# Patient Record
Sex: Male | Born: 1960 | Race: Black or African American | Hispanic: No | Marital: Single | Smoking: Never smoker
Health system: Southern US, Community
[De-identification: ages and names within clinical notes are randomized; demographics above are authoritative.]

## PROBLEM LIST (undated history)

## (undated) ENCOUNTER — Emergency Department (HOSPITAL_COMMUNITY): Admission: EM | Payer: Self-pay

## (undated) DIAGNOSIS — E1169 Type 2 diabetes mellitus with other specified complication: Secondary | ICD-10-CM

## (undated) DIAGNOSIS — E669 Obesity, unspecified: Secondary | ICD-10-CM

## (undated) DIAGNOSIS — R601 Generalized edema: Secondary | ICD-10-CM

## (undated) DIAGNOSIS — E11621 Type 2 diabetes mellitus with foot ulcer: Secondary | ICD-10-CM

## (undated) DIAGNOSIS — L0390 Cellulitis, unspecified: Secondary | ICD-10-CM

## (undated) DIAGNOSIS — F1010 Alcohol abuse, uncomplicated: Secondary | ICD-10-CM

## (undated) DIAGNOSIS — L089 Local infection of the skin and subcutaneous tissue, unspecified: Secondary | ICD-10-CM

## (undated) DIAGNOSIS — I10 Essential (primary) hypertension: Secondary | ICD-10-CM

---

## 1999-05-07 ENCOUNTER — Emergency Department (HOSPITAL_COMMUNITY): Admission: EM | Admit: 1999-05-07 | Discharge: 1999-05-08 | Payer: Self-pay | Admitting: Emergency Medicine

## 1999-05-08 ENCOUNTER — Emergency Department (HOSPITAL_COMMUNITY): Admission: EM | Admit: 1999-05-08 | Discharge: 1999-05-08 | Payer: Self-pay | Admitting: Emergency Medicine

## 1999-07-21 ENCOUNTER — Emergency Department (HOSPITAL_COMMUNITY): Admission: EM | Admit: 1999-07-21 | Discharge: 1999-07-21 | Payer: Self-pay | Admitting: Emergency Medicine

## 1999-07-22 ENCOUNTER — Emergency Department (HOSPITAL_COMMUNITY): Admission: EM | Admit: 1999-07-22 | Discharge: 1999-07-22 | Payer: Self-pay | Admitting: Emergency Medicine

## 1999-07-26 ENCOUNTER — Emergency Department (HOSPITAL_COMMUNITY): Admission: EM | Admit: 1999-07-26 | Discharge: 1999-07-26 | Payer: Self-pay | Admitting: Emergency Medicine

## 1999-07-28 ENCOUNTER — Emergency Department (HOSPITAL_COMMUNITY): Admission: EM | Admit: 1999-07-28 | Discharge: 1999-07-29 | Payer: Self-pay | Admitting: Emergency Medicine

## 1999-07-29 ENCOUNTER — Encounter: Payer: Self-pay | Admitting: Emergency Medicine

## 2000-11-19 ENCOUNTER — Emergency Department (HOSPITAL_COMMUNITY): Admission: EM | Admit: 2000-11-19 | Discharge: 2000-11-19 | Payer: Self-pay

## 2000-11-22 ENCOUNTER — Emergency Department (HOSPITAL_COMMUNITY): Admission: EM | Admit: 2000-11-22 | Discharge: 2000-11-22 | Payer: Self-pay | Admitting: Emergency Medicine

## 2000-12-18 ENCOUNTER — Emergency Department (HOSPITAL_COMMUNITY): Admission: EM | Admit: 2000-12-18 | Discharge: 2000-12-18 | Payer: Self-pay | Admitting: Emergency Medicine

## 2001-03-19 ENCOUNTER — Emergency Department (HOSPITAL_COMMUNITY): Admission: EM | Admit: 2001-03-19 | Discharge: 2001-03-19 | Payer: Self-pay | Admitting: Emergency Medicine

## 2001-03-20 ENCOUNTER — Encounter: Payer: Self-pay | Admitting: Internal Medicine

## 2001-03-20 ENCOUNTER — Emergency Department (HOSPITAL_COMMUNITY): Admission: EM | Admit: 2001-03-20 | Discharge: 2001-03-20 | Payer: Self-pay | Admitting: Internal Medicine

## 2001-04-18 ENCOUNTER — Emergency Department (HOSPITAL_COMMUNITY): Admission: EM | Admit: 2001-04-18 | Discharge: 2001-04-18 | Payer: Self-pay | Admitting: Emergency Medicine

## 2001-04-20 ENCOUNTER — Emergency Department (HOSPITAL_COMMUNITY): Admission: EM | Admit: 2001-04-20 | Discharge: 2001-04-20 | Payer: Self-pay | Admitting: Emergency Medicine

## 2005-12-20 ENCOUNTER — Emergency Department (HOSPITAL_COMMUNITY): Admission: EM | Admit: 2005-12-20 | Discharge: 2005-12-20 | Payer: Self-pay | Admitting: Emergency Medicine

## 2005-12-27 ENCOUNTER — Emergency Department (HOSPITAL_COMMUNITY): Admission: EM | Admit: 2005-12-27 | Discharge: 2005-12-27 | Payer: Self-pay | Admitting: *Deleted

## 2010-06-15 ENCOUNTER — Emergency Department (HOSPITAL_COMMUNITY): Admission: EM | Admit: 2010-06-15 | Discharge: 2010-06-15 | Payer: Self-pay | Admitting: Emergency Medicine

## 2010-06-15 IMAGING — CR DG FINGER THUMB 2+V*R*
3 series · 3 of 3 positions shown · non-contrast
Comparison: None

CLINICAL DATA: Swelling right thumb

RIGHT THUMB 2+V

[x finger pa right]
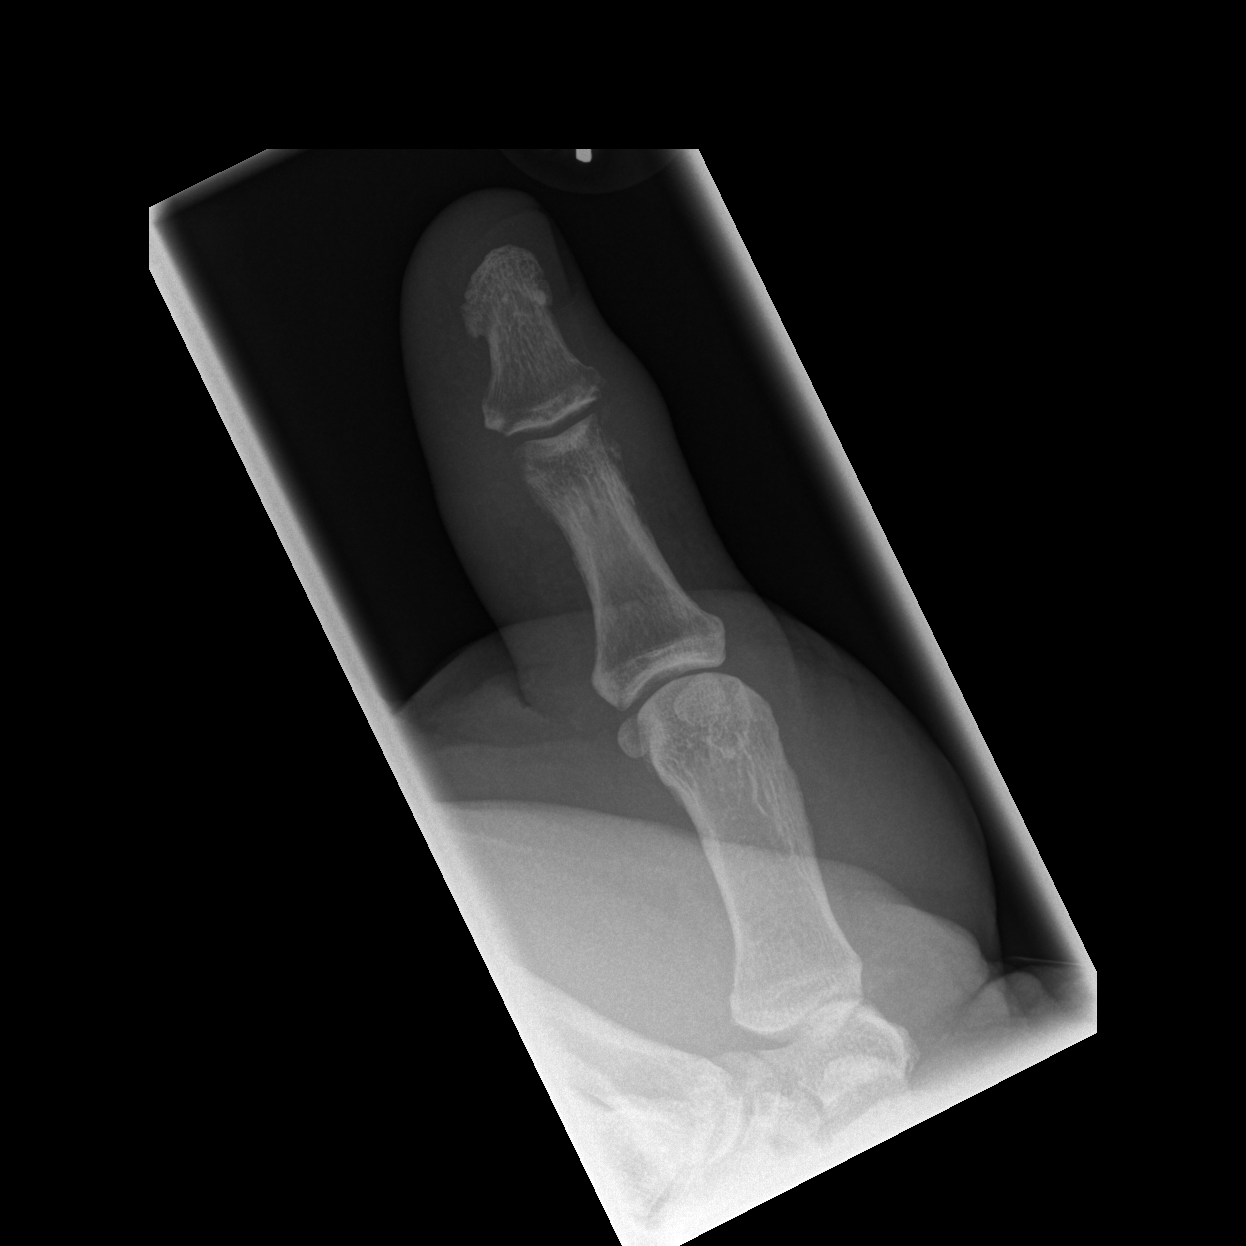

[x finger obl. right]
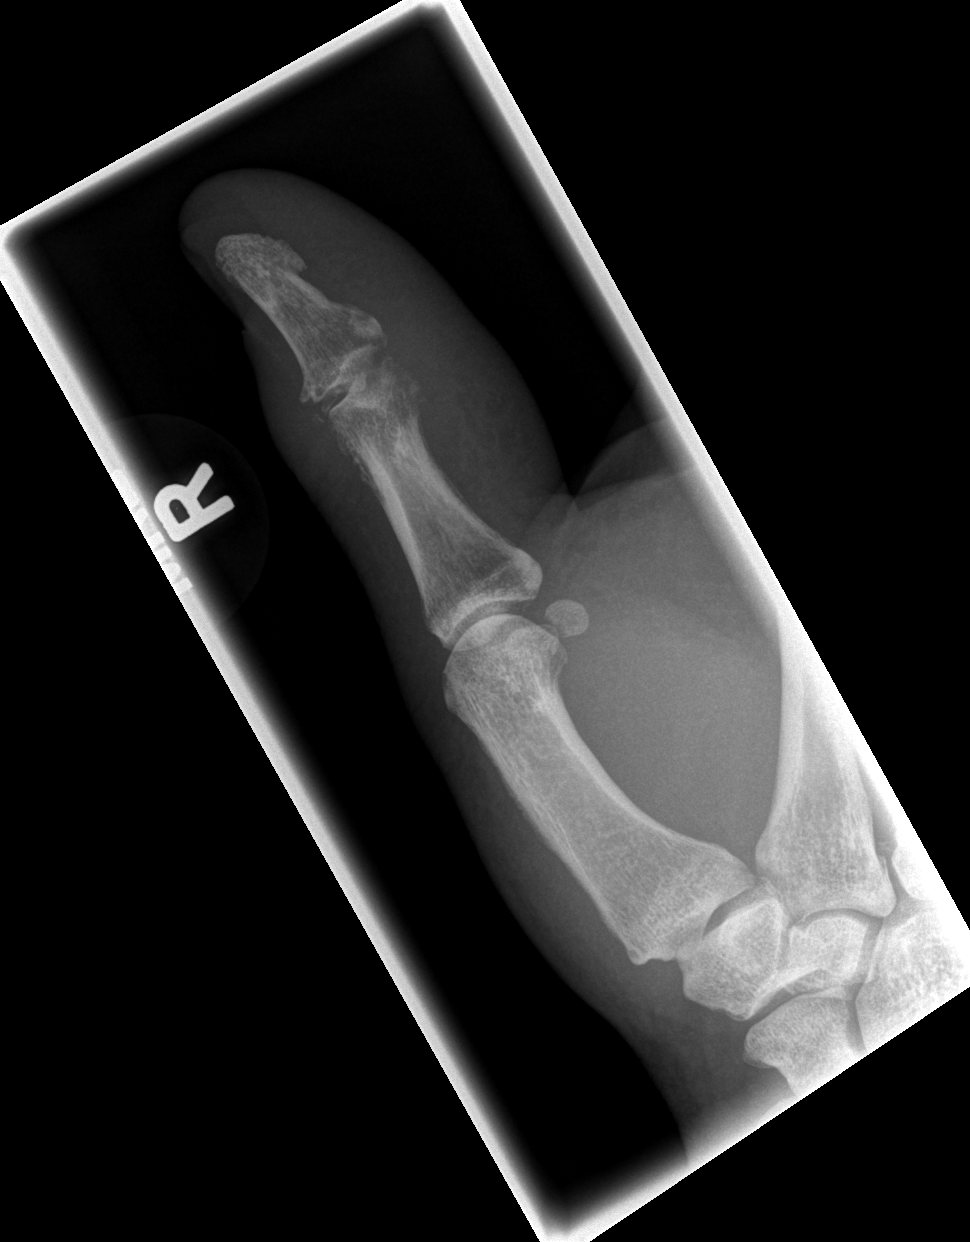

[x finger lateral right]
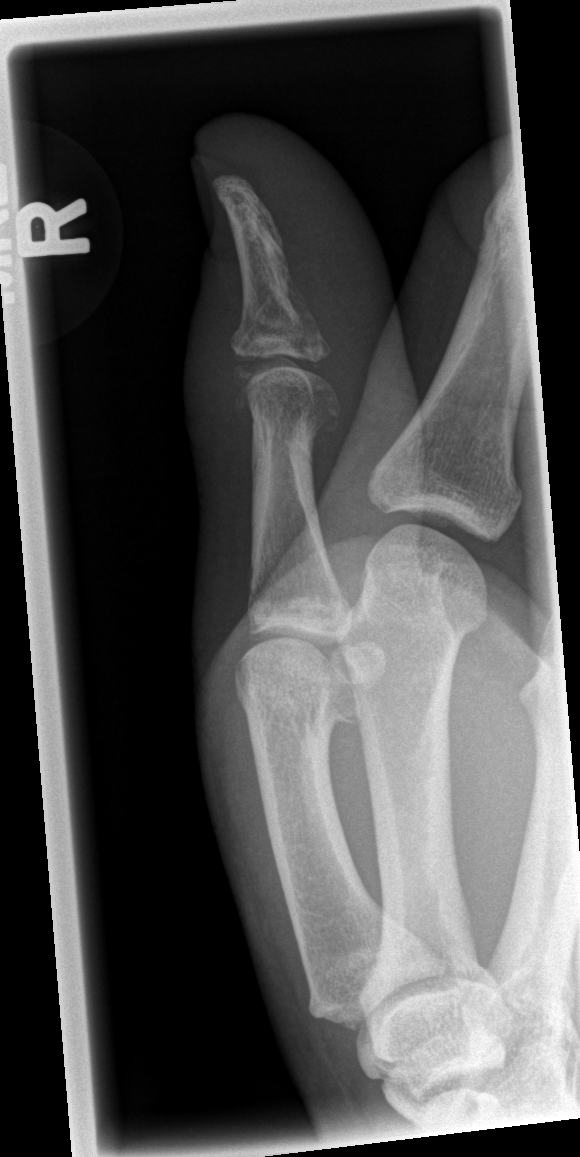

[3 of 3 positions shown; findings below may reference images not displayed]

FINDINGS: Joint spaces preserved.
Small ossicles versus loose bodies at IP joint right thumb.
No acute fracture or dislocation.
On the oblique view, there is questionable loss of the cortical
margin at the ulnar aspect at the head of the proximal phalanx:
osteomyelitis / bone destruction not excluded.
Soft tissue swelling right thumb, greatest at IP joint.
IMPRESSION: Soft tissue swelling and degenerative changes IP joint right thumb.
Cannot exclude osteomyelitis at head of proximal phalanx right
thumb; consider follow-up MR imaging.

## 2011-03-30 ENCOUNTER — Emergency Department (HOSPITAL_COMMUNITY)
Admission: EM | Admit: 2011-03-30 | Discharge: 2011-03-30 | Discharge status: Against Medical Advice | Payer: Self-pay | Attending: Emergency Medicine | Admitting: Emergency Medicine

## 2011-03-31 ENCOUNTER — Emergency Department (HOSPITAL_COMMUNITY): Payer: Self-pay

## 2011-03-31 ENCOUNTER — Emergency Department (HOSPITAL_COMMUNITY)
Admission: EM | Admit: 2011-03-31 | Discharge: 2011-03-31 | Disposition: A | Discharge status: Home / Self Care | Payer: Self-pay | Attending: Emergency Medicine | Admitting: Emergency Medicine

## 2011-03-31 DIAGNOSIS — M7020 Olecranon bursitis, unspecified elbow: Secondary | ICD-10-CM | POA: Insufficient documentation

## 2011-03-31 DIAGNOSIS — R609 Edema, unspecified: Secondary | ICD-10-CM | POA: Insufficient documentation

## 2011-03-31 IMAGING — CR DG ELBOW COMPLETE 3+V*R*
4 series · 4 of 4 positions shown · non-contrast
Comparison: None.

CLINICAL DATA: Severe elbow pain and swelling for 5 days.  No acute
injury.

RIGHT ELBOW - COMPLETE 3+ VIEW

[x elbow joint ap right]
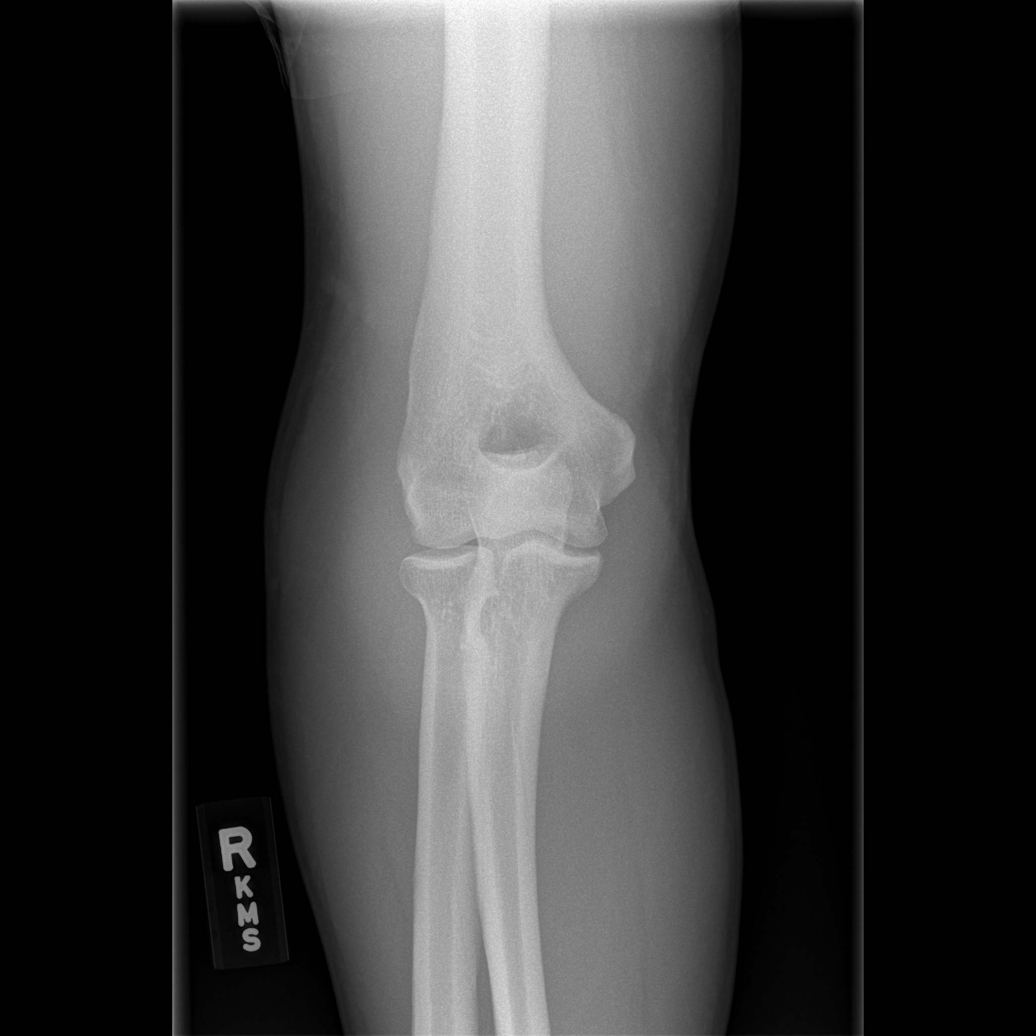

[x elbow joint obl. right]
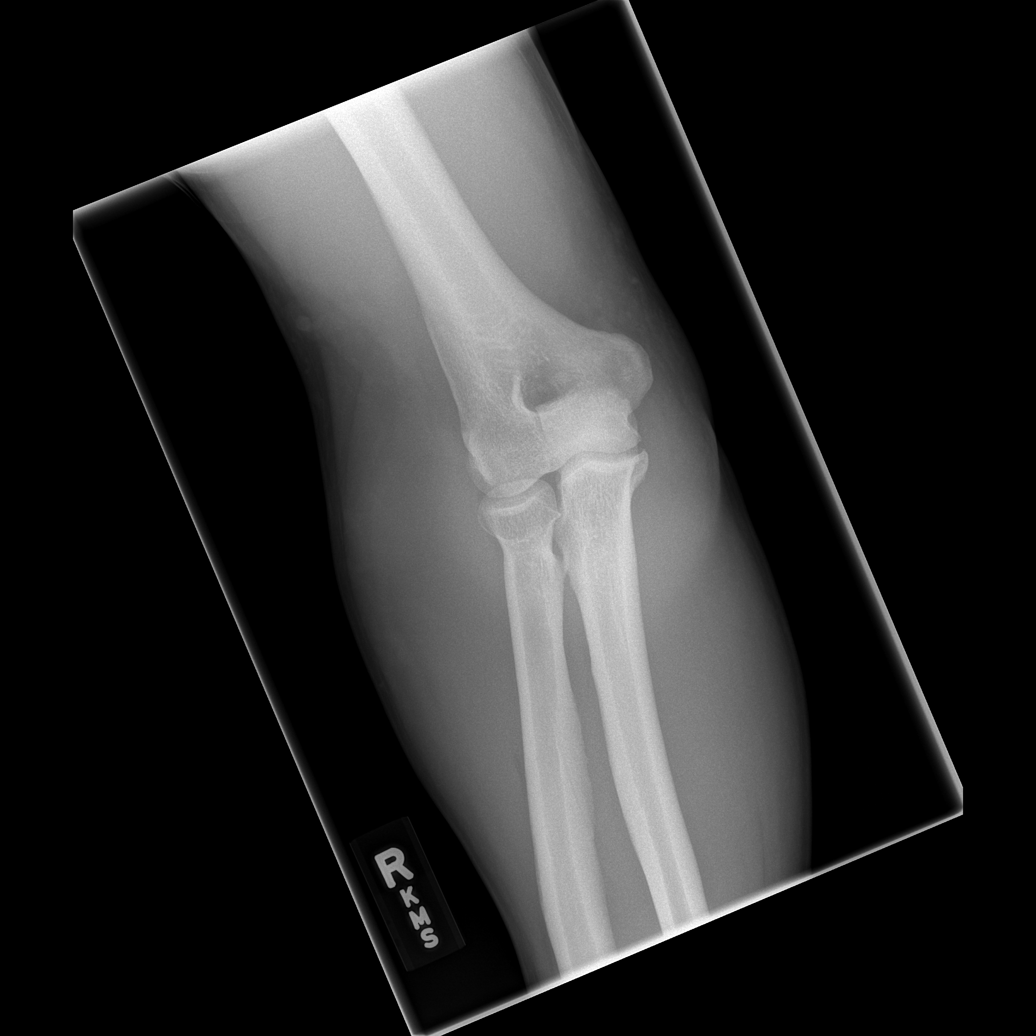

[x elbow joint lat right (1 of 2)]
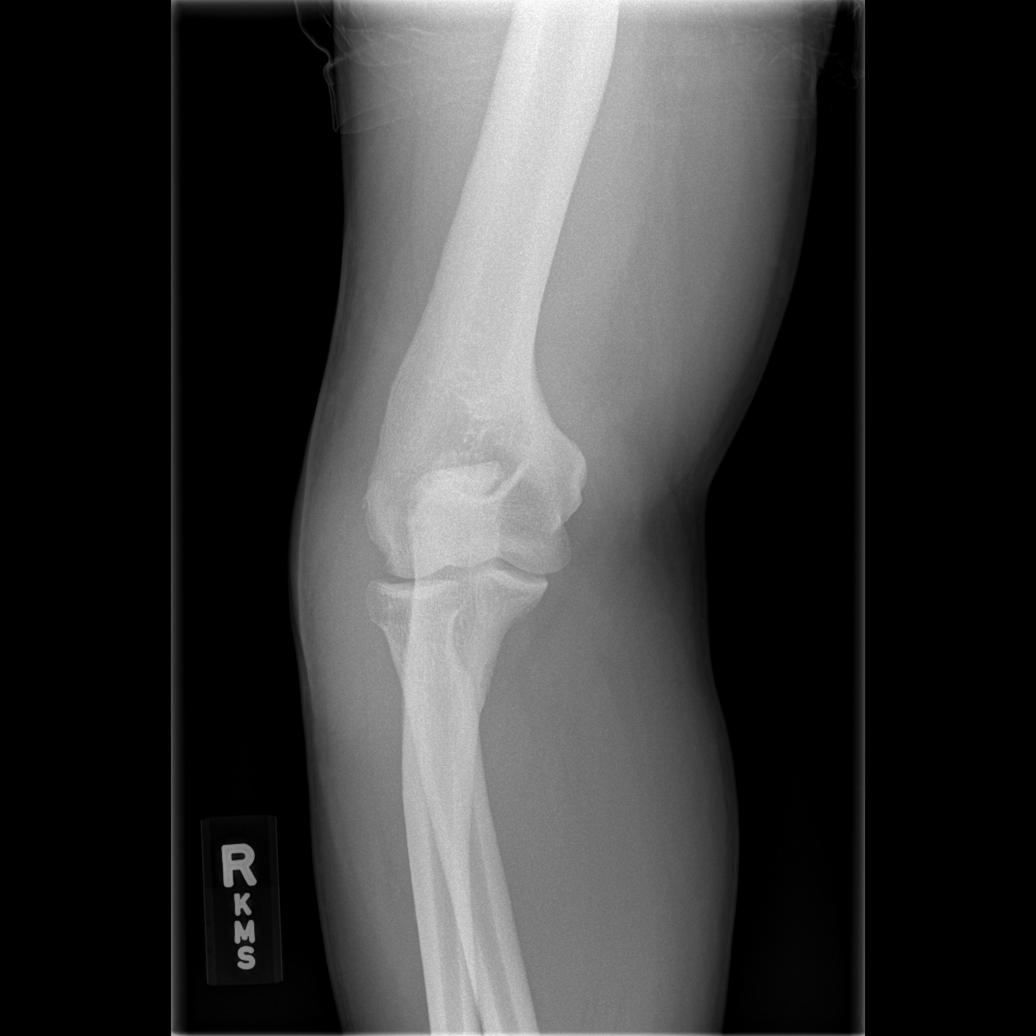

[x elbow joint lat right (2 of 2)]
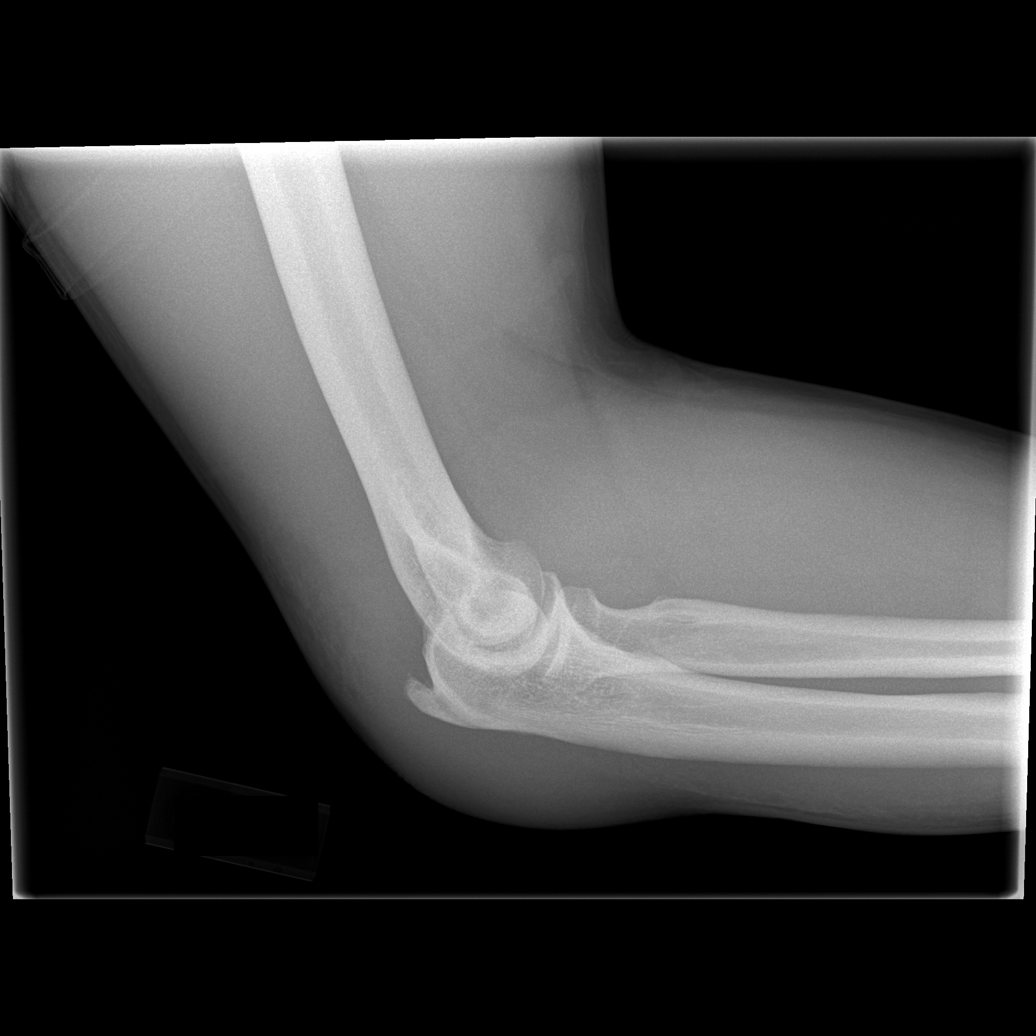

[4 of 4 positions shown; findings below may reference images not displayed]

FINDINGS: There is marked soft tissue swelling dorsally over the
olecranon process.  There is prominent spurring at the triceps
insertion on the olecranon.  No acute fracture, dislocation or
significant elbow joint effusion is identified.  There is mild
spurring medially.
IMPRESSION: 1.  Marked soft tissue swelling over the olecranon process
suspicious for bursitis, possibly related to gout.  Correlate
clinically.
2.  No evidence of acute fracture, dislocation or elbow joint
effusion.

## 2012-05-19 ENCOUNTER — Encounter (HOSPITAL_COMMUNITY): Payer: Self-pay | Admitting: *Deleted

## 2012-05-19 ENCOUNTER — Emergency Department (HOSPITAL_COMMUNITY)
Admission: EM | Admit: 2012-05-19 | Discharge: 2012-05-19 | Disposition: A | Discharge status: Home / Self Care | Payer: Self-pay | Attending: Emergency Medicine | Admitting: Emergency Medicine

## 2012-05-19 DIAGNOSIS — M7989 Other specified soft tissue disorders: Secondary | ICD-10-CM | POA: Insufficient documentation

## 2012-05-19 NOTE — ED Notes (Signed)
Pt c/o bilateral feet being swollen x 1 wk.  Denies shortness of breath; c/o pain all the time in feet.  States previous history of same in elbow a year ago

## 2012-05-19 NOTE — ED Notes (Signed)
Attempted to call patient from waiting room to room in ER, but patient does not answer.

## 2012-05-19 NOTE — ED Notes (Signed)
Patient called x2 from lobby without response. Will call again

## 2013-02-07 ENCOUNTER — Encounter (HOSPITAL_COMMUNITY): Payer: Self-pay | Admitting: Emergency Medicine

## 2013-02-07 ENCOUNTER — Emergency Department (HOSPITAL_COMMUNITY)
Admission: EM | Admit: 2013-02-07 | Discharge: 2013-02-07 | Disposition: A | Discharge status: Home / Self Care | Payer: No Typology Code available for payment source | Attending: Emergency Medicine | Admitting: Emergency Medicine

## 2013-02-07 ENCOUNTER — Emergency Department (HOSPITAL_COMMUNITY): Payer: No Typology Code available for payment source

## 2013-02-07 DIAGNOSIS — Y92410 Unspecified street and highway as the place of occurrence of the external cause: Secondary | ICD-10-CM | POA: Insufficient documentation

## 2013-02-07 DIAGNOSIS — S99919A Unspecified injury of unspecified ankle, initial encounter: Secondary | ICD-10-CM | POA: Insufficient documentation

## 2013-02-07 DIAGNOSIS — S8990XA Unspecified injury of unspecified lower leg, initial encounter: Secondary | ICD-10-CM | POA: Insufficient documentation

## 2013-02-07 DIAGNOSIS — Y9389 Activity, other specified: Secondary | ICD-10-CM | POA: Insufficient documentation

## 2013-02-07 DIAGNOSIS — T148XXA Other injury of unspecified body region, initial encounter: Secondary | ICD-10-CM

## 2013-02-07 DIAGNOSIS — IMO0002 Reserved for concepts with insufficient information to code with codable children: Secondary | ICD-10-CM | POA: Insufficient documentation
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
## 2013-02-07 MED ORDER — METHOCARBAMOL 500 MG PO TABS: 500.0000 mg | ORAL_TABLET | Freq: Two times a day (BID) | ORAL | Status: DC

## 2013-02-07 MED ORDER — IBUPROFEN 600 MG PO TABS: 600.0000 mg | ORAL_TABLET | Freq: Four times a day (QID) | ORAL | Status: DC | PRN

## 2013-02-07 NOTE — ED Notes (Signed)
Pt discharged to home with familly. NAD.
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
## 2014-01-25 ENCOUNTER — Emergency Department (HOSPITAL_COMMUNITY)
Admission: EM | Admit: 2014-01-25 | Discharge: 2014-01-25 | Disposition: A | Discharge status: Home / Self Care | Payer: Self-pay | Attending: Emergency Medicine | Admitting: Emergency Medicine

## 2014-01-25 ENCOUNTER — Encounter (HOSPITAL_COMMUNITY): Payer: Self-pay | Admitting: Emergency Medicine

## 2014-01-25 DIAGNOSIS — K0889 Other specified disorders of teeth and supporting structures: Secondary | ICD-10-CM

## 2014-01-25 DIAGNOSIS — K029 Dental caries, unspecified: Secondary | ICD-10-CM | POA: Insufficient documentation

## 2014-01-25 DIAGNOSIS — R220 Localized swelling, mass and lump, head: Secondary | ICD-10-CM | POA: Insufficient documentation

## 2014-01-25 DIAGNOSIS — K044 Acute apical periodontitis of pulpal origin: Secondary | ICD-10-CM | POA: Insufficient documentation

## 2014-01-25 DIAGNOSIS — R221 Localized swelling, mass and lump, neck: Secondary | ICD-10-CM

## 2014-01-25 DIAGNOSIS — K047 Periapical abscess without sinus: Secondary | ICD-10-CM

## 2014-01-25 MED ORDER — IBUPROFEN 400 MG PO TABS
800.0000 mg | ORAL_TABLET | Freq: Once | ORAL | Status: AC
  Administered 2014-01-25: 800 mg via ORAL
  Filled 2014-01-25: qty 2

## 2014-01-25 MED ORDER — BENZOCAINE 10 % MT GEL: 1.0000 "application " | OROMUCOSAL | Status: DC | PRN

## 2014-01-25 MED ORDER — LIDOCAINE VISCOUS 2 % MT SOLN: 15.0000 mL | Freq: Four times a day (QID) | OROMUCOSAL | Status: DC | PRN

## 2014-01-25 MED ORDER — LIDOCAINE VISCOUS 2 % MT SOLN
15.0000 mL | Freq: Once | OROMUCOSAL | Status: AC
  Administered 2014-01-25: 15 mL via OROMUCOSAL
  Filled 2014-01-25: qty 15

## 2014-01-25 MED ORDER — PENICILLIN V POTASSIUM 500 MG PO TABS: 500.0000 mg | ORAL_TABLET | Freq: Four times a day (QID) | ORAL | Status: DC

## 2014-01-25 MED ORDER — IBUPROFEN 800 MG PO TABS: 800.0000 mg | ORAL_TABLET | Freq: Three times a day (TID) | ORAL | Status: DC

## 2014-01-25 NOTE — ED Notes (Signed)
1 month tooth pain; swelling x 2 days.

## 2014-01-25 NOTE — ED Notes (Signed)
Dental pain on lower right side for about a month. Approximately 2 days ago patient noticed swelling had started in lower right jaw. Currently right side of face noticeably swollen compared to left.

## 2014-01-25 NOTE — ED Provider Notes (Signed)
CSN: [id number]     Arrival date & time 01/25/14  1923 History   First MD Initiated Contact with Patient 01/25/14 2033     Chief Complaint  Patient presents with  . Dental Pain     (Consider location/radiation/quality/duration/timing/severity/associated sxs/prior Treatment) HPI Comments: Patient is a 53 yo M presenting to the ED for one month of right lower dental pain that he describes as moderate sharp throbbing in nature without radiation. Patient states he has had an increase in his pain with some mild associated right lower dental swelling without drainage. Patient states he went to the dentist office today but was told he could not be seen until next week at his appointment. Patient has not taken any medications for his dental pain. Denies any fevers, chills, nausea, vomiting, difficulty swallowing, SOB, oropharyngeal swelling, purulent drainage from tooth.    History reviewed. No pertinent past medical history. History reviewed. No pertinent past surgical history. History reviewed. No pertinent family history. History  Substance Use Topics  . Smoking status: Never Smoker   . Smokeless tobacco: Never Used  . Alcohol Use: 1.2 oz/week    2 Cans of beer per week     Comment: Occasional    Review of Systems  Constitutional: Negative for fever and chills.  HENT: Positive for dental problem and facial swelling (right lower jaw). Negative for drooling and trouble swallowing.   Respiratory: Negative for shortness of breath.   All other systems reviewed and are negative.      Allergies  Review of patient's allergies indicates no known allergies.  Home Medications   Current Outpatient Rx  Name  Route  Sig  Dispense  Refill  . benzocaine (ORAJEL) 10 % mucosal gel   Mouth/Throat   Use as directed 1 application in the mouth or throat as needed for mouth pain.   5.3 g   0   . ibuprofen (ADVIL,MOTRIN) 800 MG tablet   Oral   Take 1 tablet (800 mg total) by mouth 3 (three)  times daily.   21 tablet   0   . lidocaine (XYLOCAINE) 2 % solution   Mouth/Throat   Use as directed 15 mLs in the mouth or throat every 6 (six) hours as needed for mouth pain.   100 mL   0   . penicillin v potassium (VEETID) 500 MG tablet   Oral   Take 1 tablet (500 mg total) by mouth 4 (four) times daily.   40 tablet   0    BP 152/97  Pulse 94  Temp(Src) 98.8 F (37.1 C) (Oral)  Resp 20  Ht 5\' 11"  (1.803 m)  Wt 245 lb 6 oz (111.301 kg)  BMI 34.24 kg/m2  SpO2 96% Physical Exam  Nursing note and vitals reviewed. Constitutional: He is oriented to person, place, and time. He appears well-developed and well-nourished. No distress.  HENT:  Head: Normocephalic and atraumatic.  Right Ear: External ear normal.  Left Ear: External ear normal.  Nose: Nose normal.  Mouth/Throat: Uvula is midline, oropharynx is clear and moist and mucous membranes are normal. No oral lesions. No trismus in the jaw. Abnormal dentition. Dental caries present. No uvula swelling. No oropharyngeal exudate.  Eyes: Conjunctivae are normal.  Neck: Normal range of motion. Neck supple.  Cardiovascular: Normal rate, regular rhythm and normal heart sounds.   Pulmonary/Chest: Effort normal and breath sounds normal. No stridor. No respiratory distress.  Musculoskeletal: Normal range of motion.  Lymphadenopathy:    He has no  cervical adenopathy.  Neurological: He is alert and oriented to person, place, and time.  Skin: Skin is warm and dry. He is not diaphoretic.  Psychiatric: He has a normal mood and affect.    ED Course  Procedures (including critical care time) Medications  lidocaine (XYLOCAINE) 2 % viscous mouth solution 15 mL (15 mLs Mouth/Throat Given 01/25/14 2101)  ibuprofen (ADVIL,MOTRIN) tablet 800 mg (800 mg Oral Given 01/25/14 2100)    Labs Review Labs Reviewed - No data to display Imaging Review No results found.   EKG Interpretation None      MDM   Final diagnoses:  Pain, dental   Dental infection   Filed Vitals:   01/25/14 2107  BP: 152/97  Pulse: 94  Temp:   Resp: 20    Afebrile, NAD, non-toxic appearing, AAOx4.   Patient with toothache.  No gross abscess.  Exam unconcerning for Ludwig's angina. Patient likely with small periapical abscess, no indication for draining here. Has f/u next week. Will treat with penicillin and pain medicine.  Urged patient to follow-up with dentist. Also discussed Patient is stable at time of discharge.      Jeannetta Ellis, PA-C 01/25/14 2201

## 2014-01-25 NOTE — ED Notes (Signed)
PT ambulated with baseline gait; VSS; A&Ox3; no signs of distress; respirations even and unlabored; skin warm and dry; no questions upon discharge.  

## 2014-01-25 NOTE — Discharge Instructions (Signed)
Please call one of the dentists on the list below. Please take your antibiotic until completion. Please take all the medications as prescribed. Please read all discharge instructions and return precautions.    Abscessed Tooth An abscessed tooth is an infection around your tooth. It may be caused by holes or damage to the tooth (cavity) or a dental disease. An abscessed tooth causes mild to very bad pain in and around the tooth. See your dentist right away if you have tooth or gum pain. HOME CARE  Take your medicine as told. Finish it even if you start to feel better.  Do not drive after taking pain medicine.  Rinse your mouth (gargle) often with salt water ( teaspoon salt in 8 ounces of warm water).  Do not apply heat to the outside of your face. GET HELP RIGHT AWAY IF:   You have a temperature by mouth above 102 F (38.9 C), not controlled by medicine.  You have chills and a very bad headache.  You have problems breathing or swallowing.  Your mouth will not open.  You develop puffiness (swelling) on the neck or around the eye.  Your pain is not helped by medicine.  Your pain is getting worse instead of better. MAKE SURE YOU:   Understand these instructions.  Will watch your condition.  Will get help right away if you are not doing well or get worse. Document Released: 04/15/2008 Document Revised: 01/20/2012 Document Reviewed: 02/05/2011 [facility] Patient Information 2014 [employer] [place]  RESOURCE GUIDE  If you do not have a primary care doctor to follow up with regarding today's visit, please call the [facility] at [phone] to make an appointment. Hours of operation are 10am - 7pm, Monday through Friday, and they have a sliding scale fee.    Dental Assistance Please contact the on-call dentist listed on your discharge papers WITHIN 48 HOURS.  If the on-call dentist agrees that your condition is emergent, there is a CHANCE (not a guarantee) that  you MAY receive a savings on your visit at this point. If you wait more than 48 hours, it will not be considered an emergency.   Drs. Moreen Fowleravid and Janna Civils:  [address], [place], [place], [address], [phone]  Short-notice availability  Tooth evaluation: $100  Emergency Treatment: $200 (including exam, xrays, tooth extraction, and post-op visit)  Patients with Medicaid: [facility] [phone], [phone] [address], [phone]  If unable to pay, or uninsured, contact [facility] [phone] in [place], [phone] in [facility]) to become qualified for the adult dental clinic  Other Low-Cost Community Dental Services: - [facility]: [address], [place], [place], [address], [phone], [phone], 2nd and 4th Thursday of the month at 6:30am.  10 clients each day by appointment, can sometimes see walk-in patients if someone does not show for an appointment. [facility]:  [address], [place], [place], [address], [phone] [facility]:  [address], [place], [place], [address], [phone] [facility]:  [phone] [facility]:  [phone] [facility]:  [phone]

## 2014-01-26 ENCOUNTER — Emergency Department (HOSPITAL_COMMUNITY): Payer: Self-pay

## 2014-01-26 ENCOUNTER — Inpatient Hospital Stay (HOSPITAL_COMMUNITY)
Admission: EM | Admit: 2014-01-26 | Discharge: 2014-01-31 | DRG: 603 | Disposition: A | Discharge status: Home / Self Care | Payer: Self-pay | Attending: Internal Medicine | Admitting: Internal Medicine

## 2014-01-26 ENCOUNTER — Encounter (HOSPITAL_COMMUNITY): Payer: Self-pay | Admitting: Emergency Medicine

## 2014-01-26 DIAGNOSIS — IMO0001 Reserved for inherently not codable concepts without codable children: Secondary | ICD-10-CM | POA: Diagnosis present

## 2014-01-26 DIAGNOSIS — E876 Hypokalemia: Secondary | ICD-10-CM | POA: Diagnosis present

## 2014-01-26 DIAGNOSIS — D72829 Elevated white blood cell count, unspecified: Secondary | ICD-10-CM

## 2014-01-26 DIAGNOSIS — L0291 Cutaneous abscess, unspecified: Secondary | ICD-10-CM | POA: Diagnosis present

## 2014-01-26 DIAGNOSIS — IMO0002 Reserved for concepts with insufficient information to code with codable children: Secondary | ICD-10-CM | POA: Diagnosis present

## 2014-01-26 DIAGNOSIS — R739 Hyperglycemia, unspecified: Secondary | ICD-10-CM

## 2014-01-26 DIAGNOSIS — K044 Acute apical periodontitis of pulpal origin: Secondary | ICD-10-CM | POA: Diagnosis present

## 2014-01-26 DIAGNOSIS — E1165 Type 2 diabetes mellitus with hyperglycemia: Secondary | ICD-10-CM | POA: Diagnosis present

## 2014-01-26 DIAGNOSIS — L0390 Cellulitis, unspecified: Secondary | ICD-10-CM

## 2014-01-26 DIAGNOSIS — L03211 Cellulitis of face: Secondary | ICD-10-CM

## 2014-01-26 DIAGNOSIS — M264 Malocclusion, unspecified: Secondary | ICD-10-CM | POA: Diagnosis present

## 2014-01-26 DIAGNOSIS — L0201 Cutaneous abscess of face: Principal | ICD-10-CM | POA: Diagnosis present

## 2014-01-26 DIAGNOSIS — K047 Periapical abscess without sinus: Secondary | ICD-10-CM

## 2014-01-26 DIAGNOSIS — K036 Deposits [accretions] on teeth: Secondary | ICD-10-CM | POA: Diagnosis present

## 2014-01-26 DIAGNOSIS — K029 Dental caries, unspecified: Secondary | ICD-10-CM | POA: Diagnosis present

## 2014-01-26 DIAGNOSIS — E119 Type 2 diabetes mellitus without complications: Secondary | ICD-10-CM | POA: Diagnosis present

## 2014-01-26 DIAGNOSIS — K083 Retained dental root: Secondary | ICD-10-CM | POA: Diagnosis present

## 2014-01-26 DIAGNOSIS — K045 Chronic apical periodontitis: Secondary | ICD-10-CM | POA: Diagnosis present

## 2014-01-26 DIAGNOSIS — D696 Thrombocytopenia, unspecified: Secondary | ICD-10-CM | POA: Diagnosis present

## 2014-01-26 DIAGNOSIS — N179 Acute kidney failure, unspecified: Secondary | ICD-10-CM | POA: Diagnosis present

## 2014-01-26 HISTORY — DX: Periapical abscess without sinus: K04.7

## 2014-01-26 HISTORY — DX: Cellulitis of face: L03.211

## 2014-01-26 HISTORY — DX: Dental caries, unspecified: K02.9

## 2014-01-26 HISTORY — DX: Cutaneous abscess, unspecified: L02.91

## 2014-01-26 LAB — CBC WITH DIFFERENTIAL/PLATELET
Basophils Absolute: 0 10*3/uL (ref 0.0–0.1)
Basophils Relative: 0 % (ref 0–1)
EOS ABS: 0 10*3/uL (ref 0.0–0.7)
Eosinophils Relative: 0 % (ref 0–5)
HEMATOCRIT: 44.2 % (ref 39.0–52.0)
HEMOGLOBIN: 15.8 g/dL (ref 13.0–17.0)
LYMPHS ABS: 0.7 10*3/uL (ref 0.7–4.0)
Lymphocytes Relative: 5 % — ABNORMAL LOW (ref 12–46)
MCH: 34 pg (ref 26.0–34.0)
MCHC: 35.7 g/dL (ref 30.0–36.0)
MCV: 95.1 fL (ref 78.0–100.0)
MONO ABS: 1.3 10*3/uL — AB (ref 0.1–1.0)
MONOS PCT: 10 % (ref 3–12)
NEUTROS PCT: 85 % — AB (ref 43–77)
Neutro Abs: 11.4 10*3/uL — ABNORMAL HIGH (ref 1.7–7.7)
Platelets: 165 10*3/uL (ref 150–400)
RBC: 4.65 MIL/uL (ref 4.22–5.81)
RDW: 12.6 % (ref 11.5–15.5)
WBC: 13.5 10*3/uL — ABNORMAL HIGH (ref 4.0–10.5)

## 2014-01-26 LAB — I-STAT CHEM 8, ED
BUN: 11 mg/dL (ref 6–23)
CHLORIDE: 98 meq/L (ref 96–112)
CREATININE: 1.3 mg/dL (ref 0.50–1.35)
Calcium, Ion: 1.19 mmol/L (ref 1.12–1.23)
GLUCOSE: 241 mg/dL — AB (ref 70–99)
HCT: 49 % (ref 39.0–52.0)
HEMOGLOBIN: 16.7 g/dL (ref 13.0–17.0)
POTASSIUM: 4 meq/L (ref 3.7–5.3)
SODIUM: 137 meq/L (ref 137–147)
TCO2: 26 mmol/L (ref 0–100)

## 2014-01-26 IMAGING — CT CT NECK W/ CM
3 of 4 series · 16 of 33 positions shown, 19 images · IV contrast (CONTRAST)
Comparison: None.

CLINICAL DATA: Dental pain of 1 month duration. Right-sided facial
swelling. Fever.

EXAM:
CT NECK WITH CONTRAST
TECHNIQUE: Multidetector CT imaging of the neck was performed using the
standard protocol following the bolus administration of intravenous
contrast.
CONTRAST:  100mL OMNIPAQUE IOHEXOL 300 MG/ML  SOLN

[Series 2: soft tissue · axial · 0.51mm/px · z∈[-43,+153]mm · 8 of 126 slices shown, 10 images]
[im 14/126  soft-tissue]
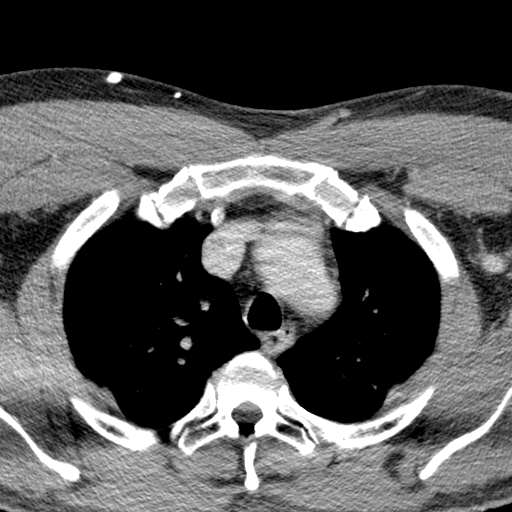
[im 14/126  bone]
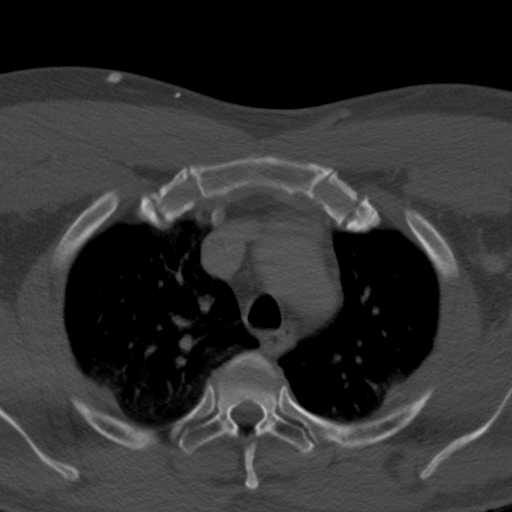
[im 28/126  bone]
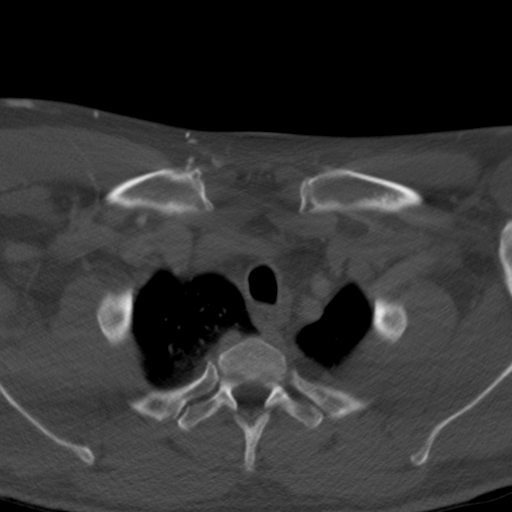
[im 42/126  bone]
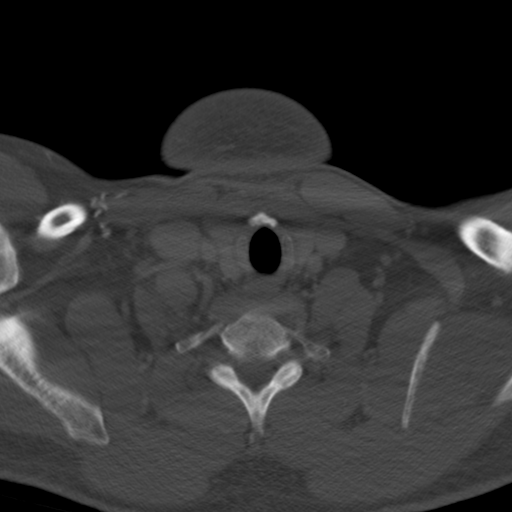
[im 56/126  bone]
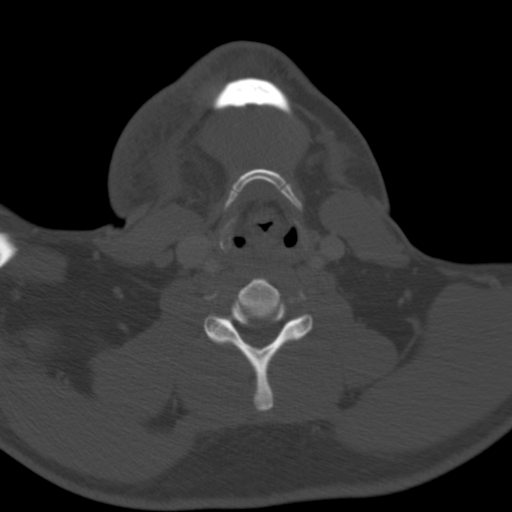
[im 70/126  soft-tissue]
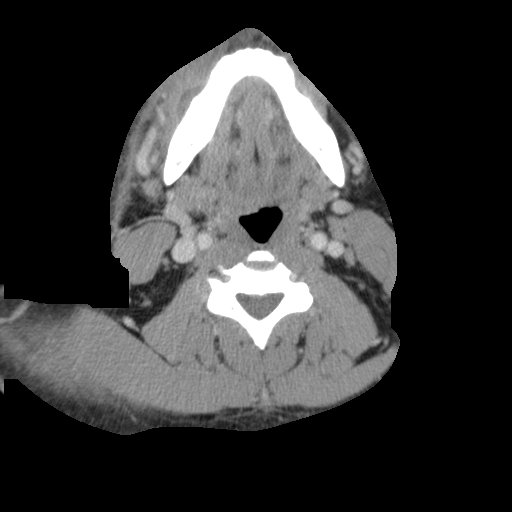
[im 70/126  bone]
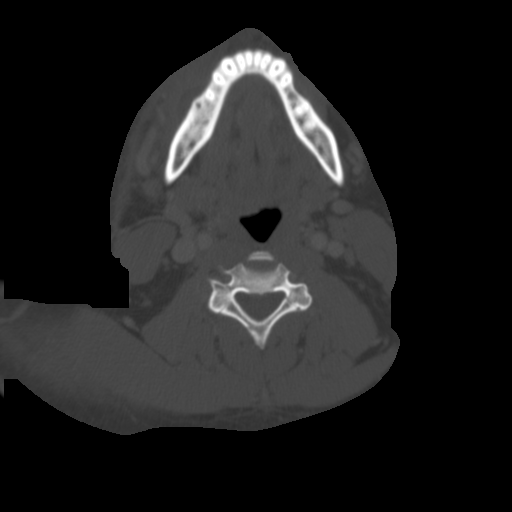
[im 84/126  bone]
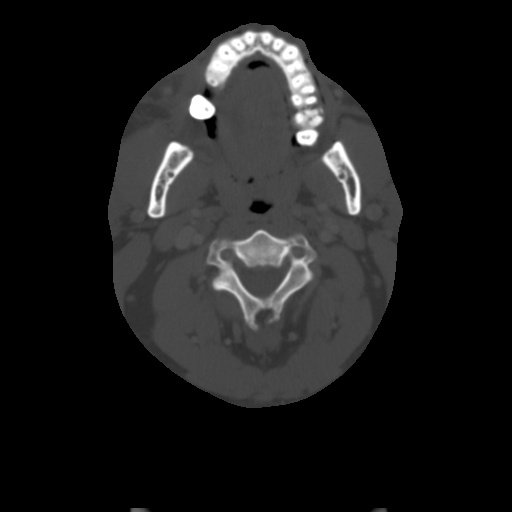
[im 98/126  bone]
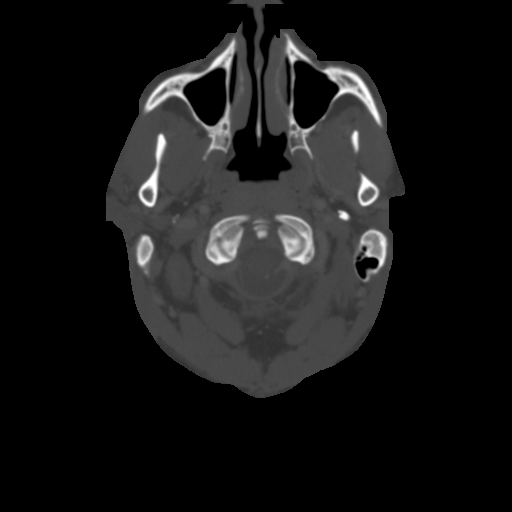
[im 112/126  bone]
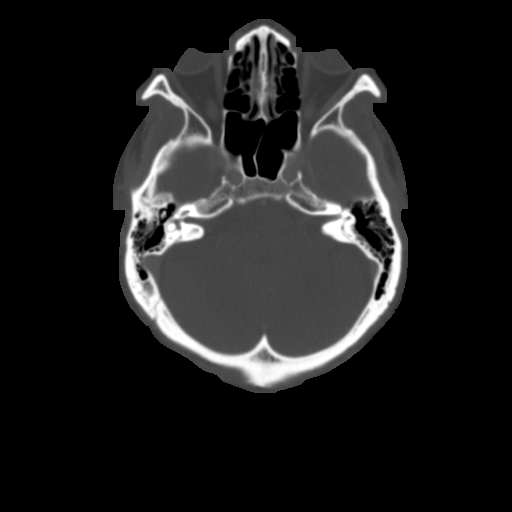

[sagital · sagittal · 0.51mm/px · 5 of 93 slices shown, 6 images]
[im 31/93  bone]
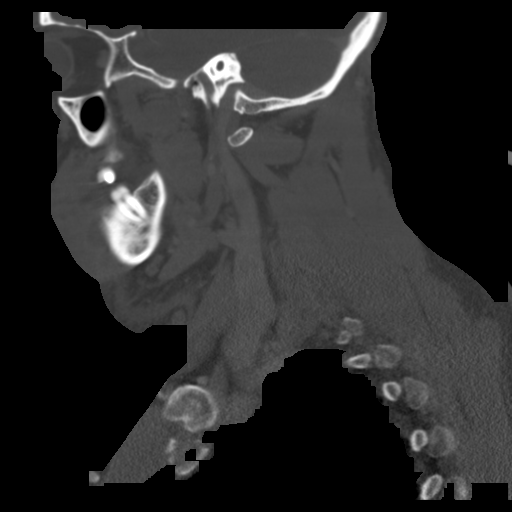
[im 39/93  bone]
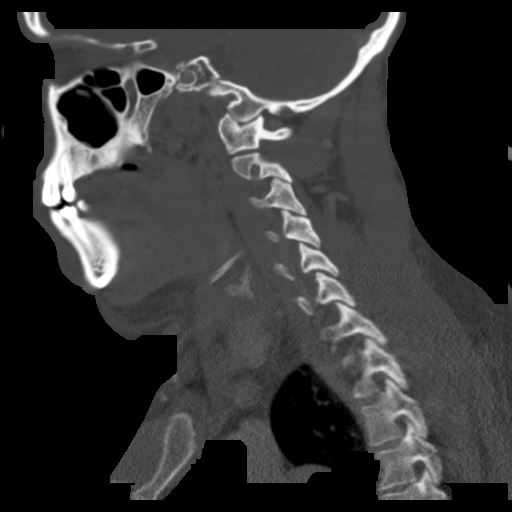
[im 47/93  soft-tissue]
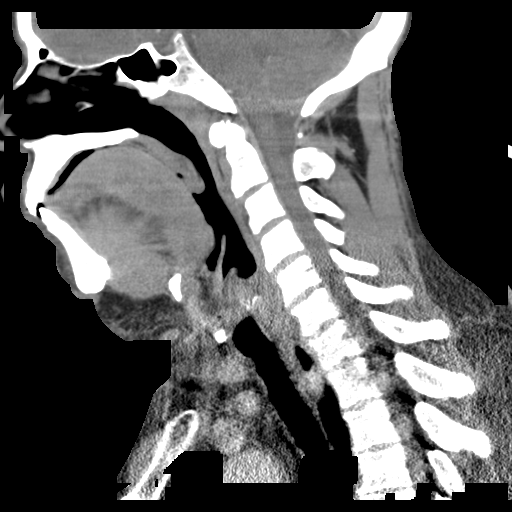
[im 47/93  bone]
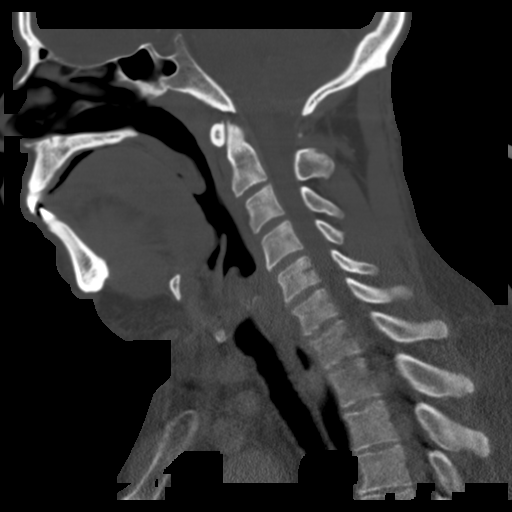
[im 54/93  bone]
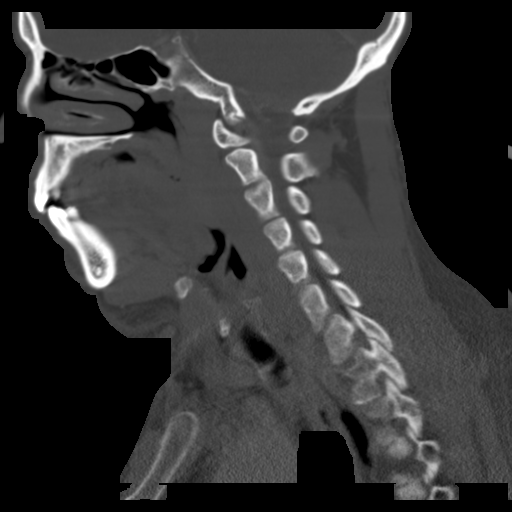
[im 62/93  bone]
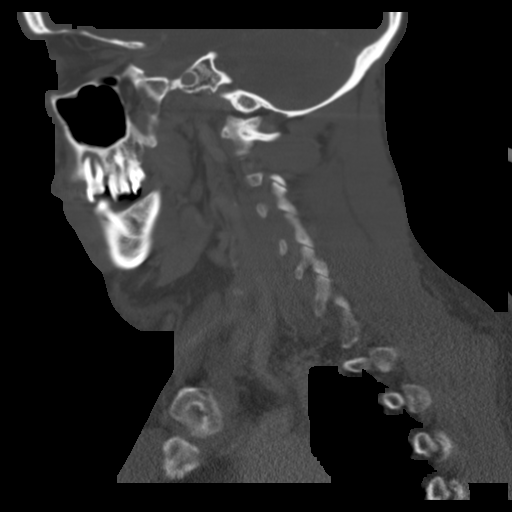

[coronal · coronal · 0.51mm/px · 3 of 128 slices shown]
[im 26/128  bone]
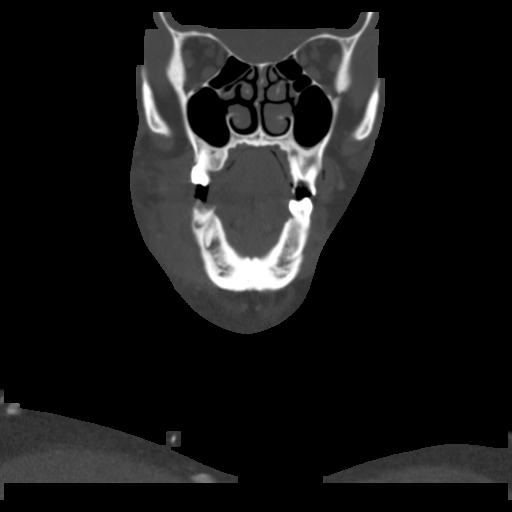
[im 51/128  bone]
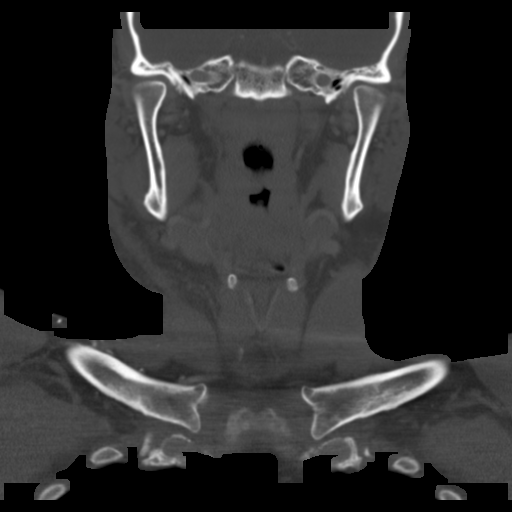
[im 77/128  bone]
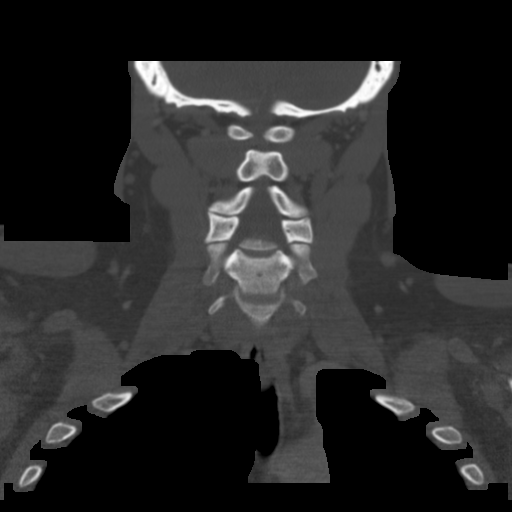

[16 of 33 positions shown; findings below may reference images not displayed]

FINDINGS: Lung apices are clear. No superior mediastinal pathology. Visualized
intracranial contents are unremarkable.

Both parotid glands are normal. Both submandibular glands are
normal. The thyroid gland is normal.

There is soft tissue swelling of the right side of the lower face
and the right side of the neck. There is nonspecific stranding in
the subcutaneous fat consistent with cellulitis. There is a
crescentic low density collection adjacent to the body of the
mandible on the right (outer side) measuring 23 x 8 mm consistent
with an adjacent abscess. There is low density adjacent to the roots
of teeth numbers 20 and 19 consistent with root abscesses. There is
erosion of the lateral margin of the mandible responsible for the
creation of the adjacent abscess. Elsewhere, multiple caries are
evident with evidence of periodontal disease. There are reactive
lymph nodes in the right side of the neck but no side operative
nodes. No evidence of deep space extension.
IMPRESSION: Extensive dental and periodontal disease. Root abcesses of the right
mandibular dentition, teeth numbers 19 and 20. Erosion of the
lateral margin of the mandible with an adjacent abscess measuring 23
x 8 mm. Nonspecific cellulitis of the right side of the face and
neck. No deep space extension.

## 2014-01-26 MED ORDER — IOHEXOL 300 MG/ML  SOLN
100.0000 mL | Freq: Once | INTRAMUSCULAR | Status: AC | PRN
  Administered 2014-01-26: 100 mL via INTRAVENOUS

## 2014-01-26 MED ORDER — IBUPROFEN 800 MG PO TABS
800.0000 mg | ORAL_TABLET | Freq: Three times a day (TID) | ORAL | Status: DC
  Administered 2014-01-26 – 2014-01-28 (×5): 800 mg via ORAL
  Filled 2014-01-26 (×9): qty 1

## 2014-01-26 MED ORDER — HEPARIN SODIUM (PORCINE) 5000 UNIT/ML IJ SOLN
5000.0000 [IU] | Freq: Three times a day (TID) | INTRAMUSCULAR | Status: DC
  Administered 2014-01-26 – 2014-01-30 (×12): 5000 [IU] via SUBCUTANEOUS
  Filled 2014-01-26 (×19): qty 1

## 2014-01-26 MED ORDER — IOHEXOL 350 MG/ML SOLN: 100.0000 mL | Freq: Once | INTRAVENOUS | Status: AC | PRN

## 2014-01-26 MED ORDER — ACETAMINOPHEN 325 MG PO TABS
650.0000 mg | ORAL_TABLET | Freq: Once | ORAL | Status: AC
  Administered 2014-01-26: 650 mg via ORAL
  Filled 2014-01-26: qty 2

## 2014-01-26 MED ORDER — ACETAMINOPHEN 650 MG RE SUPP: 650.0000 mg | Freq: Four times a day (QID) | RECTAL | Status: DC | PRN

## 2014-01-26 MED ORDER — CLINDAMYCIN PHOSPHATE 900 MG/50ML IV SOLN
900.0000 mg | Freq: Once | INTRAVENOUS | Status: AC
  Administered 2014-01-26: 900 mg via INTRAVENOUS
  Filled 2014-01-26: qty 50

## 2014-01-26 MED ORDER — ONDANSETRON HCL 4 MG PO TABS: 4.0000 mg | ORAL_TABLET | Freq: Four times a day (QID) | ORAL | Status: DC | PRN

## 2014-01-26 MED ORDER — OXYCODONE HCL 5 MG PO TABS
5.0000 mg | ORAL_TABLET | ORAL | Status: DC | PRN
  Administered 2014-01-27 – 2014-01-29 (×5): 5 mg via ORAL
  Filled 2014-01-26 (×5): qty 1

## 2014-01-26 MED ORDER — SODIUM CHLORIDE 0.9 % IV SOLN
Freq: Once | INTRAVENOUS | Status: AC
  Administered 2014-01-26: 14:00:00 via INTRAVENOUS

## 2014-01-26 MED ORDER — LORAZEPAM 2 MG/ML IJ SOLN
1.0000 mg | Freq: Once | INTRAMUSCULAR | Status: AC
  Administered 2014-01-26: 1 mg via INTRAVENOUS
  Filled 2014-01-26: qty 1

## 2014-01-26 MED ORDER — SODIUM CHLORIDE 0.9 % IV SOLN
3.0000 g | Freq: Three times a day (TID) | INTRAVENOUS | Status: DC
  Administered 2014-01-26 – 2014-01-30 (×12): 3 g via INTRAVENOUS
  Filled 2014-01-26 (×15): qty 3

## 2014-01-26 MED ORDER — MORPHINE SULFATE 2 MG/ML IJ SOLN
2.0000 mg | INTRAMUSCULAR | Status: DC | PRN
  Administered 2014-01-26 – 2014-01-27 (×3): 2 mg via INTRAVENOUS
  Filled 2014-01-26 (×4): qty 1

## 2014-01-26 MED ORDER — INFLUENZA VAC SPLIT QUAD 0.5 ML IM SUSP
0.5000 mL | INTRAMUSCULAR | Status: AC
  Administered 2014-01-27: 0.5 mL via INTRAMUSCULAR
  Filled 2014-01-26: qty 0.5

## 2014-01-26 MED ORDER — BENZOCAINE 10 % MT GEL
1.0000 "application " | Freq: Four times a day (QID) | OROMUCOSAL | Status: DC | PRN
  Filled 2014-01-26: qty 9.4

## 2014-01-26 MED ORDER — ACETAMINOPHEN 325 MG PO TABS
650.0000 mg | ORAL_TABLET | Freq: Four times a day (QID) | ORAL | Status: DC | PRN
  Administered 2014-01-27: 650 mg via ORAL
  Filled 2014-01-26: qty 2

## 2014-01-26 MED ORDER — ONDANSETRON HCL 4 MG/2ML IJ SOLN: 4.0000 mg | Freq: Four times a day (QID) | INTRAMUSCULAR | Status: DC | PRN

## 2014-01-26 NOTE — ED Notes (Signed)
Attempted to call report

## 2014-01-26 NOTE — ED Notes (Signed)
CT called to inquire about scan.  

## 2014-01-26 NOTE — Progress Notes (Signed)
Unit CM UR Completed by [facility] ED CM  W. Roger Fasnacht RN  

## 2014-01-26 NOTE — Progress Notes (Signed)
ANTIBIOTIC CONSULT NOTE - INITIAL  Pharmacy Consult for Unasyn Indication: dental/facial abscess  No Known Allergies  Patient Measurements: Height: 5\' 11"  (180.3 cm) Weight: 240 lb (108.863 kg) IBW/kg (Calculated) : 75.3  Vital Signs: Temp: 100.1 F (37.8 C) (03/18 1835) Temp src: Oral (03/18 1720) BP: 150/94 mmHg (03/18 1835) Pulse Rate: 100 (03/18 1835)  Labs:  Recent Labs  01/26/14 1343 01/26/14 1351  WBC 13.5*  --   HGB 15.8 16.7  PLT 165  --   CREATININE  --  1.30   Estimated Creatinine Clearance: 84.3 ml/min (by C-G formula based on Cr of 1.3). No results found for this basename: VANCOTROUGH, VANCOPEAK, VANCORANDOM, GENTTROUGH, GENTPEAK, GENTRANDOM, TOBRATROUGH, TOBRAPEAK, TOBRARND, AMIKACINPEAK, AMIKACINTROU, AMIKACIN,  in the last 72 hours   Microbiology: No results found for this or any previous visit (from the past 720 hour(s)).  Medical History: History reviewed. No pertinent past medical history.  Assessment: 53 yo M presented to ED 3/17 with facial/dental swelling and pain starting ~1 week prior, then discharged with prescription for dental wash and PO antibiotics.  He was unable to get RX filled and returns tonight febrile with Tmax in ER of 101.3.  CT has revealed abscess.  Patient has been ordered clindamycin, and pharmacy consulted to dose Unasyn.  Initial labs reveal WBC 13.5 and SCr 1.3 with estimated CrCl ~84.    Goal of Therapy:  Resolution of infection  Plan:  - start Unasyn IV 3gm q8h - monitor kidney function, WBC, temperature curve, any cultures, and clinical progression  Harrold DonathNathan E. Achilles Dunkope, PharmD Clinical Pharmacist - Resident Pager: [phone] Pharmacy: [phone] 01/26/2014 7:31 PM

## 2014-01-26 NOTE — ED Notes (Signed)
Laid patient back in chair per CT request.   Patient states that he should be able to do the CT now.    Patient having less trouble with laying back and swallowing.

## 2014-01-26 NOTE — ED Provider Notes (Signed)
  Physical Exam  BP 135/82  Pulse 93  Temp(Src) 101.3 F (38.5 C) (Oral)  Resp 22  Ht 5\' 11"  (1.803 m)  Wt 240 lb (108.863 kg)  BMI 33.49 kg/m2  SpO2 100%  Physical Exam  Consistent with previous.  ED Course  Procedures  MDM  Received from Elpidio AnisShari Upstill, PA-C. Awaiting CT results, will follow.  Right sided facial swelling, extending into neck. No difficulty breathing. VS stable. Febrile. Internal medicine Dr. Arthor CaptainElmahi will admit for IV antibiotics. First does of Clindamycin received here in ER.  Consulted Dr. Chales Salmonwsley, Oral surgery and he agrees to consult for admission.        Irish EldersKelly Abubakar Crispo, NP 01/26/14 1807

## 2014-01-26 NOTE — Progress Notes (Signed)
[facility] History and Physical  Jesse [name] WUJ:[id number] DOB: 08/11/1961 DOA: 01/26/2014  Referring physician: Irish EldersKelly Walker NP PCP: No PCP Per Patient   Chief Complaint: Facial swelling  HPI: Jesse PlaneSolomon D Shoultz is a 53 y.o. male with no significant past medical history came to the hospital because of facial swelling. Patient symptoms started about a week ago with right-sided facial swelling, associated with pain subjective fever. Patient came in to the emergency department 2 days ago discharged on antibiotics and dental wash. Came in back today febrile, with leukocytosis. Initial evaluation in the emergency department with CT scan showed facial swelling associated with a dental abscess.   Review of Systems:  Constitutional: complains fevers and sweats Eyes: negative for irritation, redness and visual disturbance Ears, nose, mouth, throat, and face: negative for earaches, epistaxis, nasal congestion and sore throat Respiratory: negative for cough, dyspnea on exertion, sputum and wheezing Cardiovascular: negative for chest pain, dyspnea, lower extremity edema, orthopnea, palpitations and syncope Gastrointestinal: negative for abdominal pain, constipation, diarrhea, melena, nausea and vomiting Genitourinary:negative for dysuria, frequency and hematuria Hematologic/lymphatic: negative for bleeding, easy bruising and lymphadenopathy Musculoskeletal:negative for arthralgias, muscle weakness and stiff joints Neurological: negative for coordination problems, gait problems, headaches and weakness Endocrine: negative for diabetic symptoms including polydipsia, polyuria and weight loss Allergic/Immunologic: negative for anaphylaxis, hay fever and urticaria  History reviewed. No pertinent past medical history. History reviewed. No pertinent past surgical history. Social History:   reports that he has never smoked. He has never used smokeless tobacco. He reports that he drinks about 1.2  ounces of alcohol per week. He reports that he does not use illicit drugs.  No Known Allergies  No family history on file.   Prior to Admission medications   Medication Sig Start Date End Date Taking? Authorizing Provider  benzocaine (ORAJEL) 10 % mucosal gel Use as directed 1 application in the mouth or throat as needed for mouth pain. 01/25/14   Jennifer L Piepenbrink, PA-C  ibuprofen (ADVIL,MOTRIN) 800 MG tablet Take 1 tablet (800 mg total) by mouth 3 (three) times daily. 01/25/14   Jennifer L Piepenbrink, PA-C  lidocaine (XYLOCAINE) 2 % solution Use as directed 15 mLs in the mouth or throat every 6 (six) hours as needed for mouth pain. 01/25/14   Jennifer L Piepenbrink, PA-C  penicillin v potassium (VEETID) 500 MG tablet Take 1 tablet (500 mg total) by mouth 4 (four) times daily. 01/25/14   Lise AuerJennifer L Piepenbrink, PA-C   Physical Exam: Filed Vitals:   01/26/14 1720  BP: 137/81  Pulse: 109  Temp: 100.8 F (38.2 C)  Resp: 18   Constitutional: He is oriented to person, place, and time. He appears well-developed and well-nourished. He is cooperative.  Head: Normocephalic and atraumatic. Right-sided facial swelling. Nose: Nose normal.  Mouth/Throat: Uvula is midline, oropharynx is clear and moist and mucous membranes are normal.  Eyes: Conjunctivae and EOM are normal. Pupils are equal, round, and reactive to light.  Neck: Trachea normal and normal range of motion. Neck supple.  Cardiovascular: Normal rate, regular rhythm, S1 normal, S2 normal, normal heart sounds and intact distal pulses.   Pulmonary/Chest: Effort normal and breath sounds normal.  Abdominal: Soft. Bowel sounds are normal. There is no hepatosplenomegaly. There is no tenderness.  Musculoskeletal: Normal range of motion.  Neurological: He is alert and oriented to person, place, and time. He has normal strength. No cranial nerve deficit or sensory deficit. He displays a negative Romberg sign.  Skin: Skin is warm,  dry and  intact.  Psychiatric: He has a normal mood and affect. His speech is normal and behavior is normal.   Labs on Admission:  Basic Metabolic Panel:  Recent Labs Lab 01/26/14 1351  NA 137  K 4.0  CL 98  GLUCOSE 241*  BUN 11  CREATININE 1.30   Liver Function Tests: No results found for this basename: AST, ALT, ALKPHOS, BILITOT, PROT, ALBUMIN,  in the last 168 hours No results found for this basename: LIPASE, AMYLASE,  in the last 168 hours No results found for this basename: AMMONIA,  in the last 168 hours CBC:  Recent Labs Lab 01/26/14 1343 01/26/14 1351  WBC 13.5*  --   NEUTROABS 11.4*  --   HGB 15.8 16.7  HCT 44.2 49.0  MCV 95.1  --   PLT 165  --    Cardiac Enzymes: No results found for this basename: CKTOTAL, CKMB, CKMBINDEX, TROPONINI,  in the last 168 hours  BNP (last 3 results) No results found for this basename: PROBNP,  in the last 8760 hours CBG: No results found for this basename: GLUCAP,  in the last 168 hours  Radiological Exams on Admission: Ct Soft Tissue Neck W Contrast  01/26/2014   CLINICAL DATA:  Dental pain of 1 month duration. Right-sided facial swelling. Fever.  EXAM: CT NECK WITH CONTRAST  TECHNIQUE: Multidetector CT imaging of the neck was performed using the standard protocol following the bolus administration of intravenous contrast.  CONTRAST:  OMNIPAQUE IOHEXOL 300 MG/ML  SOLN  COMPARISON:  None.  FINDINGS: Lung apices are clear. No superior mediastinal pathology. Visualized intracranial contents are unremarkable.  Both parotid glands are normal. Both submandibular glands are normal. The thyroid gland is normal.  There is soft tissue swelling of the right side of the lower face and the right side of the neck. There is nonspecific stranding in the subcutaneous fat consistent with cellulitis. There is a crescentic low density collection adjacent to the body of the mandible on the right (outer side) measuring 23 x 8 mm consistent with an adjacent  abscess. There is low density adjacent to the roots of teeth numbers 20 and 19 consistent with root abscesses. There is erosion of the lateral margin of the mandible responsible for the creation of the adjacent abscess. Elsewhere, multiple caries are evident with evidence of periodontal disease. There are reactive lymph nodes in the right side of the neck but no side operative nodes. No evidence of deep space extension.  IMPRESSION: Extensive dental and periodontal disease. Root abcesses of the right mandibular dentition, teeth numbers 19 and 20. Erosion of the lateral margin of the mandible with an adjacent abscess measuring 23 x 8 mm. Nonspecific cellulitis of the right side of the face and neck. No deep space extension.   Electronically Signed   By: Paulina Fusi M.D.   On: 01/26/2014 17:08    EKG: Independently reviewed.   Assessment/Plan Principal Problem:   Facial cellulitis Active Problems:   Dental abscess   Infected dental carries    Facial cellulitis -This associated with right-sided dental abscess, patient received dose of clindamycin in the emergency department. -Patient started on Unasyn. -Although there is swelling but there is no respiratory compromise patient was able to swallow without problems. -I will defer doing steroids for the swelling.  Dental abscess/infected dental caries -Discussed with the ED nurse practitioner, consult a dentist on call/maxillofacial surgery. -Patient will be on Unasyn.  Code Status: Full code Family Communication: Plan  discussed with patient Disposition Plan: Med surge  Time spent: 60 minutes  [facility] Pager [phone]

## 2014-01-26 NOTE — ED Provider Notes (Signed)
Medical screening examination/treatment/procedure(s) were performed by non-physician practitioner and as supervising physician I was immediately available for consultation/collaboration.   EKG Interpretation None       Aqil Goetting, MD 01/26/14 0219 

## 2014-01-26 NOTE — ED Notes (Signed)
Pyxis out of unasyn, pharmacy called to send.

## 2014-01-26 NOTE — ED Provider Notes (Signed)
CSN: [id number]     Arrival date & time 01/26/14  1133 History  This chart was scribed for non-physician practitioner Elpidio Anis PA-C working with Leonette Most B. Bernette Mayers, MD by Danella Maiers, ED Scribe. This patient was seen in room TR06C/TR06C and the patient's care was started at 1:05 PM.    Chief Complaint  Patient presents with  . Facial Swelling   The history is provided by the patient. No language interpreter was used.   HPI Comments: Jesse [name] is a 53 y.o. male who presents to the Emergency Department complaining of dental pain onset one month ago with right-sided facial swelling and abscess for the past week. He reports a little trouble swallowing but states he is able to eat and drink. He reports subjective fever at home. Pt was seen last night for the same. He states he was unable to get his prescription filled last night because the pharmacy was closed. He denies drainage into the mouth.    History reviewed. No pertinent past medical history. History reviewed. No pertinent past surgical history. No family history on file. History  Substance Use Topics  . Smoking status: Never Smoker   . Smokeless tobacco: Never Used  . Alcohol Use: 1.2 oz/week    2 Cans of beer per week     Comment: Occasional    Review of Systems  HENT: Positive for dental problem and facial swelling.   All other systems reviewed and are negative.      Allergies  Review of patient's allergies indicates no known allergies.  Home Medications   Current Outpatient Rx  Name  Route  Sig  Dispense  Refill  . benzocaine (ORAJEL) 10 % mucosal gel   Mouth/Throat   Use as directed 1 application in the mouth or throat as needed for mouth pain.   5.3 g   0   . ibuprofen (ADVIL,MOTRIN) 800 MG tablet   Oral   Take 1 tablet (800 mg total) by mouth 3 (three) times daily.   21 tablet   0   . lidocaine (XYLOCAINE) 2 % solution   Mouth/Throat   Use as directed 15 mLs in the mouth or throat every 6  (six) hours as needed for mouth pain.   100 mL   0   . penicillin v potassium (VEETID) 500 MG tablet   Oral   Take 1 tablet (500 mg total) by mouth 4 (four) times daily.   40 tablet   0    BP 186/90  Pulse 94  Temp(Src) 99.1 F (37.3 C) (Oral)  Resp 22  Ht 5\' 11"  (1.803 m)  Wt 240 lb (108.863 kg)  BMI 33.49 kg/m2  SpO2 96% Physical Exam  Nursing note and vitals reviewed. Constitutional: He is oriented to person, place, and time. He appears well-developed and well-nourished. No distress.  HENT:  Head: Normocephalic and atraumatic.  Number 30 marked decay deep pocket no active drainage. Marked right facial swelling extending into the submental and anterior neck areas.   Eyes: EOM are normal.  Neck: Neck supple. No tracheal deviation present.  Cardiovascular: Normal rate.   Pulmonary/Chest: Effort normal. No respiratory distress.  Musculoskeletal: Normal range of motion.  Neurological: He is alert and oriented to person, place, and time.  Skin: Skin is warm and dry.  Psychiatric: He has a normal mood and affect. His behavior is normal.    ED Course  Procedures (including critical care time) Medications  LORazepam (ATIVAN) injection 1 mg (not administered)  0.9 %  sodium chloride infusion ( Intravenous New Bag/Given 01/26/14 1350)  clindamycin (CLEOCIN) IVPB 900 mg (900 mg Intravenous New Bag/Given 01/26/14 1350)    DIAGNOSTIC STUDIES: Oxygen Saturation is 96% on RA, normal by my interpretation.    COORDINATION OF CARE: 1:26 PM- Discussed treatment plan with pt. Pt agrees to plan.    Labs Review Labs Reviewed  CBC WITH DIFFERENTIAL - Abnormal; Notable for the following:    WBC 13.5 (*)    Neutrophils Relative % 85 (*)    Neutro Abs 11.4 (*)    Lymphocytes Relative 5 (*)    Monocytes Absolute 1.3 (*)    All other components within normal limits  I-STAT CHEM 8, ED - Abnormal; Notable for the following:    Glucose, Bld 241 (*)    All other components within  normal limits   Imaging Review No results found.   EKG Interpretation None      MDM   Final diagnoses:  None    1. Dental abscess  Patient care transferred to Irish EldersKelly Walker, PA-C with CT pending to evaluate for abscess to neck and/or oropharyngeal area.   I personally performed the services described in this documentation, which was scribed in my presence. The recorded information has been reviewed and is accurate.     Arnoldo HookerShari A Marckus Hanover, PA-C 02/13/14 [phone]

## 2014-01-26 NOTE — ED Notes (Signed)
Flow called to inquire about pt's bed request.  

## 2014-01-26 NOTE — ED Notes (Signed)
Pt seen last night for same c/o R facial swelling tooth pain.  Pt states he was unable to get Rx filled last night because pharmacy closed.  Pt has not gotten Rx filled today.

## 2014-01-26 NOTE — ED Notes (Signed)
Admitting MD at bedside.

## 2014-01-26 NOTE — ED Notes (Signed)
Attempted to give report 

## 2014-01-27 DIAGNOSIS — E119 Type 2 diabetes mellitus without complications: Secondary | ICD-10-CM | POA: Diagnosis present

## 2014-01-27 DIAGNOSIS — IMO0002 Reserved for concepts with insufficient information to code with codable children: Secondary | ICD-10-CM | POA: Diagnosis present

## 2014-01-27 DIAGNOSIS — N179 Acute kidney failure, unspecified: Secondary | ICD-10-CM

## 2014-01-27 DIAGNOSIS — D72829 Elevated white blood cell count, unspecified: Secondary | ICD-10-CM | POA: Insufficient documentation

## 2014-01-27 DIAGNOSIS — E1165 Type 2 diabetes mellitus with hyperglycemia: Secondary | ICD-10-CM | POA: Diagnosis present

## 2014-01-27 DIAGNOSIS — R7309 Other abnormal glucose: Secondary | ICD-10-CM

## 2014-01-27 HISTORY — DX: Acute kidney failure, unspecified: N17.9

## 2014-01-27 HISTORY — DX: Elevated white blood cell count, unspecified: D72.829

## 2014-01-27 LAB — CBC
HCT: 40 % (ref 39.0–52.0)
Hemoglobin: 14.4 g/dL (ref 13.0–17.0)
MCH: 34.2 pg — ABNORMAL HIGH (ref 26.0–34.0)
MCHC: 36 g/dL (ref 30.0–36.0)
MCV: 95 fL (ref 78.0–100.0)
PLATELETS: 145 10*3/uL — AB (ref 150–400)
RBC: 4.21 MIL/uL — ABNORMAL LOW (ref 4.22–5.81)
RDW: 12.7 % (ref 11.5–15.5)
WBC: 9 10*3/uL (ref 4.0–10.5)

## 2014-01-27 LAB — HEMOGLOBIN A1C
Hgb A1c MFr Bld: 10.4 % — ABNORMAL HIGH (ref <5.7)
Mean Plasma Glucose: 252 mg/dL — ABNORMAL HIGH (ref <117)

## 2014-01-27 LAB — BASIC METABOLIC PANEL
BUN: 13 mg/dL (ref 6–23)
CALCIUM: 8.9 mg/dL (ref 8.4–10.5)
CHLORIDE: 98 meq/L (ref 96–112)
CO2: 26 mEq/L (ref 19–32)
CREATININE: 1.45 mg/dL — AB (ref 0.50–1.35)
GFR calc Af Amer: 63 mL/min — ABNORMAL LOW (ref >90)
GFR calc non Af Amer: 54 mL/min — ABNORMAL LOW (ref >90)
GLUCOSE: 206 mg/dL — AB (ref 70–99)
Potassium: 3.5 mEq/L — ABNORMAL LOW (ref 3.7–5.3)
Sodium: 137 mEq/L (ref 137–147)

## 2014-01-27 LAB — GLUCOSE, CAPILLARY
GLUCOSE-CAPILLARY: 218 mg/dL — AB (ref 70–99)
GLUCOSE-CAPILLARY: 157 mg/dL — AB (ref 70–99)
GLUCOSE-CAPILLARY: 164 mg/dL — AB (ref 70–99)
Glucose-Capillary: 208 mg/dL — ABNORMAL HIGH (ref 70–99)
Glucose-Capillary: 128 mg/dL — ABNORMAL HIGH (ref 70–99)

## 2014-01-27 MED ORDER — SODIUM CHLORIDE 0.9 % IV SOLN
INTRAVENOUS | Status: DC
  Administered 2014-01-27 – 2014-01-28 (×2): via INTRAVENOUS
  Administered 2014-01-29: 20 mL/h via INTRAVENOUS

## 2014-01-27 MED ORDER — POTASSIUM CHLORIDE 20 MEQ/15ML (10%) PO LIQD
40.0000 meq | Freq: Once | ORAL | Status: AC
  Administered 2014-01-27: 40 meq via ORAL
  Filled 2014-01-27: qty 30

## 2014-01-27 MED ORDER — INSULIN ASPART 100 UNIT/ML ~~LOC~~ SOLN: 0.0000 [IU] | Freq: Every day | SUBCUTANEOUS | Status: DC

## 2014-01-27 MED ORDER — INSULIN ASPART 100 UNIT/ML ~~LOC~~ SOLN
0.0000 [IU] | Freq: Three times a day (TID) | SUBCUTANEOUS | Status: DC
Start: 2014-01-27 — End: 2014-01-29
  Administered 2014-01-27: 5 [IU] via SUBCUTANEOUS
  Administered 2014-01-27 – 2014-01-29 (×5): 3 [IU] via SUBCUTANEOUS

## 2014-01-27 NOTE — ED Provider Notes (Signed)
Medical screening examination/treatment/procedure(s) were conducted as a shared visit with non-physician practitioner(s) or resident  and myself.  I personally evaluated the patient during the encounter and agree with the findings and plan unless otherwise indicated.    I have personally reviewed any xrays and/ or EKG's with the provider and I agree with interpretation.   Worsening dental abscess and facial swelling. Right lower facial swelling, poor dentition, fluctuance right lower lateral gingiva, mild trismus.  Neck supple.  No significant submandibular swelling, mmm.  No distress.  Pt admitted with oral surgery consults, IV abx.  Dental abscess, Facial swelling  Enid SkeensJoshua M Rahil Passey, MD 01/27/14 [phone]

## 2014-01-27 NOTE — Progress Notes (Signed)
PROGRESS NOTE  SALMAAN PATCHIN [name]:[id number] DOB: 21-Aug-1961 DOA: 01/26/2014 PCP: No PCP Per Patient  Assessment/Plan: Facial cellulitis  -This associated with right-sided dental abscess, patient received dose of clindamycin in the emergency department.  - Unasyn.   Dental abscess/infected dental caries  -consult a dentist on call/maxillofacial surgery.  -Unasyn.  Hypokalemia -replete  AKI -?baseline -IVF-recheck in AM  Hyperglycemia -check HgbA1c -SSI   Code Status: full Family Communication: patient Disposition Plan: home 2-3 days   Consultants:  Dental (Dr. Chales Salmon)  Procedures:     HPI/Subjective: Patient says he is about the same No CP, no SOB Appetite good   Objective: Filed Vitals:   01/27/14 0427  BP: 116/77  Pulse: 89  Temp: 98.5 F (36.9 C)  Resp: 17    Intake/Output Summary (Last 24 hours) at 01/27/14 0829 Last data filed at 01/27/14 0425  Gross per 24 hour  Intake   1460 ml  Output      2 ml  Net   1458 ml   Filed Weights   01/26/14 1143  Weight: 108.863 kg (240 lb)    Exam:  Constitutional: He is oriented to person, place, and time. He appears well-developed and well-nourished. He is cooperative.  Cardiovascular: Normal rate, regular rhythm, S1 normal, S2 normal, normal heart sounds and intact distal pulses.  Pulmonary/Chest: Effort normal and breath sounds normal.  Abdominal: Soft. Bowel sounds are normal. There is no hepatosplenomegaly. There is no tenderness.  Face: right facial swelling, no airway involvement   Data Reviewed: Basic Metabolic Panel:  Recent Labs Lab 01/26/14 1351 01/27/14 0530  NA 137 137  K 4.0 3.5*  CL 98 98  CO2  --  26  GLUCOSE 241* 206*  BUN 11 13  CREATININE 1.30 1.45*  CALCIUM  --  8.9   Liver Function Tests: No results found for this basename: AST, ALT, ALKPHOS, BILITOT, PROT, ALBUMIN,  in the last 168 hours No results found for this basename: LIPASE, AMYLASE,  in the last 168  hours No results found for this basename: AMMONIA,  in the last 168 hours CBC:  Recent Labs Lab 01/26/14 1343 01/26/14 1351 01/27/14 0530  WBC 13.5*  --  9.0  NEUTROABS 11.4*  --   --   HGB 15.8 16.7 14.4  HCT 44.2 49.0 40.0  MCV 95.1  --  95.0  PLT 165  --  145*   Cardiac Enzymes: No results found for this basename: CKTOTAL, CKMB, CKMBINDEX, TROPONINI,  in the last 168 hours BNP (last 3 results) No results found for this basename: PROBNP,  in the last 8760 hours CBG: No results found for this basename: GLUCAP,  in the last 168 hours  No results found for this or any previous visit (from the past 240 hour(s)).   Studies: Ct Soft Tissue Neck W Contrast  01/26/2014   CLINICAL DATA:  Dental pain of 1 month duration. Right-sided facial swelling. Fever.  EXAM: CT NECK WITH CONTRAST  TECHNIQUE: Multidetector CT imaging of the neck was performed using the standard protocol following the bolus administration of intravenous contrast.  CONTRAST:  OMNIPAQUE IOHEXOL 300 MG/ML  SOLN  COMPARISON:  None.  FINDINGS: Lung apices are clear. No superior mediastinal pathology. Visualized intracranial contents are unremarkable.  Both parotid glands are normal. Both submandibular glands are normal. The thyroid gland is normal.  There is soft tissue swelling of the right side of the lower face and the right side of the neck. There is nonspecific  stranding in the subcutaneous fat consistent with cellulitis. There is a crescentic low density collection adjacent to the body of the mandible on the right (outer side) measuring 23 x 8 mm consistent with an adjacent abscess. There is low density adjacent to the roots of teeth numbers 20 and 19 consistent with root abscesses. There is erosion of the lateral margin of the mandible responsible for the creation of the adjacent abscess. Elsewhere, multiple caries are evident with evidence of periodontal disease. There are reactive lymph nodes in the right side of the  neck but no side operative nodes. No evidence of deep space extension.  IMPRESSION: Extensive dental and periodontal disease. Root abcesses of the right mandibular dentition, teeth numbers 19 and 20. Erosion of the lateral margin of the mandible with an adjacent abscess measuring 23 x 8 mm. Nonspecific cellulitis of the right side of the face and neck. No deep space extension.   Electronically Signed   By: Paulina FusiMark  Shogry M.D.   On: 01/26/2014 17:08    Scheduled Meds: . ampicillin-sulbactam (UNASYN) IV  3 g Intravenous Q8H  . heparin  5,000 Units Subcutaneous 3 times per day  . ibuprofen  800 mg Oral TID  . influenza vac split quadrivalent PF  0.5 mL Intramuscular Tomorrow-1000  . potassium chloride  40 mEq Oral Once   Continuous Infusions: . sodium chloride     Antibiotics Given (last 72 hours)   Date/Time Action Medication Dose Rate   01/26/14 2036 Given   Ampicillin-Sulbactam (UNASYN) 3 g in sodium chloride 0.9 % 100 mL IVPB 3 g 100 mL/hr   01/27/14 0425 Given   Ampicillin-Sulbactam (UNASYN) 3 g in sodium chloride 0.9 % 100 mL IVPB 3 g 100 mL/hr      Principal Problem:   Facial cellulitis Active Problems:   Dental abscess   Infected dental carries   Cellulitis and abscess    Time spent: 35 min    Ranon Coven  [facility] Pager [phone]. If 7PM-7AM, please contact night-coverage at [email or web address], password [email or web address] 01/27/2014, 8:29 AM  LOS: 1 day

## 2014-01-28 DIAGNOSIS — E876 Hypokalemia: Secondary | ICD-10-CM | POA: Diagnosis present

## 2014-01-28 DIAGNOSIS — E1165 Type 2 diabetes mellitus with hyperglycemia: Secondary | ICD-10-CM

## 2014-01-28 DIAGNOSIS — IMO0001 Reserved for inherently not codable concepts without codable children: Secondary | ICD-10-CM

## 2014-01-28 HISTORY — DX: Hypokalemia: E87.6

## 2014-01-28 LAB — GLUCOSE, CAPILLARY
GLUCOSE-CAPILLARY: 130 mg/dL — AB (ref 70–99)
Glucose-Capillary: 163 mg/dL — ABNORMAL HIGH (ref 70–99)
Glucose-Capillary: 187 mg/dL — ABNORMAL HIGH (ref 70–99)
Glucose-Capillary: 94 mg/dL (ref 70–99)

## 2014-01-28 LAB — BASIC METABOLIC PANEL
BUN: 10 mg/dL (ref 6–23)
CHLORIDE: 101 meq/L (ref 96–112)
CO2: 23 mEq/L (ref 19–32)
Calcium: 9 mg/dL (ref 8.4–10.5)
Creatinine, Ser: 1.15 mg/dL (ref 0.50–1.35)
GFR calc Af Amer: 83 mL/min — ABNORMAL LOW (ref >90)
GFR, EST NON AFRICAN AMERICAN: 72 mL/min — AB (ref >90)
GLUCOSE: 165 mg/dL — AB (ref 70–99)
Potassium: 3.5 mEq/L — ABNORMAL LOW (ref 3.7–5.3)
SODIUM: 139 meq/L (ref 137–147)

## 2014-01-28 LAB — CBC
HCT: 39.8 % (ref 39.0–52.0)
HEMOGLOBIN: 13.9 g/dL (ref 13.0–17.0)
MCH: 33.2 pg (ref 26.0–34.0)
MCHC: 34.9 g/dL (ref 30.0–36.0)
MCV: 95 fL (ref 78.0–100.0)
Platelets: 148 10*3/uL — ABNORMAL LOW (ref 150–400)
RBC: 4.19 MIL/uL — AB (ref 4.22–5.81)
RDW: 12.7 % (ref 11.5–15.5)
WBC: 8.3 10*3/uL (ref 4.0–10.5)

## 2014-01-28 MED ORDER — GLIPIZIDE 5 MG PO TABS
5.0000 mg | ORAL_TABLET | Freq: Every day | ORAL | Status: DC
  Administered 2014-01-28 – 2014-01-31 (×4): 5 mg via ORAL
  Filled 2014-01-28 (×6): qty 1

## 2014-01-28 MED ORDER — POTASSIUM CHLORIDE 10 MEQ/100ML IV SOLN
10.0000 meq | Freq: Once | INTRAVENOUS | Status: AC
  Administered 2014-01-28: 10 meq via INTRAVENOUS
  Filled 2014-01-28: qty 100

## 2014-01-28 MED ORDER — IBUPROFEN 400 MG PO TABS
400.0000 mg | ORAL_TABLET | Freq: Four times a day (QID) | ORAL | Status: DC | PRN
  Administered 2014-01-28: 400 mg via ORAL
  Filled 2014-01-28: qty 1

## 2014-01-28 NOTE — H&P (Signed)
[facility] History and Physical  Jesse [name] ZOX:[id number] DOB: 17-Nov-1960 DOA: 01/26/2014  Referring physician: Irish Elders NP PCP: No PCP Per Patient   Chief Complaint: Facial swelling  HPI: Jesse [name] is a 53 y.o. male with no significant past medical history came to the hospital because of facial swelling. Patient symptoms started about a week ago with right-sided facial swelling, associated with pain subjective fever. Patient came in to the emergency department 2 days ago discharged on antibiotics and dental wash. Came in back today febrile, with leukocytosis. Initial evaluation in the emergency department with CT scan showed facial swelling associated with a dental abscess.   Review of Systems:  Constitutional: complains fevers and sweats Eyes: negative for irritation, redness and visual disturbance Ears, nose, mouth, throat, and face: negative for earaches, epistaxis, nasal congestion and sore throat Respiratory: negative for cough, dyspnea on exertion, sputum and wheezing Cardiovascular: negative for chest pain, dyspnea, lower extremity edema, orthopnea, palpitations and syncope Gastrointestinal: negative for abdominal pain, constipation, diarrhea, melena, nausea and vomiting Genitourinary:negative for dysuria, frequency and hematuria Hematologic/lymphatic: negative for bleeding, easy bruising and lymphadenopathy Musculoskeletal:negative for arthralgias, muscle weakness and stiff joints Neurological: negative for coordination problems, gait problems, headaches and weakness Endocrine: negative for diabetic symptoms including polydipsia, polyuria and weight loss Allergic/Immunologic: negative for anaphylaxis, hay fever and urticaria  History reviewed. No pertinent past medical history. History reviewed. No pertinent past surgical history. Social History:   reports that he has never smoked. He has never used smokeless tobacco. He reports that he drinks about 1.2  ounces of alcohol per week. He reports that he does not use illicit drugs.  No Known Allergies  No family history on file.   Prior to Admission medications   Medication Sig Start Date End Date Taking? Authorizing Provider  benzocaine (ORAJEL) 10 % mucosal gel Use as directed 1 application in the mouth or throat as needed for mouth pain. 01/25/14   Jesse L Piepenbrink, PA-C  ibuprofen (ADVIL,MOTRIN) 800 MG tablet Take 1 tablet (800 mg total) by mouth 3 (three) times daily. 01/25/14   Jesse L Piepenbrink, PA-C  lidocaine (XYLOCAINE) 2 % solution Use as directed 15 mLs in the mouth or throat every 6 (six) hours as needed for mouth pain. 01/25/14   Jesse L Piepenbrink, PA-C  penicillin v potassium (VEETID) 500 MG tablet Take 1 tablet (500 mg total) by mouth 4 (four) times daily. 01/25/14   Jesse Auer Piepenbrink, PA-C   Physical Exam: Filed Vitals:   01/28/14 1409  BP: 122/80  Pulse: 82  Temp: 98 F (36.7 C)  Resp: 18   Constitutional: He is oriented to person, place, and time. He appears well-developed and well-nourished. He is cooperative.  Head: Normocephalic and atraumatic. Right-sided facial swelling. Nose: Nose normal.  Mouth/Throat: Uvula is midline, oropharynx is clear and moist and mucous membranes are normal.  Eyes: Conjunctivae and EOM are normal. Pupils are equal, round, and reactive to light.  Neck: Trachea normal and normal range of motion. Neck supple.  Cardiovascular: Normal rate, regular rhythm, S1 normal, S2 normal, normal heart sounds and intact distal pulses.   Pulmonary/Chest: Effort normal and breath sounds normal.  Abdominal: Soft. Bowel sounds are normal. There is no hepatosplenomegaly. There is no tenderness.  Musculoskeletal: Normal range of motion.  Neurological: He is alert and oriented to person, place, and time. He has normal strength. No cranial nerve deficit or sensory deficit. He displays a negative Romberg sign.  Skin: Skin is warm,  dry and intact.    Psychiatric: He has a normal mood and affect. His speech is normal and behavior is normal.   Labs on Admission:  Basic Metabolic Panel:  Recent Labs Lab 01/26/14 1351 01/27/14 0530 01/28/14 0325  NA 137 137 139  K 4.0 3.5* 3.5*  CL 98 98 101  CO2  --  26 23  GLUCOSE 241* 206* 165*  BUN 11 13 10   CREATININE 1.30 1.45* 1.15  CALCIUM  --  8.9 9.0   Liver Function Tests: No results found for this basename: AST, ALT, ALKPHOS, BILITOT, PROT, ALBUMIN,  in the last 168 hours No results found for this basename: LIPASE, AMYLASE,  in the last 168 hours No results found for this basename: AMMONIA,  in the last 168 hours CBC:  Recent Labs Lab 01/26/14 1343 01/26/14 1351 01/27/14 0530 01/28/14 0325  WBC 13.5*  --  9.0 8.3  NEUTROABS 11.4*  --   --   --   HGB 15.8 16.7 14.4 13.9  HCT 44.2 49.0 40.0 39.8  MCV 95.1  --  95.0 95.0  PLT 165  --  145* 148*   Cardiac Enzymes: No results found for this basename: CKTOTAL, CKMB, CKMBINDEX, TROPONINI,  in the last 168 hours  BNP (last 3 results) No results found for this basename: PROBNP,  in the last 8760 hours CBG:  Recent Labs Lab 01/27/14 1656 01/27/14 2056 01/27/14 2142 01/28/14 0759 01/28/14 1134  GLUCAP 157* 164* 128* 163* 130*    Radiological Exams on Admission: Ct Soft Tissue Neck W Contrast  01/26/2014   CLINICAL DATA:  Dental pain of 1 month duration. Right-sided facial swelling. Fever.  EXAM: CT NECK WITH CONTRAST  TECHNIQUE: Multidetector CT imaging of the neck was performed using the standard protocol following the bolus administration of intravenous contrast.  CONTRAST:  100mL OMNIPAQUE IOHEXOL 300 MG/ML  SOLN  COMPARISON:  None.  FINDINGS: Lung apices are clear. No superior mediastinal pathology. Visualized intracranial contents are unremarkable.  Both parotid glands are normal. Both submandibular glands are normal. The thyroid gland is normal.  There is soft tissue swelling of the right side of the lower face and  the right side of the neck. There is nonspecific stranding in the subcutaneous fat consistent with cellulitis. There is a crescentic low density collection adjacent to the body of the mandible on the right (outer side) measuring 23 x 8 mm consistent with an adjacent abscess. There is low density adjacent to the roots of teeth numbers 20 and 19 consistent with root abscesses. There is erosion of the lateral margin of the mandible responsible for the creation of the adjacent abscess. Elsewhere, multiple caries are evident with evidence of periodontal disease. There are reactive lymph nodes in the right side of the neck but no side operative nodes. No evidence of deep space extension.  IMPRESSION: Extensive dental and periodontal disease. Root abcesses of the right mandibular dentition, teeth numbers 19 and 20. Erosion of the lateral margin of the mandible with an adjacent abscess measuring 23 x 8 mm. Nonspecific cellulitis of the right side of the face and neck. No deep space extension.   Electronically Signed   By: Paulina FusiMark  Shogry M.D.   On: 01/26/2014 17:08    EKG: Independently reviewed.   Assessment/Plan Principal Problem:   Facial cellulitis Active Problems:   Dental abscess   Cellulitis and abscess   DM (diabetes mellitus), type 2, uncontrolled   AKI (acute kidney injury)   Hypokalemia  Facial cellulitis -This associated with right-sided dental abscess, patient received dose of clindamycin in the emergency department. -Patient started on Unasyn. -Although there is swelling but there is no respiratory compromise patient was able to swallow without problems. -I will defer doing steroids for the swelling.  Dental abscess/infected dental caries -Discussed with the ED nurse practitioner, consult a dentist on call/maxillofacial surgery. -Patient will be on Unasyn.  Code Status: Full code Family Communication: Plan discussed with patient Disposition Plan: Med surge  Time spent: 60  minutes  [facility] A [facility] Pager [phone]

## 2014-01-28 NOTE — Progress Notes (Signed)
chart reviewed. PROGRESS NOTE  Jesse [name] ZOX:[id number] DOB: [birth year] DOA: 01/26/2014 PCP: No PCP Per Patient  Assessment/Plan: Facial cellulitis  Per patient, improving on unasyn. Still with extensive left jaw edema   Dental abscess/infected dental caries  D/w Dr. Arthor Captain. EDP reportedly called Dr. Chales Salmon, and said he would consult. After 3 days, no dental or OMF consult. Spoke with [place] who said he told EDP to call Niles, DDS. I have consulted, but may not see until Monday.    DM: new dx. Start glipizide. Diabetic teaching. Needs PCP  Hypokalemia -replete  AKI resolved   Code Status: full Family Communication: patient Disposition Plan:    Consultants:  Kulinski  Procedures:     HPI/Subjective: Feels much improved. Able to tolerate clears more easily   Objective: Filed Vitals:   01/28/14 1409  BP: 122/80  Pulse: 82  Temp: 98 F (36.7 C)  Resp: 18    Intake/Output Summary (Last 24 hours) at 01/28/14 1424 Last data filed at 01/28/14 0407  Gross per 24 hour  Intake 1656.25 ml  Output      3 ml  Net 1653.25 ml   Filed Weights   01/26/14 1143  Weight: 108.863 kg (240 lb)    Exam:  HEENT: left face, jaw, and neck edema. Dental carries and fractured teeth Cardiovascular: Normal rate, regular rhythm, S1 normal, S2 normal, normal heart sounds and intact distal pulses.  Pulmonary/Chest: Effort normal and breath sounds normal.  Abdominal: Soft. Bowel sounds are normal. There is no hepatosplenomegaly. There is no tenderness.  Ext no CCE   Data Reviewed: Basic Metabolic Panel:  Recent Labs Lab 01/26/14 1351 01/27/14 0530 01/28/14 0325  NA 137 137 139  K 4.0 3.5* 3.5*  CL 98 98 101  CO2  --  26 23  GLUCOSE 241* 206* 165*  BUN 11 13 10   CREATININE 1.30 1.45* 1.15  CALCIUM  --  8.9 9.0   Liver Function Tests: No results found for this basename: AST, ALT, ALKPHOS, BILITOT, PROT, ALBUMIN,  in the last 168 hours No results found for  this basename: LIPASE, AMYLASE,  in the last 168 hours No results found for this basename: AMMONIA,  in the last 168 hours CBC:  Recent Labs Lab 01/26/14 1343 01/26/14 1351 01/27/14 0530 01/28/14 0325  WBC 13.5*  --  9.0 8.3  NEUTROABS 11.4*  --   --   --   HGB 15.8 16.7 14.4 13.9  HCT 44.2 49.0 40.0 39.8  MCV 95.1  --  95.0 95.0  PLT 165  --  145* 148*   Cardiac Enzymes: No results found for this basename: CKTOTAL, CKMB, CKMBINDEX, TROPONINI,  in the last 168 hours BNP (last 3 results) No results found for this basename: PROBNP,  in the last 8760 hours CBG:  Recent Labs Lab 01/27/14 1656 01/27/14 2056 01/27/14 2142 01/28/14 0759 01/28/14 1134  GLUCAP 157* 164* 128* 163* 130*    No results found for this or any previous visit (from the past 240 hour(s)).   Studies: Ct Soft Tissue Neck W Contrast  01/26/2014   CLINICAL DATA:  Dental pain of 1 month duration. Right-sided facial swelling. Fever.  EXAM: CT NECK WITH CONTRAST  TECHNIQUE: Multidetector CT imaging of the neck was performed using the standard protocol following the bolus administration of intravenous contrast.  CONTRAST:  OMNIPAQUE IOHEXOL 300 MG/ML  SOLN  COMPARISON:  None.  FINDINGS: Lung apices are clear. No superior mediastinal pathology. Visualized intracranial contents are  unremarkable.  Both parotid glands are normal. Both submandibular glands are normal. The thyroid gland is normal.  There is soft tissue swelling of the right side of the lower face and the right side of the neck. There is nonspecific stranding in the subcutaneous fat consistent with cellulitis. There is a crescentic low density collection adjacent to the body of the mandible on the right (outer side) measuring 23 x 8 mm consistent with an adjacent abscess. There is low density adjacent to the roots of teeth numbers 20 and 19 consistent with root abscesses. There is erosion of the lateral margin of the mandible responsible for the creation  of the adjacent abscess. Elsewhere, multiple caries are evident with evidence of periodontal disease. There are reactive lymph nodes in the right side of the neck but no side operative nodes. No evidence of deep space extension.  IMPRESSION: Extensive dental and periodontal disease. Root abcesses of the right mandibular dentition, teeth numbers 19 and 20. Erosion of the lateral margin of the mandible with an adjacent abscess measuring 23 x 8 mm. Nonspecific cellulitis of the right side of the face and neck. No deep space extension.   Electronically Signed   By: Paulina FusiMark  Shogry M.D.   On: 01/26/2014 17:08    Scheduled Meds: . ampicillin-sulbactam (UNASYN) IV  3 g Intravenous Q8H  . glipiZIDE  5 mg Oral QAC breakfast  . heparin  5,000 Units Subcutaneous 3 times per day  . insulin aspart  0-15 Units Subcutaneous TID WC   Continuous Infusions: . sodium chloride 75 mL/hr at 01/28/14 0204   Antibiotics Given (last 72 hours)   Date/Time Action Medication Dose Rate   01/26/14 2036 Given   Ampicillin-Sulbactam (UNASYN) 3 g in sodium chloride 0.9 % 100 mL IVPB 3 g 100 mL/hr   01/27/14 0425 Given   Ampicillin-Sulbactam (UNASYN) 3 g in sodium chloride 0.9 % 100 mL IVPB 3 g 100 mL/hr   01/27/14 1227 Given   Ampicillin-Sulbactam (UNASYN) 3 g in sodium chloride 0.9 % 100 mL IVPB 3 g 100 mL/hr   01/27/14 2041 Given   Ampicillin-Sulbactam (UNASYN) 3 g in sodium chloride 0.9 % 100 mL IVPB 3 g 100 mL/hr   01/28/14 0407 Given   Ampicillin-Sulbactam (UNASYN) 3 g in sodium chloride 0.9 % 100 mL IVPB 3 g 100 mL/hr   01/28/14 1155 Given   Ampicillin-Sulbactam (UNASYN) 3 g in sodium chloride 0.9 % 100 mL IVPB 3 g 100 mL/hr      Principal Problem:   Facial cellulitis Active Problems:   Dental abscess   Cellulitis and abscess   DM (diabetes mellitus), type 2, uncontrolled   AKI (acute kidney injury)   Hypokalemia    Time spent: 35 min Dachelle Molzahn L  [facility] Pager [phone]. If 7PM-7AM,  please contact night-coverage at [email or web address], password [email or web address] 01/28/2014, 2:24 PM  LOS: 2 days

## 2014-01-28 NOTE — Progress Notes (Signed)
Pt given information and this nurse demonstrated how to access DM videos.  Pt able to return with demonstration and accessed video with this nurse.  Pt voiced thanks and appreciation for information, ready to learn.

## 2014-01-28 NOTE — Progress Notes (Signed)
Inpatient Diabetes Program Recommendations  AACE/ADA: New Consensus Statement on Inpatient Glycemic Control (2013)  Target Ranges:  Prepandial:   less than 140 mg/dL      Peak postprandial:   less than 180 mg/dL (1-2 hours)      Critically ill patients:  140 - 180 mg/dL   Reason for Visit: Diabetes Coordinator consult  Diabetes history: none Outpatient Diabetes medications: none Current orders for Inpatient glycemic control: Novolog MODERATE correction scale TID   Note: Spoke with patient regarding the new diagnosis of diabetes.  States that no one in family has the "sugar".  HgbA1C was 10.4%. Does not have a PCP. Seems to be mentally a little slow.  Patient was able to read a few words out of the newspaper. Spoke with staff RN about some education for him.  Will order Living Well with Diabetes booklet, will have dietician see him, will need to learn how to check own blood sugars.  Can watch DM videos while in hospital. Will need a PCP at discharge. Does not have health insurance.  Will continue to follow.  Smith MinceKendra Alexsys Eskin RN BSN CDE

## 2014-01-28 NOTE — Progress Notes (Signed)
Note that hgbA1C is 10.4%.  Diagnosis of diabetes by the American Diabetes Association is 6.5%.  Patient's A1C would be indication for diagnosis of diabetes.  Will follow.   Smith MinceKendra Arty Lantzy RN BSN CDE

## 2014-01-28 NOTE — Plan of Care (Signed)
Problem: Food- and Nutrition-Related Knowledge Deficit (NB-1.1) Goal: Nutrition education Formal process to instruct or train a patient/client in a skill or to impart knowledge to help patients/clients voluntarily manage or modify food choices and eating behavior to maintain or improve health. Outcome: Completed/Met Date Met:  01/28/14  RD consulted for nutrition education regarding diabetes.     Lab Results  Component Value Date    HGBA1C 10.4* 01/27/2014    RD provided "Balanced Plate" handout. Discussed different food groups and their effects on blood sugar, emphasizing carbohydrate-containing foods. Provided list of carbohydrates and recommended serving sizes of common foods.  Discussed importance of controlled and consistent carbohydrate intake throughout the day. Provided examples of ways to balance meals/snacks and encouraged intake of high-fiber, whole grain complex carbohydrates. Teach back method used.  Expect fair compliance.  Body mass index is 33.49 kg/(m^2). Pt meets criteria for Obese Class I based on current BMI.  Current diet order is Soft, patient is consuming approximately 100% of meals at this time. Labs and medications reviewed. No further nutrition interventions warranted at this time. RD contact information provided. If additional nutrition issues arise, please re-consult RD.  Inda Coke MS, RD, LDN Inpatient Registered Dietitian Pager: [phone] After-hours pager: [phone]

## 2014-01-28 NOTE — Clinical Documentation Improvement (Signed)
Possible Clinical Conditions?     Diabetes Mellitus Diabetes Insipidus                               Other Condition                Cannot Clinically Determine   Supporting Information: Risk Factors:  01/27/13 Hemoglobin A1C <5.7 % 10.4 (H)     Thank You, Nevin BloodgoodJoan B Cray Monnin, RN, BSN, CCDS, Clinical Documentation Specialist:  [phone]   Cell=[phone] [place] Information Management

## 2014-01-29 LAB — GLUCOSE, CAPILLARY
GLUCOSE-CAPILLARY: 105 mg/dL — AB (ref 70–99)
Glucose-Capillary: 164 mg/dL — ABNORMAL HIGH (ref 70–99)
Glucose-Capillary: 130 mg/dL — ABNORMAL HIGH (ref 70–99)

## 2014-01-29 NOTE — Progress Notes (Signed)
PROGRESS NOTE  Jesse [name]:[id number] DOB: 02/11/1961 DOA: 01/26/2014 PCP: No PCP Per Patient  Assessment/Plan: Facial cellulitis  Per patient, improving on unasyn. Still with extensive left jaw edema   Dental abscess/infected dental caries  Dr. Kristin [name] to see monday  DM: new dx. Better on glipizide  Hypokalemia -replete  AKI resolved   Code Status: full Family Communication:  Disposition Plan:    Consultants:  Kulinski  Procedures:     HPI/Subjective: Feels much improved. Able to tolerate solids   Objective: Filed Vitals:   01/29/14 1345  BP: 118/75  Pulse: 84  Temp: 98.4 F (36.9 C)  Resp: 18    Intake/Output Summary (Last 24 hours) at 01/29/14 1420 Last data filed at 01/29/14 1346  Gross per 24 hour  Intake   1040 ml  Output      3 ml  Net   1037 ml   Filed Weights   01/26/14 1143  Weight: 108.863 kg (240 lb)    Exam:  HEENT: left face, jaw, and neck edema, slightly less. Dental carries and fractured teeth Cardiovascular: Normal rate, regular rhythm, S1 normal, S2 normal, normal heart sounds and intact distal pulses.  Pulmonary/Chest: Effort normal and breath sounds normal.  Abdominal: Soft. Bowel sounds are normal. There is no hepatosplenomegaly. There is no tenderness.  Ext no CCE   Data Reviewed: Basic Metabolic Panel:  Recent Labs Lab 01/26/14 1351 01/27/14 0530 01/28/14 0325  NA 137 137 139  K 4.0 3.5* 3.5*  CL 98 98 101  CO2  --  26 23  GLUCOSE 241* 206* 165*  BUN 11 13 10   CREATININE 1.30 1.45* 1.15  CALCIUM  --  8.9 9.0   Liver Function Tests: No results found for this basename: AST, ALT, ALKPHOS, BILITOT, PROT, ALBUMIN,  in the last 168 hours No results found for this basename: LIPASE, AMYLASE,  in the last 168 hours No results found for this basename: AMMONIA,  in the last 168 hours CBC:  Recent Labs Lab 01/26/14 1343 01/26/14 1351 01/27/14 0530 01/28/14 0325  WBC 13.5*  --  9.0 8.3  NEUTROABS  11.4*  --   --   --   HGB 15.8 16.7 14.4 13.9  HCT 44.2 49.0 40.0 39.8  MCV 95.1  --  95.0 95.0  PLT 165  --  145* 148*   Cardiac Enzymes: No results found for this basename: CKTOTAL, CKMB, CKMBINDEX, TROPONINI,  in the last 168 hours BNP (last 3 results) No results found for this basename: PROBNP,  in the last 8760 hours CBG:  Recent Labs Lab 01/28/14 1134 01/28/14 1652 01/28/14 2158 01/29/14 0733 01/29/14 1205  GLUCAP 130* 187* 94 164* 105*    No results found for this or any previous visit (from the past 240 hour(s)).   Studies: No results found.  Scheduled Meds: . ampicillin-sulbactam (UNASYN) IV  3 g Intravenous Q8H  . glipiZIDE  5 mg Oral QAC breakfast  . heparin  5,000 Units Subcutaneous 3 times per day  . insulin aspart  0-15 Units Subcutaneous TID WC   Continuous Infusions: . sodium chloride 20 mL/hr (01/29/14 0259)   Antibiotics Given (last 72 hours)   Date/Time Action Medication Dose Rate   01/26/14 2036 Given   Ampicillin-Sulbactam (UNASYN) 3 g in sodium chloride 0.9 % 100 mL IVPB 3 g 100 mL/hr   01/27/14 0425 Given   Ampicillin-Sulbactam (UNASYN) 3 g in sodium chloride 0.9 % 100 mL IVPB 3 g 100 mL/hr  01/27/14 1227 Given   Ampicillin-Sulbactam (UNASYN) 3 g in sodium chloride 0.9 % 100 mL IVPB 3 g 100 mL/hr   01/27/14 2041 Given   Ampicillin-Sulbactam (UNASYN) 3 g in sodium chloride 0.9 % 100 mL IVPB 3 g 100 mL/hr   01/28/14 0407 Given   Ampicillin-Sulbactam (UNASYN) 3 g in sodium chloride 0.9 % 100 mL IVPB 3 g 100 mL/hr   01/28/14 1155 Given   Ampicillin-Sulbactam (UNASYN) 3 g in sodium chloride 0.9 % 100 mL IVPB 3 g 100 mL/hr   01/28/14 2021 Given   Ampicillin-Sulbactam (UNASYN) 3 g in sodium chloride 0.9 % 100 mL IVPB 3 g 100 mL/hr   01/29/14 0257 Given   Ampicillin-Sulbactam (UNASYN) 3 g in sodium chloride 0.9 % 100 mL IVPB 3 g 100 mL/hr   01/29/14 1113 Given   Ampicillin-Sulbactam (UNASYN) 3 g in sodium chloride 0.9 % 100 mL IVPB 3 g 100 mL/hr      Time spent: 25 min Jesse [name] L  [facility] Pager [phone]. If 7PM-7AM, please contact night-coverage at [email or web address], password [facility] 01/29/2014, 2:20 PM  LOS: 3 days

## 2014-01-29 NOTE — Progress Notes (Signed)
ANTIBIOTIC CONSULT NOTE - FOLLOW UP  Pharmacy Consult for unasyn Indication: dental/facial abscess   No Known Allergies  Patient Measurements: Height: 5\' 11"  (180.3 cm) Weight: 240 lb (108.863 kg) IBW/kg (Calculated) : 75.3  Vital Signs: Temp: 99.1 F (37.3 C) (03/21 0530) Temp src: Oral (03/21 0530) BP: 137/74 mmHg (03/21 0530) Pulse Rate: 75 (03/21 0530) Intake/Output from previous day: 03/20 0701 - 03/21 0700 In: 800 [P.O.:360; I.V.:140; IV Piggyback:300] Out: 3 [Urine:3] Intake/Output from this shift: Total I/O In: 360 [P.O.:360] Out: -   Labs:  Recent Labs  01/26/14 1343 01/26/14 1351 01/27/14 0530 01/28/14 0325  WBC 13.5*  --  9.0 8.3  HGB 15.8 16.7 14.4 13.9  PLT 165  --  145* 148*  CREATININE  --  1.30 1.45* 1.15   Estimated Creatinine Clearance: 95.3 ml/min (by C-G formula based on Cr of 1.15). No results found for this basename: VANCOTROUGH, VANCOPEAK, VANCORANDOM, GENTTROUGH, GENTPEAK, GENTRANDOM, TOBRATROUGH, TOBRAPEAK, TOBRARND, AMIKACINPEAK, AMIKACINTROU, AMIKACIN,  in the last 72 hours   Microbiology: No results found for this or any previous visit (from the past 720 hour(s)).  Anti-infectives   Start     Dose/Rate Route Frequency Ordered Stop   01/26/14 1945  Ampicillin-Sulbactam (UNASYN) 3 g in sodium chloride 0.9 % 100 mL IVPB     3 g 100 mL/hr over 60 Minutes Intravenous Every 8 hours 01/26/14 1934     01/26/14 1345  clindamycin (CLEOCIN) IVPB 900 mg     900 mg 100 mL/hr over 30 Minutes Intravenous  Once 01/26/14 1332 01/26/14 1452      Assessment: 53 yo M presented to ED 3/17 with facial/dental swelling and pain starting ~1 week prior, then discharged with prescription for dental wash and PO antibiotics. He was unable to get RX filled and returned with Tmax in ER of 101.3. CT has revealed abscess.    Patient is on day 4 of unasyn and renal funcrtion is stable.   Plan:  -No unasyn dose changes needed -Consider defining length of  therapy?  Harland GermanAndrew Pama Roskos, Pharm D 01/29/2014 1:33 PM

## 2014-01-30 LAB — GLUCOSE, CAPILLARY
GLUCOSE-CAPILLARY: 129 mg/dL — AB (ref 70–99)
Glucose-Capillary: 145 mg/dL — ABNORMAL HIGH (ref 70–99)
Glucose-Capillary: 113 mg/dL — ABNORMAL HIGH (ref 70–99)
Glucose-Capillary: 174 mg/dL — ABNORMAL HIGH (ref 70–99)

## 2014-01-30 MED ORDER — AMOXICILLIN-POT CLAVULANATE 875-125 MG PO TABS
1.0000 | ORAL_TABLET | Freq: Two times a day (BID) | ORAL | Status: DC
Start: 2014-01-30 — End: 2014-01-31
  Administered 2014-01-30 – 2014-01-31 (×3): 1 via ORAL
  Filled 2014-01-30 (×4): qty 1

## 2014-01-30 NOTE — Progress Notes (Signed)
PROGRESS NOTE  Jesse [name] ZOX:[id number] DOB: 01/08/1961 DOA: 01/26/2014 PCP: No PCP Per Patient  Assessment/Plan: Facial cellulitis  Continues to improve. Change to po abx. augmentin  Dental abscess/infected dental caries  Dr. Kristin BruinsKulinski to see monday  DM: new dx. Better on glipizide  Hypokalemia -replete  AKI resolved   Code Status: full Family Communication:  Disposition Plan:    Consultants:  Kulinski  Procedures:     HPI/Subjective: Feels much improved. Able to tolerate solids   Objective: Filed Vitals:   01/30/14 1408  BP: 118/78  Pulse: 71  Temp: 98.3 F (36.8 C)  Resp: 20    Intake/Output Summary (Last 24 hours) at 01/30/14 1532 Last data filed at 01/29/14 1836  Gross per 24 hour  Intake    240 ml  Output      0 ml  Net    240 ml   Filed Weights   01/26/14 1143  Weight: 108.863 kg (240 lb)    Exam:  HEENT: left face, jaw, and neck edema, slightly less. Dental carries and fractured teeth Cardiovascular: Normal rate, regular rhythm, S1 normal, S2 normal, normal heart sounds and intact distal pulses.  Pulmonary/Chest: Effort normal and breath sounds normal.  Abdominal: Soft. Bowel sounds are normal. There is no hepatosplenomegaly. There is no tenderness.  Ext no CCE   Data Reviewed: Basic Metabolic Panel:  Recent Labs Lab 01/26/14 1351 01/27/14 0530 01/28/14 0325  NA 137 137 139  K 4.0 3.5* 3.5*  CL 98 98 101  CO2  --  26 23  GLUCOSE 241* 206* 165*  BUN 11 13 10   CREATININE 1.30 1.45* 1.15  CALCIUM  --  8.9 9.0   Liver Function Tests: No results found for this basename: AST, ALT, ALKPHOS, BILITOT, PROT, ALBUMIN,  in the last 168 hours No results found for this basename: LIPASE, AMYLASE,  in the last 168 hours No results found for this basename: AMMONIA,  in the last 168 hours CBC:  Recent Labs Lab 01/26/14 1343 01/26/14 1351 01/27/14 0530 01/28/14 0325  WBC 13.5*  --  9.0 8.3  NEUTROABS 11.4*  --   --   --     HGB 15.8 16.7 14.4 13.9  HCT 44.2 49.0 40.0 39.8  MCV 95.1  --  95.0 95.0  PLT 165  --  145* 148*   Cardiac Enzymes: No results found for this basename: CKTOTAL, CKMB, CKMBINDEX, TROPONINI,  in the last 168 hours BNP (last 3 results) No results found for this basename: PROBNP,  in the last 8760 hours CBG:  Recent Labs Lab 01/29/14 0733 01/29/14 1205 01/29/14 1702 01/30/14 0754 01/30/14 1145  GLUCAP 164* 105* 130* 145* 113*    No results found for this or any previous visit (from the past 240 hour(s)).   Studies: No results found.  Scheduled Meds: . ampicillin-sulbactam (UNASYN) IV  3 g Intravenous Q8H  . glipiZIDE  5 mg Oral QAC breakfast  . heparin  5,000 Units Subcutaneous 3 times per day   Continuous Infusions: . sodium chloride 20 mL/hr (01/29/14 0259)   Antibiotics Given (last 72 hours)   Date/Time Action Medication Dose Rate   01/27/14 2041 Given   Ampicillin-Sulbactam (UNASYN) 3 g in sodium chloride 0.9 % 100 mL IVPB 3 g 100 mL/hr   01/28/14 0407 Given   Ampicillin-Sulbactam (UNASYN) 3 g in sodium chloride 0.9 % 100 mL IVPB 3 g 100 mL/hr   01/28/14 1155 Given   Ampicillin-Sulbactam (UNASYN) 3 g in sodium  chloride 0.9 % 100 mL IVPB 3 g 100 mL/hr   01/28/14 2021 Given   Ampicillin-Sulbactam (UNASYN) 3 g in sodium chloride 0.9 % 100 mL IVPB 3 g 100 mL/hr   01/29/14 0257 Given   Ampicillin-Sulbactam (UNASYN) 3 g in sodium chloride 0.9 % 100 mL IVPB 3 g 100 mL/hr   01/29/14 1113 Given   Ampicillin-Sulbactam (UNASYN) 3 g in sodium chloride 0.9 % 100 mL IVPB 3 g 100 mL/hr   01/29/14 1902 Given   Ampicillin-Sulbactam (UNASYN) 3 g in sodium chloride 0.9 % 100 mL IVPB 3 g 100 mL/hr   01/30/14 0602 Given   Ampicillin-Sulbactam (UNASYN) 3 g in sodium chloride 0.9 % 100 mL IVPB 3 g 100 mL/hr   01/30/14 1135 Given   Ampicillin-Sulbactam (UNASYN) 3 g in sodium chloride 0.9 % 100 mL IVPB 3 g 100 mL/hr     Time spent: 15 min Bexton Haak L  [facility] Pager [phone]. If 7PM-7AM, please contact night-coverage at [email or web address], password [facility] 01/30/2014, 3:32 PM  LOS: 4 days

## 2014-01-31 ENCOUNTER — Inpatient Hospital Stay (HOSPITAL_COMMUNITY): Payer: Self-pay

## 2014-01-31 ENCOUNTER — Encounter (HOSPITAL_COMMUNITY): Payer: Self-pay | Admitting: Dentistry

## 2014-01-31 DIAGNOSIS — K122 Cellulitis and abscess of mouth: Secondary | ICD-10-CM

## 2014-01-31 DIAGNOSIS — R220 Localized swelling, mass and lump, head: Secondary | ICD-10-CM

## 2014-01-31 DIAGNOSIS — R221 Localized swelling, mass and lump, neck: Secondary | ICD-10-CM

## 2014-01-31 LAB — GLUCOSE, CAPILLARY
GLUCOSE-CAPILLARY: 89 mg/dL (ref 70–99)
Glucose-Capillary: 148 mg/dL — ABNORMAL HIGH (ref 70–99)

## 2014-01-31 MED ORDER — AMOXICILLIN-POT CLAVULANATE 875-125 MG PO TABS: 1.0000 | ORAL_TABLET | Freq: Two times a day (BID) | ORAL | Status: DC

## 2014-01-31 MED ORDER — ACETAMINOPHEN 325 MG PO TABS: 650.0000 mg | ORAL_TABLET | Freq: Four times a day (QID) | ORAL | Status: DC | PRN

## 2014-01-31 MED ORDER — IBUPROFEN 200 MG PO TABS: 400.0000 mg | ORAL_TABLET | Freq: Four times a day (QID) | ORAL | Status: DC | PRN

## 2014-01-31 MED ORDER — GLIPIZIDE 5 MG PO TABS
5.0000 mg | ORAL_TABLET | Freq: Every day | ORAL | Status: DC
Start: 2014-01-31 — End: 2016-01-14

## 2014-01-31 NOTE — Care Management Note (Signed)
  Page 1 of 1   01/31/2014     11:21:28 AM   CARE MANAGEMENT NOTE 01/31/2014  Patient:  Jesse [name],Jesse [name]   Account Number:  [id number]  Date Initiated:  01/31/2014  Documentation initiated by:  Ronny FlurryWILE,Shalane Florendo  Subjective/Objective Assessment:     Action/Plan:   Anticipated DC Date:  01/31/2014   Anticipated DC Plan:        DC Planning Services  CM consult  [facility]      Choice offered to / List presented to:             Status of service:  Completed, signed off Medicare Important Message given?   (If response is "NO", the following Medicare IM given date fields will be blank) Date Medicare IM given:   Date Additional Medicare IM given:    Discharge Disposition:    Per UR Regulation:    If discussed at Long Length of Stay Meetings, dates discussed:    Comments:  01-31-14 Nita with pharmacy [facility] with [facility] , does not cover pain medication. Ronny FlurryHeather Lis Savitt RN BSN      01-31-14 Patient discharging to home today , emailed [facility] to call patient directly with follow up appointnment . Ronny FlurryHeather Emy Angevine RN SBn [phone]

## 2014-01-31 NOTE — Progress Notes (Signed)
Inpatient Diabetes Program Recommendations  AACE/ADA: New Consensus Statement on Inpatient Glycemic Control (2013)  Target Ranges:  Prepandial:   less than 140 mg/dL      Peak postprandial:   less than 180 mg/dL (1-2 hours)      Critically ill patients:  140 - 180 mg/dL   Reason for Visit: Patient with New Onset diabetes.  Discussed discharge plan with patient including referral for outpatient diabetes education.  [facility]  He asked about what a normal CBG level was.  Explained that 80-120 was normal and also discussed glucose meter options including the Reli-On meter from [employer].  Also briefly discussed dietary modifications including " limiting sugar in his drinks".  He has watched the videos and states that he has no further questions at this time.    Thanks, Beryl MeagerJenny Berkley Cronkright, RN, BC-ADM Inpatient Diabetes Coordinator Pager [phone]

## 2014-01-31 NOTE — Plan of Care (Signed)
Problem: Diagnosis - Diabetes Mellitus Goal: Diabetes education reinforced and continued Outcome: Adequate for Discharge Note patient being discharged.  New diagnosis of diabetes.  [facility]  Gave him information regarding glucose meter (generic from [employer]  He states that he has watched the diabetes videos and had great questions regarding "What is a normal blood sugar".  Will need lots of reinforcement.  Placed order for outpatient diabetes education per MD order.

## 2014-01-31 NOTE — Consult Note (Signed)
DENTAL CONSULTATION  Date of Consultation:  01/31/2014 Patient Name:   Jesse [name] Date of Birth:   04/16/1961 Medical Record Number: [id number]  VITALS: BP 154/75  Pulse 68  Temp(Src) 98.4 F (36.9 C) (Oral)  Resp 18  Ht 5\' 11"  (1.803 m)  Wt 240 lb (108.863 kg)  BMI 33.49 kg/m2  SpO2 97%   CHIEF COMPLAINT: The patient was referred by Dr. Lendell CapriceSullivan for a dental consultation for evaluation of right facial swelling.  HPI: Jesse [name] is a 53 year old male recently admitted with right facial swelling. A dental consultation was subsequently requested for evaluation of dental etiology and to provide treatment as indicated. Patient also was recently diagnosed with diabetes mellitus that is uncontrolled. Patient is now seen to rule out dental infection that may affect the patient's systemic health and his overall diabetes control.  Patient has a history of facial swelling starting last week. Patient eventually presented to the emergency room and was admitted and placed on IV antibiotic therapy. The facial swelling was thought to be secondary to the dentition of the lower right quadrant. Patient indicates that he does not have a regular primary dentist. Patient has not seen a dentist for " a long time" .   PROBLEM LIST: Patient Active Problem List   Diagnosis Date Noted  . Hypokalemia 01/28/2014  . DM (diabetes mellitus), type 2, uncontrolled 01/27/2014  . AKI (acute kidney injury) 01/27/2014  . Leukocytosis, unspecified 01/27/2014  . Facial cellulitis 01/26/2014  . Dental abscess 01/26/2014  . Infected dental carries 01/26/2014  . Cellulitis and abscess 01/26/2014    PMH: History reviewed. No pertinent past medical history.  PSH: History reviewed. No pertinent past surgical history.  ALLERGIES: No Known Allergies  MEDICATIONS: Current Facility-Administered Medications  Medication Dose Route Frequency Provider Last Rate Last Dose  . 0.9 %  sodium chloride infusion    Intravenous Continuous Christiane Haorinna L Sullivan, MD   20 mL/hr at 01/29/14 0259  . acetaminophen (TYLENOL) tablet 650 mg  650 mg Oral Q6H PRN Clydia LlanoMutaz Elmahi, MD   650 mg at 01/27/14 0419   Or  . acetaminophen (TYLENOL) suppository 650 mg  650 mg Rectal Q6H PRN Clydia LlanoMutaz Elmahi, MD      . amoxicillin-clavulanate (AUGMENTIN) 875-125 MG per tablet 1 tablet  1 tablet Oral Q12H Christiane Haorinna L Sullivan, MD   1 tablet at 01/30/14 2151  . benzocaine (ORAJEL) 10 % mucosal gel 1 application  1 application Mouth/Throat QID PRN Clydia LlanoMutaz Elmahi, MD      . glipiZIDE (GLUCOTROL) tablet 5 mg  5 mg Oral QAC breakfast Christiane Haorinna L Sullivan, MD   5 mg at 01/30/14 0820  . heparin injection 5,000 Units  5,000 Units Subcutaneous 3 times per day Clydia LlanoMutaz Elmahi, MD   5,000 Units at 01/30/14 2151  . ibuprofen (ADVIL,MOTRIN) tablet 400 mg  400 mg Oral Q6H PRN Christiane Haorinna L Sullivan, MD   400 mg at 01/28/14 1609  . ondansetron (ZOFRAN) tablet 4 mg  4 mg Oral Q6H PRN Clydia LlanoMutaz Elmahi, MD       Or  . ondansetron (ZOFRAN) injection 4 mg  4 mg Intravenous Q6H PRN Clydia LlanoMutaz Elmahi, MD      . oxyCODONE (Oxy IR/ROXICODONE) immediate release tablet 5 mg  5 mg Oral Q4H PRN Clydia LlanoMutaz Elmahi, MD   5 mg at 01/29/14 2317    LABS: Lab Results  Component Value Date   WBC 8.3 01/28/2014   HGB 13.9 01/28/2014   HCT 39.8 01/28/2014   MCV  95.0 01/28/2014   PLT 148* 01/28/2014      Component Value Date/Time   NA 139 01/28/2014 0325   K 3.5* 01/28/2014 0325   CL 101 01/28/2014 0325   CO2 23 01/28/2014 0325   GLUCOSE 165* 01/28/2014 0325   BUN 10 01/28/2014 0325   CREATININE 1.15 01/28/2014 0325   CALCIUM 9.0 01/28/2014 0325   GFRNONAA 72* 01/28/2014 0325   GFRAA 83* 01/28/2014 0325   No results found for this basename: INR, PROTIME   No results found for this basename: PTT    SOCIAL HISTORY: History   Social History  . Marital Status: Single    Spouse Name: N/A    Number of Children: N/A  . Years of Education: N/A   Occupational History  . Not on file.   Social  History Main Topics  . Smoking status: Never Smoker   . Smokeless tobacco: Never Used  . Alcohol Use: 1.2 oz/week    2 Cans of beer per week     Comment: Occasional  . Drug Use: No  . Sexual Activity: Not on file   Other Topics Concern  . Not on file   Social History Narrative  . No narrative on file    FAMILY HISTORY: History reviewed. No pertinent family history.   REVIEW OF SYSTEMS: Reviewed from chart for this admission.  DENTAL HISTORY: CHIEF COMPLAINT: The patient was referred by Dr. Lendell Caprice for a dental consultation for evaluation of right facial swelling.  HPI: Jesse [name] is a 53 year old male recently admitted with right facial swelling. A dental consultation was subsequently requested for evaluation of dental etiology and to provide treatment as indicated. Patient also was recently diagnosed with diabetes mellitus that is uncontrolled. Patient is now seen to rule out dental infection that may affect the patient's systemic health and his overall diabetes control.  Patient has a history of facial swelling starting last week. Patient eventually presented to the emergency room and was admitted and placed on IV antibiotic therapy. The facial swelling was thought to be secondary to the dentition of the lower right quadrant. Patient indicates that he does not have a regular primary dentist. Patient has not seen a dentist for " a long time" .   DENTAL EXAMINATION:  GENERAL: The patient is a well-developed, well-nourished male in no acute distress. HEAD AND NECK: Patient has significant right neck lymphadenopathy that is tender to palpation. .  The patient also has some right facial swelling that has decreased significantly by patient report. Patient denies acute TMJ symptoms. Patient is able to open approximately 25-30 mm. INTRAORAL EXAM: Patient has normal saliva. There appears to be evidence of right facial swelling of the buccal vestibule. DENTITION: Patient with  multiple missing teeth and multiple retained root segments. I will need an orthopantogram to identify the extent of the missing/present teeth. PERIODONTAL: Patient has chronic periodontitis with plaque and calculus accumulations, selective areas gingival recession and some incipient tooth mobility. DENTAL CARIES/SUBOPTIMAL RESTORATIONS: There are multiple dental caries involving multiple teeth. ENDODONTIC: Patient with a history of acute pulpitis symptoms. Patient also has buccal abscess involving the lower right quadrant. Periapical pathology is noted involving tooth numbers 29 and 30. CROWN AND BRIDGE: There are no crown or bridge restorations noted PROSTHODONTIC: Patient denies having partial dentures. OCCLUSION: Patient has a poor occlusal scheme secondary to multiple missing teeth, multiple retained root segments, supra-eruption and drifting of the unopposed teeth into the edentulous areas, and lack of replacement of missing teeth  with dental prostheses.  RADIOGRAPHIC INTERPRETATION: An orthopantogram has been ordered thru radiology. There are multiple missing teeth. There are multiple retained root segments. There is incipient to moderate bone loss. There is evidence of periapical radiolucency associated with tooth numbers 29 and 30. There is supra-eruption and drifting of the unopposed teeth into the edentulous areas.   ASSESSMENTS: 1. Right facial swelling secondary to dental etiology. 2. Buccal abscess. #29 and 30. 3. Chronic apical periodontitis 4  Multiple retained root segments. 5. Multiple dental caries 6. Chronic periodontitis with bone loss 7. Gingival recession 8. Accretions 9. Missing teeth 10. Poor occlusal scheme and malocclusion 11. Oral neglect 12.  Current thrombocytopenia with the risk for bleeding with invasive dental procedures.  PLAN/RECOMMENDATIONS: 1. I discussed the risks, benefits, and complications of various treatment options with the patient in  relationship to his medical and dental conditions, right facial swelling, and dental infection. We discussed various treatment options to include no treatment, multiple extractions with alveoloplasty, pre-prosthetic surgery as indicated, periodontal therapy, dental restorations, root canal therapy, crown and bridge therapy, implant therapy, and replacement of missing teeth as indicated. The patient currently wishes to proceed with multiple extractions with alveoloplasty and pre-prosthetic surgery as indicated in the operating room with general anesthesia. I will also perform gross debridement of remaining dentition as time and space permits. Patient is currently contemplating being discharged and then returning for the operating room procedure. Patient is aware that this operating room procedure has been scheduled for tomorrow morning at 7:30 am.  Patient understands this and if he is unable to make that operating room procedure, he will need to followup with oral surgeon of his choice for dental treatment. Patient also understands that he will need to follow up with a primary dentist to obtain an exam, radiographs and overall dental treatment planning as indicated for other treatment needs.   2. Discussion of findings with medical team and coordination of future medical and dental care as needed.   Charlynne Pander, DDS

## 2014-01-31 NOTE — Progress Notes (Signed)
DC HOME, VERBALLY UNDERSTOOD DC INSTRUCTIONS, NO QUESTIONS ASK, INSTRUCTED TO HAVE NOTHING BY MOUTH AFTER MN TONIGHT.

## 2014-01-31 NOTE — Discharge Summary (Signed)
Physician Discharge Summary  Jesse [name] ZOX:[id number] DOB: 09/12/1961 DOA: 01/26/2014  PCP: No PCP Per Patient  Admit date: 01/26/2014 Discharge date: 01/31/2014  Time spent: greater than 30 minutes  Recommendations for Outpatient Follow-up:  1. To shortstay tomorrow for extraction/ by Dr. Kristin BruinsKulinski  Discharge Diagnoses:  Principal Problem:   Facial cellulitis Active Problems:   Dental abscess   Cellulitis and abscess   DM (diabetes mellitus), type 2, uncontrolled, new diagnosis   AKI (acute kidney injury)   Hypokalemia   Discharge Condition: stable  Filed Weights   01/26/14 1143  Weight: 108.863 kg (240 lb)    History of present illness:  53 y.o. male with no significant past medical history came to the hospital because of facial swelling. Patient symptoms started about a week ago with right-sided facial swelling, associated with pain subjective fever. Patient came in to the emergency department 2 days ago discharged on antibiotics and dental wash. Came in back today febrile, with leukocytosis.  Initial evaluation in the emergency department with CT scan showed facial swelling associated with a dental abscess.  Hospital Course:  Admitted to medsurg.  Started on unasyn. Initially, EDP reported [facility] on call would consult.  I spoke to Dr. Chales Salmonwsley after several days. He declined consult, and recommended consult Dr. Robin SearingKulinsky.  Pt seen by Dr. Robin SearingKulinsky who plans to take pt to OR tomorrow.  Patient currently demanding discharge.  Facial cellulitis much improved. Discharged with PO abx.  Also, noted to be hyperglycemic. HGB A1C >10. DM is new diagnosis.  Started on glipizide with good results. Given diabetic teaching  Procedures: None  Consultations:  Kulinski  Discharge Exam: Filed Vitals:   01/31/14 0621  BP: 154/75  Pulse: 68  Temp: 98.4 F (36.9 C)  Resp: 18    General: nontoxic HEENT: right face and neck swelling improved. Still with hard, nodular area along left  jaw.  Discharge Instructions  Discharge Orders   Future Orders Complete By Expires   Activity as tolerated - No restrictions  As directed    Diet Carb Modified  As directed    Discharge instructions  As directed    Comments:     Nothing to eat after midnight tonight. [facility]. Do not take any medications the morning of surgery.       Medication List    STOP taking these medications       benzocaine 10 % mucosal gel  Commonly known as:  ORAJEL     lidocaine 2 % solution  Commonly known as:  XYLOCAINE     penicillin v potassium 500 MG tablet  Commonly known as:  VEETID      TAKE these medications       acetaminophen 325 MG tablet  Commonly known as:  TYLENOL  Take 2 tablets (650 mg total) by mouth every 6 (six) hours as needed for mild pain (or Fever >/= 101).     amoxicillin-clavulanate 875-125 MG per tablet  Commonly known as:  AUGMENTIN  Take 1 tablet by mouth every 12 (twelve) hours.     glipiZIDE 5 MG tablet  Commonly known as:  GLUCOTROL  Take 1 tablet (5 mg total) by mouth daily before breakfast.     ibuprofen 200 MG tablet  Commonly known as:  ADVIL,MOTRIN  Take 2 tablets (400 mg total) by mouth every 6 (six) hours as needed for fever or mild pain.  No Known Allergies     Follow-up Information   Follow up with [facility]    . (for diabetes)    Contact information:   [address] [place] [address] [phone]      Follow up with [facility] SHORT STAY. (tomorrow at 6 am)    Contact information:   [address] [id number] [place] [address] [phone]       The results of significant diagnostics from this hospitalization (including imaging, microbiology, ancillary and laboratory) are listed below for reference.    Significant Diagnostic Studies: Dg Orthopantogram  01/31/2014   CLINICAL  DATA:  Lower facial swelling.  EXAM: ORTHOPANTOGRAM/PANORAMIC  COMPARISON:  None.  FINDINGS: The patient is missing multiple mandibular and maxillary teeth. Multiple dental caries noted. Slight lucency noted around the right second premolar and first molar suggesting early periapical abscesses.  No mandibular fracture or other bony abnormality.  IMPRESSION: Extensive dental caries.  Early periapical abscesses adjacent to the right lower second premolar and first molar.   Electronically Signed   By: Charlett Nose M.D.   On: 01/31/2014 08:13   Ct Soft Tissue Neck W Contrast  01/26/2014   CLINICAL DATA:  Dental pain of 1 month duration. Right-sided facial swelling. Fever.  EXAM: CT NECK WITH CONTRAST  TECHNIQUE: Multidetector CT imaging of the neck was performed using the standard protocol following the bolus administration of intravenous contrast.  CONTRAST:  OMNIPAQUE IOHEXOL 300 MG/ML  SOLN  COMPARISON:  None.  FINDINGS: Lung apices are clear. No superior mediastinal pathology. Visualized intracranial contents are unremarkable.  Both parotid glands are normal. Both submandibular glands are normal. The thyroid gland is normal.  There is soft tissue swelling of the right side of the lower face and the right side of the neck. There is nonspecific stranding in the subcutaneous fat consistent with cellulitis. There is a crescentic low density collection adjacent to the body of the mandible on the right (outer side) measuring 23 x 8 mm consistent with an adjacent abscess. There is low density adjacent to the roots of teeth numbers 20 and 19 consistent with root abscesses. There is erosion of the lateral margin of the mandible responsible for the creation of the adjacent abscess. Elsewhere, multiple caries are evident with evidence of periodontal disease. There are reactive lymph nodes in the right side of the neck but no side operative nodes. No evidence of deep space extension.  IMPRESSION: Extensive dental and  periodontal disease. Root abcesses of the right mandibular dentition, teeth numbers 19 and 20. Erosion of the lateral margin of the mandible with an adjacent abscess measuring 23 x 8 mm. Nonspecific cellulitis of the right side of the face and neck. No deep space extension.   Electronically Signed   By: Paulina Fusi M.D.   On: 01/26/2014 17:08    Microbiology: No results found for this or any previous visit (from the past 240 hour(s)).   Labs: Basic Metabolic Panel:  Recent Labs Lab 01/26/14 1351 01/27/14 0530 01/28/14 0325  NA 137 137 139  K 4.0 3.5* 3.5*  CL 98 98 101  CO2  --  26 23  GLUCOSE 241* 206* 165*  BUN 11 13 10   CREATININE 1.30 1.45* 1.15  CALCIUM  --  8.9 9.0   Liver Function Tests: No results found for this basename: AST, ALT, ALKPHOS, BILITOT, PROT, ALBUMIN,  in the last 168 hours No results found for this  basename: LIPASE, AMYLASE,  in the last 168 hours No results found for this basename: AMMONIA,  in the last 168 hours CBC:  Recent Labs Lab 01/26/14 1343 01/26/14 1351 01/27/14 0530 01/28/14 0325  WBC 13.5*  --  9.0 8.3  NEUTROABS 11.4*  --   --   --   HGB 15.8 16.7 14.4 13.9  HCT 44.2 49.0 40.0 39.8  MCV 95.1  --  95.0 95.0  PLT 165  --  145* 148*   Cardiac Enzymes: No results found for this basename: CKTOTAL, CKMB, CKMBINDEX, TROPONINI,  in the last 168 hours BNP: BNP (last 3 results) No results found for this basename: PROBNP,  in the last 8760 hours CBG:  Recent Labs Lab 01/30/14 0754 01/30/14 1145 01/30/14 1732 01/30/14 2114 01/31/14 0746  GLUCAP 145* 113* 129* 174* 148*      Signed:  Azalya Galyon L  [facility] 01/31/2014, 10:22 AM

## 2014-02-01 ENCOUNTER — Encounter (HOSPITAL_COMMUNITY): Payer: Self-pay | Admitting: Anesthesiology

## 2014-02-01 ENCOUNTER — Telehealth (HOSPITAL_COMMUNITY): Payer: Self-pay | Admitting: Dentistry

## 2014-02-01 ENCOUNTER — Ambulatory Visit: Admit: 2014-02-01 | Payer: Self-pay | Admitting: Dentistry

## 2014-02-01 SURGERY — MULTIPLE EXTRACTION WITH ALVEOLOPLASTY
Anesthesia: General | Site: Mouth

## 2014-02-01 MED ORDER — EPHEDRINE SULFATE 50 MG/ML IJ SOLN
INTRAMUSCULAR | Status: AC
  Filled 2014-02-01: qty 1

## 2014-02-01 MED ORDER — GLYCOPYRROLATE 0.2 MG/ML IJ SOLN
INTRAMUSCULAR | Status: AC
  Filled 2014-02-01: qty 2

## 2014-02-01 MED ORDER — LIDOCAINE HCL (CARDIAC) 20 MG/ML IV SOLN
INTRAVENOUS | Status: AC
  Filled 2014-02-01: qty 5

## 2014-02-01 MED ORDER — ARTIFICIAL TEARS OP OINT
TOPICAL_OINTMENT | OPHTHALMIC | Status: AC
  Filled 2014-02-01: qty 3.5

## 2014-02-01 MED ORDER — PROPOFOL 10 MG/ML IV BOLUS
INTRAVENOUS | Status: AC
  Filled 2014-02-01: qty 20

## 2014-02-01 MED ORDER — FENTANYL CITRATE 0.05 MG/ML IJ SOLN
INTRAMUSCULAR | Status: AC
  Filled 2014-02-01: qty 5

## 2014-02-01 MED ORDER — ONDANSETRON HCL 4 MG/2ML IJ SOLN
INTRAMUSCULAR | Status: AC
  Filled 2014-02-01: qty 2

## 2014-02-01 MED ORDER — NEOSTIGMINE METHYLSULFATE 1 MG/ML IJ SOLN
INTRAMUSCULAR | Status: AC
  Filled 2014-02-01: qty 10

## 2014-02-01 MED ORDER — MIDAZOLAM HCL 2 MG/2ML IJ SOLN
INTRAMUSCULAR | Status: AC
  Filled 2014-02-01: qty 2

## 2014-02-01 MED ORDER — ROCURONIUM BROMIDE 50 MG/5ML IV SOLN
INTRAVENOUS | Status: AC
  Filled 2014-02-01: qty 1

## 2014-02-01 NOTE — Telephone Encounter (Signed)
02/01/2014  Patient:            Jesse [name] Date of Birth:  01/14/1961 MRN:                [id number]  I received page from Short Stay at 6:00 am. I then called Short Stay to find that patient had not shown for surgery this morning. OR was held active until 7:15 am and then cancelled secondary to patient NO SHOW-NO CALL. Patient will NOT be rescheduled for dental procedures with Dental Medicine and is to follow up with an oral surgeon or other Dentist of his choice for dental treatment.  Charlynne Panderonald F. Kulinski, DDS

## 2014-02-01 NOTE — Progress Notes (Signed)
Received work from Dr. Robin SearingKulinsky pt NO SHOW for dental procedure.  Crista Curborinna Mat Stuard, M.D.

## 2014-02-01 NOTE — Anesthesia Preprocedure Evaluation (Deleted)
Anesthesia Evaluation Anesthesia Physical Anesthesia Plan Anesthesia Quick Evaluation  

## 2014-02-01 NOTE — Preoperative (Signed)
Beta Blockers   Reason not to administer Beta Blockers:Not Applicable 

## 2014-02-13 NOTE — ED Provider Notes (Signed)
Medical screening examination/treatment/procedure(s) were performed by non-physician practitioner and as supervising physician I was immediately available for consultation/collaboration.   EKG Interpretation None        Devetta Hagenow B. Belvia Gotschall, MD 02/13/14 1107 

## 2014-03-03 ENCOUNTER — Encounter (HOSPITAL_COMMUNITY): Payer: Self-pay | Admitting: Emergency Medicine

## 2014-03-03 ENCOUNTER — Emergency Department (HOSPITAL_COMMUNITY)
Admission: EM | Admit: 2014-03-03 | Discharge: 2014-03-03 | Disposition: A | Discharge status: Home / Self Care | Payer: No Typology Code available for payment source | Attending: Emergency Medicine | Admitting: Emergency Medicine

## 2014-03-03 DIAGNOSIS — Z79899 Other long term (current) drug therapy: Secondary | ICD-10-CM | POA: Insufficient documentation

## 2014-03-03 DIAGNOSIS — Z872 Personal history of diseases of the skin and subcutaneous tissue: Secondary | ICD-10-CM | POA: Insufficient documentation

## 2014-03-03 DIAGNOSIS — Z792 Long term (current) use of antibiotics: Secondary | ICD-10-CM | POA: Insufficient documentation

## 2014-03-03 DIAGNOSIS — K029 Dental caries, unspecified: Secondary | ICD-10-CM | POA: Insufficient documentation

## 2014-03-03 DIAGNOSIS — R209 Unspecified disturbances of skin sensation: Secondary | ICD-10-CM | POA: Insufficient documentation

## 2014-03-03 DIAGNOSIS — R7309 Other abnormal glucose: Secondary | ICD-10-CM | POA: Insufficient documentation

## 2014-03-03 DIAGNOSIS — K08109 Complete loss of teeth, unspecified cause, unspecified class: Secondary | ICD-10-CM | POA: Insufficient documentation

## 2014-03-03 DIAGNOSIS — R739 Hyperglycemia, unspecified: Secondary | ICD-10-CM

## 2014-03-03 DIAGNOSIS — Z791 Long term (current) use of non-steroidal anti-inflammatories (NSAID): Secondary | ICD-10-CM | POA: Insufficient documentation

## 2014-03-03 DIAGNOSIS — R202 Paresthesia of skin: Secondary | ICD-10-CM

## 2014-03-03 LAB — CBG MONITORING, ED: Glucose-Capillary: 150 mg/dL — ABNORMAL HIGH (ref 70–99)

## 2014-03-03 NOTE — Discharge Instructions (Signed)
Read the information below.  You may return to the Emergency Department at any time for worsening condition or any new symptoms that concern you.  Please take your diabetes medications as directed.  Follow up with your dentist.  [facility] for a follow up appointment.  If you develop weakness or numbness in your face, have difficulty speaking or thinking of words, have swelling or pain in your face, or difficulty swallowing or breathing, return to the ER immediately for a recheck.   Paresthesia Paresthesia is a burning or prickling feeling. This feeling can happen in any part of the body. It often happens in the hands, arms, legs, or feet. HOME CARE  Avoid drinking alcohol.  Try massage or needle therapy (acupuncture) to help with your problems.  Keep all doctor visits as told. GET HELP RIGHT AWAY IF:   You feel weak.  You have trouble walking or moving.  You have problems speaking or seeing.  You feel confused.  You cannot control when you poop (bowel movement) or pee (urinate).  You lose feeling (numbness) after an injury.  You pass out (faint).  Your burning or prickling feeling gets worse when you walk.  You have pain, cramps, or feel dizzy.  You have a rash. MAKE SURE YOU:   Understand these instructions.  Will watch your condition.  Will get help right away if you are not doing well or get worse. Document Released: 10/10/2008 Document Revised: 01/20/2012 Document Reviewed: 07/19/2011 [facility] Patient Information 2014 [place], [place].

## 2014-03-03 NOTE — ED Notes (Signed)
Pt arrives from home with c/o dental pain possibly r/t previous dx of dental abscess on right lower side of mouth. States he was in the hospital for 5 days for abscess in his mouth about a month ago and pain has gotten worse. States he visited the dentist two weeks ago who stated it looked swollen but no visible abscess, states pain has gotten worse since then. NAD, A/Ox4

## 2014-03-03 NOTE — ED Provider Notes (Signed)
CSN: [id number]     Arrival date & time 03/03/14  1837 History  This chart was scribed for non-physician practitioner Jesse DredgeEmily Kaelan Amble, PA-C working with Audree CamelScott T Goldston, MD by Joaquin MusicKristina Sanchez-Matthews, ED Scribe. This patient was seen in room WTR6/WTR6 and the patient's care was started at 7:23 PM .   Chief Complaint  Patient presents with  . Dental Pain   The history is provided by the patient. No language interpreter was used.   HPI Comments: Jesse [name] is a 53 y.o. male who presents to the Emergency Department complaining of R sided dental pain that began about a month ago. Pt states he was hospitalized last month for an abscess and states his pain has worsened. States he visited the dentist two weeks ago, had 2 teeth extracted and was told there was no visible abscess or swelling at the time.  He still feels that the right side of his face feels softer than the left and he is worried there is pus in his face.  He states he was referred to an oral surgeon for 03/09/2014 and further tx will be decided at that point.  He states he has had numb/tingling sensation to lower lip that has been present for one month, since he was admitted for the dental abscess and facial cellulitis. He denies having a PCP. Pt denies fever, chills, body aches, trouble breathing or swallowing, SOB, and recent illnesses.  He states he was discharged with Glucotrol and was taking it, needs to pick up a refill which he has not yet due to cost.    3/18-23/2015: Pt was admitted for facial cellulitis and dental abscess. Scheduled for extractions with Dr. Kristin BruinsKulinski. Pt was a no-show to dental surgery 02/01/2014.  History reviewed. No pertinent past medical history. History reviewed. No pertinent past surgical history. No family history on file. History  Substance Use Topics  . Smoking status: Never Smoker   . Smokeless tobacco: Never Used  . Alcohol Use: 1.2 oz/week    2 Cans of beer per week     Comment: Occasional     Review of Systems  HENT: Positive for dental problem. Negative for drooling and trouble swallowing.   Musculoskeletal: Negative for myalgias.  All other systems reviewed and are negative.  Allergies  Review of patient's allergies indicates no known allergies.  Home Medications   Prior to Admission medications   Medication Sig Start Date End Date Taking? Authorizing Provider  acetaminophen (TYLENOL) 325 MG tablet Take 2 tablets (650 mg total) by mouth every 6 (six) hours as needed for mild pain (or Fever >/= 101). 01/31/14   Christiane Haorinna L Sullivan, MD  amoxicillin-clavulanate (AUGMENTIN) 875-125 MG per tablet Take 1 tablet by mouth every 12 (twelve) hours. 01/31/14   Christiane Haorinna L Sullivan, MD  glipiZIDE (GLUCOTROL) 5 MG tablet Take 1 tablet (5 mg total) by mouth daily before breakfast. 01/31/14   Christiane Haorinna L Sullivan, MD  ibuprofen (ADVIL,MOTRIN) 200 MG tablet Take 2 tablets (400 mg total) by mouth every 6 (six) hours as needed for fever or mild pain. 01/31/14   Christiane Haorinna L Sullivan, MD   BP 166/101  Pulse 94  Temp(Src) 98.1 F (36.7 C) (Oral)  Resp 14  Wt 235 lb (106.595 kg)  SpO2 100%  Physical Exam  Nursing note and vitals reviewed. Constitutional: He is oriented to person, place, and time. He appears well-developed and well-nourished. No distress.  HENT:  Head: Normocephalic and atraumatic.  Decay to L upper molars, nontender.  Right lower dentition: lower second molar non-tender to palpation or percussion. Right lower Bicuspid and first molar removed. Mild tenderness of the gingiva where the teeth are missing. No tenderness to floor of mouth. No dental tenderness throughout. No facial swelling.  No induration or fluctuance.  No facial erythema, edema, or warmth.    Eyes: EOM are normal.  Neck: Neck supple. No tracheal deviation present.  Cardiovascular: Normal rate.   Pulmonary/Chest: Effort normal. No respiratory distress.  Musculoskeletal: Normal range of motion.  Neurological: He  is alert and oriented to person, place, and time. No cranial nerve deficit. GCS eye subscore is 4. GCS verbal subscore is 5. GCS motor subscore is 6.  Skin: Skin is warm and dry.  Psychiatric: He has a normal mood and affect. His behavior is normal.   ED Course  Procedures  DIAGNOSTIC STUDIES: Oxygen Saturation is 100% on RA, normal by my interpretation.    COORDINATION OF CARE: 7:32 PM-Discussed treatment plan which includes advised pt to F/U with dentist and explained to pt, abx is not necessary. Pt agreed to plan.   Labs Review Labs Reviewed - No data to display  Imaging Review No results found.   EKG Interpretation None     MDM   Final diagnoses:  Paresthesia  Hyperglycemia   Pt is having abnormal sensation of his right face and right lower lip that has been ongoing since he was admitted to the hospital last month for dental abscess and facial cellulitis.  He did have the teeth pulled after discharge from the hospital and the gingiva appears very healthy and without recurrent abscess.  There is no facial swelling.  Exam is unremarkable.  I suspect he has paresthesias, possibly nerve damage?, from the severe facial infection he had last month.  There is no cranial nerve deficit.  I doubt that this is caused by a stroke.  No other focal neurologic symptoms.  Pt advised to follow closely with his dentist, and [facility] Discussed findings, treatment, and follow up  with patient.  Pt given return precautions.  Pt verbalizes understanding and agrees with plan.      I personally performed the services described in this documentation, which was scribed in my presence. The recorded information has been reviewed and is accurate.   Jesse Dredgemily Channel Papandrea, PA-C 03/03/14 2027

## 2014-03-07 NOTE — ED Provider Notes (Signed)
Medical screening examination/treatment/procedure(s) were performed by non-physician practitioner and as supervising physician I was immediately available for consultation/collaboration.   EKG Interpretation None        Chihiro Frey T Triston Skare, MD 03/07/14 0715 

## 2016-01-14 ENCOUNTER — Encounter (HOSPITAL_COMMUNITY): Payer: Self-pay | Admitting: *Deleted

## 2016-01-14 ENCOUNTER — Emergency Department (HOSPITAL_COMMUNITY): Payer: Self-pay

## 2016-01-14 ENCOUNTER — Emergency Department (HOSPITAL_COMMUNITY)
Admission: EM | Admit: 2016-01-14 | Discharge: 2016-01-14 | Disposition: A | Discharge status: Home / Self Care | Payer: Self-pay | Attending: Emergency Medicine | Admitting: Emergency Medicine

## 2016-01-14 ENCOUNTER — Emergency Department (HOSPITAL_BASED_OUTPATIENT_CLINIC_OR_DEPARTMENT_OTHER): Payer: Self-pay

## 2016-01-14 DIAGNOSIS — M7989 Other specified soft tissue disorders: Secondary | ICD-10-CM

## 2016-01-14 DIAGNOSIS — E119 Type 2 diabetes mellitus without complications: Secondary | ICD-10-CM | POA: Insufficient documentation

## 2016-01-14 DIAGNOSIS — R700 Elevated erythrocyte sedimentation rate: Secondary | ICD-10-CM | POA: Insufficient documentation

## 2016-01-14 DIAGNOSIS — IMO0001 Reserved for inherently not codable concepts without codable children: Secondary | ICD-10-CM

## 2016-01-14 DIAGNOSIS — E1165 Type 2 diabetes mellitus with hyperglycemia: Secondary | ICD-10-CM

## 2016-01-14 DIAGNOSIS — R2242 Localized swelling, mass and lump, left lower limb: Secondary | ICD-10-CM | POA: Insufficient documentation

## 2016-01-14 LAB — COMPREHENSIVE METABOLIC PANEL
ALT: 31 U/L (ref 17–63)
ANION GAP: 12 (ref 5–15)
AST: 23 U/L (ref 15–41)
Albumin: 3.8 g/dL (ref 3.5–5.0)
Alkaline Phosphatase: 95 U/L (ref 38–126)
BUN: 12 mg/dL (ref 6–20)
CALCIUM: 9.7 mg/dL (ref 8.9–10.3)
CHLORIDE: 100 mmol/L — AB (ref 101–111)
CO2: 26 mmol/L (ref 22–32)
CREATININE: 1.24 mg/dL (ref 0.61–1.24)
Glucose, Bld: 222 mg/dL — ABNORMAL HIGH (ref 65–99)
POTASSIUM: 4.1 mmol/L (ref 3.5–5.1)
SODIUM: 138 mmol/L (ref 135–145)
TOTAL PROTEIN: 7.4 g/dL (ref 6.5–8.1)
Total Bilirubin: 0.7 mg/dL (ref 0.3–1.2)

## 2016-01-14 LAB — CBC WITH DIFFERENTIAL/PLATELET
Basophils Absolute: 0 10*3/uL (ref 0.0–0.1)
Basophils Relative: 1 %
EOS ABS: 0.1 10*3/uL (ref 0.0–0.7)
EOS PCT: 2 %
HCT: 43 % (ref 39.0–52.0)
Hemoglobin: 15 g/dL (ref 13.0–17.0)
LYMPHS ABS: 2.1 10*3/uL (ref 0.7–4.0)
LYMPHS PCT: 32 %
MCH: 32.6 pg (ref 26.0–34.0)
MCHC: 34.9 g/dL (ref 30.0–36.0)
MCV: 93.5 fL (ref 78.0–100.0)
MONO ABS: 0.7 10*3/uL (ref 0.1–1.0)
Monocytes Relative: 10 %
Neutro Abs: 3.8 10*3/uL (ref 1.7–7.7)
Neutrophils Relative %: 55 %
PLATELETS: 224 10*3/uL (ref 150–400)
RBC: 4.6 MIL/uL (ref 4.22–5.81)
RDW: 12.7 % (ref 11.5–15.5)
WBC: 6.8 10*3/uL (ref 4.0–10.5)

## 2016-01-14 LAB — SEDIMENTATION RATE: Sed Rate: 28 mm/hr — ABNORMAL HIGH (ref 0–16)

## 2016-01-14 LAB — BRAIN NATRIURETIC PEPTIDE: B Natriuretic Peptide: 9.9 pg/mL (ref 0.0–100.0)

## 2016-01-14 LAB — URIC ACID: URIC ACID, SERUM: 7 mg/dL (ref 4.4–7.6)

## 2016-01-14 IMAGING — DX DG FOOT COMPLETE 3+V*L*
3 series · 3 of 3 positions shown · non-contrast
Comparison: Ankle films, dictated separately.

CLINICAL DATA: Left foot pain and edema for 1 week without trauma.

EXAM:
LEFT FOOT - COMPLETE 3+ VIEW

[foot ap]
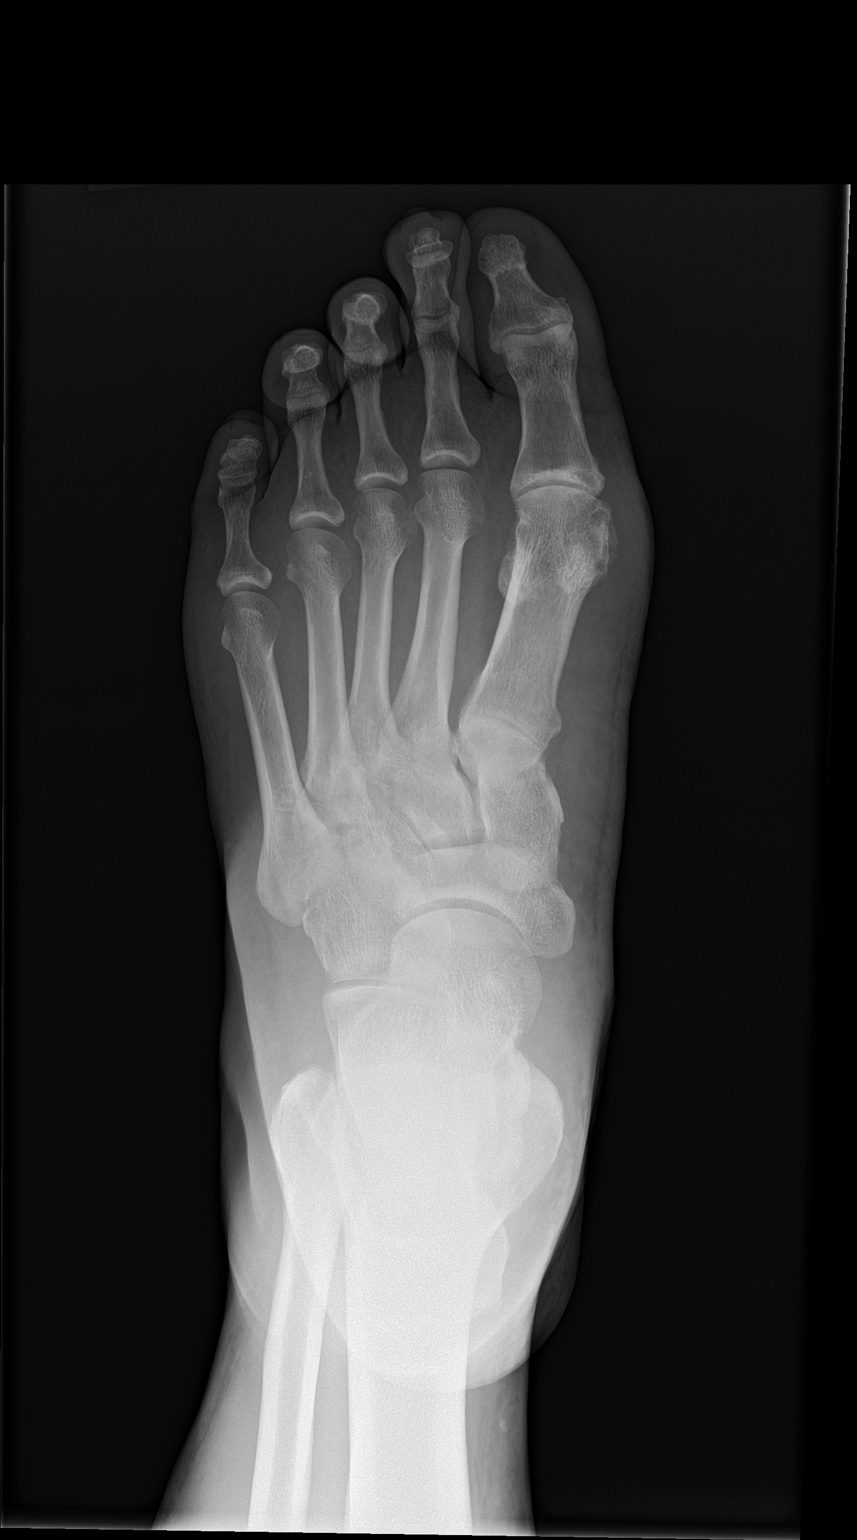

[foot obl]
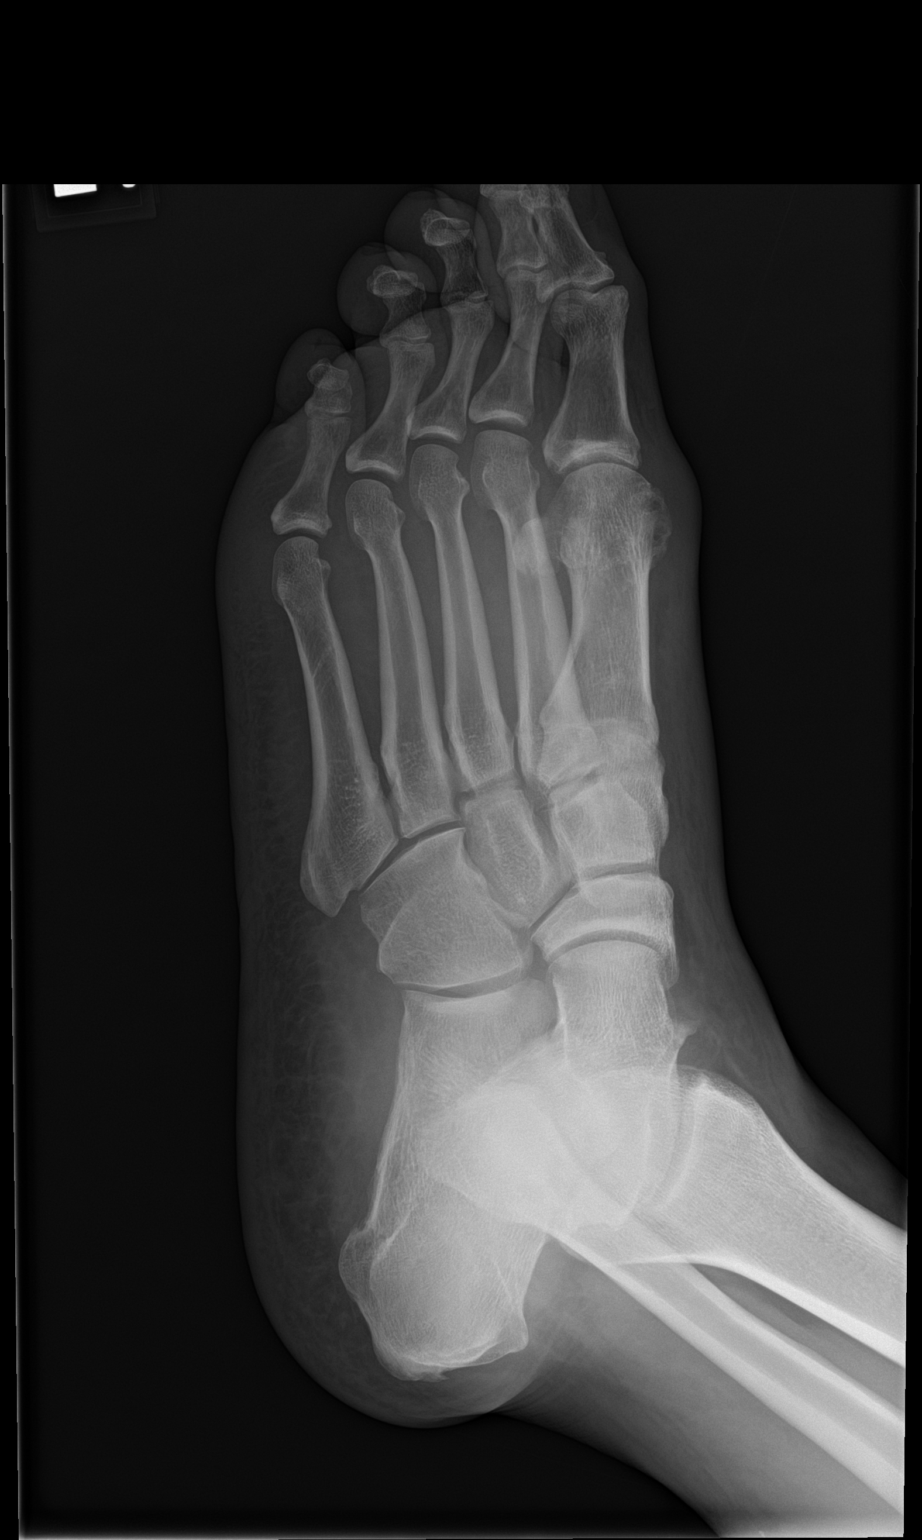

[foot lat]
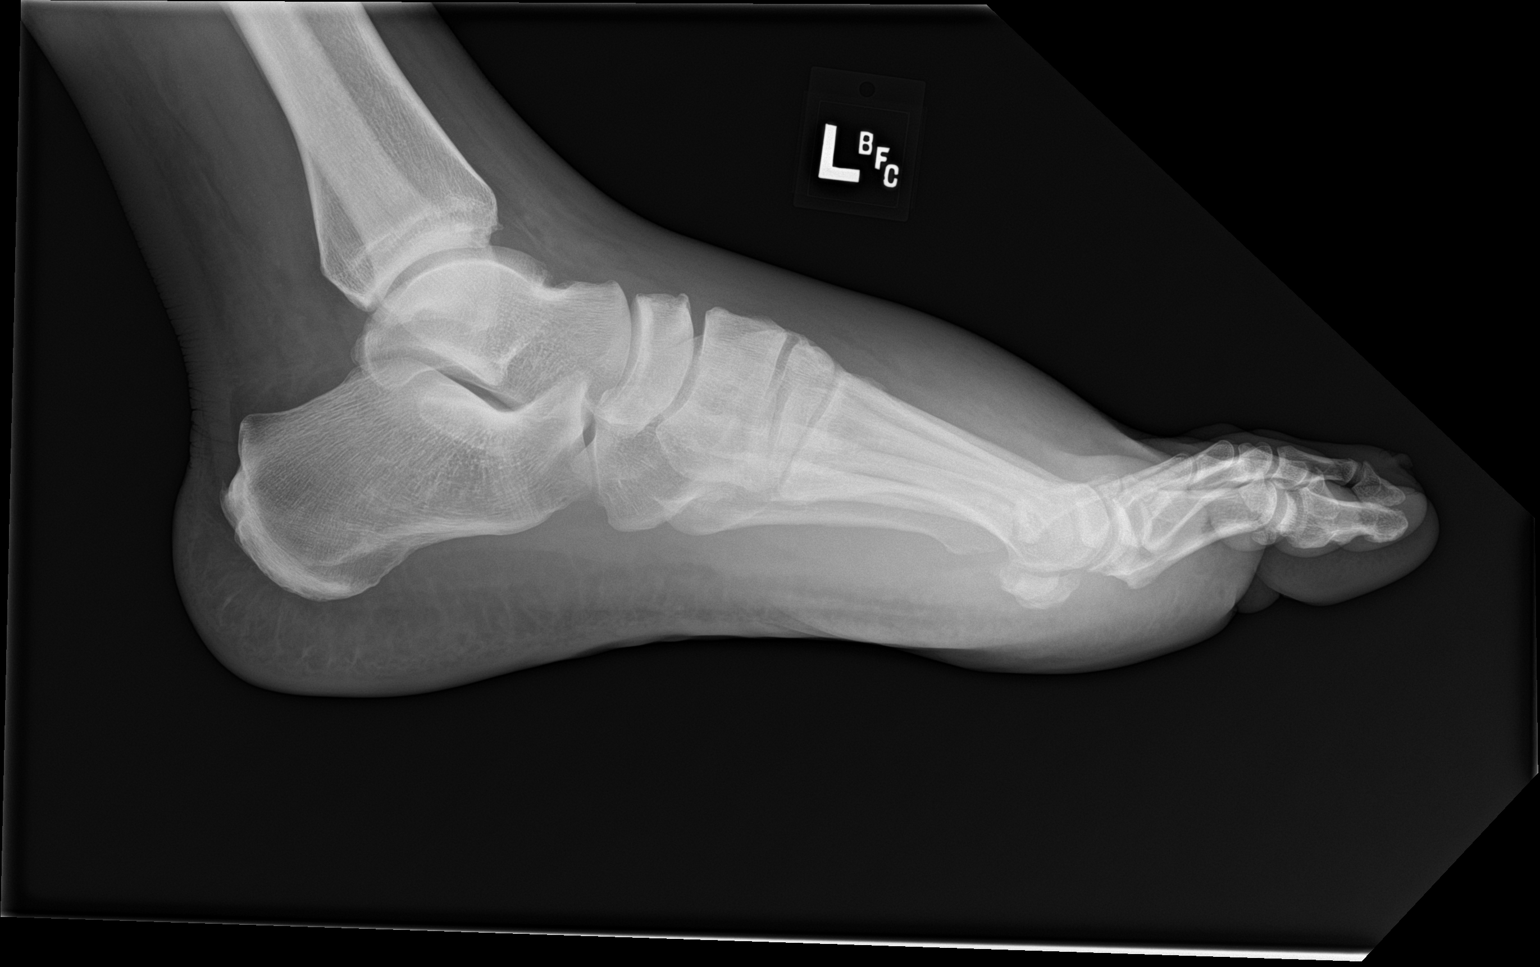

[3 of 3 positions shown; findings below may reference images not displayed]

FINDINGS: Osteoarthritis of the first metatarsal phalangeal joint. Diffuse
soft tissue swelling, most apparent about the midfoot. No acute
fracture or dislocation. No radiopaque foreign object. No soft
tissue gas.
IMPRESSION: Soft tissue swelling, without acute osseous abnormality.

## 2016-01-14 IMAGING — DX DG ANKLE COMPLETE 3+V*L*
3 series · 3 of 3 positions shown · non-contrast
Comparison: None.

CLINICAL DATA: Left ankle pain and swelling for 1 week. No known
injury.

EXAM:
LEFT ANKLE COMPLETE - 3+ VIEW

[ankle ap]
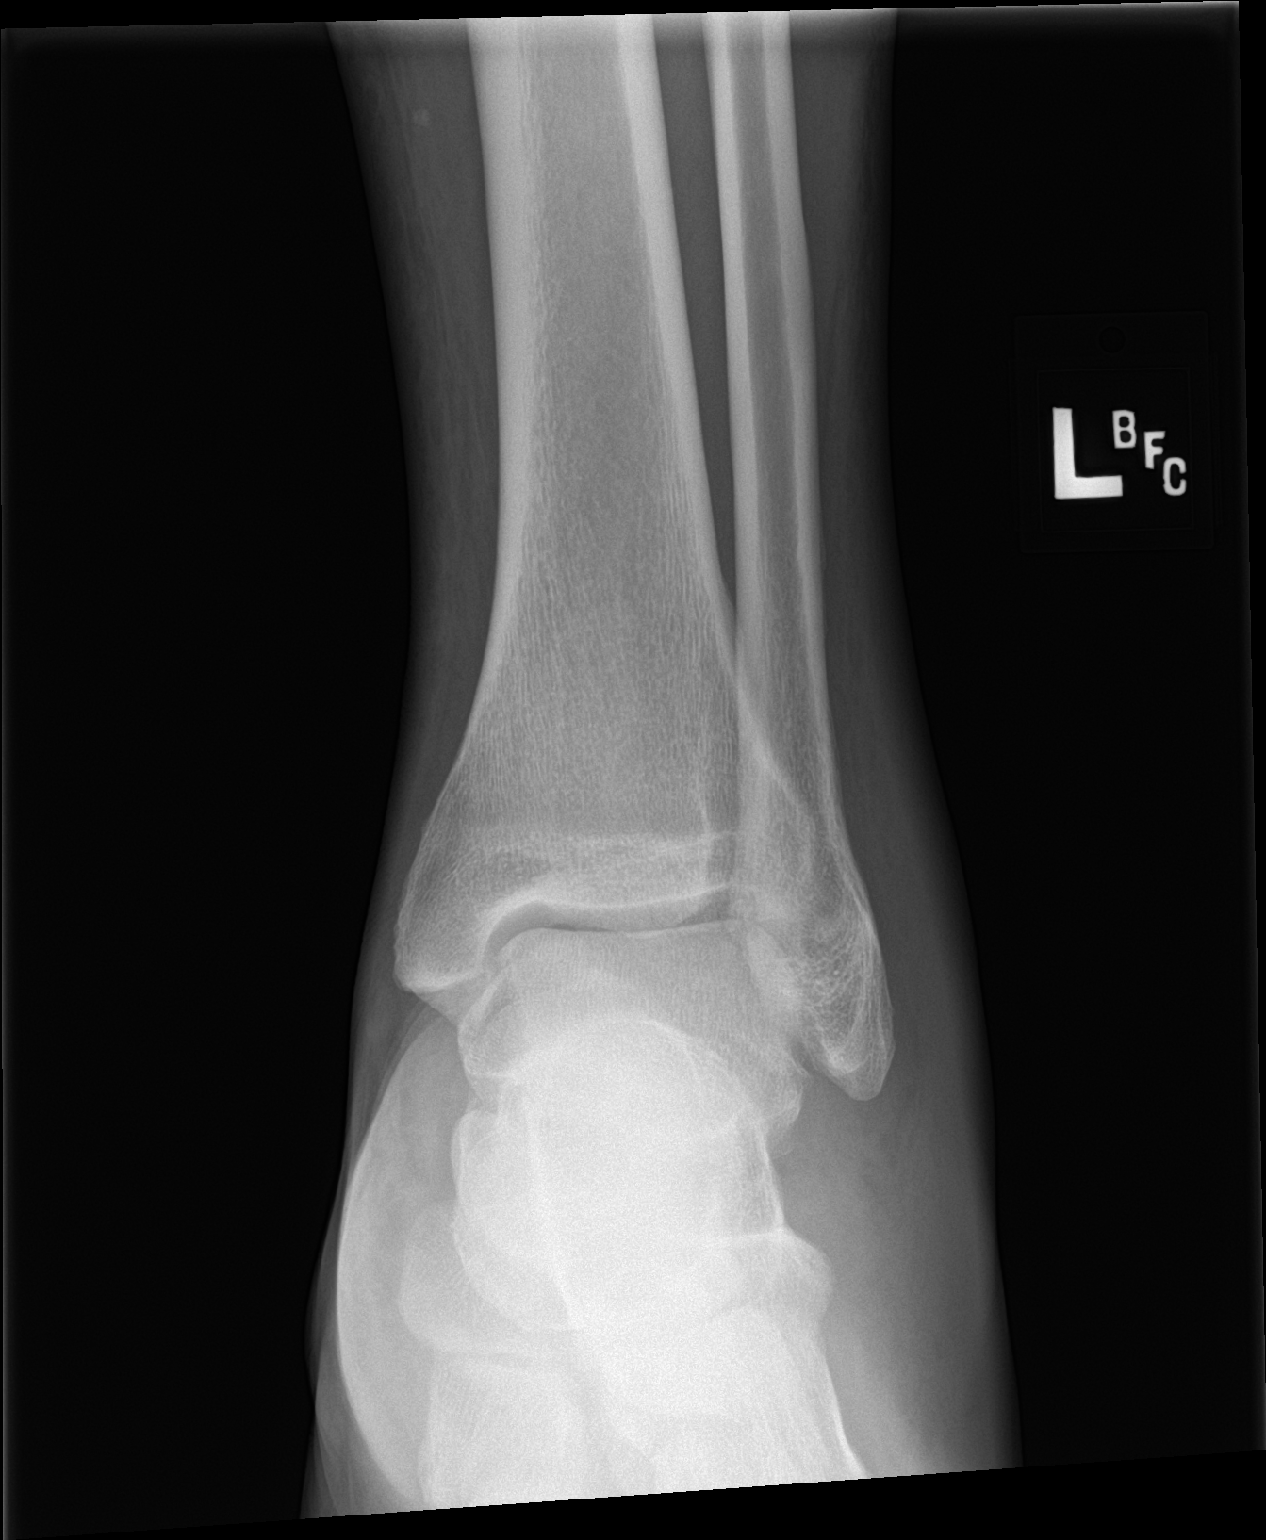

[ankle obl]
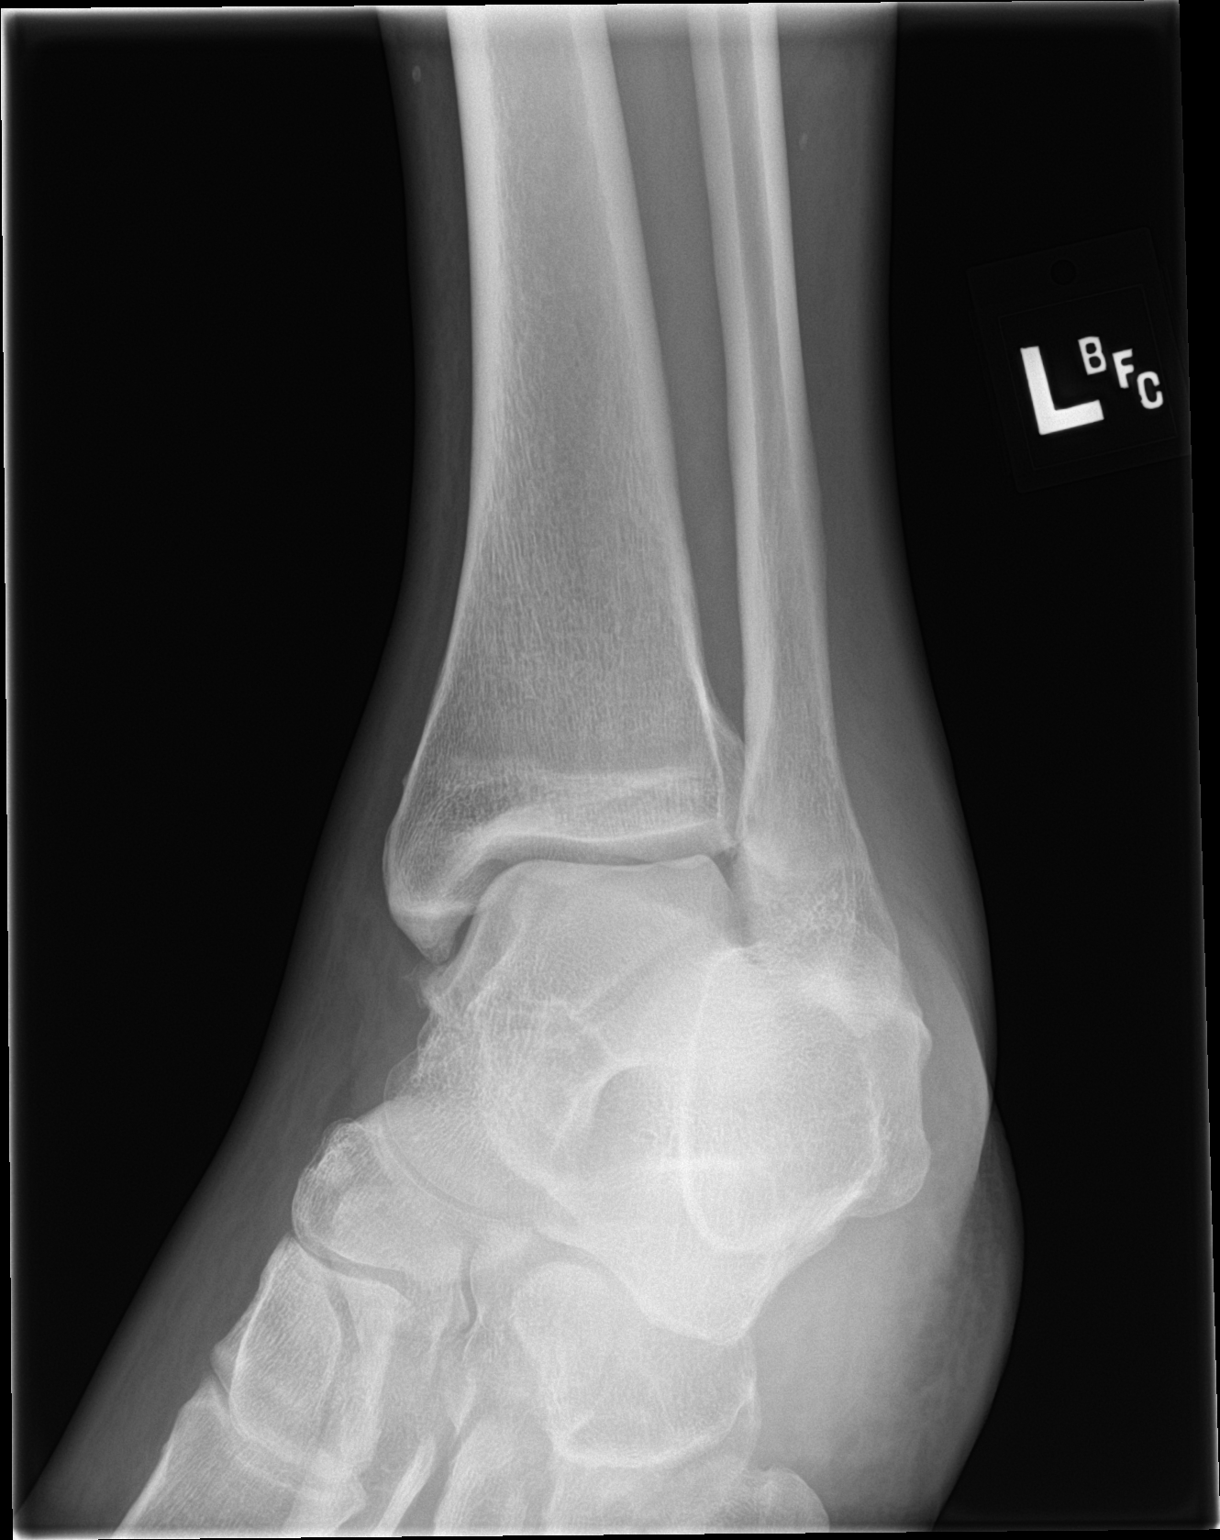

[ankle lat]
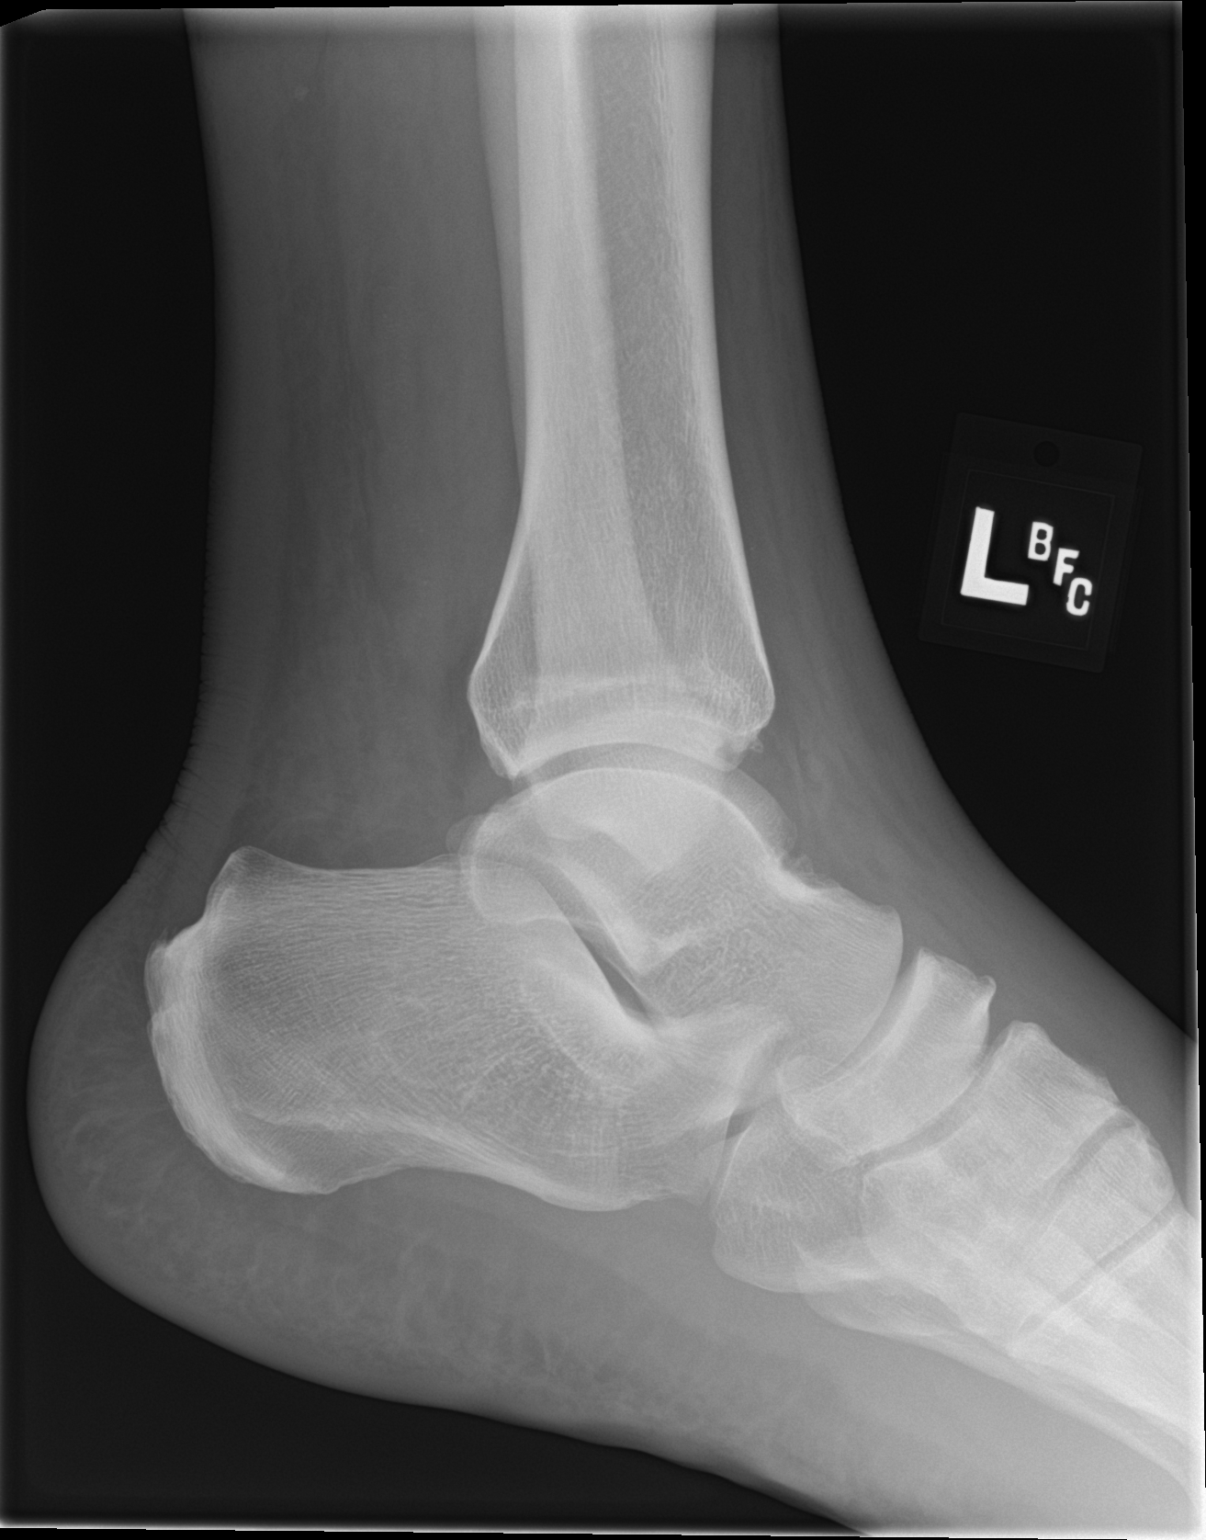

[3 of 3 positions shown; findings below may reference images not displayed]

FINDINGS: The ankle mortise is maintained. No ankle fracture or osteochondral
abnormality. No definite ankle joint effusion. The mid and hindfoot
bony structures are intact. Diffuse soft tissue swelling is noted.
IMPRESSION: No acute bony findings or significant degenerative changes.

## 2016-01-14 MED ORDER — METFORMIN HCL 500 MG PO TABS
500.0000 mg | ORAL_TABLET | Freq: Two times a day (BID) | ORAL | Status: DC
Start: 2016-01-14 — End: 2021-02-09

## 2016-01-14 MED ORDER — OXYCODONE-ACETAMINOPHEN 5-325 MG PO TABS
ORAL_TABLET | ORAL | Status: AC
  Filled 2016-01-14: qty 1

## 2016-01-14 MED ORDER — METFORMIN HCL 500 MG PO TABS: 500.0000 mg | ORAL_TABLET | Freq: Two times a day (BID) | ORAL | Status: DC

## 2016-01-14 MED ORDER — OXYCODONE-ACETAMINOPHEN 5-325 MG PO TABS
1.0000 | ORAL_TABLET | Freq: Once | ORAL | Status: AC
  Administered 2016-01-14: 1 via ORAL

## 2016-01-14 MED ORDER — PREDNISONE 20 MG PO TABS: ORAL_TABLET | ORAL | Status: DC

## 2016-01-14 NOTE — ED Notes (Signed)
Pt has moderate swelling noted to left foot. Reports swelling x 1 week, hx of same after being around animals. Denies fever/chills.

## 2016-01-14 NOTE — ED Provider Notes (Signed)
  Face-to-face evaluation   History: Here for evaluation of swelling in left foot, for 1 week, which is gradually improving over the last 2 days, spontaneously. He has had this at least 4 other times. He has never been evaluated for it. He denies recent trauma. There's been no fever, chills, nausea, vomiting, weakness or dizziness.    Physical exam: Alert, calm, cooperative. Left foot is moderately swollen, edematous, and is nontender to palpation. Swelling is primarily over the dorsal midfoot. There is no significant swelling or deformity of the left ankle. There is no erythema over the area of swelling. There is normal distal circulation and sensation in the toes of the left foot.  MDM- nonspecific recurrent left foot swelling. Swelling is nonspecific in nature. There is no clear evidence for tendinitis, suspected septic arthritis or cellulitis. This may be related to gout or pseudogout. The area is not amenable to joint aspiration, in the emergency department setting. The patient is stable for discharge with outpatient follow-up after treatment with a short course of prednisone.  Medical screening examination/treatment/procedure(s) were conducted as a shared visit with non-physician practitioner(s) and myself.  I personally evaluated the patient during the encounter  Mancel BaleElliott Amelio Brosky, MD 01/16/16 [phone]

## 2016-01-14 NOTE — Progress Notes (Signed)
VASCULAR LAB PRELIMINARY  PRELIMINARY  PRELIMINARY  PRELIMINARY  Left lower extremity venous duplex completed.    Preliminary report:  Left:  No evidence of DVT, superficial thrombosis, or Baker's cyst. Lymph nodes in groin area.  Jenetta Logesami Arlys Scatena, RVT, RDMS 01/14/2016, 3:53 PM

## 2016-01-14 NOTE — Discharge Instructions (Signed)
1. Medications: prednisone, metformin, usual home medications 2. Treatment: rest, drink plenty of fluids,  3. Follow Up: Please followup with your primary doctor in 5-7 days for discussion of your diagnoses and further evaluation after today's visit; if you do not have a primary care doctor use the resource guide provided to find one; Please return to the ER for worsening pain, fever, vomiting, continued swelling, concerning symptoms

## 2016-01-14 NOTE — ED Provider Notes (Signed)
CSN: [id number]     Arrival date & time 01/14/16  1117 History   First MD Initiated Contact with Patient 01/14/16 1418     Chief Complaint  Patient presents with  . Foot Swelling     (Consider location/radiation/quality/duration/timing/severity/associated sxs/prior Treatment) The history is provided by the patient and medical records. No language interpreter was used.     Jesse [name] is a 55 y.o. male  with a hx of NIDDM (not on medication) presents to the Emergency Department complaining of gradual, persistent, progressively worsening swelling of the left foot onset approximately 1 week ago. Patient reports he has intermittently had swelling of bilateral feet. He reports that a friend brought a new dog into the house and afterwards he noticed his foot swelling. He denies trauma, scratches or bites from the dog or injuries to the foot. He reports the swelling has decreased some and the pain has decreased a moderate amount. He reports initially the pain was in his left ankle and forefoot. He denies a history of gout. He denies penile discharge, dysuria or hematuria. He denies recent history of gonorrhea.  Associated symptoms include swelling and redness of the forefoot of the left foot and mild lateral and medial left ankle pain. He denies history of DVT, pain in his calf, recent immobilization or travel or swelling traveling proximally up his leg. No treatments prior to arrival. Nothing seems to make the symptoms better or worse.   History reviewed. No pertinent past medical history. History reviewed. No pertinent past surgical history. History reviewed. No pertinent family history. Social History  Substance Use Topics  . Smoking status: Never Smoker   . Smokeless tobacco: Never Used  . Alcohol Use: 1.2 oz/week    2 Cans of beer per week     Comment: Occasional    Review of Systems  Constitutional: Negative for fever, diaphoresis, appetite change, fatigue and unexpected weight  change.  HENT: Negative for mouth sores.   Eyes: Negative for visual disturbance.  Respiratory: Negative for cough, chest tightness, shortness of breath and wheezing.   Cardiovascular: Positive for leg swelling. Negative for chest pain.  Gastrointestinal: Negative for nausea, vomiting, abdominal pain, diarrhea and constipation.  Endocrine: Negative for polydipsia, polyphagia and polyuria.  Genitourinary: Negative for dysuria, urgency, frequency and hematuria.  Musculoskeletal: Positive for arthralgias. Negative for back pain and neck stiffness.  Skin: Negative for rash.  Allergic/Immunologic: Negative for immunocompromised state.  Neurological: Negative for syncope, light-headedness and headaches.  Hematological: Does not bruise/bleed easily.  Psychiatric/Behavioral: Negative for sleep disturbance. The patient is not nervous/anxious.       Allergies  Review of patient's allergies indicates no known allergies.  Home Medications   Prior to Admission medications   Medication Sig Start Date End Date Taking? Authorizing Provider  metFORMIN (GLUCOPHAGE) 500 MG tablet Take 1 tablet (500 mg total) by mouth 2 (two) times daily with a meal. 01/14/16   Christain Mcraney, PA-C  predniSONE (DELTASONE) 20 MG tablet 3 tabs po daily x 3 days, then 2 tabs x 3 days, then 1.5 tabs x 3 days, then 1 tab x 3 days, then 0.5 tabs x 3 days 01/14/16   Dahlia Client Wende Longstreth, PA-C   BP 134/86 mmHg  Pulse 74  Temp(Src) 97.7 F (36.5 C) (Axillary)  Resp 25  SpO2 98% Physical Exam  Constitutional: He appears well-developed and well-nourished. No distress.  Awake, alert, nontoxic appearance  HENT:  Head: Normocephalic and atraumatic.  Mouth/Throat: Oropharynx is clear and moist. No  oropharyngeal exudate.  Eyes: Conjunctivae are normal. No scleral icterus.  Neck: Normal range of motion. Neck supple.  Cardiovascular: Normal rate, regular rhythm, normal heart sounds and intact distal pulses.   No murmur  heard. Pulmonary/Chest: Effort normal and breath sounds normal. No respiratory distress. He has no wheezes.  Equal chest expansion  Abdominal: Soft. Bowel sounds are normal. He exhibits no mass. There is no tenderness. There is no rebound and no guarding.  Musculoskeletal: Normal range of motion. He exhibits edema.  Nonpitting edema of the left foot and ankle with mild erythema No induration, significant tenderness to palpation or increased warmth  Neurological: He is alert.  Speech is clear and goal oriented Moves extremities without ataxia  Skin: Skin is warm and dry. He is not diaphoretic.  Psychiatric: He has a normal mood and affect.  Nursing note and vitals reviewed.   ED Course  Procedures (including critical care time) Labs Review Labs Reviewed  COMPREHENSIVE METABOLIC PANEL - Abnormal; Notable for the following:    Chloride 100 (*)    Glucose, Bld 222 (*)    All other components within normal limits  SEDIMENTATION RATE - Abnormal; Notable for the following:    Sed Rate 28 (*)    All other components within normal limits  CBC WITH DIFFERENTIAL/PLATELET  URIC ACID  BRAIN NATRIURETIC PEPTIDE    Imaging Review Dg Ankle Complete Left  01/14/2016  CLINICAL DATA:  Left ankle pain and swelling for 1 week. No known injury. EXAM: LEFT ANKLE COMPLETE - 3+ VIEW COMPARISON:  None. FINDINGS: The ankle mortise is maintained. No ankle fracture or osteochondral abnormality. No definite ankle joint effusion. The mid and hindfoot bony structures are intact. Diffuse soft tissue swelling is noted. IMPRESSION: No acute bony findings or significant degenerative changes. Electronically Signed   By: Rudie MeyerP.  Gallerani M.D.   On: 01/14/2016 16:37   Dg Foot Complete Left  01/14/2016  CLINICAL DATA:  Left foot pain and edema for 1 week without trauma. EXAM: LEFT FOOT - COMPLETE 3+ VIEW COMPARISON:  Ankle films, dictated separately. FINDINGS: Osteoarthritis of the first metatarsal phalangeal joint. Diffuse  soft tissue swelling, most apparent about the midfoot. No acute fracture or dislocation. No radiopaque foreign object. No soft tissue gas. IMPRESSION: Soft tissue swelling, without acute osseous abnormality. Electronically Signed   By: Jeronimo GreavesKyle  Talbot M.D.   On: 01/14/2016 16:36   I have personally reviewed and evaluated these images and lab results as part of my medical decision-making.   EKG Interpretation   Date/Time:  Sunday January 14 2016 15:26:45 EST Ventricular Rate:  76 PR Interval:  157 QRS Duration: 91 QT Interval:  381 QTC Calculation: 428 R Axis:   50 Text Interpretation:  Sinus rhythm since last tracing no significant  change Confirmed by WENTZ  MD, ELLIOTT ([id number]) on 01/14/2016 3:50:47 PM    VASCULAR LAB:        Preliminary report: Left: No evidence of DVT, superficial thrombosis, or Baker's cyst.  MDM   Final diagnoses:  Foot swelling  Elevated sed rate  Uncontrolled type 2 diabetes mellitus without complication, without long-term current use of insulin (HCC)   Jesse [name] presents with nonspecific recurrent foot swelling.  Area is not warm or indurated to suggest cellulitis. Almost full range of motion without significant pain, doubt tendinitis.  No indication of septic arthritis.  Venous duplexes without evidence of DVT.  Plain films are without acute abnormality to suggest fracture or stress fracture.  Uric acid  is normal however patient swelling may be secondary to gout or pseudogout. Slightly elevated sedimentation rate; No evidence of sepsis or fever.  No indication for joint aspiration emergently.  Patient's glucose is elevated to 222. He reports that he was prescribed medication for his diabetes but felt that because he did not require insulin, he did not need take the medication.  He does not have a primary care physician. We'll begin metformin and patient given referral to wellness Center.  He appears well. He ambulates without difficulty but with assistance of a  cane.  No evidence of sepsis. Vital signs are stable. We'll discharged all with steroids, instructions to elevate and ice.  The patient was discussed with and seen by Dr. Effie Shy who agrees with the treatment plan.    Dahlia Client Aleiyah Halpin, PA-C 01/14/16 1756  Mancel Bale, MD 01/16/16 [phone]

## 2016-01-14 NOTE — ED Notes (Signed)
Pt ambulates independently and with steady gait at time of discharge. Discharge instructions and follow up information reviewed with patient. No other questions or concerns voiced at this time. RX x 2 given. 

## 2021-01-17 ENCOUNTER — Other Ambulatory Visit: Payer: Self-pay

## 2021-01-17 ENCOUNTER — Emergency Department (HOSPITAL_COMMUNITY): Payer: Self-pay

## 2021-01-17 ENCOUNTER — Emergency Department (HOSPITAL_COMMUNITY)
Admission: EM | Admit: 2021-01-17 | Discharge: 2021-01-17 | Discharge status: Against Medical Advice | Payer: Self-pay | Attending: Emergency Medicine | Admitting: Emergency Medicine

## 2021-01-17 ENCOUNTER — Encounter (HOSPITAL_COMMUNITY): Payer: Self-pay | Admitting: Emergency Medicine

## 2021-01-17 DIAGNOSIS — Z20822 Contact with and (suspected) exposure to covid-19: Secondary | ICD-10-CM | POA: Insufficient documentation

## 2021-01-17 DIAGNOSIS — Z7984 Long term (current) use of oral hypoglycemic drugs: Secondary | ICD-10-CM | POA: Insufficient documentation

## 2021-01-17 DIAGNOSIS — R609 Edema, unspecified: Secondary | ICD-10-CM

## 2021-01-17 DIAGNOSIS — R600 Localized edema: Secondary | ICD-10-CM | POA: Insufficient documentation

## 2021-01-17 DIAGNOSIS — E119 Type 2 diabetes mellitus without complications: Secondary | ICD-10-CM | POA: Insufficient documentation

## 2021-01-17 DIAGNOSIS — L03031 Cellulitis of right toe: Secondary | ICD-10-CM

## 2021-01-17 LAB — CBC WITH DIFFERENTIAL/PLATELET
Abs Immature Granulocytes: 0.08 10*3/uL — ABNORMAL HIGH (ref 0.00–0.07)
Basophils Absolute: 0 10*3/uL (ref 0.0–0.1)
Basophils Relative: 0 %
Eosinophils Absolute: 0.2 10*3/uL (ref 0.0–0.5)
Eosinophils Relative: 1 %
HCT: 41.3 % (ref 39.0–52.0)
Hemoglobin: 14 g/dL (ref 13.0–17.0)
Immature Granulocytes: 1 %
Lymphocytes Relative: 10 %
Lymphs Abs: 1.2 10*3/uL (ref 0.7–4.0)
MCH: 32.6 pg (ref 26.0–34.0)
MCHC: 33.9 g/dL (ref 30.0–36.0)
MCV: 96.3 fL (ref 80.0–100.0)
Monocytes Absolute: 1.3 10*3/uL — ABNORMAL HIGH (ref 0.1–1.0)
Monocytes Relative: 11 %
Neutro Abs: 9.1 10*3/uL — ABNORMAL HIGH (ref 1.7–7.7)
Neutrophils Relative %: 77 %
Platelets: 258 10*3/uL (ref 150–400)
RBC: 4.29 MIL/uL (ref 4.22–5.81)
RDW: 12.4 % (ref 11.5–15.5)
WBC: 11.9 10*3/uL — ABNORMAL HIGH (ref 4.0–10.5)
nRBC: 0 % (ref 0.0–0.2)

## 2021-01-17 LAB — RESP PANEL BY RT-PCR (FLU A&B, COVID) ARPGX2
Influenza A by PCR: NEGATIVE
Influenza B by PCR: NEGATIVE
SARS Coronavirus 2 by RT PCR: NEGATIVE

## 2021-01-17 LAB — APTT: aPTT: 31 seconds (ref 24–36)

## 2021-01-17 LAB — COMPREHENSIVE METABOLIC PANEL
ALT: 26 U/L (ref 0–44)
AST: 24 U/L (ref 15–41)
Albumin: 3.6 g/dL (ref 3.5–5.0)
Alkaline Phosphatase: 79 U/L (ref 38–126)
Anion gap: 11 (ref 5–15)
BUN: 11 mg/dL (ref 6–20)
CO2: 24 mmol/L (ref 22–32)
Calcium: 9.5 mg/dL (ref 8.9–10.3)
Chloride: 100 mmol/L (ref 98–111)
Creatinine, Ser: 1.24 mg/dL (ref 0.61–1.24)
GFR, Estimated: >60 mL/min (ref >60)
Glucose, Bld: 217 mg/dL — ABNORMAL HIGH (ref 70–99)
Potassium: 4.1 mmol/L (ref 3.5–5.1)
Sodium: 135 mmol/L (ref 135–145)
Total Bilirubin: 1.1 mg/dL (ref 0.3–1.2)
Total Protein: 7.5 g/dL (ref 6.5–8.1)

## 2021-01-17 LAB — CBG MONITORING, ED: Glucose-Capillary: 207 mg/dL — ABNORMAL HIGH (ref 70–99)

## 2021-01-17 LAB — BRAIN NATRIURETIC PEPTIDE: B Natriuretic Peptide: 12.7 pg/mL (ref 0.0–100.0)

## 2021-01-17 LAB — LACTIC ACID, PLASMA: Lactic Acid, Venous: 1.9 mmol/L (ref 0.5–1.9)

## 2021-01-17 LAB — PROTIME-INR
INR: 1.1 (ref 0.8–1.2)
Prothrombin Time: 13.8 seconds (ref 11.4–15.2)

## 2021-01-17 IMAGING — DX DG TOE GREAT 2+V*R*
3 series · 3 of 3 positions shown · non-contrast
Comparison: None.

CLINICAL DATA: Bilateral lower extremity edema with questionable
sepsis.

EXAM:
RIGHT GREAT TOE

[toe ap]
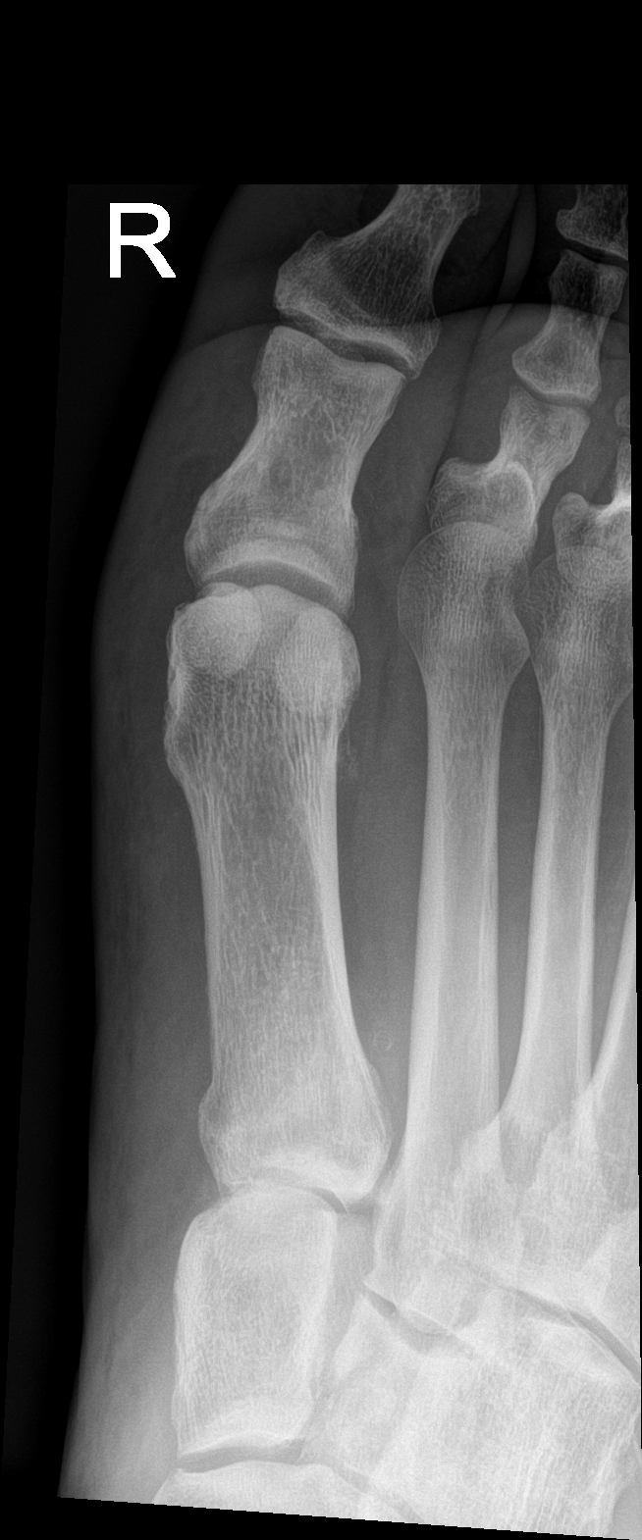

[toe obl]
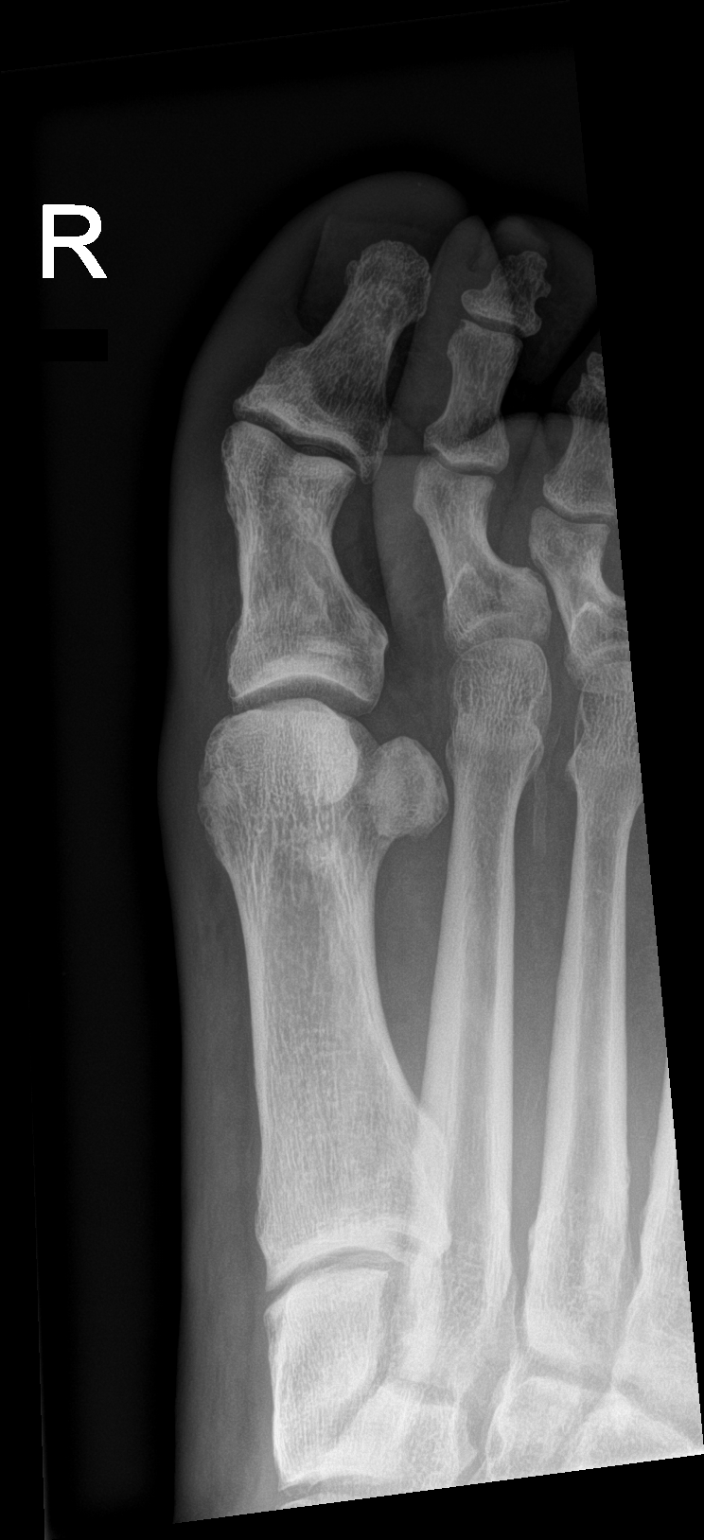

[toe lat]
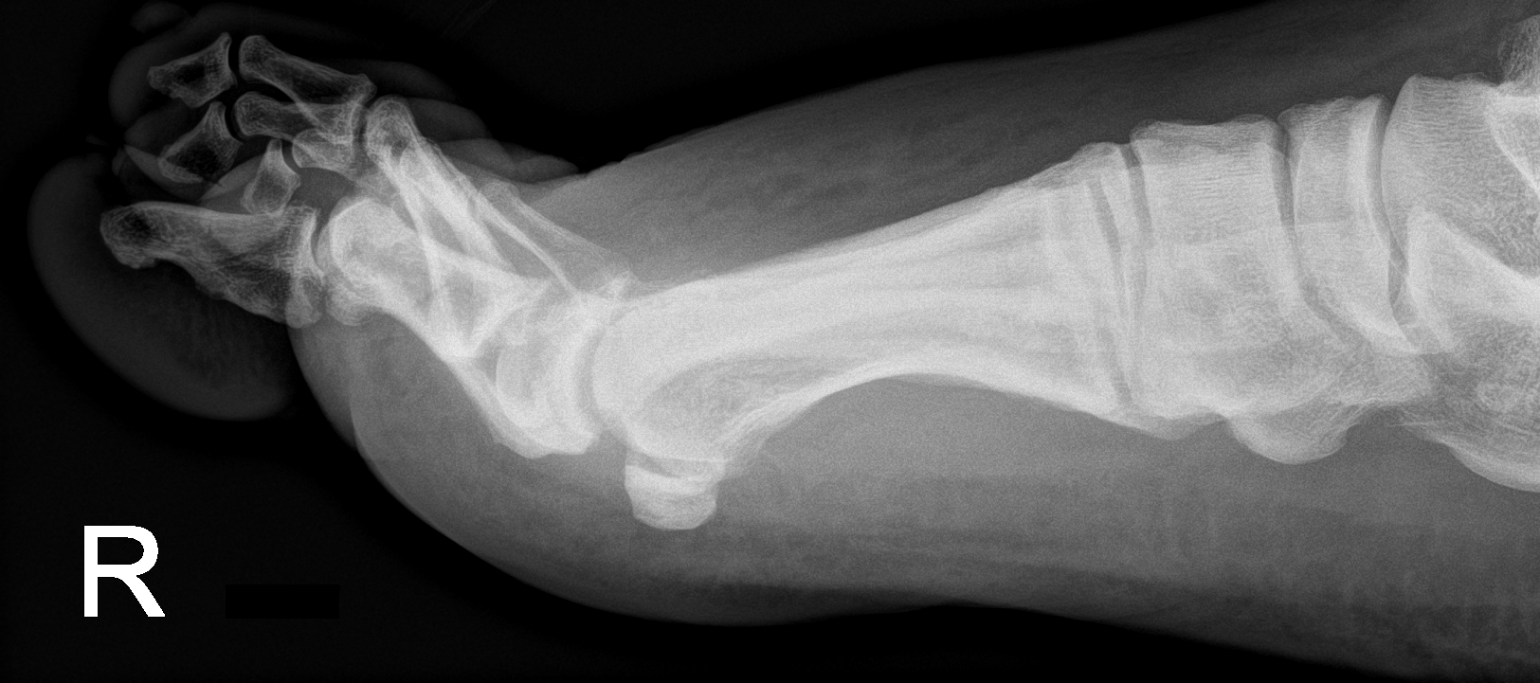

[3 of 3 positions shown; findings below may reference images not displayed]

FINDINGS: There is no evidence of an acute fracture or dislocation. There is
no evidence of arthropathy or other focal bone abnormality. Moderate
severity diffuse soft tissue swelling is noted.
IMPRESSION: Moderate severity diffuse soft tissue swelling without evidence of
acute fracture.

## 2021-01-17 IMAGING — DX DG CHEST 1V PORT
1 series · 1 of 1 positions shown · non-contrast
Comparison: None.

CLINICAL DATA: Questionable sepsis.

EXAM:
PORTABLE CHEST 1 VIEW

[chest ap]
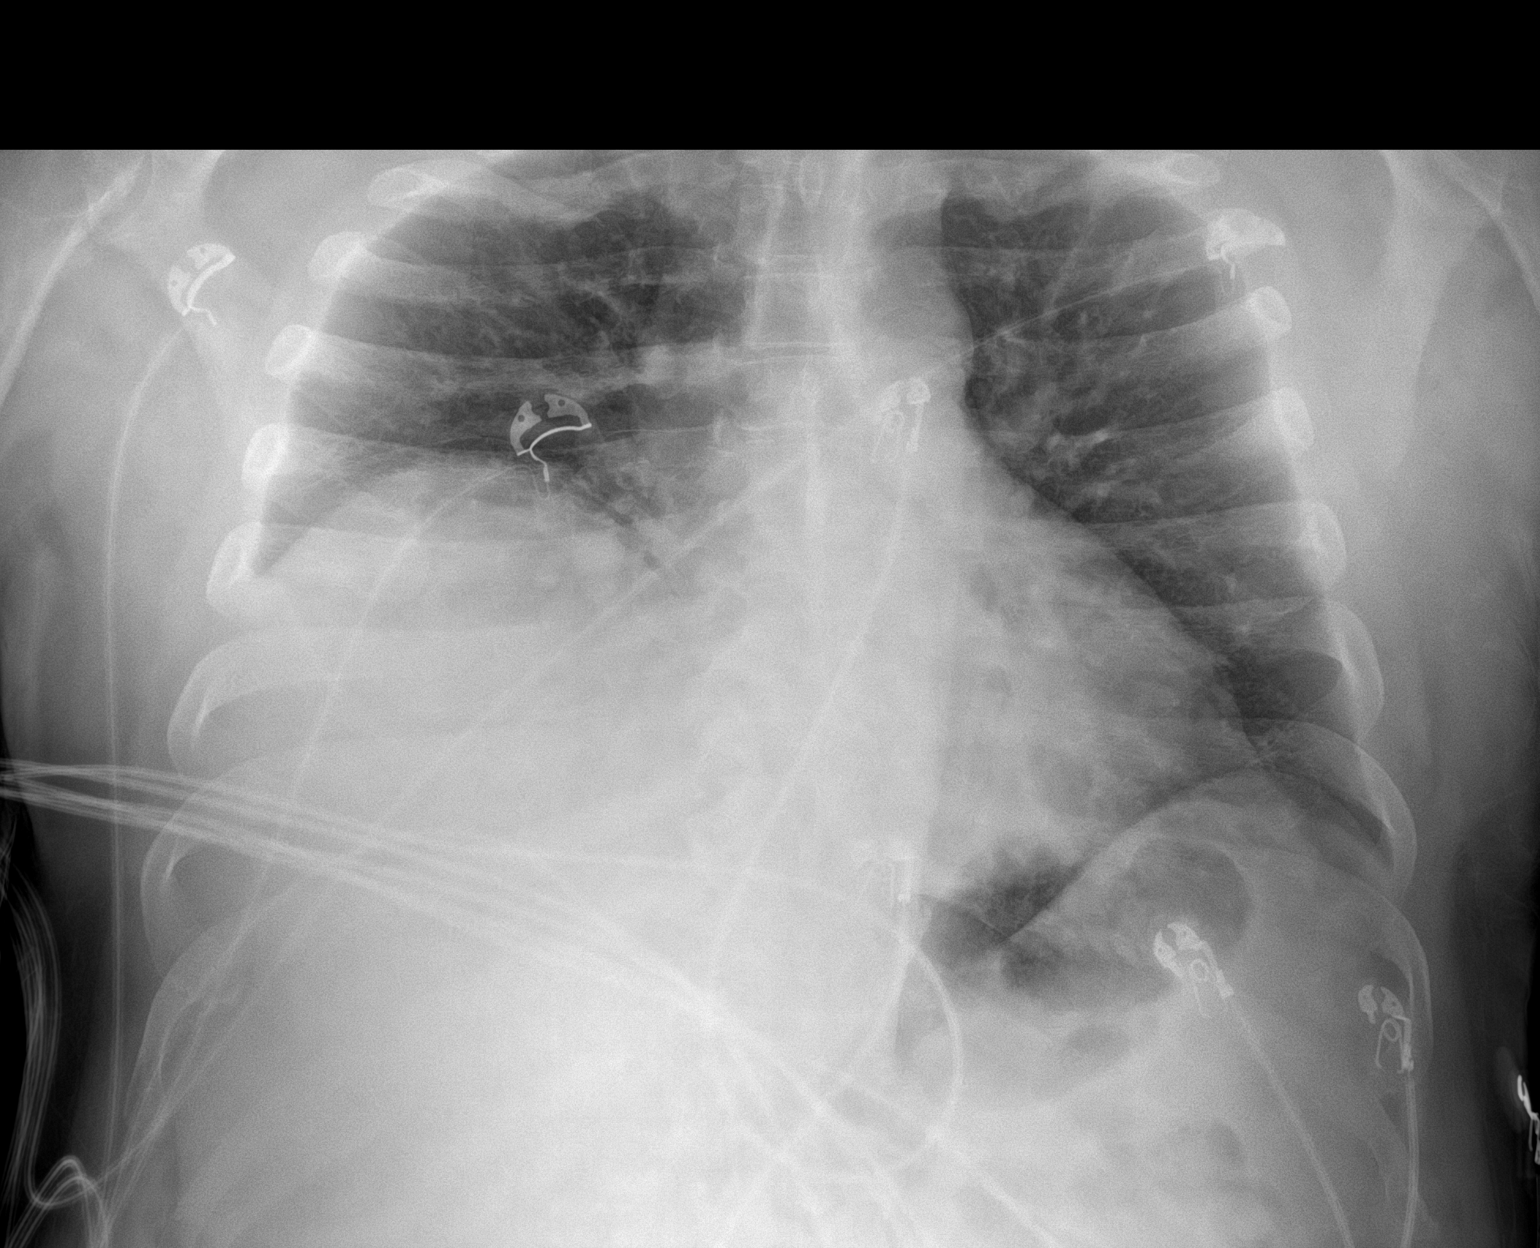

[1 of 1 positions shown; findings below may reference images not displayed]

FINDINGS: Mild, chronic appearing increased lung markings are seen. There is
marked severity elevation of the right hemidiaphragm with subsequent
right-sided volume loss. There is no evidence of acute infiltrate,
pleural effusion or pneumothorax. The heart size and mediastinal
contours are within normal limits. Degenerative changes seen within
the thoracic spine.
IMPRESSION: 1. Elevated right hemidiaphragm and subsequent right-sided volume
loss without evidence of active cardiopulmonary disease.

## 2021-01-17 NOTE — ED Notes (Signed)
Pt has decided he would like to leave, and come back tomorrow. EDP aware, pt understands that he is leaving AMA. Unable to sign form due to multiple pinpads not working.

## 2021-01-17 NOTE — ED Provider Notes (Signed)
[facility] EMERGENCY DEPARTMENT Provider Note   CSN: [id number] Arrival date & time: 01/17/21  1221     History Chief Complaint  Patient presents with   Leg Swelling    Jesse [name] is a 60 y.o. male presents emergency department with chief complaint of bilateral leg swelling.  Patient states that his legs have been very swollen for about 1 week.  About 2 weeks ago he noticed that his legs are swollen but they got better.  He states that it is never been this swollen before.  He denies any orthopnea, PND or shortness of breath.  He denies chest pain.  States that he is not sure if he is a diabetic and does not follow with any primary care doctors regularly.  Review of EMR shows that he has a history of diabetes.  HPI     History reviewed. No pertinent past medical history.  Patient Active Problem List   Diagnosis Date Noted   Hypokalemia 01/28/2014   DM (diabetes mellitus), type 2, uncontrolled (HCC) 01/27/2014   AKI (acute kidney injury) (HCC) 01/27/2014   Leukocytosis, unspecified 01/27/2014   Facial cellulitis 01/26/2014   Dental abscess 01/26/2014   Infected dental carries 01/26/2014   Cellulitis and abscess 01/26/2014    History reviewed. No pertinent surgical history.     No family history on file.  Social History   Tobacco Use   Smoking status: Never Smoker   Smokeless tobacco: Never Used  Substance Use Topics   Alcohol use: Yes    Alcohol/week: 2.0 standard drinks    Types: 2 Cans of beer per week    Comment: Occasional   Drug use: No    Home Medications Prior to Admission medications   Medication Sig Start Date End Date Taking? Authorizing Provider  metFORMIN (GLUCOPHAGE) 500 MG tablet Take 1 tablet (500 mg total) by mouth 2 (two) times daily with a meal. 01/14/16   Muthersbaugh, Dahlia Client, PA-C  predniSONE (DELTASONE) 20 MG tablet 3 tabs po daily x 3 days, then 2 tabs x 3 days, then 1.5 tabs x 3 days, then 1 tab x 3  days, then 0.5 tabs x 3 days 01/14/16   Muthersbaugh, Dahlia Client, PA-C  glipiZIDE (GLUCOTROL) 5 MG tablet Take 1 tablet (5 mg total) by mouth daily before breakfast. Patient not taking: Reported on 01/14/2016 01/31/14 01/14/16  Christiane Ha, MD    Allergies    Patient has no known allergies.  Review of Systems   Review of Systems Ten systems reviewed and are negative for acute change, except as noted in the HPI.   Physical Exam Updated Vital Signs BP (!) 140/123 (BP Location: Left Arm)    Pulse 95    Temp 98.8 F (37.1 C)    Resp 18    Ht 5\' 11"  (1.803 m)    Wt 106.6 kg    BMI 32.78 kg/m   Physical Exam Vitals and nursing note reviewed.  Constitutional:      General: He is not in acute distress.    Appearance: He is well-developed and well-nourished. He is not diaphoretic.  HENT:     Head: Normocephalic and atraumatic.  Eyes:     General: No scleral icterus.    Conjunctiva/sclera: Conjunctivae normal.  Cardiovascular:     Rate and Rhythm: Normal rate and regular rhythm.     Heart sounds: Normal heart sounds.     Comments: BL pitting edema to the Proximal thighs Pulmonary:  Effort: Pulmonary effort is normal. No respiratory distress.     Breath sounds: Normal breath sounds.  Abdominal:     Palpations: Abdomen is soft.     Tenderness: There is no abdominal tenderness.  Musculoskeletal:     Cervical back: Normal range of motion and neck supple.     Right lower leg: 4+ Pitting Edema present.     Left lower leg: 4+ Pitting Edema present.  Skin:    General: Skin is warm and dry.  Neurological:     Mental Status: He is alert.  Psychiatric:        Behavior: Behavior normal.           ED Results / Procedures / Treatments   Labs (all labs ordered are listed, but only abnormal results are displayed) Labs Reviewed - No data to display  EKG None  Radiology No results found.  Procedures Procedures  Medications Ordered in ED Medications - No data to  display  ED Course  I have reviewed the triage vital signs and the nursing notes.  Pertinent labs & imaging results that were available during my care of the patient were reviewed by me and considered in my medical decision making (see chart for details).    MDM Rules/Calculators/A&P                          59 year old male here with bilateral lower extremity edema, obvious infection of the great toe..  Patient seems to have limited insight into his disease and is does not see doctors and was apparently unaware that he had diabetes even though he has a diagnosis and has been given medication treated in the past.  I feel strongly that the patient would need admission today given his extensive edema bilaterally, untreated hypertension, diabetes and lack of access to care.  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 I ordered and reviewed labs which show elevated white blood cell count of 11.9.  CMP with glucose of 2217.  No anion gap.  Lactic acid within normal limits, Covid test is negative.  BNP without elevation.  I ordered and reviewed a 1 view chest x-ray and a right great toe x-ray.  1 view chest shows no acute abnormalities.  Great toe without bony erosion or evidence of osteomyelitis.  EKG shows sinus rhythm at a rate of 89.  Date: 01/17/2021 Patient: Jesse [name]  Attending Provider: Dr. Melene Plan, DO  Jesse [name] or his authorized caregiver has made the decision for the patient to leave the emergency department against the advice of [facility].  He or his authorized caregiver has been informed and understands the inherent risks, including death.  He or his authorized caregiver has decided to accept the responsibility for this decision. Jesse [name] and all necessary parties have been advised that he may return  for further evaluation or treatment. His condition at time of discharge was Fair.  Jesse [name] had current vital signs as follows:  Blood pressure (!) 150/100, pulse 84, temperature 98.8 F (37.1 C), resp. rate (!) 28, height 5\' 11"  (1.803 m), weight 106.6 kg, SpO2 96 %.    01/17/2021    Final Clinical Impression(s) / ED Diagnoses Final diagnoses:  None  Rx / DC Orders ED Discharge Orders    None       Arthor Captain, PA-C 01/17/21 2330    Melene Plan, DO 01/19/21 2257

## 2021-01-17 NOTE — ED Notes (Signed)
Pt standing at the doorway, states he would like to leave, EDP aware.

## 2021-01-17 NOTE — ED Triage Notes (Signed)
Patient complains of bilateral lower extremity edema that started approximately one week ago. Denies shortness of breath, no history of heart failure. States the swelling started after someone brought some animals to live in his residence. Patient alert, oriented, and in no apparent distress at this time.

## 2021-01-20 LAB — CULTURE, BLOOD (ROUTINE X 2): Special Requests: ADEQUATE

## 2021-01-21 LAB — CULTURE, BLOOD (ROUTINE X 2): Culture: NO GROWTH

## 2021-01-22 LAB — CULTURE, BLOOD (ROUTINE X 2)
Culture: NO GROWTH
Special Requests: ADEQUATE

## 2021-02-07 ENCOUNTER — Observation Stay (HOSPITAL_COMMUNITY): Payer: Self-pay

## 2021-02-07 ENCOUNTER — Inpatient Hospital Stay (HOSPITAL_COMMUNITY)
Admission: EM | Admit: 2021-02-07 | Discharge: 2021-02-09 | DRG: 603 | Disposition: A | Discharge status: Home Health | Payer: Self-pay | Attending: Family Medicine | Admitting: Family Medicine

## 2021-02-07 ENCOUNTER — Emergency Department (HOSPITAL_COMMUNITY): Payer: Self-pay

## 2021-02-07 ENCOUNTER — Other Ambulatory Visit: Payer: Self-pay

## 2021-02-07 ENCOUNTER — Encounter (HOSPITAL_COMMUNITY): Payer: Self-pay

## 2021-02-07 DIAGNOSIS — L0390 Cellulitis, unspecified: Secondary | ICD-10-CM

## 2021-02-07 DIAGNOSIS — Z9114 Patient's other noncompliance with medication regimen: Secondary | ICD-10-CM

## 2021-02-07 DIAGNOSIS — E1165 Type 2 diabetes mellitus with hyperglycemia: Secondary | ICD-10-CM | POA: Diagnosis present

## 2021-02-07 DIAGNOSIS — R600 Localized edema: Secondary | ICD-10-CM | POA: Diagnosis present

## 2021-02-07 DIAGNOSIS — Z79899 Other long term (current) drug therapy: Secondary | ICD-10-CM

## 2021-02-07 DIAGNOSIS — Z20822 Contact with and (suspected) exposure to covid-19: Secondary | ICD-10-CM | POA: Diagnosis present

## 2021-02-07 DIAGNOSIS — K760 Fatty (change of) liver, not elsewhere classified: Secondary | ICD-10-CM | POA: Diagnosis present

## 2021-02-07 DIAGNOSIS — R609 Edema, unspecified: Secondary | ICD-10-CM

## 2021-02-07 DIAGNOSIS — L97511 Non-pressure chronic ulcer of other part of right foot limited to breakdown of skin: Secondary | ICD-10-CM | POA: Diagnosis present

## 2021-02-07 DIAGNOSIS — E785 Hyperlipidemia, unspecified: Secondary | ICD-10-CM | POA: Diagnosis present

## 2021-02-07 DIAGNOSIS — R601 Generalized edema: Secondary | ICD-10-CM | POA: Insufficient documentation

## 2021-02-07 DIAGNOSIS — Z7984 Long term (current) use of oral hypoglycemic drugs: Secondary | ICD-10-CM

## 2021-02-07 DIAGNOSIS — R3129 Other microscopic hematuria: Secondary | ICD-10-CM | POA: Diagnosis present

## 2021-02-07 DIAGNOSIS — L03115 Cellulitis of right lower limb: Principal | ICD-10-CM | POA: Diagnosis present

## 2021-02-07 DIAGNOSIS — F1010 Alcohol abuse, uncomplicated: Secondary | ICD-10-CM | POA: Diagnosis present

## 2021-02-07 DIAGNOSIS — E11621 Type 2 diabetes mellitus with foot ulcer: Secondary | ICD-10-CM | POA: Diagnosis present

## 2021-02-07 DIAGNOSIS — I10 Essential (primary) hypertension: Secondary | ICD-10-CM | POA: Diagnosis present

## 2021-02-07 HISTORY — DX: Localized edema: R60.0

## 2021-02-07 LAB — COMPREHENSIVE METABOLIC PANEL
ALT: 24 U/L (ref 0–44)
AST: 24 U/L (ref 15–41)
Albumin: 3.5 g/dL (ref 3.5–5.0)
Alkaline Phosphatase: 75 U/L (ref 38–126)
Anion gap: 8 (ref 5–15)
BUN: 13 mg/dL (ref 6–20)
CO2: 25 mmol/L (ref 22–32)
Calcium: 9.3 mg/dL (ref 8.9–10.3)
Chloride: 102 mmol/L (ref 98–111)
Creatinine, Ser: 1.14 mg/dL (ref 0.61–1.24)
GFR, Estimated: >60 mL/min (ref >60)
Glucose, Bld: 251 mg/dL — ABNORMAL HIGH (ref 70–99)
Potassium: 4.1 mmol/L (ref 3.5–5.1)
Sodium: 135 mmol/L (ref 135–145)
Total Bilirubin: 0.7 mg/dL (ref 0.3–1.2)
Total Protein: 7.6 g/dL (ref 6.5–8.1)

## 2021-02-07 LAB — HEMOGLOBIN A1C
Hgb A1c MFr Bld: 10.6 % — ABNORMAL HIGH (ref 4.8–5.6)
Hgb A1c MFr Bld: 10.5 % — ABNORMAL HIGH (ref 4.8–5.6)
Mean Plasma Glucose: 257.52 mg/dL
Mean Plasma Glucose: 254.65 mg/dL

## 2021-02-07 LAB — URINALYSIS, ROUTINE W REFLEX MICROSCOPIC
Bacteria, UA: NONE SEEN
Bilirubin Urine: NEGATIVE
Glucose, UA: 150 mg/dL — AB
Ketones, ur: NEGATIVE mg/dL
Leukocytes,Ua: NEGATIVE
Nitrite: NEGATIVE
Protein, ur: NEGATIVE mg/dL
Specific Gravity, Urine: 1.014 (ref 1.005–1.030)
pH: 5 (ref 5.0–8.0)

## 2021-02-07 LAB — GLUCOSE, CAPILLARY: Glucose-Capillary: 186 mg/dL — ABNORMAL HIGH (ref 70–99)

## 2021-02-07 LAB — CBC
HCT: 39.3 % (ref 39.0–52.0)
Hemoglobin: 13.2 g/dL (ref 13.0–17.0)
MCH: 32.3 pg (ref 26.0–34.0)
MCHC: 33.6 g/dL (ref 30.0–36.0)
MCV: 96.1 fL (ref 80.0–100.0)
Platelets: 245 10*3/uL (ref 150–400)
RBC: 4.09 MIL/uL — ABNORMAL LOW (ref 4.22–5.81)
RDW: 12.4 % (ref 11.5–15.5)
WBC: 8 10*3/uL (ref 4.0–10.5)
nRBC: 0 % (ref 0.0–0.2)

## 2021-02-07 LAB — LACTIC ACID, PLASMA
Lactic Acid, Venous: 1.7 mmol/L (ref 0.5–1.9)
Lactic Acid, Venous: 1.4 mmol/L (ref 0.5–1.9)

## 2021-02-07 LAB — BRAIN NATRIURETIC PEPTIDE: B Natriuretic Peptide: 8.9 pg/mL (ref 0.0–100.0)

## 2021-02-07 LAB — RESP PANEL BY RT-PCR (FLU A&B, COVID) ARPGX2
Influenza A by PCR: NEGATIVE
Influenza B by PCR: NEGATIVE
SARS Coronavirus 2 by RT PCR: NEGATIVE

## 2021-02-07 IMAGING — US US ABDOMEN LIMITED RUQ/ASCITES
1 series · 14 of 25 positions shown · non-contrast
Comparison: None.

CLINICAL DATA: History of alcohol use

EXAM:
ULTRASOUND ABDOMEN LIMITED RIGHT UPPER QUADRANT

[Series 1: us abdomen limited ruq (liver/gb) · 14 of 49 slices shown]
[im 1/49]
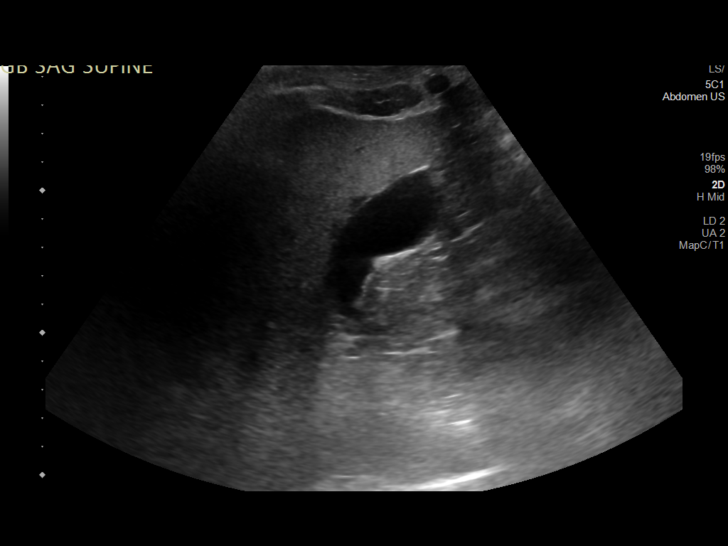
[im 5/49]
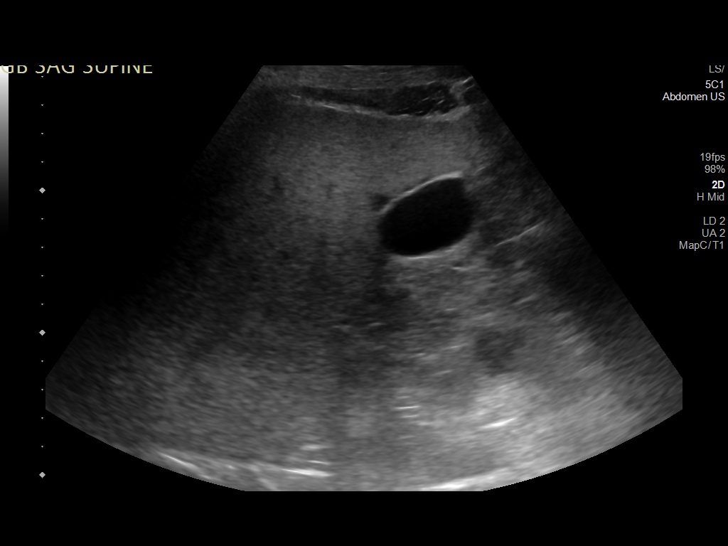
[im 9/49]
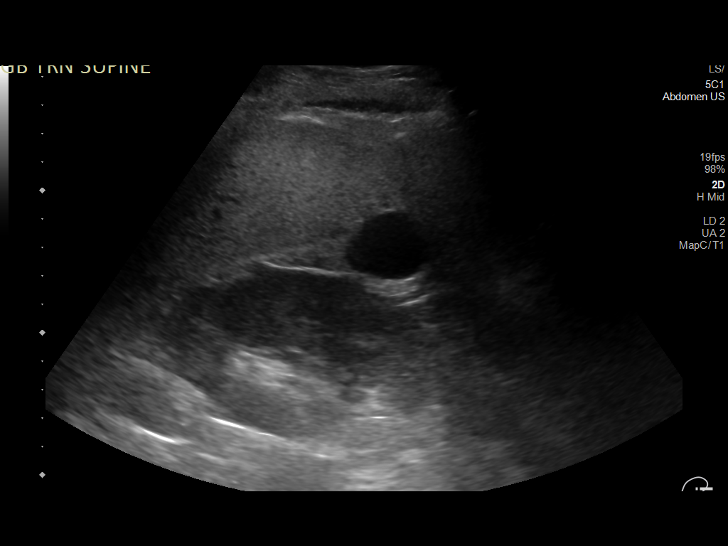
[im 13/49]
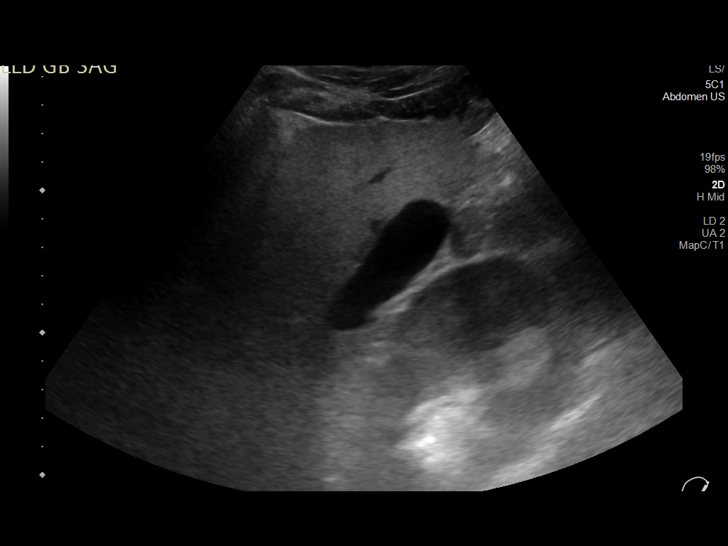
[im 17/49]
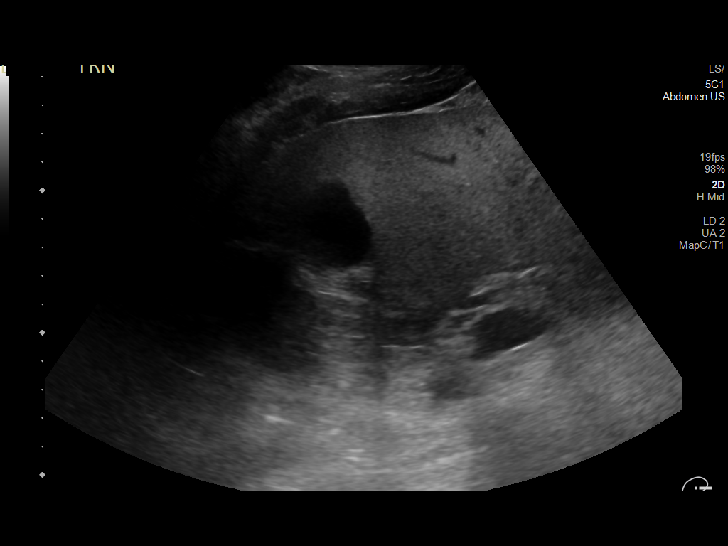
[im 19/49]
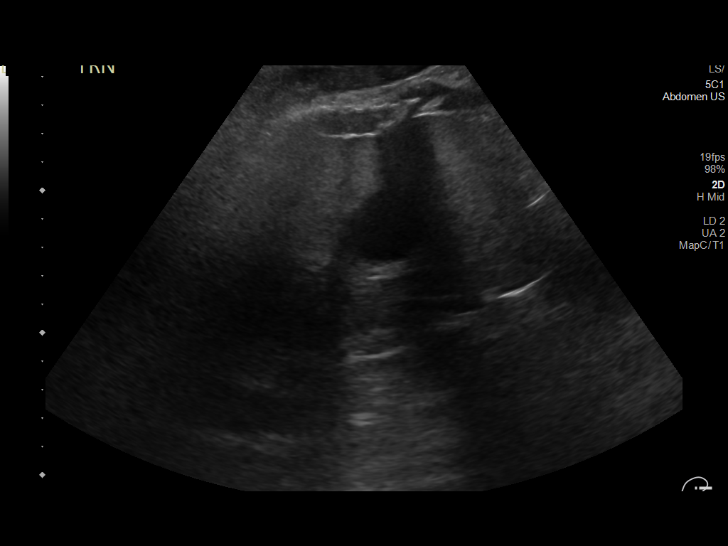
[im 23/49]
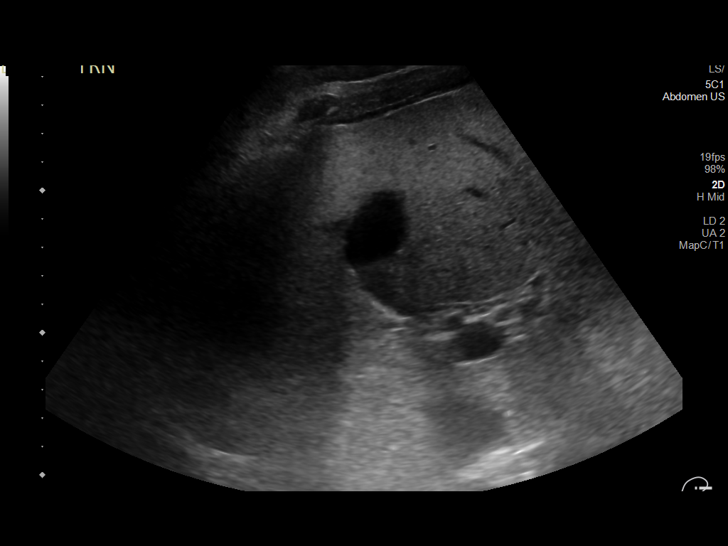
[im 27/49]
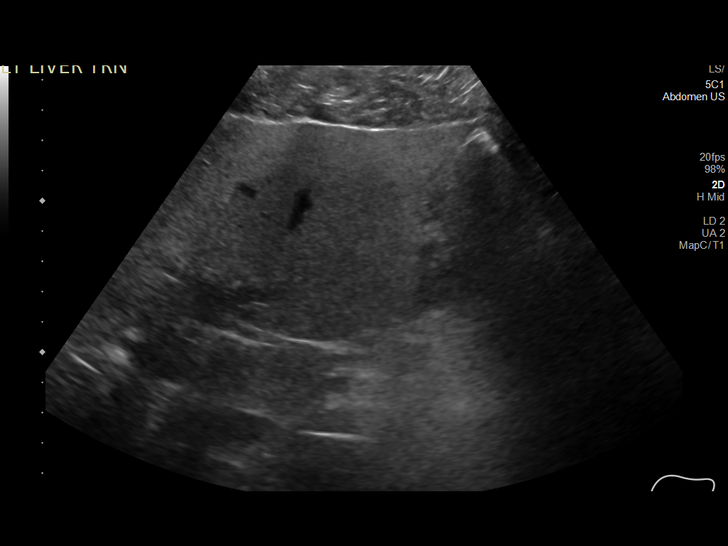
[im 31/49]
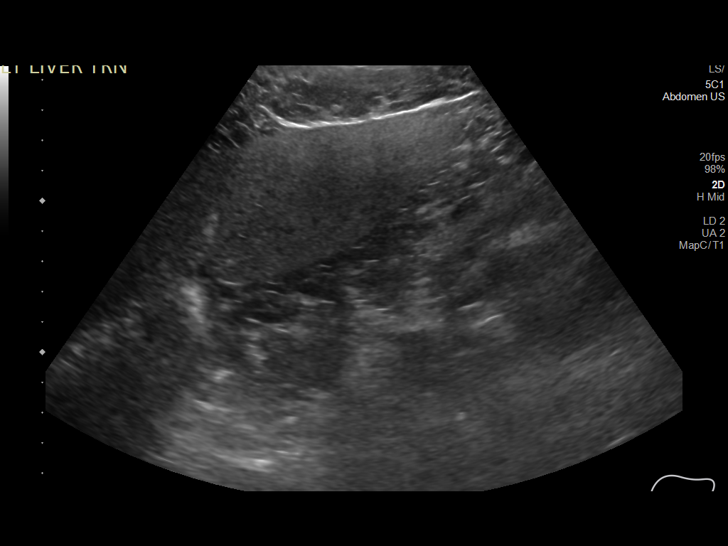
[im 33/49]
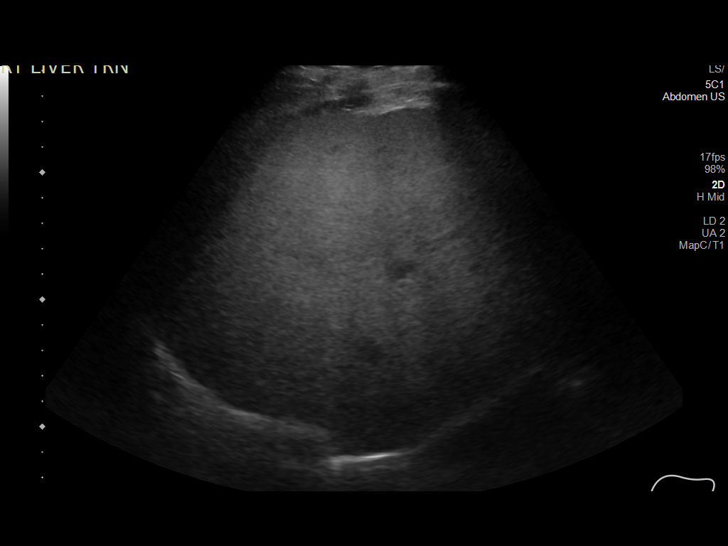
[im 37/49]
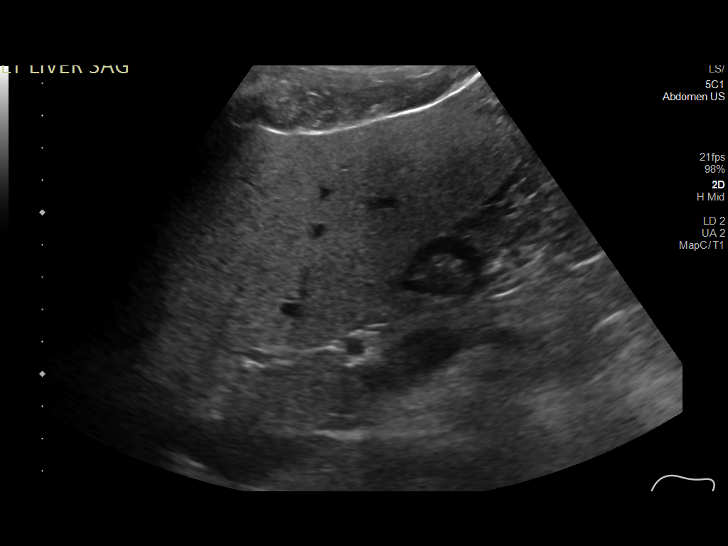
[im 41/49]
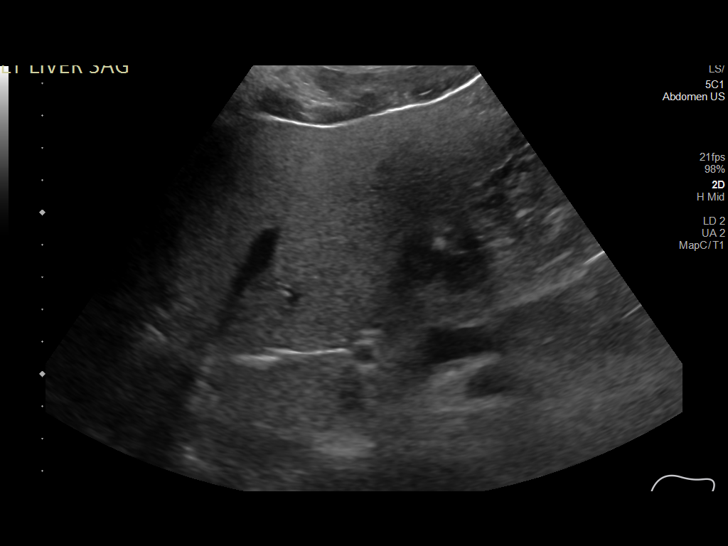
[im 45/49]
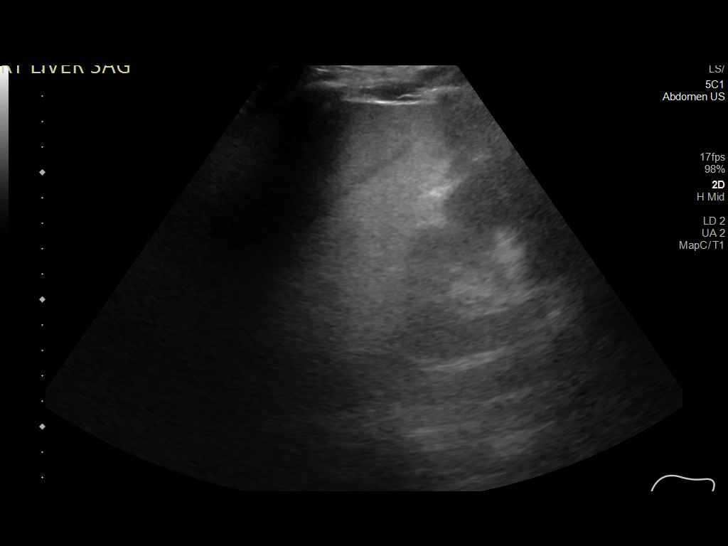
[im 49/49]
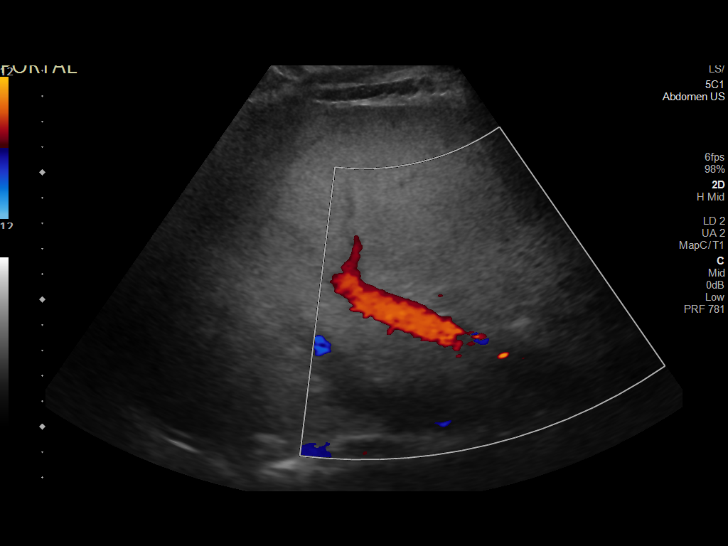

[14 of 25 positions shown; findings below may reference images not displayed]

FINDINGS: Gallbladder:

No gallstones or wall thickening visualized. No sonographic Murphy
sign noted by sonographer.

Common bile duct:

Diameter: 4.5 mm

Liver:

Increase in echogenicity likely related to fatty infiltration. No
focal mass is noted. Portal vein is patent on color Doppler imaging
with normal direction of blood flow towards the liver.

Other: None.
IMPRESSION: Fatty liver.

No other focal abnormality is noted.

## 2021-02-07 IMAGING — MR MR FOOT*R* WO/W CM
4 of 9 series · 19 of 40 positions shown · IV contrast (gadavist)
Comparison: None.

CLINICAL DATA: Ulcer great toe

EXAM:
MRI OF THE RIGHT FOREFOOT WITHOUT AND WITH CONTRAST
TECHNIQUE: Multiplanar, multisequence MR imaging of the right forefoot was
performed before and after the administration of intravenous
contrast.
CONTRAST:  10mL GADAVIST GADOBUTROL 1 MMOL/ML IV SOLN

[Series 4: T1 · coronal · 3.0mm · 0.27mm/px · 6 of 89 slices shown (1 of 2)]
[im 1/89]
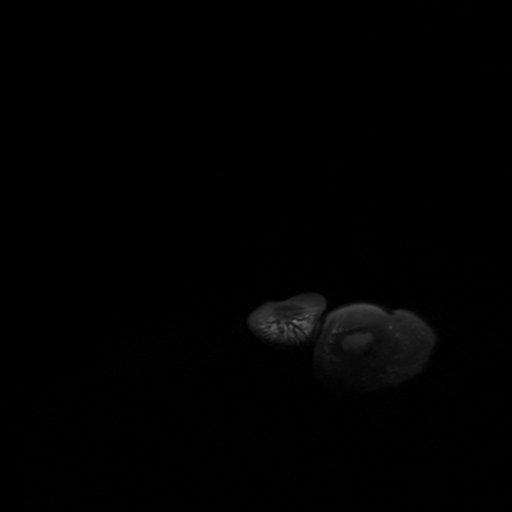
[im 18/89]
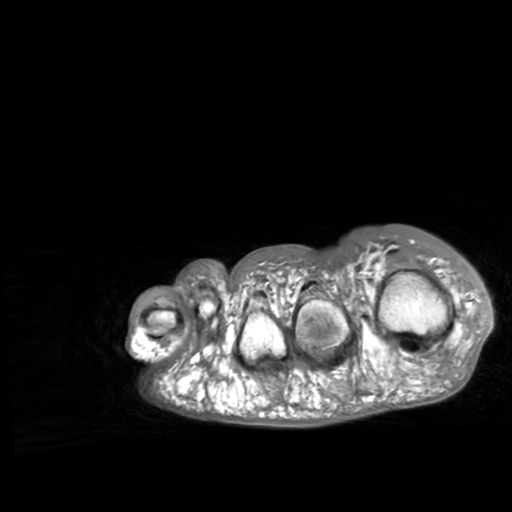
[im 36/89]
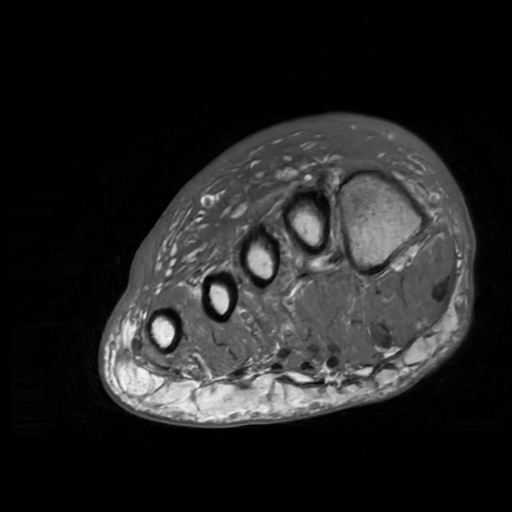
[im 53/89]
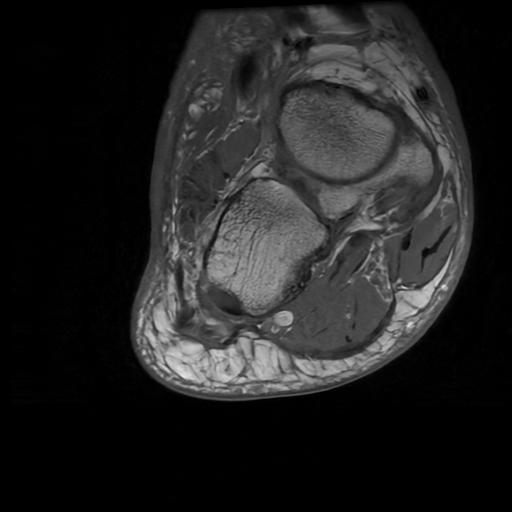
[im 71/89]
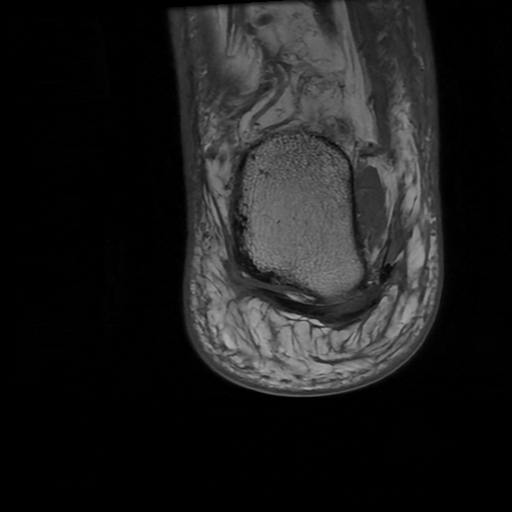
[im 89/89]
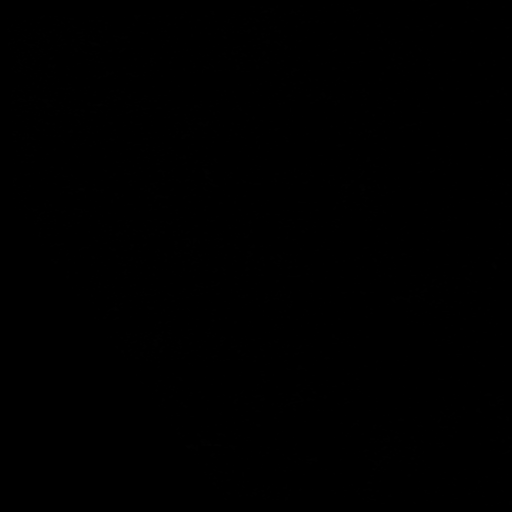

[Series 5: T1 fat-sat · coronal · non-contrast · 3.0mm · 0.27mm/px · 4 of 89 slices shown]
[im 1/89]
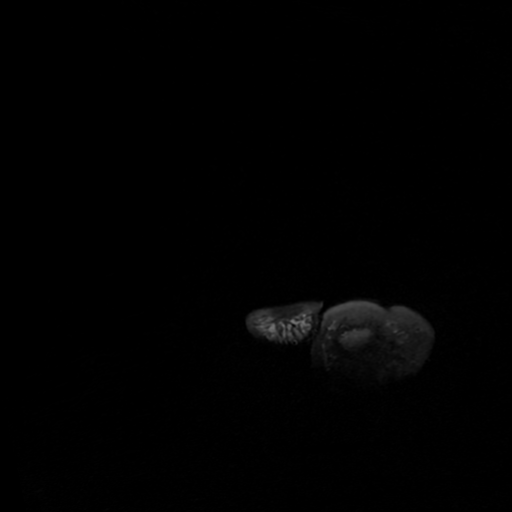
[im 18/89]
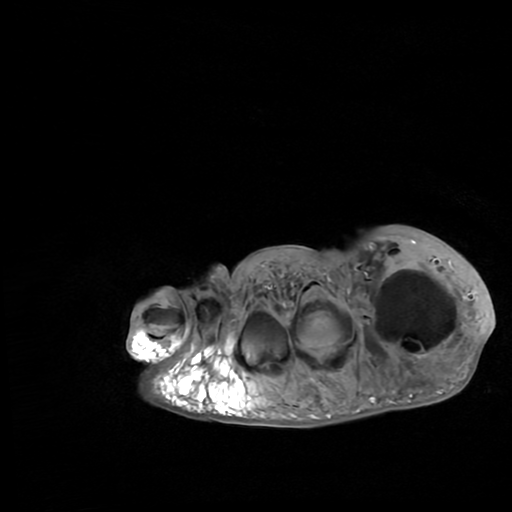
[im 53/89]
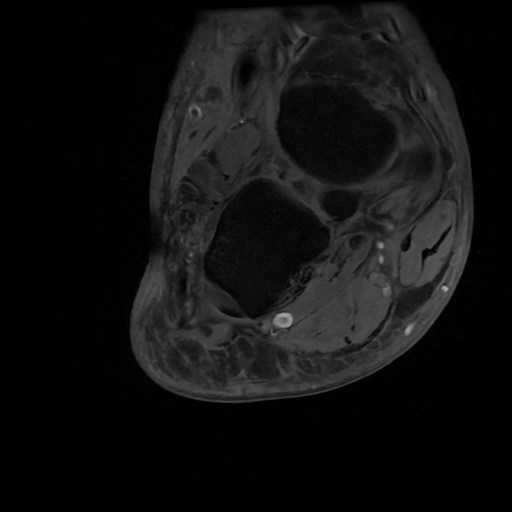
[im 89/89]
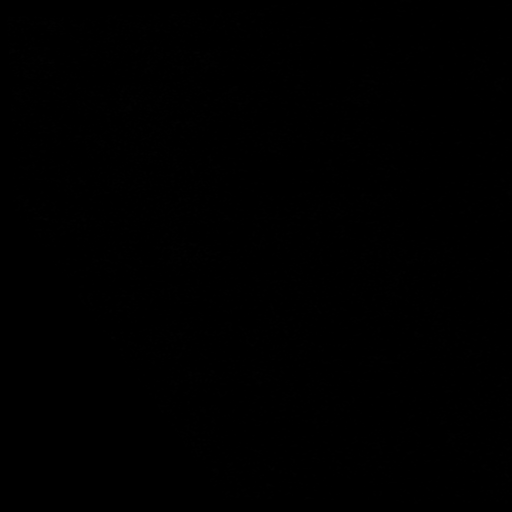

[Series 6: T2 fat-sat · coronal · 3.0mm · 0.27mm/px · 6 of 89 slices shown]
[im 1/89]
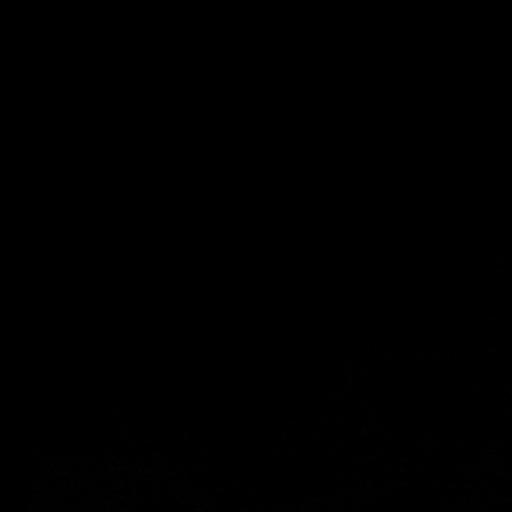
[im 18/89]
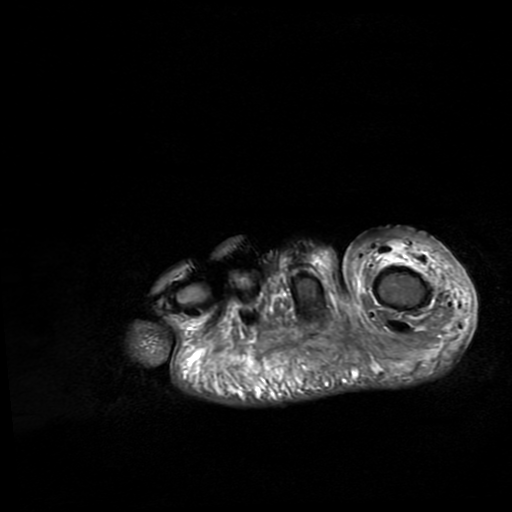
[im 36/89]
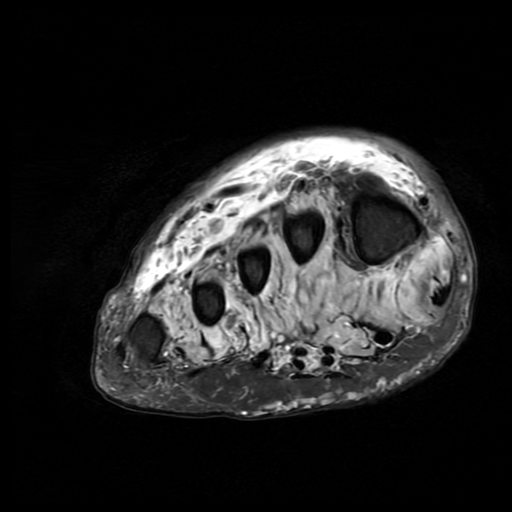
[im 53/89]
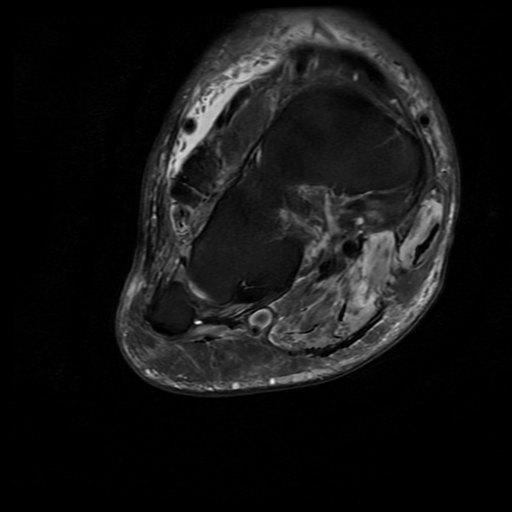
[im 71/89]
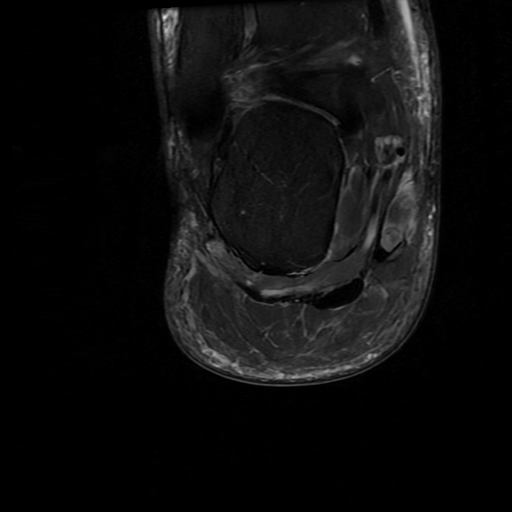
[im 89/89]
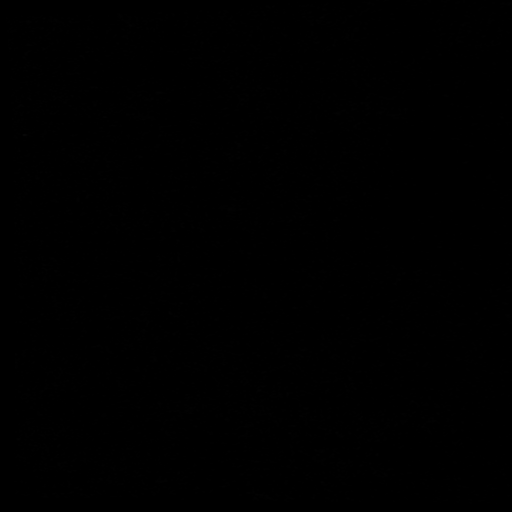

[Series 8: T1 · axial · 3.0mm · 0.62mm/px · z∈[-121,-3]mm · 3 of 37 slices shown (2 of 2)]
[im 1/37]
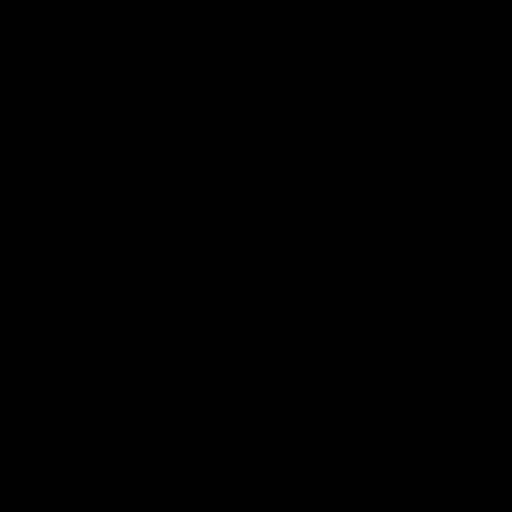
[im 19/37]
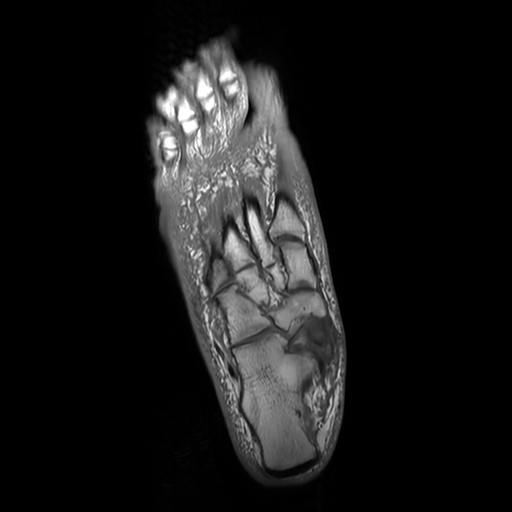
[im 37/37]
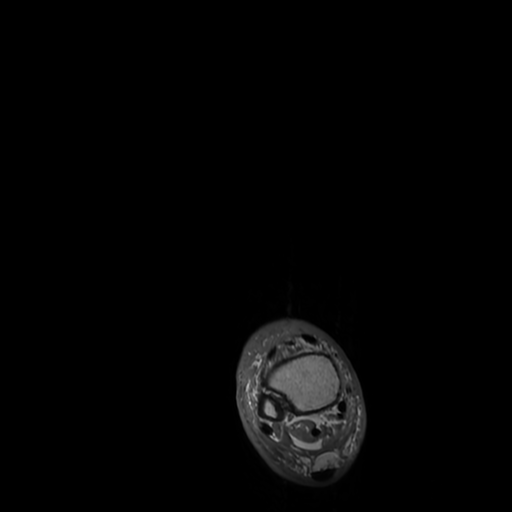

[19 of 40 positions shown; findings below may reference images not displayed]

FINDINGS: Bones/Joint/Cartilage

Distal first phalanx is somewhat limited in evaluation due to
technique. No osseous fracture cortical destruction or periosteal
reaction is seen. No large joint effusion is noted.

Ligaments

The Lisfranc and collateral ligaments are intact.

Muscles and Tendons

Diffusely increased signal is seen within the muscles the forefoot.
The flexor and extensor tendons are intact.

Soft tissues

Area of superficial ulceration seen the dorsum the first digit.
There is significant diffuse skin thickening and subcutaneous edema
seen along the dorsum of the foot. No loculated fluid collections.
IMPRESSION: Somewhat limited evaluation of the distal first phalanx. However no
definite evidence of osteomyelitis.

Area of superficial ulceration on the dorsum of the first digit with
diffuse cellulitis of the forefoot. No soft tissue abscess.

## 2021-02-07 IMAGING — DX DG CHEST 1V PORT
1 series · 1 of 1 positions shown · non-contrast
Comparison: [DATE]

CLINICAL DATA: Bilateral leg swelling

EXAM:
PORTABLE CHEST 1 VIEW

[chest ap]
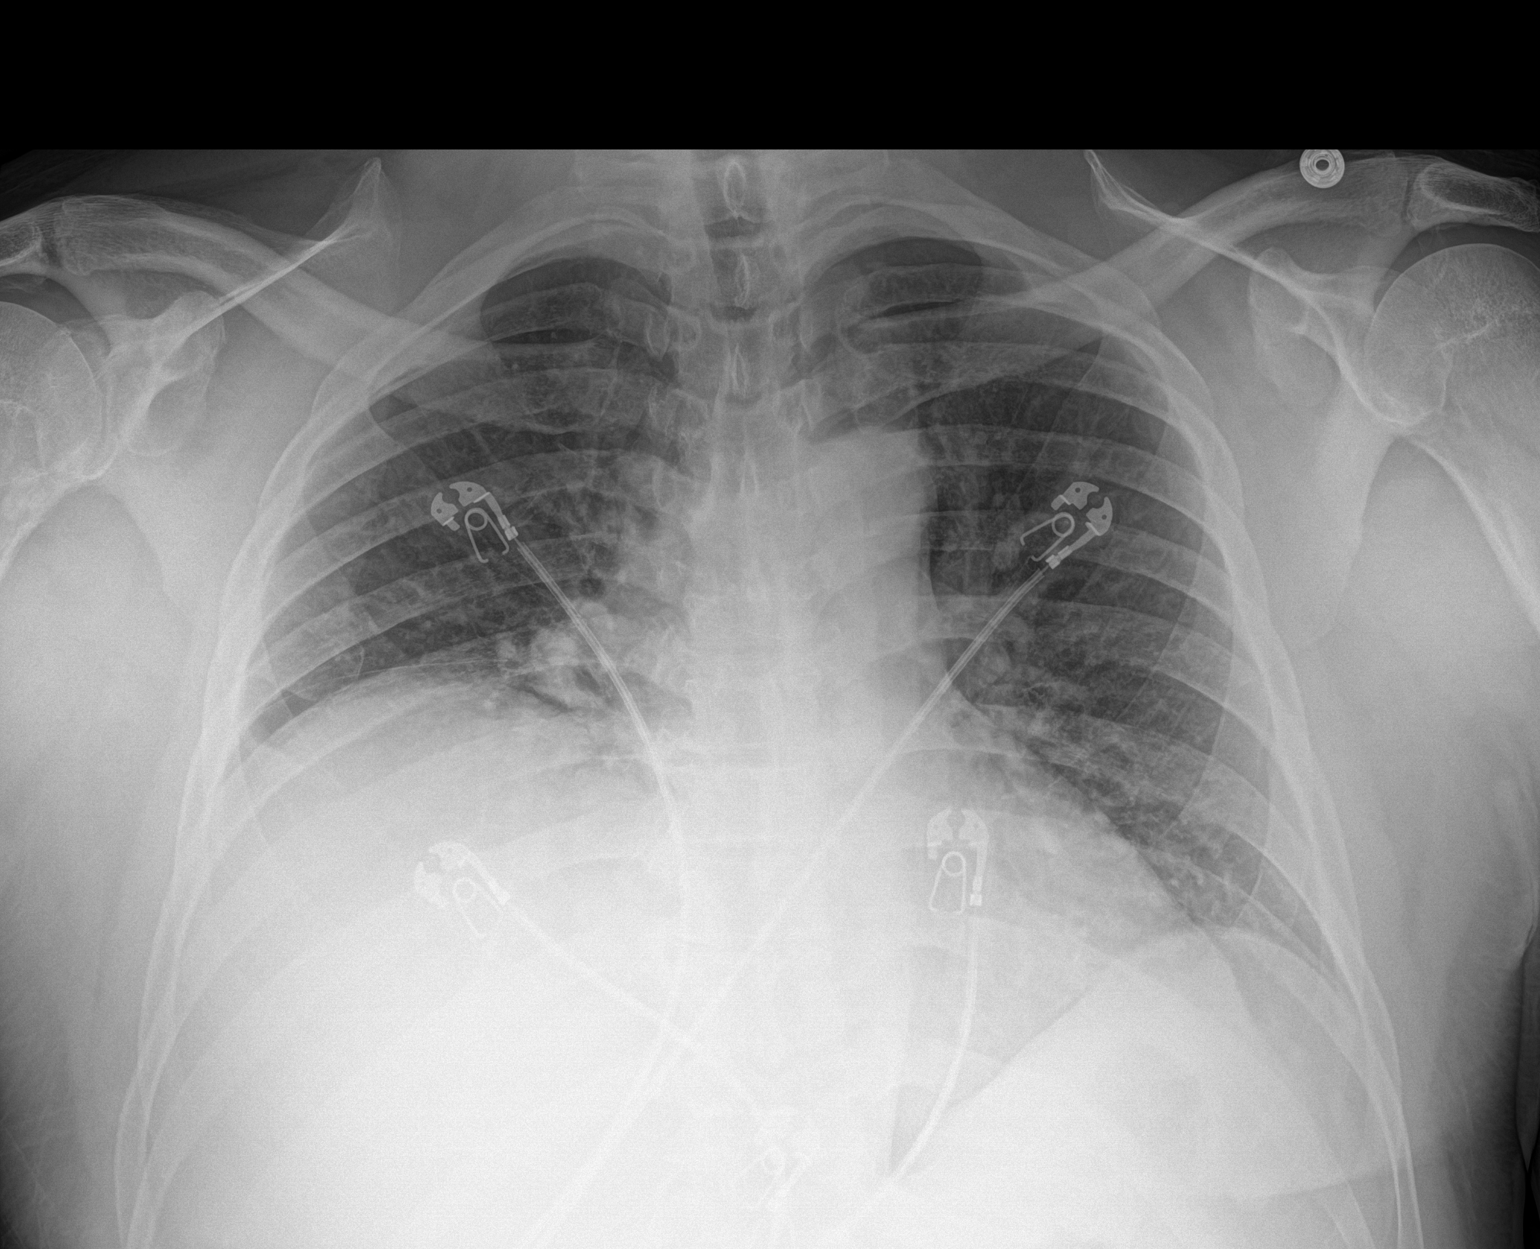

[1 of 1 positions shown; findings below may reference images not displayed]

FINDINGS: The heart size and mediastinal contours are mildly enlarged. There
is prominence of the central pulmonary vasculature. There is stable
elevation of the right hemidiaphragm. No pleural effusion is seen.
No acute osseous abnormality.
IMPRESSION: Mild pulmonary vascular congestion.

## 2021-02-07 MED ORDER — VANCOMYCIN HCL 1500 MG/300ML IV SOLN
1500.0000 mg | INTRAVENOUS | Status: DC
  Filled 2021-02-07: qty 300

## 2021-02-07 MED ORDER — INSULIN ASPART 100 UNIT/ML ~~LOC~~ SOLN
0.0000 [IU] | Freq: Three times a day (TID) | SUBCUTANEOUS | Status: DC
  Administered 2021-02-08 (×2): 2 [IU] via SUBCUTANEOUS
  Administered 2021-02-08 – 2021-02-09 (×4): 3 [IU] via SUBCUTANEOUS

## 2021-02-07 MED ORDER — FOLIC ACID 1 MG PO TABS
1.0000 mg | ORAL_TABLET | Freq: Every day | ORAL | Status: DC
  Administered 2021-02-08 – 2021-02-09 (×2): 1 mg via ORAL
  Filled 2021-02-07 (×2): qty 1

## 2021-02-07 MED ORDER — METRONIDAZOLE IN NACL 5-0.79 MG/ML-% IV SOLN
500.0000 mg | Freq: Three times a day (TID) | INTRAVENOUS | Status: DC
  Administered 2021-02-08: 500 mg via INTRAVENOUS
  Filled 2021-02-07 (×2): qty 100

## 2021-02-07 MED ORDER — HEPARIN SODIUM (PORCINE) 5000 UNIT/ML IJ SOLN
5000.0000 [IU] | Freq: Three times a day (TID) | INTRAMUSCULAR | Status: DC
  Administered 2021-02-07 – 2021-02-09 (×6): 5000 [IU] via SUBCUTANEOUS
  Filled 2021-02-07 (×6): qty 1

## 2021-02-07 MED ORDER — GADOBUTROL 1 MMOL/ML IV SOLN
10.0000 mL | Freq: Once | INTRAVENOUS | Status: AC | PRN
  Administered 2021-02-07: 10 mL via INTRAVENOUS

## 2021-02-07 MED ORDER — THIAMINE HCL 100 MG/ML IJ SOLN: 100.0000 mg | Freq: Every day | INTRAMUSCULAR | Status: DC

## 2021-02-07 MED ORDER — INSULIN ASPART 100 UNIT/ML ~~LOC~~ SOLN: 0.0000 [IU] | Freq: Every day | SUBCUTANEOUS | Status: DC

## 2021-02-07 MED ORDER — ADULT MULTIVITAMIN W/MINERALS CH
1.0000 | ORAL_TABLET | Freq: Every day | ORAL | Status: DC
  Administered 2021-02-08 – 2021-02-09 (×2): 1 via ORAL
  Filled 2021-02-07 (×2): qty 1

## 2021-02-07 MED ORDER — VANCOMYCIN HCL 2000 MG/400ML IV SOLN
2000.0000 mg | Freq: Once | INTRAVENOUS | Status: AC
  Administered 2021-02-07: 2000 mg via INTRAVENOUS
  Filled 2021-02-07: qty 400

## 2021-02-07 MED ORDER — LORAZEPAM 2 MG/ML IJ SOLN: 1.0000 mg | INTRAMUSCULAR | Status: DC | PRN

## 2021-02-07 MED ORDER — FUROSEMIDE 10 MG/ML IJ SOLN: 40.0000 mg | Freq: Every day | INTRAMUSCULAR | Status: DC

## 2021-02-07 MED ORDER — LORAZEPAM 1 MG PO TABS: 1.0000 mg | ORAL_TABLET | ORAL | Status: DC | PRN

## 2021-02-07 MED ORDER — FUROSEMIDE 10 MG/ML IJ SOLN
40.0000 mg | Freq: Once | INTRAMUSCULAR | Status: AC
  Administered 2021-02-07: 40 mg via INTRAVENOUS
  Filled 2021-02-07: qty 4

## 2021-02-07 MED ORDER — THIAMINE HCL 100 MG PO TABS
100.0000 mg | ORAL_TABLET | Freq: Every day | ORAL | Status: DC
  Administered 2021-02-08 – 2021-02-09 (×2): 100 mg via ORAL
  Filled 2021-02-07 (×2): qty 1

## 2021-02-07 MED ORDER — INSULIN GLARGINE 100 UNIT/ML ~~LOC~~ SOLN
5.0000 [IU] | Freq: Every day | SUBCUTANEOUS | Status: DC
  Administered 2021-02-08 – 2021-02-09 (×2): 5 [IU] via SUBCUTANEOUS
  Filled 2021-02-07 (×2): qty 0.05

## 2021-02-07 MED ORDER — SODIUM CHLORIDE 0.9 % IV SOLN
2.0000 g | Freq: Three times a day (TID) | INTRAVENOUS | Status: DC
  Administered 2021-02-08: 2 g via INTRAVENOUS
  Filled 2021-02-07 (×4): qty 2

## 2021-02-07 NOTE — ED Provider Notes (Signed)
[facility] EMERGENCY DEPARTMENT Provider Note   CSN: [id number] Arrival date & time: 02/07/21  1156     History No chief complaint on file.   Jesse [name] is a 60 y.o. male.  HPI   Patient with significant medical history of diabetes type 2 uncontrolled, AKI, presents with chief complaint of bilateral leg swelling.  Endorses that he has been having worsening leg swelling for the last 2 weeks, he denies  recent trauma to the area, denies experiencing this in the past, has no history of CHF, denies orthopnea, shortness of breath, chest pain.  Patient states that his legs feel tight but denies  pain with it, paresthesias or weakness in the legs, denies seeing any drainage or discharge, denies systemic symptoms like fevers or chills.  He states that he has no medical history and does not take any medication on a daily basis.    After reviewing patient's chart patient seen here approximately 3 weeks ago for same complaint, was noted that he had 4+ pedal edema with concerns of diabetic ulcer on his right great toe, unfortunately patient left AMA.  Patient denies any alleviating factors.  Patient denies headaches, fevers, chills, shortness of breath, chest pain, abdominal pain, nausea, vomiting, diarrhea.  History reviewed. No pertinent past medical history.  Patient Active Problem List   Diagnosis Date Noted  . Anasarca   . 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 Hypokalemia 01/28/2014  . DM (diabetes mellitus), type 2, uncontrolled (HCC) 01/27/2014  . AKI (acute kidney injury) (HCC) 01/27/2014  . Leukocytosis, unspecified 01/27/2014  . Facial cellulitis 01/26/2014  . Dental abscess 01/26/2014  . Infected dental carries 01/26/2014  . Cellulitis and abscess 01/26/2014    History reviewed. No pertinent surgical history.     No family history on file.  Social History   Tobacco Use   . Smoking status: Never Smoker  . Smokeless tobacco: Never Used  Substance Use Topics  . Alcohol use: Yes    Alcohol/week: 2.0 standard drinks    Types: 2 Cans of beer per week    Comment: Occasional  . Drug use: No    Home Medications Prior to Admission medications   Medication Sig Start Date End Date Taking? Authorizing Provider  metFORMIN (GLUCOPHAGE) 500 MG tablet Take 1 tablet (500 mg total) by mouth 2 (two) times daily with a meal. Patient not taking: Reported on 02/07/2021 01/14/16   Muthersbaugh, Dahlia Client, PA-C  predniSONE (DELTASONE) 20 MG tablet 3 tabs po daily x 3 days, then 2 tabs x 3 days, then 1.5 tabs x 3 days, then 1 tab x 3 days, then 0.5 tabs x 3 days Patient not taking: Reported on 02/07/2021 01/14/16   Muthersbaugh, Dahlia Client, PA-C  glipiZIDE (GLUCOTROL) 5 MG tablet Take 1 tablet (5 mg total) by mouth daily before breakfast. Patient not taking: Reported on 01/14/2016 01/31/14 01/14/16  Christiane Ha, MD    Allergies    Patient has no known allergies.  Review of Systems   Review of Systems  Constitutional: Negative for chills and fever.  HENT: Negative for congestion and sore throat.   Respiratory: Negative for shortness of breath.   Cardiovascular: Negative for chest pain.  Gastrointestinal: Negative for abdominal pain, diarrhea, nausea and vomiting.  Genitourinary: Negative for dysuria, enuresis and flank pain.  Musculoskeletal: Negative for back pain.  Skin: Negative for rash.  Bilateral leg swelling  Neurological: Negative for dizziness and headaches.  Hematological: Does not bruise/bleed easily.    Physical Exam Updated Vital Signs BP (!) 163/91 (BP Location: Right Arm)   Pulse 87   Temp 98.4 F (36.9 C) (Oral)   Resp 17   SpO2 99%   Physical Exam Vitals and nursing note reviewed.  Constitutional:      General: He is not in acute distress.    Appearance: He is not ill-appearing.  HENT:     Head: Normocephalic and atraumatic.     Nose: No  congestion.  Eyes:     Conjunctiva/sclera: Conjunctivae normal.  Cardiovascular:     Rate and Rhythm: Normal rate and regular rhythm.     Pulses: Normal pulses.     Heart sounds: No murmur heard. No friction rub. No gallop.   Pulmonary:     Effort: No respiratory distress.     Breath sounds: No wheezing, rhonchi or rales.  Abdominal:     Palpations: Abdomen is soft.     Tenderness: There is no abdominal tenderness. There is no right CVA tenderness or left CVA tenderness.  Musculoskeletal:     Cervical back: No rigidity.     Right lower leg: Edema present.     Left lower leg: Edema present.     Comments: Patient's legs were visualized she has noted 3+ pitting edema up to the mid thigh, there is noted chronic lymphedema changes along the legs, he also has a noted diabetic ulcer on the right great toe, with necrotic looking tissue on the plantar aspect.  Patient has full range of motion at his toes, ankles, knees, neurovascular fully intact.  Skin:    General: Skin is warm and dry.  Neurological:     Mental Status: He is alert.  Psychiatric:        Mood and Affect: Mood normal.             ED Results / Procedures / Treatments   Labs (all labs ordered are listed, but only abnormal results are displayed) Labs Reviewed  COMPREHENSIVE METABOLIC PANEL - Abnormal; Notable for the following components:      Result Value   Glucose, Bld 251 (*)    All other components within normal limits  CBC - Abnormal; Notable for the following components:   RBC 4.09 (*)    All other components within normal limits  URINALYSIS, ROUTINE W REFLEX MICROSCOPIC - Abnormal; Notable for the following components:   Glucose, UA 150 (*)    Hgb urine dipstick SMALL (*)    All other components within normal limits  RESP PANEL BY RT-PCR (FLU A&B, COVID) ARPGX2  CULTURE, BLOOD (ROUTINE X 2)  CULTURE, BLOOD (ROUTINE X 2)  BRAIN NATRIURETIC PEPTIDE  LACTIC ACID, PLASMA  LACTIC ACID, PLASMA  HEMOGLOBIN  A1C    EKG None  Radiology DG Chest Portable 1 View  Result Date: 02/07/2021 CLINICAL DATA:  Bilateral leg swelling EXAM: PORTABLE CHEST 1 VIEW COMPARISON:  January 17, 2021 FINDINGS: The heart size and mediastinal contours are mildly enlarged. There is prominence of the central pulmonary vasculature. There is stable elevation of the right hemidiaphragm. No pleural effusion is seen. No acute osseous abnormality. IMPRESSION: Mild pulmonary vascular congestion. Electronically Signed   By: Jonna Clark M.D.   On: 02/07/2021 14:51   DG Foot 2 Views Right  Result Date: 02/07/2021 CLINICAL DATA:  Foot pain leg swelling EXAM: RIGHT FOOT - 2 VIEW COMPARISON:  None.  FINDINGS: There is no evidence of fracture or dislocation. 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 There is diffuse dorsal soft tissue swelling seen. Calcaneal enthesophyte is noted. IMPRESSION: No acute osseous abnormality. Significant dorsal soft tissue swelling. Electronically Signed   By: Jonna Clark M.D.   On: 02/07/2021 14:52    Procedures Procedures   Medications Ordered in ED Medications  vancomycin (VANCOREADY) IVPB 1500 mg/300 mL (has no administration in time range)  furosemide (LASIX) injection 40 mg (40 mg Intravenous Given 02/07/21 1643)  vancomycin (VANCOREADY) IVPB 2000 mg/400 mL (0 mg Intravenous Stopped 02/07/21 1814)    ED Course  I have reviewed the triage vital signs and the nursing notes.  Pertinent labs & imaging results that were available during my care of the patient were reviewed by me and considered in my medical decision making (see chart for details).    MDM Rules/Calculators/A&P                         Initial impression-patient presents with bilateral leg swelling.  He is alert, does not appear acute distress, vital signs reassuring.  Concern for early osteomyelitis of the great right toe, with worsening lymphedema bilaterally in the  lower extremities.  Will obtain basic lab work, chest x-ray, x-ray of foot, and reassess.  Work-up-CBC is unremarkable, CMP shows elevated glucose of 251.  Lactic 1.7, respiratory panel negative.  BMP 8.9  Reassessment patient is reassessed, has no complaints this time, vital signs remained stable.  Updated patient about lab work and imaging, I recommend he be admitted for anasarca as well as diabetic ulcer.  Patient is agreeable to this.  Consult will consult with hospitalist for admission due to anasarca and diabetic foot ulcer.  Spoke with Dr. Allena Katz of hospitalist team, she has accepted the patient, she requested MRI of foot for further evaluation.  She will come down and assess the patient.  Rule out-low suspicion for systemic infection as patient is nontoxic-appearing, vital signs reassuring, lactic is within normal limits, no leukocytosis noted on CBC.  Low suspicion for DKA or HHS as glucose is only mildly elevated, there is no anion gap present.  Low suspicion for CHF exacerbation as lung sounds are clear bilaterally, BMP is 8.6, there is only mildly faster congestion noted on chest x-ray.  Patient does have noted increase swelling of the lower extremities, I suspect this is worsening lymphedema but cannot exclude CHF.  Low suspicion for osteomyelitis of the right great toe as imaging is negative for acute findings, does significant edema of the toe. I suspect he is suffering from a diabetic ulcer.  Plan-admit patient for anasarca secondary to chronic lymphedema as well as diabetic ulcer of the great right toe.  Patient care being transferred to hospitalist team for further evaluation   Final Clinical Impression(s) / ED Diagnoses Final diagnoses:  Anasarca  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 2 diabetes mellitus, limited to breakdown of skin [facility])    Rx / DC Orders ED Discharge Orders    None       Barnie Del 02/07/21 Michaele Offer,  MD 02/07/21 1907

## 2021-02-07 NOTE — ED Triage Notes (Signed)
Patient here for further evaluation of ongoing bilateral leg swelling. Seen earlier in mouth for same and states that he left prior to admission because had stuff to do. Patient complains of pain to legs, no cp, no SOB

## 2021-02-07 NOTE — ED Notes (Signed)
Report given to RN [id number]. Pt currently in scan. Pt to go to floor from scan.

## 2021-02-07 NOTE — H&P (Addendum)
Family Medicine Teaching [facility] Admission History and Physical Service Pager: [phone]  Patient name: Jesse [name]     Medical record number: [id number] Date of birth: 31-Mar-1961          Age: 60 y.o.    Gender: male  Primary Care Provider: Patient, No Pcp Per (Inactive) Consultants:  Code Status: FULL  Preferred Emergency Contact: [name] [phone]  Chief Complaint: Leg swelling  Assessment and Plan: Jesse [name] is a 60 y.o. male presenting with lower extremity swelling. PMH is significant for T2DM.   Bilateral leg swelling, unclear eitology Patient reports 2 weeks of ongoing bilateral leg swelling though does feel it is improved with baths.  In ED, hypertensive but afebrile and otherwise stable. CBC unremarkable with normal WBC, CMP also unremarkable apart from hyperglycemia. Started on IV Lasix 40 mg x 1.  Appears to have had great urine output with this 1 dose.  Examination notable for 3-4+ pitting edema bilateral lower extremities from feet to knee. Differential includes heart failure, though unlikely given BNP 8.9, chest x-ray with mild pulmonary vascular congestion, without orthopnea, JVD and clear lung exam. Will evaluate further with formal echo. Other possibility would be anasarca from cirrhosis given his years of alcohol abuse. Will evaluate with RUQ U/S. Reassuringly, his liver enzymes are normal. Lymphedema is a consideration but patient has no pain. Unable to appreciate distal pulses on examination, concerning for possible vascular etiology such as limb ischemia- though lower extremities are warm and patient does not have pain that would be expected with this. Will need to r/o DVT though also feel this is less likely given b/l edema, no risk factors and Wells score of 1. Also considered cellulitis given skin changes and edema however lower on differential and less likely to be bilateral. Pt denies fevers and no leukocytosis which is reassuring.Given unclear  etiology at this time, will hold on further diuresis. Patient has no PCP and is at high-risk for non-adherence or follow up. Will admit for further work-up and management. Reassuring that at this time he is stable and non-septic. Pt's reported weight is 230lb  -Admit to FPTS med-surg, Attending Dr Miquel Dunn -Up with assistance -Vitals per floor routine -PT/OT eval and treat -F/u MRI right foot -F/u RUQ U/S -F/u blood cultures  -Echo -Continue vancomycin -Lipid panel -TSH -Add cefepime and Flagyl -Strict I/O -Daily weights -TOC consult for PCP needs -Heparin for DVT ppx  -am CBC, BMP, lipid panel, Mg, Phosp, TSH   Right Great Toe Infection Right great toe with posterior ulcer, appears chronic and nonhealing.  Concern for diabetic ulcer, given uncontrolled diabetes.  Also concern for osteomyelitis.  X-ray negative, though will need to obtain MRI for more definitive evaluation.  Until imaging results, will cover with IV antibiotics.   -F/u MRI foot  -Continue IV Vancomycin, Flagyl, Cefepime -Can de-escalate/d/c antibiotics upon further work-up -Wound care consult   Uncontrolled Type 2 Diabetes Mellitus  Glucose 251 on BMP. UA without protein. Home meds reported as Metformin 500mg  BID pt reports he is not ever taken it.  States diabetes was diagnosed 2 years ago, though appears that prescription was sent in 2017. Pt reports he does not check CBGs at home. Last A1c on file was 10.5 in 2015. -Hold metformin  -Follow up Hgb A1c -Monitor CBGs  -sSSI with at bedtime coverage -Lantus 5 units tomorrow -Will need outpatient eye examination -Consult diabetes coordinator   Microscopic Hematuria Micoscopic UA with 6-10 RBC.  -Outpatient follow up  Elevated blood pressures Hypertensive to 188 systolic in the ED, 106 diastolic.  Has no prior history of hypertension.  Has no home medications but also reports he has not seen a PCP in 20 years. -Vitals per floor protocol -Consider starting  medication while inpatient  Alcohol Abuse Admits to drinking a two 40 oz beers a day, along with a four loko for the last 5-6 years. Last ETOH drink was 7pm yesterday. Denies withdrawals, seizures in the past. Pt is interested in stopping, feels that he can do it on his own but would like resources. He did not know how important it was so reduce EOTH intake. He said he thinks he can quite easily. -CIWA with Ativan as needed -CSW consult: substance abuse resources -Thiamine 100mg  daily  -Folate 1mg  daily   Health Maintenance  Poor Health Literacy  Patient reports not seeing a PCP in about 20 years.  Is behind on all health maintenance.  Will consult social work for PCP needs.  Additionally, appears to have very poor health literacy as he felt his right toe wound may have occurred from stepping wrong/from a strain. Also did not understand the importance of diabetes control or medication adherence.  -Recommend outpatient colonoscopy -Follow-up health screening labs listed above  FEN/GI: Heart healthy/carb modified Prophylaxis: Heparin   Disposition: Med-surg  History of Present Illness:  Jesse [name] is a 60 y.o. male presenting with lower extremity swelling.  Pt works as a [name]. He thinks he "strained his legs" while working 2 weeks ago. His legs have swollen up. He said he thought something may have bitten him. He also bumped into a stick which cut into his skin 1 week ago. His swelling was worse 2 weeks ago and has got better recently. At home he has tried epsom salts and warm water.   Right big toe became infected 2 weeks ago, draining purulent material. He has been putting peroxide on it and using thin socks. It does not hurt. Does not feel like he has gained weight. He denies swelling elsewhere. Denies SOB or fevers. Does endorse a non productive  cough where he is around dust. He finds it difficult to walk due to LE edema. Denies recent falls. Does endorse tingling at the  bottom of his feet.   Previously admitted to the hospital 2 years ago for dental abscess. Denies hx of osteomyelitis.  Denies smoking. Drinks beer 40 x 2 a day. No illicit drug use. No history of surgeries. Denies covid vaccines.    ED Course: Hypertensive to 188 systolic, tachypnic to 30. Given Lasix 40 mg IV x1. Blood cultures collected prior to antibiotics. Vancomycin started. CBC    Review Of Systems: Per HPI with the following additions:   Review of Systems  Constitutional: Negative for fever and unexpected weight change.  Respiratory: Positive for cough. Negative for shortness of breath.   Cardiovascular: Positive for leg swelling. Negative for chest pain.  Gastrointestinal: Negative for abdominal pain, blood in stool, diarrhea, nausea and vomiting.  Genitourinary: Negative for difficulty urinating.  Musculoskeletal: Positive for gait problem.  Neurological: Negative for dizziness, seizures, weakness and headaches.         Patient Active Problem List   Diagnosis Date Noted  . Hypokalemia 01/28/2014  . DM (diabetes mellitus), type 2, uncontrolled (HCC) 01/27/2014  . AKI (acute kidney injury) (HCC) 01/27/2014  . Leukocytosis, unspecified 01/27/2014  . Facial cellulitis 01/26/2014  . Dental abscess 01/26/2014  . Infected dental carries 01/26/2014  .  Cellulitis and abscess 01/26/2014    Past Medical History: History reviewed. No pertinent past medical history.  Past Surgical History: History reviewed. No pertinent surgical history.  Social History: Social History        Tobacco Use  . Smoking status: Never Smoker  . Smokeless tobacco: Never Used  Substance Use Topics  . Alcohol use: Yes    Alcohol/week: 2.0 standard drinks    Types: 2 Cans of beer per week    Comment: Occasional  . Drug use: No   Additional social history: Lives alone.  Please also refer to relevant sections of EMR.  Family History: Denies FHx.   Allergies and  Medications: No Known Allergies No current facility-administered medications on file prior to encounter.         Current Outpatient Medications on File Prior to Encounter  Medication Sig Dispense Refill  . metFORMIN (GLUCOPHAGE) 500 MG tablet Take 1 tablet (500 mg total) by mouth 2 (two) times daily with a meal. (Patient not taking: Reported on 02/07/2021) 60 tablet 1  . predniSONE (DELTASONE) 20 MG tablet 3 tabs po daily x 3 days, then 2 tabs x 3 days, then 1.5 tabs x 3 days, then 1 tab x 3 days, then 0.5 tabs x 3 days (Patient not taking: Reported on 02/07/2021) 27 tablet 0  . [DISCONTINUED] glipiZIDE (GLUCOTROL) 5 MG tablet Take 1 tablet (5 mg total) by mouth daily before breakfast. (Patient not taking: Reported on 01/14/2016) 30 tablet 0    Objective: BP (!) 164/93 (BP Location: Right Arm)   Pulse 83   Temp 98.1 F (36.7 C)   Resp (!) 22   SpO2 97%   Exam: General: Awake, alert, in no distress, pleasant Eyes: EOMI, anicteric ENTM: Poor dentition, MMM Neck: Supple Cardiovascular: RRR without murmur, 2+ radial pulses, unable to palpate distal lower extremity pulses (likely 2/2 edema) Respiratory: CTAB without wheezing/rhonchi/rales Gastrointestinal: mildly distended, non-tender in all quadrants, no rebound or guarding, no organomegaly appreciated MSK: No obvious joint or bony deformities  Derm: Excoriations and open wounds on b/l extremities, 3-4+ pitting edema to b/l lower extremities from foot to distal thigh, right great toe with posterior non-healing ulceration (see below)  Neuro: Alert and oriented, answers questions appropriately, follows commands, no tremor, moving all extremities spontaneously Psych: Appropriate mood and affect             Labs and Imaging: CBC BMET  Last Labs      Recent Labs  Lab 02/07/21 1210  WBC 8.0  HGB 13.2  HCT 39.3  PLT 245     Last Labs      Recent Labs  Lab 02/07/21 1210  NA 135  K 4.1  CL 102  CO2 25  BUN  13  CREATININE 1.14  GLUCOSE 251*  CALCIUM 9.3       EKG: Pending.  DG Chest Portable 1 View  Result Date: 02/07/2021 CLINICAL DATA:  Bilateral leg swelling EXAM: PORTABLE CHEST 1 VIEW COMPARISON:  January 17, 2021 FINDINGS: The heart size and mediastinal contours are mildly enlarged. There is prominence of the central pulmonary vasculature. There is stable elevation of the right hemidiaphragm. No pleural effusion is seen. No acute osseous abnormality. IMPRESSION: Mild pulmonary vascular congestion. Electronically Signed   By: Jonna ClarkBindu  Avutu M.D.   On: 02/07/2021 14:51   DG Foot 2 Views Right  Result Date: 02/07/2021 CLINICAL DATA:  Foot pain leg swelling EXAM: RIGHT FOOT - 2 VIEW COMPARISON:  None. FINDINGS: There  is no evidence of fracture or dislocation. 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 There is diffuse dorsal soft tissue swelling seen. Calcaneal enthesophyte is noted. IMPRESSION: No acute osseous abnormality. Significant dorsal soft tissue swelling. Electronically Signed   By: Jonna Clark M.D.   On: 02/07/2021 14:52    Sabino Dick, DO 02/07/2021, 5:20 PM PGY-1, [facility] FPTS Intern pager: [phone], text pages welcome . FPTS Upper-Level Resident Addendum   I have independently interviewed and examined the patient. I have discussed the above with the original author and agree with their documentation. My edits for correction/addition/clarification are in blue. Please see also any attending notes.   Towanda Octave MD PGY-2, [facility] 02/07/2021 7:18 PM  FPTS Service pager: [phone] (text pages welcome through [facility])

## 2021-02-07 NOTE — Discharge Summary (Deleted)
Family Medicine Teaching [facility] Admission History and Physical Service Pager: [phone]  Patient name: Jesse [name] Medical record number: [id number] Date of birth: Aug 16, 1961 Age: 60 y.o. Gender: male  Primary Care Provider: Patient, No Pcp Per (Inactive) Consultants:  Code Status: FULL  Preferred Emergency Contact: [name] [phone]  Chief Complaint: Leg swelling  Assessment and Plan: Jesse [name] is a 60 y.o. male presenting with lower extremity swelling. PMH is significant for T2DM.   Bilateral Leg Swelling Patient reports 2 weeks of ongoing bilateral leg swelling though does feel it is improved with baths.  In ED, hypertensive but afebrile and otherwise stable. CBC unremarkable with normal WBC, CMP also unremarkable apart from hyperglycemia. Started on IV Lasix 40 mg x 1.  Appears to have had great urine output with this 1 dose.  Examination notable for 3-4+ pitting edema bilateral lower extremities from feet to knee. Differential includes heart failure, though unlikely given BNP 8.9, chest x-ray with mild pulmonary vascular congestion, without orthopnea, JVD and clear lung exam. Will evaluate further with formal echo. Other possibility would be anasarca from cirrhosis given his years of alcohol abuse. Will evaluate with RUQ U/S. Reassuringly, his liver enzymes are normal. Lymphedema is a consideration but patient has no pain. Unable to appreciate distal pulses on examination, concerning for possible vascular etiology such as limb ischemia- though lower extremities are warm and patient does not have pain that would be expected with this. Will need to r/o DVT though also feel this is less likely given b/l edema, no risk factors and Wells score of 1. Given unclear etiology at this time, will hold on further diuresis. Patient has no PCP and is at high-risk for non-adherence or follow up. Will admit for further work-up and management. Reassuring that at this time he is stable and  non-septic.  Reported weight is 230lb  -Admit to FPTS med-surg, Attending Dr Miquel Dunn -Up with assistance -Vitals per floor routine -PT/OT eval and treat -F/u MRI right foot  -F/u RUQ U/S -F/u blood cultures  -Echo -Continue vancomycin -Lipid panel -TSH -Add cefepime and Flagyl -Strict I/O -Daily weights -TOC consult for PCP needs  Right Great Toe Infection Right great toe with posterior ulcer, appears chronic and nonhealing.  Concern for diabetic ulcer, given uncontrolled diabetes.  Also concern for osteomyelitis.  X-ray negative, though will need to obtain MRI for more definitive evaluation.  Until imaging results, will cover with IV antibiotics.   -F/u MRI foot  -Continue Vancomycin, Flagyl, Cefepime -Can de-escalate/d/c antibiotics upon further work-up  Uncontrolled Type 2 Diabetes Mellitus  Glucose 251 on BMP. UA without protein. Home meds reported as Metformin 500mg  BID pt reports he is not ever taken it.  States diabetes was diagnosed 2 years ago, though appears that prescription was sent in 2017. Pt reports he does not check CBGs at home. Last A1c on file was 10.5 in 2015. -Hold metformin  -Follow up Hgb A1c -Monitor CBGs  -sSSI with at bedtime coverage -Lantus 5 units tomorrow -Will need outpatient eye examination -Consult diabetes coordinator   Microscopic Hematuria Micoscopic UA with 6-10 RBC.  -Outpatient follow up  Elevated blood pressures Hypertensive to 188 systolic in the ED, 106 diastolic.  Has no prior history of hypertension.  Has no home medications but also reports he has not seen a PCP in 20 years. -Vitals per floor protocol -Consider starting medication while inpatient  Alcohol Abuse Admits to drinking a two 40 oz beers a day, along with a four  loko for the last 5-6 years. Last ETOH drink was 7pm yesterday. Denies withdrawals, seizures in the past. Pt is interested in stopping, feels that he can do it on his own but would like resources. He did not know  how important it was so reduce EOTH intake. He said he thinks he can quit easily. -CIWA with Ativan as needed -CSW consult: substance abuse resources  Health Maintenance  Poor Health Literacy  Patient reports not seeing a PCP in about 20 years.  Is behind on all health maintenance.  Will consult social work for PCP needs.  Additionally, appears to have very poor health literacy as he felt his right toe wound may have occurred from stepping wrong/from a strain. Also did not understand the importance of diabetes control or medication adherence.  -Recommend outpatient colonoscopy -Follow-up health screening labs listed above  FEN/GI: Heart healthy/carb modified Prophylaxis: Holding for now until MRI results  Disposition: Med-surg  History of Present Illness:  Jesse [name] is a 60 y.o. male presenting with lower extremity swelling.  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 He thinks he "strained his legs" while working 2 weeks ago. His legs have swollen up. He said he thought something may have bitten him. He also bumped into a stick which cut into his skin 1 week ago. His swelling was worse 2 weeks ago and has got better recently. At home he has tried epsom salts and warm water.   Right big toe became infected 2 weeks ago, draining purulent material. He has been putting peroxide on it and using thin socks. It does not hurt. Does not feel like he has gained weight. He denies swelling elsewhere. Denies SOB or fevers. Does endorse a non productive  cough where he is around dust. He finds it difficult to walk due to LE edema. Denies recent falls. Does endorse tingling at the bottom of his feet.   Previously admitted to the hospital 2 years ago for dental abscess. Denies hx of osteomyelitis.  Denies smoking. Drinks beer 40 x 2 a day. No illicit drug use. No history of surgeries. Denies covid vaccines.    ED Course: Hypertensive to 188 systolic, tachypnic to 30. Given Lasix 40 mg IV x1. Blood cultures collected  prior to antibiotics. Vancomycin started. CBC    Review Of Systems: Per HPI with the following additions:   Review of Systems  Constitutional: Negative for fever and unexpected weight change.  Respiratory: Positive for cough. Negative for shortness of breath.   Cardiovascular: Positive for leg swelling. Negative for chest pain.  Gastrointestinal: Negative for abdominal pain, blood in stool, diarrhea, nausea and vomiting.  Genitourinary: Negative for difficulty urinating.  Musculoskeletal: Positive for gait problem.  Neurological: Negative for dizziness, seizures, weakness and headaches.     Patient Active Problem List   Diagnosis Date Noted  . Hypokalemia 01/28/2014  . DM (diabetes mellitus), type 2, uncontrolled (HCC) 01/27/2014  . AKI (acute kidney injury) (HCC) 01/27/2014  . Leukocytosis, unspecified 01/27/2014  . Facial cellulitis 01/26/2014  . Dental abscess 01/26/2014  . Infected dental carries 01/26/2014  . Cellulitis and abscess 01/26/2014    Past Medical History: History reviewed. No pertinent past medical history.  Past Surgical History: History reviewed. No pertinent surgical history.  Social History: Social History   Tobacco Use  . Smoking status: Never Smoker  . Smokeless tobacco: Never Used  Substance Use Topics  . Alcohol use: Yes    Alcohol/week: 2.0 standard drinks    Types: 2 Cans  of beer per week    Comment: Occasional  . Drug use: No   Additional social history: Lives alone.  Please also refer to relevant sections of EMR.  Family History: Denies FHx.   Allergies and Medications: No Known Allergies No current facility-administered medications on file prior to encounter.   Current Outpatient Medications on File Prior to Encounter  Medication Sig Dispense Refill  . metFORMIN (GLUCOPHAGE) 500 MG tablet Take 1 tablet (500 mg total) by mouth 2 (two) times daily with a meal. (Patient not taking: Reported on 02/07/2021) 60 tablet 1  . predniSONE  (DELTASONE) 20 MG tablet 3 tabs po daily x 3 days, then 2 tabs x 3 days, then 1.5 tabs x 3 days, then 1 tab x 3 days, then 0.5 tabs x 3 days (Patient not taking: Reported on 02/07/2021) 27 tablet 0  . [DISCONTINUED] glipiZIDE (GLUCOTROL) 5 MG tablet Take 1 tablet (5 mg total) by mouth daily before breakfast. (Patient not taking: Reported on 01/14/2016) 30 tablet 0    Objective: BP (!) 164/93 (BP Location: Right Arm)   Pulse 83   Temp 98.1 F (36.7 C)   Resp (!) 22   SpO2 97%   Exam: General: Awake, alert, in no distress, pleasant Eyes: EOMI, anicteric ENTM: Poor dentition, MMM Neck: Supple Cardiovascular: RRR without murmur, 2+ radial pulses, unable to palpate distal lower extremity pulses (likely 2/2 edema) Respiratory: CTAB without wheezing/rhonchi/rales Gastrointestinal: mildly distended, non-tender in all quadrants, no rebound or guarding, no organomegaly appreciated MSK: No obvious joint or bony deformities  Derm: Excoriations and open wounds on b/l extremities, 3-4+ pitting edema to b/l lower extremities from foot to distal thigh, right great toe with posterior non-healing ulceration (see below)  Neuro: Alert and oriented, answers questions appropriately, follows commands, no tremor, moving all extremities spontaneously Psych: Appropriate mood and affect             Labs and Imaging: CBC BMET  Recent Labs  Lab 02/07/21 1210  WBC 8.0  HGB 13.2  HCT 39.3  PLT 245   Recent Labs  Lab 02/07/21 1210  NA 135  K 4.1  CL 102  CO2 25  BUN 13  CREATININE 1.14  GLUCOSE 251*  CALCIUM 9.3     EKG: Pending.  DG Chest Portable 1 View  Result Date: 02/07/2021 CLINICAL DATA:  Bilateral leg swelling EXAM: PORTABLE CHEST 1 VIEW COMPARISON:  January 17, 2021 FINDINGS: The heart size and mediastinal contours are mildly enlarged. There is prominence of the central pulmonary vasculature. There is stable elevation of the right hemidiaphragm. No pleural effusion is seen. No acute  osseous abnormality. IMPRESSION: Mild pulmonary vascular congestion. Electronically Signed   By: Jonna Clark M.D.   On: 02/07/2021 14:51   DG Foot 2 Views Right  Result Date: 02/07/2021 CLINICAL DATA:  Foot pain leg swelling EXAM: RIGHT FOOT - 2 VIEW COMPARISON:  None. FINDINGS: There is no evidence of fracture or dislocation. 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 There is diffuse dorsal soft tissue swelling seen. Calcaneal enthesophyte is noted. IMPRESSION: No acute osseous abnormality. Significant dorsal soft tissue swelling. Electronically Signed   By: Jonna Clark M.D.   On: 02/07/2021 14:52    Sabino Dick, DO 02/07/2021, 5:20 PM PGY-1, [facility] FPTS Intern pager: [phone], text pages welcome

## 2021-02-07 NOTE — Hospital Course (Addendum)
Jesse [name] is a 60 y.o.male who presented with lower extremity swelling. PMH is significant for uncontrolled T2DM and alcohol abuse.   Bilateral Leg Swelling In ED, hypertensive up to 180's systolic but afebrile and otherwise stable. CBC and CMP unremarkable. No leukocytosis or concern for sepsis. BNP 8.9. CXR with mild pulmonary vascular congestion. Exam with 3-4+ edema in b/l lower extremities. No crackles or JVD. Blood cultures collected and ultimately showed no growth 2 days. Given IV Lasix 40 mg x1 with good UOP. Given hx of alcohol abuse, RUQ U/S collected and showed hepatic steatosis without evidence of cirrhosis. Echo unremarkable. DVT U/S negative for DVT but did show enlarged lymph does in groin. ABI showed non-compressible right and left lower extremity arteries, unable to obtain TBI due to his toe size and ulceration. Vascular consulted as a result and recommended right great toe amputation, however patient declined. Patient discharged with wound care teaching and home health wound care service.   Right forefoot cellulitis  Initially treated with IV abx as there was concern for possible osteomyelitis. Received IV vanc, flagyl and cefepime until HOD#2. MRI did not show any evidence of osteomyelitis so IV abx d/c and patient transitioned to oral doxycycline for MRSA coverage (given hx of diabetes and recent Augmentin use <1 month ago). Patient instructed to finish 7-day course.  Hepatic Steatosis Seen on RUQ U/S. Lifestyle modifications encouraged, will need ongoing support and education about this.   Uncontrolled Type 2 Diabetes Mellitus Hgb A1c 10.5. Started on sliding scale insulin and 5U Lantus while inpatient. This was eventually titrated up to 10 units. Diabetes educator consulted during hospitalization. Discharged on 10 units Lantus.   Hypertension Patient with elevated BP's throughout admission, as high as 180's systolic. Started on Losartan 25 mg for renal protection. Will  need BP follow up.   Hyperlipidemia Lipid panel with elevated cholesterol at 223, LDL 152. Not on a statin but should be for primary prevention. ASCVD risk score 23.2% due to being african [place], with untreated HTN, elevated cholesterol, T2DM, male sex. Started on Atorvastatin 40 mg during hospitalization for primary prevention.   Alcohol Abuse Patient with a history of drinking two 40 oz beers along with a four loko daily. CIWA's monitored during admission. Did not require Ativan PRN. Substance abuse resources provided by CSW.

## 2021-02-07 NOTE — Progress Notes (Addendum)
Pharmacy Antibiotic Note  Jesse [name] is a 60 y.o. male admitted on 02/07/2021 with wound infection with concern for early osteo of great right toe.  Pharmacy has been consulted for vancomycin and cefepime dosing. Scr 1.14 (BL ~1-1.2).  Plan: Vancomycin 2000 mg IV x1, followed by 1500 mg IV every 24 hours (eAUC 421, goal AUC 400-550) Cefepime 2g IV every 8 hours Monitor renal function, C&S, clinical status, vanc levels as indicated  Height: 5\' 11"  (180.3 cm) Weight: 106.6 kg (235 lb 0.2 oz) IBW/kg (Calculated) : 75.3  Temp (24hrs), Avg:98.3 F (36.8 C), Min:98.1 F (36.7 C), Max:98.4 F (36.9 C)  Recent Labs  Lab 02/07/21 1210 02/07/21 1420 02/07/21 1551  WBC 8.0  --   --   CREATININE 1.14  --   --   LATICACIDVEN  --  1.7 1.4    Estimated Creatinine Clearance: 87.7 mL/min (by C-G formula based on SCr of 1.14 mg/dL).    No Known Allergies  Antimicrobials this admission: Vancomycin 3/30> Cefepime 3/30>  Dose adjustments this admission: N/A  Microbiology results: 3/30 Bcx: 3/30 Ucx:  Thank you for allowing pharmacy to be a part of this patient's care.  4/30, PharmD PGY1 Acute Care Pharmacy Resident 02/07/2021 6:41 PM  Please check [email or web address] for unit specific pharmacy phone numbers.

## 2021-02-07 NOTE — ED Notes (Signed)
Pt in MRI. Not available for assessment

## 2021-02-07 NOTE — Clinical Note (Incomplete)
P 

## 2021-02-07 NOTE — ED Notes (Signed)
Patient transported to MRI 

## 2021-02-08 ENCOUNTER — Observation Stay (HOSPITAL_COMMUNITY): Payer: Self-pay

## 2021-02-08 ENCOUNTER — Encounter (HOSPITAL_COMMUNITY): Payer: Self-pay | Admitting: Family Medicine

## 2021-02-08 ENCOUNTER — Observation Stay (HOSPITAL_BASED_OUTPATIENT_CLINIC_OR_DEPARTMENT_OTHER): Payer: Self-pay

## 2021-02-08 DIAGNOSIS — L0390 Cellulitis, unspecified: Secondary | ICD-10-CM

## 2021-02-08 DIAGNOSIS — M7989 Other specified soft tissue disorders: Secondary | ICD-10-CM

## 2021-02-08 DIAGNOSIS — R0600 Dyspnea, unspecified: Secondary | ICD-10-CM

## 2021-02-08 DIAGNOSIS — F1010 Alcohol abuse, uncomplicated: Secondary | ICD-10-CM

## 2021-02-08 DIAGNOSIS — L97511 Non-pressure chronic ulcer of other part of right foot limited to breakdown of skin: Secondary | ICD-10-CM

## 2021-02-08 DIAGNOSIS — E11621 Type 2 diabetes mellitus with foot ulcer: Secondary | ICD-10-CM

## 2021-02-08 DIAGNOSIS — R600 Localized edema: Secondary | ICD-10-CM

## 2021-02-08 LAB — LIPID PANEL
Cholesterol: 223 mg/dL — ABNORMAL HIGH (ref 0–200)
HDL: 45 mg/dL (ref >40)
LDL Cholesterol: 152 mg/dL — ABNORMAL HIGH (ref 0–99)
Total CHOL/HDL Ratio: 5 RATIO
Triglycerides: 131 mg/dL (ref <150)
VLDL: 26 mg/dL (ref 0–40)

## 2021-02-08 LAB — GLUCOSE, CAPILLARY
Glucose-Capillary: 204 mg/dL — ABNORMAL HIGH (ref 70–99)
Glucose-Capillary: 198 mg/dL — ABNORMAL HIGH (ref 70–99)
Glucose-Capillary: 185 mg/dL — ABNORMAL HIGH (ref 70–99)
Glucose-Capillary: 176 mg/dL — ABNORMAL HIGH (ref 70–99)

## 2021-02-08 LAB — CBC
HCT: 37.3 % — ABNORMAL LOW (ref 39.0–52.0)
Hemoglobin: 12.8 g/dL — ABNORMAL LOW (ref 13.0–17.0)
MCH: 32.5 pg (ref 26.0–34.0)
MCHC: 34.3 g/dL (ref 30.0–36.0)
MCV: 94.7 fL (ref 80.0–100.0)
Platelets: 217 10*3/uL (ref 150–400)
RBC: 3.94 MIL/uL — ABNORMAL LOW (ref 4.22–5.81)
RDW: 12.4 % (ref 11.5–15.5)
WBC: 7 10*3/uL (ref 4.0–10.5)
nRBC: 0 % (ref 0.0–0.2)

## 2021-02-08 LAB — BASIC METABOLIC PANEL
Anion gap: 7 (ref 5–15)
BUN: 14 mg/dL (ref 6–20)
CO2: 25 mmol/L (ref 22–32)
Calcium: 9.1 mg/dL (ref 8.9–10.3)
Chloride: 103 mmol/L (ref 98–111)
Creatinine, Ser: 1.29 mg/dL — ABNORMAL HIGH (ref 0.61–1.24)
GFR, Estimated: >60 mL/min (ref >60)
Glucose, Bld: 200 mg/dL — ABNORMAL HIGH (ref 70–99)
Potassium: 3.9 mmol/L (ref 3.5–5.1)
Sodium: 135 mmol/L (ref 135–145)

## 2021-02-08 LAB — HIV ANTIBODY (ROUTINE TESTING W REFLEX): HIV Screen 4th Generation wRfx: NONREACTIVE

## 2021-02-08 LAB — MAGNESIUM: Magnesium: 1.4 mg/dL — ABNORMAL LOW (ref 1.7–2.4)

## 2021-02-08 LAB — ECHOCARDIOGRAM COMPLETE
Area-P 1/2: 3.17 cm2
Height: 71 in
S' Lateral: 3.3 cm
Weight: 4001.6 oz

## 2021-02-08 LAB — PHOSPHORUS: Phosphorus: 4 mg/dL (ref 2.5–4.6)

## 2021-02-08 LAB — TSH: TSH: 4.038 u[IU]/mL (ref 0.350–4.500)

## 2021-02-08 MED ORDER — MAGNESIUM SULFATE 2 GM/50ML IV SOLN
2.0000 g | Freq: Once | INTRAVENOUS | Status: AC
  Administered 2021-02-08: 2 g via INTRAVENOUS
  Filled 2021-02-08: qty 50

## 2021-02-08 MED ORDER — LIVING WELL WITH DIABETES BOOK
Freq: Once | Status: AC
  Filled 2021-02-08: qty 1

## 2021-02-08 MED ORDER — ACETAMINOPHEN 325 MG PO TABS: 650.0000 mg | ORAL_TABLET | Freq: Four times a day (QID) | ORAL | Status: DC | PRN

## 2021-02-08 MED ORDER — DOXYCYCLINE HYCLATE 100 MG PO TABS
100.0000 mg | ORAL_TABLET | Freq: Two times a day (BID) | ORAL | Status: DC
  Administered 2021-02-08 – 2021-02-09 (×3): 100 mg via ORAL
  Filled 2021-02-08 (×3): qty 1

## 2021-02-08 MED ORDER — ATORVASTATIN CALCIUM 40 MG PO TABS
40.0000 mg | ORAL_TABLET | Freq: Every day | ORAL | Status: DC
Start: 2021-02-08 — End: 2021-02-10
  Administered 2021-02-08 – 2021-02-09 (×2): 40 mg via ORAL
  Filled 2021-02-08 (×2): qty 1

## 2021-02-08 MED ORDER — ROSUVASTATIN CALCIUM 20 MG PO TABS
20.0000 mg | ORAL_TABLET | Freq: Every day | ORAL | Status: DC
  Administered 2021-02-08: 20 mg via ORAL
  Filled 2021-02-08: qty 1

## 2021-02-08 MED ORDER — AMLODIPINE BESYLATE 5 MG PO TABS
5.0000 mg | ORAL_TABLET | Freq: Every day | ORAL | Status: DC
  Administered 2021-02-08: 5 mg via ORAL
  Filled 2021-02-08: qty 1

## 2021-02-08 NOTE — Consult Note (Signed)
WOC Nurse Consult Note: Patient receiving care in [facility] 5C13. Reason for Consult: diabetic infection of toe, excoriations bilateral shins Wound type: Diabetic right great toe wound, worsened since patient last seen in our ED on 01/17/21.  Also, highly probable untreated venous insufficiency causing weeping and changes to BLE. Pressure Injury POA: Yes/No/NA Measurement: Wound bed: Drainage (amount, consistency, odor)  Periwound: Dressing procedure/placement/frequency:   Wash legs with soap and water. Pat dry. Apply Sween Moisturizing Ointment. Place Xeroform gauze over the wounds on the LEs.  Beginning behind the toes and going to just below the knees, spiral wrap kerlex, then 4 inch ace wrap. Perform daily.  Once ABIs have been obtained, and DVTs ruled out, unna boots may be able to be used to control BLE swelling and healing of scattered ulcerations. Helmut Muster, RN, MSN, CWOCN, CNS-BC, pager [phone]

## 2021-02-08 NOTE — Evaluation (Signed)
Occupational Therapy Evaluation Patient Details Name: Jesse [name] MRN: [id number] DOB: Jul 24, 1961 Today's Date: 02/08/2021    History of Present Illness KAMIR SELOVER is a 60 y.o.male who presented with lower extremity swelling on 3/30. Work up underway.  Dx thus far Rt forefoot cellulitis PMH is significant for uncontrolled T2DM and alcohol abuse.   Clinical Impression   Patient evaluated by Occupational Therapy with no further acute OT needs identified. All education has been completed and the patient has no further questions. Pt is able to perform ADLs and functional mobility mod I.   See below for any follow-up Occupational Therapy or equipment needs. OT is signing off. Thank you for this referral.      Follow Up Recommendations  No OT follow up    Equipment Recommendations  None recommended by OT    Recommendations for Other Services       Precautions / Restrictions Precautions Precautions: None Restrictions Weight Bearing Restrictions: No      Mobility Bed Mobility Overal bed mobility: Independent             General bed mobility comments: pt found standing in room with OT.    Transfers Overall transfer level: Modified independent Equipment used: None                  Balance Overall balance assessment: Mild deficits observed, not formally tested                                         ADL either performed or assessed with clinical judgement   ADL Overall ADL's : Modified independent                                       General ADL Comments: Pt is able to simulate tub transfer and showering in standing mod I.   Performed grooming standing at sink     Vision Patient Visual Report: No change from baseline       Perception     Praxis      Pertinent Vitals/Pain Pain Assessment: 0-10 Pain Score: 3  Pain Location: R toe Pain Descriptors / Indicators: Guarding Pain Intervention(s): Monitored during  session     Hand Dominance Right   Extremity/Trunk Assessment Upper Extremity Assessment Upper Extremity Assessment: Overall WFL for tasks assessed   Lower Extremity Assessment Lower Extremity Assessment: Defer to PT evaluation   Cervical / Trunk Assessment Cervical / Trunk Assessment: Normal   Communication Communication Communication: No difficulties   Cognition Arousal/Alertness: Awake/alert Behavior During Therapy: WFL for tasks assessed/performed Overall Cognitive Status: Within Functional Limits for tasks assessed                                     General Comments  VSS. performed getting on and off the floor to mimic work environment, no difficulties. 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  Does own grocery shopping and household management independently        OT Problem List: Impaired balance (sitting and/or standing)      OT Treatment/Interventions:      OT Goals(Current goals can be found in the care plan section) Acute Rehab OT Goals Patient Stated Goal: for the swelling to go down and to go home OT Goal Formulation: All assessment and education complete, DC therapy  OT Frequency:     Barriers to D/C:            Co-evaluation              AM-PAC OT "6 Clicks" Daily Activity     Outcome Measure Help from another person eating meals?:  None Help from another person taking care of personal grooming?: None Help from another person toileting, which includes using toliet, bedpan, or urinal?: None Help from another person bathing (including washing, rinsing, drying)?: None Help from another person to put on and taking off regular upper body clothing?: None Help from another person to put on and taking off regular lower body clothing?: None 6 Click Score: 24   End of Session Nurse Communication: Mobility status  Activity Tolerance: Patient tolerated treatment well Patient left: Other (comment) (with PT)  OT Visit Diagnosis: Unsteadiness on feet (R26.81)                Time: 3295-1884 OT Time Calculation (min): 13 min Charges:  OT General Charges $OT Visit: 1 Visit OT Evaluation $OT Eval Low Complexity: 1 Low  Eber Jones., OTR/L Acute Rehabilitation Services Pager [phone] Office [phone]   Jeani Hawking M 02/08/2021, 1:54 PM

## 2021-02-08 NOTE — Progress Notes (Signed)
VASCULAR LAB    ABIs have been performed.  See CV proc for preliminary results.   Karletta Millay, RVT 02/08/2021, 8:59 AM

## 2021-02-08 NOTE — Progress Notes (Signed)
VASCULAR LAB    Bilateral lower extremity venous duplex has been performed.  See CV proc for preliminary results.   Johnnay Pleitez, RVT 02/08/2021, 8:59 AM

## 2021-02-08 NOTE — Progress Notes (Signed)
Inpatient Diabetes Program Recommendations  AACE/ADA: New Consensus Statement on Inpatient Glycemic Control   Target Ranges:  Prepandial:   less than 140 mg/dL      Peak postprandial:   less than 180 mg/dL (1-2 hours)      Critically ill patients:  140 - 180 mg/dL   Results for ILARIO, DHALIWAL (MRN [id number]) as of 02/08/2021 08:55  Ref. Range 02/07/2021 21:55 02/08/2021 06:04  Glucose-Capillary Latest Ref Range: 70 - 99 mg/dL 186 (H) 204 (H)  Results for CONTRELL, BALLENTINE (MRN [id number]) as of 02/08/2021 08:55  Ref. Range 02/07/2021 12:10  Glucose Latest Ref Range: 70 - 99 mg/dL 251 (H)   Results for PHILMORE, LEPORE (MRN [id number]) as of 02/08/2021 08:55  Ref. Range 01/27/2014 09:00 02/07/2021 12:10 02/07/2021 21:55  Hemoglobin A1C Latest Ref Range: 4.8 - 5.6 % 10.4 (H) 10.6 (H) 10.5 (H)   Review of Glycemic Control  Diabetes history: DM2 Outpatient Diabetes medications: None Current orders for Inpatient glycemic control: Lantus 5 units daily, Novolog 0-9 units TID with meals, Novolog 0-5 units QHS  Inpatient Diabetes Program Recommendations:    HbgA1C: A1C to 10.5% on 02/07/21 indicating an average glucose of 255 mg/dl over the past 2-3 months.  NOTE: Noted consult for diabetes coordinator for uncontrolled DM. Diabetes coordinator working remotely today.  Per chart review, patient has no insurance or PCP and TOC consulted to assist with medication assistance and follow up. Per H&P, patient reports he was dx with DM 2 years ago and has never taken Metformin and MD notes poor health literacy. Noted patient was inpatient 01/26/2014-01/31/2014 and was newly dx with DM during that admission and seen by inpatient diabetes coordinator on 01/31/2014.  Initial glucose 251 mg/dl on 02/07/21. Noted A1C was 10.4% on 01/27/2014 and current A1C 10.6% on 02/07/21. Ordered Living Well with DM book and RD consult for diet education.  Spoke with patient over the phone about diabetes diagnosis. Patient states that  he was dx with DM 2 years ago he thinks and he was prescribed pills. Patient states that he never started taking the pills because "I did not think diabetes was bad since I didn't have to take a shot so I did not think I needed to take the pills."  Patient states that he did not understand diabetes was important.  Patient reports he has never checked his glucose and he does not have a glucometer at home. Informed patient that he will need to start monitoring glucose and he would be provided with a glucose monitoring kit by our team (will ask coworker to take one to patient today). Will also ask nursing staff to work with patient and educate him on glucose monitoring and allow him to check his own glucose while inpatient.   Discussed A1C results (10.5% on 02/07/21) and explained what an A1C is and informed patient that his current A1C indicates an average glucose of 255 mg/dl over the past 2-3 months. Discussed basic home care, importance of checking CBGs and maintaining good CBG control to prevent long-term and short-term complications. Reviewed glucose and A1C goals. Explained that he will need to take DM medication consistently. Patient asked how long he should take the medication, if he could stop taking it after a month or two. Informed patient that he would need to take it exactly as prescribed every day and that he would need to continue taking it in the future unless his doctor told him otherwise. Explained that if glucose values start  to improve at home, it is likely because of the medications and that the medications would still need to be taken to keep his glucose controlled. Reviewed signs and symptoms of hyperglycemia and hypoglycemia along with treatment for both. Discussed impact of nutrition, exercise, stress, sickness, and medications on diabetes control. Patient does not follow any special diet and drinks mainly Mt Dew and Coke (regular). Asked patient to eliminate sugar from beverages and drink diet  soda, Discussed Carb Modified diet and informed patient that RD is consulted and should be talking with him more about Carb Modified diet. Informed patient that a Living Well with diabetes booklet was ordered and encouraged patient to read through entire book once received.  Asked patient to check his glucose as directed and to keep a log book of glucose readings. Explained how the doctor he follows up with can use the log book to continue to make medication adjustments if needed. Informed patient that TOC has been consulted and should be assisting with follow up and medication needs.  Patient verbalized understanding of information discussed and he states that he has no further questions at this time related to diabetes. Patient will require reinforcement of diabetes education. RNs to provide ongoing basic DM education at bedside with this patient and engage patient to actively check blood glucose.   Thanks, Barnie Alderman, RN, MSN, CDE Diabetes Coordinator Inpatient Diabetes Program [phone] (Team Pager from 8am to 5pm)

## 2021-02-08 NOTE — Progress Notes (Signed)
Pharmacy Antibiotic Note  Jesse [name] is a 60 y.o. male admitted on 02/07/2021 with cellulitis/diabetic foot infection. Pharmacy has been initially consulted for vancomycin and cefepime dosing, though changing today to doxycycline.  Patient has been afebrile, WBC WNL, and is tolerating an oral diet. Patient's RR was initially elevated upon presentation to 30, though now improved to WNL. FM team to start oral antibiotics at this time.  Plan: Discontinue vancomycin. Discontinue cefepime. Start doxycyline 100 mg PO every 12 hours.  Monitor s/sx systemic infection including WBC, and vitals. F/u C&S and LOT.   Height: 5\' 11"  (180.3 cm) Weight: 113.4 kg (250 lb 1.6 oz) IBW/kg (Calculated) : 75.3  Temp (24hrs), Avg:98 F (36.7 C), Min:97.7 F (36.5 C), Max:98.4 F (36.9 C)  Recent Labs  Lab 02/07/21 1210 02/07/21 1420 02/07/21 1551 02/08/21 0538  WBC 8.0  --   --  7.0  CREATININE 1.14  --   --  1.29*  LATICACIDVEN  --  1.7 1.4  --     Estimated Creatinine Clearance: 79.9 mL/min (A) (by C-G formula based on SCr of 1.29 mg/dL (H)).    No Known Allergies  Antimicrobials this admission: 3/30 vancomycin >> 3/31 3/30 cefepime >> 3/31 3/30 metronidazole >> 3/31  Dose adjustments this admission: N/A  Microbiology results: 3/30 BCx: NG <24HRs 3/30 BCx: NG < 24HRs   Thank you for allowing pharmacy to be a part of this patient's care.  4/30, PharmD Student 02/08/2021 11:04 AM

## 2021-02-08 NOTE — Progress Notes (Signed)
  Echocardiogram 2D Echocardiogram has been performed.  Jesse [name] 02/08/2021, 12:15 PM

## 2021-02-08 NOTE — Consult Note (Signed)
[facility] NOTE   MRN : [id number]  Reason for Consult: non healing wounds right GT with B LE edema Referring Physician: Dr. Reinaldo Raddle  History of Present Illness: 60 y/o male with history of HTN and DM.  He states the GT wound started about 3 weeks ago and the LE edema is on/off.  He had some drainage from the GT about 3 days ago.  He denise fever and chills.He does not take medication on a regular basis for HTN or DM.  He does work part time as a Curator.     Current Facility-Administered Medications  Medication Dose Route Frequency Provider Last Rate Last Admin  . acetaminophen (TYLENOL) tablet 650 mg  650 mg Oral Q6H PRN Lilland, Alana, DO      . atorvastatin (LIPITOR) tablet 40 mg  40 mg Oral Daily Paige, Victoria J, DO   40 mg at 02/08/21 1138  . doxycycline (VIBRA-TABS) tablet 100 mg  100 mg Oral Q12H Paige, Victoria J, DO   100 mg at 02/08/21 1138  . folic acid (FOLVITE) tablet 1 mg  1 mg Oral Daily Espinoza, Alejandra, DO   1 mg at 02/08/21 0813  . heparin injection 5,000 Units  5,000 Units Subcutaneous Q8H Lilland, Alana, DO   5,000 Units at 02/08/21 1324  . insulin aspart (novoLOG) injection 0-5 Units  0-5 Units Subcutaneous QHS Espinoza, Alejandra, DO      . insulin aspart (novoLOG) injection 0-9 Units  0-9 Units Subcutaneous TID WC Espinoza, Alejandra, DO   2 Units at 02/08/21 1139  . insulin glargine (LANTUS) injection 5 Units  5 Units Subcutaneous Daily Sabino Dick, DO   5 Units at 02/08/21 0813  . LORazepam (ATIVAN) tablet 1-4 mg  1-4 mg Oral Q1H PRN Sabino Dick, DO       Or  . LORazepam (ATIVAN) injection 1-4 mg  1-4 mg Intravenous Q1H PRN Sabino Dick, DO      . multivitamin with minerals tablet 1 tablet  1 tablet Oral Daily Espinoza, Alejandra, DO   1 tablet at 02/08/21 0813  . thiamine tablet 100 mg  100 mg Oral Daily Espinoza, Alejandra, DO   100 mg at 02/08/21 2751   Or  . thiamine (B-1) injection 100 mg   100 mg Intravenous Daily Espinoza, Alejandra, DO        Pt meds include: Statin :No Betablocker: No ASA: No Other anticoagulants/antiplatelets: none  History reviewed. No pertinent past medical history.  History reviewed. No pertinent surgical history.  Social History Social History   Tobacco Use  . Smoking status: Never Smoker  . Smokeless tobacco: Never Used  Substance Use Topics  . Alcohol use: Yes    Alcohol/week: 2.0 standard drinks    Types: 2 Cans of beer per week    Comment: Occasional  . Drug use: No    Family History History reviewed. No pertinent family history.  No Known Allergies   REVIEW OF SYSTEMS  General: [ ]  Weight loss, [ ]  Fever, [ ]  chills Neurologic: [ ]  Dizziness, [ ]  Blackouts, [ ]  Seizure [ ]  Stroke, [ ]  "Mini stroke", [ ]  Slurred speech, [ ]  Temporary blindness; [ ]  weakness in arms or legs, [ ]  Hoarseness [ ]  Dysphagia Cardiac: [ ]  Chest pain/pressure, [ ]  Shortness of breath at rest [ ]  Shortness of breath with exertion, [ ]  Atrial fibrillation or irregular heartbeat  Vascular: [ ]  Pain in legs with walking, [ ]  Pain in  legs at rest, [ ]  Pain in legs at night,  [ ]  Non-healing ulcer, [ ]  Blood clot in vein/DVT,   Pulmonary: [ ]  Home oxygen, [ ]  Productive cough, [ ]  Coughing up blood, [ ]  Asthma,  [ ]  Wheezing [ ]  COPD Musculoskeletal:  [ ]  Arthritis, [ ]  Low back pain, [ ]  Joint pain Hematologic: [ ]  Easy Bruising, [ ]  Anemia; [ ]  Hepatitis Gastrointestinal: [ ]  Blood in stool, [ ]  Gastroesophageal Reflux/heartburn, Urinary: [ ]  chronic Kidney disease, [ ]  on HD - [ ]  MWF or [ ]  TTHS, [ ]  Burning with urination, [ ]  Difficulty urinating Skin: [ ]  Rashes, [ ]  Wounds Psychological: [ ]  Anxiety, [ ]  Depression  Physical Examination Vitals:   02/07/21 1816 02/07/21 2104 02/08/21 0452 02/08/21 1155  BP:  (!) 168/90 119/82 (!) 154/95  Pulse:  86 86 68  Resp:  20 20 19   Temp:  97.9 F (36.6 C) 97.7 F (36.5 C) 97.6 F (36.4 C)   TempSrc:  Oral Oral Oral  SpO2:  94% 96% 96%  Weight: 106.6 kg  113.4 kg   Height: 5\' 11"  (1.803 m)      Body mass index is 34.88 kg/m.  General:  WDWN in NAD Gait: Normal HENT: WNL Eyes: Pupils equal Pulmonary: normal non-labored breathing , without Rales, rhonchi,  wheezing Cardiac: RRR, without  Murmurs, rubs or gallops; No carotid bruits Abdomen: soft, NT, no masses Skin: no rashes, ulcers noted;  no Gangrene , no cellulitis; no open wounds;   Vascular Exam/Pulses:palpable radial, femoral, DP B LE Brisk DP/PT doppler signals.  Musculoskeletal: no muscle wasting or atrophy; below B knees edema with tight indurated skin.   Small superficial wounds "from scratching        ".    Neurologic: A&O X 3; Appropriate Affect ;  SENSATION: normal; MOTOR FUNCTION: 5/5 Symmetric Speech is fluent/normal   Significant Diagnostic Studies: CBC Lab Results  Component Value Date   WBC 7.0 02/08/2021   HGB 12.8 (L) 02/08/2021   HCT 37.3 (L) 02/08/2021   MCV 94.7 02/08/2021   PLT 217 02/08/2021    BMET    Component Value Date/Time   NA 135 02/08/2021 0538   K 3.9 02/08/2021 0538   CL 103 02/08/2021 0538   CO2 25 02/08/2021 0538   GLUCOSE 200 (H) 02/08/2021 0538   BUN 14 02/08/2021 0538   CREATININE 1.29 (H) 02/08/2021 0538   CALCIUM 9.1 02/08/2021 0538   GFRNONAA >60 02/08/2021 0538   GFRAA >60 01/14/2016 1534   Estimated Creatinine Clearance: 79.9 mL/min (A) (by C-G formula based on SCr of 1.29 mg/dL (H)).  COAG Lab Results  Component Value Date   INR 1.1 01/17/2021     Non-Invasive Vascular Imaging:  ABI Findings:  +---------+------------------+-----+-----------+---------------------------  ----+  Right  Rt Pressure (mmHg)IndexWaveform  Comment               +---------+------------------+-----+-----------+---------------------------  ----+  Brachial 155           multiphasic                   +---------+------------------+-----+-----------+---------------------------  ----+  PTA   180        1.16 multiphasic                  +---------+------------------+-----+-----------+---------------------------  ----+  DP    207        1.34 multiphasic                  +---------+------------------+-----+-----------+---------------------------  ----+  Great Toe                  ulceration on great toe,                             waveform from 2nd toe        +---------+------------------+-----+-----------+---------------------------  ----+   +--------+------------------+-----+-----------+-------+  Left  Lt Pressure (mmHg)IndexWaveform  Comment  +--------+------------------+-----+-----------+-------+  Brachial140           multiphasic      +--------+------------------+-----+-----------+-------+  PTA   189        1.22 multiphasic      +--------+------------------+-----+-----------+-------+  DP   203        1.31 multiphasic      +--------+------------------+-----+-----------+-------+   Unable to obtain TBI secondary to size of toes/ulceration.    Summary:  Right: Resting right ankle-brachial index indicates noncompressible right  lower extremity arteries.   Unable to obtain TBI secondary to toe size/ulceration.  Left: Resting left ankle-brachial index indicates noncompressible left  lower extremity arteries.   Negative DVT study  ASSESSMENT/PLAN:  1 Diabetic right GT ulcer, dry gangrene 2 Chronic bilateral LE edema with possible venous insufficiency Once wounds are healed he may benefit from compression.   3 Negative DVT study 4 ABI with multiphasic flow and elevated index that demonstrates calcified vessels.  Palpable DP pulses B.     WBC WNL on IV antibiotics for GT wound He may need  a right GT amputation  Pending exam and recommendations with DR. Hawken  Control glucose per primary Hypertensive control, statin and ASA.    Mosetta Pigeon 02/08/2021 1:46 PM

## 2021-02-08 NOTE — TOC CAGE-AID Note (Signed)
Transition of Care [facility]) - CAGE-AID Screening   Patient Details  Name: Jesse [name] MRN: [id number] Date of Birth: [birth year]  [facility]) CM/SW Contact:    Jimmy Picket, [place] Phone Number: 02/08/2021, 11:52 AM   Clinical Narrative:  Pt reports 1 can of beer a day and an occasional four loco.CSW and pt discussed the side effects of four loco. Pt stated hes aware and doesn't drink it often. Pt denies having a problem with his alcohol use.  Pt did not need resources at this time.   CAGE-AID Screening:    Have You Ever Felt You Ought to Cut Down on Your Drinking or Drug Use?: No Have People Annoyed You By Critizing Your Drinking Or Drug Use?: No Have You Felt Bad Or Guilty About Your Drinking Or Drug Use?: No Have You Ever Had a Drink or Used Drugs First Thing In The Morning to Steady Your Nerves or to Get Rid of a Hangover?: No CAGE-AID Score: 0  Substance Abuse Education Offered: Yes     Denita Lung, Bridget Hartshorn Clinical Social Worker [phone]

## 2021-02-08 NOTE — Progress Notes (Signed)
RN performed Wound care on the patient and he tolerated it well. BLE wrapped and iodine applied to right great toe and left open to air.

## 2021-02-08 NOTE — Progress Notes (Addendum)
Family Medicine Teaching Service Daily Progress Note Intern Pager: [phone]  Patient name: Jesse [name] Medical record number: [id number] Date of birth: 1961-08-14 Age: 60 y.o. Gender: male  Primary Care Provider: Patient, No Pcp Per (Inactive) Consultants: None Code Status: FULL  Pt Overview and Major Events to Date:  Admitted 3/30  Assessment and Plan: Jesse Mckesson Ingramis a 60 y.o.male who presented with lower extremity swelling. PMH is significant foruncontrolled T2DM and alcohol abuse.   Bilateral Leg Swelling With 900 cc of UOP with one dose of Lasix in ED. RUQ U/S without evidence of cirrhosis but with hepatic steatosis. Weight 106.6 kg > 113.4 kg overnight, question first weight with that significant increase. Additionally, exam with minor improvement in swelling in b/l feet. Will hold off on continued diuresis until echo returns.  -F/u Echo -F/u DVT U/S -F/u ABI; vascular consult if abnormalities  -F/u blood cultures: no growth <24 hours thus far   Right forefoot cellulitis  MRI without evidence of osteomyelitis. Given normal WBC, stable vitals, chronic appearance and imaging now without evidence of osteo, will d/c IV abx. Transition to Doxycycline for treatment of forefoot cellulitis. Will cover for MRSA given patient with recent Augmentin less than a month ago. Will plan for 7-day total course. -D/c Vanc, flagyl, cefepime -Start Doxycycline, (3/31-4/4)  Hepatic Steatosis Seen on RUQ U/S.  -Encourage lifestyle modifications -Encourage alcohol cessation -F/u outpatient   UncontrolledType 2 Diabetes Mellitus Hgb A1c 10.5. CBG's 186-251. Received 3U SSI.  -Diabetes coordinator consult -Start Lantus 5U daily  -sSSI, can increase to moderate scale if still persistently elevated   Elevated BP's  BP's ranging 119-188/82-106. Most recent 119/82. Amlodipine 5mg  x1.  -Switch to Losartan 25 mg tomorrow for renal protection (T2DM) -Monitor BP's  Hyperlipidemia Lipid  panel with elevated cholesterol at 223, LDL 152. Not on a statin but should be for primary prevention. ASCVD risk score 23.2% due to being african , with untreated HTN, elevated cholesterol, T2DM, male sex.  -Start Atorvastatin 40 mg daily   Hypomagnesemia Mg 1.4 this morning. -Replete with 2g mag sulfate  -AM Magnesium  Alcohol Abuse CIWA's 0 overnight.  -TOC consult for substance abuse resources -CIWA q4h with PRN ativan    FEN/GI: Heart healthy/carb modified  PPx: Heparin; can adjust pending imaging results    Status is: Observation  The patient remains OBS appropriate and will d/c before 2 midnights.  Dispo: The patient is from: Home              Anticipated d/c is to: Home              Patient currently is not medically stable to d/c.   Difficult to place patient No    Subjective:  Patient has no complaints this AM. Denies any pain. Feels that he "lost a lot of fluid". Reports about 15 voids since yesterday. Feels that the swelling has gone down since yesterday.   Objective: Temp:  [97.7 F (36.5 C)-98.4 F (36.9 C)] 97.7 F (36.5 C) (03/31 0452) Pulse Rate:  [72-90] 86 (03/31 0452) Resp:  [16-30] 20 (03/31 0452) BP: (119-188)/(82-106) 119/82 (03/31 0452) SpO2:  [94 %-100 %] 96 % (03/31 0452) Weight:  [106.6 kg-113.4 kg] 113.4 kg (03/31 0452) Physical Exam: General: Awake, alert, in no distress, pleasant, cooperative Cardiovascular: RRR without murmur Respiratory: CTAB without wheezing/rhonchi/rales Extremities: 3-4+ pitting edema b/l legs, 2+ pitting edema b/l feet. Able to appreciate 2+ DP pulses b/l lower extremities, chronic wounds to b/l lower extremities  Laboratory: Recent Labs  Lab 02/07/21 1210  WBC 8.0  HGB 13.2  HCT 39.3  PLT 245   Recent Labs  Lab 02/07/21 1210  NA 135  K 4.1  CL 102  CO2 25  BUN 13  CREATININE 1.14  CALCIUM 9.3  PROT 7.6  BILITOT 0.7  ALKPHOS 75  ALT 24  AST 24  GLUCOSE 251*    Imaging/Diagnostic  Tests: MR FOOT RIGHT W WO CONTRAST  Result Date: 02/07/2021 CLINICAL DATA:  Ulcer great toe EXAM: MRI OF THE RIGHT FOREFOOT WITHOUT AND WITH CONTRAST TECHNIQUE: Multiplanar, multisequence MR imaging of the right forefoot was performed before and after the administration of intravenous contrast. CONTRAST:  56mL GADAVIST GADOBUTROL 1 MMOL/ML IV SOLN COMPARISON:  None. FINDINGS: Bones/Joint/Cartilage Distal first phalanx is somewhat limited in evaluation due to technique. No osseous fracture cortical destruction or periosteal reaction is seen. No large joint effusion is noted. Ligaments The Lisfranc and collateral ligaments are intact. Muscles and Tendons Diffusely increased signal is seen within the muscles the forefoot. The flexor and extensor tendons are intact. Soft tissues Area of superficial ulceration seen the dorsum the first digit. There is significant diffuse skin thickening and subcutaneous edema seen along the dorsum of the foot. No loculated fluid collections. IMPRESSION: Somewhat limited evaluation of the distal first phalanx. However no definite evidence of osteomyelitis. Area of superficial ulceration on the dorsum of the first digit with diffuse cellulitis of the forefoot. No soft tissue abscess. Electronically Signed   By: Jonna Clark M.D.   On: 02/07/2021 20:27   DG Chest Portable 1 View  Result Date: 02/07/2021 CLINICAL DATA:  Bilateral leg swelling EXAM: PORTABLE CHEST 1 VIEW COMPARISON:  January 17, 2021 FINDINGS: The heart size and mediastinal contours are mildly enlarged. There is prominence of the central pulmonary vasculature. There is stable elevation of the right hemidiaphragm. No pleural effusion is seen. No acute osseous abnormality. IMPRESSION: Mild pulmonary vascular congestion. Electronically Signed   By: Jonna Clark M.D.   On: 02/07/2021 14:51   DG Foot 2 Views Right  Result Date: 02/07/2021 CLINICAL DATA:  Foot pain leg swelling EXAM: RIGHT FOOT - 2 VIEW COMPARISON:  None.  FINDINGS: There is no evidence of fracture or dislocation. 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 There is diffuse dorsal soft tissue swelling seen. Calcaneal enthesophyte is noted. IMPRESSION: No acute osseous abnormality. Significant dorsal soft tissue swelling. Electronically Signed   By: Jonna Clark M.D.   On: 02/07/2021 14:52   US Abdomen Limited RUQ (LIVER/GB)  Result Date: 02/07/2021 CLINICAL DATA:  History of alcohol use EXAM: ULTRASOUND ABDOMEN LIMITED RIGHT UPPER QUADRANT COMPARISON:  None. FINDINGS: Gallbladder: No gallstones or wall thickening visualized. No sonographic Murphy sign noted by sonographer. Common bile duct: Diameter: 4.5 mm Liver: Increase in echogenicity likely related to fatty infiltration. No focal mass is noted. Portal vein is patent on color Doppler imaging with normal direction of blood flow towards the liver. Other: None. IMPRESSION: Fatty liver. No other focal abnormality is noted. Electronically Signed   By: Alcide Clever M.D.   On: 02/07/2021 20:40     Sabino Dick, DO 02/08/2021, 5:16 AM PGY-1, [facility] FPTS Intern pager: [phone], text pages welcome

## 2021-02-08 NOTE — TOC Initial Note (Signed)
Transition of Care [facility]) - Initial/Assessment Note    Patient Details  Name: Jesse [name] MRN: [id number] Date of Birth: 10-02-1961  [facility] CM/SW Contact:    Kingsley Plan, RN Phone Number: 02/08/2021, 12:50 PM  Clinical Narrative:                 Spoke to patient at bedside.   Patient from home alone, has no DME at home.   Patient does not have a PCP. Discussed [facility]. Patient would like an appointment at [facility] . He lives close to there. NCM scheduled appointment with Gwinda Passe February 26, 2021 at 0930. Information placed on AVS.   NCM will assist patient with discharge prescriptions with [facility] program and [facility] pharmacy. After discharge he can use [facility]  .  Patient voiced understanding   Expected Discharge Plan: Home/Self Care     Patient Goals and CMS Choice Patient states their goals for this hospitalization and ongoing recovery are:: to return to home CMS Medicare.gov Compare Post Acute Care list provided to:: Patient    Expected Discharge Plan and Services Expected Discharge Plan: Home/Self Care In-house Referral: [facility] / [facility] Discharge Planning Services: CM Consult,Indigent Health [facility],Medication Assistance   Living arrangements for the past 2 months: Single Family Home                 DME Arranged: N/A DME Agency: NA       HH Arranged: NA          Prior Living Arrangements/Services Living arrangements for the past 2 months: Single Family Home Lives with:: Self Patient language and need for interpreter reviewed:: Yes Do you feel safe going back to the place where you live?: Yes            Criminal Activity/Legal Involvement Pertinent to Current Situation/Hospitalization: No - Comment as needed  Activities of Daily Living Home Assistive Devices/Equipment: None ADL Screening (condition at time of  admission) Patient's cognitive ability adequate to safely complete daily activities?: Yes Is the patient deaf or have difficulty hearing?: No Does the patient have difficulty seeing, even when wearing glasses/contacts?: No Does the patient have difficulty concentrating, remembering, or making decisions?: No Patient able to express need for assistance with ADLs?: Yes Does the patient have difficulty dressing or bathing?: No Independently performs ADLs?: No Communication: Independent Dressing (OT): Independent Grooming: Independent Feeding: Independent Toileting: Independent In/Out Bed: Independent Walks in Home: Independent Does the patient have difficulty walking or climbing stairs?: No Weakness of Legs: None Weakness of Arms/Hands: None  Permission Sought/Granted   Permission granted to share information with : No              Emotional Assessment Appearance:: Appears stated age Attitude/Demeanor/Rapport: Engaged Affect (typically observed): Accepting Orientation: : Oriented to Self,Oriented to Place,Oriented to  Time,Oriented to Situation Alcohol / Substance Use: Not Applicable Psych Involvement: No (comment)  Admission diagnosis:  Edema [R60.9] Anasarca [R60.1] Alcohol abuse [F10.10] Bilateral leg edema [R60.0] 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 2 diabetes mellitus, limited to breakdown of skin (HCC) [H47.425, L97.511] Patient Active Problem List   Diagnosis Date Noted  . Alcohol abuse   . Cellulitis   . Bilateral leg edema 02/07/2021  . Anasarca   . 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 Hypokalemia 01/28/2014  .  DM (diabetes mellitus), type 2, uncontrolled (HCC) 01/27/2014  . AKI (acute kidney injury) (HCC) 01/27/2014  . Leukocytosis, unspecified 01/27/2014  . Facial cellulitis 01/26/2014  . Dental abscess 01/26/2014  . Infected dental carries 01/26/2014  .  Cellulitis and abscess 01/26/2014   PCP:  Patient, No Pcp Per (Inactive) Pharmacy:   [facility] [phone] - [place], [place] - [address] AT [facility] OF [address] [place] [address] Phone: [phone] Fax: [phone]  [id number] [place], [place] - [address] AT [facility] OF [address] & [address] [place] [address] Phone: [phone] Fax: [phone]  [name] - [place], [place] - [address] [place] [address] Phone: [phone] Fax: [phone]     Social Determinants of Health (SDOH) Interventions    Readmission Risk Interventions No flowsheet data found.

## 2021-02-08 NOTE — Evaluation (Signed)
Physical Therapy Evaluation Patient Details Name: Jesse [name] MRN: [id number] DOB: July 10, 1961 Today's Date: 02/08/2021   History of Present Illness  Jesse [name] is a 60 y.o.male who presented with lower extremity swelling on 3/30. PMH is significant for uncontrolled T2DM and alcohol abuse.  Clinical Impression  Pt fully participated in PT session. Pt states he was I prior to admission without AD. Pt performed functional mobility tasks mod I without AD. Slight deficits in gait noted due to pain in R toe. No further acute PT needs noted.    Follow Up Recommendations No PT follow up    Equipment Recommendations  None recommended by PT    Recommendations for Other Services       Precautions / Restrictions Restrictions Weight Bearing Restrictions: No      Mobility  Bed Mobility               General bed mobility comments: pt found standing in room with OT.    Transfers Overall transfer level: Modified independent Equipment used: None                Ambulation/Gait Ambulation/Gait assistance: Modified independent (Device/Increase time) Gait Distance (Feet): 500 Feet Assistive device: None       General Gait Details: slight forward weight shift with decreased weight shifting onto R LE due to R toe pain. pt given cues to improve posture with improved gait mechanics noted  Stairs            Wheelchair Mobility    Modified Rankin (Stroke Patients Only)       Balance Overall balance assessment: Mild deficits observed, not formally tested                                           Pertinent Vitals/Pain Pain Assessment: 0-10 Pain Score: 3  Pain Location: R toe    Home Living Family/patient expects to be discharged to:: Private residence Living Arrangements: Alone   Type of Home: House Home Access: Stairs to enter Entrance Stairs-Rails: None Entrance Stairs-Number of Steps: 1 Home Layout: One level Home Equipment:  None      Prior Function Level of Independence: Independent         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  Does own grocery shopping and household management independently     Hand Dominance        Extremity/Trunk Assessment   Upper Extremity Assessment Upper Extremity Assessment: Defer to OT evaluation    Lower Extremity Assessment Lower Extremity Assessment: Overall WFL for tasks assessed       Communication   Communication: No difficulties  Cognition Arousal/Alertness: Awake/alert Behavior During Therapy: WFL for tasks assessed/performed Overall Cognitive Status: Within Functional Limits for tasks assessed                                        General Comments General comments (skin integrity, edema, etc.): VSS. performed getting on and off the floor to mimic work environment, no difficulties. therapist demonstrating improved technique to get on and off the floor and pt receptive to education    Exercises  education on HEP performed standing exercises: Marching in place, hip flexion, extension abduction in standing, mini squats. Pt receptive to all exercises and verbalized understanding  Assessment/Plan    PT Assessment Patent does not need any further PT services  PT Problem List         PT Treatment Interventions      PT Goals (Current goals can be found in the Care Plan section)  Acute Rehab PT Goals Patient Stated Goal: to go home PT Goal Formulation: With patient Time For Goal Achievement: 02/08/21 Potential to Achieve Goals: Good    Frequency     Barriers to discharge        Co-evaluation               AM-PAC PT "6 Clicks" Mobility  Outcome Measure Help needed turning from your back to your side while in a flat bed without using bedrails?: None Help needed moving from lying on your back to sitting on the side of a flat bed without using bedrails?: None Help needed moving to and from a bed to a chair  (including a wheelchair)?: None Help needed standing up from a chair using your arms (e.g., wheelchair or bedside chair)?: None Help needed to walk in hospital room?: None Help needed climbing 3-5 steps with a railing? : None 6 Click Score: 24    End of Session Equipment Utilized During Treatment: Gait belt Activity Tolerance: Patient tolerated treatment well Patient left: with call bell/phone within reach;in chair Nurse Communication: Mobility status PT Visit Diagnosis: Other abnormalities of gait and mobility (R26.89)    Time: 0951-1000 PT Time Calculation (min) (ACUTE ONLY): 9 min   Charges:   PT Evaluation $PT Eval Low Complexity: 1 Low          Ginette Otto, DPT Acute Rehabilitation Services [phone]  Lucretia Field 02/08/2021, 10:49 AM

## 2021-02-09 ENCOUNTER — Inpatient Hospital Stay (HOSPITAL_COMMUNITY): Payer: Self-pay

## 2021-02-09 ENCOUNTER — Other Ambulatory Visit: Payer: Self-pay | Admitting: Family Medicine

## 2021-02-09 LAB — CBC
HCT: 37.7 % — ABNORMAL LOW (ref 39.0–52.0)
Hemoglobin: 13 g/dL (ref 13.0–17.0)
MCH: 32.4 pg (ref 26.0–34.0)
MCHC: 34.5 g/dL (ref 30.0–36.0)
MCV: 94 fL (ref 80.0–100.0)
Platelets: 248 10*3/uL (ref 150–400)
RBC: 4.01 MIL/uL — ABNORMAL LOW (ref 4.22–5.81)
RDW: 12.2 % (ref 11.5–15.5)
WBC: 6.6 10*3/uL (ref 4.0–10.5)
nRBC: 0 % (ref 0.0–0.2)

## 2021-02-09 LAB — BASIC METABOLIC PANEL
Anion gap: 8 (ref 5–15)
BUN: 15 mg/dL (ref 6–20)
CO2: 24 mmol/L (ref 22–32)
Calcium: 9.3 mg/dL (ref 8.9–10.3)
Chloride: 102 mmol/L (ref 98–111)
Creatinine, Ser: 1.24 mg/dL (ref 0.61–1.24)
GFR, Estimated: >60 mL/min (ref >60)
Glucose, Bld: 184 mg/dL — ABNORMAL HIGH (ref 70–99)
Potassium: 3.7 mmol/L (ref 3.5–5.1)
Sodium: 134 mmol/L — ABNORMAL LOW (ref 135–145)

## 2021-02-09 LAB — MAGNESIUM: Magnesium: 1.6 mg/dL — ABNORMAL LOW (ref 1.7–2.4)

## 2021-02-09 LAB — FERRITIN: Ferritin: 741 ng/mL — ABNORMAL HIGH (ref 24–336)

## 2021-02-09 LAB — IRON AND TIBC
Iron: 64 ug/dL (ref 45–182)
Saturation Ratios: 23 % (ref 17.9–39.5)
TIBC: 274 ug/dL (ref 250–450)
UIBC: 210 ug/dL

## 2021-02-09 LAB — PROTEIN / CREATININE RATIO, URINE
Creatinine, Urine: 104.96 mg/dL
Protein Creatinine Ratio: 0.18 mg/mg{Cre} — ABNORMAL HIGH (ref 0.00–0.15)
Total Protein, Urine: 19 mg/dL

## 2021-02-09 LAB — GLUCOSE, CAPILLARY
Glucose-Capillary: 221 mg/dL — ABNORMAL HIGH (ref 70–99)
Glucose-Capillary: 216 mg/dL — ABNORMAL HIGH (ref 70–99)
Glucose-Capillary: 228 mg/dL — ABNORMAL HIGH (ref 70–99)

## 2021-02-09 IMAGING — CT CT ABD-PEL WO/W CM
3 of 14 series · 11 of 46 positions shown, 17 images · IV contrast (APPLIED)
Comparison: Right upper quadrant abdominal ultrasound [DATE].

CLINICAL DATA: Hematuria, unknown cause. No pain. Bilateral lower
extremity swelling and inguinal adenopathy.

EXAM:
CT ABDOMEN AND PELVIS WITHOUT AND WITH CONTRAST
TECHNIQUE: Multidetector CT imaging of the abdomen and pelvis was performed
following the standard protocol before and following the bolus
administration of intravenous contrast.
CONTRAST:  150mL OMNIPAQUE IOHEXOL 300 MG/ML  SOLN

[Series 4: pre contrast 1.0 i30f thins · axial · non-contrast · 0.85mm/px · z∈[+973,+1361]mm · 7 of 647 slices shown, 12 images]
[im 81/647  soft-tissue]
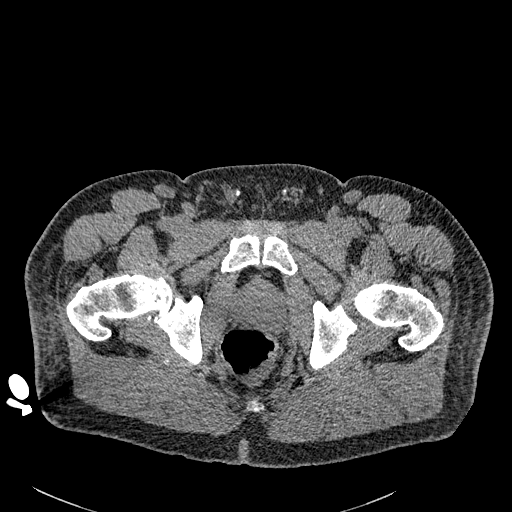
[im 81/647  bone]
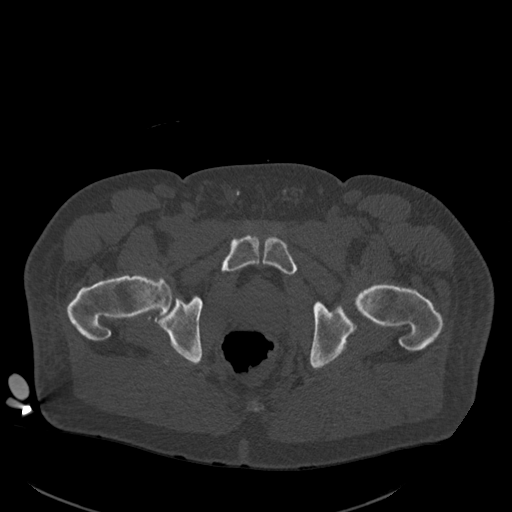
[im 162/647  soft-tissue]
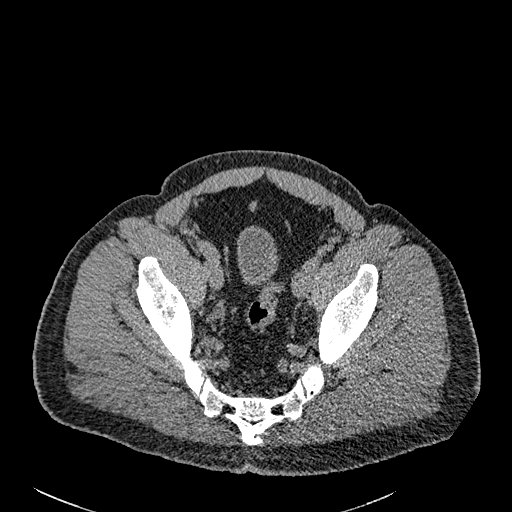
[im 243/647  soft-tissue]
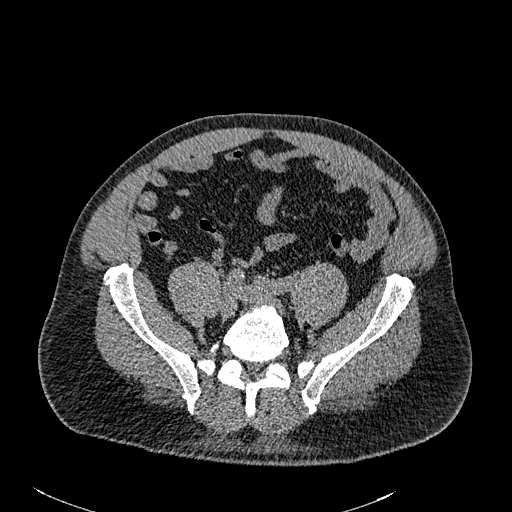
[im 324/647  soft-tissue]
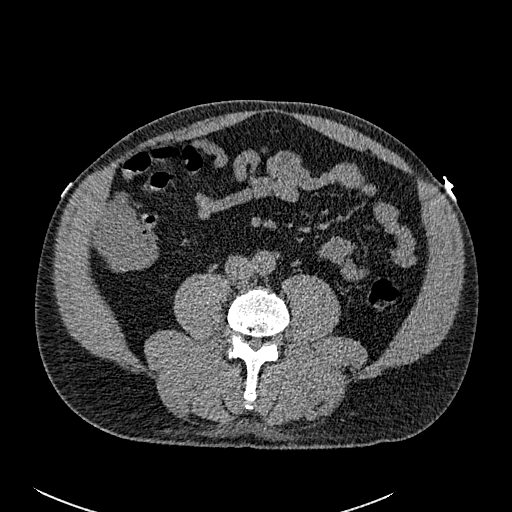
[im 324/647  lung]
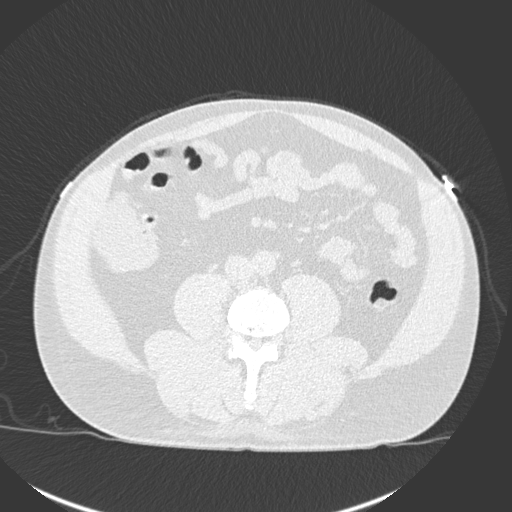
[im 404/647  soft-tissue]
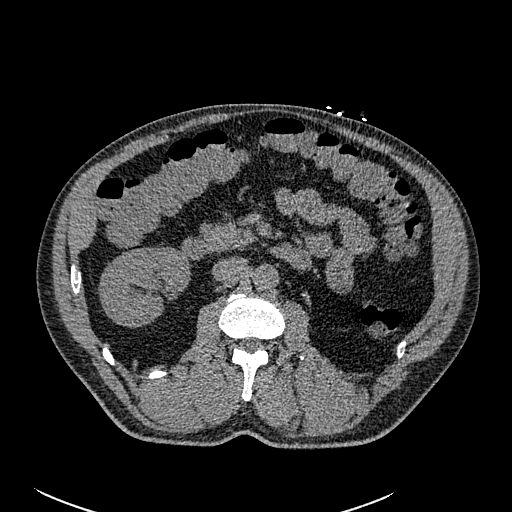
[im 404/647  lung]
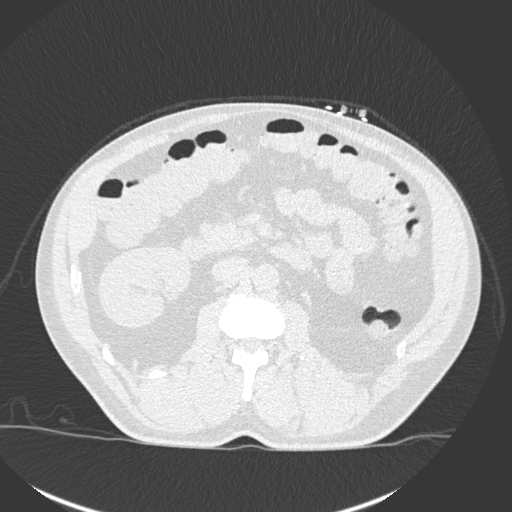
[im 485/647  soft-tissue]
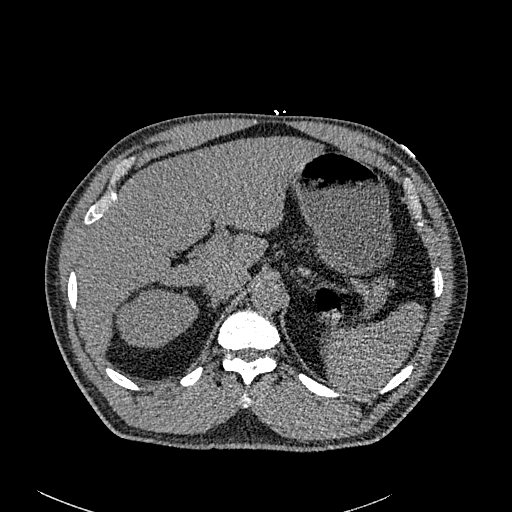
[im 485/647  lung]
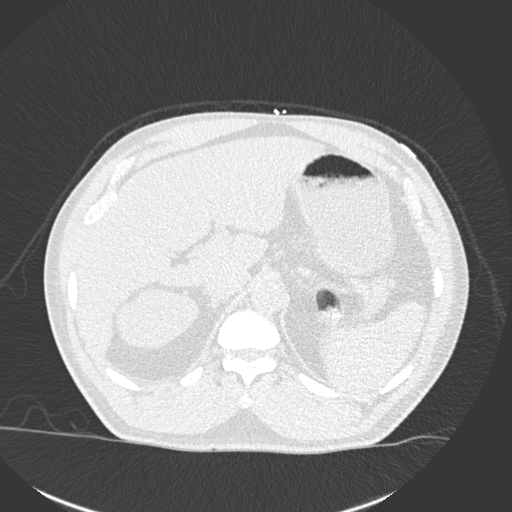
[im 566/647  soft-tissue]
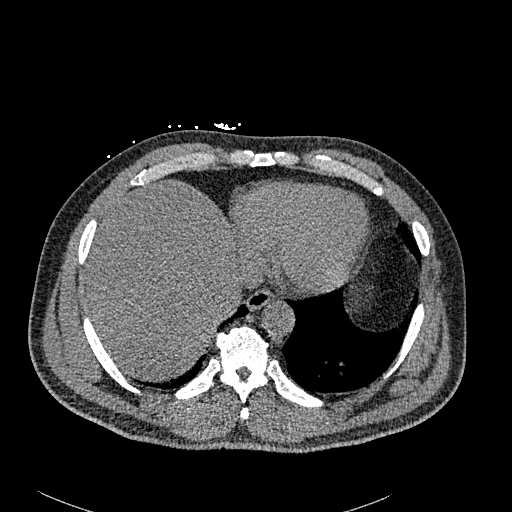
[im 566/647  lung]
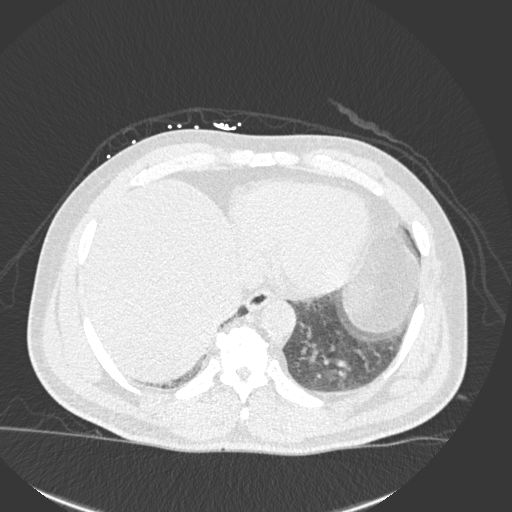

[Series 10: coronal · coronal · 0.89mm/px · 2 of 118 slices shown, 3 images]
[im 40/118  soft-tissue]
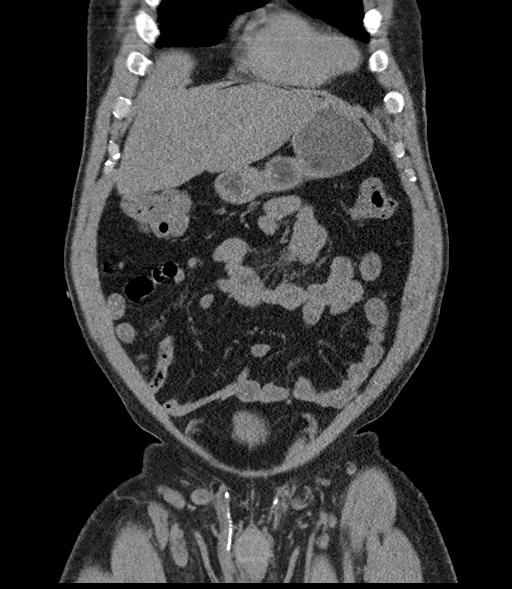
[im 40/118  bone]
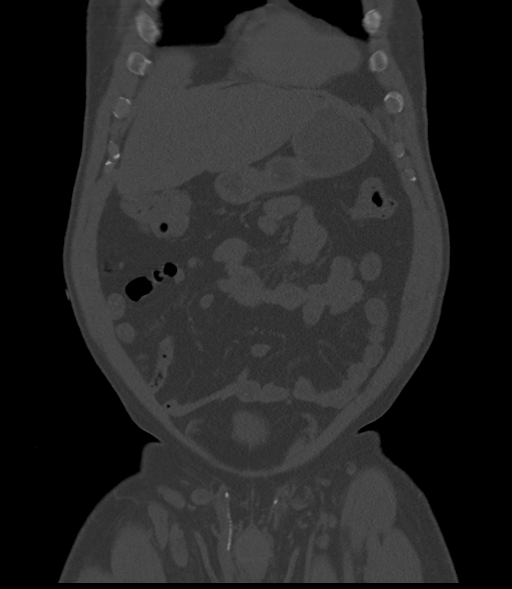
[im 79/118  soft-tissue]
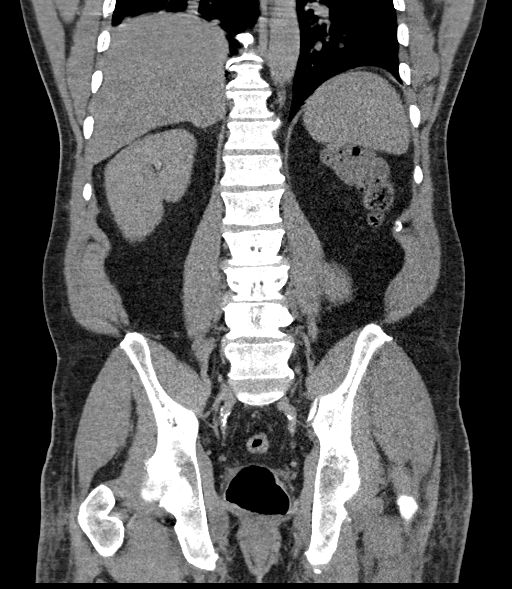

[Series 16: thins · axial · 0.98mm/px · z∈[+903,+969]mm · 2 of 660 slices shown]
[im 83/660  soft-tissue]
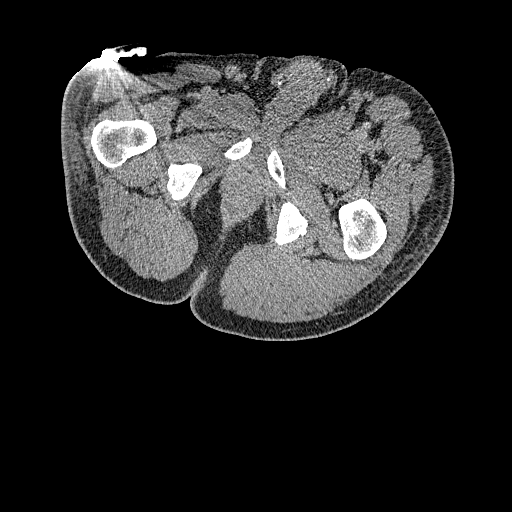
[im 165/660  soft-tissue]
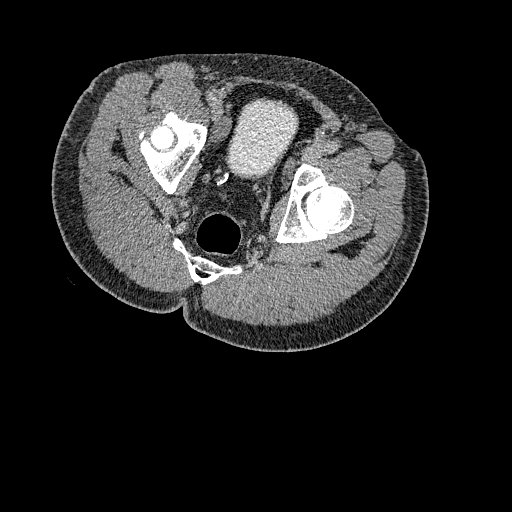

[11 of 46 positions shown; findings below may reference images not displayed]

FINDINGS: The study was interrupted by the patient's need to urinate during
the examination. The images consist of pre contrast and delayed
post-contrast series. There is not a good portal phase study.

Lower chest: Mild right hemidiaphragm elevation with associated
right basilar atelectasis. There is atherosclerosis of the coronary
arteries. No significant pleural or pericardial effusion.

Hepatobiliary: Pre contrast images demonstrate decreased hepatic
density consistent with steatosis. Limited contrast bolus limits
assessment of the liver, but no focal abnormality or abnormal
enhancement identified. The gallbladder is incompletely distended.
No evidence of gallstones, gallbladder wall thickening or biliary
dilatation.

Pancreas: Unremarkable. No pancreatic ductal dilatation or
surrounding inflammatory changes.

Spleen: Normal in size without focal abnormality.

Adrenals/Urinary Tract: Both adrenal glands appear normal. The right
kidney appears unremarkable, without evidence of urinary tract
calculus, mass lesion or abnormal enhancement. There is no
hydronephrosis. There is no functional left renal tissue. There may
be a small amount of dysplastic left renal tissue. The bladder
appears unremarkable.

Stomach/Bowel: No enteric contrast was administered. The stomach
appears unremarkable for its degree of distension. No evidence of
bowel wall thickening, distention or surrounding inflammatory
change. The appendix appears normal.

Vascular/Lymphatic: There are no enlarged abdominal lymph nodes.
There is a mildly enlarged right external iliac node measuring 15 mm
on image 81/15. Small inguinal lymph nodes do not appear
pathologically enlarged. There is mild aortic and branch vessel
atherosclerosis without acute vascular findings.

Reproductive: The prostate gland and seminal vesicles appear normal.
Postsurgical changes in both inguinal canals.

Other: No evidence of abdominal wall mass or hernia. No ascites.

Musculoskeletal: No acute or significant osseous findings.
Multilevel lumbar spondylosis.
IMPRESSION: 1. No acute findings or explanation for hematuria identified. There
is no evidence of urinary tract calculus or hydronephrosis.
2. Probable congenital renal dysplasia on the left without
functional tissue. The right kidney demonstrates mild compensatory
hypertrophy.
3. Hepatic steatosis.
4. Single mildly enlarged right external iliac lymph nodes with
prominent inguinal nodes bilaterally, likely reactive.
5.  Aortic Atherosclerosis ([IX]-[IX]).

## 2021-02-09 MED ORDER — LOSARTAN POTASSIUM 25 MG PO TABS: 25.0000 mg | ORAL_TABLET | Freq: Every day | ORAL | 0 refills | Status: DC

## 2021-02-09 MED ORDER — INSULIN GLARGINE 100 UNIT/ML SOLOSTAR PEN: 10.0000 [IU] | PEN_INJECTOR | Freq: Every day | SUBCUTANEOUS | 0 refills | Status: DC

## 2021-02-09 MED ORDER — INSULIN GLARGINE 100 UNIT/ML ~~LOC~~ SOLN
5.0000 [IU] | Freq: Once | SUBCUTANEOUS | Status: AC
  Administered 2021-02-09: 5 [IU] via SUBCUTANEOUS
  Filled 2021-02-09: qty 0.05

## 2021-02-09 MED ORDER — BLOOD GLUCOSE METER KIT: PACK | 0 refills | Status: DC

## 2021-02-09 MED ORDER — MAGNESIUM SULFATE 2 GM/50ML IV SOLN
2.0000 g | Freq: Once | INTRAVENOUS | Status: AC
  Administered 2021-02-09: 2 g via INTRAVENOUS
  Filled 2021-02-09: qty 50

## 2021-02-09 MED ORDER — INSULIN GLARGINE 100 UNIT/ML ~~LOC~~ SOLN: 10.0000 [IU] | Freq: Every day | SUBCUTANEOUS | Status: DC

## 2021-02-09 MED ORDER — IOHEXOL 300 MG/ML  SOLN
150.0000 mL | Freq: Once | INTRAMUSCULAR | Status: AC | PRN
  Administered 2021-02-09: 150 mL via INTRAVENOUS

## 2021-02-09 MED ORDER — ATORVASTATIN CALCIUM 40 MG PO TABS: 40.0000 mg | ORAL_TABLET | Freq: Every day | ORAL | 0 refills | Status: DC

## 2021-02-09 MED ORDER — ENSURE MAX PROTEIN PO LIQD
11.0000 [oz_av] | Freq: Every day | ORAL | Status: DC
  Administered 2021-02-09: 11 [oz_av] via ORAL
  Filled 2021-02-09: qty 330

## 2021-02-09 MED ORDER — DOXYCYCLINE HYCLATE 100 MG PO TABS: 100.0000 mg | ORAL_TABLET | Freq: Every day | ORAL | 0 refills | Status: AC

## 2021-02-09 MED FILL — LANTUS SOLOSTAR 100 UNITS/M: 100 | 28 days supply | Qty: 3 | Fill #0

## 2021-02-09 MED FILL — TRUEplus LANCETS 28G MISC: 30 days supply | Qty: 100 | Fill #0

## 2021-02-09 MED FILL — LOSARTAN POTASSIUM 25 MG TA: 25 | 30 days supply | Qty: 30 | Fill #0

## 2021-02-09 MED FILL — ATORVASTATIN CALCIUM 40 MG: 40 | 30 days supply | Qty: 30 | Fill #0

## 2021-02-09 MED FILL — TRUE METRIX GLUCOSE TEST ST: 30 days supply | Qty: 100 | Fill #0

## 2021-02-09 MED FILL — PENTIPS 31G X 8 MM MISC: 31G X 8 MM | 30 days supply | Qty: 100 | Fill #0

## 2021-02-09 MED FILL — DOXYCYCLINE HYCLATE 100 MG: 100 | 3 days supply | Qty: 3 | Fill #0

## 2021-02-09 MED FILL — TRUE METRIX BLOOD GLUCOSE M: W/DEVICE | 1 days supply | Qty: 1 | Fill #0

## 2021-02-09 NOTE — Progress Notes (Signed)
Inpatient Diabetes Program Recommendations  AACE/ADA: New Consensus Statement on Inpatient Glycemic Control  Target Ranges:  Prepandial:   less than 140 mg/dL      Peak postprandial:   less than 180 mg/dL (1-2 hours)      Critically ill patients:  140 - 180 mg/dL   Results for JOSELITO, FIELDHOUSE (MRN [id number]) as of 02/09/2021 07:53  Ref. Range 02/08/2021 06:04 02/08/2021 11:06 02/08/2021 16:06 02/08/2021 21:33 02/09/2021 06:11  Glucose-Capillary Latest Ref Range: 70 - 99 mg/dL 466 (H) 599 (H) 357 (H) 176 (H) 221 (H)   Review of Glycemic Control  Diabetes history: DM2 Outpatient Diabetes medications: None Current orders for Inpatient glycemic control: Lantus 5 units daily, Novolog 0-9 units TID with meals, Novolog 0-5 units QHS  Inpatient Diabetes Program Recommendations:    Insulin: Please consider increasing Lantus to 8 units daily and ordering Novolog 2 units TID with meals for meal coverage if patient eats at least 50% of meals.  Thanks, Orlando Penner, RN, MSN, CDE Diabetes Coordinator Inpatient Diabetes Program [phone] (Team Pager from 8am to 5pm)

## 2021-02-09 NOTE — Progress Notes (Signed)
Wound care dressing done this am as per MD orders, but pt refuses ace bandage

## 2021-02-09 NOTE — Progress Notes (Signed)
Family Medicine Teaching Service Daily Progress Note Intern Pager: [phone]  Patient name: Jesse [name] Medical record number: [id number] Date of birth: February 18, 1961 Age: 60 y.o. Gender: male  Primary Care Provider: Patient, No Pcp Per (Inactive) Consultants: None Code Status: Full  Pt Overview and Major Events to Date:  3/30 admitted  Assessment and Plan: [name] is a 60 year old male who presented with BLE swelling found to have right great toe gangrene and right forefoot cellulitis.  PMH includes uncontrolled T2DM and alcohol abuse.  Bilateral Leg Swelling  Right forefoot cellulitis  UOP 700 mL overnight last night.  Weight 113 kg, down marginally from 113.4.  Echo 3/31 unremarkable.  Bilateral lower extremity ultrasound negative for DVT, noticed enlarged lymph nodes in the groin.  Vascular ABI ultrasound demonstrated noncompressible RLE arteries, noncompressible L LE arteries.  Blood cultures no growth 2 days.  S/p Vanco, Flagyl, cefepime. -Continue doxycycline through 4/4 (plan for 7-day total course ABX)  Hepatic Steatosis Seen on RUQ U/S.  -Encourage lifestyle modifications -Encourage alcohol cessation -F/u outpatient   UncontrolledType 2 Diabetes Mellitus CBGs last 24 hours 176-221. -Increase Lantus from 5 units to 10 units  Elevated BP's  BPs remain elevated, systolics 140s-170s. -Start losartan 25 mg  Hyperlipidemia Continue atorvastatin 40 mg daily.  Hypomagnesemia A.m. magnesium 1.6. -Replete with 2 g mag sulfate IVPB  Alcohol Abuse CIWA continue to be 0.  FEN/GI: Heart healthy carb modified PPx: Heparin   Status is: Inpatient  Remains inpatient appropriate because:discharging today   Dispo: The patient is from: Home              Anticipated d/c is to: Home              Patient currently is medically stable to d/c.   Difficult to place patient No   Subjective:  Patient sitting on side of bed with IV nurse.  Patient pleasant and in  good spirits.  Answered all questions.  Discussed medications to go home on.  Plan for discharge this afternoon after CT.  Objective: Temp:  [97.6 F (36.4 C)-98.4 F (36.9 C)] 98.1 F (36.7 C) (04/01 2009) Pulse Rate:  [80-90] 90 (04/01 2009) Resp:  [18-19] 18 (04/01 2009) BP: (137-173)/(84-105) 159/105 (04/01 2009) SpO2:  [95 %-98 %] 97 % (04/01 2009) Weight:  [937 kg] 113 kg (04/01 0446) Physical Exam: General: Awake, alert, oriented, no acute distress Cardiovascular: RRR, no murmur Respiratory: CTA B Extremities: 3+ pitting edema to knee  Laboratory: Recent Labs  Lab 02/07/21 1210 02/08/21 0538 02/09/21 0453  WBC 8.0 7.0 6.6  HGB 13.2 12.8* 13.0  HCT 39.3 37.3* 37.7*  PLT 245 217 248   Recent Labs  Lab 02/07/21 1210 02/08/21 0538 02/09/21 0453  NA 135 135 134*  K 4.1 3.9 3.7  CL 102 103 102  CO2 25 25 24   BUN 13 14 15   CREATININE 1.14 1.29* 1.24  CALCIUM 9.3 9.1 9.3  PROT 7.6  --   --   BILITOT 0.7  --   --   ALKPHOS 75  --   --   ALT 24  --   --   AST 24  --   --   GLUCOSE 251* 200* 184*    Imaging/Diagnostic Tests: CT ABDOMEN AND PELVIS WITHOUT AND WITH CONTRAST 02/09/2021 1. No acute findings or explanation for hematuria identified. There is no evidence of urinary tract calculus or hydronephrosis. 2. 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 The right kidney  demonstrates mild compensatory hypertrophy. 3. Hepatic steatosis. 4. Single mildly enlarged right external iliac lymph nodes with prominent inguinal nodes bilaterally, likely reactive. 5.  Aortic Atherosclerosis (ICD10-I70.0).  Fayette Pho, MD 02/09/2021, 8:34 PM PGY-1, [facility] Family Medicine FPTS Intern pager: [phone], text pages welcome

## 2021-02-09 NOTE — Discharge Instructions (Signed)
Dear Jesse [name],  Thank you for letting us participate in your care. You were hospitalized for swelling of your legs and right large toe gangrene (infection). You were treated with antibiotics.   POST-HOSPITAL & CARE INSTRUCTIONS 1. Inject 10 units of Lantus daily. This is your long-acting insulin and will help keep your baseline blood sugar managed for a whole 24 hours per shot.  2. Take your Losartan 25 mg tablet each day as instructed. This is a blood pressure medicine that also protects kidneys in diabetic patients.  3. You will establish with the internal medicine clinic listed below for your regular primary health care. They will help you manage your diabetes and blood pressure. Your first appointment is on April 18th, the time and details are below.  4. Manage your wounds and dressings as you've been taught by the nurses. If you have any questions, the doctor at your hospital follow up appointment on Friday 4/8 will be able to help.  5. Go to your follow up appointments (listed below)   DOCTOR'S APPOINTMENT   Future Appointments  Date Time Provider Department Center  02/16/2021 10:10 AM Allayne Stack, DO [facility]  02/26/2021  9:30 AM Grayce Sessions, NP [facility] None    Follow-up Information    Grayce Sessions, NP Follow up.   Specialty: Internal Medicine Why: February 26, 2021 at 0930 am  Contact information: [address] [place] [address] [phone]        [name]. Go on 02/16/2021.   Specialty: Family Medicine Why: @10 :10am. This is a hospital follow up appointment with Dr. , one of the physicians whom you saw in the hospital. This is meant to bridge you until you establish care at the internal medicine clinic on 02/26/21.  Contact information: [address] [place] [phone]              Take care and be well!  Family Medicine Teaching Service  Inpatient Team [place]  [facility]  [address] [place], [place] [phone]       Wound Care, Adult Taking care of your wound properly can help to prevent pain, infection, and scarring. It can also help your wound heal more quickly. Follow instructions from your health care provider about how to care for your wound. Supplies needed:  Soap and water.  Wound cleanser.  Gauze.  If needed, a clean bandage (dressing) or other type of wound dressing material to cover or place in the wound. Follow your health care provider's instructions about what dressing supplies to use.  Cream or ointment to apply to the wound, if told by your health care provider. How to care for your wound Cleaning the wound Ask your health care provider how to clean the wound. This may include:  Using mild soap and water or a wound cleanser.  Using a clean gauze to pat the wound dry after cleaning it. Do not rub or scrub the wound. Dressing care  Wash your hands with soap and water for at least 20 seconds before and after you change the dressing. If soap and water are not available, use hand sanitizer.  Change your dressing as told by your health care provider. This may include: ? Cleaning or rinsing out (irrigating) the wound. ? Placing a dressing over the wound or in the wound (packing). ? Covering the wound with an outer dressing.  Leave any  stitches (sutures), skin glue, or adhesive strips in place. These skin closures may need to stay in place for 2 weeks or longer. If adhesive strip edges start to loosen and curl up, you may trim the loose edges. Do not remove adhesive strips completely unless your health care provider tells you to do that.  Ask your health care provider when you can leave the wound uncovered. Checking for infection Check your wound area every day for signs of infection. Check for:  More redness, swelling, or pain.  Fluid or  blood.  Warmth.  Pus or a bad smell.   Follow these instructions at home Medicines  If you were prescribed an antibiotic medicine, cream, or ointment, take or apply it as told by your health care provider. Do not stop using the antibiotic even if your condition improves.  If you were prescribed pain medicine, take it 30 minutes before you do any wound care or as told by your health care provider.  Take over-the-counter and prescription medicines only as told by your health care provider. Eating and drinking  Eat a diet that includes protein, vitamin A, vitamin C, and other nutrient-rich foods to help the wound heal. ? Foods rich in protein include meat, fish, eggs, dairy, beans, and nuts. ? Foods rich in vitamin A include carrots and dark green, leafy vegetables. ? Foods rich in vitamin C include citrus fruits, tomatoes, broccoli, and peppers.  Drink enough fluid to keep your urine pale yellow. General instructions  Do not take baths, swim, use a hot tub, or do anything that would put the wound underwater until your health care provider approves. Ask your health care provider if you may take showers. You may only be allowed to take sponge baths.  Do not scratch or pick at the wound. Keep it covered as told by your health care provider.  Return to your normal activities as told by your health care provider. Ask your health care provider what activities are safe for you.  Protect your wound from the sun when you are outside for the first 6 months, or for as long as told by your health care provider. Cover up the scar area or apply sunscreen that has an SPF of at least 30.  Do not use any products that contain nicotine or tobacco, such as cigarettes, e-cigarettes, and chewing tobacco. These may delay wound healing. If you need help quitting, ask your health care provider.  Keep all follow-up visits as told by your health care provider. This is important. Contact a health care provider  if:  You received a tetanus shot and you have swelling, severe pain, redness, or bleeding at the injection site.  Your pain is not controlled with medicine.  You have any of these signs of infection: ? More redness, swelling, or pain around the wound. ? Fluid or blood coming from the wound. ? Warmth coming from the wound. ? Pus or a bad smell coming from the wound. ? A fever or chills.  You are nauseous or you vomit.  You are dizzy. Get help right away if:  You have a red streak of skin near the area around your wound.  Your wound has been closed with staples, sutures, skin glue, or adhesive strips and it begins to open up and separate.  Your wound is bleeding, and the bleeding does not stop with gentle pressure.  You have a rash.  You faint.  You have trouble breathing. These symptoms may represent a serious  problem that is an emergency. Do not wait to see if the symptoms will go away. Get medical help right away. Call your local emergency services (911 in the [place] Do not drive yourself to the hospital. Summary  Always wash your hands with soap and water for at least 20 seconds before and after changing your dressing.  Change your dressing as told by your health care provider.  To help with healing, eat foods that are rich in protein, vitamin A, vitamin C, and other nutrients.  Check your wound every day for signs of infection. Contact your health care provider if you suspect that your wound is infected. This information is not intended to replace advice given to you by your health care provider. Make sure you discuss any questions you have with your health care provider. Document Revised: 08/13/2019 Document Reviewed: 08/13/2019 Elsevier Patient Education  2021 [employer].

## 2021-02-09 NOTE — Progress Notes (Signed)
Discharge instructions given to patient. I went over discharge instructions with patient. He verbalizes understating. He is also able to demonstrate how to use his insulin pen. Wheeled out of the unit via wheelchair.
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
## 2021-02-09 NOTE — Progress Notes (Signed)
Initial Nutrition Assessment  DOCUMENTATION CODES:   Not applicable  INTERVENTION:    Ensure Max po once daily, each supplement provides 150 kcal and 30 grams of protein.   MVI with minerals daily.  Diabetes diet education provided.  NUTRITION DIAGNOSIS:   Increased nutrient needs related to wound healing as evidenced by estimated needs.  GOAL:   Patient will meet greater than or equal to 90% of their needs  MONITOR:   PO intake,Supplement acceptance  REASON FOR ASSESSMENT:   Consult Diet education  ASSESSMENT:   60 yo male admitted with LE swelling, R foot cellulitis. PMH includes uncontrolled type 2 DM, alcohol use.   Spoke with patient at bedside. He reports that he is eating well. Discussed diabetes diet recommendations.  Reviewed the plate method and provided a handout.  Also discussed portion sizes and recommendation for 45-60 gm carbohydrate intake with each meal. Encouraged patient to eat adequate protein to support healing. Patient appreciative of RD visit, however, unsure how much he will remember or be able to change at home. Will need continued reinforcement.   Labs reviewed. Na 134, mag 1.6 CBG: 221-216  Medications reviewed and include folic acid, Novolog SSI, Lantus, MVI with minerals, thiamine.  Patient is unsure of usual weight, but thinks his weight has been stable.  NUTRITION - FOCUSED PHYSICAL EXAM:  Flowsheet Row Most Recent Value  Orbital Region No depletion  Upper Arm Region No depletion  Thoracic and Lumbar Region No depletion  Buccal Region No depletion  Temple Region No depletion  Clavicle Bone Region No depletion  Clavicle and Acromion Bone Region No depletion  Scapular Bone Region No depletion  Dorsal Hand No depletion  Patellar Region No depletion  Anterior Thigh Region No depletion  Posterior Calf Region No depletion  Edema (RD Assessment) Severe  Hair Reviewed  Eyes Reviewed  Mouth Reviewed  Skin Reviewed  Nails  Reviewed       Diet Order:   Diet Order            Diet heart healthy/carb modified Room service appropriate? Yes; Fluid consistency: Thin  Diet effective now                 EDUCATION NEEDS:   Education needs have been addressed  Skin:  Skin Assessment: Reviewed RN Assessment  Last BM:  3/30  Height:   Ht Readings from Last 1 Encounters:  02/07/21 5\' 11"  (1.803 m)    Weight:   Wt Readings from Last 1 Encounters:  02/09/21 113 kg    Ideal Body Weight:  78.2 kg  BMI:  Body mass index is 34.76 kg/m.  Estimated Nutritional Needs:   Kcal:  2200-2400  Protein:  115-135 gm  Fluid:  2.2 L    04/11/21, RD, LDN, CNSC Please refer to Amion for contact information.

## 2021-02-09 NOTE — Consult Note (Signed)
WOC Nurse Consult Note: Patient receiving care in [facility] 5C13.   From review of record, I see Vascular Surgery is involved in the patient's care.  I am signing off the WOC nurses and deferring to Vascular Specialists for care recommendations. Helmut Muster, RN, MSN, CWOCN, CNS-BC, pager [phone]

## 2021-02-09 NOTE — TOC Progression Note (Addendum)
Transition of Care [facility]) - Progression Note    Patient Details  Name: ERSEL ENSLIN MRN: [id number] Date of Birth: [birth year]  Transition of Care [facility]) CM/SW Contact  Nadene Rubins Adria Devon, RN Phone Number: 02/09/2021, 10:34 AM  Clinical Narrative:     HHRN ordered. Confirmed face sheet information . He has friends who can assist him at home.   Discussed with bedside nurse she will provide patient wound care education and NCM a list of needed dressing supplies. IF declined by [facility] NCM will order dressing supplies through [employer]   Discussed Home health RN with patient. [facility] assisting patient's with no insurance with home health. Left message for [place] at [facility] . [place] returned call. [place] with [facility] she will have to review and make determination.   Patient aware. If [facility] accepts he will still need to learn wound care because nurse will not be able to make daily visits.   Patient believes if shown he can do dressing changes.   Dr Orvan Falconer will follow until patient's appointment with Gwinda Passe February 26, 2021.  Entered in [facility] zero co pay   [facility] accepted for [facility]   Expected Discharge Plan: Home/Self Care    Expected Discharge Plan and Services Expected Discharge Plan: Home/Self Care In-house Referral: Financial Counselor,PCP / [facility] Discharge Planning Services: CM Consult,Indigent Health [facility] Program,Medication Assistance   Living arrangements for the past 2 months: Single Family Home                 DME Arranged: N/A DME Agency: NA       HH Arranged: NA           Social Determinants of Health (SDOH) Interventions    Readmission Risk Interventions No flowsheet data found.

## 2021-02-09 NOTE — Discharge Summary (Addendum)
[facility] Discharge Summary  Patient name: Jesse [name] Medical record number: [id number] Date of birth: 1961-04-28 Age: 60 y.o. Gender: male Date of Admission: 02/07/2021  Date of Discharge: 02/09/2021 Admitting Physician: Lenoria Chime, MD  Primary Care Provider: Patient, No Pcp Per (Inactive) Consultants: None  Indication for Hospitalization: leg swelling, toe infection  Discharge Diagnoses/Problem List:  Bilateral leg swelling Right forefoot cellulitis Hepatic steatosis Uncontrolled type 2 diabetes Elevated blood pressures Hyperlipidemia Hypomagnesemia Alcohol abuse  Disposition: Home  Discharge Condition: Stable  Discharge Exam:  Temp:  [97.6 F (36.4 C)-98.4 F (36.9 C)] 98.1 F (36.7 C) (04/01 2009) Pulse Rate:  [80-90] 90 (04/01 2009) Resp:  [18-19] 18 (04/01 2009) BP: (137-173)/(84-105) 159/105 (04/01 2009) SpO2:  [95 %-98 %] 97 % (04/01 2009) Weight:  [426 kg] 113 kg (04/01 0446) Physical Exam: General: Awake, alert, oriented, no acute distress Cardiovascular: RRR, no murmur Respiratory: CTA B Extremities: 3+ pitting edema to knee  Brief Hospital Course:  Jesse [name] is a 60 y.o.male who presented with lower extremity swelling. PMH is significant for uncontrolled T2DM and alcohol abuse.   Bilateral Leg Swelling In ED, hypertensive up to 834'H systolic but afebrile and otherwise stable. CBC and CMP unremarkable. No leukocytosis or concern for sepsis. BNP 8.9. CXR with mild pulmonary vascular congestion. Exam with 3-4+ edema in b/l lower extremities. No crackles or JVD. Blood cultures collected and ultimately showed no growth 2 days. Given IV Lasix 40 mg x1 with good UOP. Given hx of alcohol abuse, RUQ U/S collected and showed hepatic steatosis without evidence of cirrhosis. Echo unremarkable. DVT U/S negative for DVT but did show enlarged lymph does in groin. ABI showed non-compressible right and left lower extremity  arteries, unable to obtain TBI due to his toe size and ulceration. Vascular consulted as a result and recommended right great toe amputation, however patient declined. Patient discharged with wound care teaching and home health wound care service.   Right forefoot cellulitis  Initially treated with IV abx as there was concern for possible osteomyelitis. Received IV vanc, flagyl and cefepime until HOD#2. MRI did not show any evidence of osteomyelitis so IV abx d/c and patient transitioned to oral doxycycline for MRSA coverage (given hx of diabetes and recent Augmentin use <1 month ago). Patient instructed to finish 7-day course.  Hepatic Steatosis Seen on RUQ U/S. Lifestyle modifications encouraged, will need ongoing support and education about this.   Uncontrolled Type 2 Diabetes Mellitus Hgb A1c 10.5. Started on sliding scale insulin and 5U Lantus while inpatient. This was eventually titrated up to 10 units. Diabetes educator consulted during hospitalization. Discharged on 10 units Lantus.   Hypertension Patient with elevated BP's throughout admission, as high as 962'I systolic. Started on Losartan 25 mg for renal protection. Will need BP follow up.   Hyperlipidemia Lipid panel with elevated cholesterol at 223, LDL 152. Not on a statin but should be for primary prevention. ASCVD risk score 23.2% due to being african [place], with untreated HTN, elevated cholesterol, T2DM, male sex. Started on Atorvastatin 40 mg during hospitalization for primary prevention.   Alcohol Abuse Patient with a history of drinking two 40 oz beers along with a four loko daily. CIWA's monitored during admission. Did not require Ativan PRN. Substance abuse resources provided by CSW.   Issues for Follow Up:  1. OP follow up for microscopic hematuria. 2. Doxycycline- 7 day course, end 4/4. 3. Recheck lipid panel in 1-3 months.  4. Recheck  BP. Started on Losartan 64m during hospitalization.  5. Provide resources and  encourage support for alcohol cessation.  6. Continue to provide ongoing diabetes education given hx non-adherence and poor health literacy.  7. Hepatic steatosis noted on RUQ U/S, consider referral to hepatology.   Significant Procedures: None  Significant Labs and Imaging:  Recent Labs  Lab 02/07/21 1210 02/08/21 0538 02/09/21 0453  WBC 8.0 7.0 6.6  HGB 13.2 12.8* 13.0  HCT 39.3 37.3* 37.7*  PLT 245 217 248   Recent Labs  Lab 02/07/21 1210 02/08/21 0538 02/09/21 0453  NA 135 135 134*  K 4.1 3.9 3.7  CL 102 103 102  CO2 '25 25 24  ' GLUCOSE 251* 200* 184*  BUN '13 14 15  ' CREATININE 1.14 1.29* 1.24  CALCIUM 9.3 9.1 9.3  MG  --  1.4* 1.6*  PHOS  --  4.0  --   ALKPHOS 75  --   --   AST 24  --   --   ALT 24  --   --   ALBUMIN 3.5  --   --    RIGHT FOOT - 2 VIEW 02/07/2021 IMPRESSION: No acute osseous abnormality. Significant dorsal soft tissue Swelling  MRI OF THE RIGHT FOREFOOT WITHOUT AND WITH CONTRAST 02/07/2021 IMPRESSION: Somewhat limited evaluation of the distal first phalanx. However no definite evidence of osteomyelitis. Area of superficial ulceration on the dorsum of the first digit with diffuse cellulitis of the forefoot. No soft tissue abscess.  ULTRASOUND ABDOMEN LIMITED RIGHT UPPER QUADRANT 02/07/2021 Gallbladder: No gallstones or wall thickening visualized. No sonographic Murphy sign noted by sonographer.  Common bile duct: Diameter: 4.5 mm  Liver: Increase in echogenicity likely related to fatty infiltration. No focal mass is noted. Portal vein is patent on color Doppler imaging with normal direction of blood flow towards the liver.  Other: None.  IMPRESSION: Fatty liver.  No other focal abnormality is noted.  CT ABDOMEN AND PELVIS WITHOUT AND WITH CONTRAST 02/09/2021 FINDINGS: The study was interrupted by the patient's need to urinate during the examination. The images consist of pre contrast and delayed post-contrast series. There is not  a good portal phase study.  Lower chest: Mild right hemidiaphragm elevation with associated right basilar atelectasis. There is atherosclerosis of the coronary arteries. No significant pleural or pericardial effusion.  Hepatobiliary: Pre contrast images demonstrate decreased hepatic density consistent with steatosis. Limited contrast bolus limits assessment of the liver, but no focal abnormality or abnormal enhancement identified. The gallbladder is incompletely distended. No evidence of gallstones, gallbladder wall thickening or biliary dilatation.  Pancreas: Unremarkable. No pancreatic ductal dilatation or surrounding inflammatory changes.  Spleen: Normal in size without focal abnormality.  Adrenals/Urinary Tract: Both adrenal glands appear normal. The right kidney appears unremarkable, without evidence of urinary tract calculus, mass lesion or abnormal enhancement. There is no hydronephrosis. There is no functional left renal tissue. There may be a small amount of dysplastic left renal tissue. The bladder appears unremarkable.  Stomach/Bowel: No enteric contrast was administered. The stomach appears unremarkable for its degree of distension. No evidence of bowel wall thickening, distention or surrounding inflammatory change. The appendix appears normal.  Vascular/Lymphatic: There are no enlarged abdominal lymph nodes. There is a mildly enlarged right external iliac node measuring 15 mm on image 81/15. Small inguinal lymph nodes do not appear pathologically enlarged. There is mild aortic and branch vessel atherosclerosis without acute vascular findings.  Reproductive: The prostate gland and seminal vesicles appear normal. Postsurgical changes in both  inguinal canals.  Other: No evidence of abdominal wall mass or hernia. No ascites.  Musculoskeletal: No acute or significant osseous findings. Multilevel lumbar spondylosis.  IMPRESSION: 1. No acute findings or  explanation for hematuria identified. There is no evidence of urinary tract calculus or hydronephrosis. 2. 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 The right kidney demonstrates mild compensatory hypertrophy. 3. Hepatic steatosis. 4. Single mildly enlarged right external iliac lymph nodes with prominent inguinal nodes bilaterally, likely reactive. 5.  Aortic Atherosclerosis (ICD10-I70.0).   Results/Tests Pending at Time of Discharge: None  Discharge Medications:  Allergies as of 02/09/2021   No Known Allergies     Medication List    STOP taking these medications   metFORMIN 500 MG tablet Commonly known as: Glucophage   predniSONE 20 MG tablet Commonly known as: DELTASONE     TAKE these medications   atorvastatin 40 MG tablet Commonly known as: LIPITOR Take 1 tablet (40 mg total) by mouth daily. Start taking on: February 10, 2021   blood glucose meter kit and supplies Dispense based on patient and insurance preference. Use up to four times daily as directed. (FOR ICD-10 E10.9, E11.9).   doxycycline 100 MG tablet Commonly known as: VIBRA-TABS Take 1 tablet (100 mg total) by mouth daily for 3 days.   insulin glargine 100 UNIT/ML Solostar Pen Commonly known as: LANTUS Inject 10 Units into the skin daily.   losartan 25 MG tablet Commonly known as: Cozaar Take 1 tablet (25 mg total) by mouth daily.            Durable Medical Equipment  (From admission, onward)         Start     Ordered   02/09/21 1230  For home use only DME Other see comment  Once       Comments: iodine, sween moisturizer, xeroform, kerlex, and 4 inch ace wrap  Question:  Length of Need  Answer:  6 Months   02/09/21 1229           Discharge Care Instructions  (From admission, onward)         Start     Ordered   02/09/21 0000  Discharge wound care:       Comments: Wash legs with soap and water. Pat dry. Apply Sween Moisturizing Ointment. Place Xeroform  gauze over the wounds on the LEs.  Beginning behind the toes and going to just below the knees, spiral wrap kerlex, then 4 inch ace wrap. Perform daily.  Apply iodine from the swabsticks or swab pads from clean utility to right great toe wound.  Allow to air dry.   02/09/21 1904          Discharge Instructions: Please refer to Patient Instructions section of EMR for full details.  Patient was counseled important signs and symptoms that should prompt return to medical care, changes in medications, dietary instructions, activity restrictions, and follow up appointments.   Follow-Up Appointments:  Follow-up Information    Kerin Perna, NP Follow up.   Specialty: Internal Medicine Why: February 26, 2021 at 0930 am  Contact information: [address] [place] [address] [phone]        [place]. Go on 02/16/2021.   Specialty: Family Medicine Why: '@10' :10am. This is a hospital follow up appointment with Dr. Adrienne Mocha, one of the physicians whom you saw in the hospital. This is meant to bridge you until you establish care at the internal  medicine clinic on 02/26/21.  Contact information: [address] [id number] [place] [phone]       [facility] Follow up.   Specialty: Home Health Services Contact information: [address] [place] [address] [phone]               Ezequiel Essex, MD 02/09/2021, 8:50 PM PGY-1, [facility]

## 2021-02-13 LAB — CULTURE, BLOOD (ROUTINE X 2)
Culture: NO GROWTH
Culture: NO GROWTH
Special Requests: ADEQUATE

## 2021-02-16 ENCOUNTER — Ambulatory Visit: Payer: Self-pay | Admitting: Family Medicine

## 2021-02-26 ENCOUNTER — Inpatient Hospital Stay (INDEPENDENT_AMBULATORY_CARE_PROVIDER_SITE_OTHER): Payer: Self-pay | Admitting: Primary Care

## 2021-03-22 ENCOUNTER — Inpatient Hospital Stay (INDEPENDENT_AMBULATORY_CARE_PROVIDER_SITE_OTHER): Payer: Self-pay | Admitting: Primary Care

## 2021-04-08 ENCOUNTER — Emergency Department (HOSPITAL_COMMUNITY): Payer: Self-pay

## 2021-04-08 ENCOUNTER — Inpatient Hospital Stay (HOSPITAL_COMMUNITY)
Admission: EM | Admit: 2021-04-08 | Discharge: 2021-04-09 | DRG: 605 | Discharge status: Against Medical Advice | Payer: Self-pay | Attending: Internal Medicine | Admitting: Internal Medicine

## 2021-04-08 ENCOUNTER — Encounter (HOSPITAL_COMMUNITY): Payer: Self-pay | Admitting: Emergency Medicine

## 2021-04-08 ENCOUNTER — Other Ambulatory Visit: Payer: Self-pay

## 2021-04-08 DIAGNOSIS — S91331A Puncture wound without foreign body, right foot, initial encounter: Principal | ICD-10-CM | POA: Diagnosis present

## 2021-04-08 DIAGNOSIS — Z5329 Procedure and treatment not carried out because of patient's decision for other reasons: Secondary | ICD-10-CM | POA: Diagnosis present

## 2021-04-08 DIAGNOSIS — Z23 Encounter for immunization: Secondary | ICD-10-CM

## 2021-04-08 DIAGNOSIS — L089 Local infection of the skin and subcutaneous tissue, unspecified: Principal | ICD-10-CM | POA: Diagnosis present

## 2021-04-08 DIAGNOSIS — Z794 Long term (current) use of insulin: Secondary | ICD-10-CM

## 2021-04-08 DIAGNOSIS — IMO0002 Reserved for concepts with insufficient information to code with codable children: Secondary | ICD-10-CM | POA: Diagnosis present

## 2021-04-08 DIAGNOSIS — Z9119 Patient's noncompliance with other medical treatment and regimen: Secondary | ICD-10-CM

## 2021-04-08 DIAGNOSIS — F1010 Alcohol abuse, uncomplicated: Secondary | ICD-10-CM | POA: Diagnosis present

## 2021-04-08 DIAGNOSIS — E119 Type 2 diabetes mellitus without complications: Secondary | ICD-10-CM | POA: Diagnosis present

## 2021-04-08 DIAGNOSIS — I10 Essential (primary) hypertension: Secondary | ICD-10-CM | POA: Diagnosis present

## 2021-04-08 DIAGNOSIS — E11628 Type 2 diabetes mellitus with other skin complications: Secondary | ICD-10-CM

## 2021-04-08 DIAGNOSIS — E1165 Type 2 diabetes mellitus with hyperglycemia: Secondary | ICD-10-CM | POA: Diagnosis present

## 2021-04-08 DIAGNOSIS — W450XXA Nail entering through skin, initial encounter: Secondary | ICD-10-CM

## 2021-04-08 DIAGNOSIS — E785 Hyperlipidemia, unspecified: Secondary | ICD-10-CM

## 2021-04-08 DIAGNOSIS — Z20822 Contact with and (suspected) exposure to covid-19: Secondary | ICD-10-CM | POA: Diagnosis present

## 2021-04-08 HISTORY — DX: Local infection of the skin and subcutaneous tissue, unspecified: L08.9

## 2021-04-08 HISTORY — DX: Type 2 diabetes mellitus with other skin complications: E11.628

## 2021-04-08 LAB — CBC WITH DIFFERENTIAL/PLATELET
Abs Immature Granulocytes: 0.71 10*3/uL — ABNORMAL HIGH (ref 0.00–0.07)
Basophils Absolute: 0.1 10*3/uL (ref 0.0–0.1)
Basophils Relative: 0 %
Eosinophils Absolute: 0.1 10*3/uL (ref 0.0–0.5)
Eosinophils Relative: 0 %
HCT: 36.8 % — ABNORMAL LOW (ref 39.0–52.0)
Hemoglobin: 12.4 g/dL — ABNORMAL LOW (ref 13.0–17.0)
Immature Granulocytes: 3 %
Lymphocytes Relative: 7 %
Lymphs Abs: 1.7 10*3/uL (ref 0.7–4.0)
MCH: 31.7 pg (ref 26.0–34.0)
MCHC: 33.7 g/dL (ref 30.0–36.0)
MCV: 94.1 fL (ref 80.0–100.0)
Monocytes Absolute: 2.1 10*3/uL — ABNORMAL HIGH (ref 0.1–1.0)
Monocytes Relative: 8 %
Neutro Abs: 21.1 10*3/uL — ABNORMAL HIGH (ref 1.7–7.7)
Neutrophils Relative %: 82 %
Platelets: 331 10*3/uL (ref 150–400)
RBC: 3.91 MIL/uL — ABNORMAL LOW (ref 4.22–5.81)
RDW: 13 % (ref 11.5–15.5)
WBC: 25.8 10*3/uL — ABNORMAL HIGH (ref 4.0–10.5)
nRBC: 0 % (ref 0.0–0.2)

## 2021-04-08 LAB — COMPREHENSIVE METABOLIC PANEL
ALT: 33 U/L (ref 0–44)
AST: 22 U/L (ref 15–41)
Albumin: 3 g/dL — ABNORMAL LOW (ref 3.5–5.0)
Alkaline Phosphatase: 149 U/L — ABNORMAL HIGH (ref 38–126)
Anion gap: 11 (ref 5–15)
BUN: 31 mg/dL — ABNORMAL HIGH (ref 6–20)
CO2: 21 mmol/L — ABNORMAL LOW (ref 22–32)
Calcium: 9.4 mg/dL (ref 8.9–10.3)
Chloride: 99 mmol/L (ref 98–111)
Creatinine, Ser: 1.31 mg/dL — ABNORMAL HIGH (ref 0.61–1.24)
GFR, Estimated: >60 mL/min (ref >60)
Glucose, Bld: 186 mg/dL — ABNORMAL HIGH (ref 70–99)
Potassium: 4.3 mmol/L (ref 3.5–5.1)
Sodium: 131 mmol/L — ABNORMAL LOW (ref 135–145)
Total Bilirubin: 0.3 mg/dL (ref 0.3–1.2)
Total Protein: 8.3 g/dL — ABNORMAL HIGH (ref 6.5–8.1)

## 2021-04-08 LAB — LACTIC ACID, PLASMA
Lactic Acid, Venous: 1.1 mmol/L (ref 0.5–1.9)
Lactic Acid, Venous: 1 mmol/L (ref 0.5–1.9)

## 2021-04-08 LAB — SEDIMENTATION RATE: Sed Rate: 108 mm/hr — ABNORMAL HIGH (ref 0–16)

## 2021-04-08 LAB — CBG MONITORING, ED: Glucose-Capillary: 212 mg/dL — ABNORMAL HIGH (ref 70–99)

## 2021-04-08 IMAGING — DX DG FOOT COMPLETE 3+V*R*
3 series · 3 of 3 positions shown · non-contrast
Comparison: [DATE]

CLINICAL DATA: Stepped on a nail several weeks ago, open wound
medial plantar aspect of right foot

EXAM:
RIGHT FOOT COMPLETE - 3+ VIEW

[foot ap]
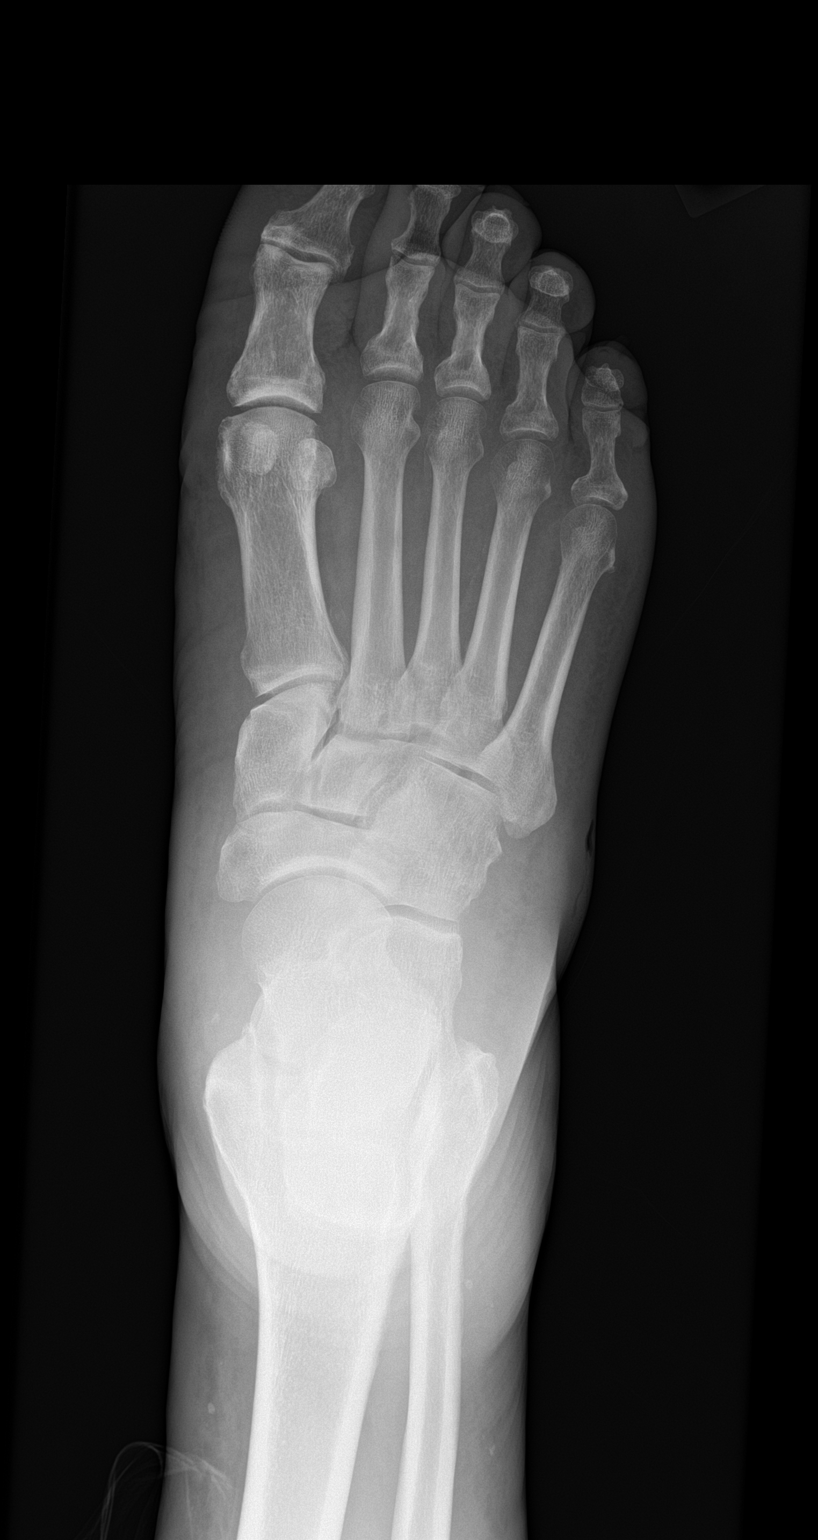

[foot obl]
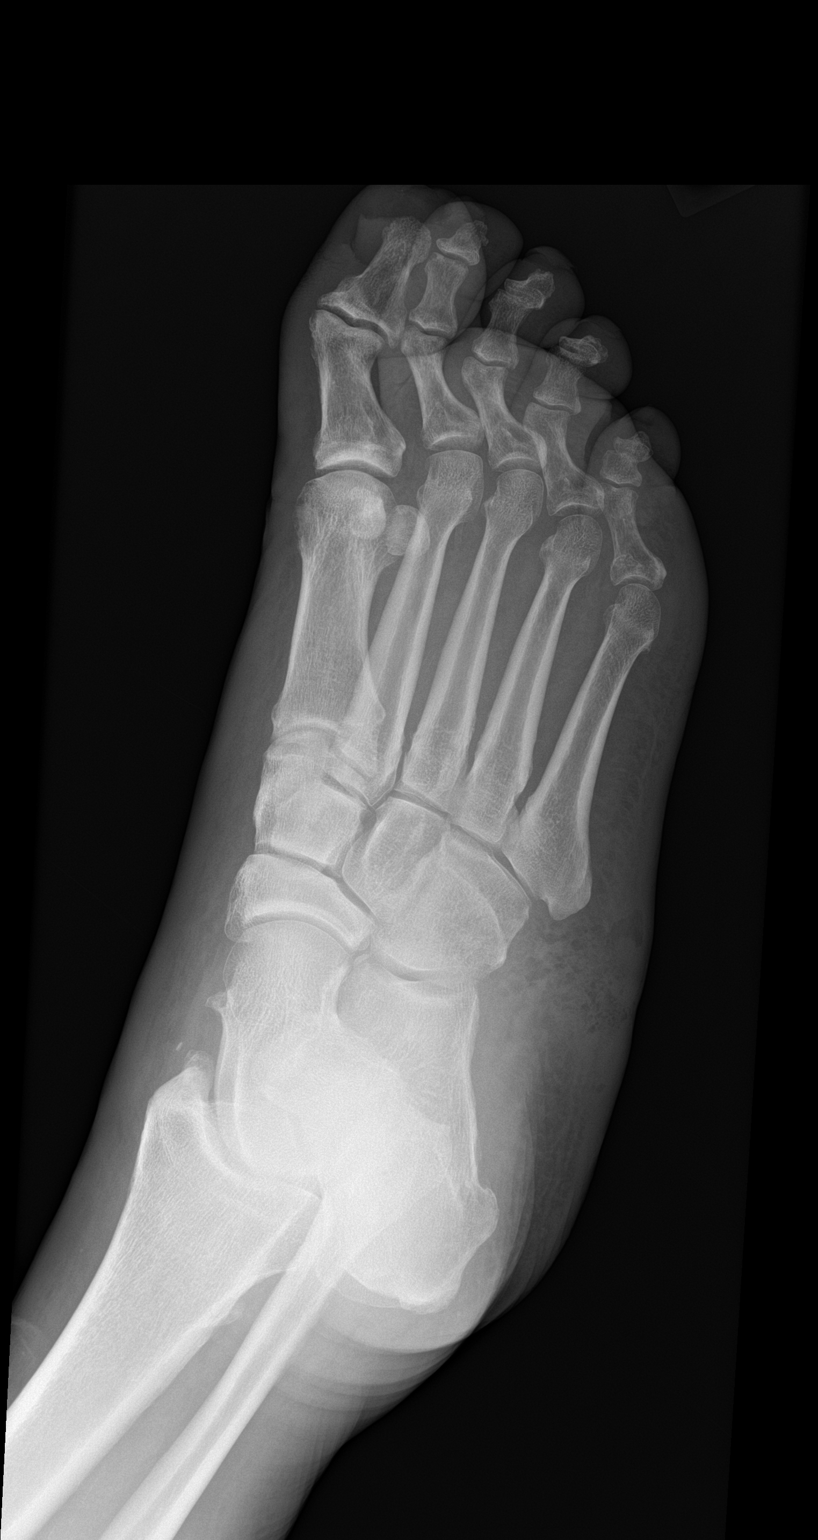

[foot lat]
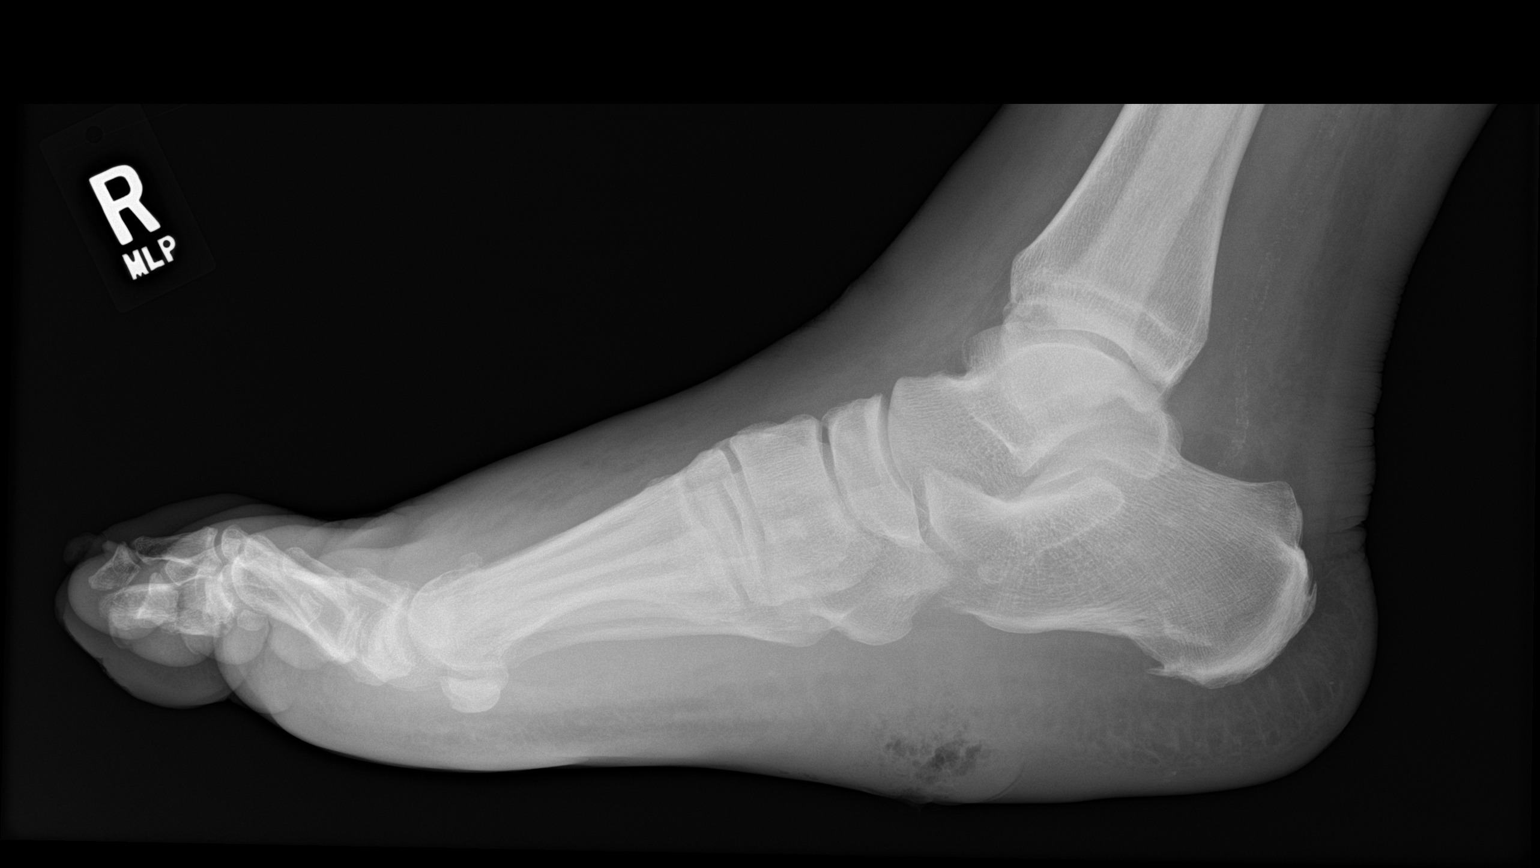

[3 of 3 positions shown; findings below may reference images not displayed]

FINDINGS: Frontal, oblique, lateral views of the right foot are obtained.
There are no acute displaced fractures. Alignment is anatomic. No
radiopaque foreign bodies.

There is diffuse soft tissue swelling of the foot. Subcutaneous gas
is seen within the plantar lateral aspect of the right foot. No
destructive bony lesions or periosteal reaction to suggest
osteomyelitis.
IMPRESSION: 1. Diffuse soft tissue swelling, with subcutaneous gas plantar
lateral aspect right foot.
2. No acute or destructive bony lesions.
3. No radiopaque foreign body.

## 2021-04-08 MED ORDER — THIAMINE HCL 100 MG/ML IJ SOLN: 100.0000 mg | Freq: Every day | INTRAMUSCULAR | Status: DC

## 2021-04-08 MED ORDER — PIPERACILLIN-TAZOBACTAM 3.375 G IVPB: 3.3750 g | Freq: Three times a day (TID) | INTRAVENOUS | Status: DC

## 2021-04-08 MED ORDER — FOLIC ACID 1 MG PO TABS: 1.0000 mg | ORAL_TABLET | Freq: Every day | ORAL | Status: DC

## 2021-04-08 MED ORDER — THIAMINE HCL 100 MG PO TABS: 100.0000 mg | ORAL_TABLET | Freq: Every day | ORAL | Status: DC

## 2021-04-08 MED ORDER — ATORVASTATIN CALCIUM 40 MG PO TABS: 40.0000 mg | ORAL_TABLET | Freq: Every day | ORAL | Status: DC

## 2021-04-08 MED ORDER — SODIUM CHLORIDE 0.9 % IV SOLN
2.0000 g | Freq: Once | INTRAVENOUS | Status: AC
  Administered 2021-04-08: 2 g via INTRAVENOUS
  Filled 2021-04-08: qty 2

## 2021-04-08 MED ORDER — LORAZEPAM 1 MG PO TABS: 1.0000 mg | ORAL_TABLET | ORAL | Status: DC | PRN

## 2021-04-08 MED ORDER — VANCOMYCIN HCL IN DEXTROSE 1-5 GM/200ML-% IV SOLN
1000.0000 mg | Freq: Once | INTRAVENOUS | Status: AC
  Administered 2021-04-08: 1000 mg via INTRAVENOUS
  Filled 2021-04-08: qty 200

## 2021-04-08 MED ORDER — INSULIN GLARGINE 100 UNIT/ML ~~LOC~~ SOLN: 5.0000 [IU] | Freq: Every day | SUBCUTANEOUS | Status: DC

## 2021-04-08 MED ORDER — CLINDAMYCIN PHOSPHATE 600 MG/50ML IV SOLN
600.0000 mg | Freq: Three times a day (TID) | INTRAVENOUS | Status: DC
  Administered 2021-04-08: 600 mg via INTRAVENOUS
  Filled 2021-04-08: qty 50

## 2021-04-08 MED ORDER — ADULT MULTIVITAMIN W/MINERALS CH: 1.0000 | ORAL_TABLET | Freq: Every day | ORAL | Status: DC

## 2021-04-08 MED ORDER — VANCOMYCIN HCL 1500 MG/300ML IV SOLN: 1500.0000 mg | INTRAVENOUS | Status: DC

## 2021-04-08 MED ORDER — ACETAMINOPHEN 325 MG PO TABS: 650.0000 mg | ORAL_TABLET | Freq: Four times a day (QID) | ORAL | Status: DC | PRN

## 2021-04-08 MED ORDER — TETANUS-DIPHTH-ACELL PERTUSSIS 5-2.5-18.5 LF-MCG/0.5 IM SUSY
0.5000 mL | PREFILLED_SYRINGE | Freq: Once | INTRAMUSCULAR | Status: AC
  Administered 2021-04-08: 0.5 mL via INTRAMUSCULAR
  Filled 2021-04-08: qty 0.5

## 2021-04-08 MED ORDER — LORAZEPAM 2 MG/ML IJ SOLN
1.0000 mg | INTRAMUSCULAR | Status: DC | PRN
Start: 2021-04-08 — End: 2021-04-09

## 2021-04-08 MED ORDER — INSULIN ASPART 100 UNIT/ML IJ SOLN
0.0000 [IU] | Freq: Every day | INTRAMUSCULAR | Status: DC
  Administered 2021-04-08: 2 [IU] via SUBCUTANEOUS
  Filled 2021-04-08: qty 0.05

## 2021-04-08 MED ORDER — INSULIN ASPART 100 UNIT/ML IJ SOLN
0.0000 [IU] | Freq: Three times a day (TID) | INTRAMUSCULAR | Status: DC
  Filled 2021-04-08: qty 0.09

## 2021-04-08 MED ORDER — ACETAMINOPHEN 650 MG RE SUPP: 650.0000 mg | Freq: Four times a day (QID) | RECTAL | Status: DC | PRN

## 2021-04-08 NOTE — ED Triage Notes (Signed)
Patient stepped on a nail a few weeks ago. Patient states nail was in his foot for a few days and he didn't know and removed it. Patient noted to have an open wound to the inside aspect of his right foot. Noted to be draining.

## 2021-04-08 NOTE — ED Notes (Signed)
Pt is aware that a urine specimen is needed.

## 2021-04-08 NOTE — Progress Notes (Signed)
Pharmacy Antibiotic Note  Jesse [name] is a 60 y.o. male admitted on 04/08/2021 with wound infection in his right foot due to stepping on a nail.   Pharmacy has been consulted for vancomycin and zosyn dosing.  Pt received vanc 1gm and cefepime 2gm in ED  Plan: Vancomycin 1500mg  IV q24h (AUC 476.3, Scr 1.3) Zosyn 3.375g IV Q8H infused over 4hrs. Daily Scr Follow renal function, cultures and clinical course   Height: 6' (182.9 cm) Weight: 104.3 kg (230 lb) IBW/kg (Calculated) : 77.6  Temp (24hrs), Avg:98.4 F (36.9 C), Min:98.4 F (36.9 C), Max:98.4 F (36.9 C)  Recent Labs  Lab 04/08/21 1949  WBC 25.8*  CREATININE 1.31*  LATICACIDVEN 1.1    Estimated Creatinine Clearance: 76.8 mL/min (A) (by C-G formula based on SCr of 1.31 mg/dL (H)).    No Known Allergies  Antimicrobials this admission: 5/29 cefepime x 1 5/29 vanc >> 5/29 clinda >> 5/30 zosyn >> Dose adjustments this admission:   Microbiology results: 5/29 BCx:    Thank you for allowing pharmacy to be a part of this patient's care. 6/29 RPh 04/08/2021, 10:50 PM

## 2021-04-08 NOTE — ED Provider Notes (Signed)
[facility] DEPT Provider Note   CSN: [id number] Arrival date & time: 04/08/21  1923     History Chief Complaint  Patient presents with  . Wound Infection    Jesse [name] is a 60 y.o. male.  The history is provided by the patient.  Foot Injury Location:  Foot Time since incident:  1 week Injury: yes   Mechanism of injury comment:  Stepped on a nail with his bare foot Foot location:  Sole of R foot Pain details:    Quality:  Throbbing   Radiates to:  Does not radiate   Onset quality:  Sudden   Duration:  1 week   Timing:  Constant   Progression:  Worsening Chronicity:  New Foreign body present:  No foreign bodies Tetanus status:  Out of date Prior injury to area: History of wound to the right great toe. Relieved by:  Nothing Worsened by:  Bearing weight Ineffective treatments: Tried to poke it with a pin to let pus out. Associated symptoms: swelling   Associated symptoms: no back pain, no fever and no numbness        History reviewed. No pertinent past medical history.  Patient Active Problem List   Diagnosis Date Noted  . Alcohol abuse   . Cellulitis   . Bilateral leg edema 02/07/2021  . Anasarca   . 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 2 diabetes mellitus, limited to breakdown of skin ([place])   . Hypokalemia 01/28/2014  . DM (diabetes mellitus), type 2, uncontrolled ([place]) 01/27/2014  . AKI (acute kidney injury) ([place]) 01/27/2014  . Leukocytosis, unspecified 01/27/2014  . Facial cellulitis 01/26/2014  . Dental abscess 01/26/2014  . Infected dental carries 01/26/2014  . Cellulitis and abscess 01/26/2014    History reviewed. No pertinent surgical history.     No family history on file.  Social History   Tobacco Use  . Smoking status: Never Smoker  . Smokeless tobacco: Never Used  Substance Use Topics  . Alcohol use: Yes    Alcohol/week: 2.0 standard drinks    Types: 2 Cans of beer per week     Comment: Occasional  . Drug use: No    Home Medications Prior to Admission medications   Medication Sig Start Date End Date Taking? Authorizing Provider  atorvastatin (LIPITOR) 40 MG tablet Take 1 tablet (40 mg total) by mouth daily. 02/10/21   Simmons-Robinson, Makiera, MD  atorvastatin (LIPITOR) 40 MG tablet TAKE 1 TABLET (40 MG TOTAL) BY MOUTH DAILY. 02/09/21 02/09/22  Simmons-Robinson, Riki Sheer, MD  blood glucose meter kit and supplies Dispense based on patient and insurance preference. Use up to four times daily as directed. (FOR ICD-10 E10.9, E11.9). 02/09/21   Simmons-Robinson, Riki Sheer, MD  Blood Glucose Monitoring Suppl (TRUE METRIX METER) w/Device KIT USE UP TO FOUR TIMES DAILY AS DIRECTED. (FOR ICD-10 E10.9, E11.9). 02/09/21 02/09/22  Simmons-Robinson, Riki Sheer, MD  doxycycline (VIBRA-TABS) 100 MG tablet TAKE 1 TABLET (100 MG TOTAL) BY MOUTH DAILY FOR 3 DAYS. 02/09/21 02/09/22  Simmons-Robinson, Makiera, MD  glucose blood test strip USE UP TO FOUR TIMES DAILY AS DIRECTED 02/09/21 02/09/22  Simmons-Robinson, Riki Sheer, MD  insulin glargine (LANTUS) 100 UNIT/ML Solostar Pen Inject 10 Units into the skin daily. 02/09/21   Simmons-Robinson, Makiera, MD  insulin glargine (LANTUS) 100 UNIT/ML Solostar Pen INJECT 10 UNITS INTO THE SKIN DAILY. 02/09/21 02/09/22  Simmons-Robinson, Riki Sheer, MD  Insulin Pen Needle 31G X 8 MM MISC USE AS DIRECTED 02/09/21 02/09/22  Simmons-Robinson, Makiera, MD  losartan (COZAAR) 25 MG tablet Take 1 tablet (25 mg total) by mouth daily. 02/09/21 04/10/21  Simmons-Robinson, Makiera, MD  losartan (COZAAR) 25 MG tablet TAKE 1 TABLET (25 MG TOTAL) BY MOUTH DAILY. 02/09/21 02/09/22  Simmons-Robinson, Riki Sheer, MD  TRUEplus Lancets 28G MISC USE UP TO FOUR TIMES DAILY AS DIRECTED 02/09/21 02/09/22  Simmons-Robinson, Riki Sheer, MD  glipiZIDE (GLUCOTROL) 5 MG tablet Take 1 tablet (5 mg total) by mouth daily before breakfast. Patient not taking: Reported on 01/14/2016 01/31/14 01/14/16  Delfina Redwood, MD    Allergies     Patient has no known allergies.  Review of Systems   Review of Systems  Constitutional: Positive for appetite change. Negative for chills and fever.  HENT: Negative for ear pain and sore throat.   Eyes: Negative for pain and visual disturbance.  Respiratory: Negative for cough and shortness of breath.   Cardiovascular: Negative for chest pain and palpitations.  Gastrointestinal: Negative for abdominal pain and vomiting.  Genitourinary: Negative for dysuria and hematuria.  Musculoskeletal: Negative for arthralgias and back pain.  Skin: Negative for color change and rash.  Neurological: Negative for seizures and syncope.  All other systems reviewed and are negative.   Physical Exam Updated Vital Signs BP (!) 129/105 (BP Location: Right Arm)   Pulse 94   Temp 98.4 F (36.9 C) (Oral)   Resp 16   Ht 6' (1.829 m)   Wt 104.3 kg   SpO2 96%   BMI 31.19 kg/m   Physical Exam Vitals and nursing note reviewed.  Constitutional:      Appearance: Normal appearance.  HENT:     Head: Normocephalic and atraumatic.  Eyes:     Conjunctiva/sclera: Conjunctivae normal.  Pulmonary:     Effort: Pulmonary effort is normal. No respiratory distress.  Musculoskeletal:        General: Normal range of motion.     Cervical back: Normal range of motion.     Comments: The entire right erythematous and swollen.  There is a large wound encompassing the entire plantar aspect of the heel and arch on this foot.  There is scant, malodorous discharge from the wound.  Pulses are palpable.  Sensation is intact.  Swelling and erythema extend to the ankle, and there is some mild pain with ankle range of motion.  Skin:    General: Skin is warm and dry.  Neurological:     General: No focal deficit present.     Mental Status: He is alert and oriented to person, place, and time. Mental status is at baseline.  Psychiatric:        Mood and Affect: Mood normal.           ED Results / Procedures /  Treatments   Labs (all labs ordered are listed, but only abnormal results are displayed) Labs Reviewed  COMPREHENSIVE METABOLIC PANEL - Abnormal; Notable for the following components:      Result Value   Sodium 131 (*)    CO2 21 (*)    Glucose, Bld 186 (*)    BUN 31 (*)    Creatinine, Ser 1.31 (*)    Total Protein 8.3 (*)    Albumin 3.0 (*)    Alkaline Phosphatase 149 (*)    All other components within normal limits  CBC WITH DIFFERENTIAL/PLATELET - Abnormal; Notable for the following components:   WBC 25.8 (*)    RBC 3.91 (*)    Hemoglobin 12.4 (*)  HCT 36.8 (*)    Neutro Abs 21.1 (*)    Monocytes Absolute 2.1 (*)    Abs Immature Granulocytes 0.71 (*)    All other components within normal limits  CULTURE, BLOOD (ROUTINE X 2)  CULTURE, BLOOD (ROUTINE X 2)  SARS CORONAVIRUS 2 (TAT 6-24 HRS)  LACTIC ACID, PLASMA  LACTIC ACID, PLASMA  URINALYSIS, ROUTINE W REFLEX MICROSCOPIC  SEDIMENTATION RATE  C-REACTIVE PROTEIN    EKG None  Radiology DG Foot Complete Right  Result Date: 04/08/2021 CLINICAL DATA:  Stepped on a nail several weeks ago, open wound medial plantar aspect of right foot EXAM: RIGHT FOOT COMPLETE - 3+ VIEW COMPARISON:  02/07/2021 FINDINGS: Frontal, oblique, lateral views of the right foot are obtained. There are no acute displaced fractures. Alignment is anatomic. No radiopaque foreign bodies. There is diffuse soft tissue swelling of the foot. Subcutaneous gas is seen within the plantar lateral aspect of the right foot. No destructive bony lesions or periosteal reaction to suggest osteomyelitis. IMPRESSION: 1. Diffuse soft tissue swelling, with subcutaneous gas plantar lateral aspect right foot. 2. No acute or destructive bony lesions. 3. No radiopaque foreign body. Electronically Signed   By: Randa Ngo M.D.   On: 04/08/2021 20:54    Procedures Procedures   Medications Ordered in ED Medications  vancomycin (VANCOCIN) IVPB 1000 mg/200 mL premix (1,000 mg  Intravenous New Bag/Given 04/08/21 2056)  ceFEPIme (MAXIPIME) 2 g in sodium chloride 0.9 % 100 mL IVPB (2 g Intravenous New Bag/Given 04/08/21 2054)  Tdap (BOOSTRIX) injection 0.5 mL (0.5 mLs Intramuscular Given 04/08/21 2056)    ED Course  I have reviewed the triage vital signs and the nursing notes.  Pertinent labs & imaging results that were available during my care of the patient were reviewed by me and considered in my medical decision making (see chart for details).  Clinical Course as of 04/08/21 2152  Nancy Fetter Apr 08, 2021  2128 I spoke with Dr. Lynann Bologna with ortho. Will follow once MRI is obtained. Agrees with clinical course so far. [AW]  2129 I spoke with Dr. Marlowe Sax with [facility]. [AW]    Clinical Course User Index [AW] Arnaldo Natal, MD   MDM Rules/Calculators/A&P                          [name] presents with an infection of his right foot.  This occurred as a result of a puncture injury to his foot.  The wound appears extensive, and I am very concerned about deep infection.  He was given broad-spectrum antibiotics.  He will be admitted to the hospital for ongoing treatment.  MRI will be planned for tomorrow. Final Clinical Impression(s) / ED Diagnoses Final diagnoses:  Wound infection  Diabetic foot infection ([place])  Uncontrolled type 2 diabetes mellitus with hyperglycemia [facility])    Rx / DC Orders ED Discharge Orders    None       Arnaldo Natal, MD 04/08/21 2154

## 2021-04-08 NOTE — H&P (Addendum)
History and Physical    Jesse [name] EBX:[id number] DOB: [birth year] DOA: 04/08/2021  PCP: Patient, No Pcp Per (Inactive) Patient coming from: Home  Chief Complaint: Foot infection  HPI: Jesse [name] is a 60 y.o. male with medical history significant of poorly controlled insulin-dependent type 2 diabetes, hypertension, hyperlipidemia, alcohol abuse, hospital admission last month for right forefoot cellulitis presents to the ED with complaint of right foot wound after he stepped on a nail a few weeks ago.  In the ED, vital signs stable.  Labs showing WBC 25.8 with left shift, hemoglobin 12.4 (stable), platelet count 331K.  Sodium 131, potassium 4.3, chloride 99, bicarb 21, BUN 31, creatinine 1.3 (no significant change from baseline), glucose 186.  Lactic acid 1.1.  Blood culture x2 pending.  ESR and CRP pending.  Screening COVID test pending.  X-ray of right foot showing diffuse soft tissue swelling with subcutaneous gas at the plantar lateral aspect of the foot.  No signs of osteomyelitis. Patient was given vancomycin and cefepime.  ED physician discussed the case with on-call orthopedic surgeon who recommended obtaining a foot MRI to rule out osteomyelitis, Ortho will consult.  Patient states he stepped on a nail a week ago and since then his right foot is red, swollen, and draining pus from the bottom.  Anytime he tries to walk pus comes out.  He has also been poking the bottom of his foot with a needle to drain pus.  He thinks he had a fever at home but he did not check his temperature.  Patient initially adamantly denied history of diabetes or any medical conditions.  Stated he does not take any medications at home and does not have a primary care doctor.  He later stated at the time of his hospital discharge last month he was prescribed insulin and needles but he could not figure out how to use them.  He is not vaccinated against COVID.  Denies cough, shortness of breath, nausea, or  vomiting.  Review of Systems:  All systems reviewed and apart from history of presenting illness, are negative.  Past medical history: See HPI.  History reviewed. No pertinent surgical history.   reports that he has never smoked. He has never used smokeless tobacco. He reports current alcohol use of about 2.0 standard drinks of alcohol per week. He reports that he does not use drugs.  No Known Allergies  History reviewed. No pertinent family history.  Prior to Admission medications   Medication Sig Start Date End Date Taking? Authorizing Provider  atorvastatin (LIPITOR) 40 MG tablet Take 1 tablet (40 mg total) by mouth daily. 02/10/21   Simmons-Robinson, Makiera, MD  atorvastatin (LIPITOR) 40 MG tablet TAKE 1 TABLET (40 MG TOTAL) BY MOUTH DAILY. 02/09/21 02/09/22  Simmons-Robinson, Riki Sheer, MD  blood glucose meter kit and supplies Dispense based on patient and insurance preference. Use up to four times daily as directed. (FOR ICD-10 E10.9, E11.9). 02/09/21   Simmons-Robinson, Riki Sheer, MD  Blood Glucose Monitoring Suppl (TRUE METRIX METER) w/Device KIT USE UP TO FOUR TIMES DAILY AS DIRECTED. (FOR ICD-10 E10.9, E11.9). 02/09/21 02/09/22  Simmons-Robinson, Riki Sheer, MD  doxycycline (VIBRA-TABS) 100 MG tablet TAKE 1 TABLET (100 MG TOTAL) BY MOUTH DAILY FOR 3 DAYS. 02/09/21 02/09/22  Simmons-Robinson, Makiera, MD  glucose blood test strip USE UP TO FOUR TIMES DAILY AS DIRECTED 02/09/21 02/09/22  Simmons-Robinson, Riki Sheer, MD  insulin glargine (LANTUS) 100 UNIT/ML Solostar Pen Inject 10 Units into the skin daily. 02/09/21   Simmons-Robinson, [place],  MD  insulin glargine (LANTUS) 100 UNIT/ML Solostar Pen INJECT 10 UNITS INTO THE SKIN DAILY. 02/09/21 02/09/22  Simmons-Robinson, Riki Sheer, MD  Insulin Pen Needle 31G X 8 MM MISC USE AS DIRECTED 02/09/21 02/09/22  Simmons-Robinson, Riki Sheer, MD  losartan (COZAAR) 25 MG tablet Take 1 tablet (25 mg total) by mouth daily. 02/09/21 04/10/21  Simmons-Robinson, Makiera, MD  losartan (COZAAR)  25 MG tablet TAKE 1 TABLET (25 MG TOTAL) BY MOUTH DAILY. 02/09/21 02/09/22  Simmons-Robinson, Riki Sheer, MD  TRUEplus Lancets 28G MISC USE UP TO FOUR TIMES DAILY AS DIRECTED 02/09/21 02/09/22  Simmons-Robinson, Riki Sheer, MD  glipiZIDE (GLUCOTROL) 5 MG tablet Take 1 tablet (5 mg total) by mouth daily before breakfast. Patient not taking: Reported on 01/14/2016 01/31/14 01/14/16  Delfina Redwood, MD    Physical Exam: Vitals:   04/08/21 1929 04/08/21 2030  BP: (!) 129/105 (!) 143/91  Pulse: 94 89  Resp: 16 16  Temp: 98.4 F (36.9 C)   TempSrc: Oral   SpO2: 96% 94%  Weight: 104.3 kg   Height: 6' (1.829 m)     Physical Exam Constitutional:      General: He is not in acute distress. HENT:     Head: Normocephalic and atraumatic.  Eyes:     Extraocular Movements: Extraocular movements intact.     Conjunctiva/sclera: Conjunctivae normal.  Cardiovascular:     Rate and Rhythm: Normal rate and regular rhythm.     Pulses: Normal pulses.  Pulmonary:     Effort: Pulmonary effort is normal. No respiratory distress.     Breath sounds: Normal breath sounds. No wheezing or rales.  Abdominal:     General: Bowel sounds are normal. There is no distension.     Palpations: Abdomen is soft.     Tenderness: There is no abdominal tenderness.  Musculoskeletal:        General: Swelling present.     Cervical back: Normal range of motion and neck supple.     Comments: Significant swelling and erythema of the right foot with a wound on the plantar surface.  See images.  Skin:    General: Skin is warm and dry.  Neurological:     General: No focal deficit present.     Mental Status: He is alert and oriented to person, place, and time.             Labs on Admission: I have personally reviewed following labs and imaging studies  CBC: Recent Labs  Lab 04/08/21 1949  WBC 25.8*  NEUTROABS 21.1*  HGB 12.4*  HCT 36.8*  MCV 94.1  PLT 416   Basic Metabolic Panel: Recent Labs  Lab 04/08/21 1949   NA 131*  K 4.3  CL 99  CO2 21*  GLUCOSE 186*  BUN 31*  CREATININE 1.31*  CALCIUM 9.4   GFR: Estimated Creatinine Clearance: 76.8 mL/min (A) (by C-G formula based on SCr of 1.31 mg/dL (H)). Liver Function Tests: Recent Labs  Lab 04/08/21 1949  AST 22  ALT 33  ALKPHOS 149*  BILITOT 0.3  PROT 8.3*  ALBUMIN 3.0*   No results for input(s): LIPASE, AMYLASE in the last 168 hours. No results for input(s): AMMONIA in the last 168 hours. Coagulation Profile: No results for input(s): INR, PROTIME in the last 168 hours. Cardiac Enzymes: No results for input(s): CKTOTAL, CKMB, CKMBINDEX, TROPONINI in the last 168 hours. BNP (last 3 results) No results for input(s): PROBNP in the last 8760 hours. HbA1C: No results for input(s): HGBA1C in  the last 72 hours. CBG: No results for input(s): GLUCAP in the last 168 hours. Lipid Profile: No results for input(s): CHOL, HDL, LDLCALC, TRIG, CHOLHDL, LDLDIRECT in the last 72 hours. Thyroid Function Tests: No results for input(s): TSH, T4TOTAL, FREET4, T3FREE, THYROIDAB in the last 72 hours. Anemia Panel: No results for input(s): VITAMINB12, FOLATE, FERRITIN, TIBC, IRON, RETICCTPCT in the last 72 hours. Urine analysis:    Component Value Date/Time   COLORURINE YELLOW 02/07/2021 1521   APPEARANCEUR CLEAR 02/07/2021 1521   LABSPEC 1.014 02/07/2021 1521   PHURINE 5.0 02/07/2021 1521   GLUCOSEU 150 (A) 02/07/2021 1521   HGBUR SMALL (A) 02/07/2021 1521   BILIRUBINUR NEGATIVE 02/07/2021 1521   KETONESUR NEGATIVE 02/07/2021 1521   PROTEINUR NEGATIVE 02/07/2021 1521   NITRITE NEGATIVE 02/07/2021 1521   LEUKOCYTESUR NEGATIVE 02/07/2021 1521    Radiological Exams on Admission: DG Foot Complete Right  Result Date: 04/08/2021 CLINICAL DATA:  Stepped on a nail several weeks ago, open wound medial plantar aspect of right foot EXAM: RIGHT FOOT COMPLETE - 3+ VIEW COMPARISON:  02/07/2021 FINDINGS: Frontal, oblique, lateral views of the right foot  are obtained. There are no acute displaced fractures. Alignment is anatomic. No radiopaque foreign bodies. There is diffuse soft tissue swelling of the foot. Subcutaneous gas is seen within the plantar lateral aspect of the right foot. No destructive bony lesions or periosteal reaction to suggest osteomyelitis. IMPRESSION: 1. Diffuse soft tissue swelling, with subcutaneous gas plantar lateral aspect right foot. 2. No acute or destructive bony lesions. 3. No radiopaque foreign body. Electronically Signed   By: Randa Ngo M.D.   On: 04/08/2021 20:54    Assessment/Plan Principal Problem:   Diabetic foot infection ([place]) Active Problems:   DM (diabetes mellitus), type 2, uncontrolled ([facility])   Alcohol abuse   HTN (hypertension)   HLD (hyperlipidemia)   Diabetic foot infection Does have leukocytosis on labs but no fever, tachycardia, or lactic acidosis to suggest sepsis. X-ray of right foot showing diffuse soft tissue swelling with subcutaneous gas at the plantar lateral aspect of the foot.  No signs of osteomyelitis. -Given severity of infection, will cover with broad-spectrum antibiotics at this time including vancomycin, Zosyn, and clindamycin.  ESR and CRP pending.  MRI ordered to rule out osteomyelitis.  Continue to monitor WBC count.  Second set of lactate pending.  Blood culture x2 pending.  Orthopedics consulted.  Poorly controlled insulin-dependent type 2 diabetes Due to noncompliance.  He is not taking any medications at home and has not followed up with his PCP since his hospital discharge at the beginning of April last month.  A1c 10.5 on 02/07/2021. -Start Lantus 5 units daily.  Sliding scale insulin sensitive. Addendum: Consult diabetes coordinator.  Hypertension Not taking any meds.  Currently normotensive. -Monitor blood pressure closely and start antihypertensive if needed.  Hyperlipidemia Not taking his statin. -Check lipid panel Addendum: Lipid panel done 02/08/2021 showing  LDL 152.  Resume Lipitor 40 mg daily.  History of alcohol abuse No signs of withdrawal at this time. -CIWA protocol; Ativan as needed.  Thiamine, folate, and multivitamin.  Check mag and Phos levels.  DVT prophylaxis: SCDs at this time, pending MRI Code Status: Full code Family Communication: No family at bedside. Disposition Plan: Status is: Inpatient  Remains inpatient appropriate because:Inpatient level of care appropriate due to severity of illness   Dispo: The patient is from: Home              Anticipated d/c is  to: Home              Patient currently is not medically stable to d/c.   Difficult to place patient No  Level of care: Level of care: Med-Surg   The medical decision making on this patient was of high complexity and the patient is at high risk for clinical deterioration, therefore this is a level 3 visit.  Shela Leff MD [facility]  If 7PM-7AM, please contact night-coverage [email or web address]  04/08/2021, 10:10 PM

## 2021-04-09 LAB — C-REACTIVE PROTEIN: CRP: 27.2 mg/dL — ABNORMAL HIGH (ref <1.0)

## 2021-04-09 LAB — SARS CORONAVIRUS 2 (TAT 6-24 HRS): SARS Coronavirus 2: NEGATIVE

## 2021-04-09 NOTE — Progress Notes (Signed)
Informed by ED RN that the patient wants to leave AGAINST MEDICAL ADVICE.  I had a very lengthy conversation with the patient and explained to him that leaving the hospital would be AGAINST MEDICAL ADVICE.  I have explained to him repeatedly that I am very concerned about his foot infection and without treatment the infection will continue to get worse and can result in him losing his foot and possibly even death.  Patient has expressed his understanding but continues to adamantly refuse any further treatment for his foot infection and wants to leave the hospital AGAINST MEDICAL ADVICE.  He is AAO x4 and has decision-making capacity.

## 2021-04-09 NOTE — ED Notes (Signed)
Pt states that he wants to leave AMA. I paged the hospitalist to speak with him, and I informed him that he has a bad infection in his foot and there is a good chance that he will lose his foot if he leaves against medical advice. Pt states that he understands the risks and he will be leaving AMA. Pt signed AMA forms and got re-dressed.

## 2021-04-14 ENCOUNTER — Inpatient Hospital Stay (HOSPITAL_COMMUNITY): Payer: Self-pay

## 2021-04-14 ENCOUNTER — Inpatient Hospital Stay (HOSPITAL_COMMUNITY)
Admission: EM | Admit: 2021-04-14 | Discharge: 2021-04-15 | DRG: 602 | Discharge status: Against Medical Advice | Payer: Self-pay | Attending: Internal Medicine | Admitting: Internal Medicine

## 2021-04-14 ENCOUNTER — Encounter (HOSPITAL_COMMUNITY): Payer: Self-pay

## 2021-04-14 ENCOUNTER — Other Ambulatory Visit: Payer: Self-pay

## 2021-04-14 ENCOUNTER — Emergency Department (HOSPITAL_COMMUNITY): Payer: Self-pay

## 2021-04-14 DIAGNOSIS — I959 Hypotension, unspecified: Secondary | ICD-10-CM | POA: Diagnosis present

## 2021-04-14 DIAGNOSIS — Z20822 Contact with and (suspected) exposure to covid-19: Secondary | ICD-10-CM | POA: Diagnosis present

## 2021-04-14 DIAGNOSIS — L089 Local infection of the skin and subcutaneous tissue, unspecified: Principal | ICD-10-CM | POA: Diagnosis present

## 2021-04-14 DIAGNOSIS — Z9119 Patient's noncompliance with other medical treatment and regimen: Secondary | ICD-10-CM

## 2021-04-14 DIAGNOSIS — F1010 Alcohol abuse, uncomplicated: Secondary | ICD-10-CM | POA: Diagnosis present

## 2021-04-14 DIAGNOSIS — N179 Acute kidney failure, unspecified: Secondary | ICD-10-CM | POA: Diagnosis present

## 2021-04-14 DIAGNOSIS — E785 Hyperlipidemia, unspecified: Secondary | ICD-10-CM | POA: Diagnosis present

## 2021-04-14 DIAGNOSIS — A480 Gas gangrene: Secondary | ICD-10-CM | POA: Diagnosis present

## 2021-04-14 DIAGNOSIS — E11621 Type 2 diabetes mellitus with foot ulcer: Secondary | ICD-10-CM | POA: Diagnosis present

## 2021-04-14 DIAGNOSIS — Z794 Long term (current) use of insulin: Secondary | ICD-10-CM

## 2021-04-14 DIAGNOSIS — Z79899 Other long term (current) drug therapy: Secondary | ICD-10-CM

## 2021-04-14 DIAGNOSIS — Z7984 Long term (current) use of oral hypoglycemic drugs: Secondary | ICD-10-CM

## 2021-04-14 DIAGNOSIS — Z5329 Procedure and treatment not carried out because of patient's decision for other reasons: Secondary | ICD-10-CM | POA: Diagnosis present

## 2021-04-14 DIAGNOSIS — M869 Osteomyelitis, unspecified: Secondary | ICD-10-CM

## 2021-04-14 DIAGNOSIS — M86271 Subacute osteomyelitis, right ankle and foot: Secondary | ICD-10-CM

## 2021-04-14 DIAGNOSIS — E1152 Type 2 diabetes mellitus with diabetic peripheral angiopathy with gangrene: Secondary | ICD-10-CM | POA: Diagnosis present

## 2021-04-14 DIAGNOSIS — E1165 Type 2 diabetes mellitus with hyperglycemia: Secondary | ICD-10-CM | POA: Diagnosis present

## 2021-04-14 DIAGNOSIS — I10 Essential (primary) hypertension: Secondary | ICD-10-CM | POA: Diagnosis present

## 2021-04-14 DIAGNOSIS — E861 Hypovolemia: Secondary | ICD-10-CM | POA: Diagnosis present

## 2021-04-14 DIAGNOSIS — L97519 Non-pressure chronic ulcer of other part of right foot with unspecified severity: Secondary | ICD-10-CM | POA: Diagnosis present

## 2021-04-14 HISTORY — DX: Osteomyelitis, unspecified: M86.9

## 2021-04-14 LAB — COMPREHENSIVE METABOLIC PANEL
ALT: 31 U/L (ref 0–44)
AST: 21 U/L (ref 15–41)
Albumin: 2.9 g/dL — ABNORMAL LOW (ref 3.5–5.0)
Alkaline Phosphatase: 108 U/L (ref 38–126)
Anion gap: 10 (ref 5–15)
BUN: 30 mg/dL — ABNORMAL HIGH (ref 6–20)
CO2: 22 mmol/L (ref 22–32)
Calcium: 9.5 mg/dL (ref 8.9–10.3)
Chloride: 100 mmol/L (ref 98–111)
Creatinine, Ser: 1.68 mg/dL — ABNORMAL HIGH (ref 0.61–1.24)
GFR, Estimated: 47 mL/min — ABNORMAL LOW (ref >60)
Glucose, Bld: 259 mg/dL — ABNORMAL HIGH (ref 70–99)
Potassium: 5 mmol/L (ref 3.5–5.1)
Sodium: 132 mmol/L — ABNORMAL LOW (ref 135–145)
Total Bilirubin: 0.6 mg/dL (ref 0.3–1.2)
Total Protein: 8.3 g/dL — ABNORMAL HIGH (ref 6.5–8.1)

## 2021-04-14 LAB — LACTIC ACID, PLASMA
Lactic Acid, Venous: 1.3 mmol/L (ref 0.5–1.9)
Lactic Acid, Venous: 1.2 mmol/L (ref 0.5–1.9)

## 2021-04-14 LAB — CBC
HCT: 41 % (ref 39.0–52.0)
Hemoglobin: 13.2 g/dL (ref 13.0–17.0)
MCH: 31.8 pg (ref 26.0–34.0)
MCHC: 32.2 g/dL (ref 30.0–36.0)
MCV: 98.8 fL (ref 80.0–100.0)
Platelets: 349 10*3/uL (ref 150–400)
RBC: 4.15 MIL/uL — ABNORMAL LOW (ref 4.22–5.81)
RDW: 12.6 % (ref 11.5–15.5)
WBC: 11.3 10*3/uL — ABNORMAL HIGH (ref 4.0–10.5)
nRBC: 0 % (ref 0.0–0.2)

## 2021-04-14 LAB — CBC WITH DIFFERENTIAL/PLATELET
Abs Immature Granulocytes: 0.16 10*3/uL — ABNORMAL HIGH (ref 0.00–0.07)
Basophils Absolute: 0.1 10*3/uL (ref 0.0–0.1)
Basophils Relative: 0 %
Eosinophils Absolute: 0.1 10*3/uL (ref 0.0–0.5)
Eosinophils Relative: 1 %
HCT: 39.3 % (ref 39.0–52.0)
Hemoglobin: 13 g/dL (ref 13.0–17.0)
Immature Granulocytes: 1 %
Lymphocytes Relative: 17 %
Lymphs Abs: 2 10*3/uL (ref 0.7–4.0)
MCH: 31.6 pg (ref 26.0–34.0)
MCHC: 33.1 g/dL (ref 30.0–36.0)
MCV: 95.4 fL (ref 80.0–100.0)
Monocytes Absolute: 1.1 10*3/uL — ABNORMAL HIGH (ref 0.1–1.0)
Monocytes Relative: 9 %
Neutro Abs: 8.6 10*3/uL — ABNORMAL HIGH (ref 1.7–7.7)
Neutrophils Relative %: 72 %
Platelets: 371 10*3/uL (ref 150–400)
RBC: 4.12 MIL/uL — ABNORMAL LOW (ref 4.22–5.81)
RDW: 12.4 % (ref 11.5–15.5)
WBC: 12.1 10*3/uL — ABNORMAL HIGH (ref 4.0–10.5)
nRBC: 0 % (ref 0.0–0.2)

## 2021-04-14 LAB — CULTURE, BLOOD (ROUTINE X 2)
Culture: NO GROWTH
Culture: NO GROWTH
Special Requests: ADEQUATE
Special Requests: ADEQUATE

## 2021-04-14 LAB — MAGNESIUM: Magnesium: 1.9 mg/dL (ref 1.7–2.4)

## 2021-04-14 LAB — GLUCOSE, CAPILLARY: Glucose-Capillary: 158 mg/dL — ABNORMAL HIGH (ref 70–99)

## 2021-04-14 LAB — PHOSPHORUS: Phosphorus: 4.3 mg/dL (ref 2.5–4.6)

## 2021-04-14 LAB — RESP PANEL BY RT-PCR (FLU A&B, COVID) ARPGX2
Influenza A by PCR: NEGATIVE
Influenza B by PCR: NEGATIVE
SARS Coronavirus 2 by RT PCR: NEGATIVE

## 2021-04-14 LAB — CBG MONITORING, ED: Glucose-Capillary: 212 mg/dL — ABNORMAL HIGH (ref 70–99)

## 2021-04-14 IMAGING — DX DG FOOT COMPLETE 3+V*R*
2 series · 2 of 2 positions shown · non-contrast
Comparison: [DATE]

CLINICAL DATA: Diabetic foot infection, ongoing pain and swelling
RIGHT foot after stepping on a nail, tried to drain pus, odor, pain
and swelling noted, patient denies fever

EXAM:
RIGHT FOOT COMPLETE - 3+ VIEW

[foot]
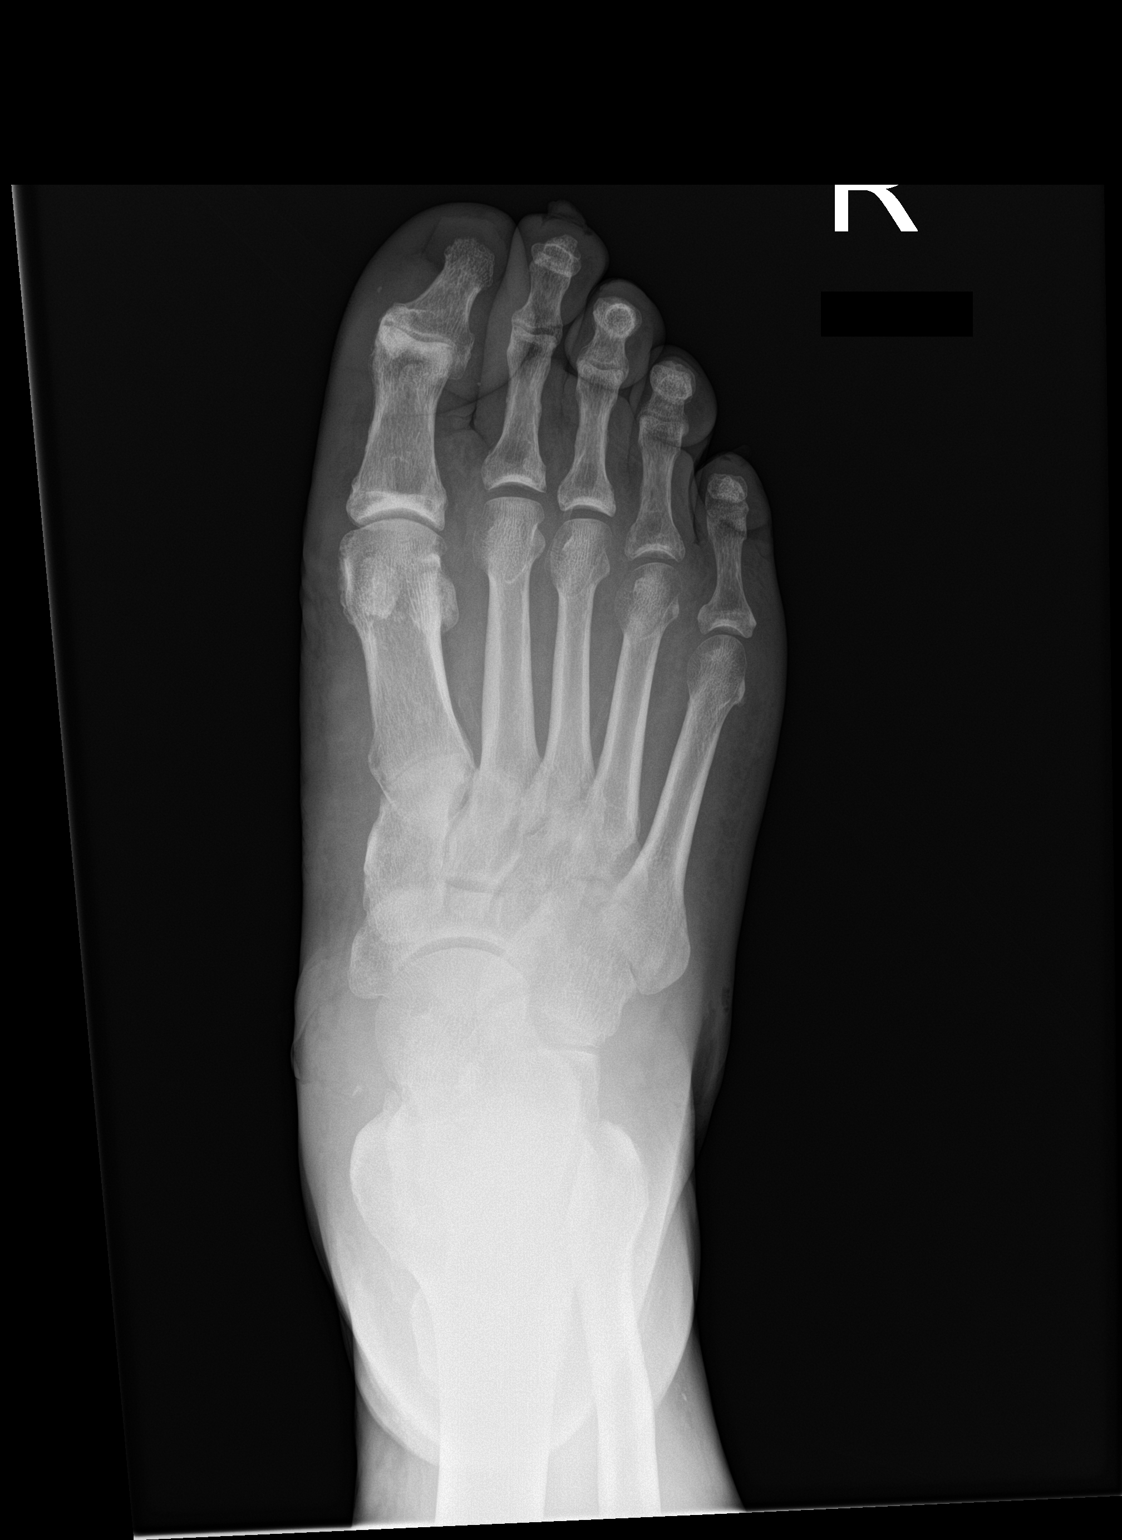

[leg]
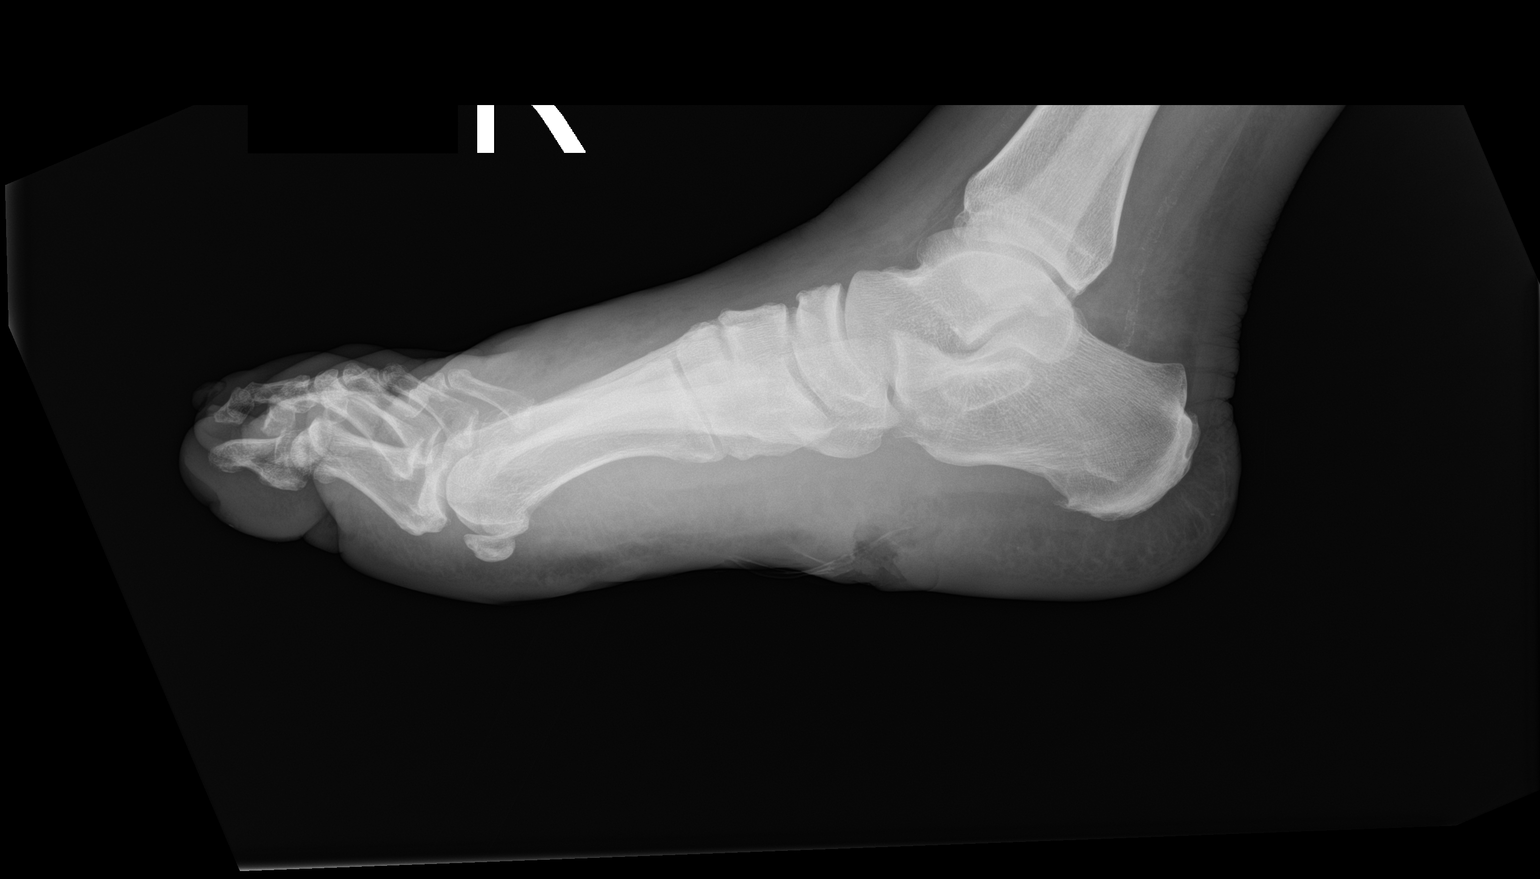

[2 of 2 positions shown; findings below may reference images not displayed]

FINDINGS: Soft tissue swelling RIGHT foot.

Osseous mineralization low normal.

Joint spaces preserved.

Tiny plantar calcaneal spur.

Soft tissue defect at mid plantar arch question soft tissue wound.

No acute fracture, dislocation, or bone destruction.

Small vessel vascular calcifications at posterior tibial artery.
IMPRESSION: Soft tissue swelling with ulcer at mid RIGHT plantar arch.

No acute osseous abnormalities.

## 2021-04-14 IMAGING — DX DG CHEST 1V PORT
1 series · 1 of 1 positions shown · non-contrast
Comparison: Portable exam [MI] hours compared to [DATE]

CLINICAL DATA: Weakness, swelling RIGHT foot, diabetic foot
infection

EXAM:
PORTABLE CHEST 1 VIEW

[chest]
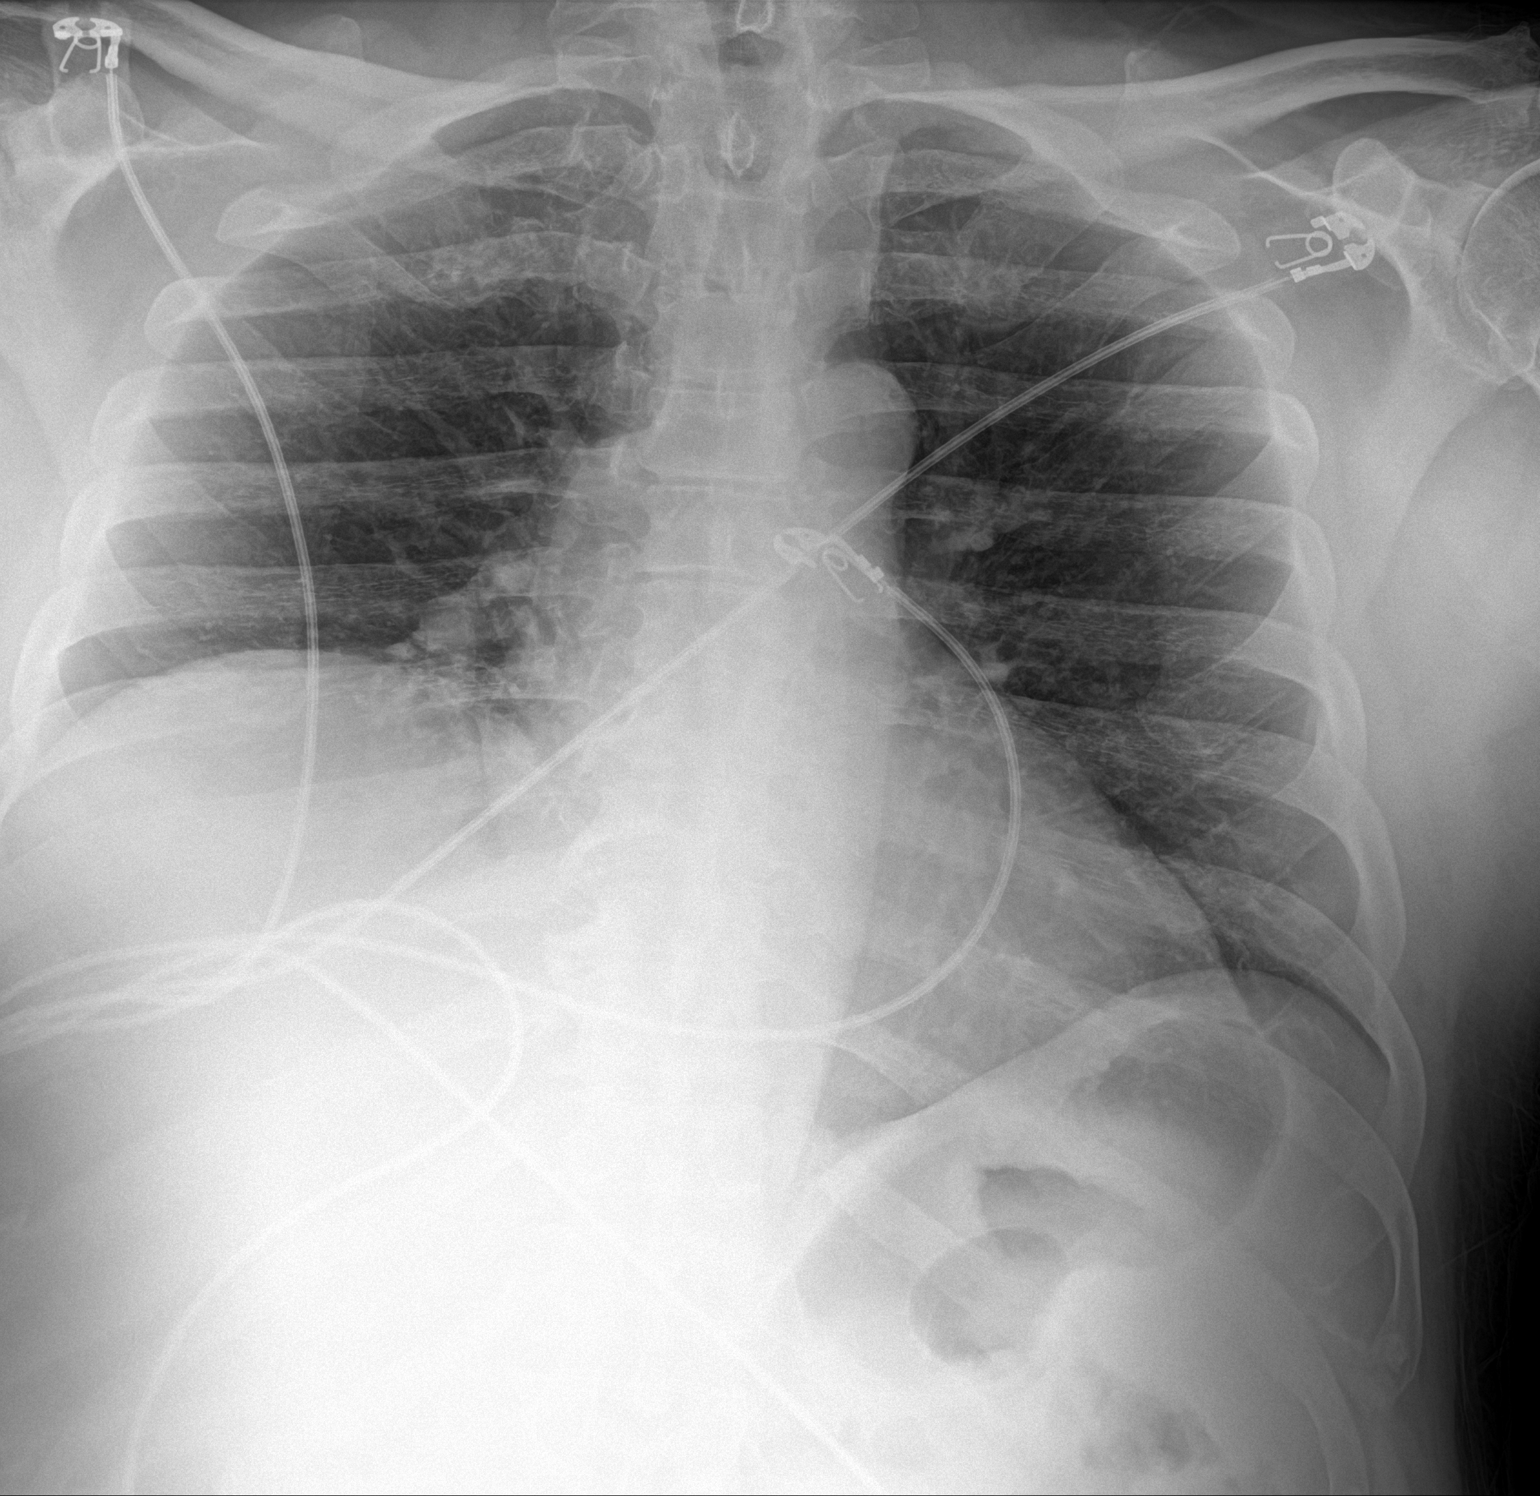

[1 of 1 positions shown; findings below may reference images not displayed]

FINDINGS: Upper normal heart size.

Mediastinal contours and pulmonary vascularity normal.

Chronic elevation of RIGHT diaphragm with minimal RIGHT basilar
atelectasis.

Remaining lungs clear.

No infiltrate, pleural effusion or pneumothorax.
IMPRESSION: Chronic elevation of RIGHT diaphragm with minimal RIGHT basilar
atelectasis.

No acute abnormalities.

## 2021-04-14 IMAGING — MR MR FOOT*R* W/O CM
4 of 6 series · 19 of 40 positions shown · non-contrast
Comparison: Radiograph [DATE]

CLINICAL DATA: Pain and drainage from the right foot, possible
osteomyelitis

EXAM:
MRI OF THE RIGHT FOREFOOT WITHOUT CONTRAST
TECHNIQUE: Multiplanar, multisequence MR imaging of the right forefoot was
performed. No intravenous contrast was administered.

[Series 4: T2 fat-sat · coronal · 3.0mm · 0.27mm/px · 8 of 47 slices shown (1 of 3)]
[im 1/47]
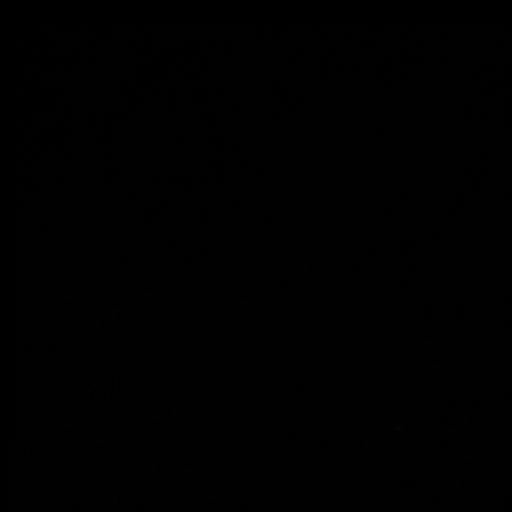
[im 7/47]
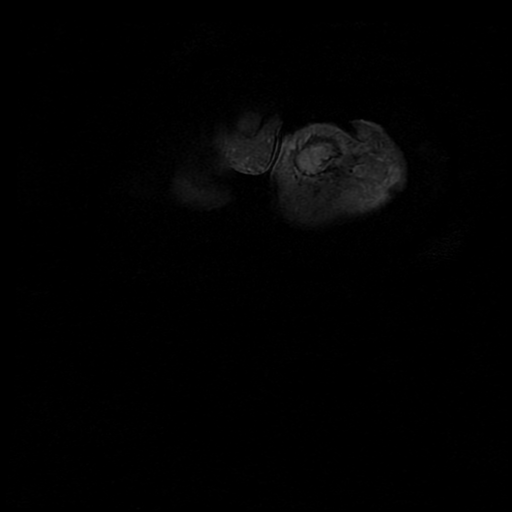
[im 14/47]
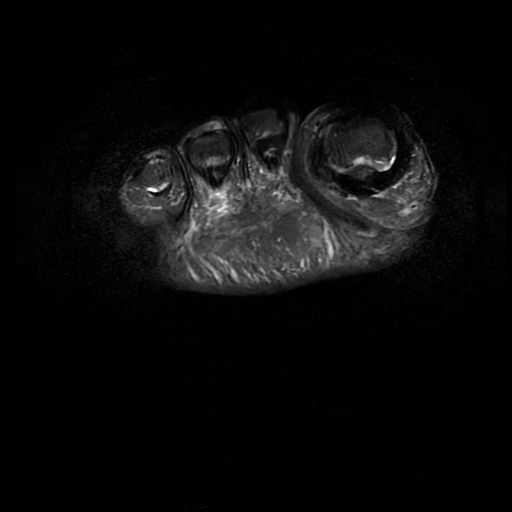
[im 20/47]
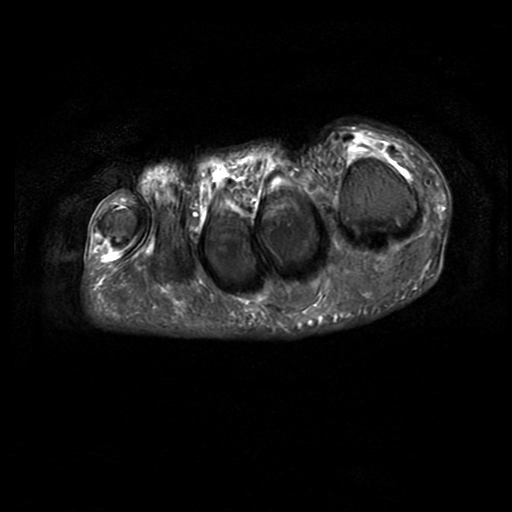
[im 27/47]
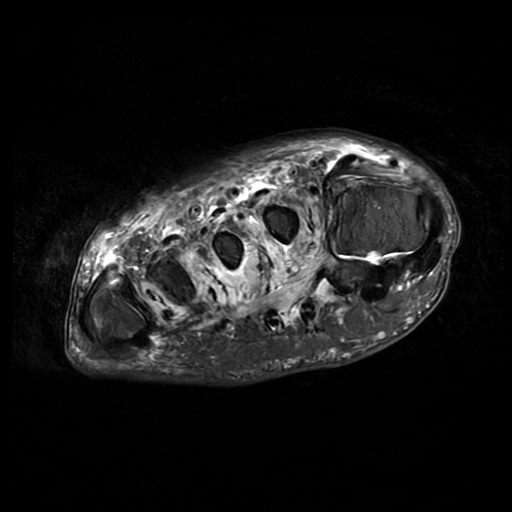
[im 33/47]
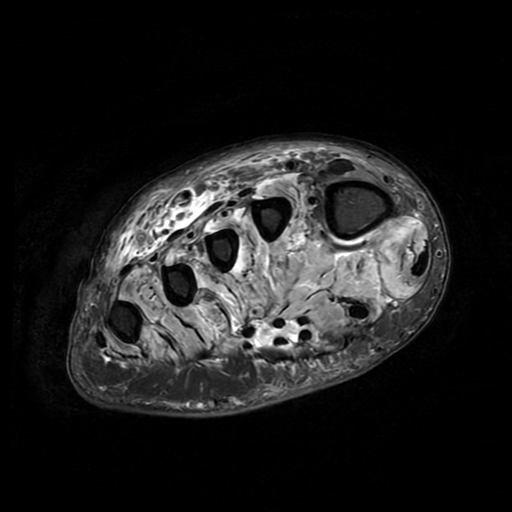
[im 40/47]
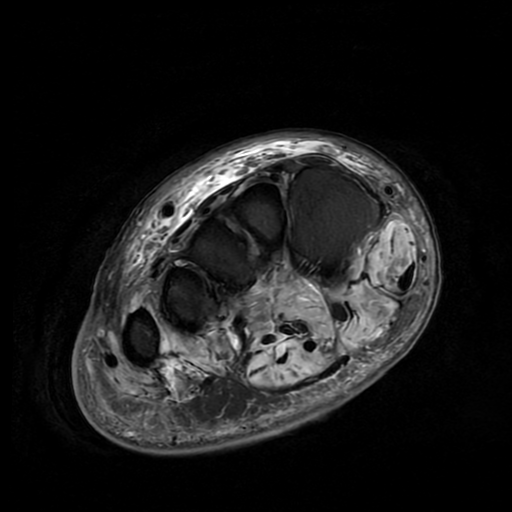
[im 47/47]
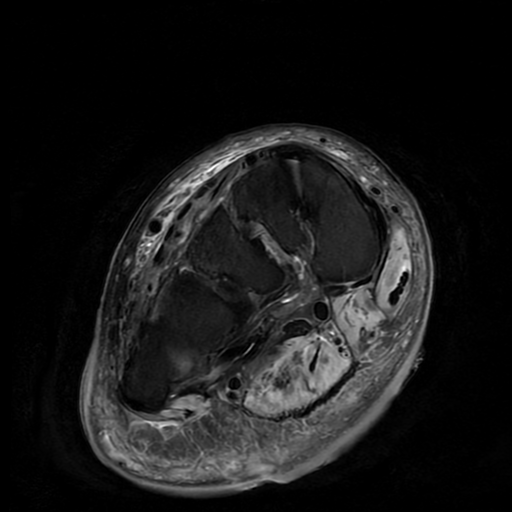

[Series 5: T1 · coronal · 3.0mm · 0.27mm/px · 3 of 47 slices shown]
[im 7/47]
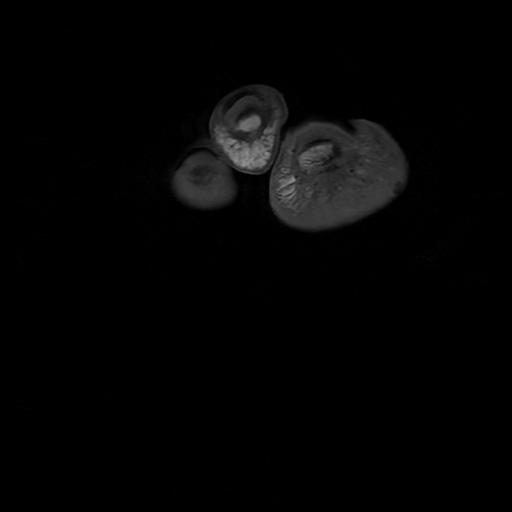
[im 27/47]
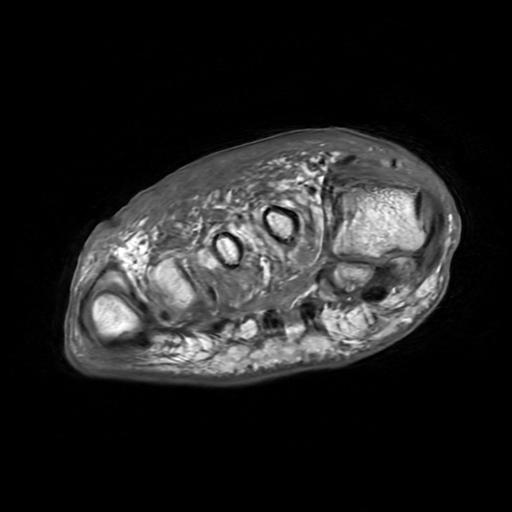
[im 40/47]
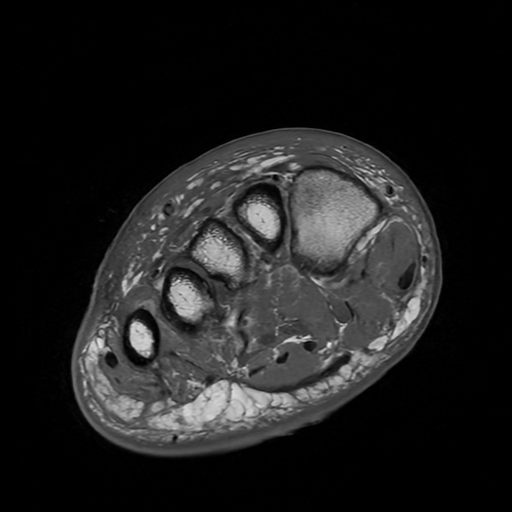

[Series 7: T2 fat-sat · oblique · 3.0mm · 0.39mm/px · 5 of 34 slices shown (2 of 3)]
[im 1/34]
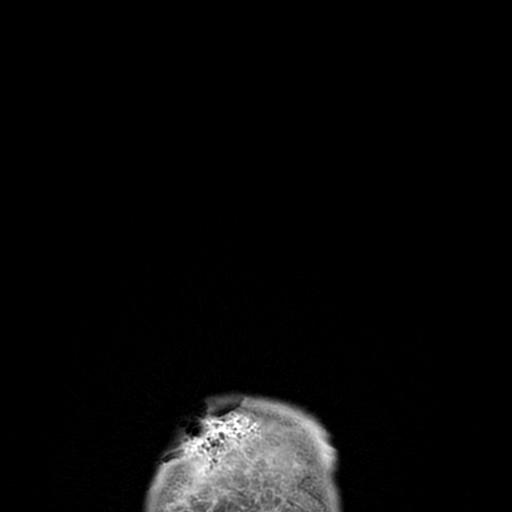
[im 7/34]
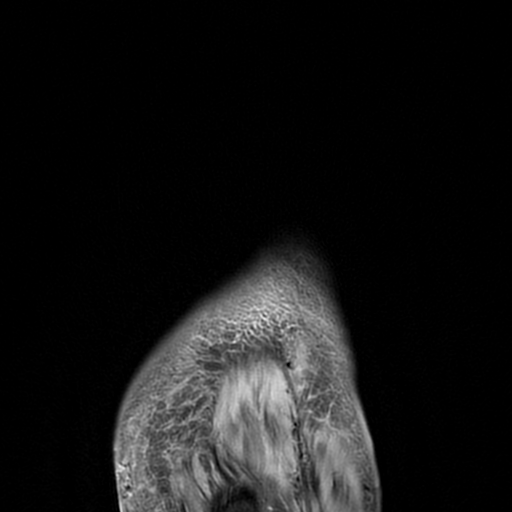
[im 14/34]
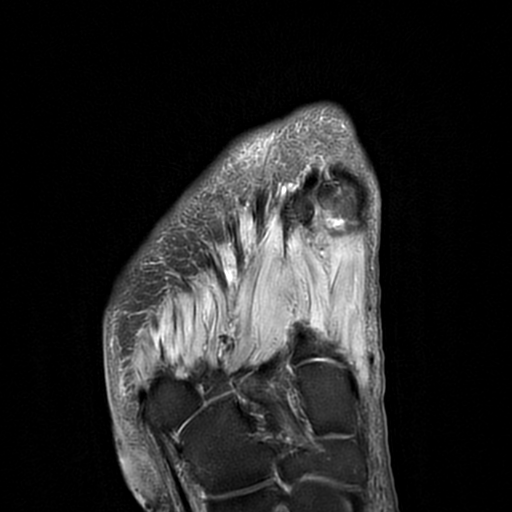
[im 20/34]
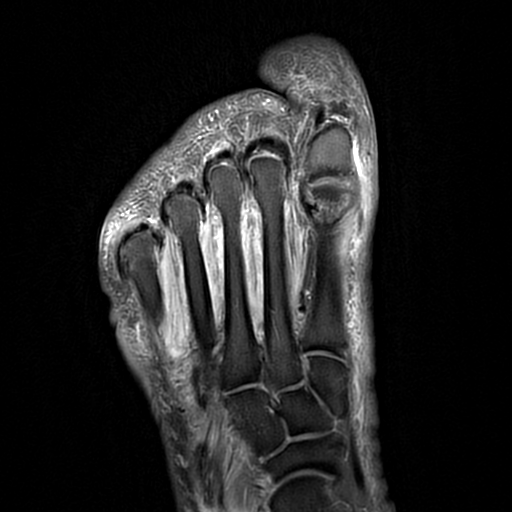
[im 34/34]
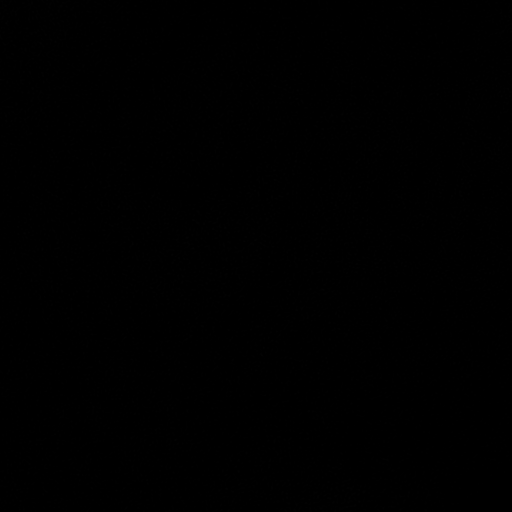

[Series 8: T2 fat-sat · sagittal · 3.0mm · 0.39mm/px · 3 of 33 slices shown (3 of 3)]
[im 7/33]
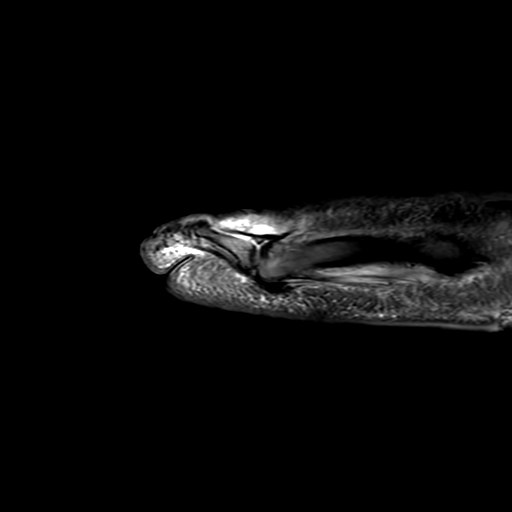
[im 20/33]
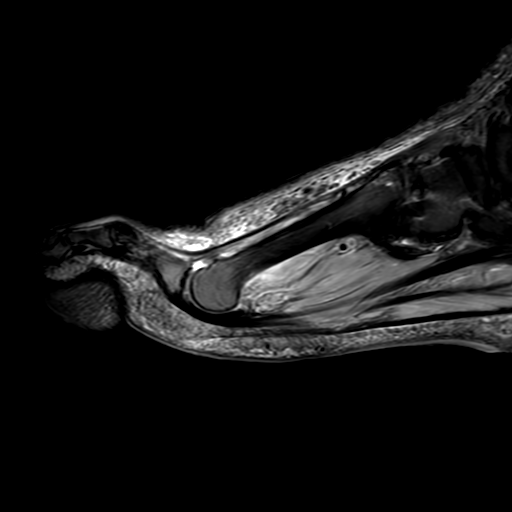
[im 33/33]
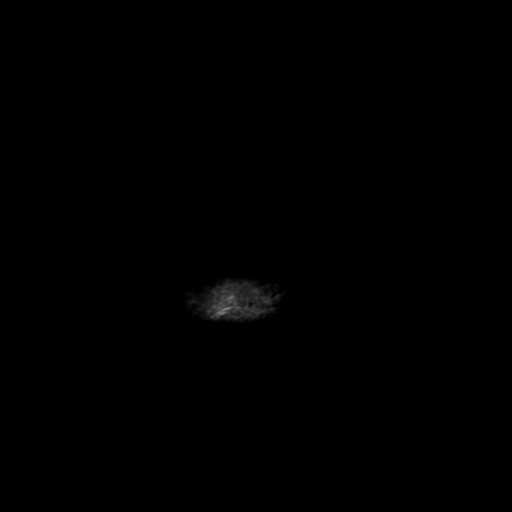

[19 of 40 positions shown; findings below may reference images not displayed]

FINDINGS: Bones/Joint/Cartilage

Subtle accentuated T2 signal medially in the medial sesamoid of the
first digit, probably degenerative. No compelling findings of
osteomyelitis.

Ligaments

The Lisfranc ligament appears intact.

Muscles and Tendons

Low-level edema tracks in the visualized musculature of the foot in
a diffuse fashion and may be neurogenic. No drainable abscess.

Soft tissues

Plantar cutaneous ulceration at about the level of the midfoot with
gas in the adjacent subcutaneous tissues. This is at the proximal
margin of imaging, and extends around the lateral side of the
midfoot in the subcutaneous tissues were there may be some
blistering as well.

Subcutaneous edema in the forefoot especially dorsally, extending
into the toes, cellulitis is a distinct possibility.
IMPRESSION: 1. No findings of osteomyelitis or drainable abscess.
2. Plantar ulceration below the midfoot and tracking around the
lateral portion of the midfoot in the subcutaneous tissues, with
some scattered gas in the subcutaneous tissues.
3. Low-level edema tracking in the musculature of the foot, quite
likely neurogenic.
4. Subcutaneous edema in the foot, especially dorsally, potentially
cellulitis.

## 2021-04-14 MED ORDER — SODIUM CHLORIDE 0.9 % IV SOLN: INTRAVENOUS | Status: DC

## 2021-04-14 MED ORDER — VANCOMYCIN HCL 1250 MG/250ML IV SOLN: 1250.0000 mg | INTRAVENOUS | Status: DC

## 2021-04-14 MED ORDER — VANCOMYCIN HCL 1500 MG/300ML IV SOLN
1500.0000 mg | Freq: Once | INTRAVENOUS | Status: AC
  Administered 2021-04-14: 1500 mg via INTRAVENOUS
  Filled 2021-04-14: qty 300

## 2021-04-14 MED ORDER — THIAMINE HCL 100 MG/ML IJ SOLN: 100.0000 mg | Freq: Every day | INTRAMUSCULAR | Status: DC

## 2021-04-14 MED ORDER — PIPERACILLIN-TAZOBACTAM 3.375 G IVPB 30 MIN
3.3750 g | Freq: Once | INTRAVENOUS | Status: AC
  Administered 2021-04-14: 3.375 g via INTRAVENOUS
  Filled 2021-04-14: qty 50

## 2021-04-14 MED ORDER — PIPERACILLIN-TAZOBACTAM 3.375 G IVPB
3.3750 g | Freq: Three times a day (TID) | INTRAVENOUS | Status: DC
  Administered 2021-04-14: 3.375 g via INTRAVENOUS
  Filled 2021-04-14: qty 50

## 2021-04-14 MED ORDER — ADULT MULTIVITAMIN W/MINERALS CH
1.0000 | ORAL_TABLET | Freq: Every day | ORAL | Status: DC
  Administered 2021-04-14: 1 via ORAL
  Filled 2021-04-14: qty 1

## 2021-04-14 MED ORDER — FOLIC ACID 1 MG PO TABS
1.0000 mg | ORAL_TABLET | Freq: Every day | ORAL | Status: DC
  Administered 2021-04-14: 1 mg via ORAL
  Filled 2021-04-14: qty 1

## 2021-04-14 MED ORDER — ONDANSETRON HCL 4 MG PO TABS: 4.0000 mg | ORAL_TABLET | Freq: Four times a day (QID) | ORAL | Status: DC | PRN

## 2021-04-14 MED ORDER — INSULIN ASPART 100 UNIT/ML IJ SOLN
0.0000 [IU] | Freq: Three times a day (TID) | INTRAMUSCULAR | Status: DC
  Administered 2021-04-14: 5 [IU] via SUBCUTANEOUS

## 2021-04-14 MED ORDER — ATORVASTATIN CALCIUM 40 MG PO TABS: 40.0000 mg | ORAL_TABLET | Freq: Every day | ORAL | Status: DC

## 2021-04-14 MED ORDER — ACETAMINOPHEN 650 MG RE SUPP: 650.0000 mg | Freq: Four times a day (QID) | RECTAL | Status: DC | PRN

## 2021-04-14 MED ORDER — ONDANSETRON HCL 4 MG/2ML IJ SOLN: 4.0000 mg | Freq: Four times a day (QID) | INTRAMUSCULAR | Status: DC | PRN

## 2021-04-14 MED ORDER — SODIUM CHLORIDE 0.9 % IV BOLUS
1000.0000 mL | Freq: Once | INTRAVENOUS | Status: AC
  Administered 2021-04-14: 1000 mL via INTRAVENOUS

## 2021-04-14 MED ORDER — ACETAMINOPHEN 325 MG PO TABS: 650.0000 mg | ORAL_TABLET | Freq: Four times a day (QID) | ORAL | Status: DC | PRN

## 2021-04-14 MED ORDER — SENNOSIDES-DOCUSATE SODIUM 8.6-50 MG PO TABS: 1.0000 | ORAL_TABLET | Freq: Every evening | ORAL | Status: DC | PRN

## 2021-04-14 MED ORDER — RISAQUAD PO CAPS
2.0000 | ORAL_CAPSULE | Freq: Three times a day (TID) | ORAL | Status: DC
  Administered 2021-04-14: 2 via ORAL
  Filled 2021-04-14 (×2): qty 2

## 2021-04-14 MED ORDER — LORAZEPAM 2 MG/ML IJ SOLN: 1.0000 mg | INTRAMUSCULAR | Status: DC | PRN

## 2021-04-14 MED ORDER — LORAZEPAM 1 MG PO TABS: 1.0000 mg | ORAL_TABLET | ORAL | Status: DC | PRN

## 2021-04-14 MED ORDER — ENOXAPARIN SODIUM 40 MG/0.4ML IJ SOSY
40.0000 mg | PREFILLED_SYRINGE | INTRAMUSCULAR | Status: DC
  Administered 2021-04-14: 40 mg via SUBCUTANEOUS
  Filled 2021-04-14: qty 0.4

## 2021-04-14 MED ORDER — INSULIN GLARGINE 100 UNIT/ML ~~LOC~~ SOLN
10.0000 [IU] | Freq: Every day | SUBCUTANEOUS | Status: DC
  Administered 2021-04-14: 10 [IU] via SUBCUTANEOUS
  Filled 2021-04-14: qty 0.1

## 2021-04-14 MED ORDER — THIAMINE HCL 100 MG PO TABS
100.0000 mg | ORAL_TABLET | Freq: Every day | ORAL | Status: DC
  Administered 2021-04-14: 100 mg via ORAL
  Filled 2021-04-14: qty 1

## 2021-04-14 NOTE — ED Provider Notes (Signed)
[place] EMERGENCY DEPARTMENT Provider Note   CSN: [id number] Arrival date & time: 04/14/21  1208     History No chief complaint on file.   KENRIC GINGER is a 60 y.o. male.  60 year old male with prior medical history as detailed below presents for evaluation.  Patient complains of persistent pain and drainage from his right foot.  Patient was admitted on 5/29 for treatment of same infection.  Patient left AMA shortly after admission.  Infection appears to have started after the patient stepped on a nail per report.  He reports trying to drain the infection at home with use of a nail or pin.  Today patient reports to this provider that he plans on staying.  Patient reports increased drainage from his right foot.  He reports purulent drainage with walking.  He denies fever.  He denies other complaint.  Tetanus was updated at last visit.  The history is provided by the patient and medical records.  Illness Location:  Foot infection Severity:  Moderate Onset quality:  Gradual Duration:  3 weeks Timing:  Constant Progression:  Worsening Associated symptoms: no fever        History reviewed. No pertinent past medical history.  Patient Active Problem List   Diagnosis Date Noted  . Diabetic foot infection ([place]) 04/08/2021  . HTN (hypertension) 04/08/2021  . HLD (hyperlipidemia) 04/08/2021  . Alcohol abuse   . Cellulitis   . Bilateral leg edema 02/07/2021  . Anasarca   . 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 2 diabetes mellitus, limited to breakdown of skin ([place])   . Hypokalemia 01/28/2014  . DM (diabetes mellitus), type 2, uncontrolled ([place]) 01/27/2014  . AKI (acute kidney injury) ([place]) 01/27/2014  . Leukocytosis, unspecified 01/27/2014  . Facial cellulitis 01/26/2014  . Dental abscess 01/26/2014  . Infected dental carries 01/26/2014  . Cellulitis and abscess 01/26/2014    History reviewed. No pertinent surgical  history.     No family history on file.  Social History   Tobacco Use  . Smoking status: Never Smoker  . Smokeless tobacco: Never Used  Substance Use Topics  . Alcohol use: Yes    Alcohol/week: 2.0 standard drinks    Types: 2 Cans of beer per week    Comment: Occasional  . Drug use: No    Home Medications Prior to Admission medications   Medication Sig Start Date End Date Taking? Authorizing Provider  atorvastatin (LIPITOR) 40 MG tablet Take 1 tablet (40 mg total) by mouth daily. 02/10/21   Simmons-Robinson, Makiera, MD  atorvastatin (LIPITOR) 40 MG tablet TAKE 1 TABLET (40 MG TOTAL) BY MOUTH DAILY. 02/09/21 02/09/22  Simmons-Robinson, Riki Sheer, MD  blood glucose meter kit and supplies Dispense based on patient and insurance preference. Use up to four times daily as directed. (FOR ICD-10 E10.9, E11.9). 02/09/21   Simmons-Robinson, Riki Sheer, MD  Blood Glucose Monitoring Suppl (TRUE METRIX METER) w/Device KIT USE UP TO FOUR TIMES DAILY AS DIRECTED. (FOR ICD-10 E10.9, E11.9). 02/09/21 02/09/22  Simmons-Robinson, Riki Sheer, MD  doxycycline (VIBRA-TABS) 100 MG tablet TAKE 1 TABLET (100 MG TOTAL) BY MOUTH DAILY FOR 3 DAYS. 02/09/21 02/09/22  Simmons-Robinson, Makiera, MD  glucose blood test strip USE UP TO FOUR TIMES DAILY AS DIRECTED 02/09/21 02/09/22  Simmons-Robinson, Riki Sheer, MD  insulin glargine (LANTUS) 100 UNIT/ML Solostar Pen Inject 10 Units into the skin daily. 02/09/21   Simmons-Robinson, Makiera, MD  insulin glargine (LANTUS) 100 UNIT/ML Solostar Pen INJECT 10 UNITS INTO THE  SKIN DAILY. 02/09/21 02/09/22  Simmons-Robinson, Riki Sheer, MD  Insulin Pen Needle 31G X 8 MM MISC USE AS DIRECTED 02/09/21 02/09/22  Simmons-Robinson, Riki Sheer, MD  losartan (COZAAR) 25 MG tablet Take 1 tablet (25 mg total) by mouth daily. 02/09/21 04/10/21  Simmons-Robinson, Makiera, MD  losartan (COZAAR) 25 MG tablet TAKE 1 TABLET (25 MG TOTAL) BY MOUTH DAILY. 02/09/21 02/09/22  Simmons-Robinson, Riki Sheer, MD  TRUEplus Lancets 28G MISC USE UP TO  FOUR TIMES DAILY AS DIRECTED 02/09/21 02/09/22  Simmons-Robinson, Riki Sheer, MD  glipiZIDE (GLUCOTROL) 5 MG tablet Take 1 tablet (5 mg total) by mouth daily before breakfast. Patient not taking: Reported on 01/14/2016 01/31/14 01/14/16  Delfina Redwood, MD    Allergies    Patient has no known allergies.  Review of Systems   Review of Systems  Constitutional: Negative for fever.  All other systems reviewed and are negative.   Physical Exam Updated Vital Signs BP 102/71 (BP Location: Left Arm)   Pulse 91   Temp 98.1 F (36.7 C) (Oral)   Resp 17   SpO2 98%   Physical Exam Vitals and nursing note reviewed.  Constitutional:      General: He is not in acute distress.    Appearance: Normal appearance. He is well-developed.  HENT:     Head: Normocephalic and atraumatic.  Eyes:     Conjunctiva/sclera: Conjunctivae normal.     Pupils: Pupils are equal, round, and reactive to light.  Cardiovascular:     Rate and Rhythm: Normal rate and regular rhythm.     Heart sounds: Normal heart sounds.  Pulmonary:     Effort: Pulmonary effort is normal. No respiratory distress.     Breath sounds: Normal breath sounds.  Abdominal:     General: There is no distension.     Palpations: Abdomen is soft.     Tenderness: There is no abdominal tenderness.  Musculoskeletal:        General: No deformity. Normal range of motion.     Cervical back: Normal range of motion and neck supple.     Comments: Right foot with clear infection - photo below.  Skin:    General: Skin is warm and dry.  Neurological:     General: No focal deficit present.     Mental Status: He is alert and oriented to person, place, and time.       ED Results / Procedures / Treatments   Labs (all labs ordered are listed, but only abnormal results are displayed) Labs Reviewed  COMPREHENSIVE METABOLIC PANEL - Abnormal; Notable for the following components:      Result Value   Sodium 132 (*)    Glucose, Bld 259 (*)    BUN 30 (*)     Creatinine, Ser 1.68 (*)    Total Protein 8.3 (*)    Albumin 2.9 (*)    GFR, Estimated 47 (*)    All other components within normal limits  CBC WITH DIFFERENTIAL/PLATELET - Abnormal; Notable for the following components:   WBC 12.1 (*)    RBC 4.12 (*)    Neutro Abs 8.6 (*)    Monocytes Absolute 1.1 (*)    Abs Immature Granulocytes 0.16 (*)    All other components within normal limits  CULTURE, BLOOD (ROUTINE X 2)  CULTURE, BLOOD (ROUTINE X 2)  RESP PANEL BY RT-PCR (FLU A&B, COVID) ARPGX2  LACTIC ACID, PLASMA  LACTIC ACID, PLASMA    EKG None  Radiology No results found.  Procedures Procedures  Medications Ordered in ED Medications  piperacillin-tazobactam (ZOSYN) IVPB 3.375 g (has no administration in time range)    ED Course  I have reviewed the triage vital signs and the nursing notes.  Pertinent labs & imaging results that were available during my care of the patient were reviewed by me and considered in my medical decision making (see chart for details).    MDM Rules/Calculators/A&P                          MDM  MSE complete  BRADYN SOWARD was evaluated in Emergency Department on 04/14/2021 for the symptoms described in the history of present illness. He was evaluated in the context of the global COVID-19 pandemic, which necessitated consideration that the patient might be at risk for infection with the SARS-CoV-2 virus that causes COVID-19. Institutional protocols and algorithms that pertain to the evaluation of patients at risk for COVID-19 are in a state of rapid change based on information released by regulatory bodies including the CDC and federal and state organizations. These policies and algorithms were followed during the patient's care in the ED.  Patient with right foot infection.  Patient will require IV antibiotics and admission for same.  Patient will likely need surgical debridement.  Patient understands and agrees with plan for  admission.  Patient's case discussed with Dr. Roosevelt Locks of the hospitalist service.  Final Clinical Impression(s) / ED Diagnoses Final diagnoses:  Right foot infection    Rx / DC Orders ED Discharge Orders    None       Valarie Merino, MD 04/14/21 [phone]

## 2021-04-14 NOTE — ED Notes (Signed)
Unsuccessful attempt to call report  rn needs a 10 minute call back

## 2021-04-14 NOTE — H&P (Signed)
History and Physical    Jesse [name] GYI:[id number] DOB: [birth year] DOA: 04/14/2021  PCP: Pcp, No (Confirm with patient/family/NH records and if not entered, this has to be entered at [facility] point of entry) Patient coming from: Home  I have personally briefly reviewed patient's old medical records in [place]  Chief Complaint: Right foot infection  HPI: Jesse [name] is a 60 y.o. 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  Patient has a great foot infection since March of this year, was treated with p.o. antibiotics, but failed last month.  Patient came to the ED 6 days ago, when it was found that the patient had acute worsening of the dorsal right foot infection with underneath abscess and medical suspicions for osteomyelitis.  X-ray showed gas in the soft tissue as well.  Patient however decided to sign AMA on same day.  Patient went home, continued to have purulent drainage from the right bottom side of the foot with foul-smelling.  Patient has been trying to use a needle to self drain the right foot infection every day, and he described the hospital as thick yellowish and smelly.  He has been having chills over the last 3 days but no fever.  ED Course: Worsening of the right foot swelling and thick smelly pus coming out of the bottom of the foot.  Afebrile, blood pressure on the lower side.  Worsening of kidney function on 1.6.  Patient received vancomycin and Zosyn in the ED.  MRI pending.  Right foot x-ray did not demonstrate gas in the soft tissue.  Review of Systems: As per HPI otherwise 14 point review of systems negative.    History reviewed. No pertinent past medical history.  History reviewed. No pertinent surgical history.   reports that he has never smoked. He has never used smokeless tobacco. He reports current alcohol  use of about 2.0 standard drinks of alcohol per week. He reports that he does not use drugs.  No Known Allergies  No family history on file.    Prior to Admission medications   Medication Sig Start Date End Date Taking? Authorizing Provider  atorvastatin (LIPITOR) 40 MG tablet Take 1 tablet (40 mg total) by mouth daily. 02/10/21   Simmons-Robinson, Makiera, MD  atorvastatin (LIPITOR) 40 MG tablet TAKE 1 TABLET (40 MG TOTAL) BY MOUTH DAILY. 02/09/21 02/09/22  Simmons-Robinson, Riki Sheer, MD  blood glucose meter kit and supplies Dispense based on patient and insurance preference. Use up to four times daily as directed. (FOR ICD-10 E10.9, E11.9). 02/09/21   Simmons-Robinson, Riki Sheer, MD  Blood Glucose Monitoring Suppl (TRUE METRIX METER) w/Device KIT USE UP TO FOUR TIMES DAILY AS DIRECTED. (FOR ICD-10 E10.9, E11.9). 02/09/21 02/09/22  Simmons-Robinson, Riki Sheer, MD  doxycycline (VIBRA-TABS) 100 MG tablet TAKE 1 TABLET (100 MG TOTAL) BY MOUTH DAILY FOR 3 DAYS. 02/09/21 02/09/22  Simmons-Robinson, Makiera, MD  glucose blood test strip USE UP TO FOUR TIMES DAILY AS DIRECTED 02/09/21 02/09/22  Simmons-Robinson, Riki Sheer, MD  insulin glargine (LANTUS) 100 UNIT/ML Solostar Pen Inject 10 Units into the skin daily. 02/09/21   Simmons-Robinson, Makiera, MD  insulin glargine (LANTUS) 100 UNIT/ML Solostar Pen INJECT 10 UNITS INTO THE SKIN DAILY. 02/09/21 02/09/22  Simmons-Robinson, Riki Sheer, MD  Insulin Pen Needle 31G X 8 MM MISC USE AS DIRECTED 02/09/21 02/09/22  Simmons-Robinson, Riki Sheer, MD  losartan (COZAAR) 25 MG tablet Take 1  tablet (25 mg total) by mouth daily. 02/09/21 04/10/21  Simmons-Robinson, Makiera, MD  losartan (COZAAR) 25 MG tablet TAKE 1 TABLET (25 MG TOTAL) BY MOUTH DAILY. 02/09/21 02/09/22  Simmons-Robinson, Riki Sheer, MD  TRUEplus Lancets 28G MISC USE UP TO FOUR TIMES DAILY AS DIRECTED 02/09/21 02/09/22  Simmons-Robinson, Riki Sheer, MD  glipiZIDE (GLUCOTROL) 5 MG tablet Take 1 tablet (5 mg total) by mouth daily before breakfast. Patient  not taking: Reported on 01/14/2016 01/31/14 01/14/16  Delfina Redwood, MD    Physical Exam: Vitals:   04/14/21 1216  BP: 102/71  Pulse: 91  Resp: 17  Temp: 98.1 F (36.7 C)  TempSrc: Oral  SpO2: 98%    Constitutional: NAD, calm, comfortable Vitals:   04/14/21 1216  BP: 102/71  Pulse: 91  Resp: 17  Temp: 98.1 F (36.7 C)  TempSrc: Oral  SpO2: 98%   Eyes: PERRL, lids and conjunctivae normal ENMT: Mucous membranes are dry. Posterior pharynx clear of any exudate or lesions.Normal dentition.  Neck: normal, supple, no masses, no thyromegaly Respiratory: clear to auscultation bilaterally, no wheezing, no crackles. Normal respiratory effort. No accessory muscle use.  Cardiovascular: Regular rate and rhythm, no murmurs / rubs / gallops. No extremity edema. 2+ pedal pulses. No carotid bruits.  Abdomen: no tenderness, no masses palpated. No hepatosplenomegaly. Bowel sounds positive.  Musculoskeletal: no clubbing / cyanosis. No joint deformity upper and lower extremities. Good ROM, no contractures. Normal muscle tone.  Skin: Right plantar side of the foot large ulcer with purulent discharge and crater like appearance with surrounding swelling Neurologic: CN 2-12 grossly intact. Sensation intact, DTR normal. Strength 5/5 in all 4.  Psychiatric: Normal judgment and insight. Alert and oriented x 3. Normal mood.        Labs on Admission: I have personally reviewed following labs and imaging studies  CBC: Recent Labs  Lab 04/08/21 1949 04/14/21 1306  WBC 25.8* 12.1*  NEUTROABS 21.1* 8.6*  HGB 12.4* 13.0  HCT 36.8* 39.3  MCV 94.1 95.4  PLT 331 786   Basic Metabolic Panel: Recent Labs  Lab 04/08/21 1949 04/14/21 1306  NA 131* 132*  K 4.3 5.0  CL 99 100  CO2 21* 22  GLUCOSE 186* 259*  BUN 31* 30*  CREATININE 1.31* 1.68*  CALCIUM 9.4 9.5   GFR: Estimated Creatinine Clearance: 59.9 mL/min (A) (by C-G formula based on SCr of 1.68 mg/dL (H)). Liver Function  Tests: Recent Labs  Lab 04/08/21 1949 04/14/21 1306  AST 22 21  ALT 33 31  ALKPHOS 149* 108  BILITOT 0.3 0.6  PROT 8.3* 8.3*  ALBUMIN 3.0* 2.9*   No results for input(s): LIPASE, AMYLASE in the last 168 hours. No results for input(s): AMMONIA in the last 168 hours. Coagulation Profile: No results for input(s): INR, PROTIME in the last 168 hours. Cardiac Enzymes: No results for input(s): CKTOTAL, CKMB, CKMBINDEX, TROPONINI in the last 168 hours. BNP (last 3 results) No results for input(s): PROBNP in the last 8760 hours. HbA1C: No results for input(s): HGBA1C in the last 72 hours. CBG: Recent Labs  Lab 04/08/21 2221  GLUCAP 212*   Lipid Profile: No results for input(s): CHOL, HDL, LDLCALC, TRIG, CHOLHDL, LDLDIRECT in the last 72 hours. Thyroid Function Tests: No results for input(s): TSH, T4TOTAL, FREET4, T3FREE, THYROIDAB in the last 72 hours. Anemia Panel: No results for input(s): VITAMINB12, FOLATE, FERRITIN, TIBC, IRON, RETICCTPCT in the last 72 hours. Urine analysis:    Component Value Date/Time   COLORURINE YELLOW 02/07/2021 1521  APPEARANCEUR CLEAR 02/07/2021 1521   LABSPEC 1.014 02/07/2021 1521   PHURINE 5.0 02/07/2021 1521   GLUCOSEU 150 (A) 02/07/2021 1521   HGBUR SMALL (A) 02/07/2021 1521   BILIRUBINUR NEGATIVE 02/07/2021 [place] 02/07/2021 1521   PROTEINUR NEGATIVE 02/07/2021 1521   NITRITE NEGATIVE 02/07/2021 [place] 02/07/2021 1521    Radiological Exams on Admission: No results found.  EKG: Independently reviewed. Sinus, no acute ST-T changes.  Assessment/Plan Active Problems:   * No active hospital problems. *  (please populate well all problems here in Problem List. (For example, if patient is on BP meds at home and you resume or decide to hold them, it is a problem that needs to be her. Same for CAD, COPD, HLD and so on)  Infected right foot diabetic ulcer, and gas gangrene -Suspect osteomyelitis  underlying -MRI pending -Discussed with on-call podiatrist Dr. March Rummage, will see the patient.  May need foot amputation. -Continue vancomycin and Zosyn.  AKI -Appear to be hypovolemic -We will give 1 dose of IV bolus with 1000 mL -Normal saline at 125 for 1 day  IDDM with hyperglycemia -Resume Lantus 10 units at bedtime -Add sliding scale.  HTN  -Now has borderline hypotension -Hold BP meds for now  HLD -Continue Statin  Hx of alcohol abuse -Denied having used alcohol recently -CIWA protocol with as needed benzos.  DVT prophylaxis: Lovenox  code Status: Full Code Family Communication: Sister not available Disposition Plan: Tele admit, expect more than 2 midnight hospital stay, expect surgery intervention Consults called: Podiatrist Dr. March Rummage Admission status: Tele admit   Lequita Halt MD [facility] Pager [phone]  04/14/2021, 3:26 PM

## 2021-04-14 NOTE — Progress Notes (Signed)
Pharmacy Antibiotic Note  Jesse [name] is a 60 y.o. male admitted on 04/14/2021 presenting with R-foot drainage.  Pharmacy has been consulted for vancomycin and zosyn dosing.  Plan: Vancomycin 1500 mg IV x 1, then 1250 mg IV q 24h (eAUC 498, Goal AUC 400-550, SCr 1.68) Zosyn 3.375g IV every 8 hours (extended infusion) Monitor renal function, Cx and clinical progression to narrow Vancomycin levels as needed     Temp (24hrs), Avg:98.1 F (36.7 C), Min:98.1 F (36.7 C), Max:98.1 F (36.7 C)  Recent Labs  Lab 04/08/21 1949 04/08/21 2234 04/14/21 1306  WBC 25.8*  --  12.1*  CREATININE 1.31*  --  1.68*  LATICACIDVEN 1.1 1.0 1.3    Estimated Creatinine Clearance: 59.9 mL/min (A) (by C-G formula based on SCr of 1.68 mg/dL (H)).    No Known Allergies  Daylene Posey, PharmD Clinical Pharmacist ED Pharmacist Phone # [phone] 04/14/2021 4:01 PM

## 2021-04-14 NOTE — ED Notes (Signed)
Report called to rn on 5n 

## 2021-04-14 NOTE — ED Notes (Signed)
Cbc not collected  First drawn at 1330

## 2021-04-14 NOTE — ED Triage Notes (Signed)
Patient complains of ongoing pain and swelling to right foot after he stepped on nail and tried to drain the pus from same. Odor with pain and swelling noted. Denies fever

## 2021-04-15 NOTE — Plan of Care (Signed)

## 2021-04-15 NOTE — Progress Notes (Signed)
HOSPITAL MEDICINE OVERNIGHT EVENT NOTE    Notified by nursing of patient's desire to leave AMA.  I went to speak to the patient at the bedside and explained to him the severity of his soft tissue infection and how delayed aggressive care could very likely result in rapid progression of his disease and even death.  Patient voiced his understanding and explained that his roommate are "stealing" his "stuff" and he had to leave regardless.    Patient has signed all appropriate paperwork and plans to leave AMA.   Marinda Elk  MD [facility]

## 2021-04-15 NOTE — Progress Notes (Signed)
Patient insisted on leaving. Patient encouraged and educated on  why it is vital for him to stay for medical treatment. MD-on call notified. Patient signed the AMA and left at this time.

## 2021-04-16 LAB — HEMOGLOBIN A1C
Hgb A1c MFr Bld: 10.9 % — ABNORMAL HIGH (ref 4.8–5.6)
Mean Plasma Glucose: 266 mg/dL

## 2021-04-17 ENCOUNTER — Encounter (HOSPITAL_COMMUNITY): Payer: Self-pay | Admitting: Internal Medicine

## 2021-04-17 ENCOUNTER — Inpatient Hospital Stay (HOSPITAL_COMMUNITY)
Admission: EM | Admit: 2021-04-17 | Discharge: 2021-04-23 | DRG: 264 | Disposition: A | Discharge status: Home / Self Care | Payer: Self-pay | Attending: Family Medicine | Admitting: Family Medicine

## 2021-04-17 ENCOUNTER — Other Ambulatory Visit: Payer: Self-pay

## 2021-04-17 DIAGNOSIS — E669 Obesity, unspecified: Secondary | ICD-10-CM | POA: Diagnosis present

## 2021-04-17 DIAGNOSIS — L089 Local infection of the skin and subcutaneous tissue, unspecified: Secondary | ICD-10-CM | POA: Diagnosis present

## 2021-04-17 DIAGNOSIS — E785 Hyperlipidemia, unspecified: Secondary | ICD-10-CM | POA: Diagnosis present

## 2021-04-17 DIAGNOSIS — L03115 Cellulitis of right lower limb: Secondary | ICD-10-CM

## 2021-04-17 DIAGNOSIS — E11621 Type 2 diabetes mellitus with foot ulcer: Secondary | ICD-10-CM | POA: Diagnosis present

## 2021-04-17 DIAGNOSIS — M79644 Pain in right finger(s): Secondary | ICD-10-CM

## 2021-04-17 DIAGNOSIS — Z79899 Other long term (current) drug therapy: Secondary | ICD-10-CM

## 2021-04-17 DIAGNOSIS — L02611 Cutaneous abscess of right foot: Secondary | ICD-10-CM | POA: Diagnosis present

## 2021-04-17 DIAGNOSIS — IMO0002 Reserved for concepts with insufficient information to code with codable children: Secondary | ICD-10-CM | POA: Diagnosis present

## 2021-04-17 DIAGNOSIS — S91339A Puncture wound without foreign body, unspecified foot, initial encounter: Secondary | ICD-10-CM | POA: Diagnosis present

## 2021-04-17 DIAGNOSIS — F1010 Alcohol abuse, uncomplicated: Secondary | ICD-10-CM | POA: Diagnosis present

## 2021-04-17 DIAGNOSIS — I10 Essential (primary) hypertension: Secondary | ICD-10-CM | POA: Diagnosis present

## 2021-04-17 DIAGNOSIS — M2540 Effusion, unspecified joint: Secondary | ICD-10-CM

## 2021-04-17 DIAGNOSIS — E1152 Type 2 diabetes mellitus with diabetic peripheral angiopathy with gangrene: Principal | ICD-10-CM | POA: Diagnosis present

## 2021-04-17 DIAGNOSIS — Z794 Long term (current) use of insulin: Secondary | ICD-10-CM

## 2021-04-17 DIAGNOSIS — E1142 Type 2 diabetes mellitus with diabetic polyneuropathy: Secondary | ICD-10-CM | POA: Diagnosis present

## 2021-04-17 DIAGNOSIS — E1165 Type 2 diabetes mellitus with hyperglycemia: Secondary | ICD-10-CM | POA: Diagnosis present

## 2021-04-17 DIAGNOSIS — E11628 Type 2 diabetes mellitus with other skin complications: Secondary | ICD-10-CM | POA: Diagnosis present

## 2021-04-17 DIAGNOSIS — Z20822 Contact with and (suspected) exposure to covid-19: Secondary | ICD-10-CM | POA: Diagnosis present

## 2021-04-17 DIAGNOSIS — E119 Type 2 diabetes mellitus without complications: Secondary | ICD-10-CM | POA: Diagnosis present

## 2021-04-17 DIAGNOSIS — B964 Proteus (mirabilis) (morganii) as the cause of diseases classified elsewhere: Secondary | ICD-10-CM | POA: Diagnosis present

## 2021-04-17 DIAGNOSIS — Z7984 Long term (current) use of oral hypoglycemic drugs: Secondary | ICD-10-CM

## 2021-04-17 DIAGNOSIS — Z6831 Body mass index (BMI) 31.0-31.9, adult: Secondary | ICD-10-CM

## 2021-04-17 DIAGNOSIS — L97519 Non-pressure chronic ulcer of other part of right foot with unspecified severity: Secondary | ICD-10-CM | POA: Diagnosis present

## 2021-04-17 DIAGNOSIS — D72829 Elevated white blood cell count, unspecified: Secondary | ICD-10-CM | POA: Diagnosis present

## 2021-04-17 DIAGNOSIS — F79 Unspecified intellectual disabilities: Secondary | ICD-10-CM

## 2021-04-17 DIAGNOSIS — D62 Acute posthemorrhagic anemia: Secondary | ICD-10-CM | POA: Diagnosis present

## 2021-04-17 DIAGNOSIS — N179 Acute kidney failure, unspecified: Secondary | ICD-10-CM | POA: Diagnosis present

## 2021-04-17 DIAGNOSIS — W450XXA Nail entering through skin, initial encounter: Secondary | ICD-10-CM

## 2021-04-17 DIAGNOSIS — M7989 Other specified soft tissue disorders: Secondary | ICD-10-CM | POA: Diagnosis present

## 2021-04-17 DIAGNOSIS — E875 Hyperkalemia: Secondary | ICD-10-CM | POA: Diagnosis present

## 2021-04-17 HISTORY — DX: Type 2 diabetes mellitus with other specified complication: E11.69

## 2021-04-17 HISTORY — DX: Essential (primary) hypertension: I10

## 2021-04-17 HISTORY — DX: Alcohol abuse, uncomplicated: F10.10

## 2021-04-17 HISTORY — DX: Cellulitis of right lower limb: L03.115

## 2021-04-17 HISTORY — DX: Type 2 diabetes mellitus with other specified complication: E66.9

## 2021-04-17 LAB — BASIC METABOLIC PANEL
Anion gap: 8 (ref 5–15)
BUN: 30 mg/dL — ABNORMAL HIGH (ref 6–20)
CO2: 22 mmol/L (ref 22–32)
Calcium: 9.1 mg/dL (ref 8.9–10.3)
Chloride: 105 mmol/L (ref 98–111)
Creatinine, Ser: 1.54 mg/dL — ABNORMAL HIGH (ref 0.61–1.24)
GFR, Estimated: 52 mL/min — ABNORMAL LOW (ref >60)
Glucose, Bld: 230 mg/dL — ABNORMAL HIGH (ref 70–99)
Potassium: 4.6 mmol/L (ref 3.5–5.1)
Sodium: 135 mmol/L (ref 135–145)

## 2021-04-17 LAB — CBC WITH DIFFERENTIAL/PLATELET
Abs Immature Granulocytes: 0.07 10*3/uL (ref 0.00–0.07)
Basophils Absolute: 0.1 10*3/uL (ref 0.0–0.1)
Basophils Relative: 1 %
Eosinophils Absolute: 0.1 10*3/uL (ref 0.0–0.5)
Eosinophils Relative: 1 %
HCT: 37.4 % — ABNORMAL LOW (ref 39.0–52.0)
Hemoglobin: 12.1 g/dL — ABNORMAL LOW (ref 13.0–17.0)
Immature Granulocytes: 1 %
Lymphocytes Relative: 23 %
Lymphs Abs: 2.5 10*3/uL (ref 0.7–4.0)
MCH: 31.6 pg (ref 26.0–34.0)
MCHC: 32.4 g/dL (ref 30.0–36.0)
MCV: 97.7 fL (ref 80.0–100.0)
Monocytes Absolute: 1.2 10*3/uL — ABNORMAL HIGH (ref 0.1–1.0)
Monocytes Relative: 11 %
Neutro Abs: 6.8 10*3/uL (ref 1.7–7.7)
Neutrophils Relative %: 63 %
Platelets: 365 10*3/uL (ref 150–400)
RBC: 3.83 MIL/uL — ABNORMAL LOW (ref 4.22–5.81)
RDW: 12.6 % (ref 11.5–15.5)
WBC: 10.7 10*3/uL — ABNORMAL HIGH (ref 4.0–10.5)
nRBC: 0 % (ref 0.0–0.2)

## 2021-04-17 LAB — RESP PANEL BY RT-PCR (FLU A&B, COVID) ARPGX2
Influenza A by PCR: NEGATIVE
Influenza B by PCR: NEGATIVE
SARS Coronavirus 2 by RT PCR: NEGATIVE

## 2021-04-17 LAB — CBG MONITORING, ED: Glucose-Capillary: 191 mg/dL — ABNORMAL HIGH (ref 70–99)

## 2021-04-17 MED ORDER — THIAMINE HCL 100 MG/ML IJ SOLN: 100.0000 mg | Freq: Every day | INTRAMUSCULAR | Status: DC

## 2021-04-17 MED ORDER — ONDANSETRON HCL 4 MG PO TABS: 4.0000 mg | ORAL_TABLET | Freq: Four times a day (QID) | ORAL | Status: DC | PRN

## 2021-04-17 MED ORDER — SODIUM CHLORIDE 0.9 % IV SOLN
2.0000 g | Freq: Once | INTRAVENOUS | Status: AC
  Administered 2021-04-17: 2 g via INTRAVENOUS
  Filled 2021-04-17: qty 2

## 2021-04-17 MED ORDER — ATORVASTATIN CALCIUM 40 MG PO TABS
40.0000 mg | ORAL_TABLET | Freq: Every day | ORAL | Status: DC
  Administered 2021-04-17 – 2021-04-23 (×7): 40 mg via ORAL
  Filled 2021-04-17 (×7): qty 1

## 2021-04-17 MED ORDER — SODIUM CHLORIDE 0.9 % IV SOLN: INTRAVENOUS | Status: DC

## 2021-04-17 MED ORDER — SODIUM CHLORIDE 0.9 % IV SOLN
2.0000 g | Freq: Three times a day (TID) | INTRAVENOUS | Status: DC
  Administered 2021-04-18 – 2021-04-23 (×15): 2 g via INTRAVENOUS
  Filled 2021-04-17 (×15): qty 2

## 2021-04-17 MED ORDER — ENOXAPARIN SODIUM 40 MG/0.4ML IJ SOSY
40.0000 mg | PREFILLED_SYRINGE | INTRAMUSCULAR | Status: DC
  Administered 2021-04-17 – 2021-04-18 (×2): 40 mg via SUBCUTANEOUS
  Filled 2021-04-17 (×2): qty 0.4

## 2021-04-17 MED ORDER — VANCOMYCIN HCL 1500 MG/300ML IV SOLN
1500.0000 mg | INTRAVENOUS | Status: DC
  Administered 2021-04-18 – 2021-04-22 (×4): 1500 mg via INTRAVENOUS
  Filled 2021-04-17 (×5): qty 300

## 2021-04-17 MED ORDER — ACETAMINOPHEN 325 MG PO TABS: 650.0000 mg | ORAL_TABLET | Freq: Four times a day (QID) | ORAL | Status: DC | PRN

## 2021-04-17 MED ORDER — FOLIC ACID 1 MG PO TABS
1.0000 mg | ORAL_TABLET | Freq: Every day | ORAL | Status: DC
  Administered 2021-04-17 – 2021-04-23 (×7): 1 mg via ORAL
  Filled 2021-04-17 (×7): qty 1

## 2021-04-17 MED ORDER — ALBUTEROL SULFATE (2.5 MG/3ML) 0.083% IN NEBU: 2.5000 mg | INHALATION_SOLUTION | Freq: Four times a day (QID) | RESPIRATORY_TRACT | Status: DC | PRN

## 2021-04-17 MED ORDER — ACETAMINOPHEN 650 MG RE SUPP: 650.0000 mg | Freq: Four times a day (QID) | RECTAL | Status: DC | PRN

## 2021-04-17 MED ORDER — ADULT MULTIVITAMIN W/MINERALS CH
1.0000 | ORAL_TABLET | Freq: Every day | ORAL | Status: DC
  Administered 2021-04-17 – 2021-04-23 (×7): 1 via ORAL
  Filled 2021-04-17 (×7): qty 1

## 2021-04-17 MED ORDER — SODIUM CHLORIDE 0.9% FLUSH
3.0000 mL | Freq: Two times a day (BID) | INTRAVENOUS | Status: DC
  Administered 2021-04-18 – 2021-04-21 (×6): 3 mL via INTRAVENOUS

## 2021-04-17 MED ORDER — LORAZEPAM 1 MG PO TABS: 1.0000 mg | ORAL_TABLET | ORAL | Status: DC | PRN

## 2021-04-17 MED ORDER — INSULIN ASPART 100 UNIT/ML IJ SOLN
0.0000 [IU] | Freq: Three times a day (TID) | INTRAMUSCULAR | Status: DC
  Administered 2021-04-18 (×2): 2 [IU] via SUBCUTANEOUS
  Administered 2021-04-19: 3 [IU] via SUBCUTANEOUS
  Administered 2021-04-19 – 2021-04-20 (×3): 2 [IU] via SUBCUTANEOUS
  Administered 2021-04-20: 1 [IU] via SUBCUTANEOUS
  Administered 2021-04-21: 3 [IU] via SUBCUTANEOUS
  Administered 2021-04-21: 1 [IU] via SUBCUTANEOUS
  Administered 2021-04-21: 5 [IU] via SUBCUTANEOUS
  Administered 2021-04-22 (×2): 2 [IU] via SUBCUTANEOUS
  Administered 2021-04-22: 1 [IU] via SUBCUTANEOUS
  Administered 2021-04-23: 3 [IU] via SUBCUTANEOUS
  Administered 2021-04-23: 2 [IU] via SUBCUTANEOUS

## 2021-04-17 MED ORDER — ONDANSETRON HCL 4 MG/2ML IJ SOLN: 4.0000 mg | Freq: Four times a day (QID) | INTRAMUSCULAR | Status: DC | PRN

## 2021-04-17 MED ORDER — VANCOMYCIN HCL 2000 MG/400ML IV SOLN
2000.0000 mg | Freq: Once | INTRAVENOUS | Status: AC
  Administered 2021-04-17: 2000 mg via INTRAVENOUS
  Filled 2021-04-17 (×2): qty 400

## 2021-04-17 MED ORDER — INSULIN GLARGINE 100 UNIT/ML SOLOSTAR PEN: 10.0000 [IU] | PEN_INJECTOR | Freq: Every day | SUBCUTANEOUS | Status: DC

## 2021-04-17 MED ORDER — THIAMINE HCL 100 MG PO TABS
100.0000 mg | ORAL_TABLET | Freq: Every day | ORAL | Status: DC
  Administered 2021-04-17 – 2021-04-23 (×7): 100 mg via ORAL
  Filled 2021-04-17 (×6): qty 1

## 2021-04-17 MED ORDER — INSULIN GLARGINE 100 UNIT/ML ~~LOC~~ SOLN
10.0000 [IU] | Freq: Every day | SUBCUTANEOUS | Status: DC
  Administered 2021-04-17 – 2021-04-23 (×7): 10 [IU] via SUBCUTANEOUS
  Filled 2021-04-17 (×7): qty 0.1

## 2021-04-17 MED ORDER — LOSARTAN POTASSIUM 25 MG PO TABS
25.0000 mg | ORAL_TABLET | Freq: Every day | ORAL | Status: DC
  Administered 2021-04-17 – 2021-04-23 (×7): 25 mg via ORAL
  Filled 2021-04-17 (×7): qty 1

## 2021-04-17 MED ORDER — LORAZEPAM 2 MG/ML IJ SOLN: 1.0000 mg | INTRAMUSCULAR | Status: DC | PRN

## 2021-04-17 NOTE — ED Provider Notes (Signed)
[facility] EMERGENCY DEPARTMENT Provider Note   CSN: [id number] Arrival date & time: 04/17/21  2683     History Chief Complaint  Patient presents with  . Foot Injury    Jesse [name] is a 60 y.o. male presenting for evaluation of foot infection.  Patient states he stepped on a nail few weeks ago, and since then has had problems with his foot.  He was in the ER on 5/29, left AMA.  He was admitted on 6/4 and left AMA on 6-5.  He states he is leaving due to social concerns.  However he is now willing to stay in the hospital.  He reports worsening drainage, smell, and pain of his foot.  He states his blood sugars have been well controlled.  He denies fevers, chills, nausea, vomiting.  Additional history obtained from chart review.  History of diabetes, alcohol abuse, hypertension, foot infection.  I reviewed recent admission documentation and recent imaging including x-rays and MRI.  MRI showed gas, but no osteomyelitis "1. No findings of osteomyelitis or drainable abscess. 2. Plantar ulceration below the midfoot and tracking around the lateral portion of the midfoot in the subcutaneous tissues, with some scattered gas in the subcutaneous tissues. 3. Low-level edema tracking in the musculature of the foot, quite likely neurogenic. 4. Subcutaneous edema in the foot, especially dorsally, potentially Cellulitis."    HPI     No past medical history on file.  Patient Active Problem List   Diagnosis Date Noted  . Osteomyelitis ([place]) 04/14/2021  . Right foot infection   . Diabetic foot infection ([place]) 04/08/2021  . HTN (hypertension) 04/08/2021  . HLD (hyperlipidemia) 04/08/2021  . Alcohol abuse   . Cellulitis   . Bilateral leg edema 02/07/2021  . Anasarca   . 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 2 diabetes mellitus, limited to breakdown of skin ([place])   . Hypokalemia 01/28/2014  . DM (diabetes mellitus), type 2, uncontrolled ([place]) 01/27/2014   . AKI (acute kidney injury) ([place]) 01/27/2014  . Leukocytosis, unspecified 01/27/2014  . Facial cellulitis 01/26/2014  . Dental abscess 01/26/2014  . Infected dental carries 01/26/2014  . Cellulitis and abscess 01/26/2014    No past surgical history on file.     No family history on file.  Social History   Tobacco Use  . Smoking status: Never Smoker  . Smokeless tobacco: Never Used  Substance Use Topics  . Alcohol use: Yes    Alcohol/week: 2.0 standard drinks    Types: 2 Cans of beer per week    Comment: Occasional  . Drug use: No    Home Medications Prior to Admission medications   Medication Sig Start Date End Date Taking? Authorizing Provider  atorvastatin (LIPITOR) 40 MG tablet Take 1 tablet (40 mg total) by mouth daily. Patient not taking: Reported on 04/14/2021 02/10/21   Eulis Foster, MD  blood glucose meter kit and supplies Dispense based on patient and insurance preference. Use up to four times daily as directed. (FOR ICD-10 E10.9, E11.9). 02/09/21   Simmons-Robinson, Riki Sheer, MD  Blood Glucose Monitoring Suppl (TRUE METRIX METER) w/Device KIT USE UP TO FOUR TIMES DAILY AS DIRECTED. (FOR ICD-10 E10.9, E11.9). 02/09/21 02/09/22  Simmons-Robinson, Riki Sheer, MD  doxycycline (VIBRA-TABS) 100 MG tablet TAKE 1 TABLET (100 MG TOTAL) BY MOUTH DAILY FOR 3 DAYS. Patient not taking: Reported on 04/14/2021 02/09/21 02/09/22  Simmons-Robinson, Riki Sheer, MD  glucose blood test strip USE UP TO FOUR TIMES DAILY AS  DIRECTED Patient taking differently: USE UP TO FOUR TIMES DAILY AS DIRECTED 02/09/21 02/09/22  Simmons-Robinson, Riki Sheer, MD  insulin glargine (LANTUS) 100 UNIT/ML Solostar Pen INJECT 10 UNITS INTO THE SKIN DAILY. Patient not taking: No sig reported 02/09/21 02/09/22  Simmons-Robinson, Riki Sheer, MD  Insulin Pen Needle 31G X 8 MM MISC USE AS DIRECTED 02/09/21 02/09/22  Simmons-Robinson, Riki Sheer, MD  losartan (COZAAR) 25 MG tablet TAKE 1 TABLET (25 MG TOTAL) BY MOUTH DAILY. Patient not  taking: Reported on 04/14/2021 02/09/21 02/09/22  Simmons-Robinson, Riki Sheer, MD  TRUEplus Lancets 28G MISC USE UP TO FOUR TIMES DAILY AS DIRECTED 02/09/21 02/09/22  Simmons-Robinson, Riki Sheer, MD  glipiZIDE (GLUCOTROL) 5 MG tablet Take 1 tablet (5 mg total) by mouth daily before breakfast. Patient not taking: Reported on 01/14/2016 01/31/14 01/14/16  Delfina Redwood, MD    Allergies    Patient has no known allergies.  Review of Systems   Review of Systems  Musculoskeletal: Positive for arthralgias and joint swelling.  Skin: Positive for wound.  All other systems reviewed and are negative.   Physical Exam Updated Vital Signs BP (!) 136/108 (BP Location: Right Arm)   Pulse 85   Temp 97.9 F (36.6 C) (Oral)   Resp 17   Ht 6' (1.829 m)   Wt 106.6 kg   SpO2 100%   BMI 31.87 kg/m   Physical Exam Vitals and nursing note reviewed.  Constitutional:      General: He is not in acute distress.    Appearance: He is well-developed.     Comments: Nontoxic  HENT:     Head: Normocephalic and atraumatic.  Eyes:     Conjunctiva/sclera: Conjunctivae normal.     Pupils: Pupils are equal, round, and reactive to light.  Cardiovascular:     Rate and Rhythm: Normal rate and regular rhythm.     Pulses: Normal pulses.  Pulmonary:     Effort: Pulmonary effort is normal. No respiratory distress.     Breath sounds: Normal breath sounds. No wheezing.  Abdominal:     General: There is no distension.     Palpations: Abdomen is soft. There is no mass.     Tenderness: There is no abdominal tenderness. There is no guarding or rebound.  Musculoskeletal:        General: Normal range of motion.     Cervical back: Normal range of motion and neck supple.     Comments: See picture below.  Foul smelling large wound of the plantar right foot with significant associated soft tissue swelling.  Additional smaller ulceration of the right great toe.  Skin:    General: Skin is warm and dry.     Capillary Refill: Capillary  refill takes less than 2 seconds.  Neurological:     Mental Status: He is alert and oriented to person, place, and time.            ED Results / Procedures / Treatments   Labs (all labs ordered are listed, but only abnormal results are displayed) Labs Reviewed  CBC WITH DIFFERENTIAL/PLATELET - Abnormal; Notable for the following components:      Result Value   WBC 10.7 (*)    RBC 3.83 (*)    Hemoglobin 12.1 (*)    HCT 37.4 (*)    Monocytes Absolute 1.2 (*)    All other components within normal limits  BASIC METABOLIC PANEL - Abnormal; Notable for the following components:   Glucose, Bld 230 (*)    BUN 30 (*)  Creatinine, Ser 1.54 (*)    GFR, Estimated 52 (*)    All other components within normal limits  RESP PANEL BY RT-PCR (FLU A&B, COVID) [id number]    EKG None  Radiology No results found.  Procedures Procedures   Medications Ordered in ED Medications - No data to display  ED Course  I have reviewed the triage vital signs and the nursing notes.  Pertinent labs & imaging results that were available during my care of the patient were reviewed by me and considered in my medical decision making (see chart for details).    MDM Rules/Calculators/A&P                          Patient presenting for evaluation of continued foot infection.  On exam, patient appears nontoxic.  Vital signs are stable, does not meet SIRS criteria.  Low suspicion for sepsis.  However exam of the foot is consistent with infection.  Per chart review, patient has had MRI that shows soft tissue gas but no osteomyelitis.  Labs show continued mild leukocytosis, otherwise reassuring.  We will plan for admission for IV antibiotics and consultation with Ortho.  Discussed with Jacqulynn Cadet, PA-C from orthopedics who evaluated the patient.  Will discuss with Dr. Sharol Given, with likely plan for debridement tomorrow.  Discussed with Dr. Tamala Julian from [facility] hospitalist service, patient to be admitted.  Final  Clinical Impression(s) / ED Diagnoses Final diagnoses:  Foot infection    Rx / DC Orders ED Discharge Orders    None       Franchot Heidelberg, PA-C 04/17/21 1618    Lucrezia Starch, MD 04/19/21 1758

## 2021-04-17 NOTE — ED Provider Notes (Signed)
Emergency Medicine Provider Triage Evaluation Note  Jesse [name] , a 60 y.o. male  was evaluated in triage.  Pt complains of right foot wound. Was here a few days ago and said he needed operation on it. Pain, numbness and tingling to foot. Diabetic patient. Left AMA on 5/29 and 6/4 for same complaint.   Review of Systems  Positive: Foot pain, numbness, tingling  Negative: Fevers, chill, nausea   Physical Exam  There were no vitals taken for this visit. Gen:   Awake, no distress   Resp:  Normal effort  MSK:   Moves extremities without difficulty  Other:  DP/PT palpable. Ulcers to sole and large toe.   Medical Decision Making  Medically screening exam initiated at 9:35 AM.  Appropriate orders placed.  Jesse [name] was informed that the remainder of the evaluation will be completed by another provider, this initial triage assessment does not replace that evaluation, and the importance of remaining in the ED until their evaluation is complete.  Labs ordered. Covid test ordered. Patient will need to be admitted for IV antibiotics and surgical debridement.    Theron Arista, PA-C 04/17/21 0539    Arby Barrette, MD 04/24/21 2125

## 2021-04-17 NOTE — Consult Note (Signed)
Reason for Consult:Right foot infection Referring Physician: Marianna Fuss Time called: 1558 Time at bedside: 1601   Jesse [name] is an 60 y.o. male.  HPI: Giordano stepped on a nail about 2 weeks ago (put probably longer given statements made on prior admission 5/29). Since then he's developed a wound on the sole of his foot that just keeps getting worse. It has drained pus several times since then. He's been able to express some pus out of it but has been unable to do that for the last few days. He denies fevers, chills, sweats, N/V, or prior e/o. He works as a Curator.  No past medical history on file.  No past surgical history on file.  No family history on file.  Social History:  reports that he has never smoked. He has never used smokeless tobacco. He reports current alcohol use of about 2.0 standard drinks of alcohol per week. He reports that he does not use drugs.  Allergies: No Known Allergies  Medications: I have reviewed the patient's current medications.  Results for orders placed or performed during the hospital encounter of 04/17/21 (from the past 48 hour(s))  Resp Panel by RT-PCR (Flu A&B, Covid) Nasopharyngeal Swab     Status: None   Collection Time: 04/17/21  9:40 AM   Specimen: Nasopharyngeal Swab; Nasopharyngeal(NP) swabs in vial transport medium  Result Value Ref Range   SARS Coronavirus 2 by RT PCR NEGATIVE NEGATIVE    Comment: (NOTE) SARS-CoV-2 target nucleic acids are NOT DETECTED.  The SARS-CoV-2 RNA is generally detectable in upper respiratory specimens during the acute phase of infection. The lowest concentration of SARS-CoV-2 viral copies this assay can detect is 138 copies/mL. A negative result does not preclude SARS-Cov-2 infection and should not be used as the sole basis for treatment or other patient management decisions. A negative result may occur with  improper specimen collection/handling, submission of specimen other than nasopharyngeal  swab, presence of viral mutation(s) within the areas targeted by this assay, and inadequate number of viral copies(<138 copies/mL). A negative result must be combined with clinical observations, patient history, and epidemiological information. The expected result is Negative.  Fact Sheet for Patients:  BloggerCourse.com  Fact Sheet for Healthcare Providers:  SeriousBroker.it  This test is no t yet approved or cleared by the [place] FDA and  has been authorized for detection and/or diagnosis of SARS-CoV-2 by FDA under an Emergency Use Authorization (EUA). This EUA will remain  in effect (meaning this test can be used) for the duration of the COVID-19 declaration under Section 564(b)(1) of the Act, 21 U.S.C.section 360bbb-3(b)(1), unless the authorization is terminated  or revoked sooner.       Influenza A by PCR NEGATIVE NEGATIVE   Influenza B by PCR NEGATIVE NEGATIVE    Comment: (NOTE) The Xpert Xpress SARS-CoV-2/FLU/RSV plus assay is intended as an aid in the diagnosis of influenza from Nasopharyngeal swab specimens and should not be used as a sole basis for treatment. Nasal washings and aspirates are unacceptable for Xpert Xpress SARS-CoV-2/FLU/RSV testing.  Fact Sheet for Patients: BloggerCourse.com  Fact Sheet for Healthcare Providers: SeriousBroker.it  This test is not yet approved or cleared by the [place] FDA and has been authorized for detection and/or diagnosis of SARS-CoV-2 by FDA under an Emergency Use Authorization (EUA). This EUA will remain in effect (meaning this test can be used) for the duration of the COVID-19 declaration under Section 564(b)(1) of the Act, 21 U.S.C. section 360bbb-3(b)(1), unless the  authorization is terminated or revoked.  Performed at [facility], [address] [place], [place] [address]   CBC with Differential      Status: Abnormal   Collection Time: 04/17/21  9:59 AM  Result Value Ref Range   WBC 10.7 (H) 4.0 - 10.5 K/uL   RBC 3.83 (L) 4.22 - 5.81 MIL/uL   Hemoglobin 12.1 (L) 13.0 - 17.0 g/dL   HCT 23.5 (L) 57.3 - 22.0 %   MCV 97.7 80.0 - 100.0 fL   MCH 31.6 26.0 - 34.0 pg   MCHC 32.4 30.0 - 36.0 g/dL   RDW 25.4 27.0 - 62.3 %   Platelets 365 150 - 400 K/uL   nRBC 0.0 0.0 - 0.2 %   Neutrophils Relative % 63 %   Neutro Abs 6.8 1.7 - 7.7 K/uL   Lymphocytes Relative 23 %   Lymphs Abs 2.5 0.7 - 4.0 K/uL   Monocytes Relative 11 %   Monocytes Absolute 1.2 (H) 0.1 - 1.0 K/uL   Eosinophils Relative 1 %   Eosinophils Absolute 0.1 0.0 - 0.5 K/uL   Basophils Relative 1 %   Basophils Absolute 0.1 0.0 - 0.1 K/uL   Immature Granulocytes 1 %   Abs Immature Granulocytes 0.07 0.00 - 0.07 K/uL    Comment: Performed at [facility], [address] [place], [place] [address]  Basic metabolic panel     Status: Abnormal   Collection Time: 04/17/21  9:59 AM  Result Value Ref Range   Sodium 135 135 - 145 mmol/L   Potassium 4.6 3.5 - 5.1 mmol/L   Chloride 105 98 - 111 mmol/L   CO2 22 22 - 32 mmol/L   Glucose, Bld 230 (H) 70 - 99 mg/dL    Comment: Glucose reference range applies only to samples taken after fasting for at least 8 hours.   BUN 30 (H) 6 - 20 mg/dL   Creatinine, Ser 1.51 (H) 0.61 - 1.24 mg/dL   Calcium 9.1 8.9 - 76.1 mg/dL   GFR, Estimated 52 (L) >60 mL/min    Comment: (NOTE) Calculated using the CKD-EPI Creatinine Equation (2021)    Anion gap 8 5 - 15    Comment: Performed at [facility], [address] [place], [place] [address]    No results found.  Review of Systems  Constitutional: Negative for chills, diaphoresis and fever.  HENT: Negative for ear discharge, ear pain, hearing loss and tinnitus.   Eyes: Negative for photophobia and pain.  Respiratory: Negative for cough and shortness of breath.   Cardiovascular: Negative for chest pain.  Gastrointestinal: Negative  for abdominal pain, nausea and vomiting.  Genitourinary: Negative for dysuria, flank pain, frequency and urgency.  Musculoskeletal: Positive for arthralgias (Right foot). Negative for back pain, myalgias and neck pain.  Neurological: Negative for dizziness and headaches.  Hematological: Does not bruise/bleed easily.  Psychiatric/Behavioral: The patient is not nervous/anxious.    Blood pressure (!) 136/108, pulse 85, temperature 97.9 F (36.6 C), temperature source Oral, resp. rate 17, height 6' (1.829 m), weight 106.6 kg, SpO2 100 %. Physical Exam Constitutional:      General: He is not in acute distress.    Appearance: He is well-developed. He is not diaphoretic.  HENT:     Head: Normocephalic and atraumatic.  Eyes:     General: No scleral icterus.       Right eye: No discharge.        Left eye: No  discharge.     Conjunctiva/sclera: Conjunctivae normal.  Cardiovascular:     Rate and Rhythm: Normal rate and regular rhythm.  Pulmonary:     Effort: Pulmonary effort is normal. No respiratory distress.  Musculoskeletal:     Cervical back: Normal range of motion.     Comments: RLE No traumatic wounds, ecchymosis, or rash  Mild TTP sole, fluctuant with foul odor  No knee or ankle effusion  Knee stable to varus/ valgus and anterior/posterior stress  Sens DPN, SPN, TN intact  Motor EHL, ext, flex, evers 5/5  DP 2+, PT 0, 1+ NP edema  Skin:    General: Skin is warm and dry.  Neurological:     Mental Status: He is alert.  Psychiatric:        Behavior: Behavior normal.     Assessment/Plan: Right foot infection -- MRI reassuring, but probably needs operative I&D. Dr. Lajoyce Corners to evaluate later today or in AM. Will make NPO after MN in case Dr. Lajoyce Corners wants to add to schedule. Multiple medical problems including poorly controlled insulin-dependent type 2 diabetes, hypertension, hyperlipidemia, and alcohol abuse -- per primary service    Freeman Caldron, PA-C Orthopedic  Surgery [phone] 04/17/2021, 4:07 PM

## 2021-04-17 NOTE — Progress Notes (Addendum)
Pharmacy Antibiotic Note  Jesse [name] is a 60 y.o. male admitted on 04/17/2021 with wound infection.  Pharmacy has been consulted for vancomycin dosing.  Patient with 2 week history of worsening foot wound after stepping on nail. Wound is purulent. Has been admitted last week but left AMA. Now with mild leukocytosis, afebrile, Scr is at baseline around 1.54.  Plan: Vancomycin 2000mg  x1 loading dose Then vancomycin 1500mg  q24hr (estimated AUC 540 w/ Scr of 1.54) Follow up renal function and clinical status  Height: 6' (182.9 cm) Weight: 106.6 kg (235 lb) IBW/kg (Calculated) : 77.6  Temp (24hrs), Avg:98.3 F (36.8 C), Min:97.9 F (36.6 C), Max:98.8 F (37.1 C)  Recent Labs  Lab 04/14/21 1306 04/14/21 1506 04/14/21 1607 04/17/21 0959  WBC 12.1*  --  11.3* 10.7*  CREATININE 1.68*  --   --  1.54*  LATICACIDVEN 1.3 1.2  --   --     Estimated Creatinine Clearance: 66 mL/min (A) (by C-G formula based on SCr of 1.54 mg/dL (H)).    No Known Allergies  Antimicrobials this admission: Vancomycin 6/7 >> Cefepime 6/7 >>  Dose adjustments this admission: NA  Thank you for allowing pharmacy to be a part of this patient's care.  8/7, PharmD PGY1 Pharmacy Resident 04/17/2021 4:54 PM   ADDENDUM: Consulted for cefepime dosing Start cefepime 2g q8hr

## 2021-04-17 NOTE — H&P (Addendum)
History and Physical    Jesse [name] ZOX:[id number] DOB: [birth year] DOA: 04/17/2021  Referring MD/NP/PA: Franchot Heidelberg, PA-C PCP: Pcp, No  Patient coming from: home   Chief Complaint: Infected foot wound  I have personally briefly reviewed patient's old medical records in [place]   HPI: Jesse [name] is a 60 y.o. male with medical history significant of hypertension, hyperlipidemia, diabetes mellitus type 2, and alcohol abuse who presented with complaints of worsening right foot infection.  Records note patient was hospitalized 3/30 diagnosed with diabetes mellitus type 2 and right foot cellulitis treated with IV antibiotics and discharged home on Lantus.  Approximately 2 weeks ago patient reported that he had stepped on a nail.  Since that time patient reports that he has had swelling of his right foot, pain for which it makes it difficult for him to stand, and has been draining foul-smelling pus after he tried to open it on his own.  Patient was admitted to the hospitalist service on 5/29, but left AGAINST MEDICAL ADVICE.  Patient returned to the emergency department and was admitted 6/4-6/5, before leaving again [place].  Reported that he had an emergency at home that he had to leave. During that hospitalization he was able to have MRI of the right foot that showed no findings of osteomyelitis or drainable abscess and a plantar ulceration with swelling of the tissues with scattered gas. Since leaving hospital he reports that the foot was just not getting better.  Denies having any significant fever, chest pain, nausea, vomiting, or diarrhea.  In talking to the patient patient makes it known that he has had difficulty understanding how to check his blood sugars and to administer insulin.  He had not taking any medications that were prescribed previously as he reports that he did not understand what they were for and then lost them.  He reports previously missing his  follow-up appointments with primary care providers as well.  Patient reports that he normally drinks a quart of beer daily, but then states that he drinks a 40 ounce of beer per day on average.  His last drink was yesterday evening.  ED Course: Upon admission into the emergency department patient was seen to be afebrile with blood pressure 112/69 136/108, and all other vital signs maintained.  Labs significant for WBC 10.7, hemoglobin 12.1, BUN 30, creatinine 1.54, and glucose 230.  Patient was started on vancomycin and cefepime.  Orthopedics was consulted and plan to take patient for I&D tomorrow morning.   Review of Systems  Constitutional: Negative for fever.  Eyes: Negative for photophobia and pain.  Cardiovascular: Positive for leg swelling. Negative for chest pain.  Gastrointestinal: Negative for abdominal pain, nausea and vomiting.  Genitourinary: Negative for frequency.  Musculoskeletal: Positive for myalgias. Negative for falls.  Skin:       Positive for wound to the right foot with swelling.  Psychiatric/Behavioral: Positive for substance abuse.  All other systems reviewed and are negative.    Past Medical History:  Diagnosis Date  . Alcohol abuse   . Diabetes mellitus type 2 in obese ([place])   . HTN (hypertension)     No past surgical history on file.   reports that he has never smoked. He has never used smokeless tobacco. He reports current alcohol use of about 2.0 standard drinks of alcohol per week. He reports that he does not use drugs.  No Known Allergies  No family history on file.  Prior to  Admission medications   Medication Sig Start Date End Date Taking? Authorizing Provider  atorvastatin (LIPITOR) 40 MG tablet Take 1 tablet (40 mg total) by mouth daily. Patient not taking: Reported on 04/14/2021 02/10/21   Eulis Foster, MD  blood glucose meter kit and supplies Dispense based on patient and insurance preference. Use up to four times daily as directed.  (FOR ICD-10 E10.9, E11.9). 02/09/21   Simmons-Robinson, Riki Sheer, MD  Blood Glucose Monitoring Suppl (TRUE METRIX METER) w/Device KIT USE UP TO FOUR TIMES DAILY AS DIRECTED. (FOR ICD-10 E10.9, E11.9). 02/09/21 02/09/22  Simmons-Robinson, Riki Sheer, MD  doxycycline (VIBRA-TABS) 100 MG tablet TAKE 1 TABLET (100 MG TOTAL) BY MOUTH DAILY FOR 3 DAYS. Patient not taking: Reported on 04/14/2021 02/09/21 02/09/22  Simmons-Robinson, Makiera, MD  glucose blood test strip USE UP TO FOUR TIMES DAILY AS DIRECTED Patient taking differently: USE UP TO FOUR TIMES DAILY AS DIRECTED 02/09/21 02/09/22  Simmons-Robinson, Riki Sheer, MD  insulin glargine (LANTUS) 100 UNIT/ML Solostar Pen INJECT 10 UNITS INTO THE SKIN DAILY. Patient not taking: No sig reported 02/09/21 02/09/22  Simmons-Robinson, Riki Sheer, MD  Insulin Pen Needle 31G X 8 MM MISC USE AS DIRECTED 02/09/21 02/09/22  Simmons-Robinson, Riki Sheer, MD  losartan (COZAAR) 25 MG tablet TAKE 1 TABLET (25 MG TOTAL) BY MOUTH DAILY. Patient not taking: Reported on 04/14/2021 02/09/21 02/09/22  Simmons-Robinson, Riki Sheer, MD  TRUEplus Lancets 28G MISC USE UP TO FOUR TIMES DAILY AS DIRECTED 02/09/21 02/09/22  Simmons-Robinson, Riki Sheer, MD  glipiZIDE (GLUCOTROL) 5 MG tablet Take 1 tablet (5 mg total) by mouth daily before breakfast. Patient not taking: Reported on 01/14/2016 01/31/14 01/14/16  Delfina Redwood, MD    Physical Exam:  Constitutional: Middle-age male who appears to be in no acute Vitals:   04/17/21 0938 04/17/21 1255 04/17/21 1526  BP: 112/69 128/79 (!) 136/108  Pulse: 89 75 85  Resp: _0 Temp: 98.8 F (37.1 C) 98.2 F (36.8 C) 97.9 F (36.6 C)  TempSrc:  Oral Oral  SpO2: 98% 98% 100%  Weight:   106.6 kg  Height:   6' (1.829 m)   Eyes: PERRL, lids and conjunctivae normal ENMT: Mucous membranes are moist. Posterior pharynx clear of any exudate or lesions. Poor dentition Neck: normal, supple, no masses, no thyromegaly Respiratory: clear to auscultation bilaterally, no wheezing, no  crackles. Normal respiratory effort. No accessory muscle use.  Cardiovascular: Regular rate and rhythm, no murmurs / rubs / gallops.  Right foot swelling appreciated.  2+ pedal pulses. No carotid bruits.  Abdomen: no tenderness, no masses palpated. No hepatosplenomegaly. Bowel sounds positive.  Musculoskeletal: no clubbing / cyanosis. No joint deformity upper and lower extremities. Good ROM, no contractures. Normal muscle tone.  Skin: Large right plantar ulceration medial aspect of the midfoot  with purulent discharge and foul odor.  Smaller nickel size right plantar ulceration of the first digit.   Neurologic: CN 2-12 grossly intact. Sensation intact, DTR normal. Strength 5/5 in all 4.  Psychiatric: Poor insight. Alert and oriented x 3. Normal mood.     Labs on Admission: I have personally reviewed following labs and imaging studies  CBC: Recent Labs  Lab 04/14/21 1306 04/14/21 1607 04/17/21 0959  WBC 12.1* 11.3* 10.7*  NEUTROABS 8.6*  --  6.8  HGB 13.0 13.2 12.1*  HCT 39.3 41.0 37.4*  MCV 95.4 98.8 97.7  PLT 371 349 741   Basic Metabolic Panel: Recent Labs  Lab 04/14/21 1306 04/14/21 1607 04/17/21 0959  NA 132*  --  135  K 5.0  --  4.6  CL 100  --  105  CO2 22  --  22  GLUCOSE 259*  --  230*  BUN 30*  --  30*  CREATININE 1.68*  --  1.54*  CALCIUM 9.5  --  9.1  MG  --  1.9  --   PHOS  --  4.3  --    GFR: Estimated Creatinine Clearance: 66 mL/min (A) (by C-G formula based on SCr of 1.54 mg/dL (H)). Liver Function Tests: Recent Labs  Lab 04/14/21 1306  AST 21  ALT 31  ALKPHOS 108  BILITOT 0.6  PROT 8.3*  ALBUMIN 2.9*   No results for input(s): LIPASE, AMYLASE in the last 168 hours. No results for input(s): AMMONIA in the last 168 hours. Coagulation Profile: No results for input(s): INR, PROTIME in the last 168 hours. Cardiac Enzymes: No results for input(s): CKTOTAL, CKMB, CKMBINDEX, TROPONINI in the last 168 hours. BNP (last 3 results) No results for  input(s): PROBNP in the last 8760 hours. HbA1C: No results for input(s): HGBA1C in the last 72 hours. CBG: Recent Labs  Lab 04/14/21 1650 04/14/21 2230  GLUCAP 212* 158*   Lipid Profile: No results for input(s): CHOL, HDL, LDLCALC, TRIG, CHOLHDL, LDLDIRECT in the last 72 hours. Thyroid Function Tests: No results for input(s): TSH, T4TOTAL, FREET4, T3FREE, THYROIDAB in the last 72 hours. Anemia Panel: No results for input(s): VITAMINB12, FOLATE, FERRITIN, TIBC, IRON, RETICCTPCT in the last 72 hours. Urine analysis:    Component Value Date/Time   COLORURINE YELLOW 02/07/2021 1521   APPEARANCEUR CLEAR 02/07/2021 1521   LABSPEC 1.014 02/07/2021 1521   PHURINE 5.0 02/07/2021 1521   GLUCOSEU 150 (A) 02/07/2021 1521   HGBUR SMALL (A) 02/07/2021 1521   BILIRUBINUR NEGATIVE 02/07/2021 1521   KETONESUR NEGATIVE 02/07/2021 1521   PROTEINUR NEGATIVE 02/07/2021 1521   NITRITE NEGATIVE 02/07/2021 1521   LEUKOCYTESUR NEGATIVE 02/07/2021 1521   Sepsis Labs: Recent Results (from the past 240 hour(s))  Blood culture (routine x 2)     Status: None   Collection Time: 04/08/21  8:22 PM   Specimen: BLOOD  Result Value Ref Range Status   Specimen Description   Final    BLOOD RIGHT ANTECUBITAL Performed at [facility], [place] [address], [place], [place] [address]    Special Requests   Final    BOTTLES DRAWN AEROBIC AND ANAEROBIC Blood Culture adequate volume Performed at [place] [address] [place], [place] [address]    Culture   Final    NO GROWTH 5 DAYS Performed at [facility], [place] [address] [place], [place] [address]    Report Status 04/14/2021 FINAL  Final  SARS CORONAVIRUS 2 (TAT 6-24 HRS) Nasopharyngeal Nasopharyngeal Swab     Status: None   Collection Time: 04/08/21  8:24 PM   Specimen: Nasopharyngeal Swab  Result Value Ref Range Status   SARS Coronavirus 2 NEGATIVE NEGATIVE Final    Comment: (NOTE) SARS-CoV-2 target  nucleic acids are NOT DETECTED.  The SARS-CoV-2 RNA is generally detectable in upper and lower respiratory specimens during the acute phase of infection. Negative results do not preclude SARS-CoV-2 infection, do not rule out co-infections with other pathogens, and should not be used as the sole basis for treatment or other patient management decisions. Negative results must be combined with clinical observations, patient history, and epidemiological information. The expected result is Negative.  Fact Sheet for Patients: SugarRoll.be  Fact Sheet for Healthcare Providers: https://www.woods-mathews.com/  This test is not yet approved or cleared by the [place] FDA and  has been authorized for detection and/or diagnosis of SARS-CoV-2 by FDA under an Emergency Use Authorization (EUA). This EUA will remain  in effect (meaning this test can be used) for the duration of the COVID-19 declaration under Se ction 564(b)(1) of the Act, 21 U.S.C. section 360bbb-3(b)(1), unless the authorization is terminated or revoked sooner.  Performed at [facility], [place] [address] [place], [place] [address]   Blood culture (routine x 2)     Status: None   Collection Time: 04/08/21  8:27 PM   Specimen: BLOOD  Result Value Ref Range Status   Specimen Description   Final    BLOOD LEFT ANTECUBITAL Performed at [place] [address] [place], [place] [address]    Special Requests   Final    BOTTLES DRAWN AEROBIC AND ANAEROBIC Blood Culture adequate volume Performed at [place] [address], [place], [place] [address]    Culture   Final    NO GROWTH 5 DAYS Performed at [facility], [place] [address] [place], [place] [address]    Report Status 04/14/2021 FINAL  Final  Culture, blood (routine x 2)     Status: None (Preliminary result)   Collection Time: 04/14/21  4:30 PM   Specimen: Right  Antecubital; Blood  Result Value Ref Range Status   Specimen Description RIGHT ANTECUBITAL  Final   Special Requests   Final    BOTTLES DRAWN AEROBIC AND ANAEROBIC Blood Culture adequate volume   Culture   Final    NO GROWTH 3 DAYS Performed at [facility], [place] [address] [place], [place] [address]    Report Status PENDING  Incomplete  Resp Panel by RT-PCR (Flu A&B, Covid) Nasopharyngeal Swab     Status: None   Collection Time: 04/14/21  4:30 PM   Specimen: Nasopharyngeal Swab; Nasopharyngeal(NP) swabs in vial transport medium  Result Value Ref Range Status   SARS Coronavirus 2 by RT PCR NEGATIVE NEGATIVE Final    Comment: (NOTE) SARS-CoV-2 target nucleic acids are NOT DETECTED.  The SARS-CoV-2 RNA is generally detectable in upper respiratory specimens during the acute phase of infection. The lowest concentration of SARS-CoV-2 viral copies this assay can detect is 138 copies/mL. A negative result does not preclude SARS-Cov-2 infection and should not be used as the sole basis for treatment or other patient management decisions. A negative result may occur with  improper specimen collection/handling, submission of specimen other than nasopharyngeal swab, presence of viral mutation(s) within the areas targeted by this assay, and inadequate number of viral copies(<138 copies/mL). A negative result must be combined with clinical observations, patient history, and epidemiological information. The expected result is Negative.  Fact Sheet for Patients:  EntrepreneurPulse.com.au  Fact Sheet for Healthcare Providers:  IncredibleEmployment.be  This test is no t yet approved or cleared by the [place] FDA and  has been authorized for detection and/or diagnosis of SARS-CoV-2 by FDA under an Emergency Use Authorization (EUA). This EUA will remain  in effect (meaning this test can be used) for the duration of the COVID-19 declaration under  Section 564(b)(1) of the Act, 21 U.S.C.section 360bbb-3(b)(1), unless the authorization is terminated  or revoked sooner.       Influenza A by PCR NEGATIVE NEGATIVE Final   Influenza B by PCR NEGATIVE NEGATIVE Final  Comment: (NOTE) The Xpert Xpress SARS-CoV-2/FLU/RSV plus assay is intended as an aid in the diagnosis of influenza from Nasopharyngeal swab specimens and should not be used as a sole basis for treatment. Nasal washings and aspirates are unacceptable for Xpert Xpress SARS-CoV-2/FLU/RSV testing.  Fact Sheet for Patients: EntrepreneurPulse.com.au  Fact Sheet for Healthcare Providers: IncredibleEmployment.be  This test is not yet approved or cleared by the [place] FDA and has been authorized for detection and/or diagnosis of SARS-CoV-2 by FDA under an Emergency Use Authorization (EUA). This EUA will remain in effect (meaning this test can be used) for the duration of the COVID-19 declaration under Section 564(b)(1) of the Act, 21 U.S.C. section 360bbb-3(b)(1), unless the authorization is terminated or revoked.  Performed at [facility], [place] [address], [place], [place] [address]   Resp Panel by RT-PCR (Flu A&B, Covid) Nasopharyngeal Swab     Status: None   Collection Time: 04/17/21  9:40 AM   Specimen: Nasopharyngeal Swab; Nasopharyngeal(NP) swabs in vial transport medium  Result Value Ref Range Status   SARS Coronavirus 2 by RT PCR NEGATIVE NEGATIVE Final    Comment: (NOTE) SARS-CoV-2 target nucleic acids are NOT DETECTED.  The SARS-CoV-2 RNA is generally detectable in upper respiratory specimens during the acute phase of infection. The lowest concentration of SARS-CoV-2 viral copies this assay can detect is 138 copies/mL. A negative result does not preclude SARS-Cov-2 infection and should not be used as the sole basis for treatment or other patient management decisions. A negative result may occur with   improper specimen collection/handling, submission of specimen other than nasopharyngeal swab, presence of viral mutation(s) within the areas targeted by this assay, and inadequate number of viral copies(<138 copies/mL). A negative result must be combined with clinical observations, patient history, and epidemiological information. The expected result is Negative.  Fact Sheet for Patients:  EntrepreneurPulse.com.au  Fact Sheet for Healthcare Providers:  IncredibleEmployment.be  This test is no t yet approved or cleared by the [place] FDA and  has been authorized for detection and/or diagnosis of SARS-CoV-2 by FDA under an Emergency Use Authorization (EUA). This EUA will remain  in effect (meaning this test can be used) for the duration of the COVID-19 declaration under Section 564(b)(1) of the Act, 21 U.S.C.section 360bbb-3(b)(1), unless the authorization is terminated  or revoked sooner.       Influenza A by PCR NEGATIVE NEGATIVE Final   Influenza B by PCR NEGATIVE NEGATIVE Final    Comment: (NOTE) The Xpert Xpress SARS-CoV-2/FLU/RSV plus assay is intended as an aid in the diagnosis of influenza from Nasopharyngeal swab specimens and should not be used as a sole basis for treatment. Nasal washings and aspirates are unacceptable for Xpert Xpress SARS-CoV-2/FLU/RSV testing.  Fact Sheet for Patients: EntrepreneurPulse.com.au  Fact Sheet for Healthcare Providers: IncredibleEmployment.be  This test is not yet approved or cleared by the [place] FDA and has been authorized for detection and/or diagnosis of SARS-CoV-2 by FDA under an Emergency Use Authorization (EUA). This EUA will remain in effect (meaning this test can be used) for the duration of the COVID-19 declaration under Section 564(b)(1) of the Act, 21 U.S.C. section 360bbb-3(b)(1), unless the authorization is terminated  or revoked.  Performed at [facility], [place] [address] [place], [place] [address]      Radiological Exams on Admission: No results found.   Assessment/Plan Infected diabetic foot ulcer with cellulitis: Acute.  Patient presents again to the hospital for continued infected right diabetic  foot ulcer.  MRI of the right foot from 6/4 did not give concern for osteomyelitis.  Orthopedics was consulted with plan for possible I&D tomorrow morning. -Admit to MedSurg bed -N.p.o. after midnight -Continue empiric antibiotics of vancomycin and cefepime -Wound care consult -Appreciate orthopedic consultative services, we will follow-up for any further recommendation  Leukocytosis: WBC 10.7 and appears to be trending down from previous 11.3 on 6/4.  He is otherwise afebrile and does not meet SIRS criteria. -Recheck CBC tomorrow morning  Diabetes mellitus type 2, uncontrolled: Last hemoglobin A1c was 10.9 on 04/14/2021.  Patient presents with glucose elevated up to 230.  Patient had not been taking insulin or checking his blood sugars as he did not understand how to do so.  He was recommended to be on Lantus 10 units daily during previous discharge. -Hypoglycemic protocol -Carb modified diet -Restart Lantus 10 units daily -CBGs before every meal with sensitive SSI -Diabetic education consulted  Acute kidney injury: Creatinine 1.54 with BUN 31.  Suspect prerenal cause of symptoms. -Normal saline IV fluids at 75 mL/h x 12 hours -Recheck kidney function tomorrow morning  Essential hypertension: On admission blood pressures elevated up to 136/108.  Home blood pressure medication previously prescribed include losartan 25 mg daily. -Restart losartan  Hyperlipidemia: Last LDL was 152 on 02/08/2021.  Patient was recommended to resume Lipitor during last admissions. -Restart atorvastatin -Continue to explained to the patient need of continuing this medication needs discharge  Suspected  intellectual disability: Patient reported having issues with understanding instructions given during previous discharges from the hospital. -Social work consulted possible need for place  Alcohol abuse: Patient reports possibly drinking 32 ounces or 40 ounces of beer per day on average. -CIWA protocols initiated  Obesity: BMI 31.87 kg/m  DVT prophylaxis: Lovenox Code Status: Full Family Communication:  None requested. Called patient's sister, but her mailbox was full. Disposition Plan: To be determined Consults called: Orthopedics Admission status: Inpatient, likely require more than 2 midnight stay  Norval Morton MD [facility]   If 7PM-7AM, please contact night-coverage   04/17/2021, 4:27 PM

## 2021-04-18 ENCOUNTER — Encounter (HOSPITAL_COMMUNITY): Payer: Self-pay | Admitting: Internal Medicine

## 2021-04-18 ENCOUNTER — Encounter (HOSPITAL_COMMUNITY)
Admission: EM | Disposition: A | Discharge status: Home / Self Care | Payer: Self-pay | Source: Home / Self Care | Attending: Family Medicine

## 2021-04-18 ENCOUNTER — Other Ambulatory Visit: Payer: Self-pay | Admitting: Physician Assistant

## 2021-04-18 ENCOUNTER — Inpatient Hospital Stay (HOSPITAL_COMMUNITY): Payer: Self-pay | Admitting: Certified Registered"

## 2021-04-18 DIAGNOSIS — L089 Local infection of the skin and subcutaneous tissue, unspecified: Secondary | ICD-10-CM

## 2021-04-18 DIAGNOSIS — L03115 Cellulitis of right lower limb: Secondary | ICD-10-CM

## 2021-04-18 DIAGNOSIS — E11628 Type 2 diabetes mellitus with other skin complications: Secondary | ICD-10-CM

## 2021-04-18 DIAGNOSIS — E1165 Type 2 diabetes mellitus with hyperglycemia: Secondary | ICD-10-CM

## 2021-04-18 HISTORY — PX: I & D EXTREMITY: SHX5045

## 2021-04-18 LAB — COMPREHENSIVE METABOLIC PANEL
ALT: 27 U/L (ref 0–44)
AST: 21 U/L (ref 15–41)
Albumin: 2.9 g/dL — ABNORMAL LOW (ref 3.5–5.0)
Alkaline Phosphatase: 94 U/L (ref 38–126)
Anion gap: 10 (ref 5–15)
BUN: 22 mg/dL — ABNORMAL HIGH (ref 6–20)
CO2: 22 mmol/L (ref 22–32)
Calcium: 9.5 mg/dL (ref 8.9–10.3)
Chloride: 105 mmol/L (ref 98–111)
Creatinine, Ser: 1.29 mg/dL — ABNORMAL HIGH (ref 0.61–1.24)
GFR, Estimated: >60 mL/min (ref >60)
Glucose, Bld: 195 mg/dL — ABNORMAL HIGH (ref 70–99)
Potassium: 4.9 mmol/L (ref 3.5–5.1)
Sodium: 137 mmol/L (ref 135–145)
Total Bilirubin: 0.3 mg/dL (ref 0.3–1.2)
Total Protein: 7.8 g/dL (ref 6.5–8.1)

## 2021-04-18 LAB — CBG MONITORING, ED: Glucose-Capillary: 187 mg/dL — ABNORMAL HIGH (ref 70–99)

## 2021-04-18 LAB — CBC
HCT: 40.4 % (ref 39.0–52.0)
Hemoglobin: 13.2 g/dL (ref 13.0–17.0)
MCH: 31.7 pg (ref 26.0–34.0)
MCHC: 32.7 g/dL (ref 30.0–36.0)
MCV: 97.1 fL (ref 80.0–100.0)
Platelets: 322 10*3/uL (ref 150–400)
RBC: 4.16 MIL/uL — ABNORMAL LOW (ref 4.22–5.81)
RDW: 12.8 % (ref 11.5–15.5)
WBC: 8.1 10*3/uL (ref 4.0–10.5)
nRBC: 0 % (ref 0.0–0.2)

## 2021-04-18 LAB — MAGNESIUM: Magnesium: 1.8 mg/dL (ref 1.7–2.4)

## 2021-04-18 LAB — GLUCOSE, CAPILLARY
Glucose-Capillary: 182 mg/dL — ABNORMAL HIGH (ref 70–99)
Glucose-Capillary: 194 mg/dL — ABNORMAL HIGH (ref 70–99)
Glucose-Capillary: 189 mg/dL — ABNORMAL HIGH (ref 70–99)
Glucose-Capillary: 243 mg/dL — ABNORMAL HIGH (ref 70–99)

## 2021-04-18 LAB — PHOSPHORUS: Phosphorus: 3.7 mg/dL (ref 2.5–4.6)

## 2021-04-18 SURGERY — IRRIGATION AND DEBRIDEMENT EXTREMITY
Anesthesia: General | Laterality: Right

## 2021-04-18 MED ORDER — ACETAMINOPHEN 10 MG/ML IV SOLN: 1000.0000 mg | Freq: Once | INTRAVENOUS | Status: DC | PRN

## 2021-04-18 MED ORDER — ORAL CARE MOUTH RINSE: 15.0000 mL | Freq: Once | OROMUCOSAL | Status: AC

## 2021-04-18 MED ORDER — FENTANYL CITRATE (PF) 250 MCG/5ML IJ SOLN
INTRAMUSCULAR | Status: AC
  Filled 2021-04-18: qty 5

## 2021-04-18 MED ORDER — METOCLOPRAMIDE HCL 5 MG PO TABS
5.0000 mg | ORAL_TABLET | Freq: Three times a day (TID) | ORAL | Status: DC | PRN
Start: 2021-04-18 — End: 2021-04-23

## 2021-04-18 MED ORDER — ONDANSETRON HCL 4 MG/2ML IJ SOLN
INTRAMUSCULAR | Status: AC
  Filled 2021-04-18: qty 2

## 2021-04-18 MED ORDER — DOCUSATE SODIUM 100 MG PO CAPS
100.0000 mg | ORAL_CAPSULE | Freq: Two times a day (BID) | ORAL | Status: DC
  Administered 2021-04-19 – 2021-04-23 (×8): 100 mg via ORAL
  Filled 2021-04-18 (×8): qty 1

## 2021-04-18 MED ORDER — PHENYLEPHRINE 40 MCG/ML (10ML) SYRINGE FOR IV PUSH (FOR BLOOD PRESSURE SUPPORT)
PREFILLED_SYRINGE | INTRAVENOUS | Status: AC
  Filled 2021-04-18: qty 10

## 2021-04-18 MED ORDER — METOCLOPRAMIDE HCL 5 MG/ML IJ SOLN: 5.0000 mg | Freq: Three times a day (TID) | INTRAMUSCULAR | Status: DC | PRN

## 2021-04-18 MED ORDER — PROPOFOL 10 MG/ML IV BOLUS
INTRAVENOUS | Status: AC
  Filled 2021-04-18: qty 20

## 2021-04-18 MED ORDER — CHLORHEXIDINE GLUCONATE 4 % EX LIQD: 60.0000 mL | Freq: Once | CUTANEOUS | Status: DC

## 2021-04-18 MED ORDER — LIDOCAINE HCL (PF) 2 % IJ SOLN
INTRAMUSCULAR | Status: AC
  Filled 2021-04-18: qty 10

## 2021-04-18 MED ORDER — PHENYLEPHRINE 40 MCG/ML (10ML) SYRINGE FOR IV PUSH (FOR BLOOD PRESSURE SUPPORT)
PREFILLED_SYRINGE | INTRAVENOUS | Status: DC | PRN
  Administered 2021-04-18 (×3): 120 ug via INTRAVENOUS

## 2021-04-18 MED ORDER — CEFAZOLIN SODIUM-DEXTROSE 2-4 GM/100ML-% IV SOLN: 2.0000 g | INTRAVENOUS | Status: DC

## 2021-04-18 MED ORDER — LACTATED RINGERS IV SOLN: INTRAVENOUS | Status: DC

## 2021-04-18 MED ORDER — AMISULPRIDE (ANTIEMETIC) 5 MG/2ML IV SOLN: 10.0000 mg | Freq: Once | INTRAVENOUS | Status: DC | PRN

## 2021-04-18 MED ORDER — PHENYLEPHRINE HCL-NACL 10-0.9 MG/250ML-% IV SOLN
INTRAVENOUS | Status: DC | PRN
  Administered 2021-04-18: 40 ug/min via INTRAVENOUS

## 2021-04-18 MED ORDER — POVIDONE-IODINE 10 % EX SWAB
2.0000 "application " | Freq: Once | CUTANEOUS | Status: AC
  Administered 2021-04-18: 2 via TOPICAL

## 2021-04-18 MED ORDER — MIDAZOLAM HCL 2 MG/2ML IJ SOLN
INTRAMUSCULAR | Status: AC
  Filled 2021-04-18: qty 2

## 2021-04-18 MED ORDER — SODIUM CHLORIDE 0.9 % IR SOLN
Status: DC | PRN
  Administered 2021-04-18: 1000 mL

## 2021-04-18 MED ORDER — CEFAZOLIN SODIUM-DEXTROSE 2-4 GM/100ML-% IV SOLN
2.0000 g | Freq: Four times a day (QID) | INTRAVENOUS | Status: AC
  Administered 2021-04-18 – 2021-04-19 (×3): 2 g via INTRAVENOUS
  Filled 2021-04-18 (×3): qty 100

## 2021-04-18 MED ORDER — PROMETHAZINE HCL 25 MG/ML IJ SOLN: 6.2500 mg | INTRAMUSCULAR | Status: DC | PRN

## 2021-04-18 MED ORDER — HYDROMORPHONE HCL 1 MG/ML IJ SOLN: 0.5000 mg | INTRAMUSCULAR | Status: DC | PRN

## 2021-04-18 MED ORDER — PROPOFOL 10 MG/ML IV BOLUS
INTRAVENOUS | Status: DC | PRN
  Administered 2021-04-18: 200 mg via INTRAVENOUS

## 2021-04-18 MED ORDER — FENTANYL CITRATE (PF) 100 MCG/2ML IJ SOLN
INTRAMUSCULAR | Status: AC
  Filled 2021-04-18: qty 2

## 2021-04-18 MED ORDER — OXYCODONE HCL 5 MG/5ML PO SOLN
5.0000 mg | Freq: Once | ORAL | Status: DC | PRN
Start: 2021-04-18 — End: 2021-04-18

## 2021-04-18 MED ORDER — LIDOCAINE 2% (20 MG/ML) 5 ML SYRINGE
INTRAMUSCULAR | Status: DC | PRN
  Administered 2021-04-18: 40 mg via INTRAVENOUS

## 2021-04-18 MED ORDER — OXYCODONE HCL 5 MG PO TABS: 5.0000 mg | ORAL_TABLET | Freq: Once | ORAL | Status: DC | PRN

## 2021-04-18 MED ORDER — DEXAMETHASONE SODIUM PHOSPHATE 10 MG/ML IJ SOLN
INTRAMUSCULAR | Status: DC | PRN
  Administered 2021-04-18: 5 mg via INTRAVENOUS

## 2021-04-18 MED ORDER — CEFAZOLIN SODIUM-DEXTROSE 2-4 GM/100ML-% IV SOLN
2.0000 g | INTRAVENOUS | Status: AC
  Administered 2021-04-18: 2 g via INTRAVENOUS
  Filled 2021-04-18: qty 100

## 2021-04-18 MED ORDER — CHLORHEXIDINE GLUCONATE 0.12 % MT SOLN: 15.0000 mL | Freq: Once | OROMUCOSAL | Status: AC

## 2021-04-18 MED ORDER — FENTANYL CITRATE (PF) 100 MCG/2ML IJ SOLN
25.0000 ug | INTRAMUSCULAR | Status: DC | PRN
  Administered 2021-04-18 (×2): 50 ug via INTRAVENOUS

## 2021-04-18 MED ORDER — DEXAMETHASONE SODIUM PHOSPHATE 10 MG/ML IJ SOLN
INTRAMUSCULAR | Status: AC
  Filled 2021-04-18: qty 2

## 2021-04-18 MED ORDER — ONDANSETRON HCL 4 MG/2ML IJ SOLN
INTRAMUSCULAR | Status: AC
  Filled 2021-04-18: qty 4

## 2021-04-18 MED ORDER — FENTANYL CITRATE (PF) 250 MCG/5ML IJ SOLN
INTRAMUSCULAR | Status: DC | PRN
  Administered 2021-04-18: 50 ug via INTRAVENOUS

## 2021-04-18 MED ORDER — ONDANSETRON HCL 4 MG/2ML IJ SOLN
INTRAMUSCULAR | Status: DC | PRN
  Administered 2021-04-18: 4 mg via INTRAVENOUS

## 2021-04-18 MED ORDER — MIDAZOLAM HCL 5 MG/5ML IJ SOLN
INTRAMUSCULAR | Status: DC | PRN
  Administered 2021-04-18: 2 mg via INTRAVENOUS

## 2021-04-18 MED ORDER — DEXAMETHASONE SODIUM PHOSPHATE 10 MG/ML IJ SOLN
INTRAMUSCULAR | Status: AC
  Filled 2021-04-18: qty 1

## 2021-04-18 MED ORDER — OXYCODONE HCL 5 MG PO TABS
5.0000 mg | ORAL_TABLET | ORAL | Status: DC | PRN
  Administered 2021-04-18 – 2021-04-19 (×2): 10 mg via ORAL
  Administered 2021-04-19: 5 mg via ORAL
  Administered 2021-04-19 – 2021-04-20 (×2): 10 mg via ORAL
  Filled 2021-04-18 (×3): qty 2
  Filled 2021-04-18: qty 1
  Filled 2021-04-18 (×2): qty 2

## 2021-04-18 MED ORDER — CHLORHEXIDINE GLUCONATE 0.12 % MT SOLN
OROMUCOSAL | Status: AC
  Administered 2021-04-18: 15 mL via OROMUCOSAL
  Filled 2021-04-18: qty 15

## 2021-04-18 MED ORDER — SODIUM CHLORIDE 0.9 % IV SOLN: INTRAVENOUS | Status: DC

## 2021-04-18 SURGICAL SUPPLY — 36 items
BLADE SURG 21 STRL SS (BLADE) ×2 IMPLANT
BNDG COHESIVE 6X5 TAN STRL LF (GAUZE/BANDAGES/DRESSINGS) IMPLANT
BNDG GAUZE ELAST 4 BULKY (GAUZE/BANDAGES/DRESSINGS) ×2 IMPLANT
CANISTER WOUNDNEG PRESSURE 500 (CANNISTER) ×1 IMPLANT
COVER SURGICAL LIGHT HANDLE (MISCELLANEOUS) ×4 IMPLANT
COVER WAND RF STERILE (DRAPES) IMPLANT
DRAPE DERMATAC (DRAPES) ×3 IMPLANT
DRAPE U-SHAPE 47X51 STRL (DRAPES) ×2 IMPLANT
DRESSING VERAFLO CLEANSE CC (GAUZE/BANDAGES/DRESSINGS) IMPLANT
DRSG ADAPTIC 3X8 NADH LF (GAUZE/BANDAGES/DRESSINGS) ×2 IMPLANT
DRSG VERAFLO CLEANSE CC (GAUZE/BANDAGES/DRESSINGS) ×4
DURAPREP 26ML APPLICATOR (WOUND CARE) ×2 IMPLANT
ELECT REM PT RETURN 9FT ADLT (ELECTROSURGICAL)
ELECTRODE REM PT RTRN 9FT ADLT (ELECTROSURGICAL) IMPLANT
GAUZE SPONGE 4X4 12PLY STRL (GAUZE/BANDAGES/DRESSINGS) ×2 IMPLANT
GLOVE BIOGEL PI IND STRL 9 (GLOVE) ×1 IMPLANT
GLOVE BIOGEL PI INDICATOR 9 (GLOVE) ×1
GLOVE SURG ORTHO 9.0 STRL STRW (GLOVE) ×2 IMPLANT
GOWN STRL REUS W/ TWL XL LVL3 (GOWN DISPOSABLE) ×2 IMPLANT
GOWN STRL REUS W/TWL XL LVL3 (GOWN DISPOSABLE) ×4
HANDPIECE INTERPULSE COAX TIP (DISPOSABLE)
KIT BASIN OR (CUSTOM PROCEDURE TRAY) ×2 IMPLANT
KIT TURNOVER KIT B (KITS) ×2 IMPLANT
MANIFOLD NEPTUNE II (INSTRUMENTS) ×2 IMPLANT
NS IRRIG 1000ML POUR BTL (IV SOLUTION) ×2 IMPLANT
PACK ORTHO EXTREMITY (CUSTOM PROCEDURE TRAY) ×2 IMPLANT
PAD ARMBOARD 7.5X6 YLW CONV (MISCELLANEOUS) ×4 IMPLANT
PAD NEG PRESSURE SENSATRAC (MISCELLANEOUS) ×1 IMPLANT
SET HNDPC FAN SPRY TIP SCT (DISPOSABLE) IMPLANT
STOCKINETTE IMPERVIOUS 9X36 MD (GAUZE/BANDAGES/DRESSINGS) IMPLANT
SUT ETHILON 2 0 PSLX (SUTURE) ×2 IMPLANT
SWAB COLLECTION DEVICE MRSA (MISCELLANEOUS) ×2 IMPLANT
SWAB CULTURE ESWAB REG 1ML (MISCELLANEOUS) IMPLANT
TOWEL GREEN STERILE (TOWEL DISPOSABLE) ×2 IMPLANT
TUBE CONNECTING 12X1/4 (SUCTIONS) ×2 IMPLANT
YANKAUER SUCT BULB TIP NO VENT (SUCTIONS) ×2 IMPLANT

## 2021-04-18 NOTE — Consult Note (Signed)
ORTHOPAEDIC CONSULTATION  REQUESTING PHYSICIAN: Norval Morton, MD  Chief Complaint: Abscess ulceration right foot  HPI: Jesse [name] is a 60 y.o. male who presents with diabetic insensate neuropathy.  He states he stepped on a nail several weeks ago he states he was in bare feet did not have shoes on.  Patient reports progressive odor drainage and ulceration with pain.  Patient initially presented to the emergency room on 04/08/2021.  Patient left AMA and then returned to the emergency room on 04/14/2021.  Patient again left AMA and presents again yesterday with the same persistent abscess and purulent drainage ulceration plantar aspect of the right foot.  Has received diabetic medication in the past, he states he did not know how to use it and does not have a primary care physician.  Past Medical History:  Diagnosis Date  . Alcohol abuse   . Diabetes mellitus type 2 in obese ([place])   . HTN (hypertension)    No past surgical history on file. Social History   Socioeconomic History  . Marital status: Single    Spouse name: Not on file  . Number of children: Not on file  . Years of education: Not on file  . Highest education level: Not on file  Occupational History  . Not on file  Tobacco Use  . Smoking status: Never Smoker  . Smokeless tobacco: Never Used  Substance and Sexual Activity  . Alcohol use: Yes    Alcohol/week: 2.0 standard drinks    Types: 2 Cans of beer per week    Comment: Occasional  . Drug use: No  . Sexual activity: Not on file  Other Topics Concern  . Not on file  Social History Narrative  . Not on file   Social Determinants of Health   Financial Resource Strain: Not on file  Food Insecurity: Not on file  Transportation Needs: Not on file  Physical Activity: Not on file  Stress: Not on file  Social Connections: Not on file   No family history on file. - negative except otherwise stated in the family history section No Known  Allergies Prior to Admission medications   Medication Sig Start Date End Date Taking? Authorizing Provider  atorvastatin (LIPITOR) 40 MG tablet Take 1 tablet (40 mg total) by mouth daily. Patient not taking: Reported on 04/17/2021 02/10/21   Eulis Foster, MD  blood glucose meter kit and supplies Dispense based on patient and insurance preference. Use up to four times daily as directed. (FOR ICD-10 E10.9, E11.9). 02/09/21   Simmons-Robinson, Riki Sheer, MD  Blood Glucose Monitoring Suppl (TRUE METRIX METER) w/Device KIT USE UP TO FOUR TIMES DAILY AS DIRECTED. (FOR ICD-10 E10.9, E11.9). 02/09/21 02/09/22  Simmons-Robinson, Riki Sheer, MD  doxycycline (VIBRA-TABS) 100 MG tablet TAKE 1 TABLET (100 MG TOTAL) BY MOUTH DAILY FOR 3 DAYS. Patient not taking: No sig reported 02/09/21 02/09/22  Simmons-Robinson, Makiera, MD  glucose blood test strip USE UP TO FOUR TIMES DAILY AS DIRECTED Patient taking differently: USE UP TO FOUR TIMES DAILY AS DIRECTED 02/09/21 02/09/22  Simmons-Robinson, Riki Sheer, MD  insulin glargine (LANTUS) 100 UNIT/ML Solostar Pen INJECT 10 UNITS INTO THE SKIN DAILY. Patient not taking: No sig reported 02/09/21 02/09/22  Simmons-Robinson, Riki Sheer, MD  Insulin Pen Needle 31G X 8 MM MISC USE AS DIRECTED 02/09/21 02/09/22  Simmons-Robinson, Riki Sheer, MD  losartan (COZAAR) 25 MG tablet TAKE 1 TABLET (25 MG TOTAL) BY MOUTH DAILY. Patient not taking: Reported on 04/17/2021 02/09/21 02/09/22  Eulis Foster, MD  TRUEplus Lancets 28G MISC USE UP TO FOUR TIMES DAILY AS DIRECTED 02/09/21 02/09/22  Simmons-Robinson, Riki Sheer, MD  glipiZIDE (GLUCOTROL) 5 MG tablet Take 1 tablet (5 mg total) by mouth daily before breakfast. Patient not taking: Reported on 01/14/2016 01/31/14 01/14/16  Delfina Redwood, MD   No results found. - pertinent xrays, CT, MRI studies were reviewed and independently interpreted  Positive ROS: All other systems have been reviewed and were otherwise negative with the exception of those  mentioned in the HPI and as above.  Physical Exam: General: Alert, no acute distress Psychiatric: Patient is competent for consent with normal mood and affect Lymphatic: No axillary or cervical lymphadenopathy Cardiovascular: No pedal edema Respiratory: No cyanosis, no use of accessory musculature GI: No organomegaly, abdomen is soft and non-tender    Images:  _0 @  Labs:  Lab Results  Component Value Date   HGBA1C 10.9 (H) 04/14/2021   HGBA1C 10.5 (H) 02/07/2021   HGBA1C 10.6 (H) 02/07/2021   ESRSEDRATE 108 (H) 04/08/2021   ESRSEDRATE 28 (H) 01/14/2016   CRP 27.2 (H) 04/08/2021   LABURIC 7.0 01/14/2016   REPTSTATUS PENDING 04/14/2021   CULT  04/14/2021    NO GROWTH 3 DAYS Performed at [facility], [place] [address], [place], [place] [address]     Lab Results  Component Value Date   ALBUMIN 2.9 (L) 04/14/2021   ALBUMIN 3.0 (L) 04/08/2021   ALBUMIN 3.5 02/07/2021   LABURIC 7.0 01/14/2016     CBC EXTENDED Latest Ref Rng & Units 04/17/2021 04/14/2021 04/14/2021  WBC 4.0 - 10.5 K/uL 10.7(H) 11.3(H) 12.1(H)  RBC 4.22 - 5.81 MIL/uL 3.83(L) 4.15(L) 4.12(L)  HGB 13.0 - 17.0 g/dL 12.1(L) 13.2 13.0  HCT 39.0 - 52.0 % 37.4(L) 41.0 39.3  PLT 150 - 400 K/uL 365 349 371  NEUTROABS 1.7 - 7.7 K/uL 6.8 - 8.6(H)  LYMPHSABS 0.7 - 4.0 K/uL 2.5 - 2.0    Neurologic: Patient does not have protective sensation bilateral lower extremities.   MUSCULOSKELETAL:   Skin: Examination patient has a large ulcer involving the plantar aspect of the right foot just distal to the heel pad.  There is purulent drainage there is a large deep tunneling ulcer.  Patient has a strong dorsalis pedis pulse bilaterally no ulcerations or swelling in the left foot there is swelling and tenderness to palpation in the right foot no calf tenderness to palpation.  Patient's hemoglobin A1c is 10.9.  Albumin 2.9.  MRI scan shows no abscess collection and no osteomyelitis.  Assessment: Assessment:  Abscess ulceration plantar aspect right foot.  Plan: We will plan for irrigation debridement surgically today.  Patient will need aggressive medical care and he is at high risk of limb loss due to the chronic nature of the abscess.  Patient will need a primary care physician for discharge and will need teaching for diabetic management.  Thank you for the consult and the opportunity to see Mr. Dorthula Rue, [place] [phone] 6:32 AM

## 2021-04-18 NOTE — Consult Note (Signed)
WOC Nurse Consult Note: Patient receiving care in [facility] ED40. Reason for Consult: infected diabetic heel ulcer Dr. Lajoyce Corners states he is taking patient to surgery today for this wound issue.  WOC did not see and will not follow. Helmut Muster, RN, MSN, CWOCN, CNS-BC, pager [phone]

## 2021-04-18 NOTE — ED Notes (Signed)
Attempted report x1. 

## 2021-04-18 NOTE — H&P (View-Only) (Signed)
ORTHOPAEDIC CONSULTATION  REQUESTING PHYSICIAN: Norval Morton, MD  Chief Complaint: Abscess ulceration right foot  HPI: Jesse [name] is a 60 y.o. male who presents with diabetic insensate neuropathy.  He states he stepped on a nail several weeks ago he states he was in bare feet did not have shoes on.  Patient reports progressive odor drainage and ulceration with pain.  Patient initially presented to the emergency room on 04/08/2021.  Patient left AMA and then returned to the emergency room on 04/14/2021.  Patient again left AMA and presents again yesterday with the same persistent abscess and purulent drainage ulceration plantar aspect of the right foot.  Has received diabetic medication in the past, he states he did not know how to use it and does not have a primary care physician.  Past Medical History:  Diagnosis Date  . Alcohol abuse   . Diabetes mellitus type 2 in obese ([place])   . HTN (hypertension)    No past surgical history on file. Social History   Socioeconomic History  . Marital status: Single    Spouse name: Not on file  . Number of children: Not on file  . Years of education: Not on file  . Highest education level: Not on file  Occupational History  . Not on file  Tobacco Use  . Smoking status: Never Smoker  . Smokeless tobacco: Never Used  Substance and Sexual Activity  . Alcohol use: Yes    Alcohol/week: 2.0 standard drinks    Types: 2 Cans of beer per week    Comment: Occasional  . Drug use: No  . Sexual activity: Not on file  Other Topics Concern  . Not on file  Social History Narrative  . Not on file   Social Determinants of Health   Financial Resource Strain: Not on file  Food Insecurity: Not on file  Transportation Needs: Not on file  Physical Activity: Not on file  Stress: Not on file  Social Connections: Not on file   No family history on file. - negative except otherwise stated in the family history section No Known  Allergies Prior to Admission medications   Medication Sig Start Date End Date Taking? Authorizing Provider  atorvastatin (LIPITOR) 40 MG tablet Take 1 tablet (40 mg total) by mouth daily. Patient not taking: Reported on 04/17/2021 02/10/21   Eulis Foster, MD  blood glucose meter kit and supplies Dispense based on patient and insurance preference. Use up to four times daily as directed. (FOR ICD-10 E10.9, E11.9). 02/09/21   Simmons-Robinson, Riki Sheer, MD  Blood Glucose Monitoring Suppl (TRUE METRIX METER) w/Device KIT USE UP TO FOUR TIMES DAILY AS DIRECTED. (FOR ICD-10 E10.9, E11.9). 02/09/21 02/09/22  Simmons-Robinson, Riki Sheer, MD  doxycycline (VIBRA-TABS) 100 MG tablet TAKE 1 TABLET (100 MG TOTAL) BY MOUTH DAILY FOR 3 DAYS. Patient not taking: No sig reported 02/09/21 02/09/22  Simmons-Robinson, Makiera, MD  glucose blood test strip USE UP TO FOUR TIMES DAILY AS DIRECTED Patient taking differently: USE UP TO FOUR TIMES DAILY AS DIRECTED 02/09/21 02/09/22  Simmons-Robinson, Riki Sheer, MD  insulin glargine (LANTUS) 100 UNIT/ML Solostar Pen INJECT 10 UNITS INTO THE SKIN DAILY. Patient not taking: No sig reported 02/09/21 02/09/22  Simmons-Robinson, Riki Sheer, MD  Insulin Pen Needle 31G X 8 MM MISC USE AS DIRECTED 02/09/21 02/09/22  Simmons-Robinson, Riki Sheer, MD  losartan (COZAAR) 25 MG tablet TAKE 1 TABLET (25 MG TOTAL) BY MOUTH DAILY. Patient not taking: Reported on 04/17/2021 02/09/21 02/09/22  Eulis Foster, MD  TRUEplus Lancets 28G MISC USE UP TO FOUR TIMES DAILY AS DIRECTED 02/09/21 02/09/22  Simmons-Robinson, Riki Sheer, MD  glipiZIDE (GLUCOTROL) 5 MG tablet Take 1 tablet (5 mg total) by mouth daily before breakfast. Patient not taking: Reported on 01/14/2016 01/31/14 01/14/16  Delfina Redwood, MD   No results found. - pertinent xrays, CT, MRI studies were reviewed and independently interpreted  Positive ROS: All other systems have been reviewed and were otherwise negative with the exception of those  mentioned in the HPI and as above.  Physical Exam: General: Alert, no acute distress Psychiatric: Patient is competent for consent with normal mood and affect Lymphatic: No axillary or cervical lymphadenopathy Cardiovascular: No pedal edema Respiratory: No cyanosis, no use of accessory musculature GI: No organomegaly, abdomen is soft and non-tender    Images:  _0 @  Labs:  Lab Results  Component Value Date   HGBA1C 10.9 (H) 04/14/2021   HGBA1C 10.5 (H) 02/07/2021   HGBA1C 10.6 (H) 02/07/2021   ESRSEDRATE 108 (H) 04/08/2021   ESRSEDRATE 28 (H) 01/14/2016   CRP 27.2 (H) 04/08/2021   LABURIC 7.0 01/14/2016   REPTSTATUS PENDING 04/14/2021   CULT  04/14/2021    NO GROWTH 3 DAYS Performed at [facility], [place] [address], [place], [place] [address]     Lab Results  Component Value Date   ALBUMIN 2.9 (L) 04/14/2021   ALBUMIN 3.0 (L) 04/08/2021   ALBUMIN 3.5 02/07/2021   LABURIC 7.0 01/14/2016     CBC EXTENDED Latest Ref Rng & Units 04/17/2021 04/14/2021 04/14/2021  WBC 4.0 - 10.5 K/uL 10.7(H) 11.3(H) 12.1(H)  RBC 4.22 - 5.81 MIL/uL 3.83(L) 4.15(L) 4.12(L)  HGB 13.0 - 17.0 g/dL 12.1(L) 13.2 13.0  HCT 39.0 - 52.0 % 37.4(L) 41.0 39.3  PLT 150 - 400 K/uL 365 349 371  NEUTROABS 1.7 - 7.7 K/uL 6.8 - 8.6(H)  LYMPHSABS 0.7 - 4.0 K/uL 2.5 - 2.0    Neurologic: Patient does not have protective sensation bilateral lower extremities.   MUSCULOSKELETAL:   Skin: Examination patient has a large ulcer involving the plantar aspect of the right foot just distal to the heel pad.  There is purulent drainage there is a large deep tunneling ulcer.  Patient has a strong dorsalis pedis pulse bilaterally no ulcerations or swelling in the left foot there is swelling and tenderness to palpation in the right foot no calf tenderness to palpation.  Patient's hemoglobin A1c is 10.9.  Albumin 2.9.  MRI scan shows no abscess collection and no osteomyelitis.  Assessment: Assessment:  Abscess ulceration plantar aspect right foot.  Plan: We will plan for irrigation debridement surgically today.  Patient will need aggressive medical care and he is at high risk of limb loss due to the chronic nature of the abscess.  Patient will need a primary care physician for discharge and will need teaching for diabetic management.  Thank you for the consult and the opportunity to see Mr. Dorthula Rue, [place] [phone] 6:32 AM

## 2021-04-18 NOTE — Evaluation (Signed)
Physical Therapy Evaluation Patient Details Name: Jesse [name] MRN: [id number] DOB: Nov 16, 1960 Today's Date: 04/18/2021   History of Present Illness  Pt is a 60 y.o. male who presented 6/7 with worsening R foot infection. Per chart, he was diagnosed with DM2 and R foot cellulitis 3/30 and has since stepped on a nail and has left AMA for the same concern at least 2x. Pt with poor medication compliance and understanding. S/p excisional debridement of R foot and application of wound vac 6/8. PMH: hypertension, hyperlipidemia, diabetes mellitus type 2, and alcohol abuse.    Clinical Impression  Pt presents with condition above and deficits mentioned below, see PT Problem List. PTA, he was living alone in a 1-level house with 2 STE without a handrail. Pt was independent and working as a Curator. Currently, pt demonstrates deficits in R lower extremity mobility, balance, activity tolerance, and comprehension of his medical condition and precautions. Pt is currently requiring up to min guard-minA for basic transfers and min guard for hop-through gait within his room utilizing a RW. Pt would likely do well managing crutches. If he continues to be unable to bear weight at d/c then he could benefit from a w/c for community mobility and 3-in-1 for safety with bathing and using the restroom. Pt also would also benefit from Outpatient PT pending his mobility progression. Pt could also benefit from education and training on how to manage/remember medications and doctor appointments. Will continue to follow acutely.    Follow Up Recommendations Outpatient PT;Supervision for mobility/OOB (pending progression)    Equipment Recommendations  Crutches;3in1 (PT);Wheelchair (measurements PT);Wheelchair cushion (measurements PT) (pending progression and weightbearing status)    Recommendations for Other Services       Precautions / Restrictions Precautions Precautions: Fall Precaution Comments: wound vac R  foot Restrictions Weight Bearing Restrictions: Yes RLE Weight Bearing: Non weight bearing      Mobility  Bed Mobility Overal bed mobility: Needs Assistance Bed Mobility: Supine to Sit     Supine to sit: Supervision;HOB elevated     General bed mobility comments: Supervision for safety, extra time to power up to sit EOB.    Transfers Overall transfer level: Needs assistance Equipment used: Rolling walker (2 wheeled) Transfers: Sit to/from [employer] Sit to Stand: Min assist Stand pivot transfers: Min guard       General transfer comment: MinA to steady with power up to stand, cuing pt for hand transition to RW. Min guard for hop with L leg to chair with use of RW.  Ambulation/Gait Ambulation/Gait assistance: Min guard Gait Distance (Feet): 13 Feet Assistive device: Rolling walker (2 wheeled) Gait Pattern/deviations: Trunk flexed (hop-through) Gait velocity: reduced Gait velocity interpretation: <1.31 ft/sec, indicative of household ambulator General Gait Details: Heavy use of UEs with pt maintaining R foot off ground to follow NWB precautions. Pt performing hop-through slowly and trunk flexed with min guard for safety. No overt LOB.  Stairs            Wheelchair Mobility    Modified Rankin (Stroke Patients Only)       Balance Overall balance assessment: Needs assistance Sitting-balance support: No upper extremity supported;Feet supported Sitting balance-Leahy Scale: Fair Sitting balance - Comments: Static sitting EOB with supervision for safety.   Standing balance support: Bilateral upper extremity supported;During functional activity Standing balance-Leahy Scale: Poor Standing balance comment: Reliant on bil UE support on RW.  Pertinent Vitals/Pain Pain Assessment: Faces Faces Pain Scale: Hurts a little bit Pain Location: R foot Pain Descriptors / Indicators: Grimacing;Guarding Pain  Intervention(s): Limited activity within patient's tolerance;Monitored during session;Repositioned    Home Living Family/patient expects to be discharged to:: Private residence Living Arrangements: Alone Available Help at Discharge: Family;Available 24 hours/day Type of Home: House Home Access: Stairs to enter Entrance Stairs-Rails: None Entrance Stairs-Number of Steps: 2 Home Layout: One level Home Equipment: None      Prior Function Level of Independence: Independent         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  Does own grocery shopping and household management independently     Hand Dominance        Extremity/Trunk Assessment   Upper Extremity Assessment Upper Extremity Assessment: Overall WFL for tasks assessed    Lower Extremity Assessment Lower Extremity Assessment: RLE deficits/detail (decreased sensation in bil feet) RLE Deficits / Details: R lower leg in ACE wrap with wound vac s/p debridement with orders for NWB, limiting mobility and strength RLE Sensation: decreased light touch (throughout foot) RLE Coordination: decreased gross motor    Cervical / Trunk Assessment Cervical / Trunk Assessment: Normal  Communication   Communication: No difficulties  Cognition Arousal/Alertness: Awake/alert Behavior During Therapy: WFL for tasks assessed/performed Overall Cognitive Status: No family/caregiver present to determine baseline cognitive functioning                                 General Comments: Pt appears to have difficulty comprehending medical education, repeatedly stating "oh yeah, yeah", but then unable to teach back or asking to be re-educated on occasion. Pt also stated year was "2002" and then "2020", stating he always has difficulty keeping up with it.      General Comments      Exercises     Assessment/Plan    PT Assessment Patient needs continued PT services  PT Problem List Decreased strength;Decreased range of  motion;Decreased activity tolerance;Decreased balance;Decreased mobility;Decreased coordination;Decreased cognition;Decreased knowledge of use of DME;Decreased safety awareness;Decreased knowledge of precautions;Impaired sensation;Decreased skin integrity       PT Treatment Interventions DME instruction;Gait training;Stair training;Functional mobility training;Therapeutic activities;Therapeutic exercise;Balance training;Neuromuscular re-education;Cognitive remediation;Patient/family education    PT Goals (Current goals can be found in the Care Plan section)  Acute Rehab PT Goals Patient Stated Goal: to get better PT Goal Formulation: With patient Time For Goal Achievement: 05/02/21 Potential to Achieve Goals: Good    Frequency Min 3X/week   Barriers to discharge        Co-evaluation               AM-PAC PT "6 Clicks" Mobility  Outcome Measure Help needed turning from your back to your side while in a flat bed without using bedrails?: A Little Help needed moving from lying on your back to sitting on the side of a flat bed without using bedrails?: A Little Help needed moving to and from a bed to a chair (including a wheelchair)?: A Little Help needed standing up from a chair using your arms (e.g., wheelchair or bedside chair)?: A Little Help needed to walk in hospital room?: A Little Help needed climbing 3-5 steps with a railing? : A Little 6 Click Score: 18    End of Session Equipment Utilized During Treatment: Gait belt;Other (comment) (wound vac) Activity Tolerance: Patient tolerated treatment well Patient left: in chair;with call bell/phone within reach;with  chair alarm set Nurse Communication: Mobility status;Other (comment) (wound vac alarming for blockage) PT Visit Diagnosis: Unsteadiness on feet (R26.81);Other abnormalities of gait and mobility (R26.89);Muscle weakness (generalized) (M62.81);Difficulty in walking, not elsewhere classified (R26.2)    Time:  0100-7121 PT Time Calculation (min) (ACUTE ONLY): 22 min   Charges:   PT Evaluation $PT Eval Moderate Complexity: 1 Mod          Raymond Gurney, PT, DPT Acute Rehabilitation Services  Pager: [phone] Office: [phone]   Jewel Baize 04/18/2021, 6:34 PM
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
## 2021-04-18 NOTE — Anesthesia Procedure Notes (Signed)
Procedure Name: LMA Insertion Date/Time: 04/18/2021 2:11 PM Performed by: Elliot Dally, CRNA Pre-anesthesia Checklist: Patient identified, Emergency Drugs available, Suction available and Patient being monitored Patient Re-evaluated:Patient Re-evaluated prior to induction Oxygen Delivery Method: Circle System Utilized Preoxygenation: Pre-oxygenation with 100% oxygen Induction Type: IV induction Ventilation: Mask ventilation without difficulty LMA: LMA inserted LMA Size: 5.0 Number of attempts: 1 Airway Equipment and Method: Bite block Placement Confirmation: positive ETCO2 Tube secured with: Tape Dental Injury: Teeth and Oropharynx as per pre-operative assessment

## 2021-04-18 NOTE — ED Notes (Signed)
Consent signed and at bedside  

## 2021-04-18 NOTE — Progress Notes (Signed)
2205- Pt wound vac to right foot alerted blockage. This RN also noticed some lifting to the edges of the dressing. This RN along with another RN, Trudee Grip, attempted to to reseal the vacuum and flush tubing. Attempted to reset wound vac and reinforce dressing with no success. MD paged and made aware.  2257- C. Magnus Ivan, MD returned paged. No new orders given for wound vac  0320- Pt called this RN into room regarding right foot bleeding from dressing. MD paged and made aware. New orders given to reinforce with wet to dry dressing. Dressing reinforced with  NS guaze, abd pads, kerlex and ace wraps. Will continue to monitor

## 2021-04-18 NOTE — Op Note (Signed)
04/18/2021  3:12 PM  PATIENT:  Jesse [name]    PRE-OPERATIVE DIAGNOSIS:  abscess right foot  POST-OPERATIVE DIAGNOSIS:  Same  PROCEDURE: Excisional DEBRIDEMENT OF RIGHT FOOT Application of cleanse choice wound VAC sponges x2.  SURGEON:  Nadara Mustard, MD  PHYSICIAN ASSISTANT:None ANESTHESIA:   General  PREOPERATIVE INDICATIONS:  Jesse [name] is a  60 y.o. male with a diagnosis of abscess right foot who failed conservative measures and elected for surgical management.    The risks benefits and alternatives were discussed with the patient preoperatively including but not limited to the risks of infection, bleeding, nerve injury, cardiopulmonary complications, the need for revision surgery, among others, and the patient was willing to proceed.  OPERATIVE IMPLANTS: Cleanse choice wound VAC sponges x2  @ENCIMAGES @  OPERATIVE FINDINGS: Patient had a large area of necrotic skin and soft tissue muscle and fascia.  This was sent for cultures.  Plan for return to the operating room on Friday for repeat debridement and possible skin graft.  OPERATIVE PROCEDURE: Patient brought the operating room and underwent a general anesthetic.  After adequate levels anesthesia obtained patient's right lower extremity was prepped using DuraPrep draped into a sterile field a timeout was called.  A elliptical incision was made around the ulcerative tissue this is started from the tarsal tunnel region and extended distally to the midfoot.  This left a wound that was 13 x 9 cm.  Skin and soft tissue muscle and fascia was excised back to healthy viable tissue.  The tissue was sent for cultures.  The wound was irrigated with normal saline electrocardio was used hemostasis.  To cleanse choice sponges were placed within the wound this had a good suction fit a compression wrap from the foot to the tibial tubercle was applied.  Patient was extubated taken the PACU in stable condition.  Debridement type:  Excisional Debridement  Side: right  Body Location: foot   Tools used for debridement: scalpel and rongeur  Pre-debridement Wound size (cm):   Length: 3        Width: 3     Depth: 1   Post-debridement Wound size (cm):   Length: 13        Width: 9     Depth: 4   Debridement depth beyond dead/damaged tissue down to healthy viable tissue: yes  Tissue layer involved: skin, subcutaneous tissue, muscle / fascia  Nature of tissue removed: Slough, Necrotic, Devitalized Tissue, Non-viable tissue and Purulence  Irrigation volume: 1 liter     Irrigation fluid type: Normal Saline        DISCHARGE PLANNING:  Antibiotic duration: Continue IV antibiotics.  Weightbearing: Nonweightbearing on the right.  Pain medication: Opioid pathway.  Dressing care/ Wound VAC: Continue wound VAC.  Ambulatory devices: Walker or crutches.  Discharge to: Plan for return to the operating room on Friday for possible skin graft and further debridement.  Follow-up: In the office 1 week post operative.

## 2021-04-18 NOTE — Transfer of Care (Signed)
Immediate Anesthesia Transfer of Care Note  Patient: AMERICO VALLERY  Procedure(s) Performed: IRRIGATION AND DEBRIDEMENT OF FOOT (Right )  Patient Location: PACU  Anesthesia Type:General  Level of Consciousness: drowsy  Airway & Oxygen Therapy: Patient Spontanous Breathing and Patient connected to nasal cannula oxygen  Post-op Assessment: Report given to RN and Post -op Vital signs reviewed and stable  Post vital signs: Reviewed and stable  Last Vitals:  Vitals Value Taken Time  BP 119/76 04/18/21 1448  Temp    Pulse 70 04/18/21 1450  Resp    SpO2 100 % 04/18/21 1450  Vitals shown include unvalidated device data.  Last Pain:  Vitals:   04/18/21 1313  TempSrc:   PainSc: 0-No pain         Complications: No complications documented.

## 2021-04-18 NOTE — Anesthesia Preprocedure Evaluation (Signed)
Anesthesia Evaluation  Patient identified by MRN, date of birth, ID band Patient awake    Reviewed: Allergy & Precautions, NPO status , Patient's Chart, lab work & pertinent test results  Airway Mallampati: II  TM Distance: >3 FB Neck ROM: Full    Dental  (+) Chipped,    Pulmonary neg pulmonary ROS,    Pulmonary exam normal        Cardiovascular hypertension, Pt. on medications  Rhythm:Regular Rate:Normal     Neuro/Psych PSYCHIATRIC DISORDERS negative neurological ROS     GI/Hepatic negative GI ROS, Neg liver ROS,   Endo/Other  diabetes, Type 2, Insulin Dependent  Renal/GU Renal InsufficiencyRenal disease     Musculoskeletal negative musculoskeletal ROS (+)   Abdominal Normal abdominal exam  (+)   Peds  Hematology negative hematology ROS (+)   Anesthesia Other Findings   Reproductive/Obstetrics                             Anesthesia Physical Anesthesia Plan  ASA: III  Anesthesia Plan: General   Post-op Pain Management:    Induction: Intravenous  PONV Risk Score and Plan: 3 and Ondansetron, Dexamethasone and Midazolam  Airway Management Planned: LMA  Additional Equipment: None  Intra-op Plan:   Post-operative Plan: Extubation in OR  Informed Consent: I have reviewed the patients History and Physical, chart, labs and discussed the procedure including the risks, benefits and alternatives for the proposed anesthesia with the patient or authorized representative who has indicated his/her understanding and acceptance.       Plan Discussed with: CRNA  Anesthesia Plan Comments: (EKG: normal sinus rhythm.  Echo: 1. Left ventricular ejection fraction, by estimation, is 60 to 65%. The  left ventricle has normal function. The left ventricle has no regional  wall motion abnormalities. Left ventricular diastolic parameters were  normal.  2. Right ventricular systolic function  is normal. The right ventricular  size is normal. Tricuspid regurgitation signal is inadequate for assessing  PA pressure.  3. The mitral valve is normal in structure. Trivial mitral valve  regurgitation. No evidence of mitral stenosis.  4. The aortic valve has an indeterminant number of cusps. There is  moderate calcification of the aortic valve. Aortic valve regurgitation is  not visualized. Mild to moderate aortic valve sclerosis/calcification is  present, without any evidence of aortic  stenosis.  5. The inferior vena cava is dilated in size with <50% respiratory  variability, suggesting right atrial pressure of 15 mmHg. )        Anesthesia Quick Evaluation

## 2021-04-18 NOTE — Anesthesia Postprocedure Evaluation (Signed)
Anesthesia Post Note  Patient: Jesse [name]  Procedure(s) Performed: IRRIGATION AND DEBRIDEMENT OF FOOT (Right )     Patient location during evaluation: PACU Anesthesia Type: General Level of consciousness: awake and alert Pain management: pain level controlled Vital Signs Assessment: post-procedure vital signs reviewed and stable Respiratory status: spontaneous breathing, nonlabored ventilation, respiratory function stable and patient connected to nasal cannula oxygen Cardiovascular status: blood pressure returned to baseline and stable Postop Assessment: no apparent nausea or vomiting Anesthetic complications: no   No complications documented.  Last Vitals:  Vitals:   04/18/21 1534 04/18/21 1616  BP: 132/88 134/80  Pulse:  (!) 58  Resp: (!) 24 16  Temp: (!) 36.2 C   SpO2: 96% 97%    Last Pain:  Vitals:   04/18/21 1534  TempSrc:   PainSc: Asleep                 [name]

## 2021-04-18 NOTE — Progress Notes (Signed)
PROGRESS NOTE  Jesse [name]  YNW:[id number] DOB: Mar 21, 1961 DOA: 04/17/2021 PCP: Oneita Hurt, No   Brief Narrative: Jesse [name] is a 60 y.o. male with medical history significant of hypertension, hyperlipidemia, diabetes mellitus type 2, and alcohol abuse who presented with complaints of worsening right foot infection.  Records note patient was hospitalized 3/30 diagnosed with diabetes mellitus type 2 and right foot cellulitis treated with IV antibiotics and discharged home on Lantus.  Approximately 2 weeks ago patient reported that he had stepped on a nail.  Since that time patient reports that he has had swelling of his right foot, pain for which it makes it difficult for him to stand, and has been draining foul-smelling pus after he tried to open it on his own.  Patient was admitted to the hospitalist service on 5/29, but left AGAINST MEDICAL ADVICE.  Patient returned to the emergency department and was admitted 6/4-6/5, before leaving again AGAINST MEDICAL ADVICE.  Reported that he had an emergency at home that he had to leave. During that hospitalization he was able to have MRI of the right foot that showed no findings of osteomyelitis or drainable abscess and a plantar ulceration with swelling of the tissues with scattered gas. Since leaving hospital he reports that the foot was just not getting better.  Denies having any significant fever, chest pain, nausea, vomiting, or diarrhea.  In talking to the patient patient makes it known that he has had difficulty understanding how to check his blood sugars and to administer insulin.  He had not taking any medications that were prescribed previously as he reports that he did not understand what they were for and then lost them.  He reports previously missing his follow-up appointments with primary care providers as well.  Patient reports that he normally drinks a quart of beer daily, but then states that he drinks a 40 ounce of beer per day on average.  His last  drink was yesterday evening.  ED Course: Upon admission into the emergency department patient was seen to be afebrile with blood pressure 112/69 136/108, and all other vital signs maintained.  Labs significant for WBC 10.7, hemoglobin 12.1, BUN 30, creatinine 1.54, and glucose 230.  Patient was started on vancomycin and cefepime.  Orthopedics was consulted and plan to take patient for I&D tomorrow morning.  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 Lajoyce Corners, orthopedics, consulted and planning I&D 6/8. Would appreciate intraoperative cultures if able.  - Blood cultures from 6/4 NGTD.  - Continue vancomycin, cefepime. Would consider addition of flagyl for anaerobic coverage if he becomes septic or pending culture data.   Uncontrolled T2DM with peripheral neuropathy: HbA1c 10.9% on 04/14/21, poor adherence to medication regimen.  - SSI, lantus 10u daily - Carb modified diet once cleared by orthopedics - Diabetes coordinator consulted  HTN:  - Continue losartan  HLD:  - Continue statin  AKI: Prerenal  - Continue IVF perioperatively  Alcohol abuse: Excessive daily drinking reported.  - CIWA - Moderation counseling to be provided prior to discharge.   Right thumb swelling:  - Check XR after surgery.   Obesity: Estimated body mass index is 31.87 kg/m as calculated from the following:   Height as of this encounter: 6' (  1.829 m).   Weight as of this encounter: 106.6 kg.  DVT prophylaxis: Lovenox Code Status: Full Family Communication: None at bedside Disposition Plan:  Status is: Inpatient  Remains inpatient appropriate because:Inpatient level of care appropriate due to severity of illness  Dispo: The patient is from:  Home              Anticipated d/c is to: Home              Patient currently is not medically stable to d/c.   Difficult to place patient No  Consultants:   Orthopedics, Dr. Lajoyce Corners  Procedures:   04/18/2021 Dr. Lajoyce Corners: Excisional DEBRIDEMENT OF RIGHT FOOT Application of cleanse choice wound VAC sponges x2.  Antimicrobials:  Vancomycin, cefepime 6/7 >>   Subjective: Mild pain in right foot is confirmed but not volunteered by patient. Says this wound has been going on for about 2 weeks. He's not taking any prn's. Denies any chest pain, dyspnea, leg swelling, palpitations, orthopnea. Does say his right thumb is swelling for the past week but only mildly painful and improving. Thinks he hit is on something at work [name]). No known history of gout and it was never red or exquisitely tender.   Objective: Vitals:   04/18/21 0345 04/18/21 0415 04/18/21 0430 04/18/21 0808  BP:   (!) 152/90 131/76  Pulse: (!) 34 72 64 70  Resp:   18 19  Temp:    97.8 F (36.6 C)  TempSrc:    Oral  SpO2: (!) 85% (!) 77% 98% 95%  Weight:      Height:        Intake/Output Summary (Last 24 hours) at 04/18/2021 0953 Last data filed at 04/18/2021 0232 Gross per 24 hour  Intake 398.3 ml  Output --  Net 398.3 ml   Filed Weights   04/17/21 1526  Weight: 106.6 kg    Gen: 60 y.o. male in no distress standing in room Pulm: Non-labored breathing room air. Clear to auscultation bilaterally.  CV: Regular rate and rhythm. No murmur, rub, or gallop. No JVD, no pitting pedal edema. GI: Abdomen soft, non-tender, non-distended, with normoactive bowel sounds. No organomegaly or masses felt. Ext: Warm, dry. Right thumb PIP diffusely enlarged without significant tenderness, no warmth or erythema. Limited ROM at that joint only.  Skin: Right plantar foot wound and plantar toe ulcers as depicted in H&P with surrounding cellulitis.  Neuro: Alert and oriented. Diminished peripheral sensation grossly. No focal neurological  deficits. Psych: Judgement and insight appear marginal. Mood & affect appropriate.   Data Reviewed: I have personally reviewed following labs and imaging studies  CBC: Recent Labs  Lab 04/14/21 1306 04/14/21 1607 04/17/21 0959  WBC 12.1* 11.3* 10.7*  NEUTROABS 8.6*  --  6.8  HGB 13.0 13.2 12.1*  HCT 39.3 41.0 37.4*  MCV 95.4 98.8 97.7  PLT 371 349 365   Basic Metabolic Panel: Recent Labs  Lab 04/14/21 1306 04/14/21 1607 04/17/21 0959  NA 132*  --  135  K 5.0  --  4.6  CL 100  --  105  CO2 22  --  22  GLUCOSE 259*  --  230*  BUN 30*  --  30*  CREATININE 1.68*  --  1.54*  CALCIUM 9.5  --  9.1  MG  --  1.9  --   PHOS  --  4.3  --    GFR: Estimated Creatinine Clearance: 66 mL/min (A) (by C-G formula based on SCr of  1.54 mg/dL (H)). Liver Function Tests: Recent Labs  Lab 04/14/21 1306  AST 21  ALT 31  ALKPHOS 108  BILITOT 0.6  PROT 8.3*  ALBUMIN 2.9*   No results for input(s): LIPASE, AMYLASE in the last 168 hours. No results for input(s): AMMONIA in the last 168 hours. Coagulation Profile: No results for input(s): INR, PROTIME in the last 168 hours. Cardiac Enzymes: No results for input(s): CKTOTAL, CKMB, CKMBINDEX, TROPONINI in the last 168 hours. BNP (last 3 results) No results for input(s): PROBNP in the last 8760 hours. HbA1C: No results for input(s): HGBA1C in the last 72 hours. CBG: Recent Labs  Lab 04/14/21 1650 04/14/21 2230 04/17/21 2019 04/18/21 0726  GLUCAP 212* 158* 191* 187*   Lipid Profile: No results for input(s): CHOL, HDL, LDLCALC, TRIG, CHOLHDL, LDLDIRECT in the last 72 hours. Thyroid Function Tests: No results for input(s): TSH, T4TOTAL, FREET4, T3FREE, THYROIDAB in the last 72 hours. Anemia Panel: No results for input(s): VITAMINB12, FOLATE, FERRITIN, TIBC, IRON, RETICCTPCT in the last 72 hours. Urine analysis:    Component Value Date/Time   COLORURINE YELLOW 02/07/2021 1521   APPEARANCEUR CLEAR 02/07/2021 1521   LABSPEC  1.014 02/07/2021 1521   PHURINE 5.0 02/07/2021 1521   GLUCOSEU 150 (A) 02/07/2021 1521   HGBUR SMALL (A) 02/07/2021 1521   BILIRUBINUR NEGATIVE 02/07/2021 1521   KETONESUR NEGATIVE 02/07/2021 1521   PROTEINUR NEGATIVE 02/07/2021 1521   NITRITE NEGATIVE 02/07/2021 1521   LEUKOCYTESUR NEGATIVE 02/07/2021 1521   Recent Results (from the past 240 hour(s))  Blood culture (routine x 2)     Status: None   Collection Time: 04/08/21  8:22 PM   Specimen: BLOOD  Result Value Ref Range Status   Specimen Description   Final    BLOOD RIGHT ANTECUBITAL Performed at [facility], [address], [place], [place] [address]    Special Requests   Final    BOTTLES DRAWN AEROBIC AND ANAEROBIC Blood Culture adequate volume Performed at [facility], [address] [place], [place] [address]    Culture   Final    NO GROWTH 5 DAYS Performed at [facility], [address], [place], [place] [address]    Report Status 04/14/2021 FINAL  Final  SARS CORONAVIRUS 2 (TAT 6-24 HRS) Nasopharyngeal Nasopharyngeal Swab     Status: None   Collection Time: 04/08/21  8:24 PM   Specimen: Nasopharyngeal Swab  Result Value Ref Range Status   SARS Coronavirus 2 NEGATIVE NEGATIVE Final    Comment: (NOTE) SARS-CoV-2 target nucleic acids are NOT DETECTED.  The SARS-CoV-2 RNA is generally detectable in upper and lower respiratory specimens during the acute phase of infection. Negative results do not preclude SARS-CoV-2 infection, do not rule out co-infections with other pathogens, and should not be used as the sole basis for treatment or other patient management decisions. Negative results must be combined with clinical observations, patient history, and epidemiological information. The expected result is Negative.  Fact Sheet for Patients: HairSlick.no  Fact Sheet for Healthcare Providers: quierodirigir.com  This  test is not yet approved or cleared by the [place] FDA and  has been authorized for detection and/or diagnosis of SARS-CoV-2 by FDA under an Emergency Use Authorization (EUA). This EUA will remain  in effect (meaning this test can be used) for the duration of the COVID-19 declaration under Se ction 564(b)(1) of the Act, 21 U.S.C. section 360bbb-3(b)(1), unless the authorization is terminated or revoked sooner.  Performed  at [facility], [address], [place], [place] [address]   Blood culture (routine x 2)     Status: None   Collection Time: 04/08/21  8:27 PM   Specimen: BLOOD  Result Value Ref Range Status   Specimen Description   Final    BLOOD LEFT ANTECUBITAL Performed at [facility], [address], [place], [place] [address]    Special Requests   Final    BOTTLES DRAWN AEROBIC AND ANAEROBIC Blood Culture adequate volume Performed at [facility], [address], [place], [place] [address]    Culture   Final    NO GROWTH 5 DAYS Performed at [facility], [address] [place], [place] [address]    Report Status 04/14/2021 FINAL  Final  Culture, blood (routine x 2)     Status: None (Preliminary result)   Collection Time: 04/14/21  4:30 PM   Specimen: Right Antecubital; Blood  Result Value Ref Range Status   Specimen Description RIGHT ANTECUBITAL  Final   Special Requests   Final    BOTTLES DRAWN AEROBIC AND ANAEROBIC Blood Culture adequate volume   Culture   Final    NO GROWTH 4 DAYS Performed at [facility], [address], [place], [place] [address]    Report Status PENDING  Incomplete  Resp Panel by RT-PCR (Flu A&B, Covid) Nasopharyngeal Swab     Status: None   Collection Time: 04/14/21  4:30 PM   Specimen: Nasopharyngeal Swab; Nasopharyngeal(NP) swabs in vial transport medium  Result Value Ref Range Status   SARS Coronavirus 2 by RT PCR NEGATIVE NEGATIVE Final    Comment: (NOTE) SARS-CoV-2  target nucleic acids are NOT DETECTED.  The SARS-CoV-2 RNA is generally detectable in upper respiratory specimens during the acute phase of infection. The lowest concentration of SARS-CoV-2 viral copies this assay can detect is 138 copies/mL. A negative result does not preclude SARS-Cov-2 infection and should not be used as the sole basis for treatment or other patient management decisions. A negative result may occur with  improper specimen collection/handling, submission of specimen other than nasopharyngeal swab, presence of viral mutation(s) within the areas targeted by this assay, and inadequate number of viral copies(<138 copies/mL). A negative result must be combined with clinical observations, patient history, and epidemiological information. The expected result is Negative.  Fact Sheet for Patients:  BloggerCourse.com  Fact Sheet for Healthcare Providers:  SeriousBroker.it  This test is no t yet approved or cleared by the [place] FDA and  has been authorized for detection and/or diagnosis of SARS-CoV-2 by FDA under an Emergency Use Authorization (EUA). This EUA will remain  in effect (meaning this test can be used) for the duration of the COVID-19 declaration under Section 564(b)(1) of the Act, 21 U.S.C.section 360bbb-3(b)(1), unless the authorization is terminated  or revoked sooner.       Influenza A by PCR NEGATIVE NEGATIVE Final   Influenza B by PCR NEGATIVE NEGATIVE Final    Comment: (NOTE) The Xpert Xpress SARS-CoV-2/FLU/RSV plus assay is intended as an aid in the diagnosis of influenza from Nasopharyngeal swab specimens and should not be used as a sole basis for treatment. Nasal washings and aspirates are unacceptable for Xpert Xpress SARS-CoV-2/FLU/RSV testing.  Fact Sheet for Patients: BloggerCourse.com  Fact Sheet for Healthcare  Providers: SeriousBroker.it  This test is not yet approved or cleared by the [place] FDA and has been authorized for detection and/or diagnosis of  SARS-CoV-2 by FDA under an Emergency Use Authorization (EUA). This EUA will remain in effect (meaning this test can be used) for the duration of the COVID-19 declaration under Section 564(b)(1) of the Act, 21 U.S.C. section 360bbb-3(b)(1), unless the authorization is terminated or revoked.  Performed at [facility], [address] [place], [place] [address]   Resp Panel by RT-PCR (Flu A&B, Covid) Nasopharyngeal Swab     Status: None   Collection Time: 04/17/21  9:40 AM   Specimen: Nasopharyngeal Swab; Nasopharyngeal(NP) swabs in vial transport medium  Result Value Ref Range Status   SARS Coronavirus 2 by RT PCR NEGATIVE NEGATIVE Final    Comment: (NOTE) SARS-CoV-2 target nucleic acids are NOT DETECTED.  The SARS-CoV-2 RNA is generally detectable in upper respiratory specimens during the acute phase of infection. The lowest concentration of SARS-CoV-2 viral copies this assay can detect is 138 copies/mL. A negative result does not preclude SARS-Cov-2 infection and should not be used as the sole basis for treatment or other patient management decisions. A negative result may occur with  improper specimen collection/handling, submission of specimen other than nasopharyngeal swab, presence of viral mutation(s) within the areas targeted by this assay, and inadequate number of viral copies(<138 copies/mL). A negative result must be combined with clinical observations, patient history, and epidemiological information. The expected result is Negative.  Fact Sheet for Patients:  BloggerCourse.comhttps://www.fda.gov/media/152166/download  Fact Sheet for Healthcare Providers:  SeriousBroker.ithttps://www.fda.gov/media/152162/download  This test is no t yet approved or cleared by the [place] FDA and  has been authorized for  detection and/or diagnosis of SARS-CoV-2 by FDA under an Emergency Use Authorization (EUA). This EUA will remain  in effect (meaning this test can be used) for the duration of the COVID-19 declaration under Section 564(b)(1) of the Act, 21 U.S.C.section 360bbb-3(b)(1), unless the authorization is terminated  or revoked sooner.       Influenza A by PCR NEGATIVE NEGATIVE Final   Influenza B by PCR NEGATIVE NEGATIVE Final    Comment: (NOTE) The Xpert Xpress SARS-CoV-2/FLU/RSV plus assay is intended as an aid in the diagnosis of influenza from Nasopharyngeal swab specimens and should not be used as a sole basis for treatment. Nasal washings and aspirates are unacceptable for Xpert Xpress SARS-CoV-2/FLU/RSV testing.  Fact Sheet for Patients: BloggerCourse.comhttps://www.fda.gov/media/152166/download  Fact Sheet for Healthcare Providers: SeriousBroker.ithttps://www.fda.gov/media/152162/download  This test is not yet approved or cleared by the [place] FDA and has been authorized for detection and/or diagnosis of SARS-CoV-2 by FDA under an Emergency Use Authorization (EUA). This EUA will remain in effect (meaning this test can be used) for the duration of the COVID-19 declaration under Section 564(b)(1) of the Act, 21 U.S.C. section 360bbb-3(b)(1), unless the authorization is terminated or revoked.  Performed at [facility], [address] [place], [place] [address]       Radiology Studies: No results found.  Scheduled Meds: . atorvastatin  40 mg Oral Daily  . chlorhexidine  60 mL Topical Once  . enoxaparin (LOVENOX) injection  40 mg Subcutaneous Q24H  . folic acid  1 mg Oral Daily  . insulin aspart  0-9 Units Subcutaneous TID WC  . insulin glargine  10 Units Subcutaneous Daily  . losartan  25 mg Oral Daily  . multivitamin with minerals  1 tablet Oral Daily  . povidone-iodine  2 application Topical Once  . sodium chloride flush  3 mL Intravenous Q12H  . thiamine  100 mg Oral Daily   Or  .  thiamine  100 mg Intravenous Daily   Continuous Infusions: . sodium chloride 75 mL/hr at 04/18/21 0302  .  ceFAZolin (ANCEF) IV    . ceFEPime (MAXIPIME) IV Stopped (04/18/21 0232)  . vancomycin       LOS: 1 day   Time spent: 35 minutes.  Tyrone Nine, MD [facility] [email or web address] 04/18/2021, 9:53 AM

## 2021-04-19 ENCOUNTER — Other Ambulatory Visit: Payer: Self-pay | Admitting: Physician Assistant

## 2021-04-19 ENCOUNTER — Inpatient Hospital Stay (HOSPITAL_COMMUNITY): Payer: Self-pay

## 2021-04-19 ENCOUNTER — Encounter (HOSPITAL_COMMUNITY): Payer: Self-pay | Admitting: Orthopedic Surgery

## 2021-04-19 LAB — CULTURE, BLOOD (ROUTINE X 2)
Culture: NO GROWTH
Special Requests: ADEQUATE

## 2021-04-19 LAB — GLUCOSE, CAPILLARY
Glucose-Capillary: 170 mg/dL — ABNORMAL HIGH (ref 70–99)
Glucose-Capillary: 184 mg/dL — ABNORMAL HIGH (ref 70–99)
Glucose-Capillary: 210 mg/dL — ABNORMAL HIGH (ref 70–99)
Glucose-Capillary: 223 mg/dL — ABNORMAL HIGH (ref 70–99)

## 2021-04-19 LAB — BASIC METABOLIC PANEL
Anion gap: 6 (ref 5–15)
BUN: 22 mg/dL — ABNORMAL HIGH (ref 6–20)
CO2: 23 mmol/L (ref 22–32)
Calcium: 8.9 mg/dL (ref 8.9–10.3)
Chloride: 106 mmol/L (ref 98–111)
Creatinine, Ser: 1.3 mg/dL — ABNORMAL HIGH (ref 0.61–1.24)
GFR, Estimated: >60 mL/min (ref >60)
Glucose, Bld: 175 mg/dL — ABNORMAL HIGH (ref 70–99)
Potassium: 5.2 mmol/L — ABNORMAL HIGH (ref 3.5–5.1)
Sodium: 135 mmol/L (ref 135–145)

## 2021-04-19 LAB — CBC
HCT: 34 % — ABNORMAL LOW (ref 39.0–52.0)
Hemoglobin: 11.3 g/dL — ABNORMAL LOW (ref 13.0–17.0)
MCH: 31.7 pg (ref 26.0–34.0)
MCHC: 33.2 g/dL (ref 30.0–36.0)
MCV: 95.2 fL (ref 80.0–100.0)
Platelets: 309 10*3/uL (ref 150–400)
RBC: 3.57 MIL/uL — ABNORMAL LOW (ref 4.22–5.81)
RDW: 12.6 % (ref 11.5–15.5)
WBC: 14.8 10*3/uL — ABNORMAL HIGH (ref 4.0–10.5)
nRBC: 0 % (ref 0.0–0.2)

## 2021-04-19 IMAGING — DX DG FINGER THUMB 2+V*R*
3 series · 3 of 3 positions shown · non-contrast
Comparison: None.

CLINICAL DATA: Right thumb swelling.

EXAM:
RIGHT THUMB 2+V

[finger obl]
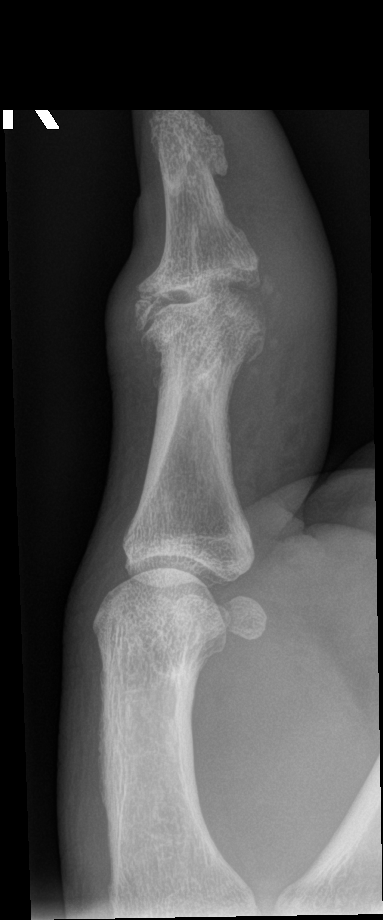

[finger lat]
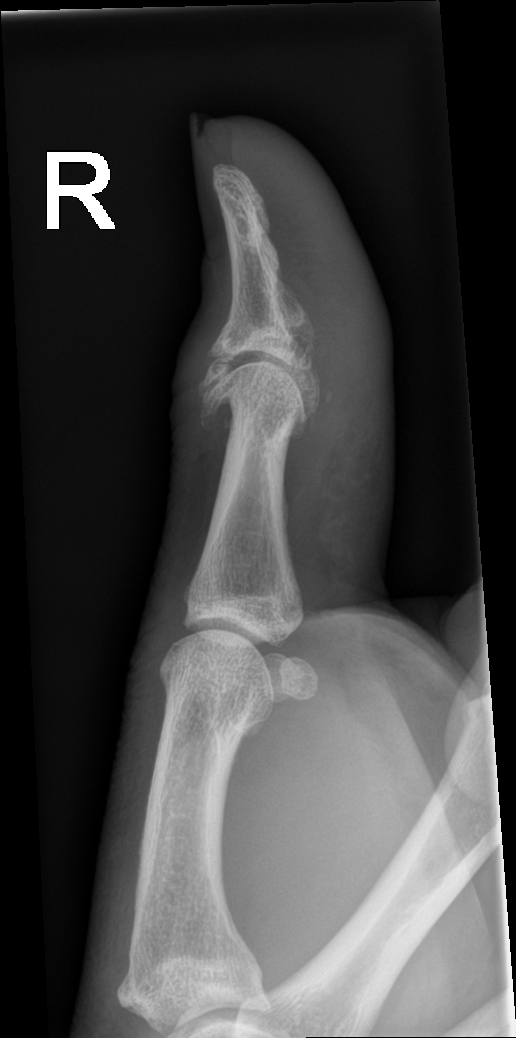

[finger ap]
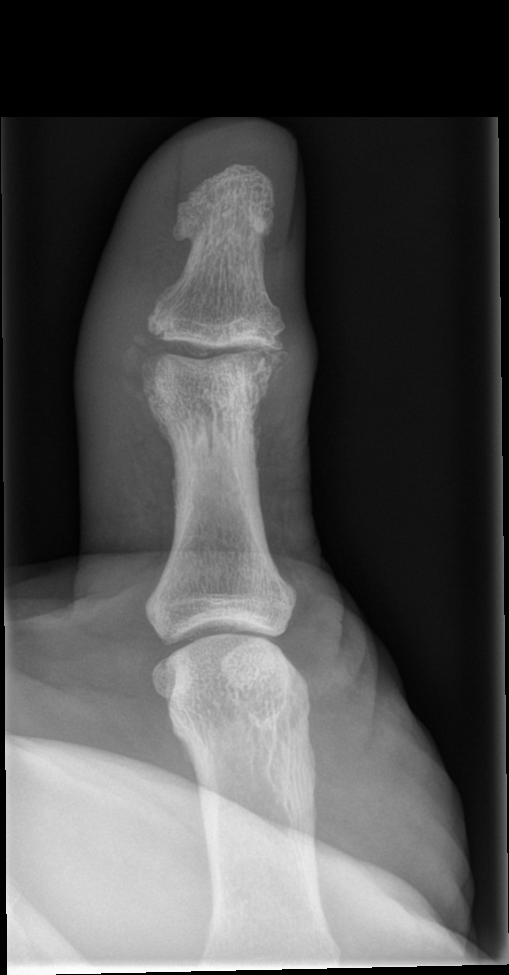

[3 of 3 positions shown; findings below may reference images not displayed]

FINDINGS: There is no evidence of fracture or dislocation. Moderate osteophyte
formation is seen involving the first interphalangeal joint
suggesting osteoarthritis. Soft tissue swelling of the thumb is
noted suggesting possible cellulitis.
IMPRESSION: Diffuse soft tissue swelling is noted concerning for possible
cellulitis. Probable osteoarthritis of the first interphalangeal
joint. No fracture or dislocation is noted.

## 2021-04-19 MED ORDER — SODIUM ZIRCONIUM CYCLOSILICATE 10 G PO PACK
10.0000 g | PACK | Freq: Once | ORAL | Status: AC
  Administered 2021-04-19: 10 g via ORAL
  Filled 2021-04-19: qty 1

## 2021-04-19 MED ORDER — INSULIN STARTER KIT- PEN NEEDLES (ENGLISH)
1.0000 | Freq: Once | Status: AC
  Administered 2021-04-19: 1
  Filled 2021-04-19: qty 1

## 2021-04-19 NOTE — TOC Initial Note (Addendum)
Transition of Care [facility]) - Initial/Assessment Note    Patient Details  Name: Jesse [name] MRN: [id number] Date of Birth: 08-Oct-1961  Transition of Care [facility] CM/SW Contact:    Kingsley Plan, RN Phone Number: 04/19/2021, 2:29 PM  Clinical Narrative:                  Spoke to patient at bedside. Confirmed face sheet information, updated new phone number.   Patient from home alone.   Secure chatted [facility] Persons, patient will discharge with Preveena VAC.   Patient states he had two blood sugar machines and supplies but someone at his work threw them away.   Discussed with Alinda Money in [facility] , he has entered orders for blood sugar machine and supplies, they are pended and will be released at discharge.   NCM will assist with medications at discharge with [facility].   Discussed PCP. Patient stated he went to [facility] a month and a half ago and saw a doctor. NCM called [facility], patient has no record of being there, he has had appointments at [facility] but was a no show. NCM scheduled first available appointment at  [facility] May 22, 2021 at 2:10 pm.  If patient is a no show , they will not schedule another appointment. Information placed on AVS. Discussed with patient and he voiced understanding.   Ordered 3 in1, wheelchair, and tub bench with [employer]. Asked bedside nurse to order crutches   Expected Discharge Plan: Home/Self Care     Patient Goals and CMS Choice   CMS Medicare.gov Compare Post Acute Care list provided to:: Patient    Expected Discharge Plan and Services Expected Discharge Plan: Home/Self Care   Discharge Planning Services: CM Consult   Living arrangements for the past 2 months: Apartment                                      Prior Living Arrangements/Services Living arrangements for the past 2 months: Apartment Lives with:: Self Patient language and need for interpreter reviewed:: Yes Do you  feel safe going back to the place where you live?: Yes            Criminal Activity/Legal Involvement Pertinent to Current Situation/Hospitalization: No - Comment as needed  Activities of Daily Living Home Assistive Devices/Equipment: None ADL Screening (condition at time of admission) Patient's cognitive ability adequate to safely complete daily activities?: Yes Is the patient deaf or have difficulty hearing?: No Does the patient have difficulty seeing, even when wearing glasses/contacts?: No Does the patient have difficulty concentrating, remembering, or making decisions?: No Patient able to express need for assistance with ADLs?: Yes Does the patient have difficulty dressing or bathing?: No Independently performs ADLs?: Yes (appropriate for developmental age) Does the patient have difficulty walking or climbing stairs?: No Weakness of Legs: None Weakness of Arms/Hands: None  Permission Sought/Granted   Permission granted to share information with : No              Emotional Assessment Appearance:: Appears stated age Attitude/Demeanor/Rapport: Engaged Affect (typically observed): Accepting Orientation: : Oriented to Self, Oriented to Place, Oriented to  Time, Oriented to Situation Alcohol / Substance Use: Not Applicable Psych Involvement: No (comment)  Admission diagnosis:  Diabetic foot ulcer (HCC) [T51.761, L97.509] Foot infection [L08.9] Patient Active Problem List   Diagnosis Date Noted   Cellulitis  of right foot 04/17/2021   Osteomyelitis (HCC) 04/14/2021   Right foot infection    Diabetic foot infection (HCC) 04/08/2021   HTN (hypertension) 04/08/2021   HLD (hyperlipidemia) 04/08/2021   Alcohol abuse    Cellulitis    Bilateral leg edema 02/07/2021   Anasarca    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 2 diabetes mellitus, limited to breakdown of skin (HCC)    Hypokalemia 01/28/2014   DM (diabetes mellitus), type 2, uncontrolled (HCC)  01/27/2014   AKI (acute kidney injury) (HCC) 01/27/2014   Leukocytosis 01/27/2014   Facial cellulitis 01/26/2014   Dental abscess 01/26/2014   Infected dental carries 01/26/2014   Cellulitis and abscess 01/26/2014   PCP:  Pcp, No Pharmacy:   [facility] [phone] - [place], [place] - [address] [facility] [address] [place] [address] Phone: [phone] Fax: [phone]  [name] [address] [place] [address] Phone: [phone] Fax: [phone]     Social Determinants of Health (SDOH) Interventions    Readmission Risk Interventions No flowsheet data found.

## 2021-04-19 NOTE — Consult Note (Signed)
WOC Nurse Consult Note: Patient receiving care in [facility] [phone] Per Dr. Lajoyce Corners request, NPWT replaced. This pt will go back to surgery tomorrow for additional debridement. Has insensate foot and denied the need for pain meds. Reason for Consult: Vac replacment Wound type: Surgical Pressure Injury POA: NA Wound bed: Bloody Drainage (amount, consistency, odor) 300cc of blood in canister that was removed.  Dressing procedure/placement/frequency: Previous surgical sponge left in place per Dr. Lajoyce Corners request. One piece small black foam placed over the wound. Drape applied. Bridged to the lower leg. Suction obtained immediately at 125 mmHg. Wrapped with ace wrap for protection only.   Monitor the wound area(s) for worsening of condition such as: Signs/symptoms of infection, increase in size, development of or worsening of odor, development of pain, or increased pain at the affected locations.   Notify the medical team if any of these develop.  Thank you for the consult. WOC nurse will not follow at this time.   Please re-consult the WOC team if needed.  Renaldo Reel Katrinka Blazing, MSN, RN, CMSRN, Angus Seller, [facility] Wound Treatment Associate Pager [phone]

## 2021-04-19 NOTE — Progress Notes (Signed)
Patient is postop day 1 status post irrigation and debridement of right wound infection.  Pain is well controlled wants to know if he can have the cast placed on his leg so he can bear weight  Vital signs stable.  Wound VAC has been removed and a dry dressing is been placed.  Patient said this was done because he was bleeding from beneath the [facility] dressing.  Cannot find any notes to verify this.  Large bulky dressing is currently in place without drainage  Explained to the patient why cast would be inappropriate.  We will have PT work on mobility with crutches and/or a wheelchair.  Plan go back to the OR tomorrow for application of a skin graft patient understands and agrees

## 2021-04-19 NOTE — Progress Notes (Signed)
PROGRESS NOTE  Jesse [name]  ZOX:[id number] DOB: 15-Feb-1961 DOA: 04/17/2021 PCP: Oneita Hurt, No   Brief Narrative: Jesse [name] is a 60 y.o. male with a longstanding history of poorly controlled diabetes, HTN, HLD, peripheral insensate diabetic neuropathy, and alcohol use who presented to the ED 6/7 with continued/worsening right foot wound despite taking antibiotics. He was admitted twice before (on 5/29 and again 6/4 - 6/5) for this problem but left AMA prior to definitive treatment. He was afebrile, WBC 10.7k, hemoglobin 12.1, BUN 30, creatinine 1.54, and glucose 230. Vancomycin, cefepime started, and orthopedics consulted, taking for I&D on 6/8, planning return to OR 6/10.   Assessment & Plan: Principal Problem:   Diabetic foot infection (HCC) Active Problems:   DM (diabetes mellitus), type 2, uncontrolled (HCC)   Leukocytosis   HTN (hypertension)   HLD (hyperlipidemia)   Cellulitis of right foot  Infected diabetic right foot ulcer with cellulitis, suspected abscess: - s/p I&D 6/8, planning return 6/10.   - Blood cultures from 6/4 NGTD. Not redrawn this admission. - Continue vancomycin, cefepime. Would consider addition of flagyl for anaerobic coverage if he becomes septic or pending culture data (though negative gram stain from surgical culture thus far).   Uncontrolled T2DM with peripheral neuropathy: HbA1c 10.9% on 04/14/21, poor adherence to medication regimen.  - SSI, lantus 10u daily - Carb modified diet once cleared by orthopedics - Diabetes coordinator consulted - Work up for alternative causes of neuropathy first with folate, thiamine, B12. Also with normocytic anemia, so check for concomitant iron deficiency as well.   HTN:  - Continue losartan  HLD:  - Continue statin  AKI: Prerenal  - Improved. Can stop IVF now that he's taking good po.   Hyperkalemia: Mild.  Thompson Caul - Continue monitoring   Alcohol abuse: Excessive daily drinking reported.  - CIWA, if remains  zero, will DC checks tmrw AM - Moderation counseling to be provided prior to discharge.   Right thumb swelling: Suspected to be traumatic with neuropathy. No current or previous history of erythema/swelling suggestive of gout or septic joint.  - Check XR.  Obesity: Estimated body mass index is 31.87 kg/m as calculated from the following:   Height as of this encounter: 6' (1.829 m).   Weight as of this encounter: 106.6 kg.  DVT prophylaxis: Lovenox Code Status: Full Family Communication: None at bedside Disposition Plan:  Status is: Inpatient  Remains inpatient appropriate because:Inpatient level of care appropriate due to severity of illness  Dispo: The patient is from: Home              Anticipated d/c is to: Home              Patient currently is not medically stable to d/c.   Difficult to place patient No  Consultants:  Orthopedics, Dr. Lajoyce Corners  Procedures:  04/18/2021 Dr. Lajoyce Corners: Excisional DEBRIDEMENT OF RIGHT FOOT. Application of cleanse choice wound VAC sponges x2.  Antimicrobials: Vancomycin, cefepime 6/7 >>   Subjective: Mild pain in right foot is confirmed but not volunteered by patient. Says this wound has been going on for about 2 weeks. He's not taking any prn's. Denies any chest pain, dyspnea, leg swelling, palpitations, orthopnea. Does say his right thumb is swelling for the past week but only mildly painful and improving. Thinks he hit is on something at work [name]). No known history of gout and it was never red or exquisitely tender.   Objective: Vitals:   04/18/21 1845 04/18/21  2047 04/19/21 0225 04/19/21 0526  BP: 133/79 114/66 (!) 114/57 105/66  Pulse: 77 (!) 102 68 77  Resp: 18 20 17 18   Temp: 98.7 F (37.1 C) 98.3 F (36.8 C) 98 F (36.7 C) 98.5 F (36.9 C)  TempSrc: Axillary Oral  Oral  SpO2: 100% 99% 100% 97%  Weight:      Height:        Intake/Output Summary (Last 24 hours) at 04/19/2021 1001 Last data filed at 04/19/2021 0320 Gross per 24 hour   Intake 2105.1 ml  Output 850 ml  Net 1255.1 ml   Filed Weights   04/17/21 1526  Weight: 106.6 kg   Gen: 60 y.o. male in no distress Pulm: Nonlabored breathing room air. Clear. CV: Regular rate and rhythm. No murmur, rub, or gallop. No JVD, no dependent edema. GI: Abdomen soft, non-tender, non-distended, with normoactive bowel sounds.  Ext: Warm, dry. Right thumb with modest restriction in PIP flexion ROM, hypertrophic mildly tender without erythema or effusion.  Skin: No new rashes, lesions or ulcers on visualized skin. Right foot with heavy bandaging that is c/d/I. Toes warm with diminished sensation, motor function preserved.  Neuro: Alert and oriented. Stable peripheral diminished sensation x4 extremities. No new focal neurological deficits. Psych: Judgement and insight appear fair. Mood euthymic & affect congruent. Behavior is appropriate. Intelligence may be low-normal.    Data Reviewed: I have personally reviewed following labs and imaging studies  CBC: Recent Labs  Lab 04/14/21 1306 04/14/21 1607 04/17/21 0959 04/18/21 0850 04/19/21 0104  WBC 12.1* 11.3* 10.7* 8.1 14.8*  NEUTROABS 8.6*  --  6.8  --   --   HGB 13.0 13.2 12.1* 13.2 11.3*  HCT 39.3 41.0 37.4* 40.4 34.0*  MCV 95.4 98.8 97.7 97.1 95.2  PLT 371 349 365 322 309   Basic Metabolic Panel: Recent Labs  Lab 04/14/21 1306 04/14/21 1607 04/17/21 0959 04/18/21 0500 04/18/21 0850 04/19/21 0104  NA 132*  --  135 137  --  135  K 5.0  --  4.6 4.9  --  5.2*  CL 100  --  105 105  --  106  CO2 22  --  22 22  --  23  GLUCOSE 259*  --  230* 195*  --  175*  BUN 30*  --  30* 22*  --  22*  CREATININE 1.68*  --  1.54* 1.29*  --  1.30*  CALCIUM 9.5  --  9.1 9.5  --  8.9  MG  --  1.9  --   --  1.8  --   PHOS  --  4.3  --   --  3.7  --    GFR: Estimated Creatinine Clearance: 78.1 mL/min (A) (by C-G formula based on SCr of 1.3 mg/dL (H)). Liver Function Tests: Recent Labs  Lab 04/14/21 1306 04/18/21 0500  AST 21  21  ALT 31 27  ALKPHOS 108 94  BILITOT 0.6 0.3  PROT 8.3* 7.8  ALBUMIN 2.9* 2.9*   No results for input(s): LIPASE, AMYLASE in the last 168 hours. No results for input(s): AMMONIA in the last 168 hours. Coagulation Profile: No results for input(s): INR, PROTIME in the last 168 hours. Cardiac Enzymes: No results for input(s): CKTOTAL, CKMB, CKMBINDEX, TROPONINI in the last 168 hours. BNP (last 3 results) No results for input(s): PROBNP in the last 8760 hours. HbA1C: No results for input(s): HGBA1C in the last 72 hours. CBG: Recent Labs  Lab 04/18/21 1212 04/18/21 1449  04/18/21 1622 04/18/21 2116 04/19/21 0733  GLUCAP 182* 194* 189* 243* 170*   Lipid Profile: No results for input(s): CHOL, HDL, LDLCALC, TRIG, CHOLHDL, LDLDIRECT in the last 72 hours. Thyroid Function Tests: No results for input(s): TSH, T4TOTAL, FREET4, T3FREE, THYROIDAB in the last 72 hours. Anemia Panel: No results for input(s): VITAMINB12, FOLATE, FERRITIN, TIBC, IRON, RETICCTPCT in the last 72 hours. Urine analysis:    Component Value Date/Time   COLORURINE YELLOW 02/07/2021 1521   APPEARANCEUR CLEAR 02/07/2021 1521   LABSPEC 1.014 02/07/2021 1521   PHURINE 5.0 02/07/2021 1521   GLUCOSEU 150 (A) 02/07/2021 1521   HGBUR SMALL (A) 02/07/2021 1521   BILIRUBINUR NEGATIVE 02/07/2021 1521   KETONESUR NEGATIVE 02/07/2021 1521   PROTEINUR NEGATIVE 02/07/2021 1521   NITRITE NEGATIVE 02/07/2021 1521   LEUKOCYTESUR NEGATIVE 02/07/2021 1521   Recent Results (from the past 240 hour(s))  Culture, blood (routine x 2)     Status: None   Collection Time: 04/14/21  4:30 PM   Specimen: Right Antecubital; Blood  Result Value Ref Range Status   Specimen Description RIGHT ANTECUBITAL  Final   Special Requests   Final    BOTTLES DRAWN AEROBIC AND ANAEROBIC Blood Culture adequate volume   Culture   Final    NO GROWTH 5 DAYS Performed at [facility], [address] [place], [place] [address]    Report  Status 04/19/2021 FINAL  Final  Resp Panel by RT-PCR (Flu A&B, Covid) Nasopharyngeal Swab     Status: None   Collection Time: 04/14/21  4:30 PM   Specimen: Nasopharyngeal Swab; Nasopharyngeal(NP) swabs in vial transport medium  Result Value Ref Range Status   SARS Coronavirus 2 by RT PCR NEGATIVE NEGATIVE Final    Comment: (NOTE) SARS-CoV-2 target nucleic acids are NOT DETECTED.  The SARS-CoV-2 RNA is generally detectable in upper respiratory specimens during the acute phase of infection. The lowest concentration of SARS-CoV-2 viral copies this assay can detect is 138 copies/mL. A negative result does not preclude SARS-Cov-2 infection and should not be used as the sole basis for treatment or other patient management decisions. A negative result may occur with  improper specimen collection/handling, submission of specimen other than nasopharyngeal swab, presence of viral mutation(s) within the areas targeted by this assay, and inadequate number of viral copies(<138 copies/mL). A negative result must be combined with clinical observations, patient history, and epidemiological information. The expected result is Negative.  Fact Sheet for Patients:  BloggerCourse.com  Fact Sheet for Healthcare Providers:  SeriousBroker.it  This test is no t yet approved or cleared by the [place] FDA and  has been authorized for detection and/or diagnosis of SARS-CoV-2 by FDA under an Emergency Use Authorization (EUA). This EUA will remain  in effect (meaning this test can be used) for the duration of the COVID-19 declaration under Section 564(b)(1) of the Act, 21 U.S.C.section 360bbb-3(b)(1), unless the authorization is terminated  or revoked sooner.       Influenza A by PCR NEGATIVE NEGATIVE Final   Influenza B by PCR NEGATIVE NEGATIVE Final    Comment: (NOTE) The Xpert Xpress SARS-CoV-2/FLU/RSV plus assay is intended as an aid in the  diagnosis of influenza from Nasopharyngeal swab specimens and should not be used as a sole basis for treatment. Nasal washings and aspirates are unacceptable for Xpert Xpress SARS-CoV-2/FLU/RSV testing.  Fact Sheet for Patients: BloggerCourse.com  Fact Sheet for Healthcare Providers: SeriousBroker.it  This test is not yet approved or cleared by the  [place] FDA and has been authorized for detection and/or diagnosis of SARS-CoV-2 by FDA under an TEFL teachermergency Use Authorization (EUA). This EUA will remain in effect (meaning this test can be used) for the duration of the COVID-19 declaration under Section 564(b)(1) of the Act, 21 U.S.C. section 360bbb-3(b)(1), unless the authorization is terminated or revoked.  Performed at [facility], [address] [place], [place] [address]   Resp Panel by RT-PCR (Flu A&B, Covid) Nasopharyngeal Swab     Status: None   Collection Time: 04/17/21  9:40 AM   Specimen: Nasopharyngeal Swab; Nasopharyngeal(NP) swabs in vial transport medium  Result Value Ref Range Status   SARS Coronavirus 2 by RT PCR NEGATIVE NEGATIVE Final    Comment: (NOTE) SARS-CoV-2 target nucleic acids are NOT DETECTED.  The SARS-CoV-2 RNA is generally detectable in upper respiratory specimens during the acute phase of infection. The lowest concentration of SARS-CoV-2 viral copies this assay can detect is 138 copies/mL. A negative result does not preclude SARS-Cov-2 infection and should not be used as the sole basis for treatment or other patient management decisions. A negative result may occur with  improper specimen collection/handling, submission of specimen other than nasopharyngeal swab, presence of viral mutation(s) within the areas targeted by this assay, and inadequate number of viral copies(<138 copies/mL). A negative result must be combined with clinical observations, patient history, and  epidemiological information. The expected result is Negative.  Fact Sheet for Patients:  BloggerCourse.comhttps://www.fda.gov/media/152166/download  Fact Sheet for Healthcare Providers:  SeriousBroker.ithttps://www.fda.gov/media/152162/download  This test is no t yet approved or cleared by the [place] FDA and  has been authorized for detection and/or diagnosis of SARS-CoV-2 by FDA under an Emergency Use Authorization (EUA). This EUA will remain  in effect (meaning this test can be used) for the duration of the COVID-19 declaration under Section 564(b)(1) of the Act, 21 U.S.C.section 360bbb-3(b)(1), unless the authorization is terminated  or revoked sooner.       Influenza A by PCR NEGATIVE NEGATIVE Final   Influenza B by PCR NEGATIVE NEGATIVE Final    Comment: (NOTE) The Xpert Xpress SARS-CoV-2/FLU/RSV plus assay is intended as an aid in the diagnosis of influenza from Nasopharyngeal swab specimens and should not be used as a sole basis for treatment. Nasal washings and aspirates are unacceptable for Xpert Xpress SARS-CoV-2/FLU/RSV testing.  Fact Sheet for Patients: BloggerCourse.comhttps://www.fda.gov/media/152166/download  Fact Sheet for Healthcare Providers: SeriousBroker.ithttps://www.fda.gov/media/152162/download  This test is not yet approved or cleared by the [place] FDA and has been authorized for detection and/or diagnosis of SARS-CoV-2 by FDA under an Emergency Use Authorization (EUA). This EUA will remain in effect (meaning this test can be used) for the duration of the COVID-19 declaration under Section 564(b)(1) of the Act, 21 U.S.C. section 360bbb-3(b)(1), unless the authorization is terminated or revoked.  Performed at [facility], [address] [place], [place] [address]   Aerobic/Anaerobic Culture w Gram Stain (surgical/deep wound)     Status: None (Preliminary result)   Collection Time: 04/18/21  2:22 PM   Specimen: Soft Tissue, Other  Result Value Ref Range Status   Specimen Description TISSUE  RIGHT FOOT  Final   Special Requests FROM ULER ABSCESS  Final   Gram Stain   Final    NO WBC SEEN NO ORGANISMS SEEN Performed at [facility], [address] [place], [place] [address]    Culture PENDING  Incomplete   Report Status PENDING  Incomplete  Radiology Studies: No results found.  Scheduled Meds:  atorvastatin  40 mg Oral Daily   docusate sodium  100 mg Oral BID   enoxaparin (LOVENOX) injection  40 mg Subcutaneous Q24H   folic acid  1 mg Oral Daily   insulin aspart  0-9 Units Subcutaneous TID WC   insulin glargine  10 Units Subcutaneous Daily   losartan  25 mg Oral Daily   multivitamin with minerals  1 tablet Oral Daily   sodium chloride flush  3 mL Intravenous Q12H   thiamine  100 mg Oral Daily   Or   thiamine  100 mg Intravenous Daily   Continuous Infusions:  sodium chloride 75 mL/hr at 04/18/21 0302   sodium chloride 75 mL/hr at 04/18/21 1619   ceFEPime (MAXIPIME) IV 2 g (04/19/21 0525)   vancomycin 1,500 mg (04/18/21 1620)     LOS: 2 days   Time spent: 35 minutes.  Tyrone Nine, MD [facility] [email or web address] 04/19/2021, 10:01 AM

## 2021-04-19 NOTE — Progress Notes (Addendum)
Reviewed insulin pen administration with patient. Patient was able to demonstrate steps for using insulin pen, following instructions X2 and returning demo without help. Will order insulin pen starter kit so that patient will have written instructions.   Harvel Ricks RN BSN CDE Diabetes Coordinator Pager: [phone]  8am-5pm

## 2021-04-19 NOTE — Evaluation (Addendum)
Occupational Therapy Evaluation Patient Details Name: Jesse [name] MRN: [id number] DOB: 1961-07-31 Today's Date: 04/19/2021    History of Present Illness Pt is a 60 y.o. male who presented 6/7 with worsening R foot infection. Per chart, he was diagnosed with DM2 and R foot cellulitis 3/30 and has since stepped on a nail and has left AMA for the same concern at least 2x. Pt with poor medication compliance and understanding. S/p excisional debridement of R foot and application of wound vac 6/8. PMH: hypertension, hyperlipidemia, diabetes mellitus type 2, and alcohol abuse.   Clinical Impression   PTA, pt was independent and lived alone. Currently, pt requires min guard for transfers, and functional mobility due to decreased balance. Pt requiring min A for LB ADLs. Pt requiring min verbal cues throughout session for safety and recall of precautions. Pt presenting with decreased strength, balance, and functional mobility. Recommend discharge home with [facility] and will continue to follow acutely to optimize independence and safety in ADLs and IADLs.    Follow Up Recommendations  Home health OT    Equipment Recommendations  3 in 1 bedside commode;Tub/shower bench    Recommendations for Other Services PT consult     Precautions / Restrictions Precautions Precautions: Fall;Other (comment) Precaution Comments: wound vac R foot Restrictions Weight Bearing Restrictions: Yes RLE Weight Bearing: Non weight bearing      Mobility Bed Mobility               General bed mobility comments: Pt sitting EOB on arrival.    Transfers Overall transfer level: Needs assistance Equipment used: Rolling walker (2 wheeled) Transfers: Sit to/from Stand Sit to Stand: Min guard         General transfer comment: [employer] for safety. Requiring cues to use BUEs to push up from bed.    Balance Overall balance assessment: Needs assistance Sitting-balance support: No upper extremity supported;Feet  unsupported;Feet supported Sitting balance-Leahy Scale: Good Sitting balance - Comments: Pt able to lift foot to don socks.   Standing balance support: Bilateral upper extremity supported;During functional activity Standing balance-Leahy Scale: Poor Standing balance comment: Reliant on BUE support on RW.                           ADL either performed or assessed with clinical judgement   ADL Overall ADL's : Needs assistance/impaired Eating/Feeding: Set up;Sitting   Grooming: Sitting;Modified independent   Upper Body Bathing: Supervision/ safety;Sitting   Lower Body Bathing: Sit to/from stand;Min guard   Upper Body Dressing : Modified independent;Sitting   Lower Body Dressing: Min guard;Sit to/from stand Lower Body Dressing Details (indicate cue type and reason): Pt donned socks during session with supervision for safety. Would require min guard to stand to pull up pants. Toilet Transfer: Lawyer and Hygiene: Sit to/from stand;Minimal assistance       Functional mobility during ADLs: Min guard;Rolling walker;Cueing for sequencing (initially requiring cueing for sequencing use of walker within precautions.) General ADL Comments: Pt requiring cues for safety and recall of precautionsprior to functional mobility.     Vision Baseline Vision/History: No visual deficits Vision Assessment?: No apparent visual deficits     Perception Perception Perception Tested?: No Comments: No apparent difficulty with perception.   Praxis Praxis Praxis tested?: Not tested Praxis-Other Comments: No apparent difficulty with motor planning.    Pertinent Vitals/Pain Pain Assessment: Faces Faces Pain Scale: Hurts a little bit Pain Location: R  foot Pain Intervention(s): Limited activity within patient's tolerance;Monitored during session (Pt reporting decreased pain after moving to the chair)     Hand Dominance Right   Extremity/Trunk  Assessment Upper Extremity Assessment Upper Extremity Assessment: Generalized weakness   Lower Extremity Assessment Lower Extremity Assessment: Defer to PT evaluation;RLE deficits/detail RLE Deficits / Details: R lower leg in ACE wrap with wound vac s/p debridement with orders for NWB, limiting mobility and strength   Cervical / Trunk Assessment Cervical / Trunk Assessment: Normal   Communication Communication Communication: No difficulties   Cognition Arousal/Alertness: Awake/alert Behavior During Therapy: WFL for tasks assessed/performed Overall Cognitive Status: Impaired/Different from baseline Area of Impairment: Awareness;Memory;Safety/judgement                     Memory: Decreased recall of precautions;Decreased short-term memory   Safety/Judgement: Decreased awareness of safety Awareness: Emergent   General Comments: Unsure of baseline cognitive function. Pt appears to have difficulty with memory, not recalling PT session yesterday or weight bearing precautions. Perseverating on desire for a wheelchair and difficulty with understaning that practicing functional mobility will help him return to PLOF in the future. Pt with decreased safety awareness and requiring cues to push up from bed. Further assess cognition in a future session.   General Comments  Pt reports wound on RLE was oozing last night but is doing much better now.    Exercises     Shoulder Instructions      Home Living Family/patient expects to be discharged to:: Private residence Living Arrangements: Alone Available Help at Discharge: Family;Available PRN/intermittently;Friend(s) Type of Home: House Home Access: Stairs to enter [employer] of Steps: 2 Entrance Stairs-Rails: None Home Layout: One level     Bathroom Shower/Tub: Chief Strategy Officer: Standard     Home Equipment: None   Additional Comments: Pt reports his driveway is sloped. Unsure amount of available  assistance at discharge. Pt reports family and neighbors can help if necessary.      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  Does own grocery shopping and household management independently        OT Problem List: Decreased strength;Decreased activity tolerance;Impaired balance (sitting and/or standing);Decreased safety awareness;Pain      OT Treatment/Interventions: Self-care/ADL training;Therapeutic exercise;DME and/or AE instruction;Therapeutic activities;Patient/family education    OT Goals(Current goals can be found in the care plan section) Acute Rehab OT Goals Patient Stated Goal: to get better OT Goal Formulation: With patient Time For Goal Achievement: 05/03/21 Potential to Achieve Goals: Good ADL Goals Pt Will Perform Lower Body Dressing: with modified independence;sit to/from stand (following RLE weight bearing precautions) Pt Will Transfer to Toilet: with modified independence;ambulating;regular height toilet (following RLE weight bearing precautions) Pt Will Perform Toileting - Clothing Manipulation and hygiene: with modified independence;sit to/from stand (following RLE weight bearing precautions) Pt Will Perform Tub/Shower Transfer: Tub transfer;with supervision;ambulating;shower seat (following RLE weight bearing precautions)  OT Frequency: Min 2X/week   Barriers to D/C:    Unclear amount of assistance available at discharge.       Co-evaluation              AM-PAC OT "6 Clicks" Daily Activity     Outcome Measure Help from another person eating meals?: A Little Help from another person taking care of personal grooming?: A Little Help from another person toileting, which includes using toliet, bedpan, or urinal?:  A Little Help from another person bathing (including washing, rinsing, drying)?: A Little Help from another person to put on and taking off regular upper body  clothing?: A Little Help from another person to put on and taking off regular lower body clothing?: A Little 6 Click Score: 18   End of Session Equipment Utilized During Treatment: Gait belt;Rolling walker Nurse Communication: Mobility status  Activity Tolerance: Patient tolerated treatment well Patient left: in chair;with call bell/phone within reach;with chair alarm set  OT Visit Diagnosis: Unsteadiness on feet (R26.81);Muscle weakness (generalized) (M62.81);Pain Pain - Right/Left: Right Pain - part of body: Ankle and joints of foot                Time: 1150-1210 OT Time Calculation (min): 20 min Charges:  OT General Charges $OT Visit: 1 Visit OT Evaluation $OT Eval Moderate Complexity: 1 [address], OTDS   Ladene Artist 04/19/2021, 1:19 PM

## 2021-04-19 NOTE — Progress Notes (Signed)
Orthopedic Tech Progress Note Patient Details:  CORBY VANDENBERGHE 1961-05-06 [id number]  Ortho Devices Type of Ortho Device: Crutches Ortho Device/Splint Interventions: Adjustment      Mikeala Girdler A Ercel Normoyle 04/19/2021, 5:37 PM

## 2021-04-20 ENCOUNTER — Encounter (HOSPITAL_COMMUNITY): Payer: Self-pay | Admitting: Internal Medicine

## 2021-04-20 ENCOUNTER — Encounter (HOSPITAL_COMMUNITY)
Admission: EM | Disposition: A | Discharge status: Home / Self Care | Payer: Self-pay | Source: Home / Self Care | Attending: Family Medicine

## 2021-04-20 ENCOUNTER — Other Ambulatory Visit (HOSPITAL_COMMUNITY): Payer: Self-pay

## 2021-04-20 ENCOUNTER — Inpatient Hospital Stay (HOSPITAL_COMMUNITY): Payer: Self-pay | Admitting: Certified Registered Nurse Anesthetist

## 2021-04-20 HISTORY — PX: I & D EXTREMITY: SHX5045

## 2021-04-20 LAB — BASIC METABOLIC PANEL
Anion gap: 8 (ref 5–15)
BUN: 24 mg/dL — ABNORMAL HIGH (ref 6–20)
CO2: 21 mmol/L — ABNORMAL LOW (ref 22–32)
Calcium: 9.1 mg/dL (ref 8.9–10.3)
Chloride: 107 mmol/L (ref 98–111)
Creatinine, Ser: 1.32 mg/dL — ABNORMAL HIGH (ref 0.61–1.24)
GFR, Estimated: >60 mL/min (ref >60)
Glucose, Bld: 208 mg/dL — ABNORMAL HIGH (ref 70–99)
Potassium: 4.6 mmol/L (ref 3.5–5.1)
Sodium: 136 mmol/L (ref 135–145)

## 2021-04-20 LAB — SURGICAL PCR SCREEN
MRSA, PCR: NEGATIVE
Staphylococcus aureus: POSITIVE — AB

## 2021-04-20 LAB — VITAMIN B12: Vitamin B-12: 197 pg/mL (ref 180–914)

## 2021-04-20 LAB — FERRITIN: Ferritin: 820 ng/mL — ABNORMAL HIGH (ref 24–336)

## 2021-04-20 LAB — CBC
HCT: 33.1 % — ABNORMAL LOW (ref 39.0–52.0)
Hemoglobin: 10.9 g/dL — ABNORMAL LOW (ref 13.0–17.0)
MCH: 31.5 pg (ref 26.0–34.0)
MCHC: 32.9 g/dL (ref 30.0–36.0)
MCV: 95.7 fL (ref 80.0–100.0)
Platelets: 278 10*3/uL (ref 150–400)
RBC: 3.46 MIL/uL — ABNORMAL LOW (ref 4.22–5.81)
RDW: 12.8 % (ref 11.5–15.5)
WBC: 9.1 10*3/uL (ref 4.0–10.5)
nRBC: 0 % (ref 0.0–0.2)

## 2021-04-20 LAB — GLUCOSE, CAPILLARY
Glucose-Capillary: 149 mg/dL — ABNORMAL HIGH (ref 70–99)
Glucose-Capillary: 188 mg/dL — ABNORMAL HIGH (ref 70–99)
Glucose-Capillary: 172 mg/dL — ABNORMAL HIGH (ref 70–99)
Glucose-Capillary: 142 mg/dL — ABNORMAL HIGH (ref 70–99)
Glucose-Capillary: 140 mg/dL — ABNORMAL HIGH (ref 70–99)
Glucose-Capillary: 247 mg/dL — ABNORMAL HIGH (ref 70–99)

## 2021-04-20 LAB — FOLATE: Folate: 10.5 ng/mL (ref >5.9)

## 2021-04-20 LAB — IRON AND TIBC
Iron: 46 ug/dL (ref 45–182)
Saturation Ratios: 21 % (ref 17.9–39.5)
TIBC: 214 ug/dL — ABNORMAL LOW (ref 250–450)
UIBC: 168 ug/dL

## 2021-04-20 SURGERY — IRRIGATION AND DEBRIDEMENT EXTREMITY
Anesthesia: General | Site: Foot | Laterality: Right

## 2021-04-20 MED ORDER — INSULIN GLARGINE 100 UNIT/ML SOLOSTAR PEN
10.0000 [IU] | PEN_INJECTOR | Freq: Every day | SUBCUTANEOUS | 0 refills | Status: DC
  Filled 2021-04-20: qty 6, 30d supply, fill #0

## 2021-04-20 MED ORDER — MIDAZOLAM HCL 2 MG/2ML IJ SOLN
INTRAMUSCULAR | Status: AC
  Filled 2021-04-20: qty 2

## 2021-04-20 MED ORDER — ACCU-CHEK GUIDE W/DEVICE KIT
PACK | 0 refills | Status: DC
Start: 2021-04-20 — End: 2022-06-23
  Filled 2021-04-20: qty 1, 1d supply, fill #0

## 2021-04-20 MED ORDER — ONDANSETRON HCL 4 MG/2ML IJ SOLN
INTRAMUSCULAR | Status: DC | PRN
  Administered 2021-04-20: 4 mg via INTRAVENOUS

## 2021-04-20 MED ORDER — 0.9 % SODIUM CHLORIDE (POUR BTL) OPTIME
TOPICAL | Status: DC | PRN
  Administered 2021-04-20 (×2): 1000 mL

## 2021-04-20 MED ORDER — ASCORBIC ACID 500 MG PO TABS
1000.0000 mg | ORAL_TABLET | Freq: Every day | ORAL | Status: DC
  Administered 2021-04-20 – 2021-04-23 (×4): 1000 mg via ORAL
  Filled 2021-04-20 (×4): qty 2

## 2021-04-20 MED ORDER — FENTANYL CITRATE (PF) 250 MCG/5ML IJ SOLN
INTRAMUSCULAR | Status: DC | PRN
  Administered 2021-04-20: 100 ug via INTRAVENOUS

## 2021-04-20 MED ORDER — ORAL CARE MOUTH RINSE: 15.0000 mL | Freq: Once | OROMUCOSAL | Status: AC

## 2021-04-20 MED ORDER — MUPIROCIN 2 % EX OINT
1.0000 "application " | TOPICAL_OINTMENT | Freq: Two times a day (BID) | CUTANEOUS | Status: DC
  Administered 2021-04-20 – 2021-04-23 (×7): 1 via NASAL
  Filled 2021-04-20: qty 22

## 2021-04-20 MED ORDER — CYANOCOBALAMIN 1000 MCG/ML IJ SOLN
1000.0000 ug | Freq: Once | INTRAMUSCULAR | Status: DC
  Filled 2021-04-20: qty 1

## 2021-04-20 MED ORDER — LIDOCAINE 2% (20 MG/ML) 5 ML SYRINGE
INTRAMUSCULAR | Status: DC | PRN
  Administered 2021-04-20: 60 mg via INTRAVENOUS

## 2021-04-20 MED ORDER — DEXAMETHASONE SODIUM PHOSPHATE 10 MG/ML IJ SOLN
INTRAMUSCULAR | Status: AC
  Filled 2021-04-20: qty 1

## 2021-04-20 MED ORDER — CHLORHEXIDINE GLUCONATE 0.12 % MT SOLN: 15.0000 mL | Freq: Once | OROMUCOSAL | Status: AC

## 2021-04-20 MED ORDER — PHENYLEPHRINE 40 MCG/ML (10ML) SYRINGE FOR IV PUSH (FOR BLOOD PRESSURE SUPPORT)
PREFILLED_SYRINGE | INTRAVENOUS | Status: AC
  Filled 2021-04-20: qty 10

## 2021-04-20 MED ORDER — INSULIN PEN NEEDLE 32G X 4 MM MISC
0 refills | Status: DC
  Filled 2021-04-20: qty 100, 30d supply, fill #0

## 2021-04-20 MED ORDER — FENTANYL CITRATE (PF) 250 MCG/5ML IJ SOLN
INTRAMUSCULAR | Status: AC
  Filled 2021-04-20: qty 5

## 2021-04-20 MED ORDER — ACCU-CHEK SOFTCLIX LANCETS MISC
5 refills | Status: DC
  Filled 2021-04-20 (×2): qty 100, 30d supply, fill #0

## 2021-04-20 MED ORDER — CHLORHEXIDINE GLUCONATE 0.12 % MT SOLN
OROMUCOSAL | Status: AC
  Administered 2021-04-20: 15 mL via OROMUCOSAL
  Filled 2021-04-20: qty 15

## 2021-04-20 MED ORDER — CEFAZOLIN SODIUM-DEXTROSE 2-4 GM/100ML-% IV SOLN
2.0000 g | INTRAVENOUS | Status: AC
  Administered 2021-04-20: 2 g via INTRAVENOUS
  Filled 2021-04-20: qty 100

## 2021-04-20 MED ORDER — DEXAMETHASONE SODIUM PHOSPHATE 10 MG/ML IJ SOLN
INTRAMUSCULAR | Status: DC | PRN
  Administered 2021-04-20: 4 mg via INTRAVENOUS

## 2021-04-20 MED ORDER — FENTANYL CITRATE (PF) 100 MCG/2ML IJ SOLN
INTRAMUSCULAR | Status: AC
  Filled 2021-04-20: qty 2

## 2021-04-20 MED ORDER — JUVEN PO PACK
1.0000 | PACK | Freq: Two times a day (BID) | ORAL | Status: DC
  Administered 2021-04-21 – 2021-04-23 (×6): 1 via ORAL
  Filled 2021-04-20 (×6): qty 1

## 2021-04-20 MED ORDER — SODIUM CHLORIDE 0.9 % IV SOLN: INTRAVENOUS | Status: DC

## 2021-04-20 MED ORDER — PHENYLEPHRINE 40 MCG/ML (10ML) SYRINGE FOR IV PUSH (FOR BLOOD PRESSURE SUPPORT)
PREFILLED_SYRINGE | INTRAVENOUS | Status: DC | PRN
  Administered 2021-04-20: 40 ug via INTRAVENOUS
  Administered 2021-04-20 (×2): 80 ug via INTRAVENOUS
  Administered 2021-04-20: 120 ug via INTRAVENOUS
  Administered 2021-04-20: 80 ug via INTRAVENOUS

## 2021-04-20 MED ORDER — LOSARTAN POTASSIUM 25 MG PO TABS
ORAL_TABLET | Freq: Every day | ORAL | 0 refills | Status: DC
  Filled 2021-04-20: qty 30, 30d supply, fill #0

## 2021-04-20 MED ORDER — FENTANYL CITRATE (PF) 100 MCG/2ML IJ SOLN
25.0000 ug | INTRAMUSCULAR | Status: DC | PRN
  Administered 2021-04-20 (×2): 50 ug via INTRAVENOUS

## 2021-04-20 MED ORDER — PHENYLEPHRINE HCL-NACL 10-0.9 MG/250ML-% IV SOLN
INTRAVENOUS | Status: DC | PRN
  Administered 2021-04-20: 25 ug/min via INTRAVENOUS

## 2021-04-20 MED ORDER — MIDAZOLAM HCL 2 MG/2ML IJ SOLN
INTRAMUSCULAR | Status: DC | PRN
  Administered 2021-04-20: 2 mg via INTRAVENOUS

## 2021-04-20 MED ORDER — ACCU-CHEK GUIDE VI STRP
ORAL_STRIP | 12 refills | Status: DC
Start: 2021-04-20 — End: 2022-06-23
  Filled 2021-04-20: qty 100, 30d supply, fill #0

## 2021-04-20 MED ORDER — ATORVASTATIN CALCIUM 40 MG PO TABS
40.0000 mg | ORAL_TABLET | Freq: Every day | ORAL | 0 refills | Status: DC
  Filled 2021-04-20: qty 30, 30d supply, fill #0

## 2021-04-20 MED ORDER — PROPOFOL 10 MG/ML IV BOLUS
INTRAVENOUS | Status: AC
  Filled 2021-04-20: qty 20

## 2021-04-20 MED ORDER — PROPOFOL 10 MG/ML IV BOLUS
INTRAVENOUS | Status: DC | PRN
  Administered 2021-04-20: 30 mg via INTRAVENOUS
  Administered 2021-04-20: 200 mg via INTRAVENOUS

## 2021-04-20 MED ORDER — LACTATED RINGERS IV SOLN: INTRAVENOUS | Status: DC

## 2021-04-20 MED ORDER — EPHEDRINE SULFATE-NACL 50-0.9 MG/10ML-% IV SOSY
PREFILLED_SYRINGE | INTRAVENOUS | Status: DC | PRN
  Administered 2021-04-20: 5 mg via INTRAVENOUS

## 2021-04-20 MED ORDER — ONDANSETRON HCL 4 MG/2ML IJ SOLN
INTRAMUSCULAR | Status: AC
  Filled 2021-04-20: qty 2

## 2021-04-20 SURGICAL SUPPLY — 39 items
BLADE SURG 21 STRL SS (BLADE) ×2 IMPLANT
BNDG COHESIVE 4X5 TAN STRL (GAUZE/BANDAGES/DRESSINGS) ×1 IMPLANT
BNDG COHESIVE 6X5 TAN STRL LF (GAUZE/BANDAGES/DRESSINGS) ×1 IMPLANT
BNDG GAUZE ELAST 4 BULKY (GAUZE/BANDAGES/DRESSINGS) ×2 IMPLANT
COVER SURGICAL LIGHT HANDLE (MISCELLANEOUS) ×4 IMPLANT
COVER WAND RF STERILE (DRAPES) IMPLANT
DRAPE DERMATAC (DRAPES) ×1 IMPLANT
DRAPE U-SHAPE 47X51 STRL (DRAPES) ×2 IMPLANT
DRESSING VERAFLO CLEANSE CC (GAUZE/BANDAGES/DRESSINGS) IMPLANT
DRSG ADAPTIC 3X8 NADH LF (GAUZE/BANDAGES/DRESSINGS) ×2 IMPLANT
DRSG VERAFLO CLEANSE CC (GAUZE/BANDAGES/DRESSINGS) ×2
DURAPREP 26ML APPLICATOR (WOUND CARE) ×2 IMPLANT
ELECT REM PT RETURN 9FT ADLT (ELECTROSURGICAL)
ELECTRODE REM PT RTRN 9FT ADLT (ELECTROSURGICAL) IMPLANT
GAUZE SPONGE 4X4 12PLY STRL (GAUZE/BANDAGES/DRESSINGS) ×2 IMPLANT
GLOVE BIOGEL PI IND STRL 9 (GLOVE) ×1 IMPLANT
GLOVE BIOGEL PI INDICATOR 9 (GLOVE) ×1
GLOVE SURG ORTHO 9.0 STRL STRW (GLOVE) ×2 IMPLANT
GOWN STRL REUS W/ TWL XL LVL3 (GOWN DISPOSABLE) ×2 IMPLANT
GOWN STRL REUS W/TWL XL LVL3 (GOWN DISPOSABLE) ×4
GRAFT SKIN WND OMEGA3 7X10 (Tissue) ×2 IMPLANT
GRAFT SKN 7X10XSTRL LF DISP (Tissue) IMPLANT
HANDPIECE INTERPULSE COAX TIP (DISPOSABLE)
KIT BASIN OR (CUSTOM PROCEDURE TRAY) ×2 IMPLANT
KIT TURNOVER KIT B (KITS) ×2 IMPLANT
MANIFOLD NEPTUNE II (INSTRUMENTS) ×2 IMPLANT
MICROMATRIX 1000MG (Tissue) ×2 IMPLANT
NS IRRIG 1000ML POUR BTL (IV SOLUTION) ×2 IMPLANT
PACK ORTHO EXTREMITY (CUSTOM PROCEDURE TRAY) ×2 IMPLANT
PAD ARMBOARD 7.5X6 YLW CONV (MISCELLANEOUS) ×4 IMPLANT
SET HNDPC FAN SPRY TIP SCT (DISPOSABLE) IMPLANT
SOLUTION PARTIC MCRMTRX 1000MG (Tissue) IMPLANT
STOCKINETTE IMPERVIOUS 9X36 MD (GAUZE/BANDAGES/DRESSINGS) IMPLANT
SUT ETHILON 2 0 PSLX (SUTURE) ×2 IMPLANT
SWAB COLLECTION DEVICE MRSA (MISCELLANEOUS) ×2 IMPLANT
SWAB CULTURE ESWAB REG 1ML (MISCELLANEOUS) IMPLANT
TOWEL GREEN STERILE (TOWEL DISPOSABLE) ×2 IMPLANT
TUBE CONNECTING 12X1/4 (SUCTIONS) ×2 IMPLANT
YANKAUER SUCT BULB TIP NO VENT (SUCTIONS) ×2 IMPLANT

## 2021-04-20 NOTE — Progress Notes (Signed)
Occupational Therapy Treatment Patient Details Name: Jesse [name] MRN: [id number] DOB: 12-Dec-1960 Today's Date: 04/20/2021    History of present illness Pt is a 60 y.o. male who presented 6/7 with worsening R foot infection. Per chart, he was diagnosed with DM2 and R foot cellulitis 3/30 and has since stepped on a nail and has left AMA for the same concern at least 2x. Pt with poor medication compliance and understanding. S/p excisional debridement of R foot and application of wound vac 6/8. PMH: hypertension, hyperlipidemia, diabetes mellitus type 2, and alcohol abuse.   OT comments  This 60 yo male admitted and underwent above seen today to check on DME that had been recommended (pt unsure of who had talked to him and what he was getting, later I chatted with CM and he got a tub bench and 3n1); practice toilet transfers (does better with 3n1 over toilet than with comfort height toilet/grab bar--but still min guard A); and LBD  with setup min guard A sit<>stand. Pt still having difficulty with recalling and following through with WB'ing status.  Follow Up Recommendations  Home health OT;Supervision - Intermittent    Equipment Recommendations  3 in 1 bedside commode;Tub/shower bench       Precautions / Restrictions Precautions Precautions: Fall Precaution Comments: wound vac R foot Restrictions Weight Bearing Restrictions: Yes       Mobility Bed Mobility               General bed mobility comments: Pt sitting EOB on arrival.    Transfers Overall transfer level: Needs assistance Equipment used: Rolling walker (2 wheeled) Transfers: Sit to/from Stand Sit to Stand: Min guard              Balance Overall balance assessment: Needs assistance Sitting-balance support: No upper extremity supported;Feet supported Sitting balance-Leahy Scale: Good     Standing balance support: Bilateral upper extremity supported Standing balance-Leahy Scale: Poor                              ADL either performed or assessed with clinical judgement   ADL Overall ADL's : Needs assistance/impaired                         Toilet Transfer: Min guard;Ambulation;RW;Comfort height toilet;Grab bars                   Vision Patient Visual Report: No change from baseline            Cognition Arousal/Alertness: Awake/alert Behavior During Therapy: WFL for tasks assessed/performed Overall Cognitive Status: Impaired/Different from baseline Area of Impairment: Safety/judgement;Orientation                 Orientation Level: Disoriented to;Time (could not get year correct, had all the right numbers (2,0) but never said it correctly)   Memory: Decreased recall of precautions (for sit<>stand)                             Pertinent Vitals/ Pain       Faces Pain Scale: Hurts a little bit Pain Location: R foot Pain Descriptors / Indicators: Guarding;Sore Pain Intervention(s): Limited activity within patient's tolerance;Monitored during session;Repositioned         Frequency  Min 2X/week        Progress Toward Goals  OT Goals(current goals can now be  found in the care plan section)  Progress towards OT goals: Progressing toward goals  Acute Rehab OT Goals Patient Stated Goal: to have surgery today and go home soon OT Goal Formulation: With patient Time For Goal Achievement: 05/03/21 Potential to Achieve Goals: Good  Plan Discharge plan remains appropriate       AM-PAC OT "6 Clicks" Daily Activity     Outcome Measure   Help from another person eating meals?: None Help from another person taking care of personal grooming?: A Little Help from another person toileting, which includes using toliet, bedpan, or urinal?: A Little Help from another person bathing (including washing, rinsing, drying)?: A Little Help from another person to put on and taking off regular upper body clothing?: A Little Help from another person  to put on and taking off regular lower body clothing?: A Little 6 Click Score: 19    End of Session Equipment Utilized During Treatment: Gait belt;Rolling walker  OT Visit Diagnosis: Unsteadiness on feet (R26.81);Muscle weakness (generalized) (M62.81);Pain;Other symptoms and signs involving cognitive function Pain - Right/Left: Right Pain - part of body: Ankle and joints of foot   Activity Tolerance Patient tolerated treatment well   Patient Left in chair;with call bell/phone within reach;with chair alarm set           Time: 1023-1050 OT Time Calculation (min): 27 min  Charges: OT General Charges $OT Visit: 1 Visit OT Treatments $Self Care/Home Management : 23-37 mins  Ignacia Palma, OTR/L Acute [employer] Pager [phone] Office [phone]     Evette Georges 04/20/2021, 4:50 PM

## 2021-04-20 NOTE — Anesthesia Postprocedure Evaluation (Signed)
Anesthesia Post Note  Patient: Jesse [name]  Procedure(s) Performed: EXCISIONAL DEBRIDEMENT RIGHT FOOT, APPLICATION OF SKIN GRAFT (Right: Foot)     Patient location during evaluation: PACU Anesthesia Type: General Level of consciousness: awake Pain management: pain level controlled Vital Signs Assessment: post-procedure vital signs reviewed and stable Respiratory status: spontaneous breathing Cardiovascular status: stable Postop Assessment: no apparent nausea or vomiting Anesthetic complications: no   No notable events documented.  Last Vitals:  Vitals:   04/20/21 1653 04/20/21 1713  BP: (!) 148/92 (!) 156/86  Pulse: 75 79  Resp: 15 17  Temp: 36.7 C (!) 36.4 C  SpO2: 98% 99%    Last Pain:  Vitals:   04/20/21 1713  TempSrc: Oral  PainSc:                  Macarthur Lorusso

## 2021-04-20 NOTE — Progress Notes (Signed)
PT Cancellation Note  Patient Details Name: Jesse [name] MRN: [id number] DOB: 20-Mar-1961   Cancelled Treatment:    Reason Eval/Treat Not Completed: (P) Patient at procedure or test/unavailable (Off unit for I & D to R foot, will follow up per POC.)   Fabiana Dromgoole Artis Delay 04/20/2021, 3:12 PM  Bonney Leitz , PTA Acute Rehabilitation Services Pager [phone] Office [phone]

## 2021-04-20 NOTE — Op Note (Signed)
04/20/2021  4:12 PM  PATIENT:  Jesse [name]    PRE-OPERATIVE DIAGNOSIS:  Right Foot Abscess  POST-OPERATIVE DIAGNOSIS:  Same  PROCEDURE:  EXCISIONAL DEBRIDEMENT RIGHT FOOT, APPLICATION OF KERECIS AND ACELL SKIN GRAFT Application of cleanse choice wound VAC sponge  SURGEON:  Nadara Mustard, MD  PHYSICIAN ASSISTANT:None ANESTHESIA:   General  PREOPERATIVE INDICATIONS:  Jesse [name] is a  60 y.o. male with a diagnosis of Right Foot Abscess who failed conservative measures and elected for surgical management.    The risks benefits and alternatives were discussed with the patient preoperatively including but not limited to the risks of infection, bleeding, nerve injury, cardiopulmonary complications, the need for revision surgery, among others, and the patient was willing to proceed.  OPERATIVE IMPLANTS: Kerecis skin graft 7 x 10 cm. ACell powder 1 g. Cleanse choice wound VAC sponges x2  @ENCIMAGES @  OPERATIVE FINDINGS: After debridement wound margins were clear with healthy viable tissue with good petechial bleeding.  OPERATIVE PROCEDURE: Patient brought the operating room underwent a general anesthetic.  After adequate levels anesthesia were obtained patient's right lower extremity was prepped using DuraPrep draped into a sterile field a timeout was called.  There was further necrotic tissue around the wound edges.  A 21 blade knife was used to further resect nonviable skin and soft tissue muscle and fascia.  This left a wound that was 13 x 13 cm.  The remaining wound bed had healthy granulation tissue this was irrigated with normal saline.  Electrocardio was used hemostasis.  1 g of ACell powder was placed over the wound this was then covered with Kerecis skin graft 7 x 10 cm.  This was secured with staples the reticulated foam followed by the thick solid foam was applied over the skin graft this was secured with derma tack this had a good suction fit this was overwrapped with Coban  patient was extubated taken the PACU in stable condition  Debridement type: Excisional Debridement  Side: right  Body Location: foot   Tools used for debridement: scalpel and rongeur  Pre-debridement Wound size (cm):   Length: 9        Width: 9     Depth: 1  Post-debridement Wound size (cm):   Length: 13        Width: 13     Depth: 1   Debridement depth beyond dead/damaged tissue down to healthy viable tissue: yes  Tissue layer involved: skin, subcutaneous tissue, muscle / fascia  Nature of tissue removed: Slough, Necrotic, Devitalized Tissue, and Non-viable tissue  Irrigation volume: 1 liter     Irrigation fluid type: Normal Saline    DISCHARGE PLANNING:  Antibiotic duration: Continue antibiotics through patient's hospital stay the wound bed was extremely contaminated however wound margins were clear at closure.  Weightbearing: Strict nonweightbearing on the right lower extremity  Pain medication: Opioid pathway  Dressing care/ Wound VAC: Continue wound VAC for 1 week after discharge  Ambulatory devices: Walker or crutches  Discharge to: Discharge planning based on therapy recommendations.  Follow-up: In the office 1 week post operative.

## 2021-04-20 NOTE — Anesthesia Preprocedure Evaluation (Signed)
Anesthesia Evaluation  Patient identified by MRN, date of birth, ID band Patient awake    Reviewed: Allergy & Precautions, NPO status , Patient's Chart, lab work & pertinent test results  Airway Mallampati: II  TM Distance: >3 FB     Dental   Pulmonary neg pulmonary ROS,    breath sounds clear to auscultation       Cardiovascular hypertension,  Rhythm:Regular Rate:Normal     Neuro/Psych PSYCHIATRIC DISORDERS negative neurological ROS     GI/Hepatic negative GI ROS, Neg liver ROS,   Endo/Other  diabetes  Renal/GU Renal disease     Musculoskeletal   Abdominal   Peds  Hematology   Anesthesia Other Findings   Reproductive/Obstetrics                             Anesthesia Physical Anesthesia Plan  ASA: 3  Anesthesia Plan: General   Post-op Pain Management:    Induction: Intravenous  PONV Risk Score and Plan: 2 and Ondansetron, Dexamethasone and Midazolam  Airway Management Planned: LMA  Additional Equipment:   Intra-op Plan:   Post-operative Plan: Extubation in OR  Informed Consent: I have reviewed the patients History and Physical, chart, labs and discussed the procedure including the risks, benefits and alternatives for the proposed anesthesia with the patient or authorized representative who has indicated his/her understanding and acceptance.     Dental advisory given  Plan Discussed with: CRNA and Anesthesiologist  Anesthesia Plan Comments:         Anesthesia Quick Evaluation

## 2021-04-20 NOTE — Anesthesia Procedure Notes (Signed)
Procedure Name: LMA Insertion Date/Time: 04/20/2021 3:31 PM Performed by: Jed Limerick, CRNA Pre-anesthesia Checklist: Patient identified, Emergency Drugs available, Suction available and Patient being monitored Patient Re-evaluated:Patient Re-evaluated prior to induction Oxygen Delivery Method: Circle System Utilized Preoxygenation: Pre-oxygenation with 100% oxygen Induction Type: IV induction LMA: LMA inserted LMA Size: 5.0 Number of attempts: 1 Placement Confirmation: positive ETCO2 Tube secured with: Tape Dental Injury: Teeth and Oropharynx as per pre-operative assessment

## 2021-04-20 NOTE — Progress Notes (Signed)
PROGRESS NOTE  Jesse [name]  SAY:[id number] DOB: 01-02-1961 DOA: 04/17/2021 PCP: Oneita Hurt, No   Brief Narrative: Jesse [name] is a 60 y.o. male with a longstanding history of poorly controlled diabetes, HTN, HLD, peripheral insensate diabetic neuropathy, and alcohol use who presented to the ED 6/7 with continued/worsening right foot wound despite taking antibiotics. He was admitted twice before (on 5/29 and again 6/4 - 6/5) for this problem but left AMA prior to definitive treatment. He was afebrile, WBC 10.7k, hemoglobin 12.1, BUN 30, creatinine 1.54, and glucose 230. Vancomycin, cefepime started, and orthopedics consulted, taking for I&D on 6/8, planning return to OR 6/10.   Assessment & Plan: Principal Problem:   Diabetic foot infection (HCC) Active Problems:   DM (diabetes mellitus), type 2, uncontrolled (HCC)   Leukocytosis   HTN (hypertension)   HLD (hyperlipidemia)   Cellulitis of right foot  Infected diabetic right foot ulcer with cellulitis, suspected abscess: - s/p I&D 6/8, 6/10.   - Blood cultures from 6/4 NGTD. Not redrawn this admission. - Continue vancomycin, cefepime. Leukocytosis resolved. Proteus growing in surgical culture though was reincubated. May narrow abx pending clinical course and finalized culture results.    Uncontrolled T2DM with peripheral neuropathy: HbA1c 10.9% on 04/14/21, poor adherence to medication regimen.  - SSI, lantus 10u daily (prescribed at DC) - Carb modified diet once cleared by orthopedics - Diabetes coordinator consulted. We've ordered and will provide the full host of diabetic supplies needed prior to his discharge. Pt has received intensive education and expresses firm understanding.  - With borderline B12 (level 197 with LLN 180) and severe neuropathy, possible ongoing EtOH use, will supplement B12. No evidence of folic acid or iron deficiency.   HTN:  - Continue losartan (will provide Rx at DC)  HLD:  - Continue statin (will provide at  DC)  AKI: Prerenal  - Improved. CrCl >46ml/min consistently.   Hyperkalemia: Resolved.  Alcohol abuse: Excessive daily drinking reported.  - DC CIWA. No withdrawal noted.  - Moderation counseling to be provided prior to discharge.   Right thumb swelling: Suspected to be traumatic with neuropathy. No current or previous history of erythema/swelling suggestive of gout or septic joint. No bony abnormalities on XR on my personal review.  - Supportive care.   Obesity: Estimated body mass index is 31.87 kg/m as calculated from the following:   Height as of this encounter: 6' (1.829 m).   Weight as of this encounter: 106.6 kg.  DVT prophylaxis: Lovenox Code Status: Full Family Communication: None at bedside Disposition Plan:  Status is: Inpatient  Remains inpatient appropriate because:Inpatient level of care appropriate due to severity of illness  Dispo: The patient is from: Home              Anticipated d/c is to: Home              Patient currently is not medically stable to d/c.   Difficult to place patient No  Consultants:  Orthopedics, Dr. Lajoyce Corners  Procedures:  04/18/2021 Dr. Lajoyce Corners: Excisional DEBRIDEMENT OF RIGHT FOOT. Application of cleanse choice wound VAC sponges x2.  Antimicrobials: Vancomycin, cefepime 6/7 >>   Subjective: Pain controlled without medications in past 24 hours. Feels he understands rationale and logistics of using lantus insulin. Supplies given at bedside. He denies any other concerns at this time. Right thumb pain improving today.   Objective: Vitals:   04/19/21 1039 04/19/21 1338 04/19/21 2044 04/20/21 0451  BP: (!) 105/54 (!) 90/59 119/69 Marland Kitchen)  141/78  Pulse: 72 76 65 83  Resp: 19 18 18 18   Temp: (!) 97.5 F (36.4 C) 98.1 F (36.7 C) 98.2 F (36.8 C) 98.1 F (36.7 C)  TempSrc: Oral Oral Oral Oral  SpO2: 100% 98% 99% 96%  Weight:      Height:        Intake/Output Summary (Last 24 hours) at 04/20/2021 1259 Last data filed at 04/20/2021  0850 Gross per 24 hour  Intake 1440 ml  Output 180 ml  Net 1260 ml   Filed Weights   04/17/21 1526  Weight: 106.6 kg   Gen: 60 y.o. male in no distress Pulm: Nonlabored breathing room air. Clear. CV: Regular rate and rhythm. No murmur, rub, or gallop. No JVD, no dependent edema. GI: Abdomen soft, non-tender, non-distended, with normoactive bowel sounds.  Ext: Warm, no deformities.  Skin: No new rashes, lesions or ulcers on visualized skin. RLE dressing c/d/I. Neuro: Alert and oriented. Stable distal diminished sensation. No new focal neurological deficits. Psych: Judgement and insight appear fair. Mood euthymic & affect congruent. Behavior is appropriate, intellect low-normal.     Data Reviewed: I have personally reviewed following labs and imaging studies  CBC: Recent Labs  Lab 04/14/21 1306 04/14/21 1607 04/17/21 0959 04/18/21 0850 04/19/21 0104 04/20/21 0139  WBC 12.1* 11.3* 10.7* 8.1 14.8* 9.1  NEUTROABS 8.6*  --  6.8  --   --   --   HGB 13.0 13.2 12.1* 13.2 11.3* 10.9*  HCT 39.3 41.0 37.4* 40.4 34.0* 33.1*  MCV 95.4 98.8 97.7 97.1 95.2 95.7  PLT 371 349 365 322 309 278   Basic Metabolic Panel: Recent Labs  Lab 04/14/21 1306 04/14/21 1607 04/17/21 0959 04/18/21 0500 04/18/21 0850 04/19/21 0104 04/20/21 0139  NA 132*  --  135 137  --  135 136  K 5.0  --  4.6 4.9  --  5.2* 4.6  CL 100  --  105 105  --  106 107  CO2 22  --  22 22  --  23 21*  GLUCOSE 259*  --  230* 195*  --  175* 208*  BUN 30*  --  30* 22*  --  22* 24*  CREATININE 1.68*  --  1.54* 1.29*  --  1.30* 1.32*  CALCIUM 9.5  --  9.1 9.5  --  8.9 9.1  MG  --  1.9  --   --  1.8  --   --   PHOS  --  4.3  --   --  3.7  --   --    GFR: Estimated Creatinine Clearance: 77 mL/min (A) (by C-G formula based on SCr of 1.32 mg/dL (H)). Liver Function Tests: Recent Labs  Lab 04/14/21 1306 04/18/21 0500  AST 21 21  ALT 31 27  ALKPHOS 108 94  BILITOT 0.6 0.3  PROT 8.3* 7.8  ALBUMIN 2.9* 2.9*   No  results for input(s): LIPASE, AMYLASE in the last 168 hours. No results for input(s): AMMONIA in the last 168 hours. Coagulation Profile: No results for input(s): INR, PROTIME in the last 168 hours. Cardiac Enzymes: No results for input(s): CKTOTAL, CKMB, CKMBINDEX, TROPONINI in the last 168 hours. BNP (last 3 results) No results for input(s): PROBNP in the last 8760 hours. HbA1C: No results for input(s): HGBA1C in the last 72 hours. CBG: Recent Labs  Lab 04/19/21 1201 04/19/21 1655 04/19/21 2112 04/20/21 0853 04/20/21 1146  GLUCAP 184* 210* 223* 149* 188*   Lipid Profile:  No results for input(s): CHOL, HDL, LDLCALC, TRIG, CHOLHDL, LDLDIRECT in the last 72 hours. Thyroid Function Tests: No results for input(s): TSH, T4TOTAL, FREET4, T3FREE, THYROIDAB in the last 72 hours. Anemia Panel: Recent Labs    04/20/21 0139  VITAMINB12 197  FOLATE 10.5  FERRITIN 820*  TIBC 214*  IRON 46   Urine analysis:    Component Value Date/Time   COLORURINE YELLOW 02/07/2021 1521   APPEARANCEUR CLEAR 02/07/2021 1521   LABSPEC 1.014 02/07/2021 1521   PHURINE 5.0 02/07/2021 1521   GLUCOSEU 150 (A) 02/07/2021 1521   HGBUR SMALL (A) 02/07/2021 1521   BILIRUBINUR NEGATIVE 02/07/2021 1521   KETONESUR NEGATIVE 02/07/2021 1521   PROTEINUR NEGATIVE 02/07/2021 1521   NITRITE NEGATIVE 02/07/2021 1521   LEUKOCYTESUR NEGATIVE 02/07/2021 1521   Recent Results (from the past 240 hour(s))  Culture, blood (routine x 2)     Status: None   Collection Time: 04/14/21  4:30 PM   Specimen: Right Antecubital; Blood  Result Value Ref Range Status   Specimen Description RIGHT ANTECUBITAL  Final   Special Requests   Final    BOTTLES DRAWN AEROBIC AND ANAEROBIC Blood Culture adequate volume   Culture   Final    NO GROWTH 5 DAYS Performed at [facility], [address] [place], [place] [address]    Report Status 04/19/2021 FINAL  Final  Resp Panel by RT-PCR (Flu A&B, Covid) Nasopharyngeal Swab      Status: None   Collection Time: 04/14/21  4:30 PM   Specimen: Nasopharyngeal Swab; Nasopharyngeal(NP) swabs in vial transport medium  Result Value Ref Range Status   SARS Coronavirus 2 by RT PCR NEGATIVE NEGATIVE Final    Comment: (NOTE) SARS-CoV-2 target nucleic acids are NOT DETECTED.  The SARS-CoV-2 RNA is generally detectable in upper respiratory specimens during the acute phase of infection. The lowest concentration of SARS-CoV-2 viral copies this assay can detect is 138 copies/mL. A negative result does not preclude SARS-Cov-2 infection and should not be used as the sole basis for treatment or other patient management decisions. A negative result may occur with  improper specimen collection/handling, submission of specimen other than nasopharyngeal swab, presence of viral mutation(s) within the areas targeted by this assay, and inadequate number of viral copies(<138 copies/mL). A negative result must be combined with clinical observations, patient history, and epidemiological information. The expected result is Negative.  Fact Sheet for Patients:  BloggerCourse.com  Fact Sheet for Healthcare Providers:  SeriousBroker.it  This test is no t yet approved or cleared by the [place] FDA and  has been authorized for detection and/or diagnosis of SARS-CoV-2 by FDA under an Emergency Use Authorization (EUA). This EUA will remain  in effect (meaning this test can be used) for the duration of the COVID-19 declaration under Section 564(b)(1) of the Act, 21 U.S.C.section 360bbb-3(b)(1), unless the authorization is terminated  or revoked sooner.       Influenza A by PCR NEGATIVE NEGATIVE Final   Influenza B by PCR NEGATIVE NEGATIVE Final    Comment: (NOTE) The Xpert Xpress SARS-CoV-2/FLU/RSV plus assay is intended as an aid in the diagnosis of influenza from Nasopharyngeal swab specimens and should not be used as a sole basis  for treatment. Nasal washings and aspirates are unacceptable for Xpert Xpress SARS-CoV-2/FLU/RSV testing.  Fact Sheet for Patients: BloggerCourse.com  Fact Sheet for Healthcare Providers: SeriousBroker.it  This test is not yet approved or cleared by the [place] and has been authorized for  detection and/or diagnosis of SARS-CoV-2 by FDA under an Emergency Use Authorization (EUA). This EUA will remain in effect (meaning this test can be used) for the duration of the COVID-19 declaration under Section 564(b)(1) of the Act, 21 U.S.C. section 360bbb-3(b)(1), unless the authorization is terminated or revoked.  Performed at [facility], [address], [place], [place] [address]   Resp Panel by RT-PCR (Flu A&B, Covid) Nasopharyngeal Swab     Status: None   Collection Time: 04/17/21  9:40 AM   Specimen: Nasopharyngeal Swab; Nasopharyngeal(NP) swabs in vial transport medium  Result Value Ref Range Status   SARS Coronavirus 2 by RT PCR NEGATIVE NEGATIVE Final    Comment: (NOTE) SARS-CoV-2 target nucleic acids are NOT DETECTED.  The SARS-CoV-2 RNA is generally detectable in upper respiratory specimens during the acute phase of infection. The lowest concentration of SARS-CoV-2 viral copies this assay can detect is 138 copies/mL. A negative result does not preclude SARS-Cov-2 infection and should not be used as the sole basis for treatment or other patient management decisions. A negative result may occur with  improper specimen collection/handling, submission of specimen other than nasopharyngeal swab, presence of viral mutation(s) within the areas targeted by this assay, and inadequate number of viral copies(<138 copies/mL). A negative result must be combined with clinical observations, patient history, and epidemiological information. The expected result is Negative.  Fact Sheet for Patients:   BloggerCourse.com  Fact Sheet for Healthcare Providers:  SeriousBroker.it  This test is no t yet approved or cleared by the [place] FDA and  has been authorized for detection and/or diagnosis of SARS-CoV-2 by FDA under an Emergency Use Authorization (EUA). This EUA will remain  in effect (meaning this test can be used) for the duration of the COVID-19 declaration under Section 564(b)(1) of the Act, 21 U.S.C.section 360bbb-3(b)(1), unless the authorization is terminated  or revoked sooner.       Influenza A by PCR NEGATIVE NEGATIVE Final   Influenza B by PCR NEGATIVE NEGATIVE Final    Comment: (NOTE) The Xpert Xpress SARS-CoV-2/FLU/RSV plus assay is intended as an aid in the diagnosis of influenza from Nasopharyngeal swab specimens and should not be used as a sole basis for treatment. Nasal washings and aspirates are unacceptable for Xpert Xpress SARS-CoV-2/FLU/RSV testing.  Fact Sheet for Patients: BloggerCourse.com  Fact Sheet for Healthcare Providers: SeriousBroker.it  This test is not yet approved or cleared by the [place] FDA and has been authorized for detection and/or diagnosis of SARS-CoV-2 by FDA under an Emergency Use Authorization (EUA). This EUA will remain in effect (meaning this test can be used) for the duration of the COVID-19 declaration under Section 564(b)(1) of the Act, 21 U.S.C. section 360bbb-3(b)(1), unless the authorization is terminated or revoked.  Performed at [facility], [address], [place], [place] [address]   Aerobic/Anaerobic Culture w Gram Stain (surgical/deep wound)     Status: None (Preliminary result)   Collection Time: 04/18/21  2:22 PM   Specimen: Soft Tissue, Other  Result Value Ref Range Status   Specimen Description TISSUE RIGHT FOOT  Final   Special Requests FROM ULER ABSCESS  Final   Gram Stain NO WBC  SEEN NO ORGANISMS SEEN   Final   Culture   Final    RARE PROTEUS MIRABILIS SUSCEPTIBILITIES TO FOLLOW CULTURE REINCUBATED FOR BETTER GROWTH Performed at [facility], [address], [place], [place] [address]    Report Status PENDING  Incomplete  Surgical pcr screen     Status: Abnormal   Collection Time: 04/20/21  2:22 AM   Specimen: Nasal Mucosa; Nasal Swab  Result Value Ref Range Status   MRSA, PCR NEGATIVE NEGATIVE Final   Staphylococcus aureus POSITIVE (A) NEGATIVE Final    Comment: (NOTE) The Xpert SA Assay (FDA approved for NASAL specimens in patients 71 years of age and older), is one component of a comprehensive surveillance program. It is not intended to diagnose infection nor to guide or monitor treatment. Performed at [facility], [address], [place], [place] [address]       Radiology Studies: DG Finger Thumb Right  Result Date: 04/19/2021 CLINICAL DATA:  Right thumb swelling. EXAM: RIGHT THUMB 2+V COMPARISON:  None. FINDINGS: There is no evidence of fracture or dislocation. Moderate osteophyte formation is seen involving the first interphalangeal joint suggesting osteoarthritis. Soft tissue swelling of the thumb is noted suggesting possible cellulitis. IMPRESSION: Diffuse soft tissue swelling is noted concerning for possible cellulitis. 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 No fracture or dislocation is noted. Electronically Signed   By: Lupita Raider M.D.   On: 04/19/2021 12:30    Scheduled Meds:  atorvastatin  40 mg Oral Daily   docusate sodium  100 mg Oral BID   folic acid  1 mg Oral Daily   insulin aspart  0-9 Units Subcutaneous TID WC   insulin glargine  10 Units Subcutaneous Daily   losartan  25 mg Oral Daily   multivitamin with minerals  1 tablet Oral Daily   mupirocin ointment  1 application Nasal BID   sodium chloride flush  3 mL Intravenous Q12H   thiamine  100 mg Oral Daily   Continuous Infusions:   ceFAZolin  (ANCEF) IV     ceFEPime (MAXIPIME) IV 2 g (04/20/21 0507)   vancomycin 1,500 mg (04/19/21 1550)     LOS: 3 days   Time spent: 35 minutes.  Tyrone Nine, MD [facility] [email or web address] 04/20/2021, 12:59 PM

## 2021-04-20 NOTE — Transfer of Care (Signed)
Immediate Anesthesia Transfer of Care Note  Patient: Jesse [name]  Procedure(s) Performed: EXCISIONAL DEBRIDEMENT RIGHT FOOT, APPLICATION OF SKIN GRAFT (Right: Foot)  Patient Location: PACU  Anesthesia Type:General  Level of Consciousness: drowsy  Airway & Oxygen Therapy: Patient Spontanous Breathing and Patient connected to face mask oxygen  Post-op Assessment: Report given to RN and Post -op Vital signs reviewed and stable  Post vital signs: Reviewed and stable  Last Vitals:  Vitals Value Taken Time  BP 96/68 04/20/21 1608  Temp    Pulse 77 04/20/21 1609  Resp 24 04/20/21 1609  SpO2 100 % 04/20/21 1609  Vitals shown include unvalidated device data.  Last Pain:  Vitals:   04/20/21 1458  TempSrc: Oral  PainSc:       Patients Stated Pain Goal: 3 (04/19/21 0346)  Complications: No notable events documented.

## 2021-04-20 NOTE — Progress Notes (Signed)
Pharmacy Antibiotic Note  Jesse [name] is a 60 y.o. male admitted on 04/17/2021 with  wound infection . Continues on Vancomycin and Cefepime.  Patient with 2 week history of worsening foot wound after stepping on nail. Wound is purulent. Has been admitted last week but left AMA. Now with mild leukocytosis, afebrile, Scr improving  To OR today for debridement  Plan: Continue  vancomycin 1500mg  q24hr (estimated AUC 540 w/ Scr of 1.54) Continue Cefepime 2 grams iv Q 8 hours  Height: 6' (182.9 cm) Weight: 106.6 kg (235 lb) IBW/kg (Calculated) : 77.6  Temp (24hrs), Avg:98.1 F (36.7 C), Min:98.1 F (36.7 C), Max:98.2 F (36.8 C)  Recent Labs  Lab 04/14/21 1306 04/14/21 1506 04/14/21 1607 04/17/21 0959 04/18/21 0500 04/18/21 0850 04/19/21 0104 04/20/21 0139  WBC 12.1*  --  11.3* 10.7*  --  8.1 14.8* 9.1  CREATININE 1.68*  --   --  1.54* 1.29*  --  1.30* 1.32*  LATICACIDVEN 1.3 1.2  --   --   --   --   --   --      Estimated Creatinine Clearance: 77 mL/min (A) (by C-G formula based on SCr of 1.32 mg/dL (H)).    No Known Allergies  Thank you 06/20/21, PharmD  04/20/2021 12:51 PM

## 2021-04-20 NOTE — Interval H&P Note (Signed)
History and Physical Interval Note:  04/20/2021 6:55 AM  Jesse [name]  has presented today for surgery, with the diagnosis of Right Foot Abscess.  The various methods of treatment have been discussed with the patient and family. After consideration of risks, benefits and other options for treatment, the patient has consented to  Procedure(s): EXCISIONAL DEBRIDEMENT RIGHT FOOT, APPLICATION OF SKIN GRAFT (Right) as a surgical intervention.  The patient's history has been reviewed, patient examined, no change in status, stable for surgery.  I have reviewed the patient's chart and labs.  Questions were answered to the patient's satisfaction.     Nadara Mustard

## 2021-04-21 LAB — CBC
HCT: 31.9 % — ABNORMAL LOW (ref 39.0–52.0)
Hemoglobin: 10.7 g/dL — ABNORMAL LOW (ref 13.0–17.0)
MCH: 32.2 pg (ref 26.0–34.0)
MCHC: 33.5 g/dL (ref 30.0–36.0)
MCV: 96.1 fL (ref 80.0–100.0)
Platelets: 274 10*3/uL (ref 150–400)
RBC: 3.32 MIL/uL — ABNORMAL LOW (ref 4.22–5.81)
RDW: 12.8 % (ref 11.5–15.5)
WBC: 9.1 10*3/uL (ref 4.0–10.5)
nRBC: 0 % (ref 0.0–0.2)

## 2021-04-21 LAB — BASIC METABOLIC PANEL
Anion gap: 9 (ref 5–15)
BUN: 23 mg/dL — ABNORMAL HIGH (ref 6–20)
CO2: 20 mmol/L — ABNORMAL LOW (ref 22–32)
Calcium: 8.9 mg/dL (ref 8.9–10.3)
Chloride: 106 mmol/L (ref 98–111)
Creatinine, Ser: 1.17 mg/dL (ref 0.61–1.24)
GFR, Estimated: >60 mL/min (ref >60)
Glucose, Bld: 199 mg/dL — ABNORMAL HIGH (ref 70–99)
Potassium: 5 mmol/L (ref 3.5–5.1)
Sodium: 135 mmol/L (ref 135–145)

## 2021-04-21 LAB — GLUCOSE, CAPILLARY
Glucose-Capillary: 146 mg/dL — ABNORMAL HIGH (ref 70–99)
Glucose-Capillary: 250 mg/dL — ABNORMAL HIGH (ref 70–99)
Glucose-Capillary: 262 mg/dL — ABNORMAL HIGH (ref 70–99)
Glucose-Capillary: 219 mg/dL — ABNORMAL HIGH (ref 70–99)

## 2021-04-21 MED ORDER — CYANOCOBALAMIN 1000 MCG/ML IJ SOLN
1000.0000 ug | Freq: Once | INTRAMUSCULAR | Status: AC
  Administered 2021-04-21: 1000 ug via INTRAMUSCULAR
  Filled 2021-04-21: qty 1

## 2021-04-21 MED ORDER — INSULIN ASPART 100 UNIT/ML IJ SOLN
2.0000 [IU] | Freq: Three times a day (TID) | INTRAMUSCULAR | Status: DC
  Administered 2021-04-21 – 2021-04-23 (×6): 2 [IU] via SUBCUTANEOUS

## 2021-04-21 NOTE — Progress Notes (Signed)
Physical Therapy Treatment Patient Details Name: Jesse [name] MRN: [id number] DOB: February 23, 1961 Today's Date: 04/21/2021    History of Present Illness Pt is a 60 y.o. male who presented 6/7 with worsening R foot infection. Per chart, he was diagnosed with DM2 and R foot cellulitis 3/30 and has since stepped on a nail and has left AMA for the same concern at least 2x. Pt with poor medication compliance and understanding. S/p excisional debridement of R foot and application of wound vac 6/8. Repeat excisional debridement and placement of skin graft R foot 6/10. PMH: hypertension, hyperlipidemia, diabetes mellitus type 2, and alcohol abuse.    PT Comments    Pt required min guard assist transfers and ambulation in room with RW vs crutches. Good ability to maintain NWB RLE. Pt reports preference for RW over crutches due to increased stability. He reports his home set up would be manageable with RW.    Follow Up Recommendations  Outpatient PT;Supervision for mobility/OOB     Equipment Recommendations  3in1 (PT);Wheelchair (measurements PT);Wheelchair cushion (measurements PT);Rolling walker with 5" wheels    Recommendations for Other Services       Precautions / Restrictions Precautions Precautions: Fall;Other (comment) Precaution Comments: wound vac R foot Restrictions RLE Weight Bearing: Non weight bearing    Mobility  Bed Mobility   Bed Mobility: Supine to Sit;Sit to Supine     Supine to sit: Modified independent (Device/Increase time) Sit to supine: Modified independent (Device/Increase time)        Transfers Overall transfer level: Needs assistance Equipment used: Rolling walker (2 wheeled);Crutches   Sit to Stand: Min guard Stand pivot transfers: Min guard       General transfer comment: min guard for safety. Practiced with RW vs crutches.  Ambulation/Gait Ambulation/Gait assistance: Min guard Gait Distance (Feet): 25 Feet Assistive device: Rolling walker (2  wheeled);Crutches Gait Pattern/deviations: Step-to pattern Gait velocity: decreased Gait velocity interpretation: <1.31 ft/sec, indicative of household ambulator General Gait Details: hop-to pattern, good maintainance of NWB RLE, practiced with RW vs crutches. Pt prefers RW.   Stairs             Wheelchair Mobility    Modified Rankin (Stroke Patients Only)       Balance Overall balance assessment: Needs assistance Sitting-balance support: No upper extremity supported;Feet supported Sitting balance-Leahy Scale: Good     Standing balance support: Bilateral upper extremity supported;During functional activity Standing balance-Leahy Scale: Poor Standing balance comment: Reliant on BUE support on RW.                            Cognition Arousal/Alertness: Awake/alert Behavior During Therapy: WFL for tasks assessed/performed Overall Cognitive Status: No family/caregiver present to determine baseline cognitive functioning Area of Impairment: Safety/judgement                         Safety/Judgement: Decreased awareness of safety     General Comments: Unsure of baseline cognitive function. Unsure of retention of education reguarding medical status/mobility/healing. Appears to have decreased medical literacy. Just says 'ok, ok, ok' to everything.      Exercises      General Comments        Pertinent Vitals/Pain Pain Assessment: 0-10 Pain Score: 5  Pain Location: R foot Pain Descriptors / Indicators: Sore Pain Intervention(s): Monitored during session;Limited activity within patient's tolerance;Patient requesting pain meds-RN notified    Home Living  Prior Function            PT Goals (current goals can now be found in the care plan section) Acute Rehab PT Goals Patient Stated Goal: home soon Progress towards PT goals: Progressing toward goals    Frequency    Min 3X/week      PT Plan Current plan  remains appropriate    Co-evaluation              AM-PAC PT "6 Clicks" Mobility   Outcome Measure  Help needed turning from your back to your side while in a flat bed without using bedrails?: None Help needed moving from lying on your back to sitting on the side of a flat bed without using bedrails?: None Help needed moving to and from a bed to a chair (including a wheelchair)?: A Little Help needed standing up from a chair using your arms (e.g., wheelchair or bedside chair)?: A Little Help needed to walk in hospital room?: A Little Help needed climbing 3-5 steps with a railing? : A Little 6 Click Score: 20    End of Session Equipment Utilized During Treatment: Gait belt;Other (comment) (wound vac) Activity Tolerance: Patient tolerated treatment well Patient left: in chair;with call bell/phone within reach Nurse Communication: Mobility status;Patient requests pain meds PT Visit Diagnosis: Unsteadiness on feet (R26.81);Other abnormalities of gait and mobility (R26.89);Muscle weakness (generalized) (M62.81);Difficulty in walking, not elsewhere classified (R26.2)     Time: 6314-9702 PT Time Calculation (min) (ACUTE ONLY): 24 min  Charges:  $Gait Training: 23-37 mins                     Aida Raider, [place]  Office # [phone] Pager [phone]    Ilda Foil 04/21/2021, 3:55 PM

## 2021-04-21 NOTE — Progress Notes (Addendum)
Inpatient Rehab Admissions Coordinator Note:   CIR consult received . Pt. Has not been seen by PT. I will wait on therapy notes with recommendations and plans for further surgery before making determination on CIR candidacy.   Megan Salon, MS, CCC-SLP Rehab Admissions Coordinator  [phone] (celll) [phone] (office)

## 2021-04-21 NOTE — Progress Notes (Signed)
PROGRESS NOTE  Jesse [name]  DEY:[id number] DOB: [birth year] DOA: 04/17/2021 PCP: Oneita Hurt, No   Brief Narrative: Jesse [name] is a 60 y.o. male with a longstanding history of poorly controlled diabetes, HTN, HLD, peripheral insensate diabetic neuropathy, and alcohol use who presented to the ED 6/7 with continued/worsening right foot wound despite taking antibiotics. He was admitted twice before (on 5/29 and again 6/4 - 6/5) for this problem but left AMA prior to definitive treatment. He was afebrile, WBC 10.7k, hemoglobin 12.1, BUN 30, creatinine 1.54, and glucose 230. Vancomycin, cefepime started, and orthopedics consulted, taking for I&D on 6/8, planning return to OR 6/10.   Assessment & Plan: Principal Problem:   Diabetic foot infection (HCC) Active Problems:   DM (diabetes mellitus), type 2, uncontrolled (HCC)   Leukocytosis   HTN (hypertension)   HLD (hyperlipidemia)   Cellulitis of right foot  Infected diabetic right foot ulcer with cellulitis, suspected abscess due to Proteus mirabilis: Based on wound culture from 6/8, pan-sensitive.  - NKDA. Will narrow abx to ampicillin while inpatient with transition to amoxicillin or similar at discharge. Blood cultures from 6/4 negative.  - s/p I&D 6/8, I&D with skin graft to massive wound on plantar aspect of right foot on 6/10 by Dr. Lajoyce Corners. Continue wound vac per orthopedics.   Uncontrolled T2DM with peripheral neuropathy: HbA1c 10.9% on 04/14/21, poor adherence to medication regimen.  - SSI, lantus 10u daily (prescribed at DC). Postprandials remain elevated, add low dose mealtime novolog starting this PM.  - Carb modified diet  - Diabetes coordinator consulted. We've ordered and will provide the full host of diabetic supplies needed prior to his discharge. Pt has received intensive education and expresses firm understanding.   Diabetic polyneuropathy:  - With borderline B12 (level 197 with LLN 180) and severe neuropathy, possible ongoing EtOH  use, will supplement B12 daily while inpatient (unclear if this was given yesterday). No evidence of folic acid or iron deficiency.   HTN:  - Continue losartan (will provide Rx at DC)  HLD:  - Continue statin (will provide at DC)  AKI: Prerenal  - Improved. CrCl >93ml/min consistently.   Hyperkalemia: Resolved.  Alcohol abuse: Excessive daily drinking reported.  - DC CIWA. No withdrawal noted.  - Moderation counseling to be provided prior to discharge.   Right thumb swelling: Suspected to be traumatic with neuropathy. No current or previous history of erythema/swelling suggestive of gout or septic joint. No bony abnormalities on XR on my personal review.  - Supportive care.   Obesity: Estimated body mass index is 31.87 kg/m as calculated from the following:   Height as of this encounter: 6' (1.829 m).   Weight as of this encounter: 106.6 kg.  DVT prophylaxis: Lovenox Code Status: Full Family Communication: None at bedside Disposition Plan:  Status is: Inpatient  Remains inpatient appropriate because:Inpatient level of care appropriate due to severity of illness  Dispo: The patient is from: Home              Anticipated d/c is to: Home              Patient currently is not medically stable to d/c.   Difficult to place patient No  Consultants:  Orthopedics, Dr. Lajoyce Corners  Procedures:  04/18/2021 Dr. Lajoyce Corners: Excisional DEBRIDEMENT OF RIGHT FOOT. Application of cleanse choice wound VAC sponges x2. 04/20/2021 Dr. Lajoyce Corners: EXCISIONAL DEBRIDEMENT RIGHT FOOT, APPLICATION OF KERECIS AND ACELL SKIN GRAFT. Application of cleanse choice wound VAC sponge  Antimicrobials: Vancomycin,  cefepime 6/7 - 6/11 Ampicillin 6/11 >>  Subjective: No complaints today, pain is minimal requiring about one dose of oxycodone every 24 hours.  Objective: Vitals:   04/20/21 1653 04/20/21 1713 04/20/21 2132 04/21/21 0521  BP: (!) 148/92 (!) 156/86 (!) 143/72 123/76  Pulse: 75 79 74 75  Resp: 15 17 18 17    Temp: 98 F (36.7 C) (!) 97.5 F (36.4 C) (!) 97.5 F (36.4 C) 98.5 F (36.9 C)  TempSrc:  Oral Oral Oral  SpO2: 98% 99% 96% 98%  Weight:      Height:        Intake/Output Summary (Last 24 hours) at 04/21/2021 1458 Last data filed at 04/21/2021 0900 Gross per 24 hour  Intake 962.98 ml  Output 975 ml  Net -12.02 ml   Filed Weights   04/17/21 1526  Weight: 106.6 kg   Gen: 60 y.o. male in no distress Pulm: Nonlabored breathing room air. Clear. CV: Regular rate and rhythm. No murmur, rub, or gallop. No JVD, no dependent edema. GI: Abdomen soft, non-tender, non-distended, with normoactive bowel sounds.  Ext: Warm, no deformities Skin: No new rashes, lesions or ulcers on visualized skin. RLE ACE wrap in place c/d/I with ~100cc wound vac discharge.  Neuro: Alert and oriented. No focal neurological deficits. Psych: Judgement and insight appear fair, below normal IQ suspected. Mood euthymic & affect congruent. Behavior is appropriate.    Data Reviewed: I have personally reviewed following labs and imaging studies  CBC: Recent Labs  Lab 04/17/21 0959 04/18/21 0850 04/19/21 0104 04/20/21 0139 04/21/21 0044  WBC 10.7* 8.1 14.8* 9.1 9.1  NEUTROABS 6.8  --   --   --   --   HGB 12.1* 13.2 11.3* 10.9* 10.7*  HCT 37.4* 40.4 34.0* 33.1* 31.9*  MCV 97.7 97.1 95.2 95.7 96.1  PLT 365 322 309 278 274   Basic Metabolic Panel: Recent Labs  Lab 04/14/21 1607 04/17/21 0959 04/18/21 0500 04/18/21 0850 04/19/21 0104 04/20/21 0139 04/21/21 0044  NA  --  135 137  --  135 136 135  K  --  4.6 4.9  --  5.2* 4.6 5.0  CL  --  105 105  --  106 107 106  CO2  --  22 22  --  23 21* 20*  GLUCOSE  --  230* 195*  --  175* 208* 199*  BUN  --  30* 22*  --  22* 24* 23*  CREATININE  --  1.54* 1.29*  --  1.30* 1.32* 1.17  CALCIUM  --  9.1 9.5  --  8.9 9.1 8.9  MG 1.9  --   --  1.8  --   --   --   PHOS 4.3  --   --  3.7  --   --   --    GFR: Estimated Creatinine Clearance: 86.8 mL/min (by C-G  formula based on SCr of 1.17 mg/dL). Liver Function Tests: Recent Labs  Lab 04/18/21 0500  AST 21  ALT 27  ALKPHOS 94  BILITOT 0.3  PROT 7.8  ALBUMIN 2.9*   No results for input(s): LIPASE, AMYLASE in the last 168 hours. No results for input(s): AMMONIA in the last 168 hours. Coagulation Profile: No results for input(s): INR, PROTIME in the last 168 hours. Cardiac Enzymes: No results for input(s): CKTOTAL, CKMB, CKMBINDEX, TROPONINI in the last 168 hours. BNP (last 3 results) No results for input(s): PROBNP in the last 8760 hours. HbA1C: No results for input(s):  HGBA1C in the last 72 hours. CBG: Recent Labs  Lab 04/20/21 1609 04/20/21 1759 04/20/21 2130 04/21/21 0752 04/21/21 1237  GLUCAP 142* 140* 247* 146* 250*   Lipid Profile: No results for input(s): CHOL, HDL, LDLCALC, TRIG, CHOLHDL, LDLDIRECT in the last 72 hours. Thyroid Function Tests: No results for input(s): TSH, T4TOTAL, FREET4, T3FREE, THYROIDAB in the last 72 hours. Anemia Panel: Recent Labs    04/20/21 0139  VITAMINB12 197  FOLATE 10.5  FERRITIN 820*  TIBC 214*  IRON 46   Urine analysis:    Component Value Date/Time   COLORURINE YELLOW 02/07/2021 1521   APPEARANCEUR CLEAR 02/07/2021 1521   LABSPEC 1.014 02/07/2021 1521   PHURINE 5.0 02/07/2021 1521   GLUCOSEU 150 (A) 02/07/2021 1521   HGBUR SMALL (A) 02/07/2021 1521   BILIRUBINUR NEGATIVE 02/07/2021 1521   KETONESUR NEGATIVE 02/07/2021 1521   PROTEINUR NEGATIVE 02/07/2021 1521   NITRITE NEGATIVE 02/07/2021 1521   LEUKOCYTESUR NEGATIVE 02/07/2021 1521   Recent Results (from the past 240 hour(s))  Culture, blood (routine x 2)     Status: None   Collection Time: 04/14/21  4:30 PM   Specimen: Right Antecubital; Blood  Result Value Ref Range Status   Specimen Description RIGHT ANTECUBITAL  Final   Special Requests   Final    BOTTLES DRAWN AEROBIC AND ANAEROBIC Blood Culture adequate volume   Culture   Final    NO GROWTH 5 DAYS Performed  at [facility], [address] [place], [place] [address]    Report Status 04/19/2021 FINAL  Final  Resp Panel by RT-PCR (Flu A&B, Covid) Nasopharyngeal Swab     Status: None   Collection Time: 04/14/21  4:30 PM   Specimen: Nasopharyngeal Swab; Nasopharyngeal(NP) swabs in vial transport medium  Result Value Ref Range Status   SARS Coronavirus 2 by RT PCR NEGATIVE NEGATIVE Final    Comment: (NOTE) SARS-CoV-2 target nucleic acids are NOT DETECTED.  The SARS-CoV-2 RNA is generally detectable in upper respiratory specimens during the acute phase of infection. The lowest concentration of SARS-CoV-2 viral copies this assay can detect is 138 copies/mL. A negative result does not preclude SARS-Cov-2 infection and should not be used as the sole basis for treatment or other patient management decisions. A negative result may occur with  improper specimen collection/handling, submission of specimen other than nasopharyngeal swab, presence of viral mutation(s) within the areas targeted by this assay, and inadequate number of viral copies(<138 copies/mL). A negative result must be combined with clinical observations, patient history, and epidemiological information. The expected result is Negative.  Fact Sheet for Patients:  BloggerCourse.com  Fact Sheet for Healthcare Providers:  SeriousBroker.it  This test is no t yet approved or cleared by the [place] FDA and  has been authorized for detection and/or diagnosis of SARS-CoV-2 by FDA under an Emergency Use Authorization (EUA). This EUA will remain  in effect (meaning this test can be used) for the duration of the COVID-19 declaration under Section 564(b)(1) of the Act, 21 U.S.C.section 360bbb-3(b)(1), unless the authorization is terminated  or revoked sooner.       Influenza A by PCR NEGATIVE NEGATIVE Final   Influenza B by PCR NEGATIVE NEGATIVE Final    Comment: (NOTE) The  Xpert Xpress SARS-CoV-2/FLU/RSV plus assay is intended as an aid in the diagnosis of influenza from Nasopharyngeal swab specimens and should not be used as a sole basis for treatment. Nasal washings and aspirates are unacceptable for Xpert Xpress SARS-CoV-2/FLU/RSV testing.  Fact Sheet for Patients: BloggerCourse.com  Fact Sheet for Healthcare Providers: SeriousBroker.it  This test is not yet approved or cleared by the [place] FDA and has been authorized for detection and/or diagnosis of SARS-CoV-2 by FDA under an Emergency Use Authorization (EUA). This EUA will remain in effect (meaning this test can be used) for the duration of the COVID-19 declaration under Section 564(b)(1) of the Act, 21 U.S.C. section 360bbb-3(b)(1), unless the authorization is terminated or revoked.  Performed at [facility], [address], [place], [place] [address]   Resp Panel by RT-PCR (Flu A&B, Covid) Nasopharyngeal Swab     Status: None   Collection Time: 04/17/21  9:40 AM   Specimen: Nasopharyngeal Swab; Nasopharyngeal(NP) swabs in vial transport medium  Result Value Ref Range Status   SARS Coronavirus 2 by RT PCR NEGATIVE NEGATIVE Final    Comment: (NOTE) SARS-CoV-2 target nucleic acids are NOT DETECTED.  The SARS-CoV-2 RNA is generally detectable in upper respiratory specimens during the acute phase of infection. The lowest concentration of SARS-CoV-2 viral copies this assay can detect is 138 copies/mL. A negative result does not preclude SARS-Cov-2 infection and should not be used as the sole basis for treatment or other patient management decisions. A negative result may occur with  improper specimen collection/handling, submission of specimen other than nasopharyngeal swab, presence of viral mutation(s) within the areas targeted by this assay, and inadequate number of viral copies(<138 copies/mL). A negative result must be  combined with clinical observations, patient history, and epidemiological information. The expected result is Negative.  Fact Sheet for Patients:  BloggerCourse.com  Fact Sheet for Healthcare Providers:  SeriousBroker.it  This test is no t yet approved or cleared by the [place] FDA and  has been authorized for detection and/or diagnosis of SARS-CoV-2 by FDA under an Emergency Use Authorization (EUA). This EUA will remain  in effect (meaning this test can be used) for the duration of the COVID-19 declaration under Section 564(b)(1) of the Act, 21 U.S.C.section 360bbb-3(b)(1), unless the authorization is terminated  or revoked sooner.       Influenza A by PCR NEGATIVE NEGATIVE Final   Influenza B by PCR NEGATIVE NEGATIVE Final    Comment: (NOTE) The Xpert Xpress SARS-CoV-2/FLU/RSV plus assay is intended as an aid in the diagnosis of influenza from Nasopharyngeal swab specimens and should not be used as a sole basis for treatment. Nasal washings and aspirates are unacceptable for Xpert Xpress SARS-CoV-2/FLU/RSV testing.  Fact Sheet for Patients: BloggerCourse.com  Fact Sheet for Healthcare Providers: SeriousBroker.it  This test is not yet approved or cleared by the [place] FDA and has been authorized for detection and/or diagnosis of SARS-CoV-2 by FDA under an Emergency Use Authorization (EUA). This EUA will remain in effect (meaning this test can be used) for the duration of the COVID-19 declaration under Section 564(b)(1) of the Act, 21 U.S.C. section 360bbb-3(b)(1), unless the authorization is terminated or revoked.  Performed at [facility], [address], [place], [place] [address]   Aerobic/Anaerobic Culture w Gram Stain (surgical/deep wound)     Status: None (Preliminary result)   Collection Time: 04/18/21  2:22 PM   Specimen: Soft Tissue, Other   Result Value Ref Range Status   Specimen Description TISSUE RIGHT FOOT  Final   Special Requests FROM ULER ABSCESS  Final   Gram Stain   Final    NO WBC SEEN NO ORGANISMS SEEN Performed at [facility], [address] [place], [place] [address]    Culture   Final    RARE PROTEUS MIRABILIS NO ANAEROBES ISOLATED; CULTURE IN PROGRESS FOR 5 DAYS    Report Status PENDING  Incomplete   Organism ID, Bacteria PROTEUS MIRABILIS  Final      Susceptibility   Proteus mirabilis - MIC*    AMPICILLIN <=2 SENSITIVE Sensitive     CEFAZOLIN <=4 SENSITIVE Sensitive     CEFEPIME <=0.12 SENSITIVE Sensitive     CEFTAZIDIME <=1 SENSITIVE Sensitive     CEFTRIAXONE <=0.25 SENSITIVE Sensitive     CIPROFLOXACIN <=0.25 SENSITIVE Sensitive     GENTAMICIN <=1 SENSITIVE Sensitive     IMIPENEM 2 SENSITIVE Sensitive     TRIMETH/SULFA <=20 SENSITIVE Sensitive     AMPICILLIN/SULBACTAM <=2 SENSITIVE Sensitive     PIP/TAZO <=4 SENSITIVE Sensitive     * RARE PROTEUS MIRABILIS  Surgical pcr screen     Status: Abnormal   Collection Time: 04/20/21  2:22 AM   Specimen: Nasal Mucosa; Nasal Swab  Result Value Ref Range Status   MRSA, PCR NEGATIVE NEGATIVE Final   Staphylococcus aureus POSITIVE (A) NEGATIVE Final    Comment: (NOTE) The Xpert SA Assay (FDA approved for NASAL specimens in patients 76 years of age and older), is one component of a comprehensive surveillance program. It is not intended to diagnose infection nor to guide or monitor treatment. Performed at [facility], [address], [place], [place] [address]       Radiology Studies: No results found.  Scheduled Meds:  vitamin C  1,000 mg Oral Daily   atorvastatin  40 mg Oral Daily   cyanocobalamin  1,000 mcg Intramuscular Once   docusate sodium  100 mg Oral BID   folic acid  1 mg Oral Daily   insulin aspart  0-9 Units Subcutaneous TID WC   insulin glargine  10 Units Subcutaneous Daily   losartan  25 mg Oral Daily    multivitamin with minerals  1 tablet Oral Daily   mupirocin ointment  1 application Nasal BID   nutrition supplement (JUVEN)  1 packet Oral BID BM   sodium chloride flush  3 mL Intravenous Q12H   thiamine  100 mg Oral Daily   Continuous Infusions:  sodium chloride     ceFEPime (MAXIPIME) IV 2 g (04/21/21 0529)   vancomycin 1,500 mg (04/19/21 1550)     LOS: 4 days   Time spent: 35 minutes.  Tyrone Nine, MD [facility] [email or web address] 04/21/2021, 2:58 PM

## 2021-04-21 NOTE — Progress Notes (Signed)
Patient ID: Jesse [name], male   DOB: [birth year], 60 y.o.   MRN: [id number] Patient is postoperative day 1 skin graft to a massive wound plantar aspect right foot.  There is 100 cc in the wound VAC canister with 1 check.  Anticipate discharge to home on oral antibiotics on Monday.  Current cultures for Proteus are pansensitive.

## 2021-04-21 NOTE — Progress Notes (Signed)
Orthopedic Tech Progress Note Patient Details:  Jesse [name] [birth year] [id number]  Ortho Devices Type of Ortho Device: Postop shoe/boot Ortho Device/Splint Interventions: Ordered      Jesse [name] A Jesse [name] 04/21/2021, 9:56 AM

## 2021-04-22 LAB — GLUCOSE, CAPILLARY
Glucose-Capillary: 134 mg/dL — ABNORMAL HIGH (ref 70–99)
Glucose-Capillary: 169 mg/dL — ABNORMAL HIGH (ref 70–99)
Glucose-Capillary: 191 mg/dL — ABNORMAL HIGH (ref 70–99)
Glucose-Capillary: 166 mg/dL — ABNORMAL HIGH (ref 70–99)

## 2021-04-22 LAB — BASIC METABOLIC PANEL
Anion gap: 8 (ref 5–15)
BUN: 31 mg/dL — ABNORMAL HIGH (ref 6–20)
CO2: 21 mmol/L — ABNORMAL LOW (ref 22–32)
Calcium: 8.9 mg/dL (ref 8.9–10.3)
Chloride: 106 mmol/L (ref 98–111)
Creatinine, Ser: 1.16 mg/dL (ref 0.61–1.24)
GFR, Estimated: >60 mL/min (ref >60)
Glucose, Bld: 154 mg/dL — ABNORMAL HIGH (ref 70–99)
Potassium: 4.3 mmol/L (ref 3.5–5.1)
Sodium: 135 mmol/L (ref 135–145)

## 2021-04-22 LAB — CBC
HCT: 30 % — ABNORMAL LOW (ref 39.0–52.0)
Hemoglobin: 9.9 g/dL — ABNORMAL LOW (ref 13.0–17.0)
MCH: 31.4 pg (ref 26.0–34.0)
MCHC: 33 g/dL (ref 30.0–36.0)
MCV: 95.2 fL (ref 80.0–100.0)
Platelets: 265 10*3/uL (ref 150–400)
RBC: 3.15 MIL/uL — ABNORMAL LOW (ref 4.22–5.81)
RDW: 13 % (ref 11.5–15.5)
WBC: 9 10*3/uL (ref 4.0–10.5)
nRBC: 0 % (ref 0.0–0.2)

## 2021-04-22 NOTE — Progress Notes (Signed)
PROGRESS NOTE  Jesse [name]  WSF:[id number] DOB: [birth year] DOA: 04/17/2021 PCP: Oneita Hurt, No   Brief Narrative: Jesse [name] is a 60 y.o. male with a longstanding history of poorly controlled diabetes, HTN, HLD, peripheral insensate diabetic neuropathy, and alcohol use who presented to the ED 6/7 with continued/worsening right foot wound despite taking antibiotics. He was admitted twice before (on 5/29 and again 6/4 - 6/5) for this problem but left AMA prior to definitive treatment. He was afebrile, WBC 10.7k, hemoglobin 12.1, BUN 30, creatinine 1.54, and glucose 230. Vancomycin, cefepime started, and orthopedics consulted, took for I&D on 6/8 and 6/10.   Assessment & Plan: Principal Problem:   Diabetic foot infection (HCC) Active Problems:   DM (diabetes mellitus), type 2, uncontrolled (HCC)   Leukocytosis   HTN (hypertension)   HLD (hyperlipidemia)   Cellulitis of right foot  Infected diabetic right foot ulcer with cellulitis, suspected abscess due to Proteus mirabilis: Based on wound culture from 6/8, pan-sensitive.  - NKDA. Will narrow abx to ampicillin while inpatient with transition to amoxicillin or similar at discharge. Blood cultures from 6/4 negative.  - s/p I&D 6/8, I&D with skin graft to massive wound on plantar aspect of right foot on 6/10 by Dr. Lajoyce Corners.  Continue wound vac per orthopedics. Will likely DC after their reevaluation 6/13.   Uncontrolled T2DM with peripheral neuropathy: HbA1c 10.9% on 04/14/21, poor adherence to medication regimen.  - SSI, lantus 10u daily (prescribed at DC). Postprandials remain elevated, add low dose mealtime novolog starting this PM.  - Carb modified diet  - Diabetes coordinator consulted. We've ordered and will provide the full host of diabetic supplies needed prior to his discharge. Pt has received intensive education and expresses firm understanding.   Diabetic polyneuropathy:  - With borderline B12 (level 197 with LLN 180) and severe  neuropathy, possible ongoing EtOH use, B12 supplement was ordered but declined.  HTN:  - Continue losartan (will provide Rx at DC)  HLD:  - Continue statin (will provide at DC)  AKI: Prerenal  - Improved. CrCl >60ml/min consistently.   Hyperkalemia: Resolved.  Alcohol abuse: Excessive daily drinking reported.  - DC CIWA. No withdrawal noted.  - Moderation counseling to be provided prior to discharge.   Right thumb swelling: Suspected to be traumatic with neuropathy. No current or previous history of erythema/swelling suggestive of gout or septic joint. No bony abnormalities on XR on my personal review.  - Supportive care.   Acute blood loss anemia:  - Recheck H/H in AM.   Obesity: Estimated body mass index is 31.87 kg/m as calculated from the following:   Height as of this encounter: 6' (1.829 m).   Weight as of this encounter: 106.6 kg.  DVT prophylaxis: Lovenox Code Status: Full Family Communication: None at bedside Disposition Plan:  Status is: Inpatient  Remains inpatient appropriate because:Inpatient level of care appropriate due to severity of illness  Dispo: The patient is from: Home              Anticipated d/c is to: Home 6/13              Patient currently is not medically stable to d/c.   Difficult to place patient No  Consultants:  Orthopedics, Dr. Lajoyce Corners  Procedures:  04/18/2021 Dr. Lajoyce Corners: Excisional DEBRIDEMENT OF RIGHT FOOT. Application of cleanse choice wound VAC sponges x2. 04/20/2021 Dr. Lajoyce Corners: EXCISIONAL DEBRIDEMENT RIGHT FOOT, APPLICATION OF KERECIS AND ACELL SKIN GRAFT. Application of cleanse choice wound VAC sponge  Antimicrobials: Vancomycin, cefepime 6/7 - 6/11 Ampicillin 6/11 >>  Subjective: No complaints today. Pain controlled. CBGs improved. No hypoglycemia. Denies bleeding, lightheadedness, etc.   Objective: Vitals:   04/21/21 1953 04/22/21 0448 04/22/21 0748 04/22/21 1514  BP: (!) 121/56 123/75 114/81 113/72  Pulse: 79 64 72 70  Resp:  17 18 18 18   Temp: 98.2 F (36.8 C) (!) 97.3 F (36.3 C) (!) 97.5 F (36.4 C) 98.1 F (36.7 C)  TempSrc: Oral Oral Oral Oral  SpO2: 100% 100% 100% 100%  Weight:      Height:        Intake/Output Summary (Last 24 hours) at 04/22/2021 1709 Last data filed at 04/22/2021 1400 Gross per 24 hour  Intake 1115.9 ml  Output 1425 ml  Net -309.1 ml   Filed Weights   04/17/21 1526  Weight: 106.6 kg   Gen: 60 y.o. male in no distress Pulm: Nonlabored breathing room air. Clear. CV: Regular rate and rhythm. No murmur, rub, or gallop. No JVD, no dependent edema. GI: Abdomen soft, non-tender, non-distended, with normoactive bowel sounds.  Ext: Warm, no deformities Skin: No new rashes, lesions or ulcers on visualized skin. Neuro: Alert and oriented. No focal neurological deficits. Psych: Judgement and insight appear fair. Mood euthymic & affect congruent. Behavior is appropriate.    Data Reviewed: I have personally reviewed following labs and imaging studies  CBC: Recent Labs  Lab 04/17/21 0959 04/18/21 0850 04/19/21 0104 04/20/21 0139 04/21/21 0044 04/22/21 0213  WBC 10.7* 8.1 14.8* 9.1 9.1 9.0  NEUTROABS 6.8  --   --   --   --   --   HGB 12.1* 13.2 11.3* 10.9* 10.7* 9.9*  HCT 37.4* 40.4 34.0* 33.1* 31.9* 30.0*  MCV 97.7 97.1 95.2 95.7 96.1 95.2  PLT 365 322 309 278 274 265   Basic Metabolic Panel: Recent Labs  Lab 04/18/21 0500 04/18/21 0850 04/19/21 0104 04/20/21 0139 04/21/21 0044 04/22/21 0213  NA 137  --  135 136 135 135  K 4.9  --  5.2* 4.6 5.0 4.3  CL 105  --  106 107 106 106  CO2 22  --  23 21* 20* 21*  GLUCOSE 195*  --  175* 208* 199* 154*  BUN 22*  --  22* 24* 23* 31*  CREATININE 1.29*  --  1.30* 1.32* 1.17 1.16  CALCIUM 9.5  --  8.9 9.1 8.9 8.9  MG  --  1.8  --   --   --   --   PHOS  --  3.7  --   --   --   --    Liver Function Tests: Recent Labs  Lab 04/18/21 0500  AST 21  ALT 27  ALKPHOS 94  BILITOT 0.3  PROT 7.8  ALBUMIN 2.9*   CBG: Recent  Labs  Lab 04/21/21 1754 04/21/21 2113 04/22/21 0750 04/22/21 1139 04/22/21 1642  GLUCAP 262* 219* 134* 169* 191*   Anemia Panel: Recent Labs    04/20/21 0139  VITAMINB12 197  FOLATE 10.5  FERRITIN 820*  TIBC 214*  IRON 46   Urine analysis:    Component Value Date/Time   COLORURINE YELLOW 02/07/2021 1521   APPEARANCEUR CLEAR 02/07/2021 1521   LABSPEC 1.014 02/07/2021 1521   PHURINE 5.0 02/07/2021 1521   GLUCOSEU 150 (A) 02/07/2021 1521   HGBUR SMALL (A) 02/07/2021 1521   BILIRUBINUR NEGATIVE 02/07/2021 1521   KETONESUR NEGATIVE 02/07/2021 1521   PROTEINUR NEGATIVE 02/07/2021 1521   NITRITE NEGATIVE  02/07/2021 1521   LEUKOCYTESUR NEGATIVE 02/07/2021 1521   Recent Results (from the past 240 hour(s))  Culture, blood (routine x 2)     Status: None   Collection Time: 04/14/21  4:30 PM   Specimen: Right Antecubital; Blood  Result Value Ref Range Status   Specimen Description RIGHT ANTECUBITAL  Final   Special Requests   Final    BOTTLES DRAWN AEROBIC AND ANAEROBIC Blood Culture adequate volume   Culture   Final    NO GROWTH 5 DAYS Performed at [facility], [address], [place], [place] [address]    Report Status 04/19/2021 FINAL  Final  Resp Panel by RT-PCR (Flu A&B, Covid) Nasopharyngeal Swab     Status: None   Collection Time: 04/14/21  4:30 PM   Specimen: Nasopharyngeal Swab; Nasopharyngeal(NP) swabs in vial transport medium  Result Value Ref Range Status   SARS Coronavirus 2 by RT PCR NEGATIVE NEGATIVE Final    Comment: (NOTE) SARS-CoV-2 target nucleic acids are NOT DETECTED.  The SARS-CoV-2 RNA is generally detectable in upper respiratory specimens during the acute phase of infection. The lowest concentration of SARS-CoV-2 viral copies this assay can detect is 138 copies/mL. A negative result does not preclude SARS-Cov-2 infection and should not be used as the sole basis for treatment or other patient management decisions. A negative result may  occur with  improper specimen collection/handling, submission of specimen other than nasopharyngeal swab, presence of viral mutation(s) within the areas targeted by this assay, and inadequate number of viral copies(<138 copies/mL). A negative result must be combined with clinical observations, patient history, and epidemiological information. The expected result is Negative.  Fact Sheet for Patients:  BloggerCourse.com  Fact Sheet for Healthcare Providers:  SeriousBroker.it  This test is no t yet approved or cleared by the [place] FDA and  has been authorized for detection and/or diagnosis of SARS-CoV-2 by FDA under an Emergency Use Authorization (EUA). This EUA will remain  in effect (meaning this test can be used) for the duration of the COVID-19 declaration under Section 564(b)(1) of the Act, 21 U.S.C.section 360bbb-3(b)(1), unless the authorization is terminated  or revoked sooner.       Influenza A by PCR NEGATIVE NEGATIVE Final   Influenza B by PCR NEGATIVE NEGATIVE Final    Comment: (NOTE) The Xpert Xpress SARS-CoV-2/FLU/RSV plus assay is intended as an aid in the diagnosis of influenza from Nasopharyngeal swab specimens and should not be used as a sole basis for treatment. Nasal washings and aspirates are unacceptable for Xpert Xpress SARS-CoV-2/FLU/RSV testing.  Fact Sheet for Patients: BloggerCourse.com  Fact Sheet for Healthcare Providers: SeriousBroker.it  This test is not yet approved or cleared by the [place] FDA and has been authorized for detection and/or diagnosis of SARS-CoV-2 by FDA under an Emergency Use Authorization (EUA). This EUA will remain in effect (meaning this test can be used) for the duration of the COVID-19 declaration under Section 564(b)(1) of the Act, 21 U.S.C. section 360bbb-3(b)(1), unless the authorization is terminated  or revoked.  Performed at [facility], [address] [place], [place] [address]   Resp Panel by RT-PCR (Flu A&B, Covid) Nasopharyngeal Swab     Status: None   Collection Time: 04/17/21  9:40 AM   Specimen: Nasopharyngeal Swab; Nasopharyngeal(NP) swabs in vial transport medium  Result Value Ref Range Status   SARS Coronavirus 2 by RT PCR NEGATIVE NEGATIVE Final    Comment: (NOTE) SARS-CoV-2 target nucleic acids are  NOT DETECTED.  The SARS-CoV-2 RNA is generally detectable in upper respiratory specimens during the acute phase of infection. The lowest concentration of SARS-CoV-2 viral copies this assay can detect is 138 copies/mL. A negative result does not preclude SARS-Cov-2 infection and should not be used as the sole basis for treatment or other patient management decisions. A negative result may occur with  improper specimen collection/handling, submission of specimen other than nasopharyngeal swab, presence of viral mutation(s) within the areas targeted by this assay, and inadequate number of viral copies(<138 copies/mL). A negative result must be combined with clinical observations, patient history, and epidemiological information. The expected result is Negative.  Fact Sheet for Patients:  BloggerCourse.comhttps://www.fda.gov/media/152166/download  Fact Sheet for Healthcare Providers:  SeriousBroker.ithttps://www.fda.gov/media/152162/download  This test is no t yet approved or cleared by the [place] FDA and  has been authorized for detection and/or diagnosis of SARS-CoV-2 by FDA under an Emergency Use Authorization (EUA). This EUA will remain  in effect (meaning this test can be used) for the duration of the COVID-19 declaration under Section 564(b)(1) of the Act, 21 U.S.C.section 360bbb-3(b)(1), unless the authorization is terminated  or revoked sooner.       Influenza A by PCR NEGATIVE NEGATIVE Final   Influenza B by PCR NEGATIVE NEGATIVE Final    Comment: (NOTE) The Xpert Xpress  SARS-CoV-2/FLU/RSV plus assay is intended as an aid in the diagnosis of influenza from Nasopharyngeal swab specimens and should not be used as a sole basis for treatment. Nasal washings and aspirates are unacceptable for Xpert Xpress SARS-CoV-2/FLU/RSV testing.  Fact Sheet for Patients: BloggerCourse.comhttps://www.fda.gov/media/152166/download  Fact Sheet for Healthcare Providers: SeriousBroker.ithttps://www.fda.gov/media/152162/download  This test is not yet approved or cleared by the [place] FDA and has been authorized for detection and/or diagnosis of SARS-CoV-2 by FDA under an Emergency Use Authorization (EUA). This EUA will remain in effect (meaning this test can be used) for the duration of the COVID-19 declaration under Section 564(b)(1) of the Act, 21 U.S.C. section 360bbb-3(b)(1), unless the authorization is terminated or revoked.  Performed at [facility], [address] [place], [place] [address]   Aerobic/Anaerobic Culture w Gram Stain (surgical/deep wound)     Status: None (Preliminary result)   Collection Time: 04/18/21  2:22 PM   Specimen: Soft Tissue, Other  Result Value Ref Range Status   Specimen Description TISSUE RIGHT FOOT  Final   Special Requests FROM ULER ABSCESS  Final   Gram Stain   Final    NO WBC SEEN NO ORGANISMS SEEN Performed at [facility], [address] [place], [place] [address]    Culture   Final    RARE PROTEUS MIRABILIS WITHIN MIXED ORGANISMS NO ANAEROBES ISOLATED; CULTURE IN PROGRESS FOR 5 DAYS    Report Status PENDING  Incomplete   Organism ID, Bacteria PROTEUS MIRABILIS  Final      Susceptibility   Proteus mirabilis - MIC*    AMPICILLIN <=2 SENSITIVE Sensitive     CEFAZOLIN <=4 SENSITIVE Sensitive     CEFEPIME <=0.12 SENSITIVE Sensitive     CEFTAZIDIME <=1 SENSITIVE Sensitive     CEFTRIAXONE <=0.25 SENSITIVE Sensitive     CIPROFLOXACIN <=0.25 SENSITIVE Sensitive     GENTAMICIN <=1 SENSITIVE Sensitive     IMIPENEM 2 SENSITIVE Sensitive      TRIMETH/SULFA <=20 SENSITIVE Sensitive     AMPICILLIN/SULBACTAM <=2 SENSITIVE Sensitive     PIP/TAZO <=4 SENSITIVE Sensitive     * RARE PROTEUS MIRABILIS  Surgical pcr  screen     Status: Abnormal   Collection Time: 04/20/21  2:22 AM   Specimen: Nasal Mucosa; Nasal Swab  Result Value Ref Range Status   MRSA, PCR NEGATIVE NEGATIVE Final   Staphylococcus aureus POSITIVE (A) NEGATIVE Final    Comment: (NOTE) The Xpert SA Assay (FDA approved for NASAL specimens in patients 20 years of age and older), is one component of a comprehensive surveillance program. It is not intended to diagnose infection nor to guide or monitor treatment. Performed at [facility], [address] [place], [place] [address]       Radiology Studies: No results found.  Scheduled Meds:  vitamin C  1,000 mg Oral Daily   atorvastatin  40 mg Oral Daily   docusate sodium  100 mg Oral BID   folic acid  1 mg Oral Daily   insulin aspart  0-9 Units Subcutaneous TID WC   insulin aspart  2 Units Subcutaneous TID WC   insulin glargine  10 Units Subcutaneous Daily   losartan  25 mg Oral Daily   multivitamin with minerals  1 tablet Oral Daily   mupirocin ointment  1 application Nasal BID   nutrition supplement (JUVEN)  1 packet Oral BID BM   sodium chloride flush  3 mL Intravenous Q12H   thiamine  100 mg Oral Daily   Continuous Infusions:  sodium chloride     ceFEPime (MAXIPIME) IV 2 g (04/22/21 1332)   vancomycin Stopped (04/21/21 1825)     LOS: 5 days   Time spent: 25 minutes.  Tyrone Nine, MD [facility] [email or web address] 04/22/2021, 5:09 PM

## 2021-04-22 NOTE — Progress Notes (Signed)
Inpatient Rehab Admissions Coordinator:   PT recommends OP, OT recommends HH. Pt. Does not demonstrate need for CIR level therapies. I will not pursue CIR admit. CIR to s/o.  Megan Salon, MS, CCC-SLP Rehab Admissions Coordinator  [phone] (celll) [phone] (office)

## 2021-04-22 NOTE — Plan of Care (Signed)
  Problem: Clinical Measurements: Goal: Postoperative complications will be avoided or minimized Outcome: Progressing   Problem: Education: Goal: Knowledge of General Education information will improve Description: Including pain rating scale, medication(s)/side effects and non-pharmacologic comfort measures Outcome: Progressing   Problem: Health Behavior/Discharge Planning: Goal: Ability to manage health-related needs will improve Outcome: Progressing   Problem: Clinical Measurements: Goal: Respiratory complications will improve Outcome: Progressing   Problem: Activity: Goal: Risk for activity intolerance will decrease Outcome: Progressing   Problem: Nutrition: Goal: Adequate nutrition will be maintained Outcome: Progressing   Problem: Pain Managment: Goal: General experience of comfort will improve Outcome: Progressing   Problem: Safety: Goal: Ability to remain free from injury will improve Outcome: Progressing   Problem: Skin Integrity: Goal: Risk for impaired skin integrity will decrease Outcome: Progressing

## 2021-04-23 ENCOUNTER — Other Ambulatory Visit (HOSPITAL_COMMUNITY): Payer: Self-pay

## 2021-04-23 ENCOUNTER — Encounter (HOSPITAL_COMMUNITY): Payer: Self-pay | Admitting: Orthopedic Surgery

## 2021-04-23 LAB — GLUCOSE, CAPILLARY
Glucose-Capillary: 171 mg/dL — ABNORMAL HIGH (ref 70–99)
Glucose-Capillary: 231 mg/dL — ABNORMAL HIGH (ref 70–99)

## 2021-04-23 LAB — AEROBIC/ANAEROBIC CULTURE W GRAM STAIN (SURGICAL/DEEP WOUND): Gram Stain: NONE SEEN

## 2021-04-23 LAB — HEMOGLOBIN AND HEMATOCRIT, BLOOD
HCT: 30.4 % — ABNORMAL LOW (ref 39.0–52.0)
Hemoglobin: 10 g/dL — ABNORMAL LOW (ref 13.0–17.0)

## 2021-04-23 MED ORDER — AMOXICILLIN-POT CLAVULANATE 875-125 MG PO TABS: 1.0000 | ORAL_TABLET | Freq: Two times a day (BID) | ORAL | Status: DC

## 2021-04-23 MED ORDER — AMOXICILLIN-POT CLAVULANATE 875-125 MG PO TABS
1.0000 | ORAL_TABLET | Freq: Two times a day (BID) | ORAL | 0 refills | Status: DC
  Filled 2021-04-23: qty 20, 10d supply, fill #0

## 2021-04-23 NOTE — Care Management (Addendum)
Called Jasmine with [employer] , needed walker to DME orders. Ronny Flurry RN   [phone] [employer] will come to patient's room and discuss wheel chair and walker

## 2021-04-23 NOTE — Progress Notes (Signed)
Patient ID: Jesse [name], male   DOB: [birth year], 60 y.o.   MRN: [id number] Patient is a 60 year old gentleman who is status post skin graft large abscess plantar aspect of the right foot.  Cultures were pansensitive.  There is 200 cc of clear serosanguineous fluid in the wound VAC canister.  Anticipate patient can discharge to home once he is safe with his glucose management and able to ambulate without weightbearing on the right.  Anticipate discharge on antibiotics.  Patient will discharge with a portable Praveena wound VAC pump for 1 week.

## 2021-04-23 NOTE — Progress Notes (Signed)
OT Cancellation Note  Patient Details Name: Jesse [name] MRN: [id number] DOB: Jul 01, 1961   Cancelled Treatment:    Reason Eval/Treat Not Completed: Medical issues which prohibited therapy (Upon arrival, pt wtih RN and LPN addressing wound vac. Will f/u as time permits. D/c pending this afternoon or tomorrow.)  [name] 04/23/2021, 12:01 PM

## 2021-04-23 NOTE — Progress Notes (Signed)
Hooked up Prevena to charge and began education with pt.

## 2021-04-23 NOTE — Progress Notes (Signed)
Occupational Therapy Treatment Patient Details Name: Jesse [name] MRN: [id number] DOB: [birth year] Today's Date: 04/23/2021    History of present illness Pt is a 60 y.o. male who presented 6/7 with worsening R foot infection. Per chart, he was diagnosed with DM2 and R foot cellulitis 3/30 and has since stepped on a nail and has left AMA for the same concern at least 2x. Pt with poor medication compliance and understanding. S/p excisional debridement of R foot and application of wound vac 6/8. Repeat excisional debridement and placement of skin graft R foot 6/10. PMH: hypertension, hyperlipidemia, diabetes mellitus type 2, and alcohol abuse.   OT comments  [name] is progressing towards his goals with plans to d/c home today. [name] completed ADLs at a min A level and required verbal cues to sequence through LB dressing and management of wound vac. Pt also required assistance to maintain WB status throughout sit<>stand transfers with rw during ADLs. Verbally reviewed tub transfer, pt verbalized understanding. Pt could benefit from continued OT acutely should he stay acutely. D/c recommendation remains appropriate.    Follow Up Recommendations  Home health OT;Supervision - Intermittent    Equipment Recommendations  3 in 1 bedside commode;Tub/shower bench       Precautions / Restrictions Precautions Precautions: Fall Precaution Comments: R LE wound vac Restrictions Weight Bearing Restrictions: Yes RLE Weight Bearing: Non weight bearing       Mobility Bed Mobility Overal bed mobility: Needs Assistance             General bed mobility comments: pt in chair upon arrival    Transfers Overall transfer level: Needs assistance Equipment used: Rolling walker (2 wheeled) Transfers: Sit to/from Stand Sit to Stand: Min guard         General transfer comment: verbal cues for hand placement    Balance Overall balance assessment: Needs assistance Sitting-balance support: No  upper extremity supported Sitting balance-Leahy Scale: Good     Standing balance support: Single extremity supported;During functional activity Standing balance-Leahy Scale: Fair Standing balance comment: Pt able to pull pants over hips in standing with one hand supported externaly               ADL either performed or assessed with clinical judgement   ADL Overall ADL's : Needs assistance/impaired                 Upper Body Dressing : Set up;Sitting   Lower Body Dressing: Minimal assistance;Sit to/from stand Lower Body Dressing Details (indicate cue type and reason): Pt required vc fro sequencing LB dressing and min A to manage wound vac through pant leg; pt attempted to plave shorts on wrong several times             Functional mobility during ADLs: Min guard;Rolling walker;Cueing for sequencing General ADL Comments: pt required vc to maintain WB status during sit<>stands for ADLs      Cognition Arousal/Alertness: Awake/alert Behavior During Therapy: WFL for tasks assessed/performed Overall Cognitive Status: No family/caregiver present to determine baseline cognitive functioning Area of Impairment: Memory;Safety/judgement;Awareness;Problem solving                 Orientation Level: Disoriented to;Time   Memory: Decreased short-term memory   Safety/Judgement: Decreased awareness of safety;Decreased awareness of deficits Awareness: Emergent Problem Solving: Decreased initiation;Difficulty sequencing;Requires verbal cues;Requires tactile cues;Slow processing General Comments: Pt reports he is going home with tub shower, and his cousin will provide assistance as needed  General Comments Verbally and visually reviewed tub bench transfer. Educated on lowerbody dressing with wound vac.    Pertinent Vitals/ Pain       Pain Assessment: Faces Pain Score: 0-No pain Pain Intervention(s): Monitored during session   Frequency  Min 2X/week         Progress Toward Goals  OT Goals(current goals can now be found in the care plan section)  Progress towards OT goals: Progressing toward goals  Acute Rehab OT Goals Patient Stated Goal: home soon OT Goal Formulation: With patient Time For Goal Achievement: 05/03/21 Potential to Achieve Goals: Good ADL Goals Pt Will Perform Lower Body Dressing: with modified independence;sit to/from stand Pt Will Transfer to Toilet: with modified independence;ambulating;regular height toilet Pt Will Perform Toileting - Clothing Manipulation and hygiene: with modified independence;sit to/from stand Pt Will Perform Tub/Shower Transfer: Tub transfer;with supervision;ambulating;shower seat  Plan Discharge plan remains appropriate       AM-PAC OT "6 Clicks" Daily Activity     Outcome Measure   Help from another person eating meals?: None Help from another person taking care of personal grooming?: A Little Help from another person toileting, which includes using toliet, bedpan, or urinal?: A Little Help from another person bathing (including washing, rinsing, drying)?: A Little Help from another person to put on and taking off regular upper body clothing?: A Little Help from another person to put on and taking off regular lower body clothing?: A Little 6 Click Score: 19    End of Session Equipment Utilized During Treatment: Rolling walker  OT Visit Diagnosis: Unsteadiness on feet (R26.81);Muscle weakness (generalized) (M62.81);Pain;Other symptoms and signs involving cognitive function Pain - Right/Left: Right Pain - part of body: Ankle and joints of foot   Activity Tolerance Patient tolerated treatment well   Patient Left in chair;with call bell/phone within reach;with chair alarm set   Nurse Communication Mobility status        Time: 1354-1410 OT Time Calculation (min): 16 min  Charges: OT General Charges $OT Visit: 1 Visit OT Treatments $Self Care/Home Management : 8-22  mins    Sidney Silberman A Sindia Kowalczyk 04/23/2021, 3:59 PM

## 2021-04-23 NOTE — Progress Notes (Signed)
Pt education completed regarding insulin and checking blood glucoses at home and to keep a record to take to the dr. [name] set up and pt understands to keep it on for 1 week until returns to Dr. Lajoyce Corners.  Pt understands to keep it charged up and operating. Pts meds returned to pt. From pharmacy. Follow up phone #s given to pt.

## 2021-04-23 NOTE — Evaluation (Signed)
Physical Therapy Evaluation Patient Details Name: Jesse [name] MRN: [id number] DOB: 1961-09-25 Today's Date: 04/23/2021   History of Present Illness  Pt is a 59 y.o. male who presented 6/7 with worsening R foot infection. Per chart, he was diagnosed with DM2 and R foot cellulitis 3/30 and has since stepped on a nail and has left AMA for the same concern at least 2x. Pt with poor medication compliance and understanding. S/p excisional debridement of R foot and application of wound vac 6/8. Repeat excisional debridement and placement of skin graft R foot 6/10. PMH: hypertension, hyperlipidemia, diabetes mellitus type 2, and alcohol abuse.   Clinical Impression  Pt progressing well towards all goals however pt with inconsistent report of PLOF and home set up. Pt states he lives alone but his cousin will take him to his appts or grocery shopping. Complete stair negotiation with RW backwards and forwards to mimic platform step. Pt may benefit from use of w/c for long distance mobility. Acute PT to cont to follow.    Follow Up Recommendations Outpatient PT;Supervision for mobility/OOB (pt doesn't drive and lives alone, unsure he will be able to get to OP appt, may benefit from HHPT)    Equipment Recommendations  3in1 (PT);Rolling walker with 5" wheels;Wheelchair (measurements PT);Wheelchair cushion (measurements PT)    Recommendations for Other Services       Precautions / Restrictions Precautions Precautions: Fall Precaution Comments: R LE wound vac (pt leaking from wound vac) Restrictions Weight Bearing Restrictions: Yes RLE Weight Bearing: Non weight bearing      Mobility  Bed Mobility               General bed mobility comments: pt up in chair upon PT arrival    Transfers Overall transfer level: Needs assistance Equipment used: Rolling walker (2 wheeled) Transfers: Sit to/from Stand Sit to Stand: Min guard         General transfer comment: verbal cues for hand  placement  Ambulation/Gait Ambulation/Gait assistance: Min guard Gait Distance (Feet): 40 Feet Assistive device: Rolling walker (2 wheeled);Crutches Gait Pattern/deviations: Step-to pattern Gait velocity: slow Gait velocity interpretation: <1.31 ft/sec, indicative of household ambulator General Gait Details: hop to pattern, compliant with R LE NWB 100% of time, 2 standing rest breaks  Stairs Stairs: Yes Stairs assistance: Min assist Stair Management: Step to pattern;Backwards;With walker Number of Stairs: 1 (1 platform step to mimic home set up, x2 trials) General stair comments: pt educated on using RW backwards to ascend platform step and forward descending step, pt requiring max directional verbal cues, completed 2 trials  Wheelchair Mobility    Modified Rankin (Stroke Patients Only)       Balance Overall balance assessment: Needs assistance Sitting-balance support: No upper extremity supported Sitting balance-Leahy Scale: Good     Standing balance support: Bilateral upper extremity supported Standing balance-Leahy Scale: Poor Standing balance comment: dependent on RW                             Pertinent Vitals/Pain Pain Assessment: 0-10 Pain Score: 6  Pain Location: R foot Pain Descriptors / Indicators: Sore Pain Intervention(s): Monitored during session    Home Living                        Prior Function                 Hand Dominance  Extremity/Trunk Assessment                Communication      Cognition Arousal/Alertness: Awake/alert Behavior During Therapy: WFL for tasks assessed/performed Overall Cognitive Status: No family/caregiver present to determine baseline cognitive functioning Area of Impairment: Memory;Safety/judgement;Awareness;Problem solving                     Memory: Decreased short-term memory   Safety/Judgement: Decreased awareness of safety;Decreased awareness of  deficits Awareness: Emergent Problem Solving: Decreased initiation;Difficulty sequencing;Requires verbal cues;Requires tactile cues;Slow processing General Comments: pt with different report when asked PLOF and home set up compared to eval. unsure of patients baseline, pt requiring increased time and max verbal cues to complete stair negotiation      General Comments General comments (skin integrity, edema, etc.): Noted drainage/leaking from wound vac dressing at heel    Exercises     Assessment/Plan    PT Assessment    PT Problem List         PT Treatment Interventions      PT Goals (Current goals can be found in the Care Plan section)       Frequency Min 3X/week   Barriers to discharge        Co-evaluation               AM-PAC PT "6 Clicks" Mobility  Outcome Measure Help needed turning from your back to your side while in a flat bed without using bedrails?: None Help needed moving from lying on your back to sitting on the side of a flat bed without using bedrails?: None Help needed moving to and from a bed to a chair (including a wheelchair)?: A Little Help needed standing up from a chair using your arms (e.g., wheelchair or bedside chair)?: A Little Help needed to walk in hospital room?: A Little Help needed climbing 3-5 steps with a railing? : A Lot 6 Click Score: 19    End of Session Equipment Utilized During Treatment: Gait belt;Other (comment) (wound vac) Activity Tolerance: Patient tolerated treatment well Patient left: in chair;with call bell/phone within reach;with chair alarm set Nurse Communication: Mobility status PT Visit Diagnosis: Unsteadiness on feet (R26.81);Other abnormalities of gait and mobility (R26.89);Muscle weakness (generalized) (M62.81);Difficulty in walking, not elsewhere classified (R26.2)    Time: 0815-0900 PT Time Calculation (min) (ACUTE ONLY): 45 min   Charges:     PT Treatments $Gait Training: 23-37 mins $Therapeutic  Activity: 8-22 mins        Lewis Shock, PT, DPT Acute Rehabilitation Services Pager #: [phone] Office #: [phone]   Iona Hansen 04/23/2021, 11:44 AM

## 2021-04-23 NOTE — Progress Notes (Signed)
Left message for [place], [place] about an order for [facility] for home and asking if he needed to check the wound area for any leakage.

## 2021-04-23 NOTE — Discharge Summary (Signed)
Physician Discharge Summary  RAFEL GARDE UOH:[id number] DOB: 03-Feb-1961 DOA: 04/17/2021  PCP: Pcp, No  Admit date: 04/17/2021 Discharge date: 04/23/2021  Admitted From: Home Disposition: Home   Recommendations for Outpatient Follow-up:  Follow up with PCP as scheduled for the patient prior to discharge to continue management of diabetes, HTN. Follow up with orthopedics as scheduled, discharging with wound vac on augmentin for 10 more days.  Home Health: PT Equipment/Devices: Praveena wound vac, 3 in 1, rolling walker, wheelchair/cushion Discharge Condition: Stable CODE STATUS: Full Diet recommendation: Heart healthy, carb-modified  Brief/Interim Summary: Jesse [name] is a 60 y.o. male with a longstanding history of poorly controlled diabetes, HTN, HLD, peripheral insensate diabetic neuropathy, and alcohol use who presented to the ED 6/7 with continued/worsening right foot wound despite taking antibiotics. He was admitted twice before (on 5/29 and again 6/4 - 6/5) for this problem but left AMA prior to definitive treatment. He was afebrile, WBC 10.7k, hemoglobin 12.1, BUN 30, creatinine 1.54, and glucose 230. Vancomycin, cefepime started, and orthopedics consulted, took for I&D on 6/8 and 6/10, with wound vac continuing at discharge. Wound culture grew Proteus for which the patient will continue on augmentin at discharge.   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 - NKDA. Will narrow abx to augmentin at discharge. Blood cultures from 6/4 negative. - s/p I&D 6/8, I&D with skin graft to massive wound on plantar aspect of right foot on 6/10 by Dr. Sharol Given.   Continue wound vac per orthopedics. Has follow up arranged.   Uncontrolled T2DM with peripheral neuropathy: HbA1c 10.9% on 04/14/21, poor adherence to medication regimen. - SSI, lantus 10u daily (prescribed at DC). This will provide improved but not perfect control. Will need PCP follow up. Pt has missed many appointment that were arranged for him in the past, so importance of follow up is stressed - Carb modified diet - Diabetes coordinator consulted. We've ordered and will provide the full host of diabetic supplies needed prior to his discharge. Pt has received intensive education and expresses firm understanding.   Diabetic polyneuropathy: - With borderline B12 (level 197 with LLN 180) and severe neuropathy, possible ongoing EtOH use, B12 supplement was ordered but declined.   HTN: - Continue losartan (provide Rx at DC)   HLD: - Continue statin (provide at DC)   AKI: Prerenal - Improved. CrCl >73m/min consistently.   Hyperkalemia: Resolved.   Alcohol abuse: Excessive daily drinking reported. No withdrawal noted. - Moderation counseling provided prior to discharge.   Right thumb swelling: Suspected to be traumatic with neuropathy. No current or previous history of erythema/swelling suggestive of gout or septic joint. No bony abnormalities on XR on my personal review. - Supportive care.    Acute blood loss anemia: Stable. Recheck at PCP follow up.   Obesity: Estimated body mass index is 31.87 kg/m  Discharge Instructions Discharge Instructions     Negative Pressure Wound Therapy - Incisional   Complete by: As directed    Show patient how to attach vac   Obtain the portable Praveena wound VAC pump from our stock on [address] or the operating room.  This needs to be plugged in and charged prior to  application for discharge.      Allergies as of 04/23/2021   No Known Allergies      Medication List     STOP taking these medications    doxycycline 100 MG tablet Commonly  known as: VIBRA-TABS       TAKE these medications    Accu-Chek Guide test strip Generic drug: glucose blood Use as instructed What changed: additional instructions   Accu-Chek Guide w/Device Kit Use as directed What changed:  medication strength additional instructions Another medication with the same name was removed. Continue taking this medication, and follow the directions you see here.   amoxicillin-clavulanate 875-125 MG tablet Commonly known as: AUGMENTIN Take 1 tablet by mouth every 12 (twelve) hours.   atorvastatin 40 MG tablet Commonly known as: LIPITOR Take 1 tablet (40 mg total) by mouth daily.   Lantus SoloStar 100 UNIT/ML Solostar Pen Generic drug: insulin glargine INJECT 10 UNITS INTO THE SKIN DAILY.   losartan 25 MG tablet Commonly known as: COZAAR TAKE 1 TABLET (25 MG TOTAL) BY MOUTH DAILY.   PenTips 31G X 8 MM Misc Generic drug: Insulin Pen Needle USE AS DIRECTED   TRUEplus Lancets 28G Misc USE UP TO FOUR TIMES DAILY AS DIRECTED What changed: Another medication with the same name was added. Make sure you understand how and when to take each.   Accu-Chek Softclix Lancets lancets Use as directed. What changed: You were already taking a medication with the same name, and this prescription was added. Make sure you understand how and when to take each.               Durable Medical Equipment  (From admission, onward)           Start     Ordered   04/23/21 0859  For home use only DME Walker rolling  Once       Question Answer Comment  Walker: With 5 Inch Wheels   Patient needs a walker to treat with the following condition Weakness      04/23/21 0858   04/19/21 1624  For home use only DME standard manual wheelchair with seat cushion  Once       Comments: Patient suffers from Excisional [place] which impairs their ability to perform daily activities like ambulating  in the home.  A cane will not resolve issue with  performing activities of daily living. A wheelchair will allow patient to safely perform daily activities. Patient can safely propel the wheelchair in the home or has a caregiver who can provide assistance. Length of need Lifetime. Accessories: elevating leg rests (ELRs), wheel locks, extensions and anti-tippers.  Seat and back cushions   04/19/21 1624   04/19/21 1622  For home use only DME 3 n 1  Once        04/19/21 1621   04/19/21 1622  For home use only DME Tub bench  Once        04/19/21 1621            Follow-up Information     Persons, Bevely Palmer, PA In 1 week.   Specialty: Orthopedic Surgery Contact information: [place] [address] [phone]         [place]. Go to.   Specialty: Obstetrics and Gynecology Why: May 22, 2021 at 2:10 pm This is not for OBGYN care, it's for PCP care Contact information: [address] [place] [phone]  No Known Allergies  Consultations: Orthopedics  Procedures/Studies: MR FOOT RIGHT WO CONTRAST  Result Date: 04/15/2021 CLINICAL DATA:  Pain and drainage from the right foot, possible osteomyelitis EXAM: MRI OF THE RIGHT FOREFOOT WITHOUT CONTRAST TECHNIQUE: Multiplanar, multisequence MR imaging of the right forefoot was performed. No intravenous contrast was administered. COMPARISON:  Radiograph 04/14/2021 FINDINGS: Bones/Joint/Cartilage Subtle accentuated T2 signal medially in the medial sesamoid of the first digit, probably degenerative. No compelling findings of osteomyelitis. Ligaments The Lisfranc ligament appears intact. Muscles and Tendons Low-level edema tracks in the visualized musculature of the foot in a diffuse fashion and may be neurogenic. No drainable abscess. Soft tissues Plantar cutaneous ulceration at about the level of the midfoot with gas in the adjacent subcutaneous tissues. This is at the proximal margin of imaging,  and extends around the lateral side of the midfoot in the subcutaneous tissues were there may be some blistering as well. Subcutaneous edema in the forefoot especially dorsally, extending into the toes, cellulitis is a distinct possibility. IMPRESSION: 1. No findings of osteomyelitis or drainable abscess. 2. Plantar ulceration below the midfoot and tracking around the lateral portion of the midfoot in the subcutaneous tissues, with some scattered gas in the subcutaneous tissues. 3. Low-level edema tracking in the musculature of the foot, quite likely neurogenic. 4. Subcutaneous edema in the foot, especially dorsally, potentially cellulitis. Electronically Signed   By: Van Clines M.D.   On: 04/15/2021 09:03   DG Chest Port 1 View  Result Date: 04/14/2021 CLINICAL DATA:  Weakness, swelling RIGHT foot, diabetic foot infection EXAM: PORTABLE CHEST 1 VIEW COMPARISON:  Portable exam 1532 hours compared to 02/07/2021 FINDINGS: Upper normal heart size. Mediastinal contours and pulmonary vascularity normal. Chronic elevation of RIGHT diaphragm with minimal RIGHT basilar atelectasis. Remaining lungs clear. No infiltrate, pleural effusion or pneumothorax. IMPRESSION: Chronic elevation of RIGHT diaphragm with minimal RIGHT basilar atelectasis. No acute abnormalities. Electronically Signed   By: Lavonia Dana M.D.   On: 04/14/2021 15:50   DG Finger Thumb Right  Result Date: 04/19/2021 CLINICAL DATA:  Right thumb swelling. EXAM: RIGHT THUMB 2+V COMPARISON:  None. FINDINGS: There is no evidence of fracture or dislocation. Moderate osteophyte formation is seen involving the first interphalangeal joint suggesting osteoarthritis. Soft tissue swelling of the thumb is noted suggesting possible cellulitis. IMPRESSION: Diffuse soft tissue swelling is noted concerning for possible cellulitis. 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 No fracture or dislocation is noted. Electronically Signed   By: Marijo Conception M.D.   On: 04/19/2021 12:30   DG Foot Complete Right  Result Date: 04/14/2021 CLINICAL DATA:  Diabetic foot infection, ongoing pain and swelling RIGHT foot after stepping on a nail, tried to drain pus, odor, pain and swelling noted, patient denies fever EXAM: RIGHT FOOT COMPLETE - 3+ VIEW COMPARISON:  04/08/2021 FINDINGS: Soft tissue swelling RIGHT foot. Osseous mineralization low normal. Joint spaces preserved. Tiny plantar calcaneal spur. Soft tissue defect at mid plantar arch question soft tissue wound. No acute fracture, dislocation, or bone destruction. Small vessel vascular calcifications at posterior tibial artery. IMPRESSION: Soft tissue swelling with ulcer at mid RIGHT plantar arch. No acute osseous abnormalities. Electronically Signed   By: Lavonia Dana M.D.   On: 04/14/2021 15:49   DG Foot Complete Right  Result Date: 04/08/2021 CLINICAL DATA:  Stepped on a nail several weeks ago, open wound medial plantar aspect of right foot EXAM: RIGHT FOOT COMPLETE - 3+ VIEW COMPARISON:  02/07/2021 FINDINGS: Frontal, oblique, lateral views of  the right foot are obtained. There are no acute displaced fractures. Alignment is anatomic. No radiopaque foreign bodies. There is diffuse soft tissue swelling of the foot. Subcutaneous gas is seen within the plantar lateral aspect of the right foot. No destructive bony lesions or periosteal reaction to suggest osteomyelitis. IMPRESSION: 1. Diffuse soft tissue swelling, with subcutaneous gas plantar lateral aspect right foot. 2. No acute or destructive bony lesions. 3. No radiopaque foreign body. Electronically Signed   By: Randa Ngo M.D.   On: 04/08/2021 20:54    04/18/2021 Dr. Sharol Given: Excisional DEBRIDEMENT OF RIGHT FOOT. Application of cleanse choice wound VAC sponges x2. 04/20/2021 Dr. Sharol Given: EXCISIONAL DEBRIDEMENT RIGHT FOOT, APPLICATION OF [place]. Application of cleanse choice wound VAC sponge  Subjective: Feels well, no pain. No  fever.   Discharge Exam: Vitals:   04/22/21 2121 04/23/21 0518  BP: 106/80 113/77  Pulse: 70 64  Resp: 17 18  Temp: 97.9 F (36.6 C) 98.1 F (36.7 C)  SpO2: 98% 99%   General: Pt is alert, awake, not in acute distress Cardiovascular: RRR, S1/S2 +, no rubs, no gallops Respiratory: CTA bilaterally, no wheezing, no rhonchi Abdominal: Soft, NT, ND, bowel sounds + Extremities: No edema, no cyanosis. RLE with wound vac with good suction in place, dried scant bleeding laterally. Wound bed without surrounding erythema.  Labs: BNP (last 3 results) Recent Labs    01/17/21 1815 02/07/21 1550  BNP 12.7 8.9   Basic Metabolic Panel: Recent Labs  Lab 04/18/21 0500 04/18/21 0850 04/19/21 0104 04/20/21 0139 04/21/21 0044 04/22/21 0213  NA 137  --  135 136 135 135  K 4.9  --  5.2* 4.6 5.0 4.3  CL 105  --  106 107 106 106  CO2 22  --  23 21* 20* 21*  GLUCOSE 195*  --  175* 208* 199* 154*  BUN 22*  --  22* 24* 23* 31*  CREATININE 1.29*  --  1.30* 1.32* 1.17 1.16  CALCIUM 9.5  --  8.9 9.1 8.9 8.9  MG  --  1.8  --   --   --   --   PHOS  --  3.7  --   --   --   --    Liver Function Tests: Recent Labs  Lab 04/18/21 0500  AST 21  ALT 27  ALKPHOS 94  BILITOT 0.3  PROT 7.8  ALBUMIN 2.9*   No results for input(s): LIPASE, AMYLASE in the last 168 hours. No results for input(s): AMMONIA in the last 168 hours. CBC: Recent Labs  Lab 04/17/21 0959 04/18/21 0850 04/19/21 0104 04/20/21 0139 04/21/21 0044 04/22/21 0213 04/23/21 0217  WBC 10.7* 8.1 14.8* 9.1 9.1 9.0  --   NEUTROABS 6.8  --   --   --   --   --   --   HGB 12.1* 13.2 11.3* 10.9* 10.7* 9.9* 10.0*  HCT 37.4* 40.4 34.0* 33.1* 31.9* 30.0* 30.4*  MCV 97.7 97.1 95.2 95.7 96.1 95.2  --   PLT 365 322 309 278 274 265  --    Cardiac Enzymes: No results for input(s): CKTOTAL, CKMB, CKMBINDEX, TROPONINI in the last 168 hours. BNP: Invalid input(s): POCBNP CBG: Recent Labs  Lab 04/22/21 1139 04/22/21 1642  04/22/21 2118 04/23/21 0734 04/23/21 1142  GLUCAP 169* 191* 166* 171* 231*   D-Dimer No results for input(s): DDIMER in the last 72 hours. Hgb A1c No results for input(s): HGBA1C in the last 72 hours.  Lipid Profile No results for input(s): CHOL, HDL, LDLCALC, TRIG, CHOLHDL, LDLDIRECT in the last 72 hours. Thyroid function studies No results for input(s): TSH, T4TOTAL, T3FREE, THYROIDAB in the last 72 hours.  Invalid input(s): FREET3 Anemia work up No results for input(s): VITAMINB12, FOLATE, FERRITIN, TIBC, IRON, RETICCTPCT in the last 72 hours. Urinalysis    Component Value Date/Time   COLORURINE YELLOW 02/07/2021 1521   APPEARANCEUR CLEAR 02/07/2021 1521   LABSPEC 1.014 02/07/2021 1521   PHURINE 5.0 02/07/2021 1521   GLUCOSEU 150 (A) 02/07/2021 1521   HGBUR SMALL (A) 02/07/2021 1521   BILIRUBINUR NEGATIVE 02/07/2021 1521   KETONESUR NEGATIVE 02/07/2021 1521   PROTEINUR NEGATIVE 02/07/2021 1521   NITRITE NEGATIVE 02/07/2021 1521   LEUKOCYTESUR NEGATIVE 02/07/2021 1521    Microbiology Recent Results (from the past 240 hour(s))  Culture, blood (routine x 2)     Status: None   Collection Time: 04/14/21  4:30 PM   Specimen: Right Antecubital; Blood  Result Value Ref Range Status   Specimen Description RIGHT ANTECUBITAL  Final   Special Requests   Final    BOTTLES DRAWN AEROBIC AND ANAEROBIC Blood Culture adequate volume   Culture   Final    NO GROWTH 5 DAYS Performed at [facility], [place] [address] [place], [place] [address]    Report Status 04/19/2021 FINAL  Final  Resp Panel by RT-PCR (Flu A&B, Covid) Nasopharyngeal Swab     Status: None   Collection Time: 04/14/21  4:30 PM   Specimen: Nasopharyngeal Swab; Nasopharyngeal(NP) swabs in vial transport medium  Result Value Ref Range Status   SARS Coronavirus 2 by RT PCR NEGATIVE NEGATIVE Final    Comment: (NOTE) SARS-CoV-2 target nucleic acids are NOT DETECTED.  The SARS-CoV-2 RNA is generally detectable in  upper respiratory specimens during the acute phase of infection. The lowest concentration of SARS-CoV-2 viral copies this assay can detect is 138 copies/mL. A negative result does not preclude SARS-Cov-2 infection and should not be used as the sole basis for treatment or other patient management decisions. A negative result may occur with  improper specimen collection/handling, submission of specimen other than nasopharyngeal swab, presence of viral mutation(s) within the areas targeted by this assay, and inadequate number of viral copies(<138 copies/mL). A negative result must be combined with clinical observations, patient history, and epidemiological information. The expected result is Negative.  Fact Sheet for Patients:  EntrepreneurPulse.com.au  Fact Sheet for Healthcare Providers:  IncredibleEmployment.be  This test is no t yet approved or cleared by the [place] FDA and  has been authorized for detection and/or diagnosis of SARS-CoV-2 by FDA under an Emergency Use Authorization (EUA). This EUA will remain  in effect (meaning this test can be used) for the duration of the COVID-19 declaration under Section 564(b)(1) of the Act, 21 U.S.C.section 360bbb-3(b)(1), unless the authorization is terminated  or revoked sooner.       Influenza A by PCR NEGATIVE NEGATIVE Final   Influenza B by PCR NEGATIVE NEGATIVE Final    Comment: (NOTE) The Xpert Xpress SARS-CoV-2/FLU/RSV plus assay is intended as an aid in the diagnosis of influenza from Nasopharyngeal swab specimens and should not be used as a sole basis for treatment. Nasal washings and aspirates are unacceptable for Xpert Xpress SARS-CoV-2/FLU/RSV testing.  Fact Sheet for Patients: EntrepreneurPulse.com.au  Fact Sheet for Healthcare Providers: IncredibleEmployment.be  This test is not yet approved or cleared by the [place] FDA and has been  authorized for detection and/or  diagnosis of SARS-CoV-2 by FDA under an Emergency Use Authorization (EUA). This EUA will remain in effect (meaning this test can be used) for the duration of the COVID-19 declaration under Section 564(b)(1) of the Act, 21 U.S.C. section 360bbb-3(b)(1), unless the authorization is terminated or revoked.  Performed at [facility], [place] [address], [place], [place] [address]   Resp Panel by RT-PCR (Flu A&B, Covid) Nasopharyngeal Swab     Status: None   Collection Time: 04/17/21  9:40 AM   Specimen: Nasopharyngeal Swab; Nasopharyngeal(NP) swabs in vial transport medium  Result Value Ref Range Status   SARS Coronavirus 2 by RT PCR NEGATIVE NEGATIVE Final    Comment: (NOTE) SARS-CoV-2 target nucleic acids are NOT DETECTED.  The SARS-CoV-2 RNA is generally detectable in upper respiratory specimens during the acute phase of infection. The lowest concentration of SARS-CoV-2 viral copies this assay can detect is 138 copies/mL. A negative result does not preclude SARS-Cov-2 infection and should not be used as the sole basis for treatment or other patient management decisions. A negative result may occur with  improper specimen collection/handling, submission of specimen other than nasopharyngeal swab, presence of viral mutation(s) within the areas targeted by this assay, and inadequate number of viral copies(<138 copies/mL). A negative result must be combined with clinical observations, patient history, and epidemiological information. The expected result is Negative.  Fact Sheet for Patients:  EntrepreneurPulse.com.au  Fact Sheet for Healthcare Providers:  IncredibleEmployment.be  This test is no t yet approved or cleared by the [place] FDA and  has been authorized for detection and/or diagnosis of SARS-CoV-2 by FDA under an Emergency Use Authorization (EUA). This EUA will remain  in effect (meaning this test  can be used) for the duration of the COVID-19 declaration under Section 564(b)(1) of the Act, 21 U.S.C.section 360bbb-3(b)(1), unless the authorization is terminated  or revoked sooner.       Influenza A by PCR NEGATIVE NEGATIVE Final   Influenza B by PCR NEGATIVE NEGATIVE Final    Comment: (NOTE) The Xpert Xpress SARS-CoV-2/FLU/RSV plus assay is intended as an aid in the diagnosis of influenza from Nasopharyngeal swab specimens and should not be used as a sole basis for treatment. Nasal washings and aspirates are unacceptable for Xpert Xpress SARS-CoV-2/FLU/RSV testing.  Fact Sheet for Patients: EntrepreneurPulse.com.au  Fact Sheet for Healthcare Providers: IncredibleEmployment.be  This test is not yet approved or cleared by the [place] FDA and has been authorized for detection and/or diagnosis of SARS-CoV-2 by FDA under an Emergency Use Authorization (EUA). This EUA will remain in effect (meaning this test can be used) for the duration of the COVID-19 declaration under Section 564(b)(1) of the Act, 21 U.S.C. section 360bbb-3(b)(1), unless the authorization is terminated or revoked.  Performed at [facility], [place] [address] [place], [place] [address]   Aerobic/Anaerobic Culture w Gram Stain (surgical/deep wound)     Status: None   Collection Time: 04/18/21  2:22 PM   Specimen: Soft Tissue, Other  Result Value Ref Range Status   Specimen Description TISSUE RIGHT FOOT  Final   Special Requests FROM ULER ABSCESS  Final   Gram Stain NO WBC SEEN NO ORGANISMS SEEN   Final   Culture   Final    RARE PROTEUS MIRABILIS WITHIN MIXED ORGANISMS NO ANAEROBES ISOLATED Performed at [facility], [place] [address], [place], [place] [address]    Report Status 04/23/2021 FINAL  Final   Organism ID, Bacteria PROTEUS MIRABILIS  Final      Susceptibility   Proteus mirabilis - MIC*    AMPICILLIN <=2 SENSITIVE Sensitive     CEFAZOLIN  <=4 SENSITIVE Sensitive     CEFEPIME <=0.12 SENSITIVE Sensitive     CEFTAZIDIME <=1 SENSITIVE Sensitive     CEFTRIAXONE <=0.25 SENSITIVE Sensitive     CIPROFLOXACIN <=0.25 SENSITIVE Sensitive     GENTAMICIN <=1 SENSITIVE Sensitive     IMIPENEM 2 SENSITIVE Sensitive     TRIMETH/SULFA <=20 SENSITIVE Sensitive     AMPICILLIN/SULBACTAM <=2 SENSITIVE Sensitive     PIP/TAZO <=4 SENSITIVE Sensitive     * RARE PROTEUS MIRABILIS  Surgical pcr screen     Status: Abnormal   Collection Time: 04/20/21  2:22 AM   Specimen: Nasal Mucosa; Nasal Swab  Result Value Ref Range Status   MRSA, PCR NEGATIVE NEGATIVE Final   Staphylococcus aureus POSITIVE (A) NEGATIVE Final    Comment: (NOTE) The Xpert SA Assay (FDA approved for NASAL specimens in patients 37 years of age and older), is one component of a comprehensive surveillance program. It is not intended to diagnose infection nor to guide or monitor treatment. Performed at [facility], [place] [address], [place], [place] [address]     Time coordinating discharge: Approximately 40 minutes  Patrecia Pour, MD  [facility] 04/23/2021, 1:06 PM

## 2021-04-24 ENCOUNTER — Other Ambulatory Visit: Payer: Self-pay

## 2021-04-24 ENCOUNTER — Emergency Department (HOSPITAL_COMMUNITY): Payer: Self-pay

## 2021-04-24 ENCOUNTER — Emergency Department (HOSPITAL_COMMUNITY)
Admission: EM | Admit: 2021-04-24 | Discharge: 2021-04-24 | Disposition: A | Discharge status: Home / Self Care | Payer: Self-pay | Attending: Emergency Medicine | Admitting: Emergency Medicine

## 2021-04-24 DIAGNOSIS — Z4800 Encounter for change or removal of nonsurgical wound dressing: Secondary | ICD-10-CM | POA: Insufficient documentation

## 2021-04-24 DIAGNOSIS — Z5189 Encounter for other specified aftercare: Secondary | ICD-10-CM

## 2021-04-24 DIAGNOSIS — M79671 Pain in right foot: Secondary | ICD-10-CM | POA: Insufficient documentation

## 2021-04-24 DIAGNOSIS — I10 Essential (primary) hypertension: Secondary | ICD-10-CM | POA: Insufficient documentation

## 2021-04-24 DIAGNOSIS — W1830XA Fall on same level, unspecified, initial encounter: Secondary | ICD-10-CM | POA: Insufficient documentation

## 2021-04-24 DIAGNOSIS — D72829 Elevated white blood cell count, unspecified: Secondary | ICD-10-CM | POA: Insufficient documentation

## 2021-04-24 DIAGNOSIS — Y92009 Unspecified place in unspecified non-institutional (private) residence as the place of occurrence of the external cause: Secondary | ICD-10-CM | POA: Insufficient documentation

## 2021-04-24 DIAGNOSIS — E119 Type 2 diabetes mellitus without complications: Secondary | ICD-10-CM | POA: Insufficient documentation

## 2021-04-24 LAB — CBC WITH DIFFERENTIAL/PLATELET
Abs Immature Granulocytes: 0.06 10*3/uL (ref 0.00–0.07)
Basophils Absolute: 0.1 10*3/uL (ref 0.0–0.1)
Basophils Relative: 1 %
Eosinophils Absolute: 0.1 10*3/uL (ref 0.0–0.5)
Eosinophils Relative: 1 %
HCT: 33.3 % — ABNORMAL LOW (ref 39.0–52.0)
Hemoglobin: 10.7 g/dL — ABNORMAL LOW (ref 13.0–17.0)
Immature Granulocytes: 1 %
Lymphocytes Relative: 19 %
Lymphs Abs: 2.1 10*3/uL (ref 0.7–4.0)
MCH: 31.2 pg (ref 26.0–34.0)
MCHC: 32.1 g/dL (ref 30.0–36.0)
MCV: 97.1 fL (ref 80.0–100.0)
Monocytes Absolute: 1.2 10*3/uL — ABNORMAL HIGH (ref 0.1–1.0)
Monocytes Relative: 11 %
Neutro Abs: 7.6 10*3/uL (ref 1.7–7.7)
Neutrophils Relative %: 67 %
Platelets: 294 10*3/uL (ref 150–400)
RBC: 3.43 MIL/uL — ABNORMAL LOW (ref 4.22–5.81)
RDW: 13 % (ref 11.5–15.5)
WBC: 11.1 10*3/uL — ABNORMAL HIGH (ref 4.0–10.5)
nRBC: 0 % (ref 0.0–0.2)

## 2021-04-24 LAB — BASIC METABOLIC PANEL
Anion gap: 9 (ref 5–15)
BUN: 31 mg/dL — ABNORMAL HIGH (ref 6–20)
CO2: 20 mmol/L — ABNORMAL LOW (ref 22–32)
Calcium: 9.8 mg/dL (ref 8.9–10.3)
Chloride: 106 mmol/L (ref 98–111)
Creatinine, Ser: 1.53 mg/dL — ABNORMAL HIGH (ref 0.61–1.24)
GFR, Estimated: 52 mL/min — ABNORMAL LOW (ref >60)
Glucose, Bld: 201 mg/dL — ABNORMAL HIGH (ref 70–99)
Potassium: 4.1 mmol/L (ref 3.5–5.1)
Sodium: 135 mmol/L (ref 135–145)

## 2021-04-24 LAB — LACTIC ACID, PLASMA: Lactic Acid, Venous: 1.6 mmol/L (ref 0.5–1.9)

## 2021-04-24 IMAGING — DX DG FOOT COMPLETE 3+V*R*
3 series · 3 of 3 positions shown · non-contrast
Comparison: [DATE]

CLINICAL DATA: Fall at home yesterday. Foot pain. Foot soft tissue
wound with wound VAC.

EXAM:
RIGHT FOOT COMPLETE - 3+ VIEW

[foot ap]
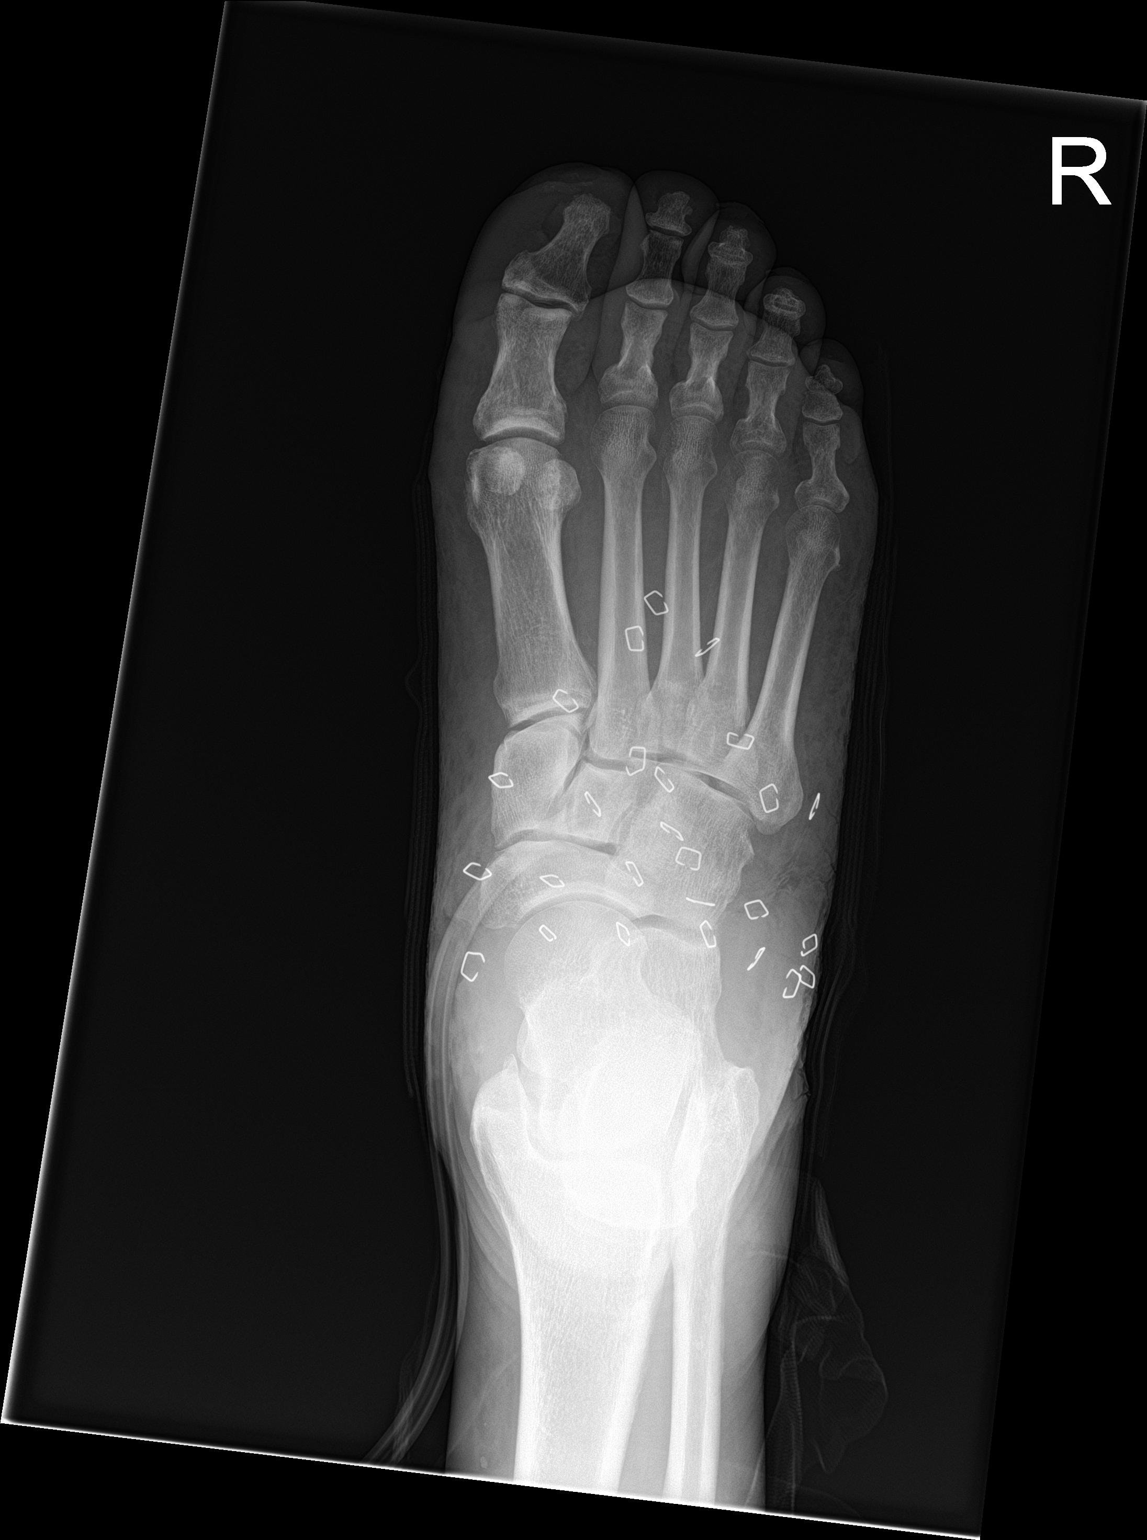

[foot obl]
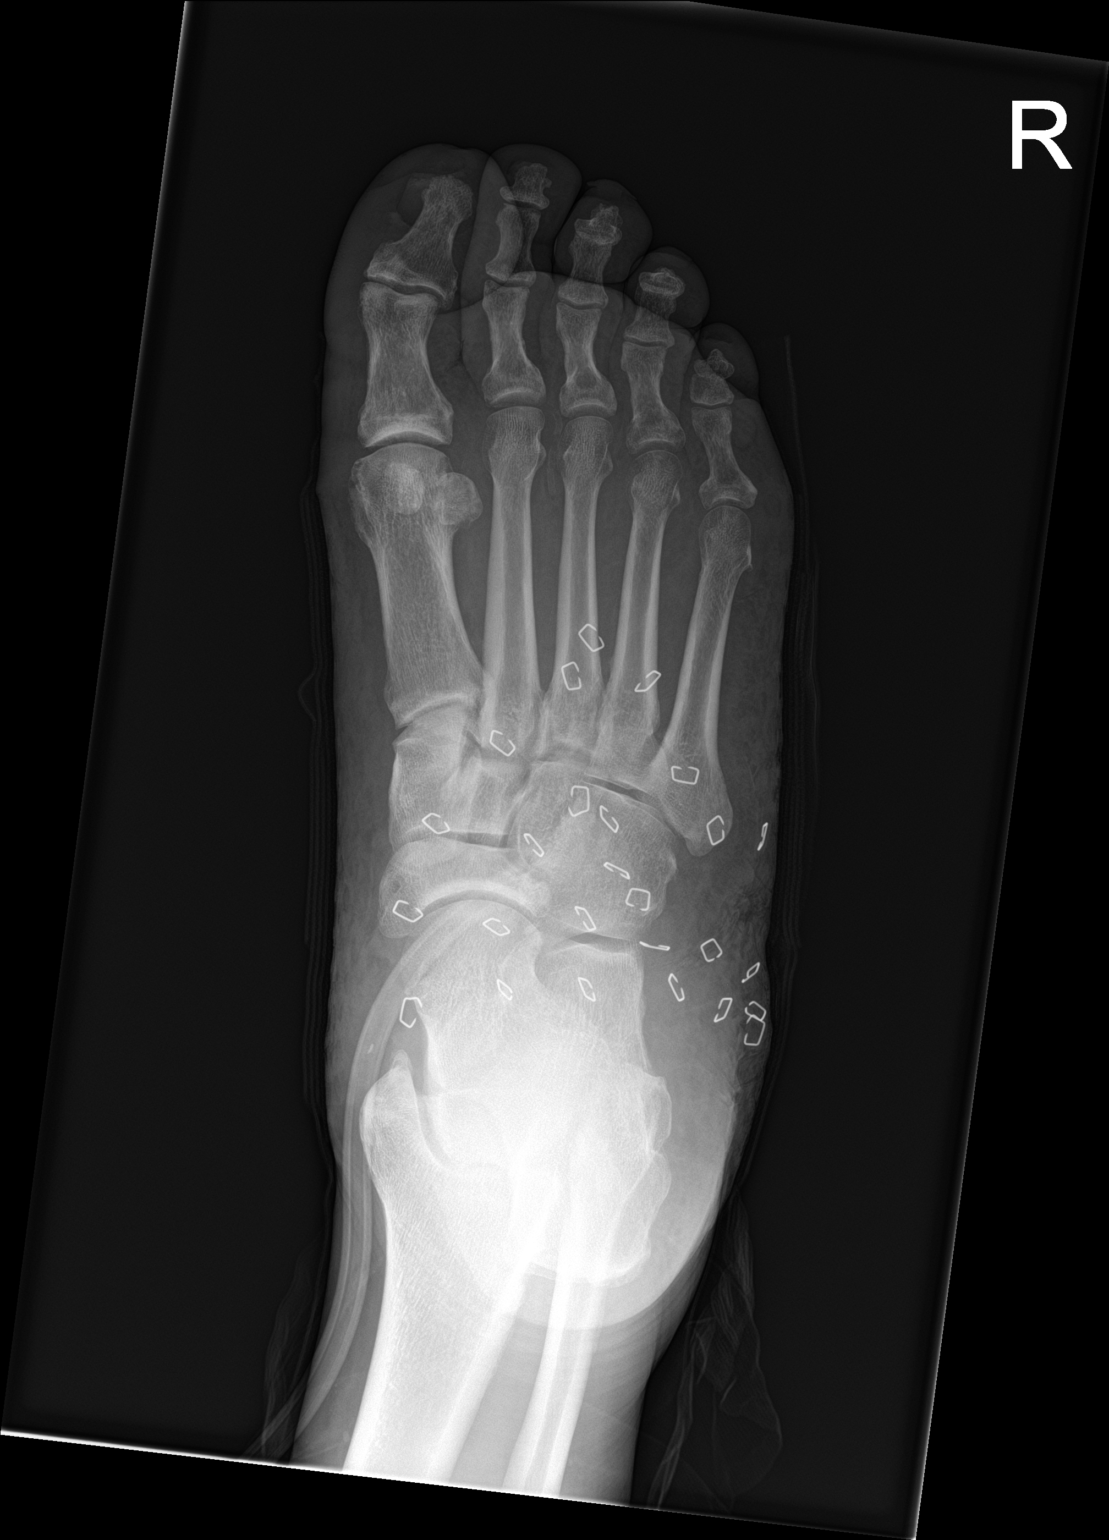

[foot lat]
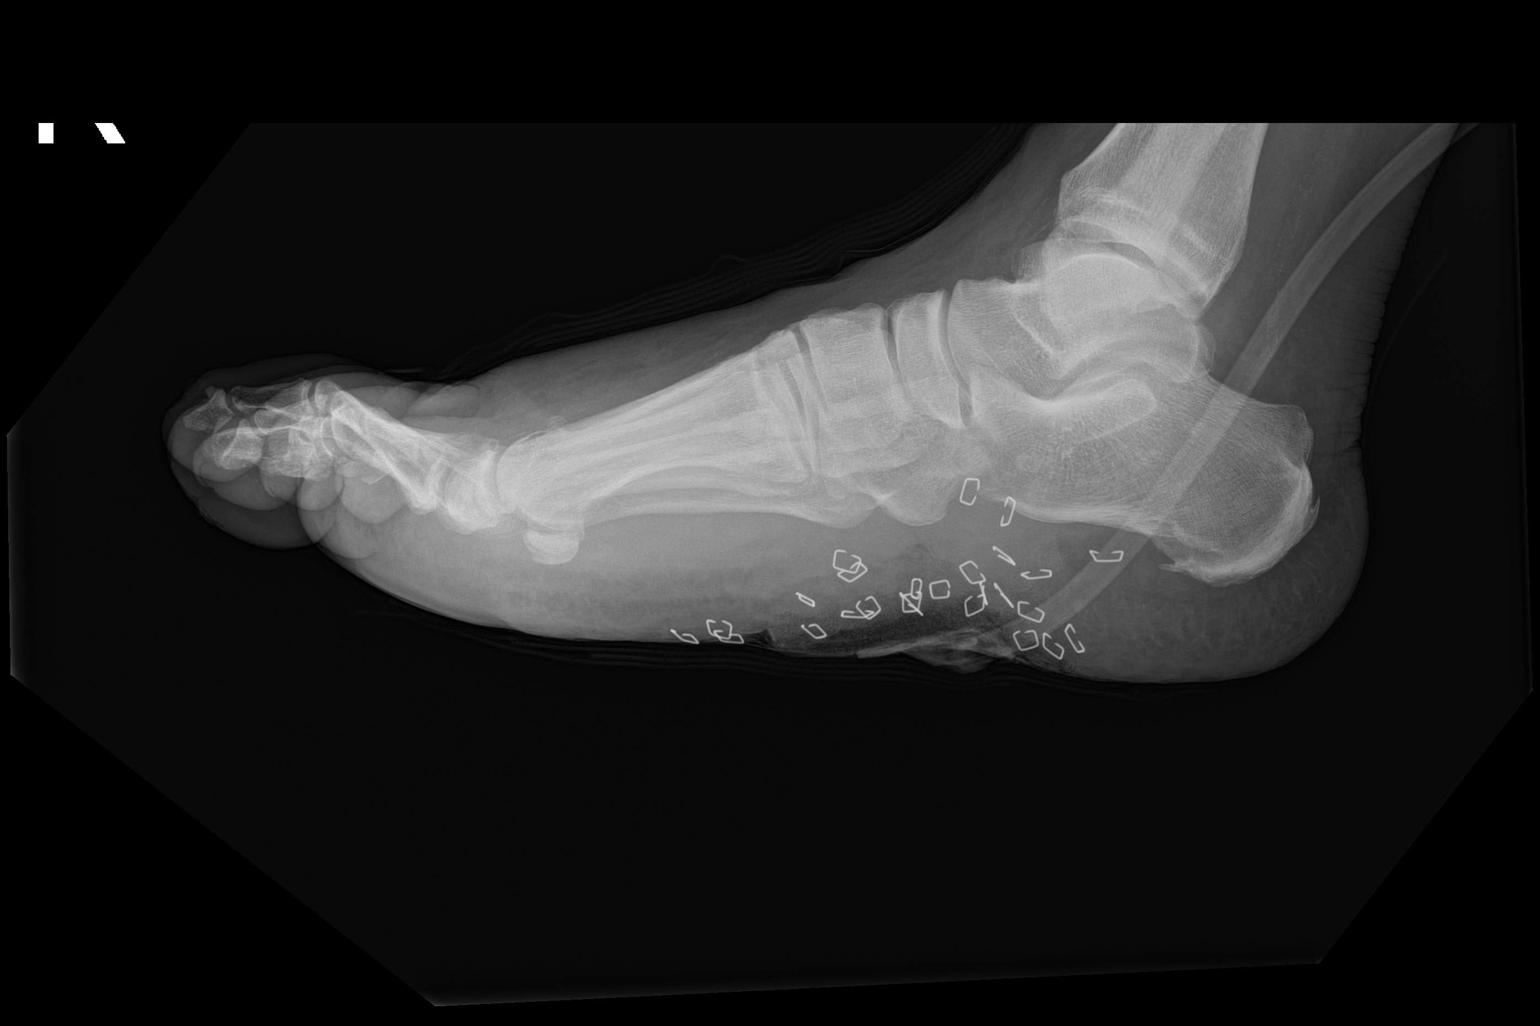

[3 of 3 positions shown; findings below may reference images not displayed]

FINDINGS: There is no evidence of fracture or dislocation. No evidence of
osteolysis or periostitis. There is no evidence of arthropathy. Tiny
plantar and dorsal calcaneal bone spurs noted.

Multiple surgical staples are seen in plantar soft tissues of the
midfoot, with wound VAC also seen in place.
IMPRESSION: No acute osseous abnormality.

Plantar soft tissue surgical staples and wound VAC.

## 2021-04-24 NOTE — Discharge Instructions (Addendum)
Your kidney function today was a little bit worse than it has been in the past.  Make sure you are staying well-hydrated water. Follow-up with Dr. Lajoyce Corners to on Friday at your scheduled appointment. Return to the emergency room if you develop fevers, severe worsening pain, or any new, worsening, or concerning symptoms.

## 2021-04-24 NOTE — ED Provider Notes (Signed)
[facility] EMERGENCY DEPARTMENT Provider Note   CSN: [id number] Arrival date & time: 04/24/21  1102     History Chief Complaint  Patient presents with   Wound Check    Jesse [name] is a 60 y.o. male presenting for evaluation of right foot pain.  Patient states he had surgery on his right foot during a hospitalization, was discharged yesterday.  He was using the crutches yesterday when he fell backwards.  He had worsening pain of his right foot since then.  He hit his head, but denies loss of consciousness.  No neck or back pain.  No chest or abdominal pain.  No fevers.  He has a follow-up appointment with Dr. Sharol Given to from orthopedics on Friday, in 3 days.   HPI     Past Medical History:  Diagnosis Date   Alcohol abuse    Diabetes mellitus type 2 in obese ([place])    HTN (hypertension)     Patient Active Problem List   Diagnosis Date Noted   Cellulitis of right foot 04/17/2021   Osteomyelitis ([place]) 04/14/2021   Right foot infection    Diabetic foot infection ([place]) 04/08/2021   HTN (hypertension) 04/08/2021   HLD (hyperlipidemia) 04/08/2021   Alcohol abuse    Cellulitis    Bilateral leg edema 02/07/2021   Anasarca    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 2 diabetes mellitus, limited to breakdown of skin ([place])    Hypokalemia 01/28/2014   DM (diabetes mellitus), type 2, uncontrolled ([place]) 01/27/2014   AKI (acute kidney injury) ([place]) 01/27/2014   Leukocytosis 01/27/2014   Facial cellulitis 01/26/2014   Dental abscess 01/26/2014   Infected dental carries 01/26/2014   Cellulitis and abscess 01/26/2014    Past Surgical History:  Procedure Laterality Date   I & D EXTREMITY Right 04/18/2021   Procedure: IRRIGATION AND DEBRIDEMENT OF FOOT;  Surgeon: Newt Minion, MD;  Location: [place];  Service: Orthopedics;  Laterality: Right;   I & D EXTREMITY Right 04/20/2021   Procedure: EXCISIONAL DEBRIDEMENT RIGHT FOOT, APPLICATION OF SKIN  GRAFT;  Surgeon: Newt Minion, MD;  Location: [place];  Service: Orthopedics;  Laterality: Right;       No family history on file.  Social History   Tobacco Use   Smoking status: Never   Smokeless tobacco: Never  Vaping Use   Vaping Use: Never used  Substance Use Topics   Alcohol use: Yes    Alcohol/week: 2.0 standard drinks    Types: 2 Cans of beer per week    Comment: Occasional   Drug use: No    Home Medications Prior to Admission medications   Medication Sig Start Date End Date Taking? Authorizing Provider  Accu-Chek Softclix Lancets lancets Use as directed. 04/20/21   Patrecia Pour, MD  amoxicillin-clavulanate (AUGMENTIN) 875-125 MG tablet Take 1 tablet by mouth every 12 (twelve) hours. 04/23/21   Patrecia Pour, MD  atorvastatin (LIPITOR) 40 MG tablet Take 1 tablet (40 mg total) by mouth daily. 04/20/21   Patrecia Pour, MD  Blood Glucose Monitoring Suppl (ACCU-CHEK GUIDE) w/Device KIT Use as directed 04/20/21   Patrecia Pour, MD  glucose blood (ACCU-CHEK GUIDE) test strip Use as instructed 04/20/21   Patrecia Pour, MD  insulin glargine (LANTUS) 100 UNIT/ML Solostar Pen INJECT 10 UNITS INTO THE SKIN DAILY. 04/20/21   Patrecia Pour, MD  Insulin Pen Needle 31G X 8 MM MISC USE AS  DIRECTED 02/09/21 02/09/22  Simmons-Robinson, Makiera, MD  losartan (COZAAR) 25 MG tablet TAKE 1 TABLET (25 MG TOTAL) BY MOUTH DAILY. 04/20/21 04/20/22  Patrecia Pour, MD  TRUEplus Lancets 28G MISC USE UP TO FOUR TIMES DAILY AS DIRECTED 02/09/21 02/09/22  Simmons-Robinson, Riki Sheer, MD  glipiZIDE (GLUCOTROL) 5 MG tablet Take 1 tablet (5 mg total) by mouth daily before breakfast. Patient not taking: Reported on 01/14/2016 01/31/14 01/14/16  Delfina Redwood, MD    Allergies    Patient has no known allergies.  Review of Systems   Review of Systems  Musculoskeletal:  Positive for arthralgias.  All other systems reviewed and are negative.  Physical Exam Updated Vital Signs BP 131/81   Pulse 80   Temp 98.5 F  (36.9 C) (Oral)   Resp 16   SpO2 100%   Physical Exam Vitals and nursing note reviewed.  Constitutional:      General: He is not in acute distress.    Appearance: Normal appearance.     Comments: Resting in the bed in no acute distress  HENT:     Head: Normocephalic and atraumatic.  Eyes:     Conjunctiva/sclera: Conjunctivae normal.     Pupils: Pupils are equal, round, and reactive to light.  Cardiovascular:     Rate and Rhythm: Normal rate and regular rhythm.     Pulses: Normal pulses.  Pulmonary:     Effort: Pulmonary effort is normal. No respiratory distress.     Breath sounds: Normal breath sounds. No wheezing.     Comments: Speaking in full sentences.  Clear lung sounds in all fields. Abdominal:     General: There is no distension.     Palpations: Abdomen is soft.     Tenderness: There is no abdominal tenderness.  Musculoskeletal:     Cervical back: Normal range of motion and neck supple.     Comments: Wound VAC present on dorsal aspect of right foot.  Also with surrounding swelling noted at the base of the right great toe without active drainage.  No tenderness palpation of the dorsal foot.  No tenderness palpation of the ankle.  Achilles tendon palpable and intact.  No TTP of right lower leg  Skin:    General: Skin is warm and dry.     Capillary Refill: Capillary refill takes less than 2 seconds.  Neurological:     Mental Status: He is alert and oriented to person, place, and time.  Psychiatric:        Mood and Affect: Mood and affect normal.        Speech: Speech normal.        Behavior: Behavior normal.    ED Results / Procedures / Treatments   Labs (all labs ordered are listed, but only abnormal results are displayed) Labs Reviewed  CBC WITH DIFFERENTIAL/PLATELET - Abnormal; Notable for the following components:      Result Value   WBC 11.1 (*)    RBC 3.43 (*)    Hemoglobin 10.7 (*)    HCT 33.3 (*)    Monocytes Absolute 1.2 (*)    All other components  within normal limits  BASIC METABOLIC PANEL - Abnormal; Notable for the following components:   CO2 20 (*)    Glucose, Bld 201 (*)    BUN 31 (*)    Creatinine, Ser 1.53 (*)    GFR, Estimated 52 (*)    All other components within normal limits  LACTIC ACID, PLASMA  EKG None  Radiology DG Foot Complete Right  Result Date: 04/24/2021 CLINICAL DATA:  Fall at home yesterday. Foot pain. Foot soft tissue wound with wound VAC. EXAM: RIGHT FOOT COMPLETE - 3+ VIEW COMPARISON:  04/14/2021 FINDINGS: There is no evidence of fracture or dislocation. No evidence of osteolysis or periostitis. There is no evidence of arthropathy. Tiny plantar and dorsal calcaneal bone spurs noted. Multiple surgical staples are seen in plantar soft tissues of the midfoot, with wound VAC also seen in place. IMPRESSION: No acute osseous abnormality. Plantar soft tissue surgical staples and wound VAC. Electronically Signed   By: Marlaine Hind M.D.   On: 04/24/2021 12:41    Procedures Procedures   Medications Ordered in ED Medications - No data to display  ED Course  I have reviewed the triage vital signs and the nursing notes.  Pertinent labs & imaging results that were available during my care of the patient were reviewed by me and considered in my medical decision making (see chart for details).    MDM Rules/Calculators/A&P                          Patient presenting for evaluation of right foot pain after fall yesterday.  On exam, patient appears nontoxic.  No obvious deformity.  He does have a wound VAC in place, but no signs of acute infection.  When patient checked in initially, blood pressure was noted to be low, however on recheck without intervention, blood pressure improved with a normal map.  Patient does not report any systemic symptoms, and I have low suspicion for sepsis or shock or hemorrhage.  On further interview, patient is requesting readmission for a few more days. However not for any particular  reason.  In the setting of fall and worsening pain, will obtain x-ray of the foot.  Will obtain basic labs due to patient's initial blood pressure, however blood pressure remained stable and labs are reassuring, patient can likely be discharged.  X-ray viewed and independently interpreted by me, no fracture or dislocation.  Per radiology, no acute abnormalities.  Mild leukocytosis of 11, likely reactive to recent surgery.  Otherwise labs are reassuring.  Kidney function is slightly elevated from baseline, however not concerning for AKI.  Discussed findings with patient importance of hydration.  Encourage close follow-up with Ortho as scheduled.  Patient's blood pressure has remained stable and without hypotension for several hours.  At this time, patient appears safe for discharge.  Return precautions given.  Patient states he understands and agrees to plan.  Final Clinical Impression(s) / ED Diagnoses Final diagnoses:  Visit for wound check  Right foot pain    Rx / DC Orders ED Discharge Orders     None        Franchot Heidelberg, PA-C 04/24/21 1456    Sherwood Gambler, MD 04/24/21 1520

## 2021-04-24 NOTE — ED Triage Notes (Signed)
Patient POV to ED for issues with his wound on his R foot. Pt reported falling at home yesterday- mechanically, but is worried about the output in his wound vac. Pt in triage had BP of 72/46 and was rushed back to room. Keenan Bachelor, PA at bedside. Pt found to be normotensive. Aox4.

## 2021-04-24 NOTE — ED Notes (Signed)
Pt transported to xray 

## 2021-04-24 NOTE — ED Notes (Signed)
Patient transported to X-ray 

## 2021-04-25 ENCOUNTER — Ambulatory Visit (INDEPENDENT_AMBULATORY_CARE_PROVIDER_SITE_OTHER): Payer: Self-pay | Admitting: Orthopedic Surgery

## 2021-04-25 ENCOUNTER — Encounter: Payer: Self-pay | Admitting: Physician Assistant

## 2021-04-25 ENCOUNTER — Emergency Department (HOSPITAL_COMMUNITY)
Admission: EM | Admit: 2021-04-25 | Discharge: 2021-04-25 | Disposition: A | Discharge status: Home / Self Care | Payer: Self-pay | Attending: Emergency Medicine | Admitting: Emergency Medicine

## 2021-04-25 DIAGNOSIS — E1165 Type 2 diabetes mellitus with hyperglycemia: Secondary | ICD-10-CM

## 2021-04-25 DIAGNOSIS — Z5321 Procedure and treatment not carried out due to patient leaving prior to being seen by health care provider: Secondary | ICD-10-CM | POA: Insufficient documentation

## 2021-04-25 DIAGNOSIS — R739 Hyperglycemia, unspecified: Secondary | ICD-10-CM

## 2021-04-25 DIAGNOSIS — L97511 Non-pressure chronic ulcer of other part of right foot limited to breakdown of skin: Secondary | ICD-10-CM

## 2021-04-25 LAB — CBC WITH DIFFERENTIAL/PLATELET
Abs Immature Granulocytes: 0.06 10*3/uL (ref 0.00–0.07)
Basophils Absolute: 0.1 10*3/uL (ref 0.0–0.1)
Basophils Relative: 0 %
Eosinophils Absolute: 0.2 10*3/uL (ref 0.0–0.5)
Eosinophils Relative: 1 %
HCT: 32 % — ABNORMAL LOW (ref 39.0–52.0)
Hemoglobin: 10.5 g/dL — ABNORMAL LOW (ref 13.0–17.0)
Immature Granulocytes: 0 %
Lymphocytes Relative: 15 %
Lymphs Abs: 2 10*3/uL (ref 0.7–4.0)
MCH: 31.8 pg (ref 26.0–34.0)
MCHC: 32.8 g/dL (ref 30.0–36.0)
MCV: 97 fL (ref 80.0–100.0)
Monocytes Absolute: 1.7 10*3/uL — ABNORMAL HIGH (ref 0.1–1.0)
Monocytes Relative: 13 %
Neutro Abs: 9.6 10*3/uL — ABNORMAL HIGH (ref 1.7–7.7)
Neutrophils Relative %: 71 %
Platelets: 274 10*3/uL (ref 150–400)
RBC: 3.3 MIL/uL — ABNORMAL LOW (ref 4.22–5.81)
RDW: 13.1 % (ref 11.5–15.5)
WBC: 13.5 10*3/uL — ABNORMAL HIGH (ref 4.0–10.5)
nRBC: 0 % (ref 0.0–0.2)

## 2021-04-25 LAB — BASIC METABOLIC PANEL
Anion gap: 11 (ref 5–15)
BUN: 32 mg/dL — ABNORMAL HIGH (ref 6–20)
CO2: 21 mmol/L — ABNORMAL LOW (ref 22–32)
Calcium: 9.4 mg/dL (ref 8.9–10.3)
Chloride: 104 mmol/L (ref 98–111)
Creatinine, Ser: 1.69 mg/dL — ABNORMAL HIGH (ref 0.61–1.24)
GFR, Estimated: 46 mL/min — ABNORMAL LOW (ref >60)
Glucose, Bld: 132 mg/dL — ABNORMAL HIGH (ref 70–99)
Potassium: 4.3 mmol/L (ref 3.5–5.1)
Sodium: 136 mmol/L (ref 135–145)

## 2021-04-25 LAB — CBG MONITORING, ED: Glucose-Capillary: 112 mg/dL — ABNORMAL HIGH (ref 70–99)

## 2021-04-25 NOTE — Discharge Instructions (Addendum)
Follow up with primary care as directed.  Take insulin every day.

## 2021-04-25 NOTE — ED Provider Notes (Signed)
Emergency Medicine Provider Triage Evaluation Note  Jesse [name] , a 60 y.o. male  was evaluated in triage.  Pt complains of not taking his insulin. Patient has not had his insulin in 2 days and reports to the ED to figure out how and when to take his medications. Denies nausea, vomiting, and abdominal pain. He is currently being treated for a foot infection and was recently admitted to the hospital for the same. He admits to slight decrease in po intake over the past few days  Review of Systems  Positive: Appetite change Negative: N/V  Physical Exam  There were no vitals taken for this visit. Gen:   Awake, no distress   Resp:  Normal effort  MSK:   Moves extremities without difficulty  Other:    Medical Decision Making  Medically screening exam initiated at 5:03 PM.  Appropriate orders placed.  Jesse [name] was informed that the remainder of the evaluation will be completed by another provider, this initial triage assessment does not replace that evaluation, and the importance of remaining in the ED until their evaluation is complete.  Diabetic, not taking insulin for the past 2 days. CBG 112 in triage. Labs ordered due to decrease po intake.    Jesse Stabile, PA-C 04/25/21 1709    Jesse Dibbles, MD 04/26/21 [phone]

## 2021-04-25 NOTE — Progress Notes (Signed)
Office Visit Note   Patient: Jesse [name]           Date of Birth: [birth year]           MRN: [id number] Visit Date: 04/25/2021              Requested by: No referring provider defined for this encounter. PCP: Pcp, No  Chief Complaint  Patient presents with   Right Foot - Routine Post Op    04/20/21 excisional debridement right foot abscess Kerecis graft . S/p fall eval in ER yesterday wound vac alarming and canister full today.       HPI: Patient is a 60 year old gentleman who presents 5 days status post multiple debridements for a large abscess plantar aspect right foot.  The wound VAC was full and had stopped working.  Assessment & Plan: Visit Diagnoses: No diagnosis found.  Plan: We will start with dry dressing changes daily continue strict nonweightbearing on the right continue with Augmentin follow-up on Monday.  The wound is too macerated to start an appointment dressing change at this time anticipate we can start Silvadene on Monday.  Follow-Up Instructions: Return in about 1 week (around 05/02/2021) for Follow-up Monday.   Ortho Exam  Patient is alert, oriented, no adenopathy, well-dressed, normal affect, normal respiratory effort. Examination there is significant maceration around the wound secondary to increased drainage.  The skin graft has completely incorporated there is granulation tissue no abscess no necrotic drainage.  Patient still has a large soft tissue defect and may require further skin grafting.  Imaging: No results found. No images are attached to the encounter.  Labs: Lab Results  Component Value Date   HGBA1C 10.9 (H) 04/14/2021   HGBA1C 10.5 (H) 02/07/2021   HGBA1C 10.6 (H) 02/07/2021   ESRSEDRATE 108 (H) 04/08/2021   ESRSEDRATE 28 (H) 01/14/2016   CRP 27.2 (H) 04/08/2021   LABURIC 7.0 01/14/2016   REPTSTATUS 04/23/2021 FINAL 04/18/2021   GRAMSTAIN NO WBC SEEN NO ORGANISMS SEEN  04/18/2021   CULT  04/18/2021    RARE PROTEUS  MIRABILIS WITHIN MIXED ORGANISMS NO ANAEROBES ISOLATED Performed at [facility], [address] [place], [place] [address]    LABORGA PROTEUS MIRABILIS 04/18/2021     Lab Results  Component Value Date   ALBUMIN 2.9 (L) 04/18/2021   ALBUMIN 2.9 (L) 04/14/2021   ALBUMIN 3.0 (L) 04/08/2021    Lab Results  Component Value Date   MG 1.8 04/18/2021   MG 1.9 04/14/2021   MG 1.6 (L) 02/09/2021   No results found for: VD25OH  No results found for: PREALBUMIN CBC EXTENDED Latest Ref Rng & Units 04/24/2021 04/23/2021 04/22/2021  WBC 4.0 - 10.5 K/uL 11.1(H) - 9.0  RBC 4.22 - 5.81 MIL/uL 3.43(L) - 3.15(L)  HGB 13.0 - 17.0 g/dL 10.7(L) 10.0(L) 9.9(L)  HCT 39.0 - 52.0 % 33.3(L) 30.4(L) 30.0(L)  PLT 150 - 400 K/uL 294 - 265  NEUTROABS 1.7 - 7.7 K/uL 7.6 - -  LYMPHSABS 0.7 - 4.0 K/uL 2.1 - -     There is no height or weight on file to calculate BMI.  Orders:  No orders of the defined types were placed in this encounter.  No orders of the defined types were placed in this encounter.    Procedures: No procedures performed  Clinical Data: No additional findings.  ROS:  All other systems negative, except as noted in the HPI. Review of Systems  Objective: Vital Signs: There were no  vitals taken for this visit.  Specialty Comments:  No specialty comments available.  PMFS History: Patient Active Problem List   Diagnosis Date Noted   Cellulitis of right foot 04/17/2021   Osteomyelitis ([facility]) 04/14/2021   Right foot infection    Diabetic foot infection ([facility]) 04/08/2021   HTN (hypertension) 04/08/2021   HLD (hyperlipidemia) 04/08/2021   Alcohol abuse    Cellulitis    Bilateral leg edema 02/07/2021   Anasarca    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 2 diabetes mellitus, limited to breakdown of skin ([facility])    Hypokalemia 01/28/2014   DM (diabetes mellitus), type 2, uncontrolled ([facility]) 01/27/2014   AKI (acute kidney injury) ([facility]) 01/27/2014    Leukocytosis 01/27/2014   Facial cellulitis 01/26/2014   Dental abscess 01/26/2014   Infected dental carries 01/26/2014   Cellulitis and abscess 01/26/2014   Past Medical History:  Diagnosis Date   Alcohol abuse    Diabetes mellitus type 2 in obese ([facility])    HTN (hypertension)     History reviewed. No pertinent family history.  Past Surgical History:  Procedure Laterality Date   I & D EXTREMITY Right 04/18/2021   Procedure: IRRIGATION AND DEBRIDEMENT OF FOOT;  Surgeon: Nadara Mustard, MD;  Location: [facility] OR;  Service: Orthopedics;  Laterality: Right;   I & D EXTREMITY Right 04/20/2021   Procedure: EXCISIONAL DEBRIDEMENT RIGHT FOOT, APPLICATION OF SKIN GRAFT;  Surgeon: Nadara Mustard, MD;  Location: [facility] OR;  Service: Orthopedics;  Laterality: Right;   Social History   Occupational History   Occupation: Curator  Tobacco Use   Smoking status: Never   Smokeless tobacco: Never  Vaping Use   Vaping Use: Never used  Substance and Sexual Activity   Alcohol use: Yes    Alcohol/week: 2.0 standard drinks    Types: 2 Cans of beer per week    Comment: Occasional   Drug use: No   Sexual activity: Not Currently    Partners: Female

## 2021-04-25 NOTE — ED Triage Notes (Signed)
Pt here with reports of not taking insulin because he is not sure how to do it. Pt reports recently being dx diabetic. CBG 112 in triage.

## 2021-04-25 NOTE — Care Management (Signed)
ED RNCM met with patient Jesse [name] to assist with HFU and insulin administration assistance instructions. Patient reports not taking insulin because of not knowing how to administer. ED RNCM and ED Pharmacist attempting to provide patient with instruction on how and when to administer, patient verbalized understanding but unable to observe return demonstration due to not having the supplies needed to the demonstration process.  RNCM will follow up tomorrow with [facility] at clinic to assist with getting patient's appointment sooner than 7/12.  Explained this to patient, he is agreeable, CM will follow up with patient tomorrow after 3:30 pm.

## 2021-04-25 NOTE — ED Notes (Signed)
Pt A&O4 at d/c, verbalized understanding of d/c instructions and follow up care

## 2021-04-26 ENCOUNTER — Telehealth: Payer: Self-pay

## 2021-04-26 NOTE — Telephone Encounter (Signed)
   Jesse [name] DOB: Apr 05, 1961 MRN: [id number]   RIDER WAIVER AND RELEASE OF LIABILITY  For purposes of improving physical access to our facilities, [place] is pleased to partner with third parties to provide [place] patients or other authorized individuals the option of convenient, on-demand ground transportation services (the Chiropractor") through use of the technology service that enables users to request on-demand ground transportation from independent third-party providers.  By opting to use and accept these [employer], I, the undersigned, hereby agree on behalf of myself, and on behalf of any minor child using the Science writer for whom I am the parent or legal guardian, as follows:  Science writer provided to me are provided by independent third-party transportation providers who are not [employer] or employees and who are unaffiliated with [employer]. [place] is neither a transportation carrier nor a common or public carrier. [place] has no control over the quality or safety of the transportation that occurs as a result of the [employer]. [place] cannot guarantee that any third-party transportation provider will complete any arranged transportation service. [place] makes no representation, warranty, or guarantee regarding the reliability, timeliness, quality, safety, suitability, or availability of any of the Transport Services or that they will be error free. I fully understand that traveling by vehicle involves risks and dangers of serious bodily injury, including permanent disability, paralysis, and death. I agree, on behalf of myself and on behalf of any minor child using the Transport Services for whom I am the parent or legal guardian, that the entire risk arising out of my use of the [employer] remains solely with me, to the maximum extent permitted under applicable law. The [employer] are provided "as  is" and "as available." [place] disclaims all representations and warranties, express, implied or statutory, not expressly set out in these terms, including the implied warranties of merchantability and fitness for a particular purpose. I hereby waive and release [facility], its agents, employees, officers, directors, representatives, insurers, attorneys, assigns, successors, subsidiaries, and affiliates from any and all past, present, or future claims, demands, liabilities, actions, causes of action, or suits of any kind directly or indirectly arising from acceptance and use of the [employer]. I further waive and release [facility] and its affiliates from all present and future liability and responsibility for any injury or death to persons or damages to property caused by or related to the use of the [employer]. I have read this Waiver and Release of Liability, and I understand the terms used in it and their legal significance. This Waiver is freely and voluntarily given with the understanding that my right (as well as the right of any minor child for whom I am the parent or legal guardian using the [employer]) to legal recourse against [place] in connection with the [employer] is knowingly surrendered in return for use of these services.   I attest that I read the consent document to Jesse [name], gave Mr. Wieck the opportunity to ask questions and answered the questions asked (if any). I affirm that Jesse [name] then provided consent for he's participation in this program.     Launa Grill

## 2021-04-26 NOTE — Telephone Encounter (Signed)
Message received from Michel Bickers, RN CM requesting an appointment for patient  at one of the community care clinics as soon as possible.  Needs instruction regarding DM and insulin administration and blood glucose monitoring.   Call placed to patient # [phone], message left with call back requested to # [phone].  Patient can be scheduled at [facility], [facility], Primary Care at [facility], Post Covid/TOC clinic- where ever he can be seen the soonest.   He can also be instructed to follow up at the [facility] next week.  He could benefit from being  scheduled to see Butch Penny, RPH/ [facility] for medication teaching after he is seen at one of the community care clinics.

## 2021-04-30 ENCOUNTER — Other Ambulatory Visit: Payer: Self-pay

## 2021-04-30 ENCOUNTER — Encounter: Payer: Self-pay | Admitting: Orthopedic Surgery

## 2021-04-30 ENCOUNTER — Ambulatory Visit (INDEPENDENT_AMBULATORY_CARE_PROVIDER_SITE_OTHER): Payer: Self-pay | Admitting: Orthopedic Surgery

## 2021-04-30 DIAGNOSIS — L97511 Non-pressure chronic ulcer of other part of right foot limited to breakdown of skin: Secondary | ICD-10-CM

## 2021-04-30 MED ORDER — OXYCODONE-ACETAMINOPHEN 5-325 MG PO TABS: 1.0000 | ORAL_TABLET | ORAL | 0 refills | Status: DC | PRN

## 2021-04-30 NOTE — Progress Notes (Signed)
Office Visit Note   Patient: Jesse [name]           Date of Birth: 1961-11-01           MRN: [id number] Visit Date: 04/30/2021              Requested by: No referring provider defined for this encounter. PCP: Pcp, No  Chief Complaint  Patient presents with   Right Foot - Routine Post Op    04/18/2021 I&D right foot       HPI: Patient is a 60 year old gentleman is seen in follow-up status post skin graft large abscess plantar aspect right foot.  Patient states he has been having some clear drainage.  Assessment & Plan: Visit Diagnoses:  1. Non-pressure chronic ulcer of other part of right foot limited to breakdown of skin (HCC)     Plan: Prescription called in for Percocet for pain he will continue with his antibiotics he will change the dressings 3 times a day with dry dressing and we will provide him with dressing supplies.  Also recommended washing his foot with soap and water daily.  Follow-Up Instructions: Return in about 1 week (around 05/07/2021).   Ortho Exam  Patient is alert, oriented, no adenopathy, well-dressed, normal affect, normal respiratory effort. Examination patient has clear drainage there is very thin layer of fibrinous tissue over a wound bed which has good granulation tissue no gangrenous changes no abscess.  The wound was debrided of the fibrinous tissue.  Imaging: No results found. No images are attached to the encounter.  Labs: Lab Results  Component Value Date   HGBA1C 10.9 (H) 04/14/2021   HGBA1C 10.5 (H) 02/07/2021   HGBA1C 10.6 (H) 02/07/2021   ESRSEDRATE 108 (H) 04/08/2021   ESRSEDRATE 28 (H) 01/14/2016   CRP 27.2 (H) 04/08/2021   LABURIC 7.0 01/14/2016   REPTSTATUS 04/23/2021 FINAL 04/18/2021   GRAMSTAIN NO WBC SEEN NO ORGANISMS SEEN  04/18/2021   CULT  04/18/2021    RARE PROTEUS MIRABILIS WITHIN MIXED ORGANISMS NO ANAEROBES ISOLATED Performed at [facility], [address], [place], [place] [address]    LABORGA  PROTEUS MIRABILIS 04/18/2021     Lab Results  Component Value Date   ALBUMIN 2.9 (L) 04/18/2021   ALBUMIN 2.9 (L) 04/14/2021   ALBUMIN 3.0 (L) 04/08/2021    Lab Results  Component Value Date   MG 1.8 04/18/2021   MG 1.9 04/14/2021   MG 1.6 (L) 02/09/2021   No results found for: VD25OH  No results found for: PREALBUMIN CBC EXTENDED Latest Ref Rng & Units 04/25/2021 04/24/2021 04/23/2021  WBC 4.0 - 10.5 K/uL 13.5(H) 11.1(H) -  RBC 4.22 - 5.81 MIL/uL 3.30(L) 3.43(L) -  HGB 13.0 - 17.0 g/dL 10.5(L) 10.7(L) 10.0(L)  HCT 39.0 - 52.0 % 32.0(L) 33.3(L) 30.4(L)  PLT 150 - 400 K/uL 274 294 -  NEUTROABS 1.7 - 7.7 K/uL 9.6(H) 7.6 -  LYMPHSABS 0.7 - 4.0 K/uL 2.0 2.1 -     There is no height or weight on file to calculate BMI.  Orders:  No orders of the defined types were placed in this encounter.  No orders of the defined types were placed in this encounter.    Procedures: No procedures performed  Clinical Data: No additional findings.  ROS:  All other systems negative, except as noted in the HPI. Review of Systems  Objective: Vital Signs: There were no vitals taken for this visit.  Specialty Comments:  No specialty  comments available.  PMFS History: Patient Active Problem List   Diagnosis Date Noted   Cellulitis of right foot 04/17/2021   Osteomyelitis (HCC) 04/14/2021   Right foot infection    Diabetic foot infection (HCC) 04/08/2021   HTN (hypertension) 04/08/2021   HLD (hyperlipidemia) 04/08/2021   Alcohol abuse    Cellulitis    Bilateral leg edema 02/07/2021   Anasarca    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 2 diabetes mellitus, limited to breakdown of skin (HCC)    Hypokalemia 01/28/2014   DM (diabetes mellitus), type 2, uncontrolled (HCC) 01/27/2014   AKI (acute kidney injury) (HCC) 01/27/2014   Leukocytosis 01/27/2014   Facial cellulitis 01/26/2014   Dental abscess 01/26/2014   Infected dental carries 01/26/2014   Cellulitis and  abscess 01/26/2014   Past Medical History:  Diagnosis Date   Alcohol abuse    Diabetes mellitus type 2 in obese (HCC)    HTN (hypertension)     History reviewed. No pertinent family history.  Past Surgical History:  Procedure Laterality Date   I & D EXTREMITY Right 04/18/2021   Procedure: IRRIGATION AND DEBRIDEMENT OF FOOT;  Surgeon: Nadara Mustard, MD;  Location: [facility];  Service: Orthopedics;  Laterality: Right;   I & D EXTREMITY Right 04/20/2021   Procedure: EXCISIONAL DEBRIDEMENT RIGHT FOOT, APPLICATION OF SKIN GRAFT;  Surgeon: Nadara Mustard, MD;  Location: [facility] OR;  Service: Orthopedics;  Laterality: Right;   Social History   Occupational History   Occupation: Curator  Tobacco Use   Smoking status: Never   Smokeless tobacco: Never  Vaping Use   Vaping Use: Never used  Substance and Sexual Activity   Alcohol use: Yes    Alcohol/week: 2.0 standard drinks    Types: 2 Cans of beer per week    Comment: Occasional   Drug use: No   Sexual activity: Not Currently    Partners: Female

## 2021-05-01 LAB — VITAMIN B1: Vitamin B1 (Thiamine): 132 nmol/L (ref 66.5–200.0)

## 2021-05-01 NOTE — Telephone Encounter (Signed)
Call placed to patient to discuss follow up.  He said he is doing better checking his blood sugars and giving the insulin since he returned to the ED for instructions. Blood sugar this morning 135.  He was anxious to see a provider prior to his appointment at [facility] 05/22/2021.    Scheduled him at [facility] 05/08/2021 @ 0930.  Provided him with the address of the clinic and explained to him that he can use [facility] [employer] if needed. He has been registered with transportation and has the phone number to call and request a ride   He had no questions/concern at this time

## 2021-05-04 ENCOUNTER — Encounter: Payer: Self-pay | Admitting: Physician Assistant

## 2021-05-04 ENCOUNTER — Ambulatory Visit (INDEPENDENT_AMBULATORY_CARE_PROVIDER_SITE_OTHER): Payer: Self-pay | Admitting: Physician Assistant

## 2021-05-04 DIAGNOSIS — L97511 Non-pressure chronic ulcer of other part of right foot limited to breakdown of skin: Secondary | ICD-10-CM

## 2021-05-04 NOTE — Progress Notes (Signed)
Office Visit Note   Patient: Jesse [name]           Date of Birth: 18-Apr-1961           MRN: [id number] Visit Date: 05/04/2021              Requested by: No referring provider defined for this encounter. PCP: Pcp, No  Chief Complaint  Patient presents with   Right Foot - Pain      HPI: Presents in follow-up today for his [place]is skin grafting on the plantar surface of his foot.  He has been putting a little weight on his foot despite being cautioned not to.  He also has admitted that he has not been as consistent with dressing changes.  Assessment & Plan: Visit Diagnoses: No diagnosis found.  Plan: Emphasized the importance of strict nonweightbearing and changing the dressings 3 times daily.  He was given dressing supplies and we can provide these for him as needed.  He will follow-up early next week for recheck  Follow-Up Instructions: No follow-ups on file.   Ortho Exam  Patient is alert, oriented, no adenopathy, well-dressed, normal affect, normal respiratory effort. Examination demonstrates 75% healthy vascular exudative tissue with about 25% fibrinous tissue.  There is noes foul odor no surrounding erythema no foul drainage no ascending cellulitis surgical staples around the periphery were harvested today Imaging: No results found. No images are attached to the encounter.  Labs: Lab Results  Component Value Date   HGBA1C 10.9 (H) 04/14/2021   HGBA1C 10.5 (H) 02/07/2021   HGBA1C 10.6 (H) 02/07/2021   ESRSEDRATE 108 (H) 04/08/2021   ESRSEDRATE 28 (H) 01/14/2016   CRP 27.2 (H) 04/08/2021   LABURIC 7.0 01/14/2016   REPTSTATUS 04/23/2021 FINAL 04/18/2021   GRAMSTAIN NO WBC SEEN NO ORGANISMS SEEN  04/18/2021   CULT  04/18/2021    RARE PROTEUS MIRABILIS WITHIN MIXED ORGANISMS NO ANAEROBES ISOLATED Performed at [facility], [address], [place], [place] [address]    LABORGA PROTEUS MIRABILIS 04/18/2021     Lab Results  Component Value Date    ALBUMIN 2.9 (L) 04/18/2021   ALBUMIN 2.9 (L) 04/14/2021   ALBUMIN 3.0 (L) 04/08/2021    Lab Results  Component Value Date   MG 1.8 04/18/2021   MG 1.9 04/14/2021   MG 1.6 (L) 02/09/2021   No results found for: VD25OH  No results found for: PREALBUMIN CBC EXTENDED Latest Ref Rng & Units 04/25/2021 04/24/2021 04/23/2021  WBC 4.0 - 10.5 K/uL 13.5(H) 11.1(H) -  RBC 4.22 - 5.81 MIL/uL 3.30(L) 3.43(L) -  HGB 13.0 - 17.0 g/dL 10.5(L) 10.7(L) 10.0(L)  HCT 39.0 - 52.0 % 32.0(L) 33.3(L) 30.4(L)  PLT 150 - 400 K/uL 274 294 -  NEUTROABS 1.7 - 7.7 K/uL 9.6(H) 7.6 -  LYMPHSABS 0.7 - 4.0 K/uL 2.0 2.1 -     There is no height or weight on file to calculate BMI.  Orders:  No orders of the defined types were placed in this encounter.  No orders of the defined types were placed in this encounter.    Procedures: No procedures performed  Clinical Data: No additional findings.  ROS:  All other systems negative, except as noted in the HPI. Review of Systems  Objective: Vital Signs: There were no vitals taken for this visit.  Specialty Comments:  No specialty comments available.  PMFS History: Patient Active Problem List   Diagnosis Date Noted   Cellulitis of right foot 04/17/2021  Osteomyelitis (HCC) 04/14/2021   Right foot infection    Diabetic foot infection (HCC) 04/08/2021   HTN (hypertension) 04/08/2021   HLD (hyperlipidemia) 04/08/2021   Alcohol abuse    Cellulitis    Bilateral leg edema 02/07/2021   Anasarca    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 2 diabetes mellitus, limited to breakdown of skin (HCC)    Hypokalemia 01/28/2014   DM (diabetes mellitus), type 2, uncontrolled (HCC) 01/27/2014   AKI (acute kidney injury) (HCC) 01/27/2014   Leukocytosis 01/27/2014   Facial cellulitis 01/26/2014   Dental abscess 01/26/2014   Infected dental carries 01/26/2014   Cellulitis and abscess 01/26/2014   Past Medical History:  Diagnosis Date   Alcohol  abuse    Diabetes mellitus type 2 in obese (HCC)    HTN (hypertension)     No family history on file.  Past Surgical History:  Procedure Laterality Date   I & D EXTREMITY Right 04/18/2021   Procedure: IRRIGATION AND DEBRIDEMENT OF FOOT;  Surgeon: Nadara Mustard, MD;  Location: [facility] OR;  Service: Orthopedics;  Laterality: Right;   I & D EXTREMITY Right 04/20/2021   Procedure: EXCISIONAL DEBRIDEMENT RIGHT FOOT, APPLICATION OF SKIN GRAFT;  Surgeon: Nadara Mustard, MD;  Location: [facility] OR;  Service: Orthopedics;  Laterality: Right;   Social History   Occupational History   Occupation: Curator  Tobacco Use   Smoking status: Never   Smokeless tobacco: Never  Vaping Use   Vaping Use: Never used  Substance and Sexual Activity   Alcohol use: Yes    Alcohol/week: 2.0 standard drinks    Types: 2 Cans of beer per week    Comment: Occasional   Drug use: No   Sexual activity: Not Currently    Partners: Female

## 2021-05-05 ENCOUNTER — Emergency Department (HOSPITAL_COMMUNITY)
Admission: EM | Admit: 2021-05-05 | Discharge: 2021-05-06 | Disposition: A | Discharge status: Home / Self Care | Payer: Self-pay | Attending: Emergency Medicine | Admitting: Emergency Medicine

## 2021-05-05 ENCOUNTER — Emergency Department (HOSPITAL_COMMUNITY): Payer: Self-pay

## 2021-05-05 ENCOUNTER — Encounter (HOSPITAL_COMMUNITY): Payer: Self-pay

## 2021-05-05 DIAGNOSIS — E1169 Type 2 diabetes mellitus with other specified complication: Secondary | ICD-10-CM | POA: Insufficient documentation

## 2021-05-05 DIAGNOSIS — R2231 Localized swelling, mass and lump, right upper limb: Secondary | ICD-10-CM | POA: Insufficient documentation

## 2021-05-05 DIAGNOSIS — Z794 Long term (current) use of insulin: Secondary | ICD-10-CM | POA: Insufficient documentation

## 2021-05-05 DIAGNOSIS — Z79899 Other long term (current) drug therapy: Secondary | ICD-10-CM | POA: Insufficient documentation

## 2021-05-05 DIAGNOSIS — L97519 Non-pressure chronic ulcer of other part of right foot with unspecified severity: Secondary | ICD-10-CM | POA: Insufficient documentation

## 2021-05-05 DIAGNOSIS — Z7984 Long term (current) use of oral hypoglycemic drugs: Secondary | ICD-10-CM | POA: Insufficient documentation

## 2021-05-05 DIAGNOSIS — M7989 Other specified soft tissue disorders: Secondary | ICD-10-CM

## 2021-05-05 DIAGNOSIS — E785 Hyperlipidemia, unspecified: Secondary | ICD-10-CM | POA: Insufficient documentation

## 2021-05-05 DIAGNOSIS — E11621 Type 2 diabetes mellitus with foot ulcer: Secondary | ICD-10-CM | POA: Insufficient documentation

## 2021-05-05 DIAGNOSIS — I10 Essential (primary) hypertension: Secondary | ICD-10-CM | POA: Insufficient documentation

## 2021-05-05 IMAGING — CR DG FINGER THUMB 2+V*R*
2 series · 2 of 2 positions shown · non-contrast
Comparison: None.

CLINICAL DATA: Right thumb swelling

EXAM:
RIGHT THUMB 2+V

[finger ap]
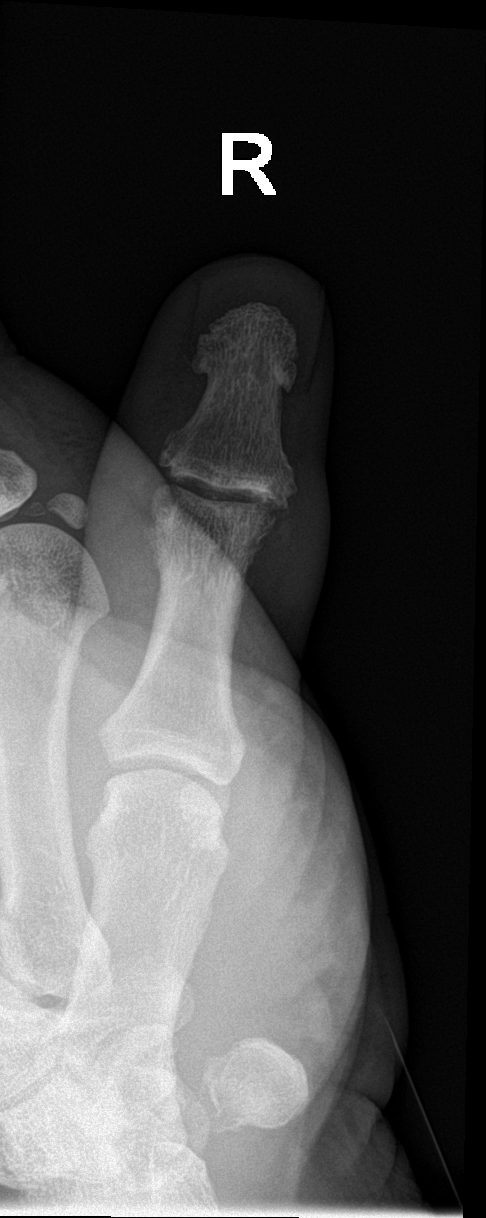

[finger lat]
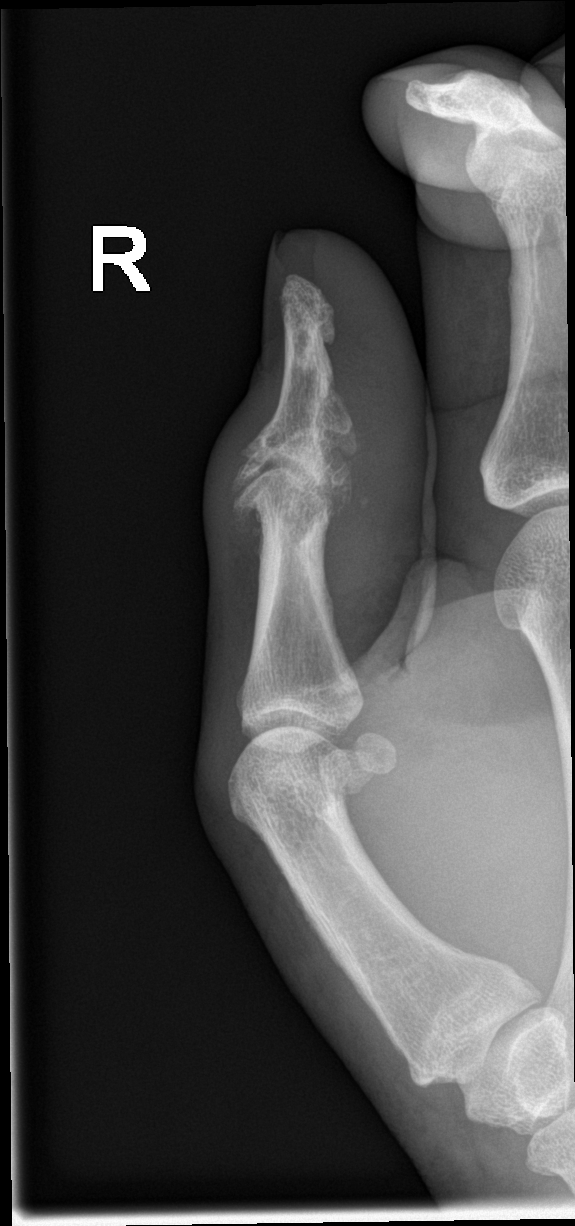

[2 of 2 positions shown; findings below may reference images not displayed]

FINDINGS: Three view radiograph right thumb demonstrates normal alignment. No
fracture or dislocation. Severe degenerate arthritis of the
interphalangeal joint of the thumb. There is extensive soft tissue
swelling of the right thumb. No superimposed osseous erosion.
IMPRESSION: Extensive soft tissue swelling of the right thumb. No fracture. No
osseous erosion.

Severe degenerative arthritis of the interphalangeal joint of the
thumb.

## 2021-05-05 NOTE — ED Triage Notes (Signed)
Pt reports that he has been having swelling to his R thumb for the past 3 weeks, denies injury or hx of gout

## 2021-05-06 NOTE — ED Provider Notes (Signed)
[facility]ERG[facility] DEPARTMENT Provider Note   CSN: [id number] Arrival date & time: 05/05/21  2050     History No chief complaint on file.   Jesse [name] is a 60 y.o. male.  HPI 60 year old male with a history of alcohol abuse, poorly controlled DM type II, hypertension, current right foot infection with graft placement, currently on Augmentin presents to the ER with complaints of right thumb swelling.  Patient states that this has been ongoing for about 3 weeks.  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  He states that he had this swelling to his thumb when he was hospitalized, it was briefly locked out but no one seemed concerned about it.  He continues to have pain to the area.  Denies any fevers or chills.  Denies any numbness or tingling.  No history of gout.  No history of IV drug use.    Past Medical History:  Diagnosis Date   Alcohol abuse    Diabetes mellitus type 2 in obese ([place])    HTN (hypertension)     Patient Active Problem List   Diagnosis Date Noted   Cellulitis of right foot 04/17/2021   Osteomyelitis ([place]) 04/14/2021   Right foot infection    Diabetic foot infection ([place]) 04/08/2021   HTN (hypertension) 04/08/2021   HLD (hyperlipidemia) 04/08/2021   Alcohol abuse    Cellulitis    Bilateral leg edema 02/07/2021   Anasarca    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 2 diabetes mellitus, limited to breakdown of skin ([place])    Hypokalemia 01/28/2014   DM (diabetes mellitus), type 2, uncontrolled ([place]) 01/27/2014   AKI (acute kidney injury) ([place]) 01/27/2014   Leukocytosis 01/27/2014   Facial cellulitis 01/26/2014   Dental abscess 01/26/2014   Infected dental carries 01/26/2014   Cellulitis and abscess 01/26/2014    Past Surgical History:  Procedure Laterality Date   I & D EXTREMITY Right 04/18/2021   Procedure: IRRIGATION AND DEBRIDEMENT OF  FOOT;  Surgeon: Newt Minion, MD;  Location: [facility];  Service: Orthopedics;  Laterality: Right;   I & D EXTREMITY Right 04/20/2021   Procedure: EXCISIONAL DEBRIDEMENT RIGHT FOOT, APPLICATION OF SKIN GRAFT;  Surgeon: Newt Minion, MD;  Location: [facility];  Service: Orthopedics;  Laterality: Right;       No family history on file.  Social History   Tobacco Use   Smoking status: Never   Smokeless tobacco: Never  Vaping Use   Vaping Use: Never used  Substance Use Topics   Alcohol use: Yes    Alcohol/week: 2.0 standard drinks    Types: 2 Cans of beer per week    Comment: Occasional   Drug use: No    Home Medications Prior to Admission medications   Medication Sig Start Date End Date Taking? Authorizing Provider  Accu-Chek Softclix Lancets lancets Use as directed. 04/20/21   Patrecia Pour, MD  amoxicillin-clavulanate (AUGMENTIN) 875-125 MG tablet Take 1 tablet by mouth every 12 (twelve) hours. 04/23/21   Patrecia Pour, MD  atorvastatin (LIPITOR) 40 MG tablet Take 1 tablet (40 mg total) by mouth daily. 04/20/21   Patrecia Pour, MD  Blood Glucose Monitoring Suppl (ACCU-CHEK GUIDE) w/Device KIT Use as directed 04/20/21   Patrecia Pour, MD  glucose blood (ACCU-CHEK GUIDE) test strip Use as instructed 04/20/21   Vance Gather  B, MD  insulin glargine (LANTUS) 100 UNIT/ML Solostar Pen INJECT 10 UNITS INTO THE SKIN DAILY. 04/20/21   Patrecia Pour, MD  Insulin Pen Needle 31G X 8 MM MISC USE AS DIRECTED 02/09/21 02/09/22  Simmons-Robinson, Riki Sheer, MD  losartan (COZAAR) 25 MG tablet TAKE 1 TABLET (25 MG TOTAL) BY MOUTH DAILY. 04/20/21 04/20/22  Patrecia Pour, MD  oxyCODONE-acetaminophen (PERCOCET/ROXICET) 5-325 MG tablet Take 1 tablet by mouth every 4 (four) hours as needed for severe pain. 04/30/21   Newt Minion, MD  TRUEplus Lancets 28G MISC USE UP TO FOUR TIMES DAILY AS DIRECTED 02/09/21 02/09/22  Simmons-Robinson, Riki Sheer, MD  glipiZIDE (GLUCOTROL) 5 MG tablet Take 1 tablet (5 mg total) by mouth daily before  breakfast. Patient not taking: Reported on 01/14/2016 01/31/14 01/14/16  Delfina Redwood, MD    Allergies    Patient has no known allergies.  Review of Systems   Review of Systems  Constitutional:  Negative for fever.  Musculoskeletal:  Positive for arthralgias.   Physical Exam Updated Vital Signs BP 110/76   Pulse 73   Temp (!) 97.3 F (36.3 C) (Oral)   Resp 18   SpO2 100%   Physical Exam Vitals reviewed.  Constitutional:      Appearance: Normal appearance.  HENT:     Head: Normocephalic and atraumatic.  Eyes:     General:        Right eye: No discharge.        Left eye: No discharge.     Extraocular Movements: Extraocular movements intact.     Conjunctiva/sclera: Conjunctivae normal.  Musculoskeletal:        General: Swelling, tenderness and deformity present. Normal range of motion.     Comments: Right IP joint  of right thumb significant swelling compared to left.  Small pocket of fluctuance to the ventral aspect of the IP  joint area, but no significant warmth or drainage.  Patient able to passively flex the joint but with some pain.  <2 cap refill.  No significant swelling at the base of the thumb, full flexion and extension of the bedside ultra. Bedside ultrasound performed by Dr. Sedonia Small with no significant hypoechoic fluid to suggest gross abscess. Some cobblestoning noted   Neurological:     General: No focal deficit present.     Mental Status: He is alert and oriented to person, place, and time.  Psychiatric:        Mood and Affect: Mood normal.        Behavior: Behavior normal.    ED Results / Procedures / Treatments   Labs (all labs ordered are listed, but only abnormal results are displayed) Labs Reviewed - No data to display  EKG None  Radiology DG Finger Thumb Right  Result Date: 05/05/2021 CLINICAL DATA:  Right thumb swelling EXAM: RIGHT THUMB 2+V COMPARISON:  None. FINDINGS: Three view radiograph right thumb demonstrates normal alignment. No  fracture or dislocation. 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 There is extensive soft tissue swelling of the right thumb. No superimposed osseous erosion. IMPRESSION: Extensive soft tissue swelling of the right thumb. No fracture. No osseous erosion. 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 Electronically Signed   By: Fidela Salisbury MD   On: 05/05/2021 23:30    Procedures Procedures   Medications Ordered in ED Medications - No data to display  ED Course  I have reviewed the triage vital signs and the nursing notes.  Pertinent labs & imaging results that were available during my care of the patient were reviewed by me and considered in my medical decision making (see chart for details).    MDM Rules/Calculators/A&P                          60 year old male with swelling to the right thumb which has been ongoing for the last 3 weeks.  On arrival, he is well-appearing, no acute distress, resting comfortably in the ER bed.  Vitals reassuring, afebrile.  Right thumb with visible swelling at the IP joint, he does have preserved passive range of motion.  No significant warmth, though there is a small area of overlying erythema with some fluctuance.  Bedside ultrasound performed by Dr. Sedonia Small , with no significant hypoechoic to suggest abscess with trace cobblestoning.  X-ray shows signs of soft tissue swelling and severe arthritis.  Dr. Sedonia Small and I did review the x-ray, there does appear to be some bony changes.  I discussed the patient with Dr. Griffin Basil the on-call hand surgeon, he unfortunately was not able to look at the x-ray but felt that given the chronicity of the patient's presentation, he was safe to follow-up with the hand clinic as a new patient.  Patient is already on Augmentin for the ulcer on his right foot.  Low suspicion for septic joint at this time.  Patient was informed to call the phone number on his discharge paperwork and  schedule an appointment with the hand clinic.  We discussed return precautions.  He voiced understanding and is agreeable.  Stable for discharge.    This was a shared visit with my supervising physician Dr. Sedonia Small who independently saw and evaluated the patient & provided guidance in evaluation/management/disposition ,in agreement with care   Final Clinical Impression(s) / ED Diagnoses Final diagnoses:  Swelling of right thumb    Rx / DC Orders ED Discharge Orders     None        Garald Balding, PA-C 05/06/21 [id number]    Maudie Flakes, MD 05/06/21 [phone]

## 2021-05-06 NOTE — ED Provider Notes (Signed)
Ultrasound ED Soft Tissue  Date/Time: 05/06/2021 8:35 AM Performed by: Sabas Sous, MD Authorized by: Sabas Sous, MD   Procedure details:    Indications: limb pain, localization of abscess and evaluate for cellulitis     Transverse view:  Visualized   Longitudinal view:  Visualized   Images: archived   Location:    Location comment:  Thumb   Side:  Right Comments:     Heterogeneous appearance of tissues surrounding the IP joint of the right thumb.  No obvious hypoechoic fluid presents.  Minimal cobblestoning identified.    Sabas Sous, MD 05/06/21 [phone]

## 2021-05-06 NOTE — Discharge Instructions (Addendum)
Please call the phone number in your discharge paperwork tomorrow and schedule an appointment with the hand center.  Continue taking your antibiotics as directed.  Return to the ER for any new or worsening symptoms.

## 2021-05-07 ENCOUNTER — Ambulatory Visit (INDEPENDENT_AMBULATORY_CARE_PROVIDER_SITE_OTHER): Payer: Self-pay | Admitting: Physician Assistant

## 2021-05-07 ENCOUNTER — Encounter: Payer: Self-pay | Admitting: Orthopedic Surgery

## 2021-05-07 DIAGNOSIS — L97511 Non-pressure chronic ulcer of other part of right foot limited to breakdown of skin: Secondary | ICD-10-CM

## 2021-05-07 NOTE — Progress Notes (Signed)
Office Visit Note   Patient: Jesse [name]           Date of Birth: April 20, 1961           MRN: [id number] Visit Date: 05/07/2021              Requested by: No referring provider defined for this encounter. PCP: Pcp, No  Chief Complaint  Patient presents with   Right Foot - Routine Post Op    04/20/21 right foot I&D kerecis graft       HPI: Patient is in follow-up for his plantar foot wound.  He is status post Kerecis graft grafting.  He has had fairly vigorous bleeding from the area.  This seems to have slowed down slightly.  He is concerned that he tends to have a little bit more swelling in the great toe.  Assessment & Plan: Visit Diagnoses: No diagnosis found.  Plan: Patient will continue with dressing changes follow-up later this week.  Follow-Up Instructions: No follow-ups on file.   Ortho Exam  Patient is alert, oriented, no adenopathy, well-dressed, normal affect, normal respiratory effort. He has palpable dorsalis pedis pulses.  Great toe has some swelling as to the second and third toe but no erythema no open ulcers no ascending cellulitis Examination of the wound he has about 70% healthy exudative tissue with 30% fibrinous tissue.  He does have fairly brisk bleeding when the dressing is initially removed picture below no ascending cellulitis. There is some skin maceration  Imaging: No results found.   Labs: Lab Results  Component Value Date   HGBA1C 10.9 (H) 04/14/2021   HGBA1C 10.5 (H) 02/07/2021   HGBA1C 10.6 (H) 02/07/2021   ESRSEDRATE 108 (H) 04/08/2021   ESRSEDRATE 28 (H) 01/14/2016   CRP 27.2 (H) 04/08/2021   LABURIC 7.0 01/14/2016   REPTSTATUS 04/23/2021 FINAL 04/18/2021   GRAMSTAIN NO WBC SEEN NO ORGANISMS SEEN  04/18/2021   CULT  04/18/2021    RARE PROTEUS MIRABILIS WITHIN MIXED ORGANISMS NO ANAEROBES ISOLATED Performed at [facility], [address] [place], [place] [address]    LABORGA PROTEUS MIRABILIS 04/18/2021     Lab  Results  Component Value Date   ALBUMIN 2.9 (L) 04/18/2021   ALBUMIN 2.9 (L) 04/14/2021   ALBUMIN 3.0 (L) 04/08/2021    Lab Results  Component Value Date   MG 1.8 04/18/2021   MG 1.9 04/14/2021   MG 1.6 (L) 02/09/2021   No results found for: VD25OH  No results found for: PREALBUMIN CBC EXTENDED Latest Ref Rng & Units 04/25/2021 04/24/2021 04/23/2021  WBC 4.0 - 10.5 K/uL 13.5(H) 11.1(H) -  RBC 4.22 - 5.81 MIL/uL 3.30(L) 3.43(L) -  HGB 13.0 - 17.0 g/dL 10.5(L) 10.7(L) 10.0(L)  HCT 39.0 - 52.0 % 32.0(L) 33.3(L) 30.4(L)  PLT 150 - 400 K/uL 274 294 -  NEUTROABS 1.7 - 7.7 K/uL 9.6(H) 7.6 -  LYMPHSABS 0.7 - 4.0 K/uL 2.0 2.1 -     There is no height or weight on file to calculate BMI.  Orders:  No orders of the defined types were placed in this encounter.  No orders of the defined types were placed in this encounter.    Procedures: No procedures performed  Clinical Data: No additional findings.  ROS:  All other systems negative, except as noted in the HPI. Review of Systems  Objective: Vital Signs: There were no vitals taken for this visit.  Specialty Comments:  No specialty comments available.  PMFS  History: Patient Active Problem List   Diagnosis Date Noted   Cellulitis of right foot 04/17/2021   Osteomyelitis (HCC) 04/14/2021   Right foot infection    Diabetic foot infection (HCC) 04/08/2021   HTN (hypertension) 04/08/2021   HLD (hyperlipidemia) 04/08/2021   Alcohol abuse    Cellulitis    Bilateral leg edema 02/07/2021   Anasarca    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 2 diabetes mellitus, limited to breakdown of skin (HCC)    Hypokalemia 01/28/2014   DM (diabetes mellitus), type 2, uncontrolled (HCC) 01/27/2014   AKI (acute kidney injury) (HCC) 01/27/2014   Leukocytosis 01/27/2014   Facial cellulitis 01/26/2014   Dental abscess 01/26/2014   Infected dental carries 01/26/2014   Cellulitis and abscess 01/26/2014   Past Medical  History:  Diagnosis Date   Alcohol abuse    Diabetes mellitus type 2 in obese (HCC)    HTN (hypertension)     No family history on file.  Past Surgical History:  Procedure Laterality Date   I & D EXTREMITY Right 04/18/2021   Procedure: IRRIGATION AND DEBRIDEMENT OF FOOT;  Surgeon: Nadara Mustard, MD;  Location: [facility] OR;  Service: Orthopedics;  Laterality: Right;   I & D EXTREMITY Right 04/20/2021   Procedure: EXCISIONAL DEBRIDEMENT RIGHT FOOT, APPLICATION OF SKIN GRAFT;  Surgeon: Nadara Mustard, MD;  Location: [facility] OR;  Service: Orthopedics;  Laterality: Right;   Social History   Occupational History   Occupation: Curator  Tobacco Use   Smoking status: Never   Smokeless tobacco: Never  Vaping Use   Vaping Use: Never used  Substance and Sexual Activity   Alcohol use: Yes    Alcohol/week: 2.0 standard drinks    Types: 2 Cans of beer per week    Comment: Occasional   Drug use: No   Sexual activity: Not Currently    Partners: Female

## 2021-05-08 ENCOUNTER — Ambulatory Visit: Payer: Self-pay

## 2021-05-08 ENCOUNTER — Encounter: Payer: Self-pay | Admitting: Family

## 2021-05-10 ENCOUNTER — Ambulatory Visit (INDEPENDENT_AMBULATORY_CARE_PROVIDER_SITE_OTHER): Payer: Self-pay | Admitting: Orthopedic Surgery

## 2021-05-10 ENCOUNTER — Encounter: Payer: Self-pay | Admitting: Orthopedic Surgery

## 2021-05-10 DIAGNOSIS — L97511 Non-pressure chronic ulcer of other part of right foot limited to breakdown of skin: Secondary | ICD-10-CM

## 2021-05-15 ENCOUNTER — Encounter: Payer: Self-pay | Admitting: Orthopedic Surgery

## 2021-05-15 NOTE — Progress Notes (Signed)
Office Visit Note   Patient: Jesse [name]           Date of Birth: 1961/02/25           MRN: [id number] Visit Date: 05/10/2021              Requested by: No referring provider defined for this encounter. PCP: Pcp, No  Chief Complaint  Patient presents with   Right Foot - Routine Post Op    04/20/21 right foot I&D Kerecis       HPI: Patient is a 60 year old gentleman who is 2 weeks status post irrigation and debridement large foot abscess ulceration and application of Kerecis tissue graft.  Patient states he is currently on Augmentin he has not changed the dressing in 3 days.  Assessment & Plan: Visit Diagnoses:  1. Non-pressure chronic ulcer of other part of right foot limited to breakdown of skin (HCC)     Plan: Recommended patient wash his foot with soap and water in the shower and change the 4 x 4 plus Ace wrap dressing daily recommended nonweightbearing on the right foot.  Follow-Up Instructions: Return in about 2 weeks (around 05/24/2021).   Ortho Exam  Patient is alert, oriented, no adenopathy, well-dressed, normal affect, normal respiratory effort. Examination patient has increased maceration and fibrinous exudative tissue.  There is no exposed bone or tendon.  The fibrinous tissue was debrided 4 x 4 and Ace wrap was applied.  Will start with dry dressing changes due to the increased drainage and periwound maceration.  Imaging: No results found. No images are attached to the encounter.  Labs: Lab Results  Component Value Date   HGBA1C 10.9 (H) 04/14/2021   HGBA1C 10.5 (H) 02/07/2021   HGBA1C 10.6 (H) 02/07/2021   ESRSEDRATE 108 (H) 04/08/2021   ESRSEDRATE 28 (H) 01/14/2016   CRP 27.2 (H) 04/08/2021   LABURIC 7.0 01/14/2016   REPTSTATUS 04/23/2021 FINAL 04/18/2021   GRAMSTAIN NO WBC SEEN NO ORGANISMS SEEN  04/18/2021   CULT  04/18/2021    RARE PROTEUS MIRABILIS WITHIN MIXED ORGANISMS NO ANAEROBES ISOLATED Performed at [facility], [address], [place], [place] [address]    LABORGA PROTEUS MIRABILIS 04/18/2021     Lab Results  Component Value Date   ALBUMIN 2.9 (L) 04/18/2021   ALBUMIN 2.9 (L) 04/14/2021   ALBUMIN 3.0 (L) 04/08/2021    Lab Results  Component Value Date   MG 1.8 04/18/2021   MG 1.9 04/14/2021   MG 1.6 (L) 02/09/2021   No results found for: VD25OH  No results found for: PREALBUMIN CBC EXTENDED Latest Ref Rng & Units 04/25/2021 04/24/2021 04/23/2021  WBC 4.0 - 10.5 K/uL 13.5(H) 11.1(H) -  RBC 4.22 - 5.81 MIL/uL 3.30(L) 3.43(L) -  HGB 13.0 - 17.0 g/dL 10.5(L) 10.7(L) 10.0(L)  HCT 39.0 - 52.0 % 32.0(L) 33.3(L) 30.4(L)  PLT 150 - 400 K/uL 274 294 -  NEUTROABS 1.7 - 7.7 K/uL 9.6(H) 7.6 -  LYMPHSABS 0.7 - 4.0 K/uL 2.0 2.1 -     There is no height or weight on file to calculate BMI.  Orders:  No orders of the defined types were placed in this encounter.  No orders of the defined types were placed in this encounter.    Procedures: No procedures performed  Clinical Data: No additional findings.  ROS:  All other systems negative, except as noted in the HPI. Review of Systems  Objective: Vital Signs: There were no vitals taken for  this visit.  Specialty Comments:  No specialty comments available.  PMFS History: Patient Active Problem List   Diagnosis Date Noted   Cellulitis of right foot 04/17/2021   Osteomyelitis (HCC) 04/14/2021   Right foot infection    Diabetic foot infection (HCC) 04/08/2021   HTN (hypertension) 04/08/2021   HLD (hyperlipidemia) 04/08/2021   Alcohol abuse    Cellulitis    Bilateral leg edema 02/07/2021   Anasarca    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 2 diabetes mellitus, limited to breakdown of skin (HCC)    Hypokalemia 01/28/2014   DM (diabetes mellitus), type 2, uncontrolled (HCC) 01/27/2014   AKI (acute kidney injury) (HCC) 01/27/2014   Leukocytosis 01/27/2014   Facial cellulitis 01/26/2014   Dental abscess 01/26/2014   Infected  dental carries 01/26/2014   Cellulitis and abscess 01/26/2014   Past Medical History:  Diagnosis Date   Alcohol abuse    Diabetes mellitus type 2 in obese (HCC)    HTN (hypertension)     History reviewed. No pertinent family history.  Past Surgical History:  Procedure Laterality Date   I & D EXTREMITY Right 04/18/2021   Procedure: IRRIGATION AND DEBRIDEMENT OF FOOT;  Surgeon: Nadara Mustard, MD;  Location: [facility] OR;  Service: Orthopedics;  Laterality: Right;   I & D EXTREMITY Right 04/20/2021   Procedure: EXCISIONAL DEBRIDEMENT RIGHT FOOT, APPLICATION OF SKIN GRAFT;  Surgeon: Nadara Mustard, MD;  Location: [facility] OR;  Service: Orthopedics;  Laterality: Right;   Social History   Occupational History   Occupation: Curator  Tobacco Use   Smoking status: Never   Smokeless tobacco: Never  Vaping Use   Vaping Use: Never used  Substance and Sexual Activity   Alcohol use: Yes    Alcohol/week: 2.0 standard drinks    Types: 2 Cans of beer per week    Comment: Occasional   Drug use: No   Sexual activity: Not Currently    Partners: Female

## 2021-05-22 ENCOUNTER — Telehealth (INDEPENDENT_AMBULATORY_CARE_PROVIDER_SITE_OTHER): Payer: Self-pay | Admitting: Primary Care

## 2021-05-25 ENCOUNTER — Encounter: Payer: Self-pay | Admitting: Physician Assistant

## 2021-05-25 ENCOUNTER — Ambulatory Visit (INDEPENDENT_AMBULATORY_CARE_PROVIDER_SITE_OTHER): Payer: Self-pay | Admitting: Physician Assistant

## 2021-05-25 DIAGNOSIS — L97511 Non-pressure chronic ulcer of other part of right foot limited to breakdown of skin: Secondary | ICD-10-CM

## 2021-05-25 NOTE — Progress Notes (Signed)
Office Visit Note   Patient: Jesse [name]           Date of Birth: 12/09/1960           MRN: [id number] Visit Date: 05/25/2021              Requested by: No referring provider defined for this encounter. PCP: Pcp, No  Chief Complaint  Patient presents with   Right Foot - Follow-up      HPI: Patient is a pleasant 60 year old gentleman who comes in for follow-up  4 weeks for his Kerecis grafting of his deep foot wound.  He has been doing dry dressing changes.  He says he has been soaking the foot.  Assessment & Plan: Visit Diagnoses: No diagnosis found.  Plan: Patient should continue to wash foot every day with antibacterial soap and water.  I discouraged him from soaking the foot.  We will follow-up in 2 weeks daily dry dressing change daily  Follow-Up Instructions: No follow-ups on file.   Ortho Exam  Patient is alert, oriented, no adenopathy, well-dressed, normal affect, normal respiratory effort. Examination of his foot.  He has 80% healthy healing tissue.  There is some minimal bloody drainage towards the center of this.  He does have some exudative tissue in the very center but does not probe deeply to tendon or bone.  No surrounding cellulitis fibrinous tissue was rubbed with a 4 x 4 to produce some light bleeding.  New 4 x 4's and Ace wrap were applied.  No signs of cellulitis  Imaging: No results found. No images are attached to the encounter.  Labs: Lab Results  Component Value Date   HGBA1C 10.9 (H) 04/14/2021   HGBA1C 10.5 (H) 02/07/2021   HGBA1C 10.6 (H) 02/07/2021   ESRSEDRATE 108 (H) 04/08/2021   ESRSEDRATE 28 (H) 01/14/2016   CRP 27.2 (H) 04/08/2021   LABURIC 7.0 01/14/2016   REPTSTATUS 04/23/2021 FINAL 04/18/2021   GRAMSTAIN NO WBC SEEN NO ORGANISMS SEEN  04/18/2021   CULT  04/18/2021    RARE PROTEUS MIRABILIS WITHIN MIXED ORGANISMS NO ANAEROBES ISOLATED Performed at [facility], [address], [place], [place] [address]    LABORGA  PROTEUS MIRABILIS 04/18/2021     Lab Results  Component Value Date   ALBUMIN 2.9 (L) 04/18/2021   ALBUMIN 2.9 (L) 04/14/2021   ALBUMIN 3.0 (L) 04/08/2021    Lab Results  Component Value Date   MG 1.8 04/18/2021   MG 1.9 04/14/2021   MG 1.6 (L) 02/09/2021   No results found for: VD25OH  No results found for: PREALBUMIN CBC EXTENDED Latest Ref Rng & Units 04/25/2021 04/24/2021 04/23/2021  WBC 4.0 - 10.5 K/uL 13.5(H) 11.1(H) -  RBC 4.22 - 5.81 MIL/uL 3.30(L) 3.43(L) -  HGB 13.0 - 17.0 g/dL 10.5(L) 10.7(L) 10.0(L)  HCT 39.0 - 52.0 % 32.0(L) 33.3(L) 30.4(L)  PLT 150 - 400 K/uL 274 294 -  NEUTROABS 1.7 - 7.7 K/uL 9.6(H) 7.6 -  LYMPHSABS 0.7 - 4.0 K/uL 2.0 2.1 -     There is no height or weight on file to calculate BMI.  Orders:  No orders of the defined types were placed in this encounter.  No orders of the defined types were placed in this encounter.    Procedures: No procedures performed  Clinical Data: No additional findings.  ROS:  All other systems negative, except as noted in the HPI. Review of Systems  Objective: Vital Signs: There were no vitals  taken for this visit.  Specialty Comments:  No specialty comments available.  PMFS History: Patient Active Problem List   Diagnosis Date Noted   Cellulitis of right foot 04/17/2021   Osteomyelitis (HCC) 04/14/2021   Right foot infection    Diabetic foot infection (HCC) 04/08/2021   HTN (hypertension) 04/08/2021   HLD (hyperlipidemia) 04/08/2021   Alcohol abuse    Cellulitis    Bilateral leg edema 02/07/2021   Anasarca    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 2 diabetes mellitus, limited to breakdown of skin (HCC)    Hypokalemia 01/28/2014   DM (diabetes mellitus), type 2, uncontrolled (HCC) 01/27/2014   AKI (acute kidney injury) (HCC) 01/27/2014   Leukocytosis 01/27/2014   Facial cellulitis 01/26/2014   Dental abscess 01/26/2014   Infected dental carries 01/26/2014   Cellulitis and  abscess 01/26/2014   Past Medical History:  Diagnosis Date   Alcohol abuse    Diabetes mellitus type 2 in obese (HCC)    HTN (hypertension)     No family history on file.  Past Surgical History:  Procedure Laterality Date   I & D EXTREMITY Right 04/18/2021   Procedure: IRRIGATION AND DEBRIDEMENT OF FOOT;  Surgeon: Nadara Mustard, MD;  Location: [facility] OR;  Service: Orthopedics;  Laterality: Right;   I & D EXTREMITY Right 04/20/2021   Procedure: EXCISIONAL DEBRIDEMENT RIGHT FOOT, APPLICATION OF SKIN GRAFT;  Surgeon: Nadara Mustard, MD;  Location: [facility] OR;  Service: Orthopedics;  Laterality: Right;   Social History   Occupational History   Occupation: Curator  Tobacco Use   Smoking status: Never   Smokeless tobacco: Never  Vaping Use   Vaping Use: Never used  Substance and Sexual Activity   Alcohol use: Yes    Alcohol/week: 2.0 standard drinks    Types: 2 Cans of beer per week    Comment: Occasional   Drug use: No   Sexual activity: Not Currently    Partners: Female

## 2021-05-31 ENCOUNTER — Other Ambulatory Visit: Payer: Self-pay

## 2021-05-31 ENCOUNTER — Telehealth (INDEPENDENT_AMBULATORY_CARE_PROVIDER_SITE_OTHER): Payer: Self-pay | Admitting: Primary Care

## 2021-05-31 ENCOUNTER — Ambulatory Visit (INDEPENDENT_AMBULATORY_CARE_PROVIDER_SITE_OTHER): Payer: Self-pay | Admitting: Orthopedic Surgery

## 2021-05-31 ENCOUNTER — Telehealth: Payer: Self-pay

## 2021-05-31 DIAGNOSIS — L97511 Non-pressure chronic ulcer of other part of right foot limited to breakdown of skin: Secondary | ICD-10-CM

## 2021-05-31 NOTE — Telephone Encounter (Signed)
Message to Ailene Ravel for epicord graft deliver prior to appt in 2 weeks. S/p excisional debridement right foot and application of kerecis graft 04/20/21

## 2021-06-05 ENCOUNTER — Telehealth: Payer: Self-pay

## 2021-06-05 NOTE — Telephone Encounter (Signed)
I called pt and advised that he should wear the sock with direct contact to the skin. If he is having drainage he should apply 4x4 and an ace bandage on the outside of the sock like we did in the office at his last visit. Advised not to put a barrier between the sock and the wound it needs direct contact. To call with any questions.

## 2021-06-05 NOTE — Telephone Encounter (Signed)
Patient would like a call back to discuss if he should put his sock back on.  Stated that when he removed his sock, his foot was bleeding, so he used a Band-Aid and then it stopped.  Cb# [phone].  Please advise.  Thank you.

## 2021-06-06 NOTE — Telephone Encounter (Signed)
Should receive epicord graft tomorrow fed ex delivery

## 2021-06-07 ENCOUNTER — Ambulatory Visit (INDEPENDENT_AMBULATORY_CARE_PROVIDER_SITE_OTHER): Payer: Self-pay | Admitting: Orthopedic Surgery

## 2021-06-07 ENCOUNTER — Other Ambulatory Visit: Payer: Self-pay

## 2021-06-07 DIAGNOSIS — L97511 Non-pressure chronic ulcer of other part of right foot limited to breakdown of skin: Secondary | ICD-10-CM

## 2021-06-11 ENCOUNTER — Encounter: Payer: Self-pay | Admitting: Orthopedic Surgery

## 2021-06-11 ENCOUNTER — Ambulatory Visit (INDEPENDENT_AMBULATORY_CARE_PROVIDER_SITE_OTHER): Payer: Self-pay | Admitting: Physician Assistant

## 2021-06-11 DIAGNOSIS — L97511 Non-pressure chronic ulcer of other part of right foot limited to breakdown of skin: Secondary | ICD-10-CM

## 2021-06-11 NOTE — Progress Notes (Signed)
Office Visit Note   Patient: Jesse [name]           Date of Birth: 02-23-1961           MRN: [id number] Visit Date: 06/11/2021              Requested by: No referring provider defined for this encounter. PCP: Pcp, No  Chief Complaint  Patient presents with   Right Foot - Pain      HPI: Patient is a pleasant gentleman who is status post 1 week Kerecis grafting to the bottom of his foot.  He presents today because the wound VAC filled up and he also had a fall.  Assessment & Plan: Visit Diagnoses: No diagnosis found.  Plan: Patient will begin daily cleansing with antibacterial soap and water.  Should not soak foot.  We will use 4 x 4's and Ace wrap.  Follow-up in 1 week.  Follow-Up Instructions: No follow-ups on file.   Ortho Exam  Patient is alert, oriented, no adenopathy, well-dressed, normal affect, normal respiratory effort. Image below good healthy granular tissue throughout the wound.  No surrounding cellulitis cellulitis moderate soft tissue swelling some skin maceration around the edges.  No cellulitis or signs of infection  Imaging: No results found.   Labs: Lab Results  Component Value Date   HGBA1C 10.9 (H) 04/14/2021   HGBA1C 10.5 (H) 02/07/2021   HGBA1C 10.6 (H) 02/07/2021   ESRSEDRATE 108 (H) 04/08/2021   ESRSEDRATE 28 (H) 01/14/2016   CRP 27.2 (H) 04/08/2021   LABURIC 7.0 01/14/2016   REPTSTATUS 04/23/2021 FINAL 04/18/2021   GRAMSTAIN NO WBC SEEN NO ORGANISMS SEEN  04/18/2021   CULT  04/18/2021    RARE PROTEUS MIRABILIS WITHIN MIXED ORGANISMS NO ANAEROBES ISOLATED Performed at [facility], [address], [place], [place] [address]    LABORGA PROTEUS MIRABILIS 04/18/2021     Lab Results  Component Value Date   ALBUMIN 2.9 (L) 04/18/2021   ALBUMIN 2.9 (L) 04/14/2021   ALBUMIN 3.0 (L) 04/08/2021    Lab Results  Component Value Date   MG 1.8 04/18/2021   MG 1.9 04/14/2021   MG 1.6 (L) 02/09/2021   No results found for:  VD25OH  No results found for: PREALBUMIN CBC EXTENDED Latest Ref Rng & Units 04/25/2021 04/24/2021 04/23/2021  WBC 4.0 - 10.5 K/uL 13.5(H) 11.1(H) -  RBC 4.22 - 5.81 MIL/uL 3.30(L) 3.43(L) -  HGB 13.0 - 17.0 g/dL 10.5(L) 10.7(L) 10.0(L)  HCT 39.0 - 52.0 % 32.0(L) 33.3(L) 30.4(L)  PLT 150 - 400 K/uL 274 294 -  NEUTROABS 1.7 - 7.7 K/uL 9.6(H) 7.6 -  LYMPHSABS 0.7 - 4.0 K/uL 2.0 2.1 -     There is no height or weight on file to calculate BMI.  Orders:  No orders of the defined types were placed in this encounter.  No orders of the defined types were placed in this encounter.    Procedures: No procedures performed  Clinical Data: No additional findings.  ROS:  All other systems negative, except as noted in the HPI. Review of Systems  Objective: Vital Signs: There were no vitals taken for this visit.  Specialty Comments:  No specialty comments available.  PMFS History: Patient Active Problem List   Diagnosis Date Noted   Cellulitis of right foot 04/17/2021   Osteomyelitis (HCC) 04/14/2021   Right foot infection    Diabetic foot infection (HCC) 04/08/2021   HTN (hypertension) 04/08/2021   HLD (hyperlipidemia) 04/08/2021  Alcohol abuse    Cellulitis    Bilateral leg edema 02/07/2021   Anasarca    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 2 diabetes mellitus, limited to breakdown of skin (HCC)    Hypokalemia 01/28/2014   DM (diabetes mellitus), type 2, uncontrolled (HCC) 01/27/2014   AKI (acute kidney injury) (HCC) 01/27/2014   Leukocytosis 01/27/2014   Facial cellulitis 01/26/2014   Dental abscess 01/26/2014   Infected dental carries 01/26/2014   Cellulitis and abscess 01/26/2014   Past Medical History:  Diagnosis Date   Alcohol abuse    Diabetes mellitus type 2 in obese (HCC)    HTN (hypertension)     History reviewed. No pertinent family history.  Past Surgical History:  Procedure Laterality Date   I & D EXTREMITY Right 04/18/2021    Procedure: IRRIGATION AND DEBRIDEMENT OF FOOT;  Surgeon: Nadara Mustard, MD;  Location: [facility] OR;  Service: Orthopedics;  Laterality: Right;   I & D EXTREMITY Right 04/20/2021   Procedure: EXCISIONAL DEBRIDEMENT RIGHT FOOT, APPLICATION OF SKIN GRAFT;  Surgeon: Nadara Mustard, MD;  Location: [facility] OR;  Service: Orthopedics;  Laterality: Right;   Social History   Occupational History   Occupation: Curator  Tobacco Use   Smoking status: Never   Smokeless tobacco: Never  Vaping Use   Vaping Use: Never used  Substance and Sexual Activity   Alcohol use: Yes    Alcohol/week: 2.0 standard drinks    Types: 2 Cans of beer per week    Comment: Occasional   Drug use: No   Sexual activity: Not Currently    Partners: Female

## 2021-06-14 ENCOUNTER — Encounter: Payer: Self-pay | Admitting: Orthopedic Surgery

## 2021-06-14 NOTE — Progress Notes (Signed)
Office Visit Note   Patient: Jesse [name]           Date of Birth: 1961/09/23           MRN: [id number] Visit Date: 06/07/2021              Requested by: No referring provider defined for this encounter. PCP: Pcp, No  Chief Complaint  Patient presents with   Right Foot - Routine Post Op    04/20/21 excisional debridement kerecis graft application. In office epicord application and vac today       HPI: Patient is a 59 year old gentleman presents in follow-up for foot salvage intervention for debridement of a massive plantar ulcer.  Patient's wound healing has stalled and he presents at this time for evaluation for application of epi cord.  Assessment & Plan: Visit Diagnoses:  1. Non-pressure chronic ulcer of other part of right foot limited to breakdown of skin (HCC)     Plan: The epi cord was applied 3 pieces.  Wound VAC was applied patient will be nonweightbearing follow-up in 1 week.  Follow-Up Instructions: Return in about 1 week (around 06/14/2021).   Ortho Exam  Patient is alert, oriented, no adenopathy, well-dressed, normal affect, normal respiratory effort. Examination patient has a 3 x 5 cm wound plantar aspect of his foot it is flat there is no odor no drainage no cellulitis no signs of infection.  After informed consent the wound was debrided 3 pieces of epi cord were applied total wound surface area 3 x 5 cm.  A cleanse choice wound VAC was applied this had a good suction fit.  Imaging: No results found.     Labs: Lab Results  Component Value Date   HGBA1C 10.9 (H) 04/14/2021   HGBA1C 10.5 (H) 02/07/2021   HGBA1C 10.6 (H) 02/07/2021   ESRSEDRATE 108 (H) 04/08/2021   ESRSEDRATE 28 (H) 01/14/2016   CRP 27.2 (H) 04/08/2021   LABURIC 7.0 01/14/2016   REPTSTATUS 04/23/2021 FINAL 04/18/2021   GRAMSTAIN NO WBC SEEN NO ORGANISMS SEEN  04/18/2021   CULT  04/18/2021    RARE PROTEUS MIRABILIS WITHIN MIXED ORGANISMS NO ANAEROBES ISOLATED Performed at [facility], [address], [place], [place] [address]    LABORGA PROTEUS MIRABILIS 04/18/2021     Lab Results  Component Value Date   ALBUMIN 2.9 (L) 04/18/2021   ALBUMIN 2.9 (L) 04/14/2021   ALBUMIN 3.0 (L) 04/08/2021    Lab Results  Component Value Date   MG 1.8 04/18/2021   MG 1.9 04/14/2021   MG 1.6 (L) 02/09/2021   No results found for: VD25OH  No results found for: PREALBUMIN CBC EXTENDED Latest Ref Rng & Units 04/25/2021 04/24/2021 04/23/2021  WBC 4.0 - 10.5 K/uL 13.5(H) 11.1(H) -  RBC 4.22 - 5.81 MIL/uL 3.30(L) 3.43(L) -  HGB 13.0 - 17.0 g/dL 10.5(L) 10.7(L) 10.0(L)  HCT 39.0 - 52.0 % 32.0(L) 33.3(L) 30.4(L)  PLT 150 - 400 K/uL 274 294 -  NEUTROABS 1.7 - 7.7 K/uL 9.6(H) 7.6 -  LYMPHSABS 0.7 - 4.0 K/uL 2.0 2.1 -     There is no height or weight on file to calculate BMI.  Orders:  No orders of the defined types were placed in this encounter.  No orders of the defined types were placed in this encounter.    Procedures: No procedures performed  Clinical Data: No additional findings.  ROS:  All other systems negative, except as noted in the HPI. Review  of Systems  Objective: Vital Signs: There were no vitals taken for this visit.  Specialty Comments:  No specialty comments available.  PMFS History: Patient Active Problem List   Diagnosis Date Noted   Cellulitis of right foot 04/17/2021   Osteomyelitis (HCC) 04/14/2021   Right foot infection    Diabetic foot infection (HCC) 04/08/2021   HTN (hypertension) 04/08/2021   HLD (hyperlipidemia) 04/08/2021   Alcohol abuse    Cellulitis    Bilateral leg edema 02/07/2021   Anasarca    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 2 diabetes mellitus, limited to breakdown of skin (HCC)    Hypokalemia 01/28/2014   DM (diabetes mellitus), type 2, uncontrolled (HCC) 01/27/2014   AKI (acute kidney injury) (HCC) 01/27/2014   Leukocytosis 01/27/2014   Facial cellulitis 01/26/2014   Dental  abscess 01/26/2014   Infected dental carries 01/26/2014   Cellulitis and abscess 01/26/2014   Past Medical History:  Diagnosis Date   Alcohol abuse    Diabetes mellitus type 2 in obese (HCC)    HTN (hypertension)     History reviewed. No pertinent family history.  Past Surgical History:  Procedure Laterality Date   I & D EXTREMITY Right 04/18/2021   Procedure: IRRIGATION AND DEBRIDEMENT OF FOOT;  Surgeon: Nadara Mustard, MD;  Location: [facility] OR;  Service: Orthopedics;  Laterality: Right;   I & D EXTREMITY Right 04/20/2021   Procedure: EXCISIONAL DEBRIDEMENT RIGHT FOOT, APPLICATION OF SKIN GRAFT;  Surgeon: Nadara Mustard, MD;  Location: [facility] OR;  Service: Orthopedics;  Laterality: Right;   Social History   Occupational History   Occupation: Curator  Tobacco Use   Smoking status: Never   Smokeless tobacco: Never  Vaping Use   Vaping Use: Never used  Substance and Sexual Activity   Alcohol use: Yes    Alcohol/week: 2.0 standard drinks    Types: 2 Cans of beer per week    Comment: Occasional   Drug use: No   Sexual activity: Not Currently    Partners: Female

## 2021-06-18 ENCOUNTER — Other Ambulatory Visit: Payer: Self-pay

## 2021-06-18 ENCOUNTER — Ambulatory Visit (INDEPENDENT_AMBULATORY_CARE_PROVIDER_SITE_OTHER): Payer: Self-pay | Admitting: Physician Assistant

## 2021-06-18 ENCOUNTER — Encounter: Payer: Self-pay | Admitting: Physician Assistant

## 2021-06-18 DIAGNOSIS — L97511 Non-pressure chronic ulcer of other part of right foot limited to breakdown of skin: Secondary | ICD-10-CM

## 2021-06-18 NOTE — Progress Notes (Signed)
Office Visit Note   Patient: Jesse [name]           Date of Birth: 1961-10-30           MRN: [id number] Visit Date: 06/18/2021              Requested by: No referring provider defined for this encounter. PCP: Pcp, No  No chief complaint on file.     HPI: Patient is here to follow-up on his skin graft for his right foot.  He is 2 weeks status post Epicort application.  Assessment & Plan: Visit Diagnoses: No diagnosis found.  Plan: Patient was placed in a size extra-large vive compression sock.  I discussed with him changing this daily and have given him instructions as to where to obtain another sock and how to clean them.  I placed a light dressing over the top.  Follow-Up Instructions: No follow-ups on file.   Ortho Exam  Patient is alert, oriented, no adenopathy, well-dressed, normal affect, normal respiratory effort. Examination wound has 95% good granulation tissue there is just some fibrinous tissue in about 1/2 cm area in the central portion.  It is at skin level and a few portions of it were above skin level.  No surrounding erythema no cellulitis or signs of infection  Imaging: No results found. No images are attached to the encounter.  Labs: Lab Results  Component Value Date   HGBA1C 10.9 (H) 04/14/2021   HGBA1C 10.5 (H) 02/07/2021   HGBA1C 10.6 (H) 02/07/2021   ESRSEDRATE 108 (H) 04/08/2021   ESRSEDRATE 28 (H) 01/14/2016   CRP 27.2 (H) 04/08/2021   LABURIC 7.0 01/14/2016   REPTSTATUS 04/23/2021 FINAL 04/18/2021   GRAMSTAIN NO WBC SEEN NO ORGANISMS SEEN  04/18/2021   CULT  04/18/2021    RARE PROTEUS MIRABILIS WITHIN MIXED ORGANISMS NO ANAEROBES ISOLATED Performed at [facility], [address] [place], [place] [address]    LABORGA PROTEUS MIRABILIS 04/18/2021     Lab Results  Component Value Date   ALBUMIN 2.9 (L) 04/18/2021   ALBUMIN 2.9 (L) 04/14/2021   ALBUMIN 3.0 (L) 04/08/2021    Lab Results  Component Value Date   MG 1.8  04/18/2021   MG 1.9 04/14/2021   MG 1.6 (L) 02/09/2021   No results found for: VD25OH  No results found for: PREALBUMIN CBC EXTENDED Latest Ref Rng & Units 04/25/2021 04/24/2021 04/23/2021  WBC 4.0 - 10.5 K/uL 13.5(H) 11.1(H) -  RBC 4.22 - 5.81 MIL/uL 3.30(L) 3.43(L) -  HGB 13.0 - 17.0 g/dL 10.5(L) 10.7(L) 10.0(L)  HCT 39.0 - 52.0 % 32.0(L) 33.3(L) 30.4(L)  PLT 150 - 400 K/uL 274 294 -  NEUTROABS 1.7 - 7.7 K/uL 9.6(H) 7.6 -  LYMPHSABS 0.7 - 4.0 K/uL 2.0 2.1 -     There is no height or weight on file to calculate BMI.  Orders:  No orders of the defined types were placed in this encounter.  No orders of the defined types were placed in this encounter.    Procedures: No procedures performed  Clinical Data: No additional findings.  ROS:  All other systems negative, except as noted in the HPI. Review of Systems  Objective: Vital Signs: There were no vitals taken for this visit.  Specialty Comments:  No specialty comments available.  PMFS History: Patient Active Problem List   Diagnosis Date Noted   Cellulitis of right foot 04/17/2021   Osteomyelitis (HCC) 04/14/2021   Right foot infection  Diabetic foot infection (HCC) 04/08/2021   HTN (hypertension) 04/08/2021   HLD (hyperlipidemia) 04/08/2021   Alcohol abuse    Cellulitis    Bilateral leg edema 02/07/2021   Anasarca    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 2 diabetes mellitus, limited to breakdown of skin (HCC)    Hypokalemia 01/28/2014   DM (diabetes mellitus), type 2, uncontrolled (HCC) 01/27/2014   AKI (acute kidney injury) (HCC) 01/27/2014   Leukocytosis 01/27/2014   Facial cellulitis 01/26/2014   Dental abscess 01/26/2014   Infected dental carries 01/26/2014   Cellulitis and abscess 01/26/2014   Past Medical History:  Diagnosis Date   Alcohol abuse    Diabetes mellitus type 2 in obese (HCC)    HTN (hypertension)     No family history on file.  Past Surgical History:   Procedure Laterality Date   I & D EXTREMITY Right 04/18/2021   Procedure: IRRIGATION AND DEBRIDEMENT OF FOOT;  Surgeon: Nadara Mustard, MD;  Location: [facility] OR;  Service: Orthopedics;  Laterality: Right;   I & D EXTREMITY Right 04/20/2021   Procedure: EXCISIONAL DEBRIDEMENT RIGHT FOOT, APPLICATION OF SKIN GRAFT;  Surgeon: Nadara Mustard, MD;  Location: [facility] OR;  Service: Orthopedics;  Laterality: Right;   Social History   Occupational History   Occupation: Curator  Tobacco Use   Smoking status: Never   Smokeless tobacco: Never  Vaping Use   Vaping Use: Never used  Substance and Sexual Activity   Alcohol use: Yes    Alcohol/week: 2.0 standard drinks    Types: 2 Cans of beer per week    Comment: Occasional   Drug use: No   Sexual activity: Not Currently    Partners: Female

## 2021-06-25 ENCOUNTER — Encounter: Payer: Self-pay | Admitting: Orthopedic Surgery

## 2021-06-25 ENCOUNTER — Ambulatory Visit (INDEPENDENT_AMBULATORY_CARE_PROVIDER_SITE_OTHER): Payer: Self-pay | Admitting: Orthopedic Surgery

## 2021-06-25 ENCOUNTER — Other Ambulatory Visit: Payer: Self-pay

## 2021-06-25 DIAGNOSIS — E1165 Type 2 diabetes mellitus with hyperglycemia: Secondary | ICD-10-CM

## 2021-06-25 DIAGNOSIS — L97511 Non-pressure chronic ulcer of other part of right foot limited to breakdown of skin: Secondary | ICD-10-CM

## 2021-06-25 NOTE — Progress Notes (Signed)
Office Visit Note   Patient: Jesse [name]           Date of Birth: Jun 10, 1961           MRN: [id number] Visit Date: 06/25/2021              Requested by: No referring provider defined for this encounter. PCP: Pcp, No  Chief Complaint  Patient presents with   Right Foot - Routine Post Op     Right Foot - Routine Post Op     04/20/21 excisional debridement kerecis graft application. In office epicord application 06/07/21         HPI: Patient is a 60 year old gentleman who presents in follow-up status post application of umbilical cord skin graft.  Patient initially underwent serial debridements and placement of Kerecis skin graft.  Patient is currently wearing a large sock with dry dressing on the outside he has completed his course of antibiotics.  Assessment & Plan: Visit Diagnoses:  1. Non-pressure chronic ulcer of other part of right foot limited to breakdown of skin (HCC)   2. Uncontrolled type 2 diabetes mellitus with hyperglycemia (HCC)     Plan: Recommend continued protective weightbearing continue with washing with soap and water daily wearing the compression sock around-the-clock.  Discussed that if the healing slows that we would place a additional piece of umbilical cord graft.  In the office.  Follow-Up Instructions: Return in about 2 weeks (around 07/09/2021).   Ortho Exam  Patient is alert, oriented, no adenopathy, well-dressed, normal affect, normal respiratory effort. Examination the wound bed is healthy there is good epithelization around the wound edges.  The wound is triangular in shape and the largest dimensions are 5 x 8 cm.  There is no cellulitis no odor no drainage.  Imaging: No results found.    Labs: Lab Results  Component Value Date   HGBA1C 10.9 (H) 04/14/2021   HGBA1C 10.5 (H) 02/07/2021   HGBA1C 10.6 (H) 02/07/2021   ESRSEDRATE 108 (H) 04/08/2021   ESRSEDRATE 28 (H) 01/14/2016   CRP 27.2 (H) 04/08/2021   LABURIC 7.0 01/14/2016    REPTSTATUS 04/23/2021 FINAL 04/18/2021   GRAMSTAIN NO WBC SEEN NO ORGANISMS SEEN  04/18/2021   CULT  04/18/2021    RARE PROTEUS MIRABILIS WITHIN MIXED ORGANISMS NO ANAEROBES ISOLATED Performed at [facility], [address], [place], [place] [address]    LABORGA PROTEUS MIRABILIS 04/18/2021     Lab Results  Component Value Date   ALBUMIN 2.9 (L) 04/18/2021   ALBUMIN 2.9 (L) 04/14/2021   ALBUMIN 3.0 (L) 04/08/2021    Lab Results  Component Value Date   MG 1.8 04/18/2021   MG 1.9 04/14/2021   MG 1.6 (L) 02/09/2021   No results found for: VD25OH  No results found for: PREALBUMIN CBC EXTENDED Latest Ref Rng & Units 04/25/2021 04/24/2021 04/23/2021  WBC 4.0 - 10.5 K/uL 13.5(H) 11.1(H) -  RBC 4.22 - 5.81 MIL/uL 3.30(L) 3.43(L) -  HGB 13.0 - 17.0 g/dL 10.5(L) 10.7(L) 10.0(L)  HCT 39.0 - 52.0 % 32.0(L) 33.3(L) 30.4(L)  PLT 150 - 400 K/uL 274 294 -  NEUTROABS 1.7 - 7.7 K/uL 9.6(H) 7.6 -  LYMPHSABS 0.7 - 4.0 K/uL 2.0 2.1 -     There is no height or weight on file to calculate BMI.  Orders:  No orders of the defined types were placed in this encounter.  No orders of the defined types were placed in this encounter.  Procedures: No procedures performed  Clinical Data: No additional findings.  ROS:  All other systems negative, except as noted in the HPI. Review of Systems  Objective: Vital Signs: There were no vitals taken for this visit.  Specialty Comments:  No specialty comments available.  PMFS History: Patient Active Problem List   Diagnosis Date Noted   Cellulitis of right foot 04/17/2021   Osteomyelitis (HCC) 04/14/2021   Right foot infection    Diabetic foot infection (HCC) 04/08/2021   HTN (hypertension) 04/08/2021   HLD (hyperlipidemia) 04/08/2021   Alcohol abuse    Cellulitis    Bilateral leg edema 02/07/2021   Anasarca    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 2 diabetes mellitus, limited to breakdown of skin (HCC)     Hypokalemia 01/28/2014   DM (diabetes mellitus), type 2, uncontrolled (HCC) 01/27/2014   AKI (acute kidney injury) (HCC) 01/27/2014   Leukocytosis 01/27/2014   Facial cellulitis 01/26/2014   Dental abscess 01/26/2014   Infected dental carries 01/26/2014   Cellulitis and abscess 01/26/2014   Past Medical History:  Diagnosis Date   Alcohol abuse    Diabetes mellitus type 2 in obese (HCC)    HTN (hypertension)     History reviewed. No pertinent family history.  Past Surgical History:  Procedure Laterality Date   I & D EXTREMITY Right 04/18/2021   Procedure: IRRIGATION AND DEBRIDEMENT OF FOOT;  Surgeon: Nadara Mustard, MD;  Location: [facility] OR;  Service: Orthopedics;  Laterality: Right;   I & D EXTREMITY Right 04/20/2021   Procedure: EXCISIONAL DEBRIDEMENT RIGHT FOOT, APPLICATION OF SKIN GRAFT;  Surgeon: Nadara Mustard, MD;  Location: [facility] OR;  Service: Orthopedics;  Laterality: Right;   Social History   Occupational History   Occupation: Curator  Tobacco Use   Smoking status: Never   Smokeless tobacco: Never  Vaping Use   Vaping Use: Never used  Substance and Sexual Activity   Alcohol use: Yes    Alcohol/week: 2.0 standard drinks    Types: 2 Cans of beer per week    Comment: Occasional   Drug use: No   Sexual activity: Not Currently    Partners: Female

## 2021-07-03 ENCOUNTER — Encounter: Payer: Self-pay | Admitting: Orthopedic Surgery

## 2021-07-03 NOTE — Progress Notes (Signed)
Office Visit Note   Patient: Jesse [name]           Date of Birth: October 20, 1961           MRN: [id number] Visit Date: 05/31/2021              Requested by: No referring provider defined for this encounter. PCP: Pcp, No  Chief Complaint  Patient presents with   Right Foot - Routine Post Op    04/20/21 excisional debridement right foot with kerecis graft       HPI: Patient presents 6 weeks status post excisional debridement skin graft right foot.  Assessment & Plan: Visit Diagnoses: No diagnosis found.  Plan: Patient continues to show progressive healing.  We will place him in a compression sock he will wash the wound daily and wear the compression daily patient may need additional skin graft.  Possibly umbilical cord graft  Follow-Up Instructions: Return in about 2 weeks (around 06/14/2021).   Ortho Exam  Patient is alert, oriented, no adenopathy, well-dressed, normal affect, normal respiratory effort. Examination patient's wound is 7 x 11 cm and is flat with 100% granulation tissue there is one central area with depth that is 1 cm in diameter.  Imaging: No results found. No images are attached to the encounter.  Labs: Lab Results  Component Value Date   HGBA1C 10.9 (H) 04/14/2021   HGBA1C 10.5 (H) 02/07/2021   HGBA1C 10.6 (H) 02/07/2021   ESRSEDRATE 108 (H) 04/08/2021   ESRSEDRATE 28 (H) 01/14/2016   CRP 27.2 (H) 04/08/2021   LABURIC 7.0 01/14/2016   REPTSTATUS 04/23/2021 FINAL 04/18/2021   GRAMSTAIN NO WBC SEEN NO ORGANISMS SEEN  04/18/2021   CULT  04/18/2021    RARE PROTEUS MIRABILIS WITHIN MIXED ORGANISMS NO ANAEROBES ISOLATED Performed at [facility], [address] [place], [place] [address]    LABORGA PROTEUS MIRABILIS 04/18/2021     Lab Results  Component Value Date   ALBUMIN 2.9 (L) 04/18/2021   ALBUMIN 2.9 (L) 04/14/2021   ALBUMIN 3.0 (L) 04/08/2021    Lab Results  Component Value Date   MG 1.8 04/18/2021   MG 1.9 04/14/2021    MG 1.6 (L) 02/09/2021   No results found for: VD25OH  No results found for: PREALBUMIN CBC EXTENDED Latest Ref Rng & Units 04/25/2021 04/24/2021 04/23/2021  WBC 4.0 - 10.5 K/uL 13.5(H) 11.1(H) -  RBC 4.22 - 5.81 MIL/uL 3.30(L) 3.43(L) -  HGB 13.0 - 17.0 g/dL 10.5(L) 10.7(L) 10.0(L)  HCT 39.0 - 52.0 % 32.0(L) 33.3(L) 30.4(L)  PLT 150 - 400 K/uL 274 294 -  NEUTROABS 1.7 - 7.7 K/uL 9.6(H) 7.6 -  LYMPHSABS 0.7 - 4.0 K/uL 2.0 2.1 -     There is no height or weight on file to calculate BMI.  Orders:  No orders of the defined types were placed in this encounter.  No orders of the defined types were placed in this encounter.    Procedures: No procedures performed  Clinical Data: No additional findings.  ROS:  All other systems negative, except as noted in the HPI. Review of Systems  Objective: Vital Signs: There were no vitals taken for this visit.  Specialty Comments:  No specialty comments available.  PMFS History: Patient Active Problem List   Diagnosis Date Noted   Cellulitis of right foot 04/17/2021   Osteomyelitis (HCC) 04/14/2021   Right foot infection    Diabetic foot infection (HCC) 04/08/2021   HTN (hypertension) 04/08/2021  HLD (hyperlipidemia) 04/08/2021   Alcohol abuse    Cellulitis    Bilateral leg edema 02/07/2021   Anasarca    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 2 diabetes mellitus, limited to breakdown of skin (HCC)    Hypokalemia 01/28/2014   DM (diabetes mellitus), type 2, uncontrolled (HCC) 01/27/2014   AKI (acute kidney injury) (HCC) 01/27/2014   Leukocytosis 01/27/2014   Facial cellulitis 01/26/2014   Dental abscess 01/26/2014   Infected dental carries 01/26/2014   Cellulitis and abscess 01/26/2014   Past Medical History:  Diagnosis Date   Alcohol abuse    Diabetes mellitus type 2 in obese (HCC)    HTN (hypertension)     History reviewed. No pertinent family history.  Past Surgical History:  Procedure Laterality  Date   I & D EXTREMITY Right 04/18/2021   Procedure: IRRIGATION AND DEBRIDEMENT OF FOOT;  Surgeon: Nadara Mustard, MD;  Location: [facility] OR;  Service: Orthopedics;  Laterality: Right;   I & D EXTREMITY Right 04/20/2021   Procedure: EXCISIONAL DEBRIDEMENT RIGHT FOOT, APPLICATION OF SKIN GRAFT;  Surgeon: Nadara Mustard, MD;  Location: [facility] OR;  Service: Orthopedics;  Laterality: Right;   Social History   Occupational History   Occupation: Curator  Tobacco Use   Smoking status: Never   Smokeless tobacco: Never  Vaping Use   Vaping Use: Never used  Substance and Sexual Activity   Alcohol use: Yes    Alcohol/week: 2.0 standard drinks    Types: 2 Cans of beer per week    Comment: Occasional   Drug use: No   Sexual activity: Not Currently    Partners: Female

## 2021-07-09 ENCOUNTER — Ambulatory Visit: Payer: Self-pay | Admitting: Orthopedic Surgery

## 2021-07-12 ENCOUNTER — Ambulatory Visit (INDEPENDENT_AMBULATORY_CARE_PROVIDER_SITE_OTHER): Payer: Self-pay | Admitting: Orthopedic Surgery

## 2021-07-12 ENCOUNTER — Encounter: Payer: Self-pay | Admitting: Orthopedic Surgery

## 2021-07-12 DIAGNOSIS — L97511 Non-pressure chronic ulcer of other part of right foot limited to breakdown of skin: Secondary | ICD-10-CM

## 2021-07-12 DIAGNOSIS — E1165 Type 2 diabetes mellitus with hyperglycemia: Secondary | ICD-10-CM

## 2021-07-17 ENCOUNTER — Encounter: Payer: Self-pay | Admitting: Orthopedic Surgery

## 2021-07-17 NOTE — Progress Notes (Signed)
Office Visit Note   Patient: Jesse [name]           Date of Birth: 1961/11/05           MRN: [id number] Visit Date: 07/12/2021              Requested by: No referring provider defined for this encounter. PCP: Pcp, No  Chief Complaint  Patient presents with   Right Foot - Pain      HPI: Patient is a 60 year old gentleman who was seen in follow-up for placental cord skin graft right foot.  Patient is currently in a wheelchair with a compression stocking.  Assessment & Plan: Visit Diagnoses:  1. Non-pressure chronic ulcer of other part of right foot limited to breakdown of skin (HCC)   2. Uncontrolled type 2 diabetes mellitus with hyperglycemia (HCC)     Plan: Continue with current wound care with soap and water and wear the compression stocking nonweightbearing.  Follow-Up Instructions: Return in about 3 weeks (around 08/02/2021).   Ortho Exam  Patient is alert, oriented, no adenopathy, well-dressed, normal affect, normal respiratory effort. Examination the wound bed has 100% healthy granulation tissue this is flat there is no epiboly around the wound edges.  The triangular surface area is 39 x 70 mm.  Imaging: No results found. No images are attached to the encounter.  Labs: Lab Results  Component Value Date   HGBA1C 10.9 (H) 04/14/2021   HGBA1C 10.5 (H) 02/07/2021   HGBA1C 10.6 (H) 02/07/2021   ESRSEDRATE 108 (H) 04/08/2021   ESRSEDRATE 28 (H) 01/14/2016   CRP 27.2 (H) 04/08/2021   LABURIC 7.0 01/14/2016   REPTSTATUS 04/23/2021 FINAL 04/18/2021   GRAMSTAIN NO WBC SEEN NO ORGANISMS SEEN  04/18/2021   CULT  04/18/2021    RARE PROTEUS MIRABILIS WITHIN MIXED ORGANISMS NO ANAEROBES ISOLATED Performed at [facility], [address] [place], [place] [address]    LABORGA PROTEUS MIRABILIS 04/18/2021     Lab Results  Component Value Date   ALBUMIN 2.9 (L) 04/18/2021   ALBUMIN 2.9 (L) 04/14/2021   ALBUMIN 3.0 (L) 04/08/2021    Lab Results   Component Value Date   MG 1.8 04/18/2021   MG 1.9 04/14/2021   MG 1.6 (L) 02/09/2021   No results found for: VD25OH  No results found for: PREALBUMIN CBC EXTENDED Latest Ref Rng & Units 04/25/2021 04/24/2021 04/23/2021  WBC 4.0 - 10.5 K/uL 13.5(H) 11.1(H) -  RBC 4.22 - 5.81 MIL/uL 3.30(L) 3.43(L) -  HGB 13.0 - 17.0 g/dL 10.5(L) 10.7(L) 10.0(L)  HCT 39.0 - 52.0 % 32.0(L) 33.3(L) 30.4(L)  PLT 150 - 400 K/uL 274 294 -  NEUTROABS 1.7 - 7.7 K/uL 9.6(H) 7.6 -  LYMPHSABS 0.7 - 4.0 K/uL 2.0 2.1 -     There is no height or weight on file to calculate BMI.  Orders:  No orders of the defined types were placed in this encounter.  No orders of the defined types were placed in this encounter.    Procedures: No procedures performed  Clinical Data: No additional findings.  ROS:  All other systems negative, except as noted in the HPI. Review of Systems  Objective: Vital Signs: There were no vitals taken for this visit.  Specialty Comments:  No specialty comments available.  PMFS History: Patient Active Problem List   Diagnosis Date Noted   Cellulitis of right foot 04/17/2021   Osteomyelitis (HCC) 04/14/2021   Right foot infection    Diabetic  foot infection (HCC) 04/08/2021   HTN (hypertension) 04/08/2021   HLD (hyperlipidemia) 04/08/2021   Alcohol abuse    Cellulitis    Bilateral leg edema 02/07/2021   Anasarca    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 2 diabetes mellitus, limited to breakdown of skin (HCC)    Hypokalemia 01/28/2014   DM (diabetes mellitus), type 2, uncontrolled (HCC) 01/27/2014   AKI (acute kidney injury) (HCC) 01/27/2014   Leukocytosis 01/27/2014   Facial cellulitis 01/26/2014   Dental abscess 01/26/2014   Infected dental carries 01/26/2014   Cellulitis and abscess 01/26/2014   Past Medical History:  Diagnosis Date   Alcohol abuse    Diabetes mellitus type 2 in obese (HCC)    HTN (hypertension)     History reviewed. No  pertinent family history.  Past Surgical History:  Procedure Laterality Date   I & D EXTREMITY Right 04/18/2021   Procedure: IRRIGATION AND DEBRIDEMENT OF FOOT;  Surgeon: Nadara Mustard, MD;  Location: [facility] OR;  Service: Orthopedics;  Laterality: Right;   I & D EXTREMITY Right 04/20/2021   Procedure: EXCISIONAL DEBRIDEMENT RIGHT FOOT, APPLICATION OF SKIN GRAFT;  Surgeon: Nadara Mustard, MD;  Location: [facility] OR;  Service: Orthopedics;  Laterality: Right;   Social History   Occupational History   Occupation: Curator  Tobacco Use   Smoking status: Never   Smokeless tobacco: Never  Vaping Use   Vaping Use: Never used  Substance and Sexual Activity   Alcohol use: Yes    Alcohol/week: 2.0 standard drinks    Types: 2 Cans of beer per week    Comment: Occasional   Drug use: No   Sexual activity: Not Currently    Partners: Female

## 2021-08-02 ENCOUNTER — Ambulatory Visit (INDEPENDENT_AMBULATORY_CARE_PROVIDER_SITE_OTHER): Payer: Self-pay | Admitting: Orthopedic Surgery

## 2021-08-02 ENCOUNTER — Encounter: Payer: Self-pay | Admitting: Orthopedic Surgery

## 2021-08-02 DIAGNOSIS — L97511 Non-pressure chronic ulcer of other part of right foot limited to breakdown of skin: Secondary | ICD-10-CM

## 2021-08-02 DIAGNOSIS — E1165 Type 2 diabetes mellitus with hyperglycemia: Secondary | ICD-10-CM

## 2021-08-02 NOTE — Progress Notes (Signed)
Office Visit Note   Patient: Jesse [name]           Date of Birth: 10/21/1961           MRN: [id number] Visit Date: 08/02/2021              Requested by: No referring provider defined for this encounter. PCP: Pcp, No  Chief Complaint  Patient presents with   Right Foot - Routine Post Op    04/20/21 excisional debridement Kerecis graft 06/07/21 in office epicord application       HPI: Patient is a 60 year old gentleman who presents in follow-up for his right foot.  He is status post Kerecis skin graft in June and status post in office epi cord placement in July.  Patient is currently wearing a Vive compression sock with gauze and an Ace wrap on the outside he states he has minimal drainage.  Patient is in a postoperative shoe.  Assessment & Plan: Visit Diagnoses:  1. Non-pressure chronic ulcer of other part of right foot limited to breakdown of skin (HCC)   2. Uncontrolled type 2 diabetes mellitus with hyperglycemia (HCC)     Plan: Continue with current wound care continue with compression continue with protected weightbearing.  Follow-Up Instructions: Return in about 4 weeks (around 08/30/2021).   Ortho Exam  Patient is alert, oriented, no adenopathy, well-dressed, normal affect, normal respiratory effort. Examination the ulcer is smaller there is healthy granulation tissue in the wound bed there is no cellulitis no odor no drainage no tunneling of the wound.  Imaging: No results found.    Labs: Lab Results  Component Value Date   HGBA1C 10.9 (H) 04/14/2021   HGBA1C 10.5 (H) 02/07/2021   HGBA1C 10.6 (H) 02/07/2021   ESRSEDRATE 108 (H) 04/08/2021   ESRSEDRATE 28 (H) 01/14/2016   CRP 27.2 (H) 04/08/2021   LABURIC 7.0 01/14/2016   REPTSTATUS 04/23/2021 FINAL 04/18/2021   GRAMSTAIN NO WBC SEEN NO ORGANISMS SEEN  04/18/2021   CULT  04/18/2021    RARE PROTEUS MIRABILIS WITHIN MIXED ORGANISMS NO ANAEROBES ISOLATED Performed at [facility], [address] [place], [place] [address]    LABORGA PROTEUS MIRABILIS 04/18/2021     Lab Results  Component Value Date   ALBUMIN 2.9 (L) 04/18/2021   ALBUMIN 2.9 (L) 04/14/2021   ALBUMIN 3.0 (L) 04/08/2021    Lab Results  Component Value Date   MG 1.8 04/18/2021   MG 1.9 04/14/2021   MG 1.6 (L) 02/09/2021   No results found for: VD25OH  No results found for: PREALBUMIN CBC EXTENDED Latest Ref Rng & Units 04/25/2021 04/24/2021 04/23/2021  WBC 4.0 - 10.5 K/uL 13.5(H) 11.1(H) -  RBC 4.22 - 5.81 MIL/uL 3.30(L) 3.43(L) -  HGB 13.0 - 17.0 g/dL 10.5(L) 10.7(L) 10.0(L)  HCT 39.0 - 52.0 % 32.0(L) 33.3(L) 30.4(L)  PLT 150 - 400 K/uL 274 294 -  NEUTROABS 1.7 - 7.7 K/uL 9.6(H) 7.6 -  LYMPHSABS 0.7 - 4.0 K/uL 2.0 2.1 -     There is no height or weight on file to calculate BMI.  Orders:  No orders of the defined types were placed in this encounter.  No orders of the defined types were placed in this encounter.    Procedures: No procedures performed  Clinical Data: No additional findings.  ROS:  All other systems negative, except as noted in the HPI. Review of Systems  Objective: Vital Signs: There were no vitals taken for this visit.  Specialty Comments:  No specialty comments available.  PMFS History: Patient Active Problem List   Diagnosis Date Noted   Cellulitis of right foot 04/17/2021   Osteomyelitis (HCC) 04/14/2021   Right foot infection    Diabetic foot infection (HCC) 04/08/2021   HTN (hypertension) 04/08/2021   HLD (hyperlipidemia) 04/08/2021   Alcohol abuse    Cellulitis    Bilateral leg edema 02/07/2021   Anasarca    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 2 diabetes mellitus, limited to breakdown of skin (HCC)    Hypokalemia 01/28/2014   DM (diabetes mellitus), type 2, uncontrolled (HCC) 01/27/2014   AKI (acute kidney injury) (HCC) 01/27/2014   Leukocytosis 01/27/2014   Facial cellulitis 01/26/2014   Dental abscess 01/26/2014   Infected  dental carries 01/26/2014   Cellulitis and abscess 01/26/2014   Past Medical History:  Diagnosis Date   Alcohol abuse    Diabetes mellitus type 2 in obese (HCC)    HTN (hypertension)     History reviewed. No pertinent family history.  Past Surgical History:  Procedure Laterality Date   I & D EXTREMITY Right 04/18/2021   Procedure: IRRIGATION AND DEBRIDEMENT OF FOOT;  Surgeon: Nadara Mustard, MD;  Location: [facility] OR;  Service: Orthopedics;  Laterality: Right;   I & D EXTREMITY Right 04/20/2021   Procedure: EXCISIONAL DEBRIDEMENT RIGHT FOOT, APPLICATION OF SKIN GRAFT;  Surgeon: Nadara Mustard, MD;  Location: [facility] OR;  Service: Orthopedics;  Laterality: Right;   Social History   Occupational History   Occupation: Curator  Tobacco Use   Smoking status: Never   Smokeless tobacco: Never  Vaping Use   Vaping Use: Never used  Substance and Sexual Activity   Alcohol use: Yes    Alcohol/week: 2.0 standard drinks    Types: 2 Cans of beer per week    Comment: Occasional   Drug use: No   Sexual activity: Not Currently    Partners: Female

## 2021-08-15 ENCOUNTER — Other Ambulatory Visit: Payer: Self-pay

## 2021-08-15 ENCOUNTER — Ambulatory Visit (INDEPENDENT_AMBULATORY_CARE_PROVIDER_SITE_OTHER): Payer: Self-pay | Admitting: Family

## 2021-08-15 ENCOUNTER — Encounter: Payer: Self-pay | Admitting: Family

## 2021-08-15 DIAGNOSIS — L97511 Non-pressure chronic ulcer of other part of right foot limited to breakdown of skin: Secondary | ICD-10-CM

## 2021-08-15 DIAGNOSIS — E11621 Type 2 diabetes mellitus with foot ulcer: Secondary | ICD-10-CM

## 2021-08-15 DIAGNOSIS — L97411 Non-pressure chronic ulcer of right heel and midfoot limited to breakdown of skin: Secondary | ICD-10-CM

## 2021-08-15 MED ORDER — SULFAMETHOXAZOLE-TRIMETHOPRIM 800-160 MG PO TABS: 1.0000 | ORAL_TABLET | Freq: Two times a day (BID) | ORAL | 0 refills | Status: DC

## 2021-08-15 NOTE — Progress Notes (Signed)
Office Visit Note   Patient: Jesse [name]           Date of Birth: Jul 01, 1961           MRN: [id number] Visit Date: 08/15/2021              Requested by: No referring provider defined for this encounter. PCP: Pcp, No  No chief complaint on file.     HPI: The patient is a 60 year old gentleman who presents today for concern of purulent drainage from the wound to his right foot this is chronic.  He is status post Kerecis skin graft in June as well as an in office epi cord placement in July of this year.  He has been using a medical compression sock with direct skin contact however today he is wearing a dry dressing with an Ace wrap.  He is using a postop shoe  Denies any fevers or chills  Assessment & Plan: Visit Diagnoses:  1. 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 2 diabetes mellitus, limited to breakdown of skin (HCC)   2. Midfoot skin ulcer, right, limited to breakdown of skin (HCC)     Plan: Continue with current wound care.  Given 4 x 4's and Ace wrap.  Recommended he resume the medical compression stocking.  He will follow-up in 2 more weeks.  Did send bactrim as well.  Follow-Up Instructions: No follow-ups on file.   Ortho Exam  Patient is alert, oriented, no adenopathy, well-dressed, normal affect, normal respiratory effort.  On the right: does have pitting edema up to the tibial tubercle.  There is dimpling of the skin and mild warmth. On examination of right foot the ulcer has circumferential epithelialization.  The wound bed is filled with healthy granulation tissue.  There is no surrounding erythema no epiboly no drainage no odor  Remedy would pitting edema up to the tibial tubercle this is somewhat less than on the right.  No erythema, warmth  Imaging: No results found. No images are attached to the encounter.  Labs: Lab Results  Component Value Date   HGBA1C 10.9 (H) 04/14/2021   HGBA1C 10.5 (H) 02/07/2021   HGBA1C 10.6 (H) 02/07/2021    ESRSEDRATE 108 (H) 04/08/2021   ESRSEDRATE 28 (H) 01/14/2016   CRP 27.2 (H) 04/08/2021   LABURIC 7.0 01/14/2016   REPTSTATUS 04/23/2021 FINAL 04/18/2021   GRAMSTAIN NO WBC SEEN NO ORGANISMS SEEN  04/18/2021   CULT  04/18/2021    RARE PROTEUS MIRABILIS WITHIN MIXED ORGANISMS NO ANAEROBES ISOLATED Performed at [facility], [address], [place], [place] [address]    LABORGA PROTEUS MIRABILIS 04/18/2021     Lab Results  Component Value Date   ALBUMIN 2.9 (L) 04/18/2021   ALBUMIN 2.9 (L) 04/14/2021   ALBUMIN 3.0 (L) 04/08/2021    Lab Results  Component Value Date   MG 1.8 04/18/2021   MG 1.9 04/14/2021   MG 1.6 (L) 02/09/2021   No results found for: VD25OH  No results found for: PREALBUMIN CBC EXTENDED Latest Ref Rng & Units 04/25/2021 04/24/2021 04/23/2021  WBC 4.0 - 10.5 K/uL 13.5(H) 11.1(H) -  RBC 4.22 - 5.81 MIL/uL 3.30(L) 3.43(L) -  HGB 13.0 - 17.0 g/dL 10.5(L) 10.7(L) 10.0(L)  HCT 39.0 - 52.0 % 32.0(L) 33.3(L) 30.4(L)  PLT 150 - 400 K/uL 274 294 -  NEUTROABS 1.7 - 7.7 K/uL 9.6(H) 7.6 -  LYMPHSABS 0.7 - 4.0 K/uL 2.0 2.1 -  There is no height or weight on file to calculate BMI.  Orders:  No orders of the defined types were placed in this encounter.  No orders of the defined types were placed in this encounter.    Procedures: No procedures performed  Clinical Data: No additional findings.  ROS:  All other systems negative, except as noted in the HPI. Review of Systems  Objective: Vital Signs: There were no vitals taken for this visit.  Specialty Comments:  No specialty comments available.  PMFS History: Patient Active Problem List   Diagnosis Date Noted   Cellulitis of right foot 04/17/2021   Osteomyelitis (HCC) 04/14/2021   Right foot infection    Diabetic foot infection (HCC) 04/08/2021   HTN (hypertension) 04/08/2021   HLD (hyperlipidemia) 04/08/2021   Alcohol abuse    Cellulitis    Bilateral leg edema 02/07/2021    Anasarca    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 2 diabetes mellitus, limited to breakdown of skin (HCC)    Hypokalemia 01/28/2014   DM (diabetes mellitus), type 2, uncontrolled 01/27/2014   AKI (acute kidney injury) (HCC) 01/27/2014   Leukocytosis 01/27/2014   Facial cellulitis 01/26/2014   Dental abscess 01/26/2014   Infected dental carries 01/26/2014   Cellulitis and abscess 01/26/2014   Past Medical History:  Diagnosis Date   Alcohol abuse    Diabetes mellitus type 2 in obese (HCC)    HTN (hypertension)     History reviewed. No pertinent family history.  Past Surgical History:  Procedure Laterality Date   I & D EXTREMITY Right 04/18/2021   Procedure: IRRIGATION AND DEBRIDEMENT OF FOOT;  Surgeon: Nadara Mustard, MD;  Location: [facility] OR;  Service: Orthopedics;  Laterality: Right;   I & D EXTREMITY Right 04/20/2021   Procedure: EXCISIONAL DEBRIDEMENT RIGHT FOOT, APPLICATION OF SKIN GRAFT;  Surgeon: Nadara Mustard, MD;  Location: [facility] OR;  Service: Orthopedics;  Laterality: Right;   Social History   Occupational History   Occupation: Curator  Tobacco Use   Smoking status: Never   Smokeless tobacco: Never  Vaping Use   Vaping Use: Never used  Substance and Sexual Activity   Alcohol use: Yes    Alcohol/week: 2.0 standard drinks    Types: 2 Cans of beer per week    Comment: Occasional   Drug use: No   Sexual activity: Not Currently    Partners: Female

## 2021-08-23 ENCOUNTER — Telehealth: Payer: Self-pay | Admitting: Orthopedic Surgery

## 2021-08-23 NOTE — Telephone Encounter (Signed)
Pt was in the office today and given supplies.

## 2021-08-23 NOTE — Telephone Encounter (Signed)
Pt states that he needs the thing to wrap around his foot for his sore. Also the pads that goes underneath.   CB [phone]

## 2021-08-30 ENCOUNTER — Ambulatory Visit: Payer: Self-pay | Admitting: Orthopedic Surgery

## 2021-09-03 ENCOUNTER — Ambulatory Visit: Payer: Self-pay | Admitting: Orthopedic Surgery

## 2021-09-04 ENCOUNTER — Ambulatory Visit (INDEPENDENT_AMBULATORY_CARE_PROVIDER_SITE_OTHER): Payer: Self-pay | Admitting: Family

## 2021-09-04 ENCOUNTER — Encounter: Payer: Self-pay | Admitting: Family

## 2021-09-04 DIAGNOSIS — E11628 Type 2 diabetes mellitus with other skin complications: Secondary | ICD-10-CM

## 2021-09-04 DIAGNOSIS — L97511 Non-pressure chronic ulcer of other part of right foot limited to breakdown of skin: Secondary | ICD-10-CM

## 2021-09-04 DIAGNOSIS — E11621 Type 2 diabetes mellitus with foot ulcer: Secondary | ICD-10-CM

## 2021-09-04 DIAGNOSIS — L089 Local infection of the skin and subcutaneous tissue, unspecified: Secondary | ICD-10-CM

## 2021-09-04 NOTE — Progress Notes (Signed)
Office Visit Note   Patient: Jesse [name]           Date of Birth: 04-15-1961           MRN: [id number] Visit Date: 09/04/2021              Requested by: No referring provider defined for this encounter. PCP: Pcp, No  Chief Complaint  Patient presents with   Right Foot - Wound Check    Diabetic ulcer of toe       HPI: The patient is a 60 year old gentleman who presents today in routine follow-up.  He is status post excisional debridement of the right foot with application of skin graft on June 10 of this year. He is status post Kerecis skin graft in June as well as an in office epi cord placement in July of this year.  He has been using the medical compression stocking with direct skin contact.    Did not pick up the Bactrim from the pharmacy or complete this course.  Assessment & Plan: Visit Diagnoses:  1. 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 2 diabetes mellitus, limited to breakdown of skin (HCC)   2. Diabetic foot infection (HCC)     Plan: Continue with current wound care.  Given 2 ace wraps.  He will continue the medical compression stocking.  He will follow-up in 3 more weeks.   Follow-Up Instructions: Return in about 3 weeks (around 09/25/2021).   Ortho Exam  Patient is alert, oriented, no adenopathy, well-dressed, normal affect, normal respiratory effort.  On examination bilateral lower extremity pitting edema dimpling of skin up to the tibial tubercles bilaterally no erythema no warmth no drainage  On examination of right foot, plantar ulcer, this ulcer has circumferential epithelialization.  The wound bed is filled with healthy granulation tissue.  There is no surrounding erythema no epiboly no drainage no odor Significant chronic swelling of great toe. Ulcer to tip is superficial and dime sized. No erythema or drainage.  Imaging: No results found. No images are attached to the encounter.  Labs: Lab Results  Component Value Date    HGBA1C 10.9 (H) 04/14/2021   HGBA1C 10.5 (H) 02/07/2021   HGBA1C 10.6 (H) 02/07/2021   ESRSEDRATE 108 (H) 04/08/2021   ESRSEDRATE 28 (H) 01/14/2016   CRP 27.2 (H) 04/08/2021   LABURIC 7.0 01/14/2016   REPTSTATUS 04/23/2021 FINAL 04/18/2021   GRAMSTAIN NO WBC SEEN NO ORGANISMS SEEN  04/18/2021   CULT  04/18/2021    RARE PROTEUS MIRABILIS WITHIN MIXED ORGANISMS NO ANAEROBES ISOLATED Performed at [facility], [address] [place], [place] [address]    LABORGA PROTEUS MIRABILIS 04/18/2021     Lab Results  Component Value Date   ALBUMIN 2.9 (L) 04/18/2021   ALBUMIN 2.9 (L) 04/14/2021   ALBUMIN 3.0 (L) 04/08/2021    Lab Results  Component Value Date   MG 1.8 04/18/2021   MG 1.9 04/14/2021   MG 1.6 (L) 02/09/2021   No results found for: VD25OH  No results found for: PREALBUMIN CBC EXTENDED Latest Ref Rng & Units 04/25/2021 04/24/2021 04/23/2021  WBC 4.0 - 10.5 K/uL 13.5(H) 11.1(H) -  RBC 4.22 - 5.81 MIL/uL 3.30(L) 3.43(L) -  HGB 13.0 - 17.0 g/dL 10.5(L) 10.7(L) 10.0(L)  HCT 39.0 - 52.0 % 32.0(L) 33.3(L) 30.4(L)  PLT 150 - 400 K/uL 274 294 -  NEUTROABS 1.7 - 7.7 K/uL 9.6(H) 7.6 -  LYMPHSABS 0.7 - 4.0 K/uL 2.0  2.1 -     There is no height or weight on file to calculate BMI.  Orders:  No orders of the defined types were placed in this encounter.  No orders of the defined types were placed in this encounter.    Procedures: No procedures performed  Clinical Data: No additional findings.  ROS:  All other systems negative, except as noted in the HPI. Review of Systems  Objective: Vital Signs: There were no vitals taken for this visit.  Specialty Comments:  No specialty comments available.  PMFS History: Patient Active Problem List   Diagnosis Date Noted   Cellulitis of right foot 04/17/2021   Osteomyelitis (HCC) 04/14/2021   Right foot infection    Diabetic foot infection (HCC) 04/08/2021   HTN (hypertension) 04/08/2021   HLD (hyperlipidemia)  04/08/2021   Alcohol abuse    Cellulitis    Bilateral leg edema 02/07/2021   Anasarca    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 2 diabetes mellitus, limited to breakdown of skin (HCC)    Hypokalemia 01/28/2014   DM (diabetes mellitus), type 2, uncontrolled 01/27/2014   AKI (acute kidney injury) (HCC) 01/27/2014   Leukocytosis 01/27/2014   Facial cellulitis 01/26/2014   Dental abscess 01/26/2014   Infected dental carries 01/26/2014   Cellulitis and abscess 01/26/2014   Past Medical History:  Diagnosis Date   Alcohol abuse    Diabetes mellitus type 2 in obese (HCC)    HTN (hypertension)     History reviewed. No pertinent family history.  Past Surgical History:  Procedure Laterality Date   I & D EXTREMITY Right 04/18/2021   Procedure: IRRIGATION AND DEBRIDEMENT OF FOOT;  Surgeon: Nadara Mustard, MD;  Location: [facility] OR;  Service: Orthopedics;  Laterality: Right;   I & D EXTREMITY Right 04/20/2021   Procedure: EXCISIONAL DEBRIDEMENT RIGHT FOOT, APPLICATION OF SKIN GRAFT;  Surgeon: Nadara Mustard, MD;  Location: [facility] OR;  Service: Orthopedics;  Laterality: Right;   Social History   Occupational History   Occupation: Curator  Tobacco Use   Smoking status: Never   Smokeless tobacco: Never  Vaping Use   Vaping Use: Never used  Substance and Sexual Activity   Alcohol use: Yes    Alcohol/week: 2.0 standard drinks    Types: 2 Cans of beer per week    Comment: Occasional   Drug use: No   Sexual activity: Not Currently    Partners: Female

## 2021-09-25 ENCOUNTER — Other Ambulatory Visit: Payer: Self-pay

## 2021-09-25 ENCOUNTER — Ambulatory Visit (INDEPENDENT_AMBULATORY_CARE_PROVIDER_SITE_OTHER): Payer: Self-pay | Admitting: Family

## 2021-09-25 DIAGNOSIS — E11621 Type 2 diabetes mellitus with foot ulcer: Secondary | ICD-10-CM

## 2021-09-25 DIAGNOSIS — L97519 Non-pressure chronic ulcer of other part of right foot with unspecified severity: Secondary | ICD-10-CM

## 2021-09-25 DIAGNOSIS — L97521 Non-pressure chronic ulcer of other part of left foot limited to breakdown of skin: Secondary | ICD-10-CM

## 2021-09-25 DIAGNOSIS — L97511 Non-pressure chronic ulcer of other part of right foot limited to breakdown of skin: Secondary | ICD-10-CM

## 2021-09-25 DIAGNOSIS — E11628 Type 2 diabetes mellitus with other skin complications: Secondary | ICD-10-CM

## 2021-09-25 DIAGNOSIS — R600 Localized edema: Secondary | ICD-10-CM

## 2021-09-25 DIAGNOSIS — Z945 Skin transplant status: Secondary | ICD-10-CM

## 2021-09-26 ENCOUNTER — Encounter: Payer: Self-pay | Admitting: Family

## 2021-09-26 NOTE — Progress Notes (Signed)
Office Visit Note   Patient: Jesse [name]           Date of Birth: 04-07-1961           MRN: [id number] Visit Date: 09/25/2021              Requested by: No referring provider defined for this encounter. PCP: Pcp, No  Chief Complaint  Patient presents with   Right Foot - Wound Check    Diabetic ulcer of toe       HPI: The patient is a 60 year old gentleman who presents today in routine follow-up.  He is status post excisional debridement of the right foot with application of skin graft on June 10 of this year. He is status post Kerecis skin graft in June as well as an in office epi cord placement in July of this year.  He has been using the medical compression stocking with direct skin contact.    Presents today concerned for ulceration which is new over the dorsum of his left great toe.  He is unsure how he had obtained this wound thinks he may have stubbed the toe this is been ongoing for about a week with bloody drainage  Assessment & Plan: Visit Diagnoses:  No diagnosis found.   Plan: Concern for necrosis of right great toe given cool temperature and darkened appearance.  Continue with current wound care to the right foot with medical compression stocking with direct skin contact.  We will plan to follow-up in 10 days.  Have requested stat evaluation of the right lower extremity by vascular.  Antibacterial ointment dressing changes to the left great toe.  Follow-Up Instructions: Return in about 10 days (around 10/05/2021).   Ortho Exam  Patient is alert, oriented, no adenopathy, well-dressed, normal affect, normal respiratory effort.  On examination bilateral lower extremity pitting edema dimpling of skin up to the tibial tubercles bilaterally. no erythema no warmth ulcerations to his legs.  On examination of right foot, plantar ulcer, this ulcer has circumferential epithelialization.  This continues to improve.  Please see attached photo.  Was debrided of  surrounding nonviable tissue.  The wound bed is filled with healthy granulation tissue.  There is no surrounding erythema no epiboly no drainage no odor  Significant chronic swelling of great toe.  This is cool today.  Darkened skin color.  No open ulceration today no erythema no drainage.  Patient relates his toe is cold as it is a cold day and he rode in on his moped however the great toe is significantly cooler than the lesser toes and has not warmed up while he has been in the office.  On examination of the left foot over the dorsum of his left great toe he has a dime sized ulceration this is new this is 2 mm deep filled in with bleeding granulation tissue there is some surrounding epiboly and maceration there is no odor no purulence no erythema  Imaging: No results found. No images are attached to the encounter.  Labs: Lab Results  Component Value Date   HGBA1C 10.9 (H) 04/14/2021   HGBA1C 10.5 (H) 02/07/2021   HGBA1C 10.6 (H) 02/07/2021   ESRSEDRATE 108 (H) 04/08/2021   ESRSEDRATE 28 (H) 01/14/2016   CRP 27.2 (H) 04/08/2021   LABURIC 7.0 01/14/2016   REPTSTATUS 04/23/2021 FINAL 04/18/2021   GRAMSTAIN NO WBC SEEN NO ORGANISMS SEEN  04/18/2021   CULT  04/18/2021    RARE PROTEUS MIRABILIS WITHIN MIXED ORGANISMS NO  ANAEROBES ISOLATED Performed at [facility], [place] [address], [place], [place] [address]    LABORGA PROTEUS MIRABILIS 04/18/2021     Lab Results  Component Value Date   ALBUMIN 2.9 (L) 04/18/2021   ALBUMIN 2.9 (L) 04/14/2021   ALBUMIN 3.0 (L) 04/08/2021    Lab Results  Component Value Date   MG 1.8 04/18/2021   MG 1.9 04/14/2021   MG 1.6 (L) 02/09/2021   No results found for: VD25OH  No results found for: PREALBUMIN CBC EXTENDED Latest Ref Rng & Units 04/25/2021 04/24/2021 04/23/2021  WBC 4.0 - 10.5 K/uL 13.5(H) 11.1(H) -  RBC 4.22 - 5.81 MIL/uL 3.30(L) 3.43(L) -  HGB 13.0 - 17.0 g/dL 10.5(L) 10.7(L) 10.0(L)  HCT 39.0 - 52.0 % 32.0(L) 33.3(L)  30.4(L)  PLT 150 - 400 K/uL 274 294 -  NEUTROABS 1.7 - 7.7 K/uL 9.6(H) 7.6 -  LYMPHSABS 0.7 - 4.0 K/uL 2.0 2.1 -     There is no height or weight on file to calculate BMI.  Orders:  No orders of the defined types were placed in this encounter.  No orders of the defined types were placed in this encounter.    Procedures: No procedures performed  Clinical Data: No additional findings.  ROS:  All other systems negative, except as noted in the HPI. Review of Systems  Constitutional:  Negative for chills and fever.  Cardiovascular:  Positive for leg swelling.  Skin:  Positive for color change and wound.  Neurological:  Negative for numbness.   Objective: Vital Signs: There were no vitals taken for this visit.  Specialty Comments:  No specialty comments available.  PMFS History: Patient Active Problem List   Diagnosis Date Noted   Cellulitis of right foot 04/17/2021   Osteomyelitis ([place]) 04/14/2021   Right foot infection    Diabetic foot infection ([place]) 04/08/2021   HTN (hypertension) 04/08/2021   HLD (hyperlipidemia) 04/08/2021   Alcohol abuse    Cellulitis    Bilateral leg edema 02/07/2021   Anasarca    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 2 diabetes mellitus, limited to breakdown of skin ([place])    Hypokalemia 01/28/2014   DM (diabetes mellitus), type 2, uncontrolled 01/27/2014   AKI (acute kidney injury) ([place]) 01/27/2014   Leukocytosis 01/27/2014   Facial cellulitis 01/26/2014   Dental abscess 01/26/2014   Infected dental carries 01/26/2014   Cellulitis and abscess 01/26/2014   Past Medical History:  Diagnosis Date   Alcohol abuse    Diabetes mellitus type 2 in obese ([place])    HTN (hypertension)     History reviewed. No pertinent family history.  Past Surgical History:  Procedure Laterality Date   I & D EXTREMITY Right 04/18/2021   Procedure: IRRIGATION AND DEBRIDEMENT OF FOOT;  Surgeon: Newt Minion, MD;  Location: [facility];  Service:  Orthopedics;  Laterality: Right;   I & D EXTREMITY Right 04/20/2021   Procedure: EXCISIONAL DEBRIDEMENT RIGHT FOOT, APPLICATION OF SKIN GRAFT;  Surgeon: Newt Minion, MD;  Location: [facility];  Service: Orthopedics;  Laterality: Right;   Social History   Occupational History   Occupation: Dealer  Tobacco Use   Smoking status: Never   Smokeless tobacco: Never  Vaping Use   Vaping Use: Never used  Substance and Sexual Activity   Alcohol use: Yes    Alcohol/week: 2.0 standard drinks    Types: 2 Cans of beer per week    Comment: Occasional   Drug  use: No   Sexual activity: Not Currently    Partners: Female

## 2021-09-29 NOTE — Progress Notes (Deleted)
Office Note     CC:  Left great toe wound, right plantar wound Requesting Provider:  Suzan Slick, NP  HPI: Jesse [name] is a 60 y.o. ([birth year]) male presenting at the request of .Pcp, No ***  The pt is  on a statin for cholesterol management.  The pt is not on a daily aspirin.   Other AC:  - The pt is  on medication for hypertension.   The pt is  diabetic.  Tobacco hx:  Never Last A1C: 10.9  Past Medical History:  Diagnosis Date   Alcohol abuse    Diabetes mellitus type 2 in obese ([place])    HTN (hypertension)     Past Surgical History:  Procedure Laterality Date   I & D EXTREMITY Right 04/18/2021   Procedure: IRRIGATION AND DEBRIDEMENT OF FOOT;  Surgeon: Jesse Minion, MD;  Location: [facility];  Service: Orthopedics;  Laterality: Right;   I & D EXTREMITY Right 04/20/2021   Procedure: EXCISIONAL DEBRIDEMENT RIGHT FOOT, APPLICATION OF SKIN GRAFT;  Surgeon: Jesse Minion, MD;  Location: [facility];  Service: Orthopedics;  Laterality: Right;    Social History   Socioeconomic History   Marital status: Single    Spouse name: Not on file   Number of children: Not on file   Years of education: 10th grade   Highest education level: Not on file  Occupational History   Occupation: Dealer  Tobacco Use   Smoking status: Never   Smokeless tobacco: Never  Vaping Use   Vaping Use: Never used  Substance and Sexual Activity   Alcohol use: Yes    Alcohol/week: 2.0 standard drinks    Types: 2 Cans of beer per week    Comment: Occasional   Drug use: No   Sexual activity: Not Currently    Partners: Female  Other Topics Concern   Not on file  Social History Narrative   Not on file   Social Determinants of Health   Financial Resource Strain: Not on file  Food Insecurity: Not on file  Transportation Needs: Not on file  Physical Activity: Not on file  Stress: Not on file  Social Connections: Not on file  Intimate Partner Violence: Not on file   ***No family history on  file.  Current Outpatient Medications  Medication Sig Dispense Refill   Accu-Chek Softclix Lancets lancets Use as directed. 100 each 5   amoxicillin-clavulanate (AUGMENTIN) 875-125 MG tablet Take 1 tablet by mouth every 12 (twelve) hours. 20 tablet 0   atorvastatin (LIPITOR) 40 MG tablet Take 1 tablet (40 mg total) by mouth daily. 30 tablet 0   Blood Glucose Monitoring Suppl (ACCU-CHEK GUIDE) w/Device KIT Use as directed 1 kit 0   glucose blood (ACCU-CHEK GUIDE) test strip Use as instructed 100 each 12   insulin glargine (LANTUS) 100 UNIT/ML Solostar Pen INJECT 10 UNITS INTO THE SKIN DAILY. 6 mL 0   Insulin Pen Needle 31G X 8 MM MISC USE AS DIRECTED 100 each 0   losartan (COZAAR) 25 MG tablet TAKE 1 TABLET (25 MG TOTAL) BY MOUTH DAILY. 30 tablet 0   oxyCODONE-acetaminophen (PERCOCET/ROXICET) 5-325 MG tablet Take 1 tablet by mouth every 4 (four) hours as needed for severe pain. 30 tablet 0   sulfamethoxazole-trimethoprim (BACTRIM DS) 800-160 MG tablet Take 1 tablet by mouth 2 (two) times daily. 20 tablet 0   TRUEplus Lancets 28G MISC USE UP TO FOUR TIMES DAILY AS DIRECTED 100 each 0   No current  facility-administered medications for this visit.    No Known Allergies   REVIEW OF SYSTEMS:  *** '[X]'  denotes positive finding, '[ ]'  denotes negative finding Cardiac  Comments:  Chest pain or chest pressure:    Shortness of breath upon exertion:    Short of breath when lying flat:    Irregular heart rhythm:        Vascular    Pain in calf, thigh, or hip brought on by ambulation:    Pain in feet at night that wakes you up from your sleep:     Blood clot in your veins:    Leg swelling:         Pulmonary    Oxygen at home:    Productive cough:     Wheezing:         Neurologic    Sudden weakness in arms or legs:     Sudden numbness in arms or legs:     Sudden onset of difficulty speaking or slurred speech:    Temporary loss of vision in one eye:     Problems with dizziness:          Gastrointestinal    Blood in stool:     Vomited blood:         Genitourinary    Burning when urinating:     Blood in urine:        Psychiatric    Major depression:         Hematologic    Bleeding problems:    Problems with blood clotting too easily:        Skin    Rashes or ulcers:        Constitutional    Fever or chills:      PHYSICAL EXAMINATION:  There were no vitals filed for this visit.  General:  WDWN in NAD; vital signs documented above Gait: Not observed HENT: WNL, normocephalic Pulmonary: normal non-labored breathing , without wheezing Cardiac: {Desc; regular/irreg:14544} HR, bruit*** Abdomen: soft, NT, no masses Skin: {With/Without:20273} rashes Vascular Exam/Pulses:  Right Left  Radial {Exam; arterial pulse strength 0-4:30167} {Exam; arterial pulse strength 0-4:30167}  Ulnar {Exam; arterial pulse strength 0-4:30167} {Exam; arterial pulse strength 0-4:30167}  Femoral {Exam; arterial pulse strength 0-4:30167} {Exam; arterial pulse strength 0-4:30167}  Popliteal {Exam; arterial pulse strength 0-4:30167} {Exam; arterial pulse strength 0-4:30167}  DP {Exam; arterial pulse strength 0-4:30167} {Exam; arterial pulse strength 0-4:30167}  PT {Exam; arterial pulse strength 0-4:30167} {Exam; arterial pulse strength 0-4:30167}   Extremities: {With/Without:20273} ischemic changes, {With/Without:20273} Gangrene , {With/Without:20273} cellulitis; {With/Without:20273} open wounds;  Musculoskeletal: no muscle wasting or atrophy  Neurologic: A&O X 3;  No focal weakness or paresthesias are detected Psychiatric:  The pt has {Desc; normal/abnormal:11317::"Normal"} affect.   Non-Invasive Vascular Imaging:   ***    ASSESSMENT/PLAN: Jesse [name] is a 60 y.o. male presenting with ***   Recommend the following which can slow the progression of atherosclerosis and reduce the risk of major adverse cardiac / limb events:  Aspirin 88m PO QD.  Atorvastatin 40-80mg PO QD  (or other "high intensity" statin therapy). Complete cessation from all tobacco products. Blood glucose control with goal A1c < 7%. Blood pressure control with goal blood pressure < 140/90 mmHg. Lipid reduction therapy with goal LDL-C <100 mg/dL (<70 if symptomatic from PAD).     Jesse John MD [facility] [phone]

## 2021-10-01 ENCOUNTER — Encounter: Payer: Self-pay | Admitting: Vascular Surgery

## 2021-10-01 ENCOUNTER — Inpatient Hospital Stay (HOSPITAL_COMMUNITY): Admission: RE | Admit: 2021-10-01 | Payer: Self-pay | Source: Ambulatory Visit

## 2021-10-09 ENCOUNTER — Encounter: Payer: Self-pay | Admitting: Family

## 2021-10-09 ENCOUNTER — Ambulatory Visit (INDEPENDENT_AMBULATORY_CARE_PROVIDER_SITE_OTHER): Payer: Self-pay | Admitting: Family

## 2021-10-09 DIAGNOSIS — R609 Edema, unspecified: Secondary | ICD-10-CM

## 2021-10-09 DIAGNOSIS — L089 Local infection of the skin and subcutaneous tissue, unspecified: Secondary | ICD-10-CM

## 2021-10-09 DIAGNOSIS — L97521 Non-pressure chronic ulcer of other part of left foot limited to breakdown of skin: Secondary | ICD-10-CM

## 2021-10-09 NOTE — Progress Notes (Signed)
Office Visit Note   Patient: Jesse [name]           Date of Birth: 26-Feb-1961           MRN: [id number] Visit Date: 10/09/2021              Requested by: No referring provider defined for this encounter. PCP: Pcp, No  Chief Complaint  Patient presents with   Right Foot - Wound Check       HPI: The patient is a 60 year old gentleman who presents today in follow-up.  He is status post excisional debridement of the right foot with application of skin graft on 06/22. He is status post Kerecis skin graft in June as well as an in office epi cord placement in 7/22.  He has been using the medical compression stocking with direct skin contact.   Also presents in follow-up for ulcer over the dorsum of his left great toe.  There is some surrounding maceration he has been using a Band-Aid  Unfortunately he did miss his vascular appointment  Assessment & Plan: Visit Diagnoses:  No diagnosis found.   Plan: The right great toe today is near normothermic.  Would still like for him to be evaluated by vascular for bilateral lower extremities.  Concern for arterial insufficiency.  Continue with current wound care to the right foot with medical compression stocking with direct skin contact.  Would like for him to begin using the medical compression stocking to the left lower extremity as well. Follow-Up Instructions: No follow-ups on file.   Ortho Exam  Patient is alert, oriented, no adenopathy, well-dressed, normal affect, normal respiratory effort.    On examination bilateral lower extremity pitting edema dimpling of skin up to the tibial tubercles bilaterally.  There is minimal weeping today bilaterally.  Small ulcer to the right calf posteriorly this is 1 cm in diameter no erythema no warmth   On examination of right foot, plantar ulcer, this ulcer has circumferential epithelialization.  This is much improved remaining ulcer is just 2 mm x 10 mm.  There is no surrounding erythema no  epiboly no drainage no odor  Significant chronic swelling of great toe.  This is mildly cool today.  Darkened skin color.  No open ulceration today no erythema no drainage.  On examination of the left foot over the dorsum of his left great toe he has a dime sized ulceration this is new this is 2 mm deep filled in with granulation tissue. there is some surrounding epiboly and maceration. there is no odor no purulence no erythema  Imaging: No results found. No images are attached to the encounter.  Labs: Lab Results  Component Value Date   HGBA1C 10.9 (H) 04/14/2021   HGBA1C 10.5 (H) 02/07/2021   HGBA1C 10.6 (H) 02/07/2021   ESRSEDRATE 108 (H) 04/08/2021   ESRSEDRATE 28 (H) 01/14/2016   CRP 27.2 (H) 04/08/2021   LABURIC 7.0 01/14/2016   REPTSTATUS 04/23/2021 FINAL 04/18/2021   GRAMSTAIN NO WBC SEEN NO ORGANISMS SEEN  04/18/2021   CULT  04/18/2021    RARE PROTEUS MIRABILIS WITHIN MIXED ORGANISMS NO ANAEROBES ISOLATED Performed at [facility], [address] [place], [place] [address]    Imelda Pillow PROTEUS MIRABILIS 04/18/2021     Lab Results  Component Value Date   ALBUMIN 2.9 (L) 04/18/2021   ALBUMIN 2.9 (L) 04/14/2021   ALBUMIN 3.0 (L) 04/08/2021    Lab Results  Component Value Date   MG 1.8  04/18/2021   MG 1.9 04/14/2021   MG 1.6 (L) 02/09/2021   No results found for: VD25OH  No results found for: PREALBUMIN CBC EXTENDED Latest Ref Rng & Units 04/25/2021 04/24/2021 04/23/2021  WBC 4.0 - 10.5 K/uL 13.5(H) 11.1(H) -  RBC 4.22 - 5.81 MIL/uL 3.30(L) 3.43(L) -  HGB 13.0 - 17.0 g/dL 10.5(L) 10.7(L) 10.0(L)  HCT 39.0 - 52.0 % 32.0(L) 33.3(L) 30.4(L)  PLT 150 - 400 K/uL 274 294 -  NEUTROABS 1.7 - 7.7 K/uL 9.6(H) 7.6 -  LYMPHSABS 0.7 - 4.0 K/uL 2.0 2.1 -     There is no height or weight on file to calculate BMI.  Orders:  No orders of the defined types were placed in this encounter.  No orders of the defined types were placed in this encounter.     Procedures: No procedures performed  Clinical Data: No additional findings.  ROS:  All other systems negative, except as noted in the HPI. Review of Systems  Constitutional:  Negative for chills and fever.  Cardiovascular:  Positive for leg swelling.  Skin:  Positive for color change and wound.  Neurological:  Negative for numbness.   Objective: Vital Signs: There were no vitals taken for this visit.  Specialty Comments:  No specialty comments available.  PMFS History: Patient Active Problem List   Diagnosis Date Noted   Cellulitis of right foot 04/17/2021   Osteomyelitis ([place]) 04/14/2021   Right foot infection    Diabetic foot infection ([place]) 04/08/2021   HTN (hypertension) 04/08/2021   HLD (hyperlipidemia) 04/08/2021   Alcohol abuse    Cellulitis    Bilateral leg edema 02/07/2021   Anasarca    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 2 diabetes mellitus, limited to breakdown of skin ([place])    Hypokalemia 01/28/2014   DM (diabetes mellitus), type 2, uncontrolled 01/27/2014   AKI (acute kidney injury) ([place]) 01/27/2014   Leukocytosis 01/27/2014   Facial cellulitis 01/26/2014   Dental abscess 01/26/2014   Infected dental carries 01/26/2014   Cellulitis and abscess 01/26/2014   Past Medical History:  Diagnosis Date   Alcohol abuse    Diabetes mellitus type 2 in obese ([place])    HTN (hypertension)     History reviewed. No pertinent family history.  Past Surgical History:  Procedure Laterality Date   I & D EXTREMITY Right 04/18/2021   Procedure: IRRIGATION AND DEBRIDEMENT OF FOOT;  Surgeon: Newt Minion, MD;  Location: [facility];  Service: Orthopedics;  Laterality: Right;   I & D EXTREMITY Right 04/20/2021   Procedure: EXCISIONAL DEBRIDEMENT RIGHT FOOT, APPLICATION OF SKIN GRAFT;  Surgeon: Newt Minion, MD;  Location: [facility];  Service: Orthopedics;  Laterality: Right;   Social History   Occupational History   Occupation: Dealer  Tobacco Use    Smoking status: Never   Smokeless tobacco: Never  Vaping Use   Vaping Use: Never used  Substance and Sexual Activity   Alcohol use: Yes    Alcohol/week: 2.0 standard drinks    Types: 2 Cans of beer per week    Comment: Occasional   Drug use: No   Sexual activity: Not Currently    Partners: Female

## 2021-10-10 ENCOUNTER — Encounter (HOSPITAL_COMMUNITY): Payer: Self-pay

## 2021-10-10 ENCOUNTER — Encounter: Payer: Self-pay | Admitting: Vascular Surgery

## 2021-10-23 ENCOUNTER — Ambulatory Visit: Payer: Self-pay | Admitting: Family

## 2021-10-26 ENCOUNTER — Ambulatory Visit (INDEPENDENT_AMBULATORY_CARE_PROVIDER_SITE_OTHER): Payer: Self-pay | Admitting: Family

## 2021-10-26 DIAGNOSIS — R600 Localized edema: Secondary | ICD-10-CM

## 2021-10-26 DIAGNOSIS — Z945 Skin transplant status: Secondary | ICD-10-CM

## 2021-10-26 DIAGNOSIS — R609 Edema, unspecified: Secondary | ICD-10-CM

## 2021-10-26 DIAGNOSIS — L97521 Non-pressure chronic ulcer of other part of left foot limited to breakdown of skin: Secondary | ICD-10-CM

## 2021-10-31 ENCOUNTER — Encounter: Payer: Self-pay | Admitting: Family

## 2021-10-31 NOTE — Progress Notes (Signed)
Office Visit Note   Patient: Jesse [name]           Date of Birth: 11-Apr-1961           MRN: [id number] Visit Date: 10/26/2021              Requested by: No referring provider defined for this encounter. PCP: Pcp, No  Chief Complaint  Patient presents with   Right Foot - Follow-up       HPI: The patient is a 60 year old gentleman who presents today in follow-up for 2 separate issues.    1) He is status post excisional debridement of the right foot with application of skin graft on 06/22. He is status post Kerecis skin graft in June as well as an in office epi cord placement in 7/22.  He has been using the medical compression stocking with direct skin contact.   2) ulcer over the dorsum of his left great toe. he has been using a Band-Aid  Unfortunately he did miss his vascular appointment again and the referral has been closed.  Assessment & Plan: Visit Diagnoses:  No diagnosis found.   Plan:  have recommended daily dial soap cleansing. Vive socks to bilateral LEs with direct skin contact.  Follow-Up Instructions: Return in about 3 weeks (around 11/16/2021).   Ortho Exam  Patient is alert, oriented, no adenopathy, well-dressed, normal affect, normal respiratory effort.    On examination bilateral lower extremity pitting edema dimpling of skin up to the tibial tubercles bilaterally, pitting edema.  No weeping. No erythema no warmth   On examination of right foot, plantar ulcer with full granulation. Please see attached images for wound post debridement.  There is no surrounding erythema no epiboly no drainage no odor Significant chronic swelling of right great toe. Darkened skin color.  No open ulceration today no erythema no drainage.  On examination of the left foot over the dorsum of his left great toe he has a ulceration which is 8 mm in diamter and 1 mm deep with full granulation tissue. there is some surrounding epiboly and maceration. there is no odor no  purulence no erythema  Imaging: No results found. No images are attached to the encounter.  Labs: Lab Results  Component Value Date   HGBA1C 10.9 (H) 04/14/2021   HGBA1C 10.5 (H) 02/07/2021   HGBA1C 10.6 (H) 02/07/2021   ESRSEDRATE 108 (H) 04/08/2021   ESRSEDRATE 28 (H) 01/14/2016   CRP 27.2 (H) 04/08/2021   LABURIC 7.0 01/14/2016   REPTSTATUS 04/23/2021 FINAL 04/18/2021   GRAMSTAIN NO WBC SEEN NO ORGANISMS SEEN  04/18/2021   CULT  04/18/2021    RARE PROTEUS MIRABILIS WITHIN MIXED ORGANISMS NO ANAEROBES ISOLATED Performed at [facility], [address] [place], [place] [address]    LABORGA PROTEUS MIRABILIS 04/18/2021     Lab Results  Component Value Date   ALBUMIN 2.9 (L) 04/18/2021   ALBUMIN 2.9 (L) 04/14/2021   ALBUMIN 3.0 (L) 04/08/2021    Lab Results  Component Value Date   MG 1.8 04/18/2021   MG 1.9 04/14/2021   MG 1.6 (L) 02/09/2021   No results found for: VD25OH  No results found for: PREALBUMIN CBC EXTENDED Latest Ref Rng & Units 04/25/2021 04/24/2021 04/23/2021  WBC 4.0 - 10.5 K/uL 13.5(H) 11.1(H) -  RBC 4.22 - 5.81 MIL/uL 3.30(L) 3.43(L) -  HGB 13.0 - 17.0 g/dL 10.5(L) 10.7(L) 10.0(L)  HCT 39.0 - 52.0 % 32.0(L) 33.3(L) 30.4(L)  PLT 150 -  400 K/uL 274 294 -  NEUTROABS 1.7 - 7.7 K/uL 9.6(H) 7.6 -  LYMPHSABS 0.7 - 4.0 K/uL 2.0 2.1 -     There is no height or weight on file to calculate BMI.  Orders:  No orders of the defined types were placed in this encounter.  No orders of the defined types were placed in this encounter.    Procedures: No procedures performed  Clinical Data: No additional findings.  ROS:  All other systems negative, except as noted in the HPI. Review of Systems  Constitutional:  Negative for chills and fever.  Cardiovascular:  Positive for leg swelling.  Skin:  Positive for color change and wound.  Neurological:  Negative for numbness.   Objective: Vital Signs: There were no vitals taken for this  visit.  Specialty Comments:  No specialty comments available.  PMFS History: Patient Active Problem List   Diagnosis Date Noted   Cellulitis of right foot 04/17/2021   Osteomyelitis ([place]) 04/14/2021   Right foot infection    Diabetic foot infection ([place]) 04/08/2021   HTN (hypertension) 04/08/2021   HLD (hyperlipidemia) 04/08/2021   Alcohol abuse    Cellulitis    Bilateral leg edema 02/07/2021   Anasarca    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 2 diabetes mellitus, limited to breakdown of skin ([place])    Hypokalemia 01/28/2014   DM (diabetes mellitus), type 2, uncontrolled 01/27/2014   AKI (acute kidney injury) ([place]) 01/27/2014   Leukocytosis 01/27/2014   Facial cellulitis 01/26/2014   Dental abscess 01/26/2014   Infected dental carries 01/26/2014   Cellulitis and abscess 01/26/2014   Past Medical History:  Diagnosis Date   Alcohol abuse    Diabetes mellitus type 2 in obese ([place])    HTN (hypertension)     History reviewed. No pertinent family history.  Past Surgical History:  Procedure Laterality Date   I & D EXTREMITY Right 04/18/2021   Procedure: IRRIGATION AND DEBRIDEMENT OF FOOT;  Surgeon: Newt Minion, MD;  Location: [place];  Service: Orthopedics;  Laterality: Right;   I & D EXTREMITY Right 04/20/2021   Procedure: EXCISIONAL DEBRIDEMENT RIGHT FOOT, APPLICATION OF SKIN GRAFT;  Surgeon: Newt Minion, MD;  Location: [place];  Service: Orthopedics;  Laterality: Right;   Social History   Occupational History   Occupation: Dealer  Tobacco Use   Smoking status: Never   Smokeless tobacco: Never  Vaping Use   Vaping Use: Never used  Substance and Sexual Activity   Alcohol use: Yes    Alcohol/week: 2.0 standard drinks    Types: 2 Cans of beer per week    Comment: Occasional   Drug use: No   Sexual activity: Not Currently    Partners: Female

## 2021-11-16 ENCOUNTER — Ambulatory Visit (INDEPENDENT_AMBULATORY_CARE_PROVIDER_SITE_OTHER): Payer: Self-pay | Admitting: Family

## 2021-11-16 DIAGNOSIS — Z945 Skin transplant status: Secondary | ICD-10-CM

## 2021-11-16 DIAGNOSIS — R600 Localized edema: Secondary | ICD-10-CM

## 2021-11-20 ENCOUNTER — Encounter: Payer: Self-pay | Admitting: Family

## 2021-11-20 NOTE — Progress Notes (Signed)
Office Visit Note   Patient: Jesse [name]           Date of Birth: [birth year]           MRN: [id number] Visit Date: 11/16/2021              Requested by: No referring provider defined for this encounter. PCP: Pcp, No  Chief Complaint  Patient presents with   Right Foot - Follow-up    04/20/21 debridement right foot Kerecis graft application 06/07/21 in office Epicord application        HPI: The patient is a 61 year old gentleman who presents today in follow-up for 2 separate issues.    1) He is status post excisional debridement of the right foot with application of skin graft on 06/22. He is status post Kerecis skin graft in June as well as an in office epi cord placement in 7/22.  He has been using the medical compression stocking with direct skin contact.   Assessment & Plan: Visit Diagnoses:  No diagnosis found.   Plan:  have recommended daily dial soap cleansing. Vive socks to bilateral LEs with direct skin contact.  Follow-Up Instructions: No follow-ups on file.   Ortho Exam  Patient is alert, oriented, no adenopathy, well-dressed, normal affect, normal respiratory effort.    On examination bilateral lower extremity pitting edema dimpling of skin up to the tibial tubercles bilaterally, pitting edema.  No weeping. No erythema no warmth   On examination of right foot, plantar ulcer with full granulation. This is stable. Please see attached images. There is no surrounding erythema no epiboly no drainage no odor. Significant chronic swelling of right great toe. Darkened skin color.  No open ulceration today no erythema no drainage.   Imaging: No results found. No images are attached to the encounter.  Labs: Lab Results  Component Value Date   HGBA1C 10.9 (H) 04/14/2021   HGBA1C 10.5 (H) 02/07/2021   HGBA1C 10.6 (H) 02/07/2021   ESRSEDRATE 108 (H) 04/08/2021   ESRSEDRATE 28 (H) 01/14/2016   CRP 27.2 (H) 04/08/2021   LABURIC 7.0 01/14/2016   REPTSTATUS  04/23/2021 FINAL 04/18/2021   GRAMSTAIN NO WBC SEEN NO ORGANISMS SEEN  04/18/2021   CULT  04/18/2021    RARE PROTEUS MIRABILIS WITHIN MIXED ORGANISMS NO ANAEROBES ISOLATED Performed at [facility], [address], [place], [place] [address]    LABORGA PROTEUS MIRABILIS 04/18/2021     Lab Results  Component Value Date   ALBUMIN 2.9 (L) 04/18/2021   ALBUMIN 2.9 (L) 04/14/2021   ALBUMIN 3.0 (L) 04/08/2021    Lab Results  Component Value Date   MG 1.8 04/18/2021   MG 1.9 04/14/2021   MG 1.6 (L) 02/09/2021   No results found for: VD25OH  No results found for: PREALBUMIN CBC EXTENDED Latest Ref Rng & Units 04/25/2021 04/24/2021 04/23/2021  WBC 4.0 - 10.5 K/uL 13.5(H) 11.1(H) -  RBC 4.22 - 5.81 MIL/uL 3.30(L) 3.43(L) -  HGB 13.0 - 17.0 g/dL 10.5(L) 10.7(L) 10.0(L)  HCT 39.0 - 52.0 % 32.0(L) 33.3(L) 30.4(L)  PLT 150 - 400 K/uL 274 294 -  NEUTROABS 1.7 - 7.7 K/uL 9.6(H) 7.6 -  LYMPHSABS 0.7 - 4.0 K/uL 2.0 2.1 -     There is no height or weight on file to calculate BMI.  Orders:  No orders of the defined types were placed in this encounter.  No orders of the defined types were placed in this encounter.  Procedures: No procedures performed  Clinical Data: No additional findings.  ROS:  All other systems negative, except as noted in the HPI. Review of Systems  Constitutional:  Negative for chills and fever.  Cardiovascular:  Positive for leg swelling.  Skin:  Positive for color change and wound.  Neurological:  Negative for numbness.   Objective: Vital Signs: There were no vitals taken for this visit.  Specialty Comments:  No specialty comments available.  PMFS History: Patient Active Problem List   Diagnosis Date Noted   Cellulitis of right foot 04/17/2021   Osteomyelitis ([place]) 04/14/2021   Right foot infection    Diabetic foot infection ([place]) 04/08/2021   HTN (hypertension) 04/08/2021   HLD (hyperlipidemia) 04/08/2021   Alcohol abuse     Cellulitis    Bilateral leg edema 02/07/2021   Anasarca    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 2 diabetes mellitus, limited to breakdown of skin ([place])    Hypokalemia 01/28/2014   DM (diabetes mellitus), type 2, uncontrolled 01/27/2014   AKI (acute kidney injury) ([place]) 01/27/2014   Leukocytosis 01/27/2014   Facial cellulitis 01/26/2014   Dental abscess 01/26/2014   Infected dental carries 01/26/2014   Cellulitis and abscess 01/26/2014   Past Medical History:  Diagnosis Date   Alcohol abuse    Diabetes mellitus type 2 in obese ([place])    HTN (hypertension)     History reviewed. No pertinent family history.  Past Surgical History:  Procedure Laterality Date   I & D EXTREMITY Right 04/18/2021   Procedure: IRRIGATION AND DEBRIDEMENT OF FOOT;  Surgeon: Newt Minion, MD;  Location: [place];  Service: Orthopedics;  Laterality: Right;   I & D EXTREMITY Right 04/20/2021   Procedure: EXCISIONAL DEBRIDEMENT RIGHT FOOT, APPLICATION OF SKIN GRAFT;  Surgeon: Newt Minion, MD;  Location: [place];  Service: Orthopedics;  Laterality: Right;   Social History   Occupational History   Occupation: Dealer  Tobacco Use   Smoking status: Never   Smokeless tobacco: Never  Vaping Use   Vaping Use: Never used  Substance and Sexual Activity   Alcohol use: Yes    Alcohol/week: 2.0 standard drinks    Types: 2 Cans of beer per week    Comment: Occasional   Drug use: No   Sexual activity: Not Currently    Partners: Female

## 2021-11-30 ENCOUNTER — Ambulatory Visit: Payer: Self-pay | Admitting: Family

## 2021-12-09 ENCOUNTER — Emergency Department (HOSPITAL_COMMUNITY)
Admission: EM | Admit: 2021-12-09 | Discharge: 2021-12-09 | Disposition: A | Discharge status: Home / Self Care | Payer: Self-pay | Attending: Emergency Medicine | Admitting: Emergency Medicine

## 2021-12-09 ENCOUNTER — Encounter (HOSPITAL_COMMUNITY): Payer: Self-pay | Admitting: Emergency Medicine

## 2021-12-09 ENCOUNTER — Other Ambulatory Visit: Payer: Self-pay

## 2021-12-09 DIAGNOSIS — X58XXXA Exposure to other specified factors, initial encounter: Secondary | ICD-10-CM | POA: Insufficient documentation

## 2021-12-09 DIAGNOSIS — S0993XA Unspecified injury of face, initial encounter: Secondary | ICD-10-CM | POA: Insufficient documentation

## 2021-12-09 MED ORDER — LIDOCAINE VISCOUS HCL 2 % MT SOLN: 15.0000 mL | OROMUCOSAL | 0 refills | Status: DC | PRN

## 2021-12-09 MED ORDER — AMOXICILLIN-POT CLAVULANATE 875-125 MG PO TABS: 1.0000 | ORAL_TABLET | Freq: Two times a day (BID) | ORAL | 0 refills | Status: DC

## 2021-12-09 NOTE — Discharge Instructions (Signed)
Call the dentist listed below today to set up a follow-up appointment. Take antibiotics as prescribed.  Take entire course. Use viscous lidocaine to help numb the area so does not hurt until you are able to follow-up with a dentist. Eat soft foods and chew on the other side until dentist has managed the tooth.  Return to the emergency room with any new, worsening, concerning symptoms

## 2021-12-09 NOTE — ED Provider Notes (Signed)
[facility] EMERGENCY DEPARTMENT Provider Note   CSN: [id number] Arrival date & time: 12/09/21  1443     History  Chief Complaint  Patient presents with   Dental Pain    Jesse [name] is a 61 y.o. male presenting for evaluation of dental pain.  Patient states today he was eating when he felt his right lower tooth crack.  Since then, it part of the tooth is still present and is wiggling and causes pain when he tries to bite.  He has not taken anything for it.  He has had issues with this tooth before, but is never correct like this.  He does not currently have a dentist.  No injury or issues with other teeth currently  HPI     Home Medications Prior to Admission medications   Medication Sig Start Date End Date Taking? Authorizing Provider  amoxicillin-clavulanate (AUGMENTIN) 875-125 MG tablet Take 1 tablet by mouth every 12 (twelve) hours. 12/09/21  Yes Terrez Ander, PA-C  lidocaine (XYLOCAINE) 2 % solution Use as directed 15 mLs in the mouth or throat as needed for mouth pain. 12/09/21  Yes Treana Lacour, PA-C  Accu-Chek Softclix Lancets lancets Use as directed. 04/20/21   Patrecia Pour, MD  atorvastatin (LIPITOR) 40 MG tablet Take 1 tablet (40 mg total) by mouth daily. 04/20/21   Patrecia Pour, MD  Blood Glucose Monitoring Suppl (ACCU-CHEK GUIDE) w/Device KIT Use as directed 04/20/21   Patrecia Pour, MD  glucose blood (ACCU-CHEK GUIDE) test strip Use as instructed 04/20/21   Patrecia Pour, MD  insulin glargine (LANTUS) 100 UNIT/ML Solostar Pen INJECT 10 UNITS INTO THE SKIN DAILY. 04/20/21   Patrecia Pour, MD  Insulin Pen Needle 31G X 8 MM MISC USE AS DIRECTED 02/09/21 02/09/22  Simmons-Robinson, Riki Sheer, MD  losartan (COZAAR) 25 MG tablet TAKE 1 TABLET (25 MG TOTAL) BY MOUTH DAILY. 04/20/21 04/20/22  Patrecia Pour, MD  oxyCODONE-acetaminophen (PERCOCET/ROXICET) 5-325 MG tablet Take 1 tablet by mouth every 4 (four) hours as needed for severe pain. 04/30/21   Newt Minion, MD  sulfamethoxazole-trimethoprim (BACTRIM DS) 800-160 MG tablet Take 1 tablet by mouth 2 (two) times daily. 08/15/21   Suzan Slick, NP  TRUEplus Lancets 28G MISC USE UP TO FOUR TIMES DAILY AS DIRECTED 02/09/21 02/09/22  Simmons-Robinson, Riki Sheer, MD  glipiZIDE (GLUCOTROL) 5 MG tablet Take 1 tablet (5 mg total) by mouth daily before breakfast. Patient not taking: Reported on 01/14/2016 01/31/14 01/14/16  Delfina Redwood, MD      Allergies    Patient has no known allergies.    Review of Systems   Review of Systems  Constitutional:  Negative for fever.  HENT:  Positive for dental problem.    Physical Exam Updated Vital Signs BP (!) 184/103 (BP Location: Right Arm)    Pulse 93    Temp 98.2 F (36.8 C) (Oral)    Resp 18    SpO2 100%  Physical Exam Vitals and nursing note reviewed.  Constitutional:      General: He is not in acute distress.    Appearance: He is well-developed.  HENT:     Head: Normocephalic and atraumatic.     Mouth/Throat:     Dentition: Abnormal dentition. Dental tenderness and dental caries present.      Comments: Multiple teeth missing.  Majority of remaining teeth have significant caries. Right lower tooth cracked vertically, medial half is present.  It is loose.  Base of the tooth is rotten. Eyes:     Extraocular Movements: Extraocular movements intact.  Cardiovascular:     Rate and Rhythm: Normal rate.  Pulmonary:     Effort: Pulmonary effort is normal.  Abdominal:     General: There is no distension.  Musculoskeletal:        General: Normal range of motion.     Cervical back: Normal range of motion.  Skin:    General: Skin is warm.     Findings: No rash.  Neurological:     Mental Status: He is alert and oriented to person, place, and time.    ED Results / Procedures / Treatments   Labs (all labs ordered are listed, but only abnormal results are displayed) Labs Reviewed - No data to display  EKG None  Radiology No results  found.  Procedures Procedures    Medications Ordered in ED Medications - No data to display  ED Course/ Medical Decision Making/ A&P                           Medical Decision Making   This patient presents to the ED for concern of right lower tooth pain and injury.  This involves a number of treatment options, and is a complaint that carries with it a moderate risk of complications and morbidity.  The differential diagnosis includes dental infection, dental fracture, dental nerve exposure.  Disposition:  After consideration of the diagnostic results and the patients response to treatment, I feel that the patent would benefit from close follow-up with dentistry.  Unfortunately patient's tooth does not have any close teeth to try and brace.  The base is rotting, tooth is not implantable.  While it is loose, it is not easily pulled.  Discussed with patient importance of close dentistry follow-up.  Will give viscous lidocaine for pain.  As there is high risk for infection, will give antibiotics.  At this time, patient appears safe for discharge.  Return precautions given.  Patient states he understands and agrees to plan   Final Clinical Impression(s) / ED Diagnoses Final diagnoses:  Injury of tooth, initial encounter    Rx / DC Orders ED Discharge Orders          Ordered    lidocaine (XYLOCAINE) 2 % solution  As needed        12/09/21 1540    amoxicillin-clavulanate (AUGMENTIN) 875-125 MG tablet  Every 12 hours        12/09/21 Canon, Alejandrina Raimer, PA-C 12/09/21 1542    Hayden Rasmussen, MD 12/10/21 [phone]

## 2021-12-09 NOTE — ED Triage Notes (Signed)
Pt states he chipped R lower tooth while eating chicken today.

## 2021-12-11 ENCOUNTER — Ambulatory Visit (INDEPENDENT_AMBULATORY_CARE_PROVIDER_SITE_OTHER): Payer: Self-pay | Admitting: Family

## 2021-12-11 ENCOUNTER — Other Ambulatory Visit: Payer: Self-pay

## 2021-12-11 DIAGNOSIS — L98499 Non-pressure chronic ulcer of skin of other sites with unspecified severity: Secondary | ICD-10-CM

## 2021-12-11 DIAGNOSIS — Z945 Skin transplant status: Secondary | ICD-10-CM

## 2021-12-11 DIAGNOSIS — R600 Localized edema: Secondary | ICD-10-CM

## 2021-12-11 DIAGNOSIS — I872 Venous insufficiency (chronic) (peripheral): Secondary | ICD-10-CM

## 2021-12-12 ENCOUNTER — Encounter: Payer: Self-pay | Admitting: Family

## 2021-12-12 NOTE — Progress Notes (Signed)
Office Visit Note   Patient: Jesse [name]           Date of Birth: [birth year]           MRN: [id number] Visit Date: 12/11/2021              Requested by: No referring provider defined for this encounter. PCP: Pcp, No  Chief Complaint  Patient presents with   Right Foot - Follow-up     04/20/21 debridement right foot Kerecis graft application [id number] in office Epicord application           HPI: The patient is a 61 year old gentleman who presents today in follow-up.    He is status post excisional debridement of the right foot with application of skin graft on 06/22. He is status post Kerecis skin graft in June as well as an in office epi cord placement in 7/22.  He has been using the medical compression stocking with direct skin contact.   Is now having issues with weeping edema to LLE with open ulcers. These are small, new and distressing to Jesse [name].  Assessment & Plan: Visit Diagnoses:  No diagnosis found.   Plan:  iodosorb dressing to the right foot today. have recommended daily dial soap cleansing. Vive socks to bilateral LEs with direct skin contact.  Given a knee high XL vive stocking for the LLE. The patient is to use this around clock for ulcers on left.  Follow-Up Instructions: No follow-ups on file.   Ortho Exam  Patient is alert, oriented, no adenopathy, well-dressed, normal affect, normal respiratory effort.    On examination bilateral lower extremity pitting edema dimpling of skin up to the tibial tubercles bilaterally, pitting edema.  On left has multiple open areas, no larger than 5 mm in diameter with dried serous drainage. No odor. No erythema or warmth.  On examination of right foot, plantar ulcer with flat pink tissue surrounding dried blood. This is stable. There is no surrounding erythema no epiboly no drainage no odor. This was debrided of nonviable tissue with a 10 blade knife back to viable tissue. No sign of infection.  Imaging: No  results found. No images are attached to the encounter.  Labs: Lab Results  Component Value Date   HGBA1C 10.9 (H) 04/14/2021   HGBA1C 10.5 (H) 02/07/2021   HGBA1C 10.6 (H) 02/07/2021   ESRSEDRATE 108 (H) 04/08/2021   ESRSEDRATE 28 (H) 01/14/2016   CRP 27.2 (H) 04/08/2021   LABURIC 7.0 01/14/2016   REPTSTATUS 04/23/2021 FINAL 04/18/2021   GRAMSTAIN NO WBC SEEN NO ORGANISMS SEEN  04/18/2021   CULT  04/18/2021    RARE PROTEUS MIRABILIS WITHIN MIXED ORGANISMS NO ANAEROBES ISOLATED Performed at [facility], [place] [address], [place], [place] [address]    LABORGA PROTEUS MIRABILIS 04/18/2021     Lab Results  Component Value Date   ALBUMIN 2.9 (L) 04/18/2021   ALBUMIN 2.9 (L) 04/14/2021   ALBUMIN 3.0 (L) 04/08/2021    Lab Results  Component Value Date   MG 1.8 04/18/2021   MG 1.9 04/14/2021   MG 1.6 (L) 02/09/2021   No results found for: VD25OH  No results found for: PREALBUMIN CBC EXTENDED Latest Ref Rng & Units 04/25/2021 04/24/2021 04/23/2021  WBC 4.0 - 10.5 K/uL 13.5(H) 11.1(H) -  RBC 4.22 - 5.81 MIL/uL 3.30(L) 3.43(L) -  HGB 13.0 - 17.0 g/dL 10.5(L) 10.7(L) 10.0(L)  HCT 39.0 - 52.0 % 32.0(L) 33.3(L) 30.4(L)  PLT 150 - 400  K/uL 274 294 -  NEUTROABS 1.7 - 7.7 K/uL 9.6(H) 7.6 -  LYMPHSABS 0.7 - 4.0 K/uL 2.0 2.1 -     There is no height or weight on file to calculate BMI.  Orders:  No orders of the defined types were placed in this encounter.  No orders of the defined types were placed in this encounter.    Procedures: No procedures performed  Clinical Data: No additional findings.  ROS:  All other systems negative, except as noted in the HPI. Review of Systems  Constitutional:  Negative for chills and fever.  Cardiovascular:  Positive for leg swelling.  Skin:  Positive for color change and wound.  Neurological:  Negative for numbness.   Objective: Vital Signs: There were no vitals taken for this visit.  Specialty Comments:  No specialty  comments available.  PMFS History: Patient Active Problem List   Diagnosis Date Noted   Cellulitis of right foot 04/17/2021   Osteomyelitis ([place]) 04/14/2021   Right foot infection    Diabetic foot infection ([place]) 04/08/2021   HTN (hypertension) 04/08/2021   HLD (hyperlipidemia) 04/08/2021   Alcohol abuse    Cellulitis    Bilateral leg edema 02/07/2021   Anasarca    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 2 diabetes mellitus, limited to breakdown of skin ([place])    Hypokalemia 01/28/2014   DM (diabetes mellitus), type 2, uncontrolled 01/27/2014   AKI (acute kidney injury) ([place]) 01/27/2014   Leukocytosis 01/27/2014   Facial cellulitis 01/26/2014   Dental abscess 01/26/2014   Infected dental carries 01/26/2014   Cellulitis and abscess 01/26/2014   Past Medical History:  Diagnosis Date   Alcohol abuse    Diabetes mellitus type 2 in obese ([place])    HTN (hypertension)     No family history on file.  Past Surgical History:  Procedure Laterality Date   I & D EXTREMITY Right 04/18/2021   Procedure: IRRIGATION AND DEBRIDEMENT OF FOOT;  Surgeon: Newt Minion, MD;  Location: [place];  Service: Orthopedics;  Laterality: Right;   I & D EXTREMITY Right 04/20/2021   Procedure: EXCISIONAL DEBRIDEMENT RIGHT FOOT, APPLICATION OF SKIN GRAFT;  Surgeon: Newt Minion, MD;  Location: [place];  Service: Orthopedics;  Laterality: Right;   Social History   Occupational History   Occupation: Dealer  Tobacco Use   Smoking status: Never   Smokeless tobacco: Never  Vaping Use   Vaping Use: Never used  Substance and Sexual Activity   Alcohol use: Yes    Alcohol/week: 2.0 standard drinks    Types: 2 Cans of beer per week    Comment: Occasional   Drug use: No   Sexual activity: Not Currently    Partners: Female

## 2021-12-18 ENCOUNTER — Telehealth: Payer: Self-pay

## 2021-12-18 NOTE — Telephone Encounter (Signed)
Pt called seeing if he can get a call back regarding his feet he feels as if something just isn't right he said he feet are moving involuntary

## 2021-12-18 NOTE — Telephone Encounter (Signed)
Can you please call pt and see if he can come in tomorrow for an appt with [name]? I can open a time if needed. We would have to see him to figure out what could be happening.

## 2021-12-25 ENCOUNTER — Ambulatory Visit: Payer: Self-pay | Admitting: Family

## 2022-01-02 ENCOUNTER — Ambulatory Visit: Payer: Self-pay | Admitting: Family

## 2022-01-08 ENCOUNTER — Ambulatory Visit: Payer: Self-pay | Admitting: Family

## 2022-01-09 ENCOUNTER — Encounter: Payer: Self-pay | Admitting: Family

## 2022-01-09 ENCOUNTER — Ambulatory Visit (INDEPENDENT_AMBULATORY_CARE_PROVIDER_SITE_OTHER): Payer: Self-pay | Admitting: Family

## 2022-01-09 DIAGNOSIS — Z945 Skin transplant status: Secondary | ICD-10-CM

## 2022-01-09 DIAGNOSIS — E1165 Type 2 diabetes mellitus with hyperglycemia: Secondary | ICD-10-CM

## 2022-01-09 DIAGNOSIS — L97411 Non-pressure chronic ulcer of right heel and midfoot limited to breakdown of skin: Secondary | ICD-10-CM

## 2022-01-09 DIAGNOSIS — R600 Localized edema: Secondary | ICD-10-CM

## 2022-01-09 NOTE — Progress Notes (Signed)
? ?Office Visit Note ?  ?Patient: Jesse [name]           ?Date of Birth: 1961-04-04           ?MRN: [id number] ?Visit Date: 01/09/2022 ?             ?Requested by: No referring provider defined for this encounter. ?PCP: Pcp, No ? ?Chief Complaint  ?Patient presents with  ? Right Foot - Wound Check  ? ? ? ? ?HPI: ?The patient is a 61 year old in follow-up.  He is status post excisional debridement with Kerecis graft as well as a history of epi cord placement for a plantar ulcer on the right foot.  Last graft placement was in July of last year.  Gradual worsening of the wound over the last 2 months.  He continues with persistent edema in the lower extremities right worse than left.  Today he is in regular cotton socks full weightbearing in a postop shoe on the right ? ?Assessment & Plan: ?Visit Diagnoses: No diagnosis found. ? ?Plan: Unfortunately has not been consistent with compression garments.  Today we will place compression wrap on the right with silver cell over the wound.  Requested he follow-up on Monday for possible consideration in the office Kerecis application.  Discussed again the importance of minimizing pressure on the foot minimizing weightbearing ? ?Follow-Up Instructions: Return in about 5 days (around 01/14/2022).  ? ?Ortho Exam ? ?Patient is alert, oriented, no adenopathy, well-dressed, normal affect, normal respiratory effort. ?On examination of the right lower extremity he has 2+ deep superficial.  There is no erythema no weeping.  He does have a plantar ulcer over the midfoot this is 2 cm in diameter 5 mm deep there is flattening tissue there is no active drainage no surrounding erythema no drainage no odor ? ?Surrounding nonviable tissue was debrided with a 10 blade knife after consent. ? ?Imaging: ?No results found. ?No images are attached to the encounter. ? ?Labs: ?Lab Results  ?Component Value Date  ? HGBA1C 10.9 (H) 04/14/2021  ? HGBA1C 10.5 (H) 02/07/2021  ? HGBA1C 10.6 (H) 02/07/2021  ?  ESRSEDRATE 108 (H) 04/08/2021  ? ESRSEDRATE 28 (H) 01/14/2016  ? CRP 27.2 (H) 04/08/2021  ? LABURIC 7.0 01/14/2016  ? REPTSTATUS 04/23/2021 FINAL 04/18/2021  ? GRAMSTAIN NO WBC SEEN ?NO ORGANISMS SEEN ? 04/18/2021  ? CULT  04/18/2021  ?  RARE PROTEUS MIRABILIS ?WITHIN MIXED ORGANISMS ?NO ANAEROBES ISOLATED ?Performed at [facility], [place] [address], [place], [place] [address] ?  ? [place] 04/18/2021  ? ? ? ?Lab Results  ?Component Value Date  ? ALBUMIN 2.9 (L) 04/18/2021  ? ALBUMIN 2.9 (L) 04/14/2021  ? ALBUMIN 3.0 (L) 04/08/2021  ? ? ?Lab Results  ?Component Value Date  ? MG 1.8 04/18/2021  ? MG 1.9 04/14/2021  ? MG 1.6 (L) 02/09/2021  ? ?No results found for: VD25OH ? ?No results found for: PREALBUMIN ?CBC EXTENDED Latest Ref Rng & Units 04/25/2021 04/24/2021 04/23/2021  ?WBC 4.0 - 10.5 K/uL 13.5(H) 11.1(H) -  ?RBC 4.22 - 5.81 MIL/uL 3.30(L) 3.43(L) -  ?HGB 13.0 - 17.0 g/dL 10.5(L) 10.7(L) 10.0(L)  ?HCT 39.0 - 52.0 % 32.0(L) 33.3(L) 30.4(L)  ?PLT 150 - 400 K/uL 274 294 -  ?NEUTROABS 1.7 - 7.7 K/uL 9.6(H) 7.6 -  ?LYMPHSABS 0.7 - 4.0 K/uL 2.0 2.1 -  ? ? ? ?There is no height or weight on file to calculate BMI. ? ?Orders:  ?No  orders of the defined types were placed in this encounter. ? ?No orders of the defined types were placed in this encounter. ? ? ? Procedures: ?No procedures performed ? ?Clinical Data: ?No additional findings. ? ?ROS: ? ?All other systems negative, except as noted in the HPI. ?Review of Systems ? ?Objective: ?Vital Signs: There were no vitals taken for this visit. ? ?Specialty Comments:  ?No specialty comments available. ? ?PMFS History: ?Patient Active Problem List  ? Diagnosis Date Noted  ? Cellulitis of right foot 04/17/2021  ? Osteomyelitis ([place]) 04/14/2021  ? Right foot infection   ? Diabetic foot infection ([place]) 04/08/2021  ? HTN (hypertension) 04/08/2021  ? HLD (hyperlipidemia) 04/08/2021  ? Alcohol abuse   ? Cellulitis   ? Bilateral leg edema 02/07/2021  ? Anasarca    ? 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 2 diabetes mellitus, limited to breakdown of skin ([place])   ? Hypokalemia 01/28/2014  ? DM (diabetes mellitus), type 2, uncontrolled 01/27/2014  ? AKI (acute kidney injury) ([place]) 01/27/2014  ? Leukocytosis 01/27/2014  ? Facial cellulitis 01/26/2014  ? Dental abscess 01/26/2014  ? Infected dental carries 01/26/2014  ? Cellulitis and abscess 01/26/2014  ? ?Past Medical History:  ?Diagnosis Date  ? Alcohol abuse   ? Diabetes mellitus type 2 in obese [facility])   ? HTN (hypertension)   ?  ?History reviewed. No pertinent family history.  ?Past Surgical History:  ?Procedure Laterality Date  ? I & D EXTREMITY Right 04/18/2021  ? Procedure: IRRIGATION AND DEBRIDEMENT OF FOOT;  Surgeon: Newt Minion, MD;  Location: [place];  Service: Orthopedics;  Laterality: Right;  ? I & D EXTREMITY Right 04/20/2021  ? Procedure: EXCISIONAL DEBRIDEMENT RIGHT FOOT, APPLICATION OF SKIN GRAFT;  Surgeon: Newt Minion, MD;  Location: [place];  Service: Orthopedics;  Laterality: Right;  ? ?Social History  ? ?Occupational History  ? Occupation: Dealer  ?Tobacco Use  ? Smoking status: Never  ? Smokeless tobacco: Never  ?Vaping Use  ? Vaping Use: Never used  ?Substance and Sexual Activity  ? Alcohol use: Yes  ?  Alcohol/week: 2.0 standard drinks  ?  Types: 2 Cans of beer per week  ?  Comment: Occasional  ? Drug use: No  ? Sexual activity: Not Currently  ?  Partners: Female  ? ? ? ? ? ?

## 2022-01-14 ENCOUNTER — Ambulatory Visit: Payer: Self-pay | Admitting: Orthopedic Surgery

## 2022-01-22 ENCOUNTER — Ambulatory Visit: Payer: Self-pay | Admitting: Orthopedic Surgery

## 2022-01-28 ENCOUNTER — Ambulatory Visit (INDEPENDENT_AMBULATORY_CARE_PROVIDER_SITE_OTHER): Payer: Self-pay | Admitting: Orthopedic Surgery

## 2022-01-28 DIAGNOSIS — Z945 Skin transplant status: Secondary | ICD-10-CM

## 2022-01-28 DIAGNOSIS — R600 Localized edema: Secondary | ICD-10-CM

## 2022-01-29 ENCOUNTER — Encounter: Payer: Self-pay | Admitting: Orthopedic Surgery

## 2022-01-29 NOTE — Progress Notes (Signed)
? ?Office Visit Note ?  ?Patient: Jesse [name]           ?Date of Birth: 1961-08-28           ?MRN: [id number] ?Visit Date: 01/28/2022 ?             ?Requested by: No referring provider defined for this encounter. ?PCP: Pcp, No ? ?Chief Complaint  ?Patient presents with  ? Right Foot - Wound Check  ? ? ? ? ?HPI: ?Patient is a 61 year old gentleman who is seen in follow-up for ulceration with venous stasis insufficiency bilateral lower extremities.  Patient is status post debridement of a large plantar wound and placement of Kerecis graft and epi cord on the plantar ulcer.  Last graft application was approximately 8 months ago. ? ?Assessment & Plan: ?Visit Diagnoses:  ?1. History of skin graft   ?2. Bilateral leg edema   ? ? ?Plan: The wound bed is healthy recommended pressure offloading cleansing with soap and water reevaluate in 4 weeks. ? ?Follow-Up Instructions: Return in about 4 weeks (around 02/25/2022).  ? ?Ortho Exam ? ?Patient is alert, oriented, no adenopathy, well-dressed, normal affect, normal respiratory effort. ?Examination patient has had excellent healing of the majority of the wound.  He now has a ulcer on the plantar aspect of the right foot that is 10 mm in diameter and 2 mm deep there is healthy bleeding granulation tissue at the wound base.  There is no depth no exposed bone or tendon no undermining. ? ?Imaging: ?No results found. ? ? ?Labs: ?Lab Results  ?Component Value Date  ? HGBA1C 10.9 (H) 04/14/2021  ? HGBA1C 10.5 (H) 02/07/2021  ? HGBA1C 10.6 (H) 02/07/2021  ? ESRSEDRATE 108 (H) 04/08/2021  ? ESRSEDRATE 28 (H) 01/14/2016  ? CRP 27.2 (H) 04/08/2021  ? LABURIC 7.0 01/14/2016  ? REPTSTATUS 04/23/2021 FINAL 04/18/2021  ? GRAMSTAIN NO WBC SEEN ?NO ORGANISMS SEEN ? 04/18/2021  ? CULT  04/18/2021  ?  RARE PROTEUS MIRABILIS ?WITHIN MIXED ORGANISMS ?NO ANAEROBES ISOLATED ?Performed at [facility], [place] [address] [place], [place] [address] ?  ? [place] 04/18/2021   ? ? ? ?Lab Results  ?Component Value Date  ? ALBUMIN 2.9 (L) 04/18/2021  ? ALBUMIN 2.9 (L) 04/14/2021  ? ALBUMIN 3.0 (L) 04/08/2021  ? ? ?Lab Results  ?Component Value Date  ? MG 1.8 04/18/2021  ? MG 1.9 04/14/2021  ? MG 1.6 (L) 02/09/2021  ? ?No results found for: VD25OH ? ?No results found for: PREALBUMIN ?CBC EXTENDED Latest Ref Rng & Units 04/25/2021 04/24/2021 04/23/2021  ?WBC 4.0 - 10.5 K/uL 13.5(H) 11.1(H) -  ?RBC 4.22 - 5.81 MIL/uL 3.30(L) 3.43(L) -  ?HGB 13.0 - 17.0 g/dL 10.5(L) 10.7(L) 10.0(L)  ?HCT 39.0 - 52.0 % 32.0(L) 33.3(L) 30.4(L)  ?PLT 150 - 400 K/uL 274 294 -  ?NEUTROABS 1.7 - 7.7 K/uL 9.6(H) 7.6 -  ?LYMPHSABS 0.7 - 4.0 K/uL 2.0 2.1 -  ? ? ? ?There is no height or weight on file to calculate BMI. ? ?Orders:  ?No orders of the defined types were placed in this encounter. ? ?No orders of the defined types were placed in this encounter. ? ? ? Procedures: ?No procedures performed ? ?Clinical Data: ?No additional findings. ? ?ROS: ? ?All other systems negative, except as noted in the HPI. ?Review of Systems ? ?Objective: ?Vital Signs: There were no vitals taken for this visit. ? ?Specialty Comments:  ?No specialty comments available. ? ?  PMFS History: ?Patient Active Problem List  ? Diagnosis Date Noted  ? Cellulitis of right foot 04/17/2021  ? Osteomyelitis ([place]) 04/14/2021  ? Right foot infection   ? Diabetic foot infection ([place]) 04/08/2021  ? HTN (hypertension) 04/08/2021  ? HLD (hyperlipidemia) 04/08/2021  ? Alcohol abuse   ? Cellulitis   ? Bilateral leg edema 02/07/2021  ? Anasarca   ? 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 2 diabetes mellitus, limited to breakdown of skin ([place])   ? Hypokalemia 01/28/2014  ? DM (diabetes mellitus), type 2, uncontrolled 01/27/2014  ? AKI (acute kidney injury) ([place]) 01/27/2014  ? Leukocytosis 01/27/2014  ? Facial cellulitis 01/26/2014  ? Dental abscess 01/26/2014  ? Infected dental carries 01/26/2014  ? Cellulitis and abscess 01/26/2014  ? ?Past  Medical History:  ?Diagnosis Date  ? Alcohol abuse   ? Diabetes mellitus type 2 in obese [facility])   ? HTN (hypertension)   ?  ?History reviewed. No pertinent family history.  ?Past Surgical History:  ?Procedure Laterality Date  ? I & D EXTREMITY Right 04/18/2021  ? Procedure: IRRIGATION AND DEBRIDEMENT OF FOOT;  Surgeon: Newt Minion, MD;  Location: [facility];  Service: Orthopedics;  Laterality: Right;  ? I & D EXTREMITY Right 04/20/2021  ? Procedure: EXCISIONAL DEBRIDEMENT RIGHT FOOT, APPLICATION OF SKIN GRAFT;  Surgeon: Newt Minion, MD;  Location: [place];  Service: Orthopedics;  Laterality: Right;  ? ?Social History  ? ?Occupational History  ? Occupation: Dealer  ?Tobacco Use  ? Smoking status: Never  ? Smokeless tobacco: Never  ?Vaping Use  ? Vaping Use: Never used  ?Substance and Sexual Activity  ? Alcohol use: Yes  ?  Alcohol/week: 2.0 standard drinks  ?  Types: 2 Cans of beer per week  ?  Comment: Occasional  ? Drug use: No  ? Sexual activity: Not Currently  ?  Partners: Female  ? ? ? ? ? ?

## 2022-02-25 ENCOUNTER — Ambulatory Visit: Payer: Self-pay | Admitting: Orthopedic Surgery

## 2022-02-28 ENCOUNTER — Ambulatory Visit: Payer: Self-pay | Admitting: Orthopedic Surgery

## 2022-03-13 ENCOUNTER — Ambulatory Visit: Payer: Self-pay | Admitting: Family

## 2022-03-20 ENCOUNTER — Ambulatory Visit: Payer: Self-pay | Admitting: Family

## 2022-03-26 ENCOUNTER — Ambulatory Visit (INDEPENDENT_AMBULATORY_CARE_PROVIDER_SITE_OTHER): Payer: Self-pay

## 2022-03-26 ENCOUNTER — Ambulatory Visit (INDEPENDENT_AMBULATORY_CARE_PROVIDER_SITE_OTHER): Payer: Self-pay | Admitting: Family

## 2022-03-26 ENCOUNTER — Encounter: Payer: Self-pay | Admitting: Family

## 2022-03-26 DIAGNOSIS — S99922A Unspecified injury of left foot, initial encounter: Secondary | ICD-10-CM

## 2022-03-26 DIAGNOSIS — E1165 Type 2 diabetes mellitus with hyperglycemia: Secondary | ICD-10-CM

## 2022-03-26 DIAGNOSIS — I739 Peripheral vascular disease, unspecified: Secondary | ICD-10-CM

## 2022-03-26 DIAGNOSIS — R609 Edema, unspecified: Secondary | ICD-10-CM

## 2022-03-26 DIAGNOSIS — Z945 Skin transplant status: Secondary | ICD-10-CM

## 2022-03-26 DIAGNOSIS — R600 Localized edema: Secondary | ICD-10-CM

## 2022-03-26 DIAGNOSIS — L97411 Non-pressure chronic ulcer of right heel and midfoot limited to breakdown of skin: Secondary | ICD-10-CM

## 2022-03-26 MED ORDER — DOXYCYCLINE HYCLATE 100 MG PO TABS: 100.0000 mg | ORAL_TABLET | Freq: Two times a day (BID) | ORAL | 0 refills | Status: DC

## 2022-03-26 MED ORDER — SILVER SULFADIAZINE 1 % EX CREA: 1.0000 "application " | TOPICAL_CREAM | Freq: Every day | CUTANEOUS | 0 refills | Status: DC

## 2022-03-26 NOTE — Progress Notes (Signed)
? ?Office Visit Note ?  ?Patient: Jesse [name]           ?Date of Birth: 11-30-1960           ?MRN: [id number] ?Visit Date: 03/26/2022 ?             ?Requested by: No referring provider defined for this encounter. ?PCP: Pcp, No ? ?No chief complaint on file. ? ? ? ? ?HPI: ?Patient is a 61 year old gentleman who is seen for 2 separate issues.  He has new ulceration over the left great toe he states that he was kneeling down working on a car and dragged his foot across the concrete he is unsure whether this or wearing tight socks has caused him to develop a new ulcer. ? ? ?He is also seen in follow-up for ulceration with venous stasis insufficiency bilateral lower extremities.  Patient is status post debridement of a large plantar wound and placement of Kerecis graft and epi cord on the plantar ulcer.  Last graft application was approximately 6/22.  Has been wearing a compression stocking with direct skin contact over this area.  This is stable. ? ?Assessment & Plan: ?Visit Diagnoses:  ?1. History of skin graft   ?2. Bilateral leg edema   ?3. Uncontrolled type 2 diabetes mellitus with hyperglycemia (HCC)   ?4. Peripheral edema   ?5. Midfoot skin ulcer, right, limited to breakdown of skin (HCC)   ? ? ?Plan: The right foot wound bed is healthy. recommended pressure offloading cleansing with soap and water reevaluate in 4 weeks.  ? ?For the left great toe ulcer will send for an urgent vascular referral.  He has ABIs from March of last year which showed noncompressible vessels on the left. ? ?Follow-Up Instructions: Return in about 4 weeks (around 04/23/2022).  ? ?Ortho Exam ? ?Patient is alert, oriented, no adenopathy, well-dressed, normal affect, normal respiratory effort. ?Examination patient has had healing of the majority of the wound.  Continues with persistent ulcer which continues to be 1 cm in diameter 2 mm deep.  There is flat pink tissue at the base there is no drainage no surrounding erythema there is epiboly  of the edges. There is no depth no exposed bone or tendon no undermining. ? ?On examination of the left foot unfortunately he has degloving of the great toe medial border.  The nail appeals to be impending avulsion.  There is a foul odor there is scant necrotic tissue there is some maceration no active drainage, scant granulation ? ?Faintly palpable dorsalis pedis pulse on the left ? ?Imaging: ?No results found. ? ? ?Labs: ?Lab Results  ?Component Value Date  ? HGBA1C 10.9 (H) 04/14/2021  ? HGBA1C 10.5 (H) 02/07/2021  ? HGBA1C 10.6 (H) 02/07/2021  ? ESRSEDRATE 108 (H) 04/08/2021  ? ESRSEDRATE 28 (H) 01/14/2016  ? CRP 27.2 (H) 04/08/2021  ? LABURIC 7.0 01/14/2016  ? REPTSTATUS 04/23/2021 FINAL 04/18/2021  ? GRAMSTAIN NO WBC SEEN ?NO ORGANISMS SEEN ? 04/18/2021  ? CULT  04/18/2021  ?  RARE PROTEUS MIRABILIS ?WITHIN MIXED ORGANISMS ?NO ANAEROBES ISOLATED ?Performed at [facility], [address], [place], [place] [address] ?  ? [place] PROTEUS MIRABILIS 04/18/2021  ? ? ? ?Lab Results  ?Component Value Date  ? ALBUMIN 2.9 (L) 04/18/2021  ? ALBUMIN 2.9 (L) 04/14/2021  ? ALBUMIN 3.0 (L) 04/08/2021  ? ? ?Lab Results  ?Component Value Date  ? MG 1.8 04/18/2021  ? MG 1.9 04/14/2021  ? MG  1.6 (L) 02/09/2021  ? ?No results found for: VD25OH ? ?No results found for: PREALBUMIN ? ?  Latest Ref Rng & Units 04/25/2021  ?  6:00 PM 04/24/2021  ? 12:02 PM 04/23/2021  ?  2:17 AM  ?CBC EXTENDED  ?WBC 4.0 - 10.5 K/uL 13.5   11.1     ?RBC 4.22 - 5.81 MIL/uL 3.30   3.43     ?Hemoglobin 13.0 - 17.0 g/dL 35.5   73.2   20.2    ?HCT 39.0 - 52.0 % 32.0   33.3   30.4    ?Platelets 150 - 400 K/uL 274   294     ?NEUT# 1.7 - 7.7 K/uL 9.6   7.6     ?Lymph# 0.7 - 4.0 K/uL 2.0   2.1     ? ? ? ?There is no height or weight on file to calculate BMI. ? ?Orders:  ?No orders of the defined types were placed in this encounter. ? ?No orders of the defined types were placed in this encounter. ? ? ? Procedures: ?No procedures performed ? ?Clinical  Data: ?No additional findings. ? ?ROS: ? ?All other systems negative, except as noted in the HPI. ?Review of Systems ? ?Objective: ?Vital Signs: There were no vitals taken for this visit. ? ?Specialty Comments:  ?No specialty comments available. ? ?PMFS History: ?Patient Active Problem List  ? Diagnosis Date Noted  ? Cellulitis of right foot 04/17/2021  ? Osteomyelitis (HCC) 04/14/2021  ? Right foot infection   ? Diabetic foot infection (HCC) 04/08/2021  ? HTN (hypertension) 04/08/2021  ? HLD (hyperlipidemia) 04/08/2021  ? Alcohol abuse   ? Cellulitis   ? Bilateral leg edema 02/07/2021  ? Anasarca   ? 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 2 diabetes mellitus, limited to breakdown of skin (HCC)   ? Hypokalemia 01/28/2014  ? DM (diabetes mellitus), type 2, uncontrolled 01/27/2014  ? AKI (acute kidney injury) (HCC) 01/27/2014  ? Leukocytosis 01/27/2014  ? Facial cellulitis 01/26/2014  ? Dental abscess 01/26/2014  ? Infected dental carries 01/26/2014  ? Cellulitis and abscess 01/26/2014  ? ?Past Medical History:  ?Diagnosis Date  ? Alcohol abuse   ? Diabetes mellitus type 2 in obese [facility])   ? HTN (hypertension)   ?  ?History reviewed. No pertinent family history.  ?Past Surgical History:  ?Procedure Laterality Date  ? I & D EXTREMITY Right 04/18/2021  ? Procedure: IRRIGATION AND DEBRIDEMENT OF FOOT;  Surgeon: Nadara Mustard, MD;  Location: [facility] OR;  Service: Orthopedics;  Laterality: Right;  ? I & D EXTREMITY Right 04/20/2021  ? Procedure: EXCISIONAL DEBRIDEMENT RIGHT FOOT, APPLICATION OF SKIN GRAFT;  Surgeon: Nadara Mustard, MD;  Location: [facility] OR;  Service: Orthopedics;  Laterality: Right;  ? ?Social History  ? ?Occupational History  ? Occupation: Curator  ?Tobacco Use  ? Smoking status: Never  ? Smokeless tobacco: Never  ?Vaping Use  ? Vaping Use: Never used  ?Substance and Sexual Activity  ? Alcohol use: Yes  ?  Alcohol/week: 2.0 standard drinks  ?  Types: 2 Cans of beer per week  ?  Comment: Occasional   ? Drug use: No  ? Sexual activity: Not Currently  ?  Partners: Female  ? ? ? ? ? ?

## 2022-03-27 ENCOUNTER — Other Ambulatory Visit: Payer: Self-pay | Admitting: *Deleted

## 2022-03-27 DIAGNOSIS — E11621 Type 2 diabetes mellitus with foot ulcer: Secondary | ICD-10-CM

## 2022-04-02 NOTE — Progress Notes (Unsigned)
Office Note     CC:  L great toe ulceration Requesting Provider:  Suzan Slick, NP  HPI: Jesse [name] is a 61 y.o. (03-Nov-1961) male presenting at the request of .Pcp, No ***  Right great toe ulceration  The pt is *** on a statin for cholesterol management.  The pt is *** on a daily aspirin.   Other AC:  *** The pt is *** on medication for hypertension.   The pt is *** diabetic.  Tobacco hx:  ***  Past Medical History:  Diagnosis Date   Alcohol abuse    Diabetes mellitus type 2 in obese ([place])    HTN (hypertension)     Past Surgical History:  Procedure Laterality Date   I & D EXTREMITY Right 04/18/2021   Procedure: IRRIGATION AND DEBRIDEMENT OF FOOT;  Surgeon: Newt Minion, MD;  Location: [place];  Service: Orthopedics;  Laterality: Right;   I & D EXTREMITY Right 04/20/2021   Procedure: EXCISIONAL DEBRIDEMENT RIGHT FOOT, APPLICATION OF SKIN GRAFT;  Surgeon: Newt Minion, MD;  Location: [place];  Service: Orthopedics;  Laterality: Right;    Social History   Socioeconomic History   Marital status: Single    Spouse name: Not on file   Number of children: Not on file   Years of education: 10th grade   Highest education level: Not on file  Occupational History   Occupation: Dealer  Tobacco Use   Smoking status: Never   Smokeless tobacco: Never  Vaping Use   Vaping Use: Never used  Substance and Sexual Activity   Alcohol use: Yes    Alcohol/week: 2.0 standard drinks    Types: 2 Cans of beer per week    Comment: Occasional   Drug use: No   Sexual activity: Not Currently    Partners: Female  Other Topics Concern   Not on file  Social History Narrative   Not on file   Social Determinants of Health   Financial Resource Strain: Not on file  Food Insecurity: Not on file  Transportation Needs: Not on file  Physical Activity: Not on file  Stress: Not on file  Social Connections: Not on file  Intimate Partner Violence: Not on file   ***No family history on  file.  Current Outpatient Medications  Medication Sig Dispense Refill   Accu-Chek Softclix Lancets lancets Use as directed. 100 each 5   amoxicillin-clavulanate (AUGMENTIN) 875-125 MG tablet Take 1 tablet by mouth every 12 (twelve) hours. 14 tablet 0   atorvastatin (LIPITOR) 40 MG tablet Take 1 tablet (40 mg total) by mouth daily. 30 tablet 0   Blood Glucose Monitoring Suppl (ACCU-CHEK GUIDE) w/Device KIT Use as directed 1 kit 0   doxycycline (VIBRA-TABS) 100 MG tablet Take 1 tablet (100 mg total) by mouth 2 (two) times daily. 60 tablet 0   glucose blood (ACCU-CHEK GUIDE) test strip Use as instructed 100 each 12   insulin glargine (LANTUS) 100 UNIT/ML Solostar Pen INJECT 10 UNITS INTO THE SKIN DAILY. 6 mL 0   lidocaine (XYLOCAINE) 2 % solution Use as directed 15 mLs in the mouth or throat as needed for mouth pain. 100 mL 0   losartan (COZAAR) 25 MG tablet TAKE 1 TABLET (25 MG TOTAL) BY MOUTH DAILY. 30 tablet 0   oxyCODONE-acetaminophen (PERCOCET/ROXICET) 5-325 MG tablet Take 1 tablet by mouth every 4 (four) hours as needed for severe pain. 30 tablet 0   silver sulfADIAZINE (SILVADENE) 1 % cream Apply 1 application.  topically daily. 50 g 0   No current facility-administered medications for this visit.    No Known Allergies   REVIEW OF SYSTEMS:  *** _0  denotes positive finding, _1  denotes negative finding Cardiac  Comments:  Chest pain or chest pressure:    Shortness of breath upon exertion:    Short of breath when lying flat:    Irregular heart rhythm:        Vascular    Pain in calf, thigh, or hip brought on by ambulation:    Pain in feet at night that wakes you up from your sleep:     Blood clot in your veins:    Leg swelling:         Pulmonary    Oxygen at home:    Productive cough:     Wheezing:         Neurologic    Sudden weakness in arms or legs:     Sudden numbness in arms or legs:     Sudden onset of difficulty speaking or slurred speech:    Temporary loss of  vision in one eye:     Problems with dizziness:         Gastrointestinal    Blood in stool:     Vomited blood:         Genitourinary    Burning when urinating:     Blood in urine:        Psychiatric    Major depression:         Hematologic    Bleeding problems:    Problems with blood clotting too easily:        Skin    Rashes or ulcers:        Constitutional    Fever or chills:      PHYSICAL EXAMINATION:  There were no vitals filed for this visit.  General:  WDWN in NAD; vital signs documented above Gait: Not observed HENT: WNL, normocephalic Pulmonary: normal non-labored breathing , without wheezing Cardiac: {Desc; regular/irreg:14544} HR, bruit*** Abdomen: soft, NT, no masses Skin: {With/Without:20273} rashes Vascular Exam/Pulses:  Right Left  Radial {Exam; arterial pulse strength 0-4:30167} {Exam; arterial pulse strength 0-4:30167}  Ulnar {Exam; arterial pulse strength 0-4:30167} {Exam; arterial pulse strength 0-4:30167}  Femoral {Exam; arterial pulse strength 0-4:30167} {Exam; arterial pulse strength 0-4:30167}  Popliteal {Exam; arterial pulse strength 0-4:30167} {Exam; arterial pulse strength 0-4:30167}  DP {Exam; arterial pulse strength 0-4:30167} {Exam; arterial pulse strength 0-4:30167}  PT {Exam; arterial pulse strength 0-4:30167} {Exam; arterial pulse strength 0-4:30167}   Extremities: {With/Without:20273} ischemic changes, {With/Without:20273} Gangrene , {With/Without:20273} cellulitis; {With/Without:20273} open wounds;  Musculoskeletal: no muscle wasting or atrophy  Neurologic: A&O X 3;  No focal weakness or paresthesias are detected Psychiatric:  The pt has {Desc; normal/abnormal:11317::"Normal"} affect.   Non-Invasive Vascular Imaging:   ***    ASSESSMENT/PLAN: Jesse [name] is a 61 y.o. male presenting with ***   ***   Jesse John, MD [facility] [phone]

## 2022-04-03 ENCOUNTER — Encounter: Payer: Self-pay | Admitting: Family

## 2022-04-03 ENCOUNTER — Ambulatory Visit (INDEPENDENT_AMBULATORY_CARE_PROVIDER_SITE_OTHER): Payer: Self-pay | Admitting: Family

## 2022-04-03 DIAGNOSIS — E1165 Type 2 diabetes mellitus with hyperglycemia: Secondary | ICD-10-CM

## 2022-04-03 DIAGNOSIS — Z945 Skin transplant status: Secondary | ICD-10-CM

## 2022-04-03 DIAGNOSIS — R600 Localized edema: Secondary | ICD-10-CM

## 2022-04-03 DIAGNOSIS — S99922A Unspecified injury of left foot, initial encounter: Secondary | ICD-10-CM

## 2022-04-03 NOTE — Progress Notes (Signed)
Office Visit Note   Patient: Jesse [name]           Date of Birth: 08-Dec-1960           MRN: [id number] Visit Date: 04/03/2022              Requested by: No referring provider defined for this encounter. PCP: Pcp, No  Chief Complaint  Patient presents with   Right Foot - Follow-up    Hx excisional deb with kerecis graft [id number] In office application Epicord [id number]   Left Foot - Wound Check    Ulcer GT       HPI: The patient is a 61 year old gentleman who is seen today in routine follow-up.  For 2 separate issues.  #1 he is status post debridement of a large plantar wound with placement of Kerecis graft as well as epi cord grafting the last application was June of last year he has been wearing a compression stocking with direct skin contact over the area this is unchanged  #2 he is seen in follow-up for ulceration over the left great toe.  About 2 weeks ago now he states that he was working and dragged his medial column across some concrete as he was working on a car.  He was also wearing tight compression stocks he is unsure which of these events may have caused the ulceration at last visit he had maceration and what appeared to be impending degloving of the entire great toe today continues with some maceration and a loose nail plate however things appear to be improving  He states he has a appointment with vascular for evaluation of the left lower extremity tomorrow He is also not Assessment & Plan: Visit Diagnoses:  1. History of skin graft   2. Bilateral leg edema   3. Toe injury, left, initial encounter   4. Uncontrolled type 2 diabetes mellitus with hyperglycemia ([place])     Plan: Left great toenail trimmed today.  Tolerated well.  This is loose there is slight underlying necrotic tissue to the distal toe.  He will continue with medical compression stockings bilaterally direct skin contact  Follow-Up Instructions: Return in about 2 weeks (around 04/17/2022).   Ortho  Exam  Patient is alert, oriented, no adenopathy, well-dressed, normal affect, normal respiratory effort. On examination of the right foot the wound bed is filled in with flat pink tissue there is epiboly of the edges.  This is unchanged in size no active drainage no probing no exposed bone or tendon no surrounding erythema or warmth  On examination of the left lower extremity he does have significant edema this is nonpitting to the great toe this has been stable over many months.  He does have maceration.  The nail plate is impending else avulsion.  The nail was trimmed today after informed consent there is no underlying probing or undermining  Imaging: No results found.     Labs: Lab Results  Component Value Date   HGBA1C 10.9 (H) 04/14/2021   HGBA1C 10.5 (H) 02/07/2021   HGBA1C 10.6 (H) 02/07/2021   ESRSEDRATE 108 (H) 04/08/2021   ESRSEDRATE 28 (H) 01/14/2016   CRP 27.2 (H) 04/08/2021   LABURIC 7.0 01/14/2016   REPTSTATUS 04/23/2021 FINAL 04/18/2021   GRAMSTAIN NO WBC SEEN NO ORGANISMS SEEN  04/18/2021   CULT  04/18/2021    RARE PROTEUS MIRABILIS WITHIN MIXED ORGANISMS NO ANAEROBES ISOLATED Performed at [facility], [place] [address], [place], [place] [address]  LABORGA PROTEUS MIRABILIS 04/18/2021     Lab Results  Component Value Date   ALBUMIN 2.9 (L) 04/18/2021   ALBUMIN 2.9 (L) 04/14/2021   ALBUMIN 3.0 (L) 04/08/2021    Lab Results  Component Value Date   MG 1.8 04/18/2021   MG 1.9 04/14/2021   MG 1.6 (L) 02/09/2021   No results found for: VD25OH  No results found for: PREALBUMIN    Latest Ref Rng & Units 04/25/2021    6:00 PM 04/24/2021   12:02 PM 04/23/2021    2:17 AM  CBC EXTENDED  WBC 4.0 - 10.5 K/uL 13.5   11.1     RBC 4.22 - 5.81 MIL/uL 3.30   3.43     Hemoglobin 13.0 - 17.0 g/dL 10.5   10.7   10.0    HCT 39.0 - 52.0 % 32.0   33.3   30.4    Platelets 150 - 400 K/uL 274   294     NEUT# 1.7 - 7.7 K/uL 9.6   7.6     Lymph# 0.7 - 4.0 K/uL  2.0   2.1        There is no height or weight on file to calculate BMI.  Orders:  No orders of the defined types were placed in this encounter.  No orders of the defined types were placed in this encounter.    Procedures: No procedures performed  Clinical Data: No additional findings.  ROS:  All other systems negative, except as noted in the HPI. Review of Systems  Objective: Vital Signs: There were no vitals taken for this visit.  Specialty Comments:  No specialty comments available.  PMFS History: Patient Active Problem List   Diagnosis Date Noted   Cellulitis of right foot 04/17/2021   Osteomyelitis ([place]) 04/14/2021   Right foot infection    Diabetic foot infection ([place]) 04/08/2021   HTN (hypertension) 04/08/2021   HLD (hyperlipidemia) 04/08/2021   Alcohol abuse    Cellulitis    Bilateral leg edema 02/07/2021   Anasarca    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 2 diabetes mellitus, limited to breakdown of skin ([place])    Hypokalemia 01/28/2014   DM (diabetes mellitus), type 2, uncontrolled 01/27/2014   AKI (acute kidney injury) ([place]) 01/27/2014   Leukocytosis 01/27/2014   Facial cellulitis 01/26/2014   Dental abscess 01/26/2014   Infected dental carries 01/26/2014   Cellulitis and abscess 01/26/2014   Past Medical History:  Diagnosis Date   Alcohol abuse    Diabetes mellitus type 2 in obese ([place])    HTN (hypertension)     History reviewed. No pertinent family history.  Past Surgical History:  Procedure Laterality Date   I & D EXTREMITY Right 04/18/2021   Procedure: IRRIGATION AND DEBRIDEMENT OF FOOT;  Surgeon: Newt Minion, MD;  Location: [facility];  Service: Orthopedics;  Laterality: Right;   I & D EXTREMITY Right 04/20/2021   Procedure: EXCISIONAL DEBRIDEMENT RIGHT FOOT, APPLICATION OF SKIN GRAFT;  Surgeon: Newt Minion, MD;  Location: [place] [facility];  Service: Orthopedics;  Laterality: Right;   Social History   Occupational History    Occupation: Dealer  Tobacco Use   Smoking status: Never   Smokeless tobacco: Never  Vaping Use   Vaping Use: Never used  Substance and Sexual Activity   Alcohol use: Yes    Alcohol/week: 2.0 standard drinks    Types: 2 Cans of beer per week    Comment: Occasional  Drug use: No   Sexual activity: Not Currently    Partners: Female

## 2022-04-04 ENCOUNTER — Encounter (HOSPITAL_COMMUNITY): Payer: Self-pay

## 2022-04-04 ENCOUNTER — Encounter: Payer: Self-pay | Admitting: Vascular Surgery

## 2022-04-17 ENCOUNTER — Ambulatory Visit: Payer: Self-pay | Admitting: Family

## 2022-04-30 ENCOUNTER — Encounter: Payer: Self-pay | Admitting: Family

## 2022-04-30 ENCOUNTER — Ambulatory Visit (INDEPENDENT_AMBULATORY_CARE_PROVIDER_SITE_OTHER): Payer: Self-pay | Admitting: Family

## 2022-04-30 DIAGNOSIS — R600 Localized edema: Secondary | ICD-10-CM

## 2022-04-30 DIAGNOSIS — Z945 Skin transplant status: Secondary | ICD-10-CM

## 2022-04-30 DIAGNOSIS — M86272 Subacute osteomyelitis, left ankle and foot: Secondary | ICD-10-CM

## 2022-05-13 NOTE — Progress Notes (Signed)
Office Visit Note   Patient: Jesse [name]           Date of Birth: [birth year]           MRN: [id number] Visit Date: 04/30/2022              Requested by: No referring provider defined for this encounter. PCP: Pcp, No  Chief Complaint  Patient presents with   Right Foot - Follow-up    04/20/21 excisional deb w/ kerecis graft 06/07/21 in office epicord placement   Left Foot - Wound Check    Ulcer of GT      HPI: The patient is a 61 year old gentleman who presents today in routine follow-up for 2 separate issues he is seen for evaluation of right foot he did have excisional debridement with Kerecis graft placement on June 10 of last year this was followed by epic replacement in the office on July 28 of last year this has yet to completely heal.  He also has a left great toe ulcer which unfortunately has significantly broken down today he has been wearing cotton socks not doing any wound care is full weightbearing  He does relate that he is quite pleased with the reduction in edema to bilateral lower extremities.  He has been unable to make it to a vascular surgery appointment. we have set this up for him twice now.  Assessment & Plan: Visit Diagnoses:  1. Subacute osteomyelitis, left ankle and foot (HCC)   2. History of skin graft   3. Bilateral leg edema     Plan: Dr. Lajoyce Corners to the bedside.  Discussed recommendation of amputation of the great toe concerning for spread of infection.  At this time patient would like to continue with wound care he is now interested in surgical options.  Discussed keeping the great toe clean and dry using Silvadene dressings.    Follow-Up Instructions: No follow-ups on file.   Ortho Exam  Patient is alert, oriented, no adenopathy, poor dentition, disheveled, normal affect, normal respiratory effort. The right foot plantar ulcer is stable this continues to be about 2 cm in diameter 3 mm deep filled in with fibrinous tissue there is no surrounding  erythema no drainage no odor no concerning sign.  The left great toe nail plate is loose there is degloving.  There is necrotic tissue over the distal aspect sausage digit swelling.  There is a foul odor.  There is no active drainage no erythema he does have a dorsalis pedis pulse which is palpable.  Imaging: No results found.   Labs: Lab Results  Component Value Date   HGBA1C 10.9 (H) 04/14/2021   HGBA1C 10.5 (H) 02/07/2021   HGBA1C 10.6 (H) 02/07/2021   ESRSEDRATE 108 (H) 04/08/2021   ESRSEDRATE 28 (H) 01/14/2016   CRP 27.2 (H) 04/08/2021   LABURIC 7.0 01/14/2016   REPTSTATUS 04/23/2021 FINAL 04/18/2021   GRAMSTAIN NO WBC SEEN NO ORGANISMS SEEN  04/18/2021   CULT  04/18/2021    RARE PROTEUS MIRABILIS WITHIN MIXED ORGANISMS NO ANAEROBES ISOLATED Performed at [facility], [address], [place], [place] [address]    Imelda Pillow PROTEUS MIRABILIS 04/18/2021     Lab Results  Component Value Date   ALBUMIN 2.9 (L) 04/18/2021   ALBUMIN 2.9 (L) 04/14/2021   ALBUMIN 3.0 (L) 04/08/2021    Lab Results  Component Value Date   MG 1.8 04/18/2021   MG 1.9 04/14/2021   MG 1.6 (L) 02/09/2021  No results found for: "VD25OH"  No results found for: "PREALBUMIN"    Latest Ref Rng & Units 04/25/2021    6:00 PM 04/24/2021   12:02 PM 04/23/2021    2:17 AM  CBC EXTENDED  WBC 4.0 - 10.5 K/uL 13.5  11.1    RBC 4.22 - 5.81 MIL/uL 3.30  3.43    Hemoglobin 13.0 - 17.0 g/dL 62.8  36.6  29.4   HCT 39.0 - 52.0 % 32.0  33.3  30.4   Platelets 150 - 400 K/uL 274  294    NEUT# 1.7 - 7.7 K/uL 9.6  7.6    Lymph# 0.7 - 4.0 K/uL 2.0  2.1       There is no height or weight on file to calculate BMI.  Orders:  No orders of the defined types were placed in this encounter.  No orders of the defined types were placed in this encounter.    Procedures: No procedures performed  Clinical Data: No additional findings.  ROS:  All other systems negative, except as noted in the  HPI. Review of Systems  Objective: Vital Signs: There were no vitals taken for this visit.  Specialty Comments:  No specialty comments available.  PMFS History: Patient Active Problem List   Diagnosis Date Noted   Cellulitis of right foot 04/17/2021   Osteomyelitis (HCC) 04/14/2021   Right foot infection    Diabetic foot infection (HCC) 04/08/2021   HTN (hypertension) 04/08/2021   HLD (hyperlipidemia) 04/08/2021   Alcohol abuse    Cellulitis    Bilateral leg edema 02/07/2021   Anasarca    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 2 diabetes mellitus, limited to breakdown of skin (HCC)    Hypokalemia 01/28/2014   DM (diabetes mellitus), type 2, uncontrolled 01/27/2014   AKI (acute kidney injury) (HCC) 01/27/2014   Leukocytosis 01/27/2014   Facial cellulitis 01/26/2014   Dental abscess 01/26/2014   Infected dental carries 01/26/2014   Cellulitis and abscess 01/26/2014   Past Medical History:  Diagnosis Date   Alcohol abuse    Diabetes mellitus type 2 in obese (HCC)    HTN (hypertension)     History reviewed. No pertinent family history.  Past Surgical History:  Procedure Laterality Date   I & D EXTREMITY Right 04/18/2021   Procedure: IRRIGATION AND DEBRIDEMENT OF FOOT;  Surgeon: Nadara Mustard, MD;  Location: [facility];  Service: Orthopedics;  Laterality: Right;   I & D EXTREMITY Right 04/20/2021   Procedure: EXCISIONAL DEBRIDEMENT RIGHT FOOT, APPLICATION OF SKIN GRAFT;  Surgeon: Nadara Mustard, MD;  Location: [facility] OR;  Service: Orthopedics;  Laterality: Right;   Social History   Occupational History   Occupation: Curator  Tobacco Use   Smoking status: Never   Smokeless tobacco: Never  Vaping Use   Vaping Use: Never used  Substance and Sexual Activity   Alcohol use: Yes    Alcohol/week: 2.0 standard drinks of alcohol    Types: 2 Cans of beer per week    Comment: Occasional   Drug use: No   Sexual activity: Not Currently    Partners: Female

## 2022-05-31 ENCOUNTER — Encounter (HOSPITAL_COMMUNITY): Payer: Self-pay

## 2022-06-04 ENCOUNTER — Encounter: Payer: Self-pay | Admitting: Vascular Surgery

## 2022-06-10 ENCOUNTER — Encounter (HOSPITAL_COMMUNITY): Payer: Self-pay

## 2022-06-11 ENCOUNTER — Encounter: Payer: Self-pay | Admitting: Vascular Surgery

## 2022-06-20 ENCOUNTER — Emergency Department (HOSPITAL_COMMUNITY): Payer: Medicaid Other

## 2022-06-20 ENCOUNTER — Emergency Department (HOSPITAL_COMMUNITY)
Admission: EM | Admit: 2022-06-20 | Discharge: 2022-06-20 | Discharge status: Against Medical Advice | Payer: Medicaid Other | Attending: Emergency Medicine | Admitting: Emergency Medicine

## 2022-06-20 DIAGNOSIS — R4182 Altered mental status, unspecified: Secondary | ICD-10-CM | POA: Diagnosis not present

## 2022-06-20 DIAGNOSIS — M869 Osteomyelitis, unspecified: Secondary | ICD-10-CM | POA: Insufficient documentation

## 2022-06-20 DIAGNOSIS — R0902 Hypoxemia: Secondary | ICD-10-CM | POA: Diagnosis not present

## 2022-06-20 DIAGNOSIS — E119 Type 2 diabetes mellitus without complications: Secondary | ICD-10-CM | POA: Diagnosis not present

## 2022-06-20 DIAGNOSIS — I10 Essential (primary) hypertension: Secondary | ICD-10-CM | POA: Diagnosis not present

## 2022-06-20 DIAGNOSIS — R410 Disorientation, unspecified: Secondary | ICD-10-CM | POA: Diagnosis not present

## 2022-06-20 DIAGNOSIS — E1165 Type 2 diabetes mellitus with hyperglycemia: Secondary | ICD-10-CM

## 2022-06-20 DIAGNOSIS — E876 Hypokalemia: Secondary | ICD-10-CM | POA: Diagnosis not present

## 2022-06-20 DIAGNOSIS — Z79899 Other long term (current) drug therapy: Secondary | ICD-10-CM | POA: Insufficient documentation

## 2022-06-20 DIAGNOSIS — I959 Hypotension, unspecified: Secondary | ICD-10-CM | POA: Diagnosis not present

## 2022-06-20 DIAGNOSIS — N179 Acute kidney failure, unspecified: Secondary | ICD-10-CM | POA: Diagnosis not present

## 2022-06-20 DIAGNOSIS — L03116 Cellulitis of left lower limb: Secondary | ICD-10-CM | POA: Diagnosis not present

## 2022-06-20 DIAGNOSIS — Z7984 Long term (current) use of oral hypoglycemic drugs: Secondary | ICD-10-CM | POA: Diagnosis not present

## 2022-06-20 DIAGNOSIS — R112 Nausea with vomiting, unspecified: Secondary | ICD-10-CM | POA: Insufficient documentation

## 2022-06-20 DIAGNOSIS — Z794 Long term (current) use of insulin: Secondary | ICD-10-CM | POA: Diagnosis not present

## 2022-06-20 DIAGNOSIS — M86172 Other acute osteomyelitis, left ankle and foot: Secondary | ICD-10-CM | POA: Diagnosis not present

## 2022-06-20 DIAGNOSIS — R0689 Other abnormalities of breathing: Secondary | ICD-10-CM | POA: Diagnosis not present

## 2022-06-20 LAB — CBC WITH DIFFERENTIAL/PLATELET
Abs Immature Granulocytes: 2.99 10*3/uL — ABNORMAL HIGH (ref 0.00–0.07)
Basophils Absolute: 0.1 10*3/uL (ref 0.0–0.1)
Basophils Relative: 1 %
Eosinophils Absolute: 0 10*3/uL (ref 0.0–0.5)
Eosinophils Relative: 0 %
HCT: 38.2 % — ABNORMAL LOW (ref 39.0–52.0)
Hemoglobin: 13.4 g/dL (ref 13.0–17.0)
Immature Granulocytes: 11 %
Lymphocytes Relative: 4 %
Lymphs Abs: 1 10*3/uL (ref 0.7–4.0)
MCH: 34.7 pg — ABNORMAL HIGH (ref 26.0–34.0)
MCHC: 35.1 g/dL (ref 30.0–36.0)
MCV: 99 fL (ref 80.0–100.0)
Monocytes Absolute: 2.2 10*3/uL — ABNORMAL HIGH (ref 0.1–1.0)
Monocytes Relative: 8 %
Neutro Abs: 22 10*3/uL — ABNORMAL HIGH (ref 1.7–7.7)
Neutrophils Relative %: 76 %
Platelets: 163 10*3/uL (ref 150–400)
RBC: 3.86 MIL/uL — ABNORMAL LOW (ref 4.22–5.81)
RDW: 13.2 % (ref 11.5–15.5)
WBC Morphology: INCREASED
WBC: 28.3 10*3/uL — ABNORMAL HIGH (ref 4.0–10.5)
nRBC: 0.2 % (ref 0.0–0.2)

## 2022-06-20 LAB — COMPREHENSIVE METABOLIC PANEL
ALT: 46 U/L — ABNORMAL HIGH (ref 0–44)
AST: 89 U/L — ABNORMAL HIGH (ref 15–41)
Albumin: 2.5 g/dL — ABNORMAL LOW (ref 3.5–5.0)
Alkaline Phosphatase: 94 U/L (ref 38–126)
Anion gap: 16 — ABNORMAL HIGH (ref 5–15)
BUN: 58 mg/dL — ABNORMAL HIGH (ref 6–20)
CO2: 15 mmol/L — ABNORMAL LOW (ref 22–32)
Calcium: 8.4 mg/dL — ABNORMAL LOW (ref 8.9–10.3)
Chloride: 101 mmol/L (ref 98–111)
Creatinine, Ser: 4.78 mg/dL — ABNORMAL HIGH (ref 0.61–1.24)
GFR, Estimated: 13 mL/min — ABNORMAL LOW (ref >60)
Glucose, Bld: 221 mg/dL — ABNORMAL HIGH (ref 70–99)
Potassium: 3.6 mmol/L (ref 3.5–5.1)
Sodium: 132 mmol/L — ABNORMAL LOW (ref 135–145)
Total Bilirubin: 1.3 mg/dL — ABNORMAL HIGH (ref 0.3–1.2)
Total Protein: 7.4 g/dL (ref 6.5–8.1)

## 2022-06-20 LAB — LACTIC ACID, PLASMA
Lactic Acid, Venous: 3.1 mmol/L (ref 0.5–1.9)
Lactic Acid, Venous: 2.2 mmol/L (ref 0.5–1.9)

## 2022-06-20 LAB — HEMOGLOBIN A1C
Hgb A1c MFr Bld: 8.3 % — ABNORMAL HIGH (ref 4.8–5.6)
Mean Plasma Glucose: 191.51 mg/dL

## 2022-06-20 MED ORDER — ACETAMINOPHEN 325 MG PO TABS
650.0000 mg | ORAL_TABLET | Freq: Once | ORAL | Status: AC
  Administered 2022-06-20: 650 mg via ORAL
  Filled 2022-06-20 (×2): qty 2

## 2022-06-20 MED ORDER — ONDANSETRON HCL 4 MG/2ML IJ SOLN
4.0000 mg | Freq: Once | INTRAMUSCULAR | Status: DC
  Filled 2022-06-20: qty 2

## 2022-06-20 MED ORDER — SODIUM CHLORIDE 0.9 % IV BOLUS
1000.0000 mL | Freq: Once | INTRAVENOUS | Status: AC
  Administered 2022-06-20: 1000 mL via INTRAVENOUS

## 2022-06-20 MED ORDER — VANCOMYCIN HCL IN DEXTROSE 1-5 GM/200ML-% IV SOLN
1000.0000 mg | Freq: Once | INTRAVENOUS | Status: AC
Start: 2022-06-20 — End: 2022-06-20
  Administered 2022-06-20: 1000 mg via INTRAVENOUS
  Filled 2022-06-20: qty 200

## 2022-06-20 MED ORDER — SODIUM CHLORIDE 0.9 % IV SOLN
2.0000 g | Freq: Once | INTRAVENOUS | Status: AC
  Administered 2022-06-20: 2 g via INTRAVENOUS
  Filled 2022-06-20: qty 12.5

## 2022-06-20 NOTE — ED Triage Notes (Signed)
Pt BIB EMS from home for weakness and altered mental status. A&Ox4 with slow response, states he ate "something funky" a few days ago. N/V/diarhea for the past 4 days Infection in the lower right leg with possible necrosis    4mg  zofran, 500 NS, 200 LR PTA   100/60 38 RR 95% RA CBG 241 22 EtCO2 114 ST

## 2022-06-20 NOTE — Discharge Instructions (Addendum)
Presented to the ED with nausea and vomiting and was found to have significant infection and severe kidney injury.  We gave you antibiotics and IV fluids in the ED and recommended admission.  You stated you did not want to get admitted.  We consulted you on the risk of not getting admitted including progression of the infection to lead to life-threatening situation.  You stated you did not want to get admitted and you to seek care if needed.  You stated your neighbor called EMS against your wish.  We will give you oral antibiotics, please take these medications.  We want you to seek medical care to soon as you can as you will need IV antibiotics and amputation.  Please make appointment with the PCP and make an appointment Dr. Audrie Lia office as possible.  Both of these numbers are provided.

## 2022-06-20 NOTE — ED Provider Notes (Signed)
[facility] EMERGENCY DEPARTMENT Provider Note   CSN: [id number] Arrival date & time: 06/20/22  1502     History  Chief Complaint  Patient presents with   Emesis   Wound Infection    Jesse [name] is a 61 y.o. male with history of hypertension, hyper lipidemia, diabetes mellitus type 2, and history of chronic wounds presented to ED with complaint of nausea and vomiting.  Patient states that has been going on for 2 weeks.  This is contradictory to the EMS report of 4 days.  Patient does have some and some altered mentation but he is alert and oriented x 4.  He states at home he has been using boric acid to kill bugs in the house believes he has been exposed to those causing him the nausea and vomiting.  He states he does not have any other concerns right now.   Home Medications Prior to Admission medications   Medication Sig Start Date End Date Taking? Authorizing Provider  Accu-Chek Softclix Lancets lancets Use as directed. 04/20/21   Patrecia Pour, MD  amoxicillin-clavulanate (AUGMENTIN) 875-125 MG tablet Take 1 tablet by mouth every 12 (twelve) hours. 12/09/21   Caccavale, Sophia, PA-C  atorvastatin (LIPITOR) 40 MG tablet Take 1 tablet (40 mg total) by mouth daily. 04/20/21   Patrecia Pour, MD  Blood Glucose Monitoring Suppl (ACCU-CHEK GUIDE) w/Device KIT Use as directed 04/20/21   Patrecia Pour, MD  doxycycline (VIBRA-TABS) 100 MG tablet Take 1 tablet (100 mg total) by mouth 2 (two) times daily. 03/26/22   Suzan Slick, NP  glucose blood (ACCU-CHEK GUIDE) test strip Use as instructed 04/20/21   Patrecia Pour, MD  insulin glargine (LANTUS) 100 UNIT/ML Solostar Pen INJECT 10 UNITS INTO THE SKIN DAILY. 04/20/21   Patrecia Pour, MD  lidocaine (XYLOCAINE) 2 % solution Use as directed 15 mLs in the mouth or throat as needed for mouth pain. 12/09/21   Caccavale, Sophia, PA-C  losartan (COZAAR) 25 MG tablet TAKE 1 TABLET (25 MG TOTAL) BY MOUTH DAILY. 04/20/21 04/20/22  Patrecia Pour, MD  oxyCODONE-acetaminophen (PERCOCET/ROXICET) 5-325 MG tablet Take 1 tablet by mouth every 4 (four) hours as needed for severe pain. 04/30/21   Newt Minion, MD  silver sulfADIAZINE (SILVADENE) 1 % cream Apply 1 application. topically daily. 03/26/22   Suzan Slick, NP  glipiZIDE (GLUCOTROL) 5 MG tablet Take 1 tablet (5 mg total) by mouth daily before breakfast. Patient not taking: Reported on 01/14/2016 01/31/14 01/14/16  Delfina Redwood, MD      Allergies    Patient has no known allergies.    Review of Systems   Review of Systems  Constitutional:  Positive for appetite change, chills and fatigue.  HENT:  Negative for sinus pressure and sinus pain.   Eyes:  Negative for pain and redness.  Respiratory:  Negative for choking, chest tightness and shortness of breath.   Cardiovascular:  Positive for leg swelling. Negative for chest pain and palpitations.  Gastrointestinal:  Positive for vomiting. Negative for abdominal distention and abdominal pain.  Endocrine: Negative for cold intolerance and heat intolerance.  Genitourinary:  Negative for difficulty urinating and dysuria.  Musculoskeletal:  Negative for back pain and neck pain.  Skin:  Positive for wound. Negative for rash.  Neurological:  Negative for dizziness and headaches.  Psychiatric/Behavioral:  Negative for agitation and behavioral problems.     Physical Exam Updated Vital Signs BP 107/64  Pulse 94   Temp 100.1 F (37.8 C) (Oral)   Resp (!) 22   SpO2 96%  Physical Exam: Gen: unkempt male laying in bed in NAD NCAT: right eye with crusted discharge. NCAT. Dry MMM CV: NSR, 2+ radial pulses, nonpalpable pulses (present with doppler) Resp: CTAB Abd: normal bowel sounds, soft, No TTP MSK/Skin: multiple abnormalities in lower extremities. Both lower extremities with skin changes as pictured. Right foot with plantar ulcer, and left foot with great toe wound.  Neuro: alert and oriented x4. Slight slow to respond but  responding consistently and with appropriate answers.  Psych: flat affect  ED Results / Procedures / Treatments   Labs (all labs ordered are listed, but only abnormal results are displayed) Labs Reviewed  CBC WITH DIFFERENTIAL/PLATELET - Abnormal; Notable for the following components:      Result Value   WBC 28.3 (*)    RBC 3.86 (*)    HCT 38.2 (*)    MCH 34.7 (*)    Neutro Abs 22.0 (*)    Monocytes Absolute 2.2 (*)    Abs Immature Granulocytes 2.99 (*)    All other components within normal limits  COMPREHENSIVE METABOLIC PANEL - Abnormal; Notable for the following components:   Sodium 132 (*)    CO2 15 (*)    Glucose, Bld 221 (*)    BUN 58 (*)    Creatinine, Ser 4.78 (*)    Calcium 8.4 (*)    Albumin 2.5 (*)    AST 89 (*)    ALT 46 (*)    Total Bilirubin 1.3 (*)    GFR, Estimated 13 (*)    Anion gap 16 (*)    All other components within normal limits  LACTIC ACID, PLASMA - Abnormal; Notable for the following components:   Lactic Acid, Venous 3.1 (*)    All other components within normal limits  CULTURE, BLOOD (ROUTINE X 2)  CULTURE, BLOOD (ROUTINE X 2)  URINE CULTURE  LACTIC ACID, PLASMA  URINALYSIS, ROUTINE W REFLEX MICROSCOPIC  PATHOLOGIST SMEAR REVIEW    EKG EKG Interpretation  Date/Time:  Thursday June 20 2022 15:08:47 EDT Ventricular Rate:  98 PR Interval:  140 QRS Duration: 88 QT Interval:  374 QTC Calculation: 478 R Axis:   55 Text Interpretation: Sinus rhythm Borderline prolonged QT interval  QT more prolonged since previous Confirmed by Wandra Arthurs [phone]) on 06/20/2022 3:33:10 PM  Radiology DG Tibia/Fibula Left  Result Date: 06/20/2022 CLINICAL DATA:  Right lower extremity cellulitis. EXAM: LEFT TIBIA AND FIBULA - 2 VIEW COMPARISON:  None Available. FINDINGS: There is no evidence of fracture or other focal bone lesions. No lytic destruction is noted. Soft tissues are unremarkable. IMPRESSION: Negative. Electronically Signed   By: Marijo Conception  M.D.   On: 06/20/2022 16:53   DG Tibia/Fibula Right  Result Date: 06/20/2022 CLINICAL DATA:  Right lower extremity cellulitis. EXAM: RIGHT TIBIA AND FIBULA - 2 VIEW COMPARISON:  None Available. FINDINGS: There is no evidence of fracture or other focal bone lesions. No lytic destruction is seen. Soft tissues are unremarkable. IMPRESSION: Negative. Electronically Signed   By: Marijo Conception M.D.   On: 06/20/2022 16:52   DG Foot Complete Right  Result Date: 06/20/2022 CLINICAL DATA:  Cellulitis. EXAM: RIGHT FOOT COMPLETE - 3+ VIEW COMPARISON:  None Available. FINDINGS: There is no evidence of fracture or dislocation. There is no evidence of arthropathy or other focal bone abnormality. Dorsal soft tissue swelling is noted. Large  ulceration is seen involving the plantar soft tissues. Metallic staple is seen in this area concerning for foreign body. IMPRESSION: Dorsal soft tissue swelling is noted concerning for cellulitis. Large ulceration is seen involving the plantar soft tissues with adjacent metallic staple in this area concerning for foreign body. No definite lytic destruction is seen to suggest osteomyelitis. Electronically Signed   By: Marijo Conception M.D.   On: 06/20/2022 16:51   DG Foot Complete Left  Result Date: 06/20/2022 CLINICAL DATA:  Possible osteomyelitis.  Cellulitis. EXAM: LEFT FOOT - COMPLETE 3+ VIEW COMPARISON:  None Available. FINDINGS: Large ulceration is seen overlying the first distal phalanx. There appears to be lytic destruction involving the distal tuft of the first distal phalanx consistent with osteomyelitis. No fracture or dislocation is noted. IMPRESSION: Large ulceration involving the first toe with underlying lytic destruction of the distal tuft of first distal phalanx consistent with osteomyelitis. Electronically Signed   By: Marijo Conception M.D.   On: 06/20/2022 16:49    Procedures NA   Medications Ordered in ED Medications  sodium chloride 0.9 % bolus 1,000 mL  (has no administration in time range)  ondansetron (ZOFRAN) injection 4 mg (has no administration in time range)  vancomycin (VANCOCIN) IVPB 1000 mg/200 mL premix (has no administration in time range)  acetaminophen (TYLENOL) tablet 650 mg (has no administration in time range)  ceFEPIme (MAXIPIME) 2 g in sodium chloride 0.9 % 100 mL IVPB (has no administration in time range)    ED Course/ Medical Decision Making/ A&P                           Medical Decision Making Patient presented with nausea and vomiting and found to have sepsis secondary to osteomyelitis. IV fluids and IV antibiotics started. Patient met criteria for admission given diagnostic findings with WBC of 28k, Creatinine of 4.8, LA of 3.1, imaging of the foot showing osteomyelitis. Blood and urine cultures were pending. Patient given the data and the plan but refused admission and stated he has to leave tonight and will come back to the ED once he finishes working at home. He was advised that leaving the hospital places him at an increased risk for worsening of the disease and potentially death but patient stated he does not agree to admission. Social services contacted to help patient establish with PCP and orthopedics and ensure patient has transportation. Patient signed AMA paperwork and left.  Amount and/or Complexity of Data Reviewed External Data Reviewed: labs, radiology, ECG and notes.    Details: Previous data reviewed including diagnostic data and notes. Labs: ordered. Decision-making details documented in ED Course. Radiology: ordered and independent interpretation performed. Decision-making details documented in ED Course. ECG/medicine tests: ordered and independent interpretation performed. Decision-making details documented in ED Course.  Risk OTC drugs. Prescription drug management.           Final Clinical Impression(s) / ED Diagnoses Final diagnoses:  None    Rx / DC Orders ED Discharge Orders      None      Idamae Schuller, MD Tillie Rung. [facility] Internal Medicine Residency, PGY-2    Idamae Schuller, MD 06/24/22 1142    Drenda Freeze, MD 06/25/22 [phone]

## 2022-06-20 NOTE — Progress Notes (Signed)
Pharmacy Antibiotic Note  Jesse [name] is a 61 y.o. male for which pharmacy has been consulted for cefepime dosing for  wound infection .  Patient received 2g cefepime in the ED and left AMA  [name], PharmD, BCPS 06/20/2022 4:05 PM ED Clinical Pharmacist -  [phone]

## 2022-06-21 LAB — PATHOLOGIST SMEAR REVIEW

## 2022-06-22 ENCOUNTER — Inpatient Hospital Stay (HOSPITAL_COMMUNITY)
Admission: EM | Admit: 2022-06-22 | Discharge: 2022-07-03 | DRG: 871 | Disposition: A | Discharge status: Home / Self Care | Payer: 59 | Attending: Internal Medicine | Admitting: Internal Medicine

## 2022-06-22 ENCOUNTER — Emergency Department (HOSPITAL_COMMUNITY): Payer: 59

## 2022-06-22 ENCOUNTER — Encounter (HOSPITAL_COMMUNITY): Payer: Self-pay | Admitting: Internal Medicine

## 2022-06-22 DIAGNOSIS — G9340 Encephalopathy, unspecified: Secondary | ICD-10-CM

## 2022-06-22 DIAGNOSIS — I10 Essential (primary) hypertension: Secondary | ICD-10-CM | POA: Diagnosis present

## 2022-06-22 DIAGNOSIS — I129 Hypertensive chronic kidney disease with stage 1 through stage 4 chronic kidney disease, or unspecified chronic kidney disease: Secondary | ICD-10-CM | POA: Diagnosis not present

## 2022-06-22 DIAGNOSIS — N170 Acute kidney failure with tubular necrosis: Secondary | ICD-10-CM | POA: Diagnosis not present

## 2022-06-22 DIAGNOSIS — N179 Acute kidney failure, unspecified: Secondary | ICD-10-CM | POA: Diagnosis not present

## 2022-06-22 DIAGNOSIS — E11621 Type 2 diabetes mellitus with foot ulcer: Secondary | ICD-10-CM | POA: Diagnosis not present

## 2022-06-22 DIAGNOSIS — Z794 Long term (current) use of insulin: Secondary | ICD-10-CM

## 2022-06-22 DIAGNOSIS — L03115 Cellulitis of right lower limb: Secondary | ICD-10-CM | POA: Diagnosis not present

## 2022-06-22 DIAGNOSIS — D72829 Elevated white blood cell count, unspecified: Secondary | ICD-10-CM | POA: Diagnosis not present

## 2022-06-22 DIAGNOSIS — M86672 Other chronic osteomyelitis, left ankle and foot: Secondary | ICD-10-CM | POA: Diagnosis not present

## 2022-06-22 DIAGNOSIS — L03119 Cellulitis of unspecified part of limb: Secondary | ICD-10-CM

## 2022-06-22 DIAGNOSIS — G9341 Metabolic encephalopathy: Secondary | ICD-10-CM | POA: Diagnosis present

## 2022-06-22 DIAGNOSIS — E1165 Type 2 diabetes mellitus with hyperglycemia: Secondary | ICD-10-CM | POA: Diagnosis not present

## 2022-06-22 DIAGNOSIS — E1169 Type 2 diabetes mellitus with other specified complication: Secondary | ICD-10-CM | POA: Diagnosis present

## 2022-06-22 DIAGNOSIS — R6520 Severe sepsis without septic shock: Secondary | ICD-10-CM | POA: Diagnosis present

## 2022-06-22 DIAGNOSIS — E785 Hyperlipidemia, unspecified: Secondary | ICD-10-CM | POA: Diagnosis present

## 2022-06-22 DIAGNOSIS — E43 Unspecified severe protein-calorie malnutrition: Secondary | ICD-10-CM

## 2022-06-22 DIAGNOSIS — A419 Sepsis, unspecified organism: Principal | ICD-10-CM

## 2022-06-22 DIAGNOSIS — E860 Dehydration: Secondary | ICD-10-CM | POA: Diagnosis present

## 2022-06-22 DIAGNOSIS — E11628 Type 2 diabetes mellitus with other skin complications: Secondary | ICD-10-CM | POA: Diagnosis not present

## 2022-06-22 DIAGNOSIS — I87331 Chronic 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: Secondary | ICD-10-CM | POA: Diagnosis not present

## 2022-06-22 DIAGNOSIS — Z6832 Body mass index (BMI) 32.0-32.9, adult: Secondary | ICD-10-CM

## 2022-06-22 DIAGNOSIS — E1122 Type 2 diabetes mellitus with diabetic chronic kidney disease: Secondary | ICD-10-CM | POA: Diagnosis not present

## 2022-06-22 DIAGNOSIS — I872 Venous insufficiency (chronic) (peripheral): Secondary | ICD-10-CM | POA: Diagnosis present

## 2022-06-22 DIAGNOSIS — M86172 Other acute osteomyelitis, left ankle and foot: Secondary | ICD-10-CM

## 2022-06-22 DIAGNOSIS — E1140 Type 2 diabetes mellitus with diabetic neuropathy, unspecified: Secondary | ICD-10-CM | POA: Diagnosis present

## 2022-06-22 DIAGNOSIS — Q614 Renal dysplasia: Secondary | ICD-10-CM | POA: Diagnosis not present

## 2022-06-22 DIAGNOSIS — M009 Pyogenic arthritis, unspecified: Secondary | ICD-10-CM | POA: Diagnosis present

## 2022-06-22 DIAGNOSIS — E11610 Type 2 diabetes mellitus with diabetic neuropathic arthropathy: Secondary | ICD-10-CM | POA: Diagnosis present

## 2022-06-22 DIAGNOSIS — E1152 Type 2 diabetes mellitus with diabetic peripheral angiopathy with gangrene: Secondary | ICD-10-CM | POA: Diagnosis present

## 2022-06-22 DIAGNOSIS — R0689 Other abnormalities of breathing: Secondary | ICD-10-CM | POA: Diagnosis not present

## 2022-06-22 DIAGNOSIS — N1831 Chronic kidney disease, stage 3a: Secondary | ICD-10-CM | POA: Diagnosis present

## 2022-06-22 DIAGNOSIS — L97919 Non-pressure chronic ulcer of unspecified part of right lower leg with unspecified severity: Secondary | ICD-10-CM | POA: Diagnosis present

## 2022-06-22 DIAGNOSIS — M86272 Subacute osteomyelitis, left ankle and foot: Secondary | ICD-10-CM

## 2022-06-22 DIAGNOSIS — Z597 Insufficient social insurance and welfare support: Secondary | ICD-10-CM

## 2022-06-22 DIAGNOSIS — Z91148 Patient's other noncompliance with medication regimen for other reason: Secondary | ICD-10-CM

## 2022-06-22 DIAGNOSIS — L089 Local infection of the skin and subcutaneous tissue, unspecified: Secondary | ICD-10-CM | POA: Diagnosis present

## 2022-06-22 DIAGNOSIS — R7989 Other specified abnormal findings of blood chemistry: Secondary | ICD-10-CM | POA: Diagnosis present

## 2022-06-22 DIAGNOSIS — I959 Hypotension, unspecified: Secondary | ICD-10-CM | POA: Diagnosis not present

## 2022-06-22 DIAGNOSIS — M869 Osteomyelitis, unspecified: Secondary | ICD-10-CM

## 2022-06-22 DIAGNOSIS — R410 Disorientation, unspecified: Secondary | ICD-10-CM | POA: Diagnosis not present

## 2022-06-22 DIAGNOSIS — M609 Myositis, unspecified: Secondary | ICD-10-CM | POA: Diagnosis present

## 2022-06-22 DIAGNOSIS — I70229 Atherosclerosis of native arteries of extremities with rest pain, unspecified extremity: Secondary | ICD-10-CM | POA: Diagnosis not present

## 2022-06-22 DIAGNOSIS — F1090 Alcohol use, unspecified, uncomplicated: Secondary | ICD-10-CM | POA: Diagnosis present

## 2022-06-22 DIAGNOSIS — R159 Full incontinence of feces: Secondary | ICD-10-CM | POA: Diagnosis present

## 2022-06-22 DIAGNOSIS — R791 Abnormal coagulation profile: Secondary | ICD-10-CM | POA: Diagnosis present

## 2022-06-22 DIAGNOSIS — M86171 Other acute osteomyelitis, right ankle and foot: Secondary | ICD-10-CM | POA: Diagnosis not present

## 2022-06-22 DIAGNOSIS — M1460 Charcot's joint, unspecified site: Secondary | ICD-10-CM

## 2022-06-22 DIAGNOSIS — Z79899 Other long term (current) drug therapy: Secondary | ICD-10-CM

## 2022-06-22 DIAGNOSIS — L03116 Cellulitis of left lower limb: Secondary | ICD-10-CM | POA: Diagnosis not present

## 2022-06-22 DIAGNOSIS — N189 Chronic kidney disease, unspecified: Secondary | ICD-10-CM | POA: Diagnosis not present

## 2022-06-22 DIAGNOSIS — E119 Type 2 diabetes mellitus without complications: Secondary | ICD-10-CM

## 2022-06-22 DIAGNOSIS — R739 Hyperglycemia, unspecified: Secondary | ICD-10-CM | POA: Diagnosis not present

## 2022-06-22 DIAGNOSIS — Z79890 Hormone replacement therapy (postmenopausal): Secondary | ICD-10-CM

## 2022-06-22 HISTORY — DX: Type 2 diabetes mellitus with foot ulcer: E11.621

## 2022-06-22 HISTORY — DX: Generalized edema: R60.1

## 2022-06-22 HISTORY — DX: Cellulitis, unspecified: L03.90

## 2022-06-22 HISTORY — DX: Local infection of the skin and subcutaneous tissue, unspecified: L08.9

## 2022-06-22 LAB — CBC WITH DIFFERENTIAL/PLATELET
Abs Immature Granulocytes: 0 10*3/uL (ref 0.00–0.07)
Basophils Absolute: 0 10*3/uL (ref 0.0–0.1)
Basophils Relative: 0 %
Eosinophils Absolute: 0 10*3/uL (ref 0.0–0.5)
Eosinophils Relative: 0 %
HCT: 34.4 % — ABNORMAL LOW (ref 39.0–52.0)
Hemoglobin: 12.1 g/dL — ABNORMAL LOW (ref 13.0–17.0)
Lymphocytes Relative: 1 %
Lymphs Abs: 0.4 10*3/uL — ABNORMAL LOW (ref 0.7–4.0)
MCH: 34.5 pg — ABNORMAL HIGH (ref 26.0–34.0)
MCHC: 35.2 g/dL (ref 30.0–36.0)
MCV: 98 fL (ref 80.0–100.0)
Monocytes Absolute: 2 10*3/uL — ABNORMAL HIGH (ref 0.1–1.0)
Monocytes Relative: 5 %
Neutro Abs: 38.2 10*3/uL — ABNORMAL HIGH (ref 1.7–7.7)
Neutrophils Relative %: 94 %
Platelets: 205 10*3/uL (ref 150–400)
RBC: 3.51 MIL/uL — ABNORMAL LOW (ref 4.22–5.81)
RDW: 13.8 % (ref 11.5–15.5)
WBC: 40.6 10*3/uL — ABNORMAL HIGH (ref 4.0–10.5)
nRBC: 0 /100 WBC
nRBC: 0.1 % (ref 0.0–0.2)

## 2022-06-22 LAB — BLOOD CULTURE ID PANEL (REFLEXED) - BCID2

## 2022-06-22 LAB — COMPREHENSIVE METABOLIC PANEL
ALT: 75 U/L — ABNORMAL HIGH (ref 0–44)
AST: 110 U/L — ABNORMAL HIGH (ref 15–41)
Albumin: 2 g/dL — ABNORMAL LOW (ref 3.5–5.0)
Alkaline Phosphatase: 157 U/L — ABNORMAL HIGH (ref 38–126)
Anion gap: 13 (ref 5–15)
BUN: 84 mg/dL — ABNORMAL HIGH (ref 6–20)
CO2: 13 mmol/L — ABNORMAL LOW (ref 22–32)
Calcium: 8.3 mg/dL — ABNORMAL LOW (ref 8.9–10.3)
Chloride: 107 mmol/L (ref 98–111)
Creatinine, Ser: 4.19 mg/dL — ABNORMAL HIGH (ref 0.61–1.24)
GFR, Estimated: 16 mL/min — ABNORMAL LOW (ref >60)
Glucose, Bld: 267 mg/dL — ABNORMAL HIGH (ref 70–99)
Potassium: 3.6 mmol/L (ref 3.5–5.1)
Sodium: 133 mmol/L — ABNORMAL LOW (ref 135–145)
Total Bilirubin: 1.5 mg/dL — ABNORMAL HIGH (ref 0.3–1.2)
Total Protein: 6.9 g/dL (ref 6.5–8.1)

## 2022-06-22 LAB — LACTIC ACID, PLASMA: Lactic Acid, Venous: 2 mmol/L (ref 0.5–1.9)

## 2022-06-22 LAB — PROTIME-INR
INR: 1.7 — ABNORMAL HIGH (ref 0.8–1.2)
Prothrombin Time: 19.9 seconds — ABNORMAL HIGH (ref 11.4–15.2)

## 2022-06-22 LAB — APTT: aPTT: 38 seconds — ABNORMAL HIGH (ref 24–36)

## 2022-06-22 MED ORDER — LACTATED RINGERS IV SOLN
INTRAVENOUS | Status: AC
Start: 2022-06-22 — End: 2022-06-23

## 2022-06-22 MED ORDER — SODIUM CHLORIDE 0.9 % IV SOLN
2.0000 g | Freq: Once | INTRAVENOUS | Status: AC
  Administered 2022-06-23: 2 g via INTRAVENOUS
  Filled 2022-06-22: qty 12.5

## 2022-06-22 MED ORDER — METRONIDAZOLE 500 MG/100ML IV SOLN
500.0000 mg | Freq: Two times a day (BID) | INTRAVENOUS | Status: AC
  Administered 2022-06-22 – 2022-06-29 (×14): 500 mg via INTRAVENOUS
  Filled 2022-06-22 (×14): qty 100

## 2022-06-22 MED ORDER — POLYETHYLENE GLYCOL 3350 17 G PO PACK: 17.0000 g | PACK | Freq: Every day | ORAL | Status: DC | PRN

## 2022-06-22 MED ORDER — ACETAMINOPHEN 650 MG RE SUPP: 650.0000 mg | Freq: Four times a day (QID) | RECTAL | Status: DC | PRN

## 2022-06-22 MED ORDER — SODIUM CHLORIDE 0.9% FLUSH
3.0000 mL | Freq: Two times a day (BID) | INTRAVENOUS | Status: DC
Start: 2022-06-22 — End: 2022-07-03
  Administered 2022-06-22 – 2022-07-02 (×14): 3 mL via INTRAVENOUS

## 2022-06-22 MED ORDER — ATORVASTATIN CALCIUM 40 MG PO TABS
40.0000 mg | ORAL_TABLET | Freq: Every day | ORAL | Status: DC
  Administered 2022-06-23: 40 mg via ORAL
  Filled 2022-06-22: qty 1

## 2022-06-22 MED ORDER — LACTATED RINGERS IV BOLUS
1000.0000 mL | Freq: Once | INTRAVENOUS | Status: AC
  Administered 2022-06-22: 1000 mL via INTRAVENOUS

## 2022-06-22 MED ORDER — ACETAMINOPHEN 325 MG PO TABS
650.0000 mg | ORAL_TABLET | Freq: Four times a day (QID) | ORAL | Status: DC | PRN
  Administered 2022-06-30 – 2022-07-01 (×2): 650 mg via ORAL
  Filled 2022-06-22 (×3): qty 2

## 2022-06-22 MED ORDER — VANCOMYCIN HCL 2000 MG/400ML IV SOLN
2000.0000 mg | Freq: Once | INTRAVENOUS | Status: AC
  Administered 2022-06-23: 2000 mg via INTRAVENOUS
  Filled 2022-06-22: qty 400

## 2022-06-22 MED ORDER — ONDANSETRON HCL 4 MG/2ML IJ SOLN
4.0000 mg | Freq: Once | INTRAMUSCULAR | Status: AC
  Administered 2022-06-22: 4 mg via INTRAVENOUS
  Filled 2022-06-22: qty 2

## 2022-06-22 MED ORDER — INSULIN ASPART 100 UNIT/ML IJ SOLN
0.0000 [IU] | INTRAMUSCULAR | Status: DC
  Administered 2022-06-23 (×3): 3 [IU] via SUBCUTANEOUS
  Administered 2022-06-23 (×2): 2 [IU] via SUBCUTANEOUS
  Administered 2022-06-24: 3 [IU] via SUBCUTANEOUS
  Administered 2022-06-24: 2 [IU] via SUBCUTANEOUS
  Administered 2022-06-24 (×3): 3 [IU] via SUBCUTANEOUS
  Administered 2022-06-25 – 2022-06-26 (×5): 2 [IU] via SUBCUTANEOUS
  Administered 2022-06-26: 3 [IU] via SUBCUTANEOUS
  Administered 2022-06-26: 2 [IU] via SUBCUTANEOUS
  Administered 2022-06-26 – 2022-06-27 (×2): 3 [IU] via SUBCUTANEOUS
  Administered 2022-06-27 – 2022-06-28 (×5): 2 [IU] via SUBCUTANEOUS
  Administered 2022-06-28: 3 [IU] via SUBCUTANEOUS
  Administered 2022-06-29: 2 [IU] via SUBCUTANEOUS
  Administered 2022-06-29: 3 [IU] via SUBCUTANEOUS
  Administered 2022-06-30 (×3): 2 [IU] via SUBCUTANEOUS
  Administered 2022-07-01: 5 [IU] via SUBCUTANEOUS
  Administered 2022-07-01 – 2022-07-02 (×2): 3 [IU] via SUBCUTANEOUS
  Administered 2022-07-02 – 2022-07-03 (×5): 2 [IU] via SUBCUTANEOUS

## 2022-06-22 NOTE — ED Notes (Signed)
Blood culture results given to Dr. Rush Landmark bottle 1 of 2 gram variables rods, BCID -not detected.

## 2022-06-22 NOTE — ED Provider Notes (Incomplete)
[facility] EMERGENCY DEPARTMENT Provider Note   CSN: [id number] Arrival date & time: 06/22/22  1941     History {Add pertinent medical, surgical, social history, OB history to HPI:1} Chief Complaint  Patient presents with  . sepsis    Jesse [name] is a 61 y.o. male.  HPI   61 year old male presents emergency department with complaints of "feeling ill."  Patient is reported to be confused now by sister who is at bedside.  She seems well as primary historian.  Patient stepped on a nail approximately 4 months ago on his right foot.  He is known uncontrolled diabetic with neuropathy.  He has not taken care of the wound appropriately and it has progressed.  He has noticed consistent drainage from the wound area.  Notes some associated pain in areas where he can feel.  He also has a wound that is been present on his left great toe with known underlying osteomyelitis that is being seen by an orthopedic outpatient.  Patient denies fever, chills, chest pain, shortness of breath, abdominal pain, vomiting, urinary symptoms, change in bowel habits.  He does state that he has been able to get up to use the restroom and has been urinating and defecating on himself.  He was recently seen 2 days ago, at [facility] for the same symptoms but left AGAINST MEDICAL ADVICE.  He is now coming by sister and is agreed to admission to the hospital.  Past medical history significant for diabetes mellitus type 2, diabetic ulcers, hypertension, alcohol abuse, osteomyelitis  Home Medications Prior to Admission medications   Medication Sig Start Date End Date Taking? Authorizing Provider  Accu-Chek Softclix Lancets lancets Use as directed. 04/20/21   Patrecia Pour, MD  amoxicillin-clavulanate (AUGMENTIN) 875-125 MG tablet Take 1 tablet by mouth every 12 (twelve) hours. 12/09/21   Caccavale, Sophia, PA-C  atorvastatin (LIPITOR) 40 MG tablet Take 1 tablet (40 mg total) by mouth daily. 04/20/21    Patrecia Pour, MD  Blood Glucose Monitoring Suppl (ACCU-CHEK GUIDE) w/Device KIT Use as directed 04/20/21   Patrecia Pour, MD  doxycycline (VIBRA-TABS) 100 MG tablet Take 1 tablet (100 mg total) by mouth 2 (two) times daily. 03/26/22   Suzan Slick, NP  glucose blood (ACCU-CHEK GUIDE) test strip Use as instructed 04/20/21   Patrecia Pour, MD  insulin glargine (LANTUS) 100 UNIT/ML Solostar Pen INJECT 10 UNITS INTO THE SKIN DAILY. 04/20/21   Patrecia Pour, MD  lidocaine (XYLOCAINE) 2 % solution Use as directed 15 mLs in the mouth or throat as needed for mouth pain. 12/09/21   Caccavale, Sophia, PA-C  losartan (COZAAR) 25 MG tablet TAKE 1 TABLET (25 MG TOTAL) BY MOUTH DAILY. 04/20/21 04/20/22  Patrecia Pour, MD  oxyCODONE-acetaminophen (PERCOCET/ROXICET) 5-325 MG tablet Take 1 tablet by mouth every 4 (four) hours as needed for severe pain. 04/30/21   Newt Minion, MD  silver sulfADIAZINE (SILVADENE) 1 % cream Apply 1 application. topically daily. 03/26/22   Suzan Slick, NP  glipiZIDE (GLUCOTROL) 5 MG tablet Take 1 tablet (5 mg total) by mouth daily before breakfast. Patient not taking: Reported on 01/14/2016 01/31/14 01/14/16  Delfina Redwood, MD      Allergies    Patient has no known allergies.    Review of Systems   Review of Systems  Physical Exam Updated Vital Signs BP 108/71   Pulse 90   Temp 98.1 F (36.7 C) (Oral)   Resp Marland Kitchen)  22   SpO2 98%  Physical Exam  ED Results / Procedures / Treatments   Labs (all labs ordered are listed, but only abnormal results are displayed) Labs Reviewed  CULTURE, BLOOD (ROUTINE X 2)  CULTURE, BLOOD (ROUTINE X 2)  URINE CULTURE  LACTIC ACID, PLASMA  LACTIC ACID, PLASMA  COMPREHENSIVE METABOLIC PANEL  CBC WITH DIFFERENTIAL/PLATELET  PROTIME-INR  APTT  URINALYSIS, ROUTINE W REFLEX MICROSCOPIC    EKG None  Radiology DG Chest Port 1 View  Result Date: 06/22/2022 CLINICAL DATA:  Questionable sepsis.  Altered mental status. EXAM: PORTABLE CHEST  1 VIEW COMPARISON:  Portable chest 04/14/2021 FINDINGS: The cardiac size is normal. No vascular congestion is seen. There is a normal mediastinal configuration. Again noted is prominent elevation of the right hemidiaphragm with overlying linear atelectasis. This obscures the right lower lung field the visualized lungs are clear. The sulci are sharp. Thoracic spondylosis. IMPRESSION: The visualized lungs are clear, but with obscuration of the right lower lung field due to a chronically elevated right hemidiaphragm. The cardiac size is normal. Electronically Signed   By: Telford Nab M.D.   On: 06/22/2022 21:36    Procedures Procedures  {Document cardiac monitor, telemetry assessment procedure when appropriate:1}  Medications Ordered in ED Medications - No data to display  ED Course/ Medical Decision Making/ A&P                           Medical Decision Making Amount and/or Complexity of Data Reviewed Labs: ordered. Radiology: ordered. ECG/medicine tests: ordered.  Risk Prescription drug management. Decision regarding hospitalization.   ***  {Document critical care time when appropriate:1} {Document review of labs and clinical decision tools ie heart score, Chads2Vasc2 etc:1}  {Document your independent review of radiology images, and any outside records:1} {Document your discussion with family members, caretakers, and with consultants:1} {Document social determinants of health affecting pt's care:1} {Document your decision making why or why not admission, treatments were needed:1} Final Clinical Impression(s) / ED Diagnoses Final diagnoses:  None    Rx / DC Orders ED Discharge Orders     None

## 2022-06-22 NOTE — H&P (Signed)
History and Physical   Jesse [name] EPP:[id number] DOB: [birth year] DOA: 06/22/2022  PCP: Pcp, No   Patient coming from: Home  Chief Complaint: Altered mental status  HPI: Jesse [name] is a 61 y.o. male with medical history significant of diabetes, infections, hypertension, hyperlipidemia, ethanol use presenting with altered mental status.  History obtained assistance of family and chart review.  Patient stepped on a nail around 6 months ago and at this point has a "thumb sized hole "in right foot.  He also is injured his left foot unknown as to when and how.  On EMS arrival he had been incontinent of stool and was in his stool.  Received a liter of fluids in route.  Of note, patient presented to the ED 2 days ago for his wounds and nausea vomiting.  Full note not yet available but upon review of records patient noted to have imaging showing right foot cellulitis and foreign body as well as left foot possible osteomyelitis.  Plan was for cefepime and admission but it appears patient left AMA.  Patient altered for EMS, and on my evaluation room and somewhat confused but is answering actions of appropriately and follows commands.  Family confirms patient remains altered.  He denies chills, chest pain, constipation, diarrhea  ED Course: Vital signs in the ED significant for respirate in the 20s to 30s.  Lab work-up included CMP with sodium 133, bicarb 13, creatinine elevated to 4.19 (was 4.72 days ago and 1 year ago was near normal at 1.1-1.6 closed disease, glucose 267, calcium 8.3, albumin 2.0, AST 110, ALT 75, ALP 157, T. bili 1.5.  Lactic acid 2.0 with repeat pending.  CBC with hemoglobin stable at 12.1 and significant leukocytosis to 40.6.  PT elevated to 19.9, INR elevated to 1.7, PTT elevated to 38.  Urinalysis and urine culture and blood cultures pending.  Patient did have blood cultures 2 days ago showing gram variable rods in anaerobic bottle only.  Chest x-ray here showed no acute  abnormality and elevated right hemidiaphragm.  MRI of the right foot is pending.  Foot imaging during previous evaluation 2 days ago showed cellulitis and foreign body of the right foot and suspected osteomyelitis of the left foot.  Patient has received Flagyl and cefepime in the ED as well as 2 L of fluid in addition to the 1 L fluid received in route and a dose of Zofran.  Orthopedics has been told in the ED and will see the patient.  Review of Systems: As per HPI otherwise all other systems reviewed and are negative.   Past Medical History:  Diagnosis Date   AKI (acute kidney injury) ([place]) 01/27/2014   Alcohol abuse    Anasarca    Bilateral leg edema 02/07/2021   Cellulitis    Cellulitis and abscess 01/26/2014   Cellulitis of right foot 04/17/2021   Dental abscess 01/26/2014   Diabetes mellitus type 2 in obese [facility])    Diabetic foot infection ([place]) 04/08/2021   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 2 diabetes mellitus, limited to breakdown of skin ([place])    Facial cellulitis 01/26/2014   HTN (hypertension)    Hypokalemia 01/28/2014   Infected dental carries 01/26/2014   Leukocytosis 01/27/2014   Osteomyelitis ([place]) 04/14/2021   Right foot infection     Past Surgical History:  Procedure Laterality Date   I & D EXTREMITY Right 04/18/2021   Procedure: IRRIGATION AND DEBRIDEMENT OF FOOT;  Surgeon: Meridee Score  V, MD;  Location: [place] [facility];  Service: Orthopedics;  Laterality: Right;   I & D EXTREMITY Right 04/20/2021   Procedure: EXCISIONAL DEBRIDEMENT RIGHT FOOT, APPLICATION OF SKIN GRAFT;  Surgeon: Newt Minion, MD;  Location: [facility];  Service: Orthopedics;  Laterality: Right;    Social History  reports that he has never smoked. He has never used smokeless tobacco. He reports current alcohol use of about 2.0 standard drinks of alcohol per week. He reports that he does not use drugs.  No Known Allergies  No family history on file.  Prior to Admission medications   Medication  Sig Start Date End Date Taking? Authorizing Provider  Accu-Chek Softclix Lancets lancets Use as directed. 04/20/21   Patrecia Pour, MD  amoxicillin-clavulanate (AUGMENTIN) 875-125 MG tablet Take 1 tablet by mouth every 12 (twelve) hours. 12/09/21   Caccavale, Sophia, PA-C  atorvastatin (LIPITOR) 40 MG tablet Take 1 tablet (40 mg total) by mouth daily. 04/20/21   Patrecia Pour, MD  Blood Glucose Monitoring Suppl (ACCU-CHEK GUIDE) w/Device KIT Use as directed 04/20/21   Patrecia Pour, MD  doxycycline (VIBRA-TABS) 100 MG tablet Take 1 tablet (100 mg total) by mouth 2 (two) times daily. 03/26/22   Suzan Slick, NP  glucose blood (ACCU-CHEK GUIDE) test strip Use as instructed 04/20/21   Patrecia Pour, MD  insulin glargine (LANTUS) 100 UNIT/ML Solostar Pen INJECT 10 UNITS INTO THE SKIN DAILY. 04/20/21   Patrecia Pour, MD  lidocaine (XYLOCAINE) 2 % solution Use as directed 15 mLs in the mouth or throat as needed for mouth pain. 12/09/21   Caccavale, Sophia, PA-C  losartan (COZAAR) 25 MG tablet TAKE 1 TABLET (25 MG TOTAL) BY MOUTH DAILY. 04/20/21 04/20/22  Patrecia Pour, MD  oxyCODONE-acetaminophen (PERCOCET/ROXICET) 5-325 MG tablet Take 1 tablet by mouth every 4 (four) hours as needed for severe pain. 04/30/21   Newt Minion, MD  silver sulfADIAZINE (SILVADENE) 1 % cream Apply 1 application. topically daily. 03/26/22   Suzan Slick, NP  glipiZIDE (GLUCOTROL) 5 MG tablet Take 1 tablet (5 mg total) by mouth daily before breakfast. Patient not taking: Reported on 01/14/2016 01/31/14 01/14/16  Delfina Redwood, MD    Physical Exam: Vitals:   06/22/22 2045 06/22/22 2100 06/22/22 2130 06/22/22 2250  BP: 126/73 108/71 108/75 115/74  Pulse: 90 90 88 95  Resp: (!) 32 (!) 22 (!) 31 (!) 24  Temp:      TempSrc:      SpO2: 98% 98% 98% 100%    Physical Exam Constitutional:      General: He is not in acute distress.    Appearance: Normal appearance. He is ill-appearing.  HENT:     Head: Normocephalic and  atraumatic.     Mouth/Throat:     Mouth: Mucous membranes are moist.     Pharynx: Oropharynx is clear.  Eyes:     Extraocular Movements: Extraocular movements intact.     Pupils: Pupils are equal, round, and reactive to light.  Cardiovascular:     Rate and Rhythm: Normal rate and regular rhythm.     Pulses: Normal pulses.     Heart sounds: Normal heart sounds.  Pulmonary:     Effort: Pulmonary effort is normal. Tachypnea present. No respiratory distress.     Breath sounds: Normal breath sounds.  Abdominal:     General: Bowel sounds are normal. There is no distension.     Palpations: Abdomen is soft.  Tenderness: There is no abdominal tenderness.  Musculoskeletal:        General: No swelling or deformity.  Skin:    General: Skin is warm and dry.  Neurological:     General: No focal deficit present.     Mental Status: Mental status is at baseline.    Labs on Admission: I have personally reviewed following labs and imaging studies  CBC: Recent Labs  Lab 06/20/22 1519 06/22/22 2000  WBC 28.3* 40.6*  NEUTROABS 22.0* 38.2*  HGB 13.4 12.1*  HCT 38.2* 34.4*  MCV 99.0 98.0  PLT 163 413    Basic Metabolic Panel: Recent Labs  Lab 06/20/22 1519 06/22/22 2000  NA 132* 133*  K 3.6 3.6  CL 101 107  CO2 15* 13*  GLUCOSE 221* 267*  BUN 58* 84*  CREATININE 4.78* 4.19*  CALCIUM 8.4* 8.3*    GFR: CrCl cannot be calculated (Unknown ideal weight.).  Liver Function Tests: Recent Labs  Lab 06/20/22 1519 06/22/22 2000  AST 89* 110*  ALT 46* 75*  ALKPHOS 94 157*  BILITOT 1.3* 1.5*  PROT 7.4 6.9  ALBUMIN 2.5* 2.0*    Urine analysis:    Component Value Date/Time   COLORURINE YELLOW 02/07/2021 1521   APPEARANCEUR CLEAR 02/07/2021 1521   LABSPEC 1.014 02/07/2021 1521   PHURINE 5.0 02/07/2021 1521   GLUCOSEU 150 (A) 02/07/2021 1521   HGBUR SMALL (A) 02/07/2021 1521   BILIRUBINUR NEGATIVE 02/07/2021 1521   KETONESUR NEGATIVE 02/07/2021 1521   PROTEINUR NEGATIVE  02/07/2021 1521   NITRITE NEGATIVE 02/07/2021 1521   LEUKOCYTESUR NEGATIVE 02/07/2021 1521    Radiological Exams on Admission: DG Chest Port 1 View  Result Date: 06/22/2022 CLINICAL DATA:  Questionable sepsis.  Altered mental status. EXAM: PORTABLE CHEST 1 VIEW COMPARISON:  Portable chest 04/14/2021 FINDINGS: The cardiac size is normal. No vascular congestion is seen. There is a normal mediastinal configuration. Again noted is prominent elevation of the right hemidiaphragm with overlying linear atelectasis. This obscures the right lower lung field the visualized lungs are clear. The sulci are sharp. Thoracic spondylosis. IMPRESSION: The visualized lungs are clear, but with obscuration of the right lower lung field due to a chronically elevated right hemidiaphragm. The cardiac size is normal. Electronically Signed   By: Telford Nab M.D.   On: 06/22/2022 21:36    EKG: Independently reviewed.  Sinus rhythm at 86 bpm.  Nonspecific ST changes possibly mild J-point elevation.  Assessment/Plan Principal Problem:   Sepsis ([place]) Active Problems:   DM (diabetes mellitus) ([place])   AKI (acute kidney injury) ([place])   HTN (hypertension)   HLD (hyperlipidemia)   Osteomyelitis ([facility])   Cellulitis in diabetic foot ([place])   Diabetic foot infection ([place])   Sepsis Diabetic foot infection Cellulitis Osteomyelitis Foreign body Altered mental status > Patient presenting with altered mental status in the setting of known diabetic foot wounds (stepped on a nail 6 months ago and has a wound in his right foot which appears to probe to bone, also injured left foot with unknown time and mechanism) and recently evaluated presumed diabetic foot infection. > Seen 2 days ago in the ED but left AMA.  At that time imaging noted to have cellulitis and foreign body of the right foot and suspected osteomyelitis of the left foot.  Known wounds in both feet. > Altered today and was found soiled in stool on EMS arrival.   Work-up in the ED showed leukocytosis to 40.6.  Tachypnea.  Elevated lactic acid  to 2.0 with repeat pending.  With source of foot and altered mental status in addition as well as AKI patient meets criteria for sepsis and possibly severe sepsis.  Otherwise remains hemodynamically stable. > Urine culture, urinalysis, blood culture pending.  Blood culture from 2 days ago showed gram variable rods and anaerobic bottle only. > Patient started on vancomycin, cefepime and Flagyl in the ED and received 2 L of fluid in addition to 1 L received in route by EMS, also received Zofran. > Orthopedics consulted and will see the patient. - Monitor on progressive unit with telemetry - Appreciate orthopedics recommendations - Continue with cefepime and Flagyl, vancomycin - Continue with IV fluids overnight - Trend fever curve and WBC - Follow-up urinalysis, urine culture, blood culture - n.p.o. at midnight - Check VBG  AKI > Creatinine bili elevated to 4.19 which is somewhat improved from 2 days ago at 4.7.  However unclear baseline as last labs were a year ago but at that time his creatinine ranged from 1.1-1.6. > Family reports history of solitary kidney unclear what side.  Some distended abdomen. > Unclear if there is any degree of underlying CKD. > Possibly related to decreased p.o. intake and sepsis as above. > Received 3 L thus far in the ED and with EMS. - Continue with IV fluids - Trend renal function electrolytes - Avoid nephrotoxic agents - Bladder scan and renal ultrasound.  Diabetes - SSI  Hypertension - Hold home antihypertensives in the setting of developing sepsis  Hyperlipidemia - Continue home atorvastatin  DVT prophylaxis: SCDs Code Status:   Full Family Communication:  Sister updated at bedside she is working to get her other brother's contact information into the system so that he can be updated while patient is here as well. Disposition Plan:   Patient is  from:  Home  Anticipated DC to:  Home  Anticipated DC date:  3 to 7 days  Anticipated DC barriers: None  Consults called:  Orthopedics, consulted by EDP, will see the patient Admission status:  Inpatient, progressive  Severity of Illness: The appropriate patient status for this patient is INPATIENT. Inpatient status is judged to be reasonable and necessary in order to provide the required intensity of service to ensure the patient's safety. The patient's presenting symptoms, physical exam findings, and initial radiographic and laboratory data in the context of their chronic comorbidities is felt to place them at high risk for further clinical deterioration. Furthermore, it is not anticipated that the patient will be medically stable for discharge from the hospital within 2 midnights of admission.   * I certify that at the point of admission it is my clinical judgment that the patient will require inpatient hospital care spanning beyond 2 midnights from the point of admission due to high intensity of service, high risk for further deterioration and high frequency of surveillance required.Marcelyn Bruins MD [facility]  How to contact the [facility] Attending or Consulting provider [place] or covering provider during after hours [place], for this patient?   Check the care team in [facility] and look for a) attending/consulting [facility] provider listed and b) the [facility] team listed Log into [email or web address] and use [place]'s universal password to access. If you do not have the password, please contact the hospital operator. Locate the [facility] provider you are looking for under [facility] and page to a number that you can be directly reached. If you still have difficulty reaching the provider,  please page the [facility] (Director on Call) for the Hospitalists listed on amion for assistance.  06/22/2022, 11:49 PM

## 2022-06-22 NOTE — ED Provider Notes (Signed)
[facility] EMERGENCY DEPARTMENT Provider Note   CSN: [id number] Arrival date & time: 06/22/22  1941     History  Chief Complaint  Patient presents with   sepsis    Jesse [name] is a 61 y.o. male.  HPI   61 year old male presents emergency department with complaints of "feeling ill."  Patient is reported to be confused now by sister who is at bedside.  She seems well as primary historian.  Patient stepped on a nail approximately 4 months ago on his right foot.  He is known uncontrolled diabetic with neuropathy.  He has not taken care of the wound appropriately and it has progressed.  He has noticed consistent drainage from the wound area.  Notes some associated pain in areas where he can feel.  He also has a wound that is been present on his left great toe with known underlying osteomyelitis that is being seen by an orthopedic outpatient.  Patient denies fever, chills, chest pain, shortness of breath, abdominal pain, vomiting, urinary symptoms, change in bowel habits.  He does state that he has been able to get up to use the restroom and has been urinating and defecating on himself.  He was recently seen 2 days ago, at [facility] for the same symptoms but left AGAINST MEDICAL ADVICE.  He is now coming by sister and is agreed to admission to the hospital.  Past medical history significant for diabetes mellitus type 2, diabetic ulcers, hypertension, alcohol abuse, osteomyelitis  Home Medications Prior to Admission medications   Medication Sig Start Date End Date Taking? Authorizing Provider  Accu-Chek Softclix Lancets lancets Use as directed. 04/20/21   Patrecia Pour, MD  amoxicillin-clavulanate (AUGMENTIN) 875-125 MG tablet Take 1 tablet by mouth every 12 (twelve) hours. 12/09/21   Caccavale, Sophia, PA-C  atorvastatin (LIPITOR) 40 MG tablet Take 1 tablet (40 mg total) by mouth daily. 04/20/21   Patrecia Pour, MD  Blood Glucose Monitoring Suppl (ACCU-CHEK GUIDE)  w/Device KIT Use as directed 04/20/21   Patrecia Pour, MD  doxycycline (VIBRA-TABS) 100 MG tablet Take 1 tablet (100 mg total) by mouth 2 (two) times daily. 03/26/22   Suzan Slick, NP  glucose blood (ACCU-CHEK GUIDE) test strip Use as instructed 04/20/21   Patrecia Pour, MD  insulin glargine (LANTUS) 100 UNIT/ML Solostar Pen INJECT 10 UNITS INTO THE SKIN DAILY. 04/20/21   Patrecia Pour, MD  lidocaine (XYLOCAINE) 2 % solution Use as directed 15 mLs in the mouth or throat as needed for mouth pain. 12/09/21   Caccavale, Sophia, PA-C  losartan (COZAAR) 25 MG tablet TAKE 1 TABLET (25 MG TOTAL) BY MOUTH DAILY. 04/20/21 04/20/22  Patrecia Pour, MD  oxyCODONE-acetaminophen (PERCOCET/ROXICET) 5-325 MG tablet Take 1 tablet by mouth every 4 (four) hours as needed for severe pain. 04/30/21   Newt Minion, MD  silver sulfADIAZINE (SILVADENE) 1 % cream Apply 1 application. topically daily. 03/26/22   Suzan Slick, NP  glipiZIDE (GLUCOTROL) 5 MG tablet Take 1 tablet (5 mg total) by mouth daily before breakfast. Patient not taking: Reported on 01/14/2016 01/31/14 01/14/16  Delfina Redwood, MD      Allergies    Patient has no known allergies.    Review of Systems   Review of Systems  All other systems reviewed and are negative.   Physical Exam Updated Vital Signs BP 108/71   Pulse 90   Temp 98.1 F (36.7 C) (Oral)   Resp Marland Kitchen)  22   SpO2 98%  Physical Exam Vitals and nursing note reviewed.  Constitutional:      General: He is not in acute distress.    Appearance: He is well-developed. He is obese. He is ill-appearing.  HENT:     Head: Normocephalic and atraumatic.  Eyes:     Conjunctiva/sclera: Conjunctivae normal.  Cardiovascular:     Rate and Rhythm: Normal rate and regular rhythm.     Heart sounds: No murmur heard. Pulmonary:     Effort: Pulmonary effort is normal. No respiratory distress.     Breath sounds: Normal breath sounds.  Abdominal:     Palpations: Abdomen is soft.     Tenderness:  There is no abdominal tenderness. There is no guarding.  Musculoskeletal:        General: No swelling.     Cervical back: Neck supple.     Right lower leg: Edema present.     Left lower leg: Edema present.     Comments: Patient has full range of motion of bilateral lower extremities at the hip, knee, ankle, toes.  Large gaping hole noted on the plantar aspect of patient's right foot.  See media for more details.  Area is thumb sized and can be probed to bone.  Patient's left great toe showed macerated skin with some necrotic tissue.  Skin:    General: Skin is warm and dry.     Capillary Refill: Capillary refill takes less than 2 seconds.  Neurological:     Mental Status: He is alert.  Psychiatric:        Mood and Affect: Mood normal.     ED Results / Procedures / Treatments   Labs (all labs ordered are listed, but only abnormal results are displayed) Labs Reviewed  CULTURE, BLOOD (ROUTINE X 2)  CULTURE, BLOOD (ROUTINE X 2)  URINE CULTURE  LACTIC ACID, PLASMA  LACTIC ACID, PLASMA  COMPREHENSIVE METABOLIC PANEL  CBC WITH DIFFERENTIAL/PLATELET  PROTIME-INR  APTT  URINALYSIS, ROUTINE W REFLEX MICROSCOPIC    EKG None  Radiology DG Chest Port 1 View  Result Date: 06/22/2022 CLINICAL DATA:  Questionable sepsis.  Altered mental status. EXAM: PORTABLE CHEST 1 VIEW COMPARISON:  Portable chest 04/14/2021 FINDINGS: The cardiac size is normal. No vascular congestion is seen. There is a normal mediastinal configuration. Again noted is prominent elevation of the right hemidiaphragm with overlying linear atelectasis. This obscures the right lower lung field the visualized lungs are clear. The sulci are sharp. Thoracic spondylosis. IMPRESSION: The visualized lungs are clear, but with obscuration of the right lower lung field due to a chronically elevated right hemidiaphragm. The cardiac size is normal. Electronically Signed   By: Telford Nab M.D.   On: 06/22/2022 21:36     Procedures .Critical Care  Performed by: Wilnette Kales, PA Authorized by: Wilnette Kales, PA   Critical care provider statement:    Critical care time (minutes):  35   Critical care was necessary to treat or prevent imminent or life-threatening deterioration of the following conditions:  Sepsis   Critical care was time spent personally by me on the following activities:  Development of treatment plan with patient or surrogate, discussions with consultants, evaluation of patient's response to treatment, examination of patient, ordering and review of laboratory studies, ordering and review of radiographic studies, ordering and performing treatments and interventions, pulse oximetry, re-evaluation of patient's condition and review of old charts   I assumed direction of critical care for this patient  from another provider in my specialty: no     Care discussed with: admitting provider       Medications Ordered in ED Medications - No data to display  ED Course/ Medical Decision Making/ A&P                           Medical Decision Making Amount and/or Complexity of Data Reviewed Labs: ordered. Radiology: ordered. ECG/medicine tests: ordered.  Risk Prescription drug management. Decision regarding hospitalization.   This patient presents to the ED for concern of sepsis, this involves an extensive number of treatment options, and is a complaint that carries with it a high risk of complications and morbidity.  The differential diagnosis includes sepsis, osteomyelitis, cellulitis, erysipelas, abscess,   Co morbidities that complicate the patient evaluation  See HPI   Additional history obtained:  Additional history obtained from EMR External records from outside source obtained and reviewed including blood cultures from 06/20/2022 indicating multiple gram-negative bacteria   Lab Tests:  I Ordered, and personally interpreted labs.  The pertinent results include:  Leukocytosis with a WBC of 40.6 with obvious left shift.  Mild anemia with a hemoglobin of 12.1.  Platelets within normal range.  UA insignificant for urinary tract infection with moderate hemoglobin, 50 glucose, 30 protein.  Initial lactic acid of 2.0.  aPTT of 38 with PT/INR of 19.9 and 1.7 respectively.  Hyponatremia with sodium of 133 which is supplemented with IV fluids.  Bicarb of 13.  Glucose of 267.  Acute renal injury with GFR of 16, BUN of 84 and creatinine of 4.19.  Transaminitis with AST of 110 and ALT of 75.  Alkaline phosphatase of 157 and total bilirubin 1.5.   Imaging Studies ordered:  I ordered imaging studies including chest x-ray I independently visualized and interpreted imaging which showed no acute cardiopulmonary process.  Obscuration of the right lower lung field due to chronically elevated right hemidiaphragm. I agree with the radiologist interpretation MRI was ordered of patient's right foot but pending upon admission.  Prior radiographic imaging from previous hospital stay of bilateral feet and tip of the fibula reviewed.  Cardiac Monitoring: / EKG:  The patient was maintained on a cardiac monitor.  I personally viewed and interpreted the cardiac monitored which showed an underlying rhythm of: Sinus rhythm   Consultations Obtained:  See ED course  Problem List / ED Course / Critical interventions / Medication management  Sepsis I ordered medication including 2 L of lactated Ringer's for rehydration.  Cefepime, vancomycin, Flagyl for broad-spectrum antibiotic coverage.   Reevaluation of the patient after these medicines showed that the patient improved I have reviewed the patients home medicines and have made adjustments as needed   Social Determinants of Health:  Obesity.  Diabetes and noncompliant with medication.   Test / Admission - Considered:  Sepsis Vitals signs significant for constantly tachypneic with increased respiratory rate.  Patient afebrile  and nontachycardic throughout visit.. Otherwise within normal range and stable throughout visit. Laboratory/imaging studies significant for: See above Patient symptoms most likely secondary to sepsis secondary to known osteomyelitis.  Patient has chronic wounds on bilateral feet of which she takes little to no care of outpatient.  He was septic upon presentation a couple days ago when he presented to the emergency department.  His symptoms and presentation have only worsened in the meantime.  He is agreeable to admission at this time.  Treatment plan was discussed with him and  his sister who is at bedside and they acknowledge understanding agreeable to said plan.  Sepsis protocol was followed.  Orthopedics and hospital medicine were consulted regarding the patient as depicted in ED course.  Patient was stable upon admission.        Final Clinical Impression(s) / ED Diagnoses Final diagnoses:  None    Rx / DC Orders ED Discharge Orders     None         Wilnette Kales, PA 06/23/22 [id number]    Fredia Sorrow, MD 07/04/22 1800

## 2022-06-22 NOTE — ED Notes (Signed)
Notified Excell Seltzer, PA-C ED provider of lactic of 2.0

## 2022-06-22 NOTE — ED Triage Notes (Signed)
Patient arrives with [facility] from home c/o AMS, and possible sepsis. Per EMS, patient stepped on a nail 6 months ago and has "thumb size hole" in right foot. Patient also has injury to left foot- unknown when/how this injury happened. Pt is a&o x2 to person and situation. Pt was d/c'd from hospital 2 days ago. On EMS arrival, patient was in bed soiled with diarrhea. 1L NS given by EMS.  EMS vitals:  BP 124/76 HR 92 Oral temp 100.2 RR 40 End tidal 22 CBG 284

## 2022-06-23 ENCOUNTER — Inpatient Hospital Stay (HOSPITAL_COMMUNITY): Payer: 59

## 2022-06-23 DIAGNOSIS — E11628 Type 2 diabetes mellitus with other skin complications: Secondary | ICD-10-CM

## 2022-06-23 DIAGNOSIS — R6520 Severe sepsis without septic shock: Secondary | ICD-10-CM

## 2022-06-23 DIAGNOSIS — G9340 Encephalopathy, unspecified: Secondary | ICD-10-CM

## 2022-06-23 DIAGNOSIS — M86171 Other acute osteomyelitis, right ankle and foot: Secondary | ICD-10-CM

## 2022-06-23 DIAGNOSIS — L089 Local infection of the skin and subcutaneous tissue, unspecified: Secondary | ICD-10-CM

## 2022-06-23 DIAGNOSIS — A419 Sepsis, unspecified organism: Principal | ICD-10-CM

## 2022-06-23 LAB — URINALYSIS, ROUTINE W REFLEX MICROSCOPIC
Bilirubin Urine: NEGATIVE
Glucose, UA: 50 mg/dL — AB
Ketones, ur: NEGATIVE mg/dL
Leukocytes,Ua: NEGATIVE
Nitrite: NEGATIVE
Protein, ur: 30 mg/dL — AB
Specific Gravity, Urine: 1.011 (ref 1.005–1.030)
pH: 5 (ref 5.0–8.0)

## 2022-06-23 LAB — CBC
HCT: 32 % — ABNORMAL LOW (ref 39.0–52.0)
Hemoglobin: 11.9 g/dL — ABNORMAL LOW (ref 13.0–17.0)
MCH: 35 pg — ABNORMAL HIGH (ref 26.0–34.0)
MCHC: 37.2 g/dL — ABNORMAL HIGH (ref 30.0–36.0)
MCV: 94.1 fL (ref 80.0–100.0)
Platelets: 209 10*3/uL (ref 150–400)
RBC: 3.4 MIL/uL — ABNORMAL LOW (ref 4.22–5.81)
RDW: 13.6 % (ref 11.5–15.5)
WBC: 36.8 10*3/uL — ABNORMAL HIGH (ref 4.0–10.5)
nRBC: 0.1 % (ref 0.0–0.2)

## 2022-06-23 LAB — PROTIME-INR
INR: 1.6 — ABNORMAL HIGH (ref 0.8–1.2)
Prothrombin Time: 18.5 seconds — ABNORMAL HIGH (ref 11.4–15.2)

## 2022-06-23 LAB — COMPREHENSIVE METABOLIC PANEL
ALT: 76 U/L — ABNORMAL HIGH (ref 0–44)
AST: 106 U/L — ABNORMAL HIGH (ref 15–41)
Albumin: 1.7 g/dL — ABNORMAL LOW (ref 3.5–5.0)
Alkaline Phosphatase: 156 U/L — ABNORMAL HIGH (ref 38–126)
Anion gap: 12 (ref 5–15)
BUN: 80 mg/dL — ABNORMAL HIGH (ref 6–20)
CO2: 15 mmol/L — ABNORMAL LOW (ref 22–32)
Calcium: 8.3 mg/dL — ABNORMAL LOW (ref 8.9–10.3)
Chloride: 109 mmol/L (ref 98–111)
Creatinine, Ser: 3.63 mg/dL — ABNORMAL HIGH (ref 0.61–1.24)
GFR, Estimated: 18 mL/min — ABNORMAL LOW (ref >60)
Glucose, Bld: 203 mg/dL — ABNORMAL HIGH (ref 70–99)
Potassium: 3.5 mmol/L (ref 3.5–5.1)
Sodium: 136 mmol/L (ref 135–145)
Total Bilirubin: 1.3 mg/dL — ABNORMAL HIGH (ref 0.3–1.2)
Total Protein: 6.3 g/dL — ABNORMAL LOW (ref 6.5–8.1)

## 2022-06-23 LAB — CORTISOL-AM, BLOOD: Cortisol - AM: 21.9 ug/dL (ref 6.7–22.6)

## 2022-06-23 LAB — GLUCOSE, CAPILLARY
Glucose-Capillary: 195 mg/dL — ABNORMAL HIGH (ref 70–99)
Glucose-Capillary: 192 mg/dL — ABNORMAL HIGH (ref 70–99)
Glucose-Capillary: 154 mg/dL — ABNORMAL HIGH (ref 70–99)
Glucose-Capillary: 139 mg/dL — ABNORMAL HIGH (ref 70–99)
Glucose-Capillary: 130 mg/dL — ABNORMAL HIGH (ref 70–99)
Glucose-Capillary: 159 mg/dL — ABNORMAL HIGH (ref 70–99)

## 2022-06-23 LAB — CBG MONITORING, ED: Glucose-Capillary: 223 mg/dL — ABNORMAL HIGH (ref 70–99)

## 2022-06-23 LAB — PROCALCITONIN: Procalcitonin: 48.33 ng/mL

## 2022-06-23 LAB — LACTIC ACID, PLASMA: Lactic Acid, Venous: 1.7 mmol/L (ref 0.5–1.9)

## 2022-06-23 LAB — HIV ANTIBODY (ROUTINE TESTING W REFLEX): HIV Screen 4th Generation wRfx: NONREACTIVE

## 2022-06-23 MED ORDER — VANCOMYCIN VARIABLE DOSE PER UNSTABLE RENAL FUNCTION (PHARMACIST DOSING): Status: DC

## 2022-06-23 MED ORDER — SODIUM CHLORIDE 0.9 % IV SOLN
2.0000 g | INTRAVENOUS | Status: DC
  Administered 2022-06-23: 2 g via INTRAVENOUS
  Filled 2022-06-23: qty 12.5

## 2022-06-23 MED ORDER — SODIUM CHLORIDE 0.9 % IV SOLN
800.0000 mg | INTRAVENOUS | Status: DC
  Administered 2022-06-23: 800 mg via INTRAVENOUS
  Filled 2022-06-23: qty 16

## 2022-06-23 MED ORDER — HEPARIN SODIUM (PORCINE) 5000 UNIT/ML IJ SOLN
5000.0000 [IU] | Freq: Three times a day (TID) | INTRAMUSCULAR | Status: DC
  Administered 2022-06-23 – 2022-07-03 (×24): 5000 [IU] via SUBCUTANEOUS
  Filled 2022-06-23 (×27): qty 1

## 2022-06-23 NOTE — ED Notes (Addendum)
This RN and Maurice March, EMT did peri-care and applied brief, changed linens and gown. Patient has diarrhea. Skin abrasion notes to anterior lateral thighs and mid-aspect of buttocks. Applied moisturizing barrier cream to affected areas. Notified ED provider.

## 2022-06-23 NOTE — Progress Notes (Signed)
Pharmacy Antibiotic Note  Jesse [name] is a 61 y.o. male admitted on 06/22/2022 with  wound infection .  Pharmacy has been daptomycin.  Continues on Cefepime  Plan: Daptomycin 800 mg iv Q 48 hours Cefepime 2g IV q24h CK on Tuesday    Height: 6' (182.9 cm) Weight: 107.5 kg (236 lb 15.9 oz) IBW/kg (Calculated) : 77.6  Temp (24hrs), Avg:97.9 F (36.6 C), Min:97.3 F (36.3 C), Max:98.2 F (36.8 C)  Recent Labs  Lab 06/20/22 1519 06/20/22 1520 06/20/22 1941 06/22/22 2000 06/23/22 0114 06/23/22 0431  WBC 28.3*  --   --  40.6*  --  36.8*  CREATININE 4.78*  --   --  4.19*  --  3.63*  LATICACIDVEN  --  3.1* 2.2* 2.0* 1.7  --      Estimated Creatinine Clearance: 27.8 mL/min (A) (by C-G formula based on SCr of 3.63 mg/dL (H)).    No Known Allergies  Thank you  Okey Regal, PharmD

## 2022-06-23 NOTE — Plan of Care (Signed)
  Problem: Respiratory: Goal: Ability to maintain adequate ventilation will improve Outcome: Progressing   Problem: Education: Goal: Knowledge of General Education information will improve Description: Including pain rating scale, medication(s)/side effects and non-pharmacologic comfort measures Outcome: Progressing   

## 2022-06-23 NOTE — Progress Notes (Signed)
Patient ID: Jesse [name], male   DOB: 1961/10/02, 61 y.o.   MRN: [id number] Metal staple present right foot plantar surface. MRI suggestive of 2TMT joint infection and osteomyelitis of .    Will discuss in AM with pt about posting for debridement of right foot Monday afternoon surgery with removal of staple and VAC.

## 2022-06-23 NOTE — Consult Note (Addendum)
[facility]                                                                                        Patient Identification: Patient Name: Jesse [name] MRN: [id number] Admit Date: 06/22/2022  7:41 PM Today's Date: 06/23/2022 Reason for consult: septic arthritis and osteomyelitis  Requesting provider: Lynn Ito   Principal Problem:   Sepsis (HCC) Active Problems:   DM (diabetes mellitus) (HCC)   AKI (acute kidney injury) (HCC)   HTN (hypertension)   HLD (hyperlipidemia)   Osteomyelitis (HCC)   Cellulitis in diabetic foot (HCC)   Diabetic foot infection (HCC)   Antibiotics:  Vancomycin 8/10-c Cefepime 8/10-c Metronidazole 8/12-c  Lines/Hardware:  Assessment # 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 Please call with questions or concerns.  Thank you for the consult  , MD Infectious Disease Physician [facility] [address] [place], [place] Phone: [phone]  Fax:  [phone]  __________________________________________________________________________________________________________ HPI and Hospital Course: 61 year old male with PMH of DM, hypertension, hyperlipidemia, alcohol use, prior I and D of rt foot *2 in 6/8 ( cx Proteus mirabilis) and 04/20/2021 who presented to the ED on 8/12 with altered mental status . Patient is a poor historian and most of the history is obtained from chart review. Patient had stepped on a nail 4 months ago and had a thumb sized hole in his right foot consistent drainage from the wound.  Patient also has an injury in the left foot but unclear when this happened ( looks chronic with no acute symptoms).  Patient was showered in bed with diarrhea when EMS arrived and was given 1 L normal saline.  Patient denied fever, chills, sweats, chest pain, shortness of breath, nausea, vomiting abdominal pain or diarrhea or GU symptoms on presentation.  He however stated that he has been urinating and defecating on himself in ED.  Seen by Ortho last 04/30/22 at which time amputation of the great toes was discussed but seems patient did not want to do amputation.   Recently seen 2 days ago at [facility] ED  for nausea and vomiting and left AMA ( full note not available). Xray of bilateral legs were done.  Plan was for IV antibiotics and admission but patient left AMA  At ED, T max  100.5, WBC 40.6 LA 2.0 Lab remarkable for AKI w CR 4.19, ALP 157, AST 110. ALT 75, TB 1.5, INR 1.7   Imagings as below  ROS: poor historian, limited, as above   Past Medical History:  Diagnosis Date   AKI (acute kidney injury) (HCC) 01/27/2014   Alcohol abuse    Anasarca    Bilateral leg edema 02/07/2021   Cellulitis    Cellulitis and abscess 01/26/2014   Cellulitis of right foot 04/17/2021   Dental abscess 01/26/2014   Diabetes mellitus type 2 in obese [facility])    Diabetic foot infection (HCC) 04/08/2021   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 2 diabetes  mellitus, limited to breakdown of skin (HCC)    Facial cellulitis 01/26/2014   HTN (hypertension)    Hypokalemia 01/28/2014   Infected dental carries 01/26/2014   Leukocytosis 01/27/2014   Osteomyelitis (HCC) 04/14/2021   Right foot infection    Past Surgical History:  Procedure Laterality Date   I & D EXTREMITY Right 04/18/2021   Procedure: IRRIGATION AND DEBRIDEMENT OF FOOT;  Surgeon: Nadara Mustard, MD;  Location: [facility] OR;  Service: Orthopedics;  Laterality: Right;   I & D EXTREMITY Right 04/20/2021   Procedure: EXCISIONAL DEBRIDEMENT RIGHT FOOT, APPLICATION OF SKIN GRAFT;  Surgeon: Nadara Mustard, MD;  Location: [facility] OR;  Service: Orthopedics;  Laterality: Right;     Scheduled Meds:  atorvastatin  40 mg Oral Daily   insulin aspart  0-15 Units Subcutaneous Q4H   sodium chloride flush  3 mL Intravenous Q12H   vancomycin variable dose per unstable renal function (pharmacist dosing)   Does not apply See admin instructions   Continuous Infusions:  ceFEPime (MAXIPIME) IV     lactated ringers 125 mL/hr at 06/23/22 0813   metronidazole 500 mg (06/23/22 1059)   PRN Meds:.acetaminophen **OR** acetaminophen, polyethylene glycol  No Known Allergies  Social History   Socioeconomic History   Marital status: Single    Spouse name: Not on file   Number of children: Not on file   Years of education: 10th grade   Highest education level: Not on file  Occupational History   Occupation: Curator  Tobacco Use   Smoking status: Never   Smokeless tobacco: Never  Vaping Use   Vaping Use: Never used  Substance and Sexual Activity   Alcohol use: Yes    Alcohol/week: 2.0 standard drinks of alcohol    Types: 2 Cans of beer per week    Comment: Occasional   Drug use: No   Sexual activity: Not Currently    Partners: Female  Other Topics Concern   Not on file  Social History Narrative   Not on file   Social Determinants of Health   Financial Resource Strain: Not on file  Food Insecurity: Not  on file  Transportation Needs: Not on file  Physical Activity: Not on file  Stress: Not on file  Social Connections: Not on file  Intimate Partner Violence: Not on file  No family history on file.    Vitals BP 127/75 (BP Location: Right Arm)   Pulse 91   Temp 97.7 F (36.5 C) (Oral)   Resp 20   Ht 6' (1.829 m)   Wt 107.5 kg   SpO2 94%   BMI 32.14 kg/m    Physical Exam Constitutional:  Elderly male lying in the bed, not in acute distress     Comments:   Cardiovascular:     Rate  and Rhythm: Normal rate and regular rhythm.     Heart sounds:  Pulmonary:     Effort: Pulmonary effort is normal on room air    Comments:   Abdominal:     Palpations: Abdomen is soft.     Tenderness: non distended and non tender   Musculoskeletal:        General:        Skin:    Comments:   Neurological:     General: spontaneously moving all extremities, awake, alert and oriented   Psychiatric:        Mood and Affect: Mood normal.    Pertinent Microbiology Results for orders placed or performed during the hospital encounter of 06/22/22  Blood Culture (routine x 2)     Status: None (Preliminary result)   Collection Time: 06/22/22  8:00 PM   Specimen: BLOOD  Result Value Ref Range Status   Specimen Description BLOOD LEFT ANTECUBITAL  Final   Special Requests   Final    BOTTLES DRAWN AEROBIC AND ANAEROBIC Blood Culture results may not be optimal due to an excessive volume of blood received in culture bottles   Culture   Final    NO GROWTH < 12 HOURS Performed at [facility], [place] [address] [place], [place] [address]    Report Status PENDING  Incomplete  Blood Culture (routine x 2)     Status: None (Preliminary result)   Collection Time: 06/22/22  8:10 PM   Specimen: BLOOD  Result Value Ref Range Status   Specimen Description BLOOD RIGHT ANTECUBITAL  Final   Special Requests   Final    BOTTLES DRAWN AEROBIC AND ANAEROBIC Blood Culture adequate volume   Culture    Final    NO GROWTH < 12 HOURS Performed at [facility], [place] [address], [name], [place] [address]    Report Status PENDING  Incomplete   Pertinent Lab seen by me:    Latest Ref Rng & Units 06/23/2022    4:31 AM 06/22/2022    8:00 PM 06/20/2022    3:19 PM  CBC  WBC 4.0 - 10.5 K/uL 36.8  40.6  28.3   Hemoglobin 13.0 - 17.0 g/dL 11.9  12.1  13.4   Hematocrit 39.0 - 52.0 % 32.0  34.4  38.2   Platelets 150 - 400 K/uL 209  205  163       Latest Ref Rng & Units 06/23/2022    4:31 AM 06/22/2022    8:00 PM 06/20/2022    3:19 PM  CMP  Glucose 70 - 99 mg/dL 203  267  221   BUN 6 - 20 mg/dL 80  84  58   Creatinine 0.61 - 1.24 mg/dL 3.63  4.19  4.78   Sodium 135 - 145 mmol/L 136  133  132   Potassium 3.5 - 5.1 mmol/L 3.5  3.6  3.6   Chloride 98 - 111 mmol/L 109  107  101   CO2 22 - 32 mmol/L 15  13  15    Calcium 8.9 - 10.3 mg/dL 8.3  8.3  8.4   Total Protein 6.5 - 8.1 g/dL 6.3  6.9  7.4   Total Bilirubin 0.3 - 1.2 mg/dL 1.3  1.5  1.3   Alkaline Phos 38 - 126 U/L 156  157  94   AST 15 - 41 U/L 106  110  89   ALT 0 - 44 U/L 76  75  46  Pertinent Imagings/Other Imagings Plain films and CT images have been personally visualized and interpreted; radiology reports have been reviewed. Decision making incorporated into the Impression / Recommendations.  MRI Right foot without contrast  Result Date: 06/23/2022 CLINICAL DATA:  Diabetic foot wounds EXAM: MRI OF THE RIGHT FOREFOOT WITHOUT CONTRAST TECHNIQUE: Multiplanar, multisequence MR imaging of the right forefoot was performed. No intravenous contrast was administered. COMPARISON:  X-ray 06/20/2022, MRI 04/14/2021 FINDINGS: Bones/Joint/Cartilage Small second tarsometatarsal joint effusion. Bone marrow edema within the second metatarsal base extending to the level of the distal diaphysis with confluent low T1 signal changes in the second metatarsal base (series 6, image 14). There is patchy bone marrow edema and intermediate T1 signal  within the adjacent intermediate cuneiform. Findings are suspicious for septic arthritis with osteomyelitis. Patchy areas of marrow edema are also present within the navicular, medial and lateral cuneiform bones, and second metatarsal base. No focal erosion or confluent low T1 signal changes at these locations to suggest acute osteomyelitis at this time. No acute fracture or dislocation. Susceptibility artifact between the second and third toes as well as between the third and fourth toes with resultant poor regional fat saturation. Ligaments Intact Lisfranc ligament.  Collateral ligaments appear intact. Muscles and Tendons Findings of myositis and/or denervation. No large tenosynovial fluid collection. Soft tissues Plantar soft tissue ulceration underlying the midfoot. Prominent dorsal subcutaneous edema. No organized fluid collection is evident on noncontrast imaging. Additional site of susceptibility artifact within the plantar soft tissues adjacent to the site of ulceration corresponding to surgical clips seen radiographically. IMPRESSION: 1. 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 2. Patchy areas of marrow edema within the midfoot without focal erosion which could represent reactive osteitis or stress-related changes. 3. Plantar soft tissue ulceration underlying the midfoot. 4. Marked dorsal subcutaneous edema and likely cellulitis. No organized fluid collections. 5. Findings of diffuse myositis and/or denervation. Electronically Signed   By: Davina Poke D.O.   On: 06/23/2022 13:06   US RENAL  Result Date: 06/23/2022 CLINICAL DATA:  Acute renal injury EXAM: RENAL / URINARY TRACT ULTRASOUND COMPLETE COMPARISON:  CT from 02/09/2021 FINDINGS: Right Kidney: Renal measurements: 14.7 x 7.4 x 7.3 cm. = volume: 416 mL. Echogenicity within normal limits. No mass or hydronephrosis visualized. Left Kidney: Dysplastic left  kidney is not visualized. Bladder: Appears normal for degree of bladder distention. Other: None. 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 No acute abnormality noted. Electronically Signed   By: Inez Catalina M.D.   On: 06/23/2022 01:07   DG Chest Port 1 View  Result Date: 06/22/2022 CLINICAL DATA:  Questionable sepsis.  Altered mental status. EXAM: PORTABLE CHEST 1 VIEW COMPARISON:  Portable chest 04/14/2021 FINDINGS: The cardiac size is normal. No vascular congestion is seen. There is a normal mediastinal configuration. Again noted is prominent elevation of the right hemidiaphragm with overlying linear atelectasis. This obscures the right lower lung field the visualized lungs are clear. The sulci are sharp. Thoracic spondylosis. IMPRESSION: The visualized lungs are clear, but with obscuration of the right lower lung field due to a chronically elevated right hemidiaphragm. The cardiac size is normal. Electronically Signed   By: Telford Nab M.D.   On: 06/22/2022 21:36   DG Tibia/Fibula Left  Result Date: 06/20/2022 CLINICAL DATA:  Right lower extremity cellulitis. EXAM: LEFT TIBIA AND FIBULA - 2 VIEW COMPARISON:  None Available. FINDINGS: There is no evidence of fracture or other focal  bone lesions. No lytic destruction is noted. Soft tissues are unremarkable. IMPRESSION: Negative. Electronically Signed   By: Marijo Conception M.D.   On: 06/20/2022 16:53   DG Tibia/Fibula Right  Result Date: 06/20/2022 CLINICAL DATA:  Right lower extremity cellulitis. EXAM: RIGHT TIBIA AND FIBULA - 2 VIEW COMPARISON:  None Available. FINDINGS: There is no evidence of fracture or other focal bone lesions. No lytic destruction is seen. Soft tissues are unremarkable. IMPRESSION: Negative. Electronically Signed   By: Marijo Conception M.D.   On: 06/20/2022 16:52   DG Foot Complete Right  Result Date: 06/20/2022 CLINICAL DATA:  Cellulitis. EXAM: RIGHT FOOT COMPLETE - 3+ VIEW COMPARISON:  None  Available. FINDINGS: There is no evidence of fracture or dislocation. There is no evidence of arthropathy or other focal bone abnormality. Dorsal soft tissue swelling is noted. Large ulceration is seen involving the plantar soft tissues. Metallic staple is seen in this area concerning for foreign body. IMPRESSION: Dorsal soft tissue swelling is noted concerning for cellulitis. Large ulceration is seen involving the plantar soft tissues with adjacent metallic staple in this area concerning for foreign body. No definite lytic destruction is seen to suggest osteomyelitis. Electronically Signed   By: Marijo Conception M.D.   On: 06/20/2022 16:51   DG Foot Complete Left  Result Date: 06/20/2022 CLINICAL DATA:  Possible osteomyelitis.  Cellulitis. EXAM: LEFT FOOT - COMPLETE 3+ VIEW COMPARISON:  None Available. FINDINGS: Large ulceration is seen overlying the first distal phalanx. There appears to be lytic destruction involving the distal tuft of the first distal phalanx consistent with osteomyelitis. No fracture or dislocation is noted. IMPRESSION: Large ulceration involving the first toe with underlying lytic destruction of the distal tuft of first distal phalanx consistent with osteomyelitis. Electronically Signed   By: Marijo Conception M.D.   On: 06/20/2022 16:49    I spent 100 minutes for this patient encounter including review of prior medical records/discussing diagnostics and treatment plan with the patient/family/coordinate care with primary/other specialits with greater than 50% of time in face to face encounter.   Electronically signed by:   Rosiland Oz, MD Infectious Disease Physician [facility] Pager: [phone]

## 2022-06-23 NOTE — ED Notes (Signed)
Ultrasound tech at patient bedside

## 2022-06-23 NOTE — Consult Note (Addendum)
Reason for Consult:diabetic right and left foot infection, sepsis Referring Physician: Lynn Ito MD  Jesse [name] is an 61 y.o. male.  HPI: 61 year old diabetic follow-up for greater than a year by Dr. Due to with left great toe gangrene and previous right foot plantar foot ulcer after stepping on a nail he believes foreign body.  Patient walks barefooted states he cannot really stick himself in the stomach with the needle.  He has diabetes and was admitted for progressive right foot swelling.  Patient had a white count 36,000 with acute renal injury creatinine 3.63 BUN 80, hyperglycemia glucose greater than 200.  Patient's been feeling ill he states he likes to fish has been walking barefooted.  Was in the hospital 2 days ago and left AMA.  Has a sister in town who currently is not is at his bedside but is arriving shortly and was with him yesterday.  Past Medical History:  Diagnosis Date   AKI (acute kidney injury) (HCC) 01/27/2014   Alcohol abuse    Anasarca    Bilateral leg edema 02/07/2021   Cellulitis    Cellulitis and abscess 01/26/2014   Cellulitis of right foot 04/17/2021   Dental abscess 01/26/2014   Diabetes mellitus type 2 in obese [facility])    Diabetic foot infection (HCC) 04/08/2021   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 2 diabetes mellitus, limited to breakdown of skin (HCC)    Facial cellulitis 01/26/2014   HTN (hypertension)    Hypokalemia 01/28/2014   Infected dental carries 01/26/2014   Leukocytosis 01/27/2014   Osteomyelitis (HCC) 04/14/2021   Right foot infection     Past Surgical History:  Procedure Laterality Date   I & D EXTREMITY Right 04/18/2021   Procedure: IRRIGATION AND DEBRIDEMENT OF FOOT;  Surgeon: Nadara Mustard, MD;  Location: [facility] OR;  Service: Orthopedics;  Laterality: Right;   I & D EXTREMITY Right 04/20/2021   Procedure: EXCISIONAL DEBRIDEMENT RIGHT FOOT, APPLICATION OF SKIN GRAFT;  Surgeon: Nadara Mustard, MD;  Location: [facility] OR;   Service: Orthopedics;  Laterality: Right;    No family history on file.  Social History:  reports that he has never smoked. He has never used smokeless tobacco. He reports current alcohol use of about 2.0 standard drinks of alcohol per week. He reports that he does not use drugs.  Allergies: No Known Allergies  Medications: I have reviewed the patient's current medications.  Results for orders placed or performed during the hospital encounter of 06/22/22 (from the past 48 hour(s))  Lactic acid, plasma     Status: Abnormal   Collection Time: 06/22/22  8:00 PM  Result Value Ref Range   Lactic Acid, Venous 2.0 (HH) 0.5 - 1.9 mmol/L    Comment: CRITICAL RESULT CALLED TO, READ BACK BY AND VERIFIED WITH ZOHBI N,RN 06/22/22 2221 WAYK Performed at [facility], [address] [place], [place] [address]   Comprehensive metabolic panel     Status: Abnormal   Collection Time: 06/22/22  8:00 PM  Result Value Ref Range   Sodium 133 (L) 135 - 145 mmol/L   Potassium 3.6 3.5 - 5.1 mmol/L   Chloride 107 98 - 111 mmol/L   CO2 13 (L) 22 - 32 mmol/L   Glucose, Bld 267 (H) 70 - 99 mg/dL    Comment: Glucose reference range applies only to samples taken after fasting for at least 8 hours.   BUN 84 (H) 6 - 20  mg/dL   Creatinine, Ser 4.094.19 (H) 0.61 - 1.24 mg/dL   Calcium 8.3 (L) 8.9 - 10.3 mg/dL   Total Protein 6.9 6.5 - 8.1 g/dL   Albumin 2.0 (L) 3.5 - 5.0 g/dL   AST 811110 (H) 15 - 41 U/L   ALT 75 (H) 0 - 44 U/L   Alkaline Phosphatase 157 (H) 38 - 126 U/L   Total Bilirubin 1.5 (H) 0.3 - 1.2 mg/dL   GFR, Estimated 16 (L) >60 mL/min    Comment: (NOTE) Calculated using the CKD-EPI Creatinine Equation (2021)    Anion gap 13 5 - 15    Comment: Performed at [facility], [address] [place], [place] [address]  CBC with Differential     Status: Abnormal   Collection Time: 06/22/22  8:00 PM  Result Value Ref Range   WBC 40.6 (H) 4.0 - 10.5 K/uL   RBC 3.51 (L) 4.22 - 5.81 MIL/uL    Hemoglobin 12.1 (L) 13.0 - 17.0 g/dL   HCT 29.534.4 (L) 62.139.0 - 30.852.0 %   MCV 98.0 80.0 - 100.0 fL   MCH 34.5 (H) 26.0 - 34.0 pg   MCHC 35.2 30.0 - 36.0 g/dL   RDW 65.713.8 84.611.5 - 96.215.5 %   Platelets 205 150 - 400 K/uL   nRBC 0.1 0.0 - 0.2 %   Neutrophils Relative % 94 %   Neutro Abs 38.2 (H) 1.7 - 7.7 K/uL   Lymphocytes Relative 1 %   Lymphs Abs 0.4 (L) 0.7 - 4.0 K/uL   Monocytes Relative 5 %   Monocytes Absolute 2.0 (H) 0.1 - 1.0 K/uL   Eosinophils Relative 0 %   Eosinophils Absolute 0.0 0.0 - 0.5 K/uL   Basophils Relative 0 %   Basophils Absolute 0.0 0.0 - 0.1 K/uL   nRBC 0 0 /100 WBC   Abs Immature Granulocytes 0.00 0.00 - 0.07 K/uL   Burr Cells PRESENT     Comment: Performed at [facility], [address] [place], [place] [address]  Protime-INR     Status: Abnormal   Collection Time: 06/22/22  8:00 PM  Result Value Ref Range   Prothrombin Time 19.9 (H) 11.4 - 15.2 seconds   INR 1.7 (H) 0.8 - 1.2    Comment: (NOTE) INR goal varies based on device and disease states. Performed at [facility], [address] [place], [place] [address]   APTT     Status: Abnormal   Collection Time: 06/22/22  8:00 PM  Result Value Ref Range   aPTT 38 (H) 24 - 36 seconds    Comment:        IF BASELINE aPTT IS ELEVATED, SUGGEST PATIENT RISK ASSESSMENT BE USED TO DETERMINE APPROPRIATE ANTICOAGULANT THERAPY. Performed at [facility], [address] [place], [place] [address]   Blood Culture (routine x 2)     Status: None (Preliminary result)   Collection Time: 06/22/22  8:00 PM   Specimen: BLOOD  Result Value Ref Range   Specimen Description BLOOD LEFT ANTECUBITAL    Special Requests      BOTTLES DRAWN AEROBIC AND ANAEROBIC Blood Culture results may not be optimal due to an excessive volume of blood received in culture bottles   Culture      NO GROWTH < 12 HOURS Performed at [facility], [address] [place], [place] [address]    Report Status PENDING   Blood  Culture (routine x 2)  Status: None (Preliminary result)   Collection Time: 06/22/22  8:10 PM   Specimen: BLOOD  Result Value Ref Range   Specimen Description BLOOD RIGHT ANTECUBITAL    Special Requests      BOTTLES DRAWN AEROBIC AND ANAEROBIC Blood Culture adequate volume   Culture      NO GROWTH < 12 HOURS Performed at [facility], [address] [place], [place] [address]    Report Status PENDING   HIV Antibody (routine testing w rflx)     Status: None   Collection Time: 06/22/22 11:28 PM  Result Value Ref Range   HIV Screen 4th Generation wRfx Non Reactive Non Reactive    Comment: Performed at [facility], [address], [place], [place] [address]  Urinalysis, Routine w reflex microscopic Urine, Clean Catch     Status: Abnormal   Collection Time: 06/23/22 12:12 AM  Result Value Ref Range   Color, Urine YELLOW YELLOW   APPearance HAZY (A) CLEAR   Specific Gravity, Urine 1.011 1.005 - 1.030   pH 5.0 5.0 - 8.0   Glucose, UA 50 (A) NEGATIVE mg/dL   Hgb urine dipstick MODERATE (A) NEGATIVE   Bilirubin Urine NEGATIVE NEGATIVE   Ketones, ur NEGATIVE NEGATIVE mg/dL   Protein, ur 30 (A) NEGATIVE mg/dL   Nitrite NEGATIVE NEGATIVE   Leukocytes,Ua NEGATIVE NEGATIVE   RBC / HPF 0-5 0 - 5 RBC/hpf   WBC, UA 0-5 0 - 5 WBC/hpf   Bacteria, UA RARE (A) NONE SEEN   Squamous Epithelial / LPF 0-5 0 - 5   Mucus PRESENT    Amorphous Crystal PRESENT     Comment: Performed at [facility], [address], [place], [place] [address]  CBG monitoring, ED     Status: Abnormal   Collection Time: 06/23/22 12:17 AM  Result Value Ref Range   Glucose-Capillary 223 (H) 70 - 99 mg/dL    Comment: Glucose reference range applies only to samples taken after fasting for at least 8 hours.  Lactic acid, plasma     Status: None   Collection Time: 06/23/22  1:14 AM  Result Value Ref Range   Lactic Acid, Venous 1.7 0.5 - 1.9 mmol/L    Comment: Performed at [facility], [address], [place], [place] [address]  Glucose, capillary     Status: Abnormal   Collection Time: 06/23/22  2:41 AM  Result Value Ref Range   Glucose-Capillary 195 (H) 70 - 99 mg/dL    Comment: Glucose reference range applies only to samples taken after fasting for at least 8 hours.  Protime-INR     Status: Abnormal   Collection Time: 06/23/22  4:31 AM  Result Value Ref Range   Prothrombin Time 18.5 (H) 11.4 - 15.2 seconds   INR 1.6 (H) 0.8 - 1.2    Comment: (NOTE) INR goal varies based on device and disease states. Performed at [facility], [address] [place], [place] [address]   Cortisol-am, blood     Status: None   Collection Time: 06/23/22  4:31 AM  Result Value Ref Range   Cortisol - AM 21.9 6.7 - 22.6 ug/dL    Comment: Performed at [facility], [address], [place], [place] [address]  Procalcitonin     Status: None   Collection Time: 06/23/22  4:31 AM  Result Value Ref Range   Procalcitonin 48.33 ng/mL    Comment:  Interpretation: PCT >= 10 ng/mL: Important systemic inflammatory response, almost exclusively due to severe bacterial sepsis or septic shock. (NOTE)       Sepsis PCT Algorithm           Lower Respiratory Tract                                      Infection PCT Algorithm    ----------------------------     ----------------------------         PCT < 0.25 ng/mL                PCT < 0.10 ng/mL          Strongly encourage             Strongly discourage   discontinuation of antibiotics    initiation of antibiotics    ----------------------------     -----------------------------       PCT 0.25 - 0.50 ng/mL            PCT 0.10 - 0.25 ng/mL               OR       >80% decrease in PCT            Discourage initiation of                                            antibiotics      Encourage discontinuation           of antibiotics    ----------------------------     -----------------------------         PCT >= 0.50 ng/mL              PCT  0.26 - 0.50 ng/mL                AND       <80% decrease in PCT             Encourage initiation of                                             antibiotics       Encourage continuation           of antibiotics    ----------------------------     -----------------------------        PCT >= 0.50 ng/mL                  PCT > 0.50 ng/mL               AND         increase in PCT                  Strongly encourage                                      initiation of antibiotics    Strongly encourage escalation           of antibiotics                                     -----------------------------  PCT <= 0.25 ng/mL                                                 OR                                        > 80% decrease in PCT                                      Discontinue / Do not initiate                                             antibiotics  Performed at [facility], [address] [place], [place] [address]   Comprehensive metabolic panel     Status: Abnormal   Collection Time: 06/23/22  4:31 AM  Result Value Ref Range   Sodium 136 135 - 145 mmol/L   Potassium 3.5 3.5 - 5.1 mmol/L   Chloride 109 98 - 111 mmol/L   CO2 15 (L) 22 - 32 mmol/L   Glucose, Bld 203 (H) 70 - 99 mg/dL    Comment: Glucose reference range applies only to samples taken after fasting for at least 8 hours.   BUN 80 (H) 6 - 20 mg/dL   Creatinine, Ser 7.06 (H) 0.61 - 1.24 mg/dL   Calcium 8.3 (L) 8.9 - 10.3 mg/dL   Total Protein 6.3 (L) 6.5 - 8.1 g/dL   Albumin 1.7 (L) 3.5 - 5.0 g/dL   AST 237 (H) 15 - 41 U/L   ALT 76 (H) 0 - 44 U/L   Alkaline Phosphatase 156 (H) 38 - 126 U/L   Total Bilirubin 1.3 (H) 0.3 - 1.2 mg/dL   GFR, Estimated 18 (L) >60 mL/min    Comment: (NOTE) Calculated using the CKD-EPI Creatinine Equation (2021)    Anion gap 12 5 - 15    Comment: Performed at [facility], [address], [place], [place] [address]  CBC     Status:  Abnormal   Collection Time: 06/23/22  4:31 AM  Result Value Ref Range   WBC 36.8 (H) 4.0 - 10.5 K/uL   RBC 3.40 (L) 4.22 - 5.81 MIL/uL   Hemoglobin 11.9 (L) 13.0 - 17.0 g/dL   HCT 51.7 (L) 61.6 - 07.3 %   MCV 94.1 80.0 - 100.0 fL   MCH 35.0 (H) 26.0 - 34.0 pg   MCHC 37.2 (H) 30.0 - 36.0 g/dL   RDW 71.0 62.6 - 94.8 %   Platelets 209 150 - 400 K/uL   nRBC 0.1 0.0 - 0.2 %    Comment: Performed at [facility], [address] [place], [place] [address]  Glucose, capillary     Status: Abnormal   Collection Time: 06/23/22  5:20 AM  Result Value Ref Range   Glucose-Capillary 192 (H) 70 - 99 mg/dL    Comment: Glucose reference range applies only to samples taken after fasting for at least 8 hours.  Glucose, capillary     Status: Abnormal  Collection Time: 06/23/22  8:21 AM  Result Value Ref Range   Glucose-Capillary 154 (H) 70 - 99 mg/dL    Comment: Glucose reference range applies only to samples taken after fasting for at least 8 hours.    US RENAL  Result Date: 06/23/2022 CLINICAL DATA:  Acute renal injury EXAM: RENAL / URINARY TRACT ULTRASOUND COMPLETE COMPARISON:  CT from 02/09/2021 FINDINGS: Right Kidney: Renal measurements: 14.7 x 7.4 x 7.3 cm. = volume: 416 mL. Echogenicity within normal limits. No mass or hydronephrosis visualized. Left Kidney: Dysplastic left kidney is not visualized. Bladder: Appears normal for degree of bladder distention. Other: None. 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 No acute abnormality noted. Electronically Signed   By: Alcide Clever M.D.   On: 06/23/2022 01:07   DG Chest Port 1 View  Result Date: 06/22/2022 CLINICAL DATA:  Questionable sepsis.  Altered mental status. EXAM: PORTABLE CHEST 1 VIEW COMPARISON:  Portable chest 04/14/2021 FINDINGS: The cardiac size is normal. No vascular congestion is seen. There is a normal mediastinal configuration. Again noted is prominent elevation of the right hemidiaphragm with overlying  linear atelectasis. This obscures the right lower lung field the visualized lungs are clear. The sulci are sharp. Thoracic spondylosis. IMPRESSION: The visualized lungs are clear, but with obscuration of the right lower lung field due to a chronically elevated right hemidiaphragm. The cardiac size is normal. Electronically Signed   By: Almira Bar M.D.   On: 06/22/2022 21:36    ROS positive for neuropathy diabetes feeling poorly history of alcohol use no current abdominal pain or shortness of breath no chest pain.  All other systems noncontributory to HPI.  Positive for type 2 diabetes noncompliant. Blood pressure 127/70, pulse 85, temperature (!) 97.3 F (36.3 C), temperature source Oral, resp. rate 20, height 6' (1.829 m), weight 107.5 kg, SpO2 93 %. Physical Exam HENT:     Head: Normocephalic.     Right Ear: External ear normal.     Left Ear: External ear normal.     Nose: Nose normal.  Eyes:     Extraocular Movements: Extraocular movements intact.  Cardiovascular:     Rate and Rhythm: Normal rate.     Pulses: Normal pulses.  Pulmonary:     Effort: Pulmonary effort is normal.  Abdominal:     Palpations: Abdomen is soft.  Neurological:     Mental Status: He is alert.   Patient with left great toe tip necrosis with dry gangrene at the tip.  Minimal change from photographs in the chart previous admission.  Patient has diffuse swelling right foot and plantar ulcer that palpates deep without acute cellulitis.  Assessment/Plan: Difficult problem with noncompliant diabetic no presents with acute renal injury elevated creatinine, dehydration, sepsis with high white count.  He states is not able to stick himself with a needle in his stomach.  MRI scan of his right foot 2 months ago 04/14/2021 showed plantar ulceration with subcutaneous tissue scattered gas subcutaneous tissue edema without definite drainable abscess.  Continue IV antibiotics new MRI scan pending for comparison.  Obviously  control of his diabetes will have to be obtained in order to improve his right or left foot.  Long discussion with patient's importance of staying in the hospital being compliant getting IV antibiotics.  We will follow-up in a.m. to review MRI scan.  Discussion about importance of taking his insulin otherwise he may end up on dialysis.  Potential for amputation discussed as well.  Jesse [name]  Jesse [name] 06/23/2022, 9:25 AM

## 2022-06-23 NOTE — ED Notes (Signed)
Please call patient's sister Allen Egerton at [phone] or [name] (# under contacts) with patient updates.

## 2022-06-23 NOTE — Progress Notes (Signed)
Pharmacy Antibiotic Note  Jesse [name] is a 61 y.o. male admitted on 06/22/2022 with  wound infection .  Pharmacy has been consulted for Vancomycin/Cefepime dosing. WBC is markedly elevated. Noted AKI (4.78>>4.19>>3.63).   Plan: Vancomycin 2000 mg IV x 1 >>>Further dosing based on Scr trend Cefepime 2g IV q24h Trend WBC, temp, renal function  F/U infectious work-up Drug levels as indicated    Height: 6' (182.9 cm) Weight: 107.5 kg (236 lb 15.9 oz) IBW/kg (Calculated) : 77.6  Temp (24hrs), Avg:98 F (36.7 C), Min:97.8 F (36.6 C), Max:98.2 F (36.8 C)  Recent Labs  Lab 06/20/22 1519 06/20/22 1520 06/20/22 1941 06/22/22 2000 06/23/22 0114 06/23/22 0431  WBC 28.3*  --   --  40.6*  --  36.8*  CREATININE 4.78*  --   --  4.19*  --  3.63*  LATICACIDVEN  --  3.1* 2.2* 2.0* 1.7  --     Estimated Creatinine Clearance: 27.8 mL/min (A) (by C-G formula based on SCr of 3.63 mg/dL (H)).    No Known Allergies  Abran Duke, PharmD, BCPS Clinical Pharmacist Phone: [phone]

## 2022-06-23 NOTE — Progress Notes (Signed)
Pt c/o difficulty urinating. Bladder scan results 218. I&O cath results 600. MD made aware. Orders for Foley cath. Pt declined Foley, wanting to keep the one 'that stays on the outside'. MD made aware. Primofit replaced.

## 2022-06-23 NOTE — ED Notes (Addendum)
This RN and Maurice March, EMT provided peri-care, changed brief, provided clean linens prior to moving to inpatient floor. Moisturizing barrier cream applied to affected areas.

## 2022-06-23 NOTE — Progress Notes (Signed)
PROGRESS NOTE    Jesse [name]  WFU:[id number] DOB: Dec 20, 1960 DOA: 06/22/2022 PCP: Oneita Hurt, No    Brief Narrative:   Jesse [name] is a 61 y.o. male with medical history significant of diabetes, infections, hypertension, hyperlipidemia, ethanol use presenting with altered mental status  History obtained assistance of family and chart review.  Patient stepped on a nail around 6 months ago and at this point has a "thumb sized hole "in right foot.  He also is injured his left foot unknown as to when and how.  On EMS arrival he had been incontinent of stool and was in his stool.  Received a liter of fluids in route.   Of note, patient presented to the ED 2 days ago for his wounds and nausea vomiting.  Full note not yet available but upon review of records patient noted to have imaging showing right foot cellulitis and foreign body as well as left foot possible osteomyelitis.  Plan was for cefepime and admission but it appears patient left AMA.   Patient altered for EMS, and on my evaluation room and somewhat confused but is answering actions of appropriately and follows commands.  Family confirms patient remains altered   8/13 MRI  rt foot today 1. 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 2. Patchy areas of marrow edema within the midfoot without focal erosion which could represent reactive osteitis or stress-related changes. 3. Plantar soft tissue ulceration underlying the midfoot. 4. Marked dorsal subcutaneous edema and likely cellulitis. No organized fluid collections. 5. Findings of diffuse myositis and/or denervation.   ID consulted.   Consultants:  Orthopedics  Procedures: MR   Antimicrobials:  Cefepime, vancomycin and metronidazole   Subjective: Sleepy, confused denies shortness of breath or pain  Objective: Vitals:   06/23/22 0130 06/23/22 0229 06/23/22 0500 06/23/22 0823  BP:  101/63 122/79 (!) 143/85 127/70  Pulse: 83 90 88 85  Resp: (!) 27 19 18 20   Temp:  98.2 F (36.8 C) 98.1 F (36.7 C) (!) 97.3 F (36.3 C)  TempSrc:  Oral Oral Oral  SpO2: 96% 96% 99% 93%  Weight:   107.5 kg   Height:   6' (1.829 m)     Intake/Output Summary (Last 24 hours) at 06/23/2022 1055 Last data filed at 06/23/2022 0815 Gross per 24 hour  Intake 2100 ml  Output 1200 ml  Net 900 ml   Filed Weights   06/23/22 0500  Weight: 107.5 kg    Examination:  Calm, NAD, sleepy Cta no w/r Reg s1/s2 no gallop Soft benign +bs _LE edema.  Confused, sleepy.  Mood and affect appropriate in current setting     Data Reviewed: I have personally reviewed following labs and imaging studies  CBC: Recent Labs  Lab 06/20/22 1519 06/22/22 2000 06/23/22 0431  WBC 28.3* 40.6* 36.8*  NEUTROABS 22.0* 38.2*  --   HGB 13.4 12.1* 11.9*  HCT 38.2* 34.4* 32.0*  MCV 99.0 98.0 94.1  PLT 163 205 209   Basic Metabolic Panel: Recent Labs  Lab 06/20/22 1519 06/22/22 2000 06/23/22 0431  NA 132* 133* 136  K 3.6 3.6 3.5  CL 101 107 109  CO2 15* 13* 15*  GLUCOSE 221* 267* 203*  BUN 58* 84* 80*  CREATININE 4.78* 4.19* 3.63*  CALCIUM 8.4* 8.3* 8.3*   GFR: Estimated Creatinine Clearance: 27.8 mL/min (A) (by C-G formula based on SCr of 3.63 mg/dL (H)). Liver Function Tests: Recent  Labs  Lab 06/20/22 1519 06/22/22 2000 06/23/22 0431  AST 89* 110* 106*  ALT 46* 75* 76*  ALKPHOS 94 157* 156*  BILITOT 1.3* 1.5* 1.3*  PROT 7.4 6.9 6.3*  ALBUMIN 2.5* 2.0* 1.7*   No results for input(s): "LIPASE", "AMYLASE" in the last 168 hours. No results for input(s): "AMMONIA" in the last 168 hours. Coagulation Profile: Recent Labs  Lab 06/22/22 2000 06/23/22 0431  INR 1.7* 1.6*   Cardiac Enzymes: No results for input(s): "CKTOTAL", "CKMB", "CKMBINDEX", "TROPONINI" in the last 168 hours. BNP (last 3 results) No results for input(s): "PROBNP" in the last 8760 hours. HbA1C: Recent Labs     06/20/22 1519  HGBA1C 8.3*   CBG: Recent Labs  Lab 06/23/22 0017 06/23/22 0241 06/23/22 0520 06/23/22 0821  GLUCAP 223* 195* 192* 154*   Lipid Profile: No results for input(s): "CHOL", "HDL", "LDLCALC", "TRIG", "CHOLHDL", "LDLDIRECT" in the last 72 hours. Thyroid Function Tests: No results for input(s): "TSH", "T4TOTAL", "FREET4", "T3FREE", "THYROIDAB" in the last 72 hours. Anemia Panel: No results for input(s): "VITAMINB12", "FOLATE", "FERRITIN", "TIBC", "IRON", "RETICCTPCT" in the last 72 hours. Sepsis Labs: Recent Labs  Lab 06/20/22 1520 06/20/22 1941 06/22/22 2000 06/23/22 0114 06/23/22 0431  PROCALCITON  --   --   --   --  48.33  LATICACIDVEN 3.1* 2.2* 2.0* 1.7  --     Recent Results (from the past 240 hour(s))  Blood culture (routine x 2)     Status: None (Preliminary result)   Collection Time: 06/20/22  3:25 PM   Specimen: BLOOD  Result Value Ref Range Status   Specimen Description BLOOD SITE NOT SPECIFIED  Final   Special Requests   Final    BOTTLES DRAWN AEROBIC AND ANAEROBIC Blood Culture results may not be optimal due to an excessive volume of blood received in culture bottles   Culture   Final    NO GROWTH 3 DAYS Performed at [facility], [address] [place], [place] [address]    Report Status PENDING  Incomplete  Blood culture (routine x 2)     Status: Abnormal (Preliminary result)   Collection Time: 06/20/22  4:20 PM   Specimen: BLOOD RIGHT HAND  Result Value Ref Range Status   Specimen Description BLOOD RIGHT HAND  Final   Special Requests   Final    BOTTLES DRAWN AEROBIC ONLY Blood Culture results may not be optimal due to an inadequate volume of blood received in culture bottles   Culture  Setup Time (A)  Final    GRAM VARIABLE ROD AEROBIC BOTTLE ONLY CRITICAL RESULT CALLED TO, READ BACK BY AND VERIFIED WITH: K. MOON RN, AT [phone] 06/22/22 D. VANHOOK Performed at [facility], [address], [place], [place] [address]    Culture  GRAM VARIABLE ROD (A)  Final   Report Status PENDING  Incomplete  Blood Culture ID Panel (Reflexed)     Status: None   Collection Time: 06/20/22  4:20 PM  Result Value Ref Range Status   Enterococcus faecalis NOT DETECTED NOT DETECTED Final   Enterococcus Faecium NOT DETECTED NOT DETECTED Final   Listeria monocytogenes NOT DETECTED NOT DETECTED Final   Staphylococcus species NOT DETECTED NOT DETECTED Final   Staphylococcus aureus (BCID) NOT DETECTED NOT DETECTED Final   Staphylococcus epidermidis NOT DETECTED NOT DETECTED Final   Staphylococcus lugdunensis NOT DETECTED NOT DETECTED Final   Streptococcus species NOT DETECTED NOT DETECTED Final   Streptococcus agalactiae NOT DETECTED NOT  DETECTED Final   Streptococcus pneumoniae NOT DETECTED NOT DETECTED Final   Streptococcus pyogenes NOT DETECTED NOT DETECTED Final   A.calcoaceticus-baumannii NOT DETECTED NOT DETECTED Final   Bacteroides fragilis NOT DETECTED NOT DETECTED Final   Enterobacterales NOT DETECTED NOT DETECTED Final   Enterobacter cloacae complex NOT DETECTED NOT DETECTED Final   Escherichia coli NOT DETECTED NOT DETECTED Final   Klebsiella aerogenes NOT DETECTED NOT DETECTED Final   Klebsiella oxytoca NOT DETECTED NOT DETECTED Final   Klebsiella pneumoniae NOT DETECTED NOT DETECTED Final   Proteus species NOT DETECTED NOT DETECTED Final   Salmonella species NOT DETECTED NOT DETECTED Final   Serratia marcescens NOT DETECTED NOT DETECTED Final   Haemophilus influenzae NOT DETECTED NOT DETECTED Final   Neisseria meningitidis NOT DETECTED NOT DETECTED Final   Pseudomonas aeruginosa NOT DETECTED NOT DETECTED Final   Stenotrophomonas maltophilia NOT DETECTED NOT DETECTED Final   Candida albicans NOT DETECTED NOT DETECTED Final   Candida auris NOT DETECTED NOT DETECTED Final   Candida glabrata NOT DETECTED NOT DETECTED Final   Candida krusei NOT DETECTED NOT DETECTED Final   Candida parapsilosis NOT DETECTED NOT DETECTED Final    Candida tropicalis NOT DETECTED NOT DETECTED Final   Cryptococcus neoformans/gattii NOT DETECTED NOT DETECTED Final    Comment: Performed at [facility], [address], [place], [place] [address]  Blood Culture (routine x 2)     Status: None (Preliminary result)   Collection Time: 06/22/22  8:00 PM   Specimen: BLOOD  Result Value Ref Range Status   Specimen Description BLOOD LEFT ANTECUBITAL  Final   Special Requests   Final    BOTTLES DRAWN AEROBIC AND ANAEROBIC Blood Culture results may not be optimal due to an excessive volume of blood received in culture bottles   Culture   Final    NO GROWTH < 12 HOURS Performed at [facility], [address] [place], [place] [address]    Report Status PENDING  Incomplete  Blood Culture (routine x 2)     Status: None (Preliminary result)   Collection Time: 06/22/22  8:10 PM   Specimen: BLOOD  Result Value Ref Range Status   Specimen Description BLOOD RIGHT ANTECUBITAL  Final   Special Requests   Final    BOTTLES DRAWN AEROBIC AND ANAEROBIC Blood Culture adequate volume   Culture   Final    NO GROWTH < 12 HOURS Performed at [facility], [address], [place], [place] [address]    Report Status PENDING  Incomplete         Radiology Studies: US RENAL  Result Date: 06/23/2022 CLINICAL DATA:  Acute renal injury EXAM: RENAL / URINARY TRACT ULTRASOUND COMPLETE COMPARISON:  CT from 02/09/2021 FINDINGS: Right Kidney: Renal measurements: 14.7 x 7.4 x 7.3 cm. = volume: 416 mL. Echogenicity within normal limits. No mass or hydronephrosis visualized. Left Kidney: Dysplastic left kidney is not visualized. Bladder: Appears normal for degree of bladder distention. Other: None. 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 No acute abnormality noted. Electronically Signed   By: Alcide Clever M.D.   On: 06/23/2022 01:07   DG Chest Port 1 View  Result Date: 06/22/2022 CLINICAL DATA:  Questionable sepsis.   Altered mental status. EXAM: PORTABLE CHEST 1 VIEW COMPARISON:  Portable chest 04/14/2021 FINDINGS: The cardiac size is normal. No vascular congestion is seen. There is a normal mediastinal configuration. Again noted is prominent elevation of the right hemidiaphragm with overlying  linear atelectasis. This obscures the right lower lung field the visualized lungs are clear. The sulci are sharp. Thoracic spondylosis. IMPRESSION: The visualized lungs are clear, but with obscuration of the right lower lung field due to a chronically elevated right hemidiaphragm. The cardiac size is normal. Electronically Signed   By: Almira Bar M.D.   On: 06/22/2022 21:36        Scheduled Meds:  atorvastatin  40 mg Oral Daily   insulin aspart  0-15 Units Subcutaneous Q4H   sodium chloride flush  3 mL Intravenous Q12H   vancomycin variable dose per unstable renal function (pharmacist dosing)   Does not apply See admin instructions   Continuous Infusions:  ceFEPime (MAXIPIME) IV     lactated ringers 125 mL/hr at 06/23/22 0813   metronidazole Stopped (06/23/22 0007)    Assessment & Plan:   Principal Problem:   Sepsis (HCC) Active Problems:   DM (diabetes mellitus) (HCC)   AKI (acute kidney injury) (HCC)   HTN (hypertension)   HLD (hyperlipidemia)   Osteomyelitis (HCC)   Cellulitis in diabetic foot (HCC)   Diabetic foot infection (HCC)   Sepsis Diabetic foot infection Cellulitis Osteomyelitis Foreign body Altered mental status > Patient presenting with altered mental status in the setting of known diabetic foot wounds  > Seen 2 days ago in the ED but left AMA.  At that time imaging noted to have cellulitis and foreign body of the right foot and suspected osteomyelitis of the left foot.  Known wounds in both feet. 8/13 still confused. 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 after reviewing MRI     AKI > Creatinine bili elevated to 4.19 which is somewhat improved from 2 days ago at 4.7.  However unclear baseline as last labs were a year ago but at that time his creatinine ranged from 1.1-1.6. > Family reports history of solitary kidney unclear what side.  Some distended abdomen. > Unclear if there is any degree of underlying CKD. > Possibly related to decreased p.o. intake and sepsis as above. > Received 3 L thus far in the ED and with EMS. 8/13 renal ultrasound without any acute abnormality Avoid nephrotoxic meds Check postvoid bladder scan Continue IV fluids for hydration If it does not improve can consult nephrology   Diabetes BG overall stable continue to monitor R-ISS   Hypertension Hold bp meds for now due to infection and bp was on lower side On ivf    Hyperlipidemia Hold statin due to elevated LFT for now     DVT prophylaxis:  heparin Code Status: Full Family Communication: None at bedside Disposition Plan: Home Status is: Inpatient Remains inpatient appropriate because: IV treatment.  Work-up pending        LOS: 1 day   Time spent: 52 min    Lynn Ito, MD [facility] Pager [phone]  If 7PM-7AM, please contact night-coverage 06/23/2022, 10:55 AM

## 2022-06-23 NOTE — Evaluation (Addendum)
Patient made aware of pending orders for chest CXR. Refused once transport at bedside. Beryle Quant., off-going RN stated MD made aware of patient reluctance to comply with temporary NPO order. No changes to diet order at start of shift. House tray at bedside  untouched.Unaware of additional intake up to this point.  Adb [place] also scheduled. Transport at bedside. Patient agreeable to transport. Charge RN, Minerva Areola made aware of incident.

## 2022-06-24 ENCOUNTER — Inpatient Hospital Stay (HOSPITAL_COMMUNITY): Payer: 59

## 2022-06-24 DIAGNOSIS — I70229 Atherosclerosis of native arteries of extremities with rest pain, unspecified extremity: Secondary | ICD-10-CM

## 2022-06-24 LAB — CBC
HCT: 33.5 % — ABNORMAL LOW (ref 39.0–52.0)
Hemoglobin: 12 g/dL — ABNORMAL LOW (ref 13.0–17.0)
MCH: 34.7 pg — ABNORMAL HIGH (ref 26.0–34.0)
MCHC: 35.8 g/dL (ref 30.0–36.0)
MCV: 96.8 fL (ref 80.0–100.0)
Platelets: 298 10*3/uL (ref 150–400)
RBC: 3.46 MIL/uL — ABNORMAL LOW (ref 4.22–5.81)
RDW: 14.5 % (ref 11.5–15.5)
WBC: 38.7 10*3/uL — ABNORMAL HIGH (ref 4.0–10.5)
nRBC: 0.1 % (ref 0.0–0.2)

## 2022-06-24 LAB — GLUCOSE, CAPILLARY
Glucose-Capillary: 196 mg/dL — ABNORMAL HIGH (ref 70–99)
Glucose-Capillary: 153 mg/dL — ABNORMAL HIGH (ref 70–99)
Glucose-Capillary: 180 mg/dL — ABNORMAL HIGH (ref 70–99)
Glucose-Capillary: 179 mg/dL — ABNORMAL HIGH (ref 70–99)
Glucose-Capillary: 111 mg/dL — ABNORMAL HIGH (ref 70–99)
Glucose-Capillary: 121 mg/dL — ABNORMAL HIGH (ref 70–99)

## 2022-06-24 LAB — URINE CULTURE: Culture: <10000 — AB

## 2022-06-24 LAB — BASIC METABOLIC PANEL
Anion gap: 10 (ref 5–15)
BUN: 62 mg/dL — ABNORMAL HIGH (ref 6–20)
CO2: 18 mmol/L — ABNORMAL LOW (ref 22–32)
Calcium: 8.7 mg/dL — ABNORMAL LOW (ref 8.9–10.3)
Chloride: 112 mmol/L — ABNORMAL HIGH (ref 98–111)
Creatinine, Ser: 2.48 mg/dL — ABNORMAL HIGH (ref 0.61–1.24)
GFR, Estimated: 29 mL/min — ABNORMAL LOW (ref >60)
Glucose, Bld: 174 mg/dL — ABNORMAL HIGH (ref 70–99)
Potassium: 3.4 mmol/L — ABNORMAL LOW (ref 3.5–5.1)
Sodium: 140 mmol/L (ref 135–145)

## 2022-06-24 LAB — HEPATITIS B SURFACE ANTIGEN: Hepatitis B Surface Ag: NONREACTIVE

## 2022-06-24 LAB — HEPATITIS C ANTIBODY: HCV Ab: NONREACTIVE

## 2022-06-24 LAB — HEPATITIS B CORE ANTIBODY, TOTAL: Hep B Core Total Ab: REACTIVE — AB

## 2022-06-24 MED ORDER — SODIUM CHLORIDE 0.9 % IV SOLN
800.0000 mg | Freq: Every day | INTRAVENOUS | Status: DC
  Administered 2022-06-24 – 2022-06-25 (×2): 800 mg via INTRAVENOUS
  Filled 2022-06-24 (×3): qty 16

## 2022-06-24 MED ORDER — SODIUM CHLORIDE 0.9 % IV SOLN
2.0000 g | Freq: Two times a day (BID) | INTRAVENOUS | Status: DC
  Administered 2022-06-24 – 2022-06-25 (×4): 2 g via INTRAVENOUS
  Filled 2022-06-24 (×4): qty 12.5

## 2022-06-24 MED ORDER — SENNOSIDES-DOCUSATE SODIUM 8.6-50 MG PO TABS
1.0000 | ORAL_TABLET | Freq: Every day | ORAL | Status: DC
  Administered 2022-06-24 – 2022-07-02 (×3): 1 via ORAL
  Filled 2022-06-24 (×6): qty 1

## 2022-06-24 MED ORDER — SODIUM CHLORIDE 0.9 % IV SOLN: INTRAVENOUS | Status: DC

## 2022-06-24 MED ORDER — INSULIN DETEMIR 100 UNIT/ML ~~LOC~~ SOLN
6.0000 [IU] | Freq: Every day | SUBCUTANEOUS | Status: DC
  Administered 2022-06-24 – 2022-07-03 (×10): 6 [IU] via SUBCUTANEOUS
  Filled 2022-06-24 (×10): qty 0.06

## 2022-06-24 MED ORDER — THIAMINE MONONITRATE 100 MG PO TABS
100.0000 mg | ORAL_TABLET | Freq: Every day | ORAL | Status: DC
  Administered 2022-06-24 – 2022-07-01 (×8): 100 mg via ORAL
  Filled 2022-06-24 (×17): qty 1

## 2022-06-24 MED ORDER — OXYCODONE HCL 5 MG PO TABS: 5.0000 mg | ORAL_TABLET | Freq: Four times a day (QID) | ORAL | Status: DC | PRN

## 2022-06-24 NOTE — Progress Notes (Signed)
PHARMACY ANTIBIOTIC CONSULT NOTE   Jesse [name] a 61 y.o. male admitted on 06/22/2022 with a wound infection. Patient found to have MRI findings c/w osteomyelitis of the second metatarsal phalangeal joint. Ortho recommending surgical intervention, but the patient does not consent at this time. Pharmacy has been consulted for daptomycin and cefepime dosing.  Scr 2.48, down from 3.63 om 8/13. In light of improving AKI, patient requires dose adjustment of both daptomycin and cefepime.  Estimated Creatinine Clearance: 40.6 mL/min (A) (by C-G formula based on SCr of 2.48 mg/dL (H)).  Plan: INCREASE Cefepime dosing frequency from Q24H>>Cefepime 2g IV Q12H  INCREASE Daptomycin dosing frequency from Q48H>>Daptomycin 800 mg Q24H  Continue to monitor renal function, surgical plans, ID recs, and DOT   Allergies:  No Known Allergies  Filed Weights   06/23/22 0500  Weight: 107.5 kg (236 lb 15.9 oz)       Latest Ref Rng & Units 06/24/2022    9:44 AM 06/23/2022    4:31 AM 06/22/2022    8:00 PM  CBC  WBC 4.0 - 10.5 K/uL PENDING  36.8  40.6   Hemoglobin 13.0 - 17.0 g/dL 96.2  22.9  79.8   Hematocrit 39.0 - 52.0 % 33.5  32.0  34.4   Platelets 150 - 400 K/uL 298  209  205     Antibiotics Given (last 72 hours)     Date/Time Action Medication Dose Rate   06/22/22 2251 New Bag/Given   metroNIDAZOLE (FLAGYL) IVPB 500 mg 500 mg 100 mL/hr   06/23/22 0030 New Bag/Given   vancomycin (VANCOREADY) IVPB 2000 mg/400 mL 2,000 mg 200 mL/hr   06/23/22 0031 New Bag/Given   ceFEPIme (MAXIPIME) 2 g in sodium chloride 0.9 % 100 mL IVPB 2 g 200 mL/hr   06/23/22 1059 New Bag/Given   metroNIDAZOLE (FLAGYL) IVPB 500 mg 500 mg 100 mL/hr   06/23/22 2052 New Bag/Given   DAPTOmycin (CUBICIN) 800 mg in sodium chloride 0.9 % IVPB 800 mg 132 mL/hr   06/23/22 2230 New Bag/Given   ceFEPIme (MAXIPIME) 2 g in sodium chloride 0.9 % 100 mL IVPB 2 g 200 mL/hr   06/24/22 0000 New Bag/Given   metroNIDAZOLE (FLAGYL) IVPB 500  mg 500 mg 100 mL/hr   06/24/22 1134 New Bag/Given   metroNIDAZOLE (FLAGYL) IVPB 500 mg 500 mg 100 mL/hr       Antimicrobials this admission: Daptomycin 8/13 >>  Cefepime 8/13 >> Vanc 8/13 x1 Flagyl 8/12 >> (8/19)  Microbiology results: 8/12 Bcx: ngtd x2d 8/12 Ucx: insignificant growth  Thank you for allowing pharmacy to be a part of this patient's care.  Jani Gravel, PharmD PGY-2 Infectious Diseases Resident  06/24/2022 11:36 AM

## 2022-06-24 NOTE — Plan of Care (Signed)
  Problem: Respiratory: Goal: Ability to maintain adequate ventilation will improve Outcome: Progressing   Problem: Education: Goal: Knowledge of General Education information will improve Description: Including pain rating scale, medication(s)/side effects and non-pharmacologic comfort measures Outcome: Progressing   Problem: Activity: Goal: Risk for activity intolerance will decrease Outcome: Progressing   Problem: Pain Managment: Goal: General experience of comfort will improve Outcome: Progressing   Problem: Safety: Goal: Ability to remain free from injury will improve Outcome: Progressing   Problem: Skin Integrity: Goal: Risk for impaired skin integrity will decrease Outcome: Progressing

## 2022-06-24 NOTE — Progress Notes (Signed)
ABI's have been completed. Preliminary results can be found in CV Proc through chart review.   06/24/22 1:34 PM Jesse [name] RVT

## 2022-06-24 NOTE — Progress Notes (Addendum)
PROGRESS NOTE    Jesse [name]  ENI:[id number] DOB: Dec 20, 1960 DOA: 06/22/2022 PCP: Pcp, No  61/M with history of type 2 diabetes mellitus, noncompliance, ethanol use, hypertension and dyslipidemia presented to the ED with altered mental status. -He was found to have severe sepsis with diabetic foot ulcers, osteomyelitis, cellulitis and myositis as well as AKI.  Seen in the ED few days prior for same, left AMA then. -ID and orthopedics consulting  Subjective: -Tired, no other complaints  Assessment and Plan:  Severe sepsis, diabetic foot infection Right foot second TMT septic arthritis, osteomyelitis of second metatarsal  Metallic staple on x-ray concerning for foreign body Associated cellulitis and myositis -Appreciate infectious disease input, continue daptomycin and cefepime -Blood cultures are negative X 2 days -Orthopedics following, seen by Dr. Ophelia Charter, surgery recommended, patient wishes to discuss with Dr. Lajoyce Corners who will be back this week -check ABI -PT/OT eval  Acute metabolic encephalopathy -Secondary to severe sepsis and AKI, improving  Abnormal LFTs -Secondary to sepsis, will trend  Type 2 diabetes mellitus -CBGs in 150-190 range, A1c is 8.3 -Add Levemir  AKI on CKD3a -Admitted with creatinine of 4.7, baseline was 1.6, around 1 year ago -Secondary to sepsis, likely ATN, resolving -Add saline for 1 more day -Renal ultrasound without hydronephrosis, dysplastic left kidney  Alcohol use -No withdrawal noted, add thiamine  Hypertension -BP now stable, antihypertensives on hold  DVT prophylaxis: Heparin subcutaneous Code Status: Full code Family Communication: Discussed with patient in detail, no family at bedside Disposition Plan:   Consultants:    Procedures:   Antimicrobials:    Objective: Vitals:   06/23/22 1321 06/23/22 1944 06/24/22 0345 06/24/22 0730  BP: 127/75 (!) 148/80 (!) 105/53 (!) 154/81  Pulse: 91 90 90 83  Resp:  19 20   Temp: 97.7  F (36.5 C) 99 F (37.2 C) 98 F (36.7 C) 98.2 F (36.8 C)  TempSrc: Oral Oral Oral Oral  SpO2: 94% 93% 98% 98%  Weight:      Height:        Intake/Output Summary (Last 24 hours) at 06/24/2022 1152 Last data filed at 06/24/2022 6144 Gross per 24 hour  Intake 366 ml  Output 3750 ml  Net -3384 ml   Filed Weights   06/23/22 0500  Weight: 107.5 kg    Examination:  General exam: Chronically ill male lying in bed, AAO x2 CVS: S1-S2, regular rhythm Lungs: Clear bilaterally Abdomen: Soft, nontender, bowel sounds present Extremities: Left great toe tip with dry gangrene and necrotic changes, hard callus over right plantar surface, chronic skin changes on lower legs Skin: As above Psychiatry:  Mood & affect appropriate.     Data Reviewed:   CBC: Recent Labs  Lab 06/20/22 1519 06/22/22 2000 06/23/22 0431 06/24/22 0944  WBC 28.3* 40.6* 36.8* PENDING  NEUTROABS 22.0* 38.2*  --   --   HGB 13.4 12.1* 11.9* 12.0*  HCT 38.2* 34.4* 32.0* 33.5*  MCV 99.0 98.0 94.1 96.8  PLT 163 205 209 298   Basic Metabolic Panel: Recent Labs  Lab 06/20/22 1519 06/22/22 2000 06/23/22 0431 06/24/22 0944  NA 132* 133* 136 140  K 3.6 3.6 3.5 3.4*  CL 101 107 109 112*  CO2 15* 13* 15* 18*  GLUCOSE 221* 267* 203* 174*  BUN 58* 84* 80* 62*  CREATININE 4.78* 4.19* 3.63* 2.48*  CALCIUM 8.4* 8.3* 8.3* 8.7*   GFR: Estimated Creatinine Clearance: 40.6 mL/min (A) (by C-G formula based on SCr of 2.48 mg/dL (H)).  Liver Function Tests: Recent Labs  Lab 06/20/22 1519 06/22/22 2000 06/23/22 0431  AST 89* 110* 106*  ALT 46* 75* 76*  ALKPHOS 94 157* 156*  BILITOT 1.3* 1.5* 1.3*  PROT 7.4 6.9 6.3*  ALBUMIN 2.5* 2.0* 1.7*   No results for input(s): "LIPASE", "AMYLASE" in the last 168 hours. No results for input(s): "AMMONIA" in the last 168 hours. Coagulation Profile: Recent Labs  Lab 06/22/22 2000 06/23/22 0431  INR 1.7* 1.6*   Cardiac Enzymes: No results for input(s): "CKTOTAL",  "CKMB", "CKMBINDEX", "TROPONINI" in the last 168 hours. BNP (last 3 results) No results for input(s): "PROBNP" in the last 8760 hours. HbA1C: No results for input(s): "HGBA1C" in the last 72 hours. CBG: Recent Labs  Lab 06/23/22 1532 06/23/22 2047 06/24/22 0111 06/24/22 0428 06/24/22 0740  GLUCAP 130* 159* 196* 153* 180*   Lipid Profile: No results for input(s): "CHOL", "HDL", "LDLCALC", "TRIG", "CHOLHDL", "LDLDIRECT" in the last 72 hours. Thyroid Function Tests: No results for input(s): "TSH", "T4TOTAL", "FREET4", "T3FREE", "THYROIDAB" in the last 72 hours. Anemia Panel: No results for input(s): "VITAMINB12", "FOLATE", "FERRITIN", "TIBC", "IRON", "RETICCTPCT" in the last 72 hours. Urine analysis:    Component Value Date/Time   COLORURINE YELLOW 06/23/2022 0012   APPEARANCEUR HAZY (A) 06/23/2022 0012   LABSPEC 1.011 06/23/2022 0012   PHURINE 5.0 06/23/2022 0012   GLUCOSEU 50 (A) 06/23/2022 0012   HGBUR MODERATE (A) 06/23/2022 0012   BILIRUBINUR NEGATIVE 06/23/2022 0012   KETONESUR NEGATIVE 06/23/2022 0012   PROTEINUR 30 (A) 06/23/2022 0012   NITRITE NEGATIVE 06/23/2022 0012   LEUKOCYTESUR NEGATIVE 06/23/2022 0012   Sepsis Labs: @LABRCNTIP (procalcitonin:4,lacticidven:4)  ) Recent Results (from the past 240 hour(s))  Blood culture (routine x 2)     Status: None (Preliminary result)   Collection Time: 06/20/22  3:25 PM   Specimen: BLOOD  Result Value Ref Range Status   Specimen Description BLOOD SITE NOT SPECIFIED  Final   Special Requests   Final    BOTTLES DRAWN AEROBIC AND ANAEROBIC Blood Culture results may not be optimal due to an excessive volume of blood received in culture bottles   Culture   Final    NO GROWTH 4 DAYS Performed at [facility], [address] [place], [place]    Report Status PENDING  Incomplete  Blood culture (routine x 2)     Status: Abnormal (Preliminary result)   Collection Time: 06/20/22  4:20 PM   Specimen: BLOOD  RIGHT HAND  Result Value Ref Range Status   Specimen Description BLOOD RIGHT HAND  Final   Special Requests   Final    BOTTLES DRAWN AEROBIC ONLY Blood Culture results may not be optimal due to an inadequate volume of blood received in culture bottles   Culture  Setup Time (A)  Final    GRAM VARIABLE ROD AEROBIC BOTTLE ONLY CRITICAL RESULT CALLED TO, READ BACK BY AND VERIFIED WITH: K. MOON RN, AT [phone] 06/22/22 D. VANHOOK    Culture (A)  Final    GRAM VARIABLE ROD CULTURE REINCUBATED FOR BETTER GROWTH Performed at [facility], [address] [place], [place]    Report Status PENDING  Incomplete  Blood Culture ID Panel (Reflexed)     Status: None   Collection Time: 06/20/22  4:20 PM  Result Value Ref Range Status   Enterococcus faecalis NOT DETECTED NOT DETECTED Final   Enterococcus Faecium NOT DETECTED NOT DETECTED Final   Listeria monocytogenes NOT DETECTED  NOT DETECTED Final   Staphylococcus species NOT DETECTED NOT DETECTED Final   Staphylococcus aureus (BCID) NOT DETECTED NOT DETECTED Final   Staphylococcus epidermidis NOT DETECTED NOT DETECTED Final   Staphylococcus lugdunensis NOT DETECTED NOT DETECTED Final   Streptococcus species NOT DETECTED NOT DETECTED Final   Streptococcus agalactiae NOT DETECTED NOT DETECTED Final   Streptococcus pneumoniae NOT DETECTED NOT DETECTED Final   Streptococcus pyogenes NOT DETECTED NOT DETECTED Final   A.calcoaceticus-baumannii NOT DETECTED NOT DETECTED Final   Bacteroides fragilis NOT DETECTED NOT DETECTED Final   Enterobacterales NOT DETECTED NOT DETECTED Final   Enterobacter cloacae complex NOT DETECTED NOT DETECTED Final   Escherichia coli NOT DETECTED NOT DETECTED Final   Klebsiella aerogenes NOT DETECTED NOT DETECTED Final   Klebsiella oxytoca NOT DETECTED NOT DETECTED Final   Klebsiella pneumoniae NOT DETECTED NOT DETECTED Final   Proteus species NOT DETECTED NOT DETECTED Final   Salmonella species NOT DETECTED NOT  DETECTED Final   Serratia marcescens NOT DETECTED NOT DETECTED Final   Haemophilus influenzae NOT DETECTED NOT DETECTED Final   Neisseria meningitidis NOT DETECTED NOT DETECTED Final   Pseudomonas aeruginosa NOT DETECTED NOT DETECTED Final   Stenotrophomonas maltophilia NOT DETECTED NOT DETECTED Final   Candida albicans NOT DETECTED NOT DETECTED Final   Candida auris NOT DETECTED NOT DETECTED Final   Candida glabrata NOT DETECTED NOT DETECTED Final   Candida krusei NOT DETECTED NOT DETECTED Final   Candida parapsilosis NOT DETECTED NOT DETECTED Final   Candida tropicalis NOT DETECTED NOT DETECTED Final   Cryptococcus neoformans/gattii NOT DETECTED NOT DETECTED Final    Comment: Performed at [facility], [address] [place], [place] [address]  Blood Culture (routine x 2)     Status: None (Preliminary result)   Collection Time: 06/22/22  8:00 PM   Specimen: BLOOD  Result Value Ref Range Status   Specimen Description BLOOD LEFT ANTECUBITAL  Final   Special Requests   Final    BOTTLES DRAWN AEROBIC AND ANAEROBIC Blood Culture results may not be optimal due to an excessive volume of blood received in culture bottles   Culture   Final    NO GROWTH 2 DAYS Performed at [facility], [address] [place], [place] [address]    Report Status PENDING  Incomplete  Blood Culture (routine x 2)     Status: None (Preliminary result)   Collection Time: 06/22/22  8:10 PM   Specimen: BLOOD  Result Value Ref Range Status   Specimen Description BLOOD RIGHT ANTECUBITAL  Final   Special Requests   Final    BOTTLES DRAWN AEROBIC AND ANAEROBIC Blood Culture adequate volume   Culture   Final    NO GROWTH 2 DAYS Performed at [facility], [address], [place], [place] [address]    Report Status PENDING  Incomplete  Urine Culture     Status: Abnormal   Collection Time: 06/22/22  9:21 PM   Specimen: In/Out Cath Urine  Result Value Ref Range Status   Specimen Description  IN/OUT CATH URINE  Final   Special Requests NONE  Final   Culture (A)  Final    <10,000 COLONIES/mL INSIGNIFICANT GROWTH Performed at [facility], [address] [place], [place] [address]    Report Status 06/24/2022 FINAL  Final     Radiology Studies: US Abdomen Limited RUQ (LIVER/GB)  Result Date: 06/23/2022 CLINICAL DATA:  Transaminitis. EXAM: ULTRASOUND ABDOMEN LIMITED RIGHT UPPER QUADRANT  COMPARISON:  None Available. FINDINGS: Gallbladder: No gallstones or wall thickening visualized. Gallbladder sludge is present. No sonographic Murphy sign noted by sonographer. Common bile duct: Diameter: 3.3 mm Liver: No focal lesion identified. Increase in parenchymal echogenicity. Portal vein is patent on color Doppler imaging with normal direction of blood flow towards the liver. Other: None. IMPRESSION: 1. Gallbladder sludge. 2. No cholelithiasis. 3. Echogenic liver likely related to diffuse fatty infiltration. Electronically Signed   By: Darliss Cheney M.D.   On: 06/23/2022 20:39   MRI Right foot without contrast  Result Date: 06/23/2022 CLINICAL DATA:  Diabetic foot wounds EXAM: MRI OF THE RIGHT FOREFOOT WITHOUT CONTRAST TECHNIQUE: Multiplanar, multisequence MR imaging of the right forefoot was performed. No intravenous contrast was administered. COMPARISON:  X-ray 06/20/2022, MRI 04/14/2021 FINDINGS: Bones/Joint/Cartilage Small second tarsometatarsal joint effusion. Bone marrow edema within the second metatarsal base extending to the level of the distal diaphysis with confluent low T1 signal changes in the second metatarsal base (series 6, image 14). There is patchy bone marrow edema and intermediate T1 signal within the adjacent intermediate cuneiform. Findings are suspicious for septic arthritis with osteomyelitis. Patchy areas of marrow edema are also present within the navicular, medial and lateral cuneiform bones, and second metatarsal base. No focal erosion or confluent low T1 signal  changes at these locations to suggest acute osteomyelitis at this time. No acute fracture or dislocation. Susceptibility artifact between the second and third toes as well as between the third and fourth toes with resultant poor regional fat saturation. Ligaments Intact Lisfranc ligament.  Collateral ligaments appear intact. Muscles and Tendons Findings of myositis and/or denervation. No large tenosynovial fluid collection. Soft tissues Plantar soft tissue ulceration underlying the midfoot. Prominent dorsal subcutaneous edema. No organized fluid collection is evident on noncontrast imaging. Additional site of susceptibility artifact within the plantar soft tissues adjacent to the site of ulceration corresponding to surgical clips seen radiographically. IMPRESSION: 1. 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 2. Patchy areas of marrow edema within the midfoot without focal erosion which could represent reactive osteitis or stress-related changes. 3. Plantar soft tissue ulceration underlying the midfoot. 4. Marked dorsal subcutaneous edema and likely cellulitis. No organized fluid collections. 5. Findings of diffuse myositis and/or denervation. Electronically Signed   By: Duanne Guess D.O.   On: 06/23/2022 13:06   US RENAL  Result Date: 06/23/2022 CLINICAL DATA:  Acute renal injury EXAM: RENAL / URINARY TRACT ULTRASOUND COMPLETE COMPARISON:  CT from 02/09/2021 FINDINGS: Right Kidney: Renal measurements: 14.7 x 7.4 x 7.3 cm. = volume: 416 mL. Echogenicity within normal limits. No mass or hydronephrosis visualized. Left Kidney: Dysplastic left kidney is not visualized. Bladder: Appears normal for degree of bladder distention. Other: None. 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 No acute abnormality noted. Electronically Signed   By: Alcide Clever M.D.   On: 06/23/2022 01:07   DG Chest Port 1  View  Result Date: 06/22/2022 CLINICAL DATA:  Questionable sepsis.  Altered mental status. EXAM: PORTABLE CHEST 1 VIEW COMPARISON:  Portable chest 04/14/2021 FINDINGS: The cardiac size is normal. No vascular congestion is seen. There is a normal mediastinal configuration. Again noted is prominent elevation of the right hemidiaphragm with overlying linear atelectasis. This obscures the right lower lung field the visualized lungs are clear. The sulci are sharp. Thoracic spondylosis. IMPRESSION: The visualized lungs are clear, but with obscuration of the right lower lung field due to a  chronically elevated right hemidiaphragm. The cardiac size is normal. Electronically Signed   By: Almira BarKeith  Chesser M.D.   On: 06/22/2022 21:36     Scheduled Meds:  heparin injection (subcutaneous)  5,000 Units Subcutaneous Q8H   insulin aspart  0-15 Units Subcutaneous Q4H   sodium chloride flush  3 mL Intravenous Q12H   Continuous Infusions:  ceFEPime (MAXIPIME) IV     DAPTOmycin (CUBICIN) 800 mg in sodium chloride 0.9 % IVPB     metronidazole 500 mg (06/24/22 1134)     LOS: 2 days    Time spent: 35min  Zannie CovePreetha Daiton Cowles, MD [facility]   06/24/2022, 11:52 AM

## 2022-06-24 NOTE — Progress Notes (Signed)
Patient ID: Jesse [name], male   DOB: [birth year], 61 y.o.   MRN: [id number]   Subjective:    Patient reports pain as 0 on 0-10 scale.    Objective: Vital signs in last 24 hours: Temp:  [97.7 F (36.5 C)-99 F (37.2 C)] 98.2 F (36.8 C) (08/14 0730) Pulse Rate:  [83-91] 83 (08/14 0730) Resp:  [19-20] 20 (08/14 0345) BP: (105-154)/(53-81) 154/81 (08/14 0730) SpO2:  [93 %-98 %] 98 % (08/14 0730)  Intake/Output from previous day: 08/13 0701 - 08/14 0700 In: 366 [IV Piggyback:366] Out: 4950 [Urine:4950] Intake/Output this shift: No intake/output data recorded.  Recent Labs    06/22/22 2000 06/23/22 0431  HGB 12.1* 11.9*   Recent Labs    06/22/22 2000 06/23/22 0431  WBC 40.6* 36.8*  RBC 3.51* 3.40*  HCT 34.4* 32.0*  PLT 205 209   Recent Labs    06/22/22 2000 06/23/22 0431  NA 133* 136  K 3.6 3.5  CL 107 109  CO2 13* 15*  BUN 84* 80*  CREATININE 4.19* 3.63*  GLUCOSE 267* 203*  CALCIUM 8.3* 8.3*   Recent Labs    06/22/22 2000 06/23/22 0431  INR 1.7* 1.6*    No drainage from foot hard callus over the right mid plantar surface.  Left great toe tip necrosis with dry gangrene. US Abdomen Limited RUQ (LIVER/GB)  Result Date: 06/23/2022 CLINICAL DATA:  Transaminitis. EXAM: ULTRASOUND ABDOMEN LIMITED RIGHT UPPER QUADRANT COMPARISON:  None Available. FINDINGS: Gallbladder: No gallstones or wall thickening visualized. Gallbladder sludge is present. No sonographic Murphy sign noted by sonographer. Common bile duct: Diameter: 3.3 mm Liver: No focal lesion identified. Increase in parenchymal echogenicity. Portal vein is patent on color Doppler imaging with normal direction of blood flow towards the liver. Other: None. IMPRESSION: 1. Gallbladder sludge. 2. No cholelithiasis. 3. Echogenic liver likely related to diffuse fatty infiltration. Electronically Signed   By: Darliss Cheney M.D.   On: 06/23/2022 20:39   MRI Right foot without contrast  Result Date:  06/23/2022 CLINICAL DATA:  Diabetic foot wounds EXAM: MRI OF THE RIGHT FOREFOOT WITHOUT CONTRAST TECHNIQUE: Multiplanar, multisequence MR imaging of the right forefoot was performed. No intravenous contrast was administered. COMPARISON:  X-ray 06/20/2022, MRI 04/14/2021 FINDINGS: Bones/Joint/Cartilage Small second tarsometatarsal joint effusion. Bone marrow edema within the second metatarsal base extending to the level of the distal diaphysis with confluent low T1 signal changes in the second metatarsal base (series 6, image 14). There is patchy bone marrow edema and intermediate T1 signal within the adjacent intermediate cuneiform. Findings are suspicious for septic arthritis with osteomyelitis. Patchy areas of marrow edema are also present within the navicular, medial and lateral cuneiform bones, and second metatarsal base. No focal erosion or confluent low T1 signal changes at these locations to suggest acute osteomyelitis at this time. No acute fracture or dislocation. Susceptibility artifact between the second and third toes as well as between the third and fourth toes with resultant poor regional fat saturation. Ligaments Intact Lisfranc ligament.  Collateral ligaments appear intact. Muscles and Tendons Findings of myositis and/or denervation. No large tenosynovial fluid collection. Soft tissues Plantar soft tissue ulceration underlying the midfoot. Prominent dorsal subcutaneous edema. No organized fluid collection is evident on noncontrast imaging. Additional site of susceptibility artifact within the plantar soft tissues adjacent to the site of ulceration corresponding to surgical clips seen radiographically. IMPRESSION: 1. 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 2. Patchy areas  of marrow edema within the midfoot without focal erosion which could represent reactive osteitis or stress-related changes. 3. Plantar soft  tissue ulceration underlying the midfoot. 4. Marked dorsal subcutaneous edema and likely cellulitis. No organized fluid collections. 5. Findings of diffuse myositis and/or denervation. Electronically Signed   By: Duanne Guess D.O.   On: 06/23/2022 13:06    Assessment/Plan:    Plan: Long discussion with patient about findings on MRI scan as well as plain x-ray with the staple present and likely osteomyelitis second metatarsal phalangeal joint and questionably and second metatarsal bone.  Discussed recommendations for surgery and patient states he wants to wait until Dr. Lajoyce Corners gets back later this week and discuss it with Dr. Lajoyce Corners.  Patient discussed that may be if he used more boric acid on his foot or use socks instead of walking barefooted he would have this problem.  Long discussion with patient that his problem is diabetes which is not being treated and the importance of getting it treated before he ended up with amputations, kidney failure, blindness and other diabetic complications.  Currently he does not consent to surgery.  From my standpoint he can resume diabetic diet.  Dr. Due to currently out of town will see him later this week and likely can put him on surgery scheduled for Friday.  [name] 06/24/2022, 8:48 AM

## 2022-06-25 ENCOUNTER — Other Ambulatory Visit: Payer: Self-pay

## 2022-06-25 ENCOUNTER — Encounter (HOSPITAL_COMMUNITY): Payer: Self-pay | Admitting: Internal Medicine

## 2022-06-25 LAB — CULTURE, BLOOD (ROUTINE X 2): Culture: NO GROWTH

## 2022-06-25 LAB — CBC
HCT: 34.2 % — ABNORMAL LOW (ref 39.0–52.0)
Hemoglobin: 11.9 g/dL — ABNORMAL LOW (ref 13.0–17.0)
MCH: 34 pg (ref 26.0–34.0)
MCHC: 34.8 g/dL (ref 30.0–36.0)
MCV: 97.7 fL (ref 80.0–100.0)
Platelets: 288 10*3/uL (ref 150–400)
RBC: 3.5 MIL/uL — ABNORMAL LOW (ref 4.22–5.81)
RDW: 14.7 % (ref 11.5–15.5)
WBC: 28.1 10*3/uL — ABNORMAL HIGH (ref 4.0–10.5)
nRBC: 0.1 % (ref 0.0–0.2)

## 2022-06-25 LAB — GLUCOSE, CAPILLARY
Glucose-Capillary: 130 mg/dL — ABNORMAL HIGH (ref 70–99)
Glucose-Capillary: 85 mg/dL (ref 70–99)
Glucose-Capillary: 135 mg/dL — ABNORMAL HIGH (ref 70–99)
Glucose-Capillary: 150 mg/dL — ABNORMAL HIGH (ref 70–99)
Glucose-Capillary: 128 mg/dL — ABNORMAL HIGH (ref 70–99)
Glucose-Capillary: 136 mg/dL — ABNORMAL HIGH (ref 70–99)

## 2022-06-25 LAB — COMPREHENSIVE METABOLIC PANEL
ALT: 44 U/L (ref 0–44)
AST: 39 U/L (ref 15–41)
Albumin: 1.8 g/dL — ABNORMAL LOW (ref 3.5–5.0)
Alkaline Phosphatase: 178 U/L — ABNORMAL HIGH (ref 38–126)
Anion gap: 9 (ref 5–15)
BUN: 45 mg/dL — ABNORMAL HIGH (ref 6–20)
CO2: 17 mmol/L — ABNORMAL LOW (ref 22–32)
Calcium: 8.3 mg/dL — ABNORMAL LOW (ref 8.9–10.3)
Chloride: 111 mmol/L (ref 98–111)
Creatinine, Ser: 1.8 mg/dL — ABNORMAL HIGH (ref 0.61–1.24)
GFR, Estimated: 43 mL/min — ABNORMAL LOW (ref >60)
Glucose, Bld: 114 mg/dL — ABNORMAL HIGH (ref 70–99)
Potassium: 3.4 mmol/L — ABNORMAL LOW (ref 3.5–5.1)
Sodium: 137 mmol/L (ref 135–145)
Total Bilirubin: 1.1 mg/dL (ref 0.3–1.2)
Total Protein: 6.6 g/dL (ref 6.5–8.1)

## 2022-06-25 LAB — CK: Total CK: 86 U/L (ref 49–397)

## 2022-06-25 MED ORDER — LOSARTAN POTASSIUM 50 MG PO TABS
25.0000 mg | ORAL_TABLET | Freq: Every day | ORAL | Status: DC
  Administered 2022-06-25 – 2022-07-03 (×9): 25 mg via ORAL
  Filled 2022-06-25 (×9): qty 1

## 2022-06-25 MED ORDER — GERHARDT'S BUTT CREAM
TOPICAL_CREAM | Freq: Three times a day (TID) | CUTANEOUS | Status: DC
  Filled 2022-06-25 (×2): qty 1

## 2022-06-25 NOTE — Consult Note (Signed)
WOC Nurse Consult Note: Reason for Consult:buttocks and thighs Wound type: ICD (irritant contact dermatitis) ICD-10 CM Codes for Irritant Dermatitis L24A2 - Due to fecal, urinary or dual incontinence Pressure Injury POA: NA Measurement:scattered partial thickness skin loss inner thighs bilaterally, partial thickness skin loss over the bilateral buttocks with one area over the right ischial tuberosity that could be related to pressure additionally  Wound bed: pink, moist Drainage (amount, consistency, odor) none Periwound: intact  Dressing procedure/placement/frequency: Add Gerhardt's butt cream TID Manage incontinence with Purewick external male urine collector Bedside nursing reports fecal incontinence not an issue at this time.   Discussed POC with bedside nurse.  Re consult if needed, will not follow at this time. Thanks  Bassy Fetterly [employer], RN,CWOCN, CNS, CWON-AP [phone])

## 2022-06-25 NOTE — Plan of Care (Signed)

## 2022-06-25 NOTE — Hospital Course (Signed)
Jesse [name] is a 61 yo male with PMH DMII, noncompliance, ethanol use, hypertension and dyslipidemia who presented to the ED with altered mental status. He was found to have severe sepsis with diabetic foot ulcers, osteomyelitis, cellulitis and myositis as well as AKI.  Seen in the ED few days prior for same, left AMA then. ID and orthopedics consulted on admission.

## 2022-06-25 NOTE — TOC Initial Note (Signed)
Transition of Care [facility]) - Initial/Assessment Note    Patient Details  Name: Jesse [name] MRN: [id number] Date of Birth: July 04, 1961  Transition [facility]) CM/SW Contact:    Carley Hammed, LCSWA Phone Number: 06/25/2022, 4:04 PM  Clinical Narrative:                  CSW spoke with pt's sister [name] to confirm prior living situation and discuss barriers. [name] stated pt has been completely independent until now, and she knows that when he leaves the hospital, he may need care, that she is willing to provide. She asked about Medicaid and a hospital bill, CM had previously put in a consult to financial counseling. CSW allowed sister to voice her worries and concerns, and therapeutic listening was utilized. CSW answered all questions about options for when pt is ready to leave the hospital, but noted he is not medically ready, and we don't know what his needs will be. Sister was assured TOC was following and she expressed gratitude.       Patient Goals and CMS Choice        Expected Discharge Plan and Services                                                Prior Living Arrangements/Services                       Activities of Daily Living Home Assistive Devices/Equipment: None ADL Screening (condition at time of admission) Patient's cognitive ability adequate to safely complete daily activities?: No Is the patient deaf or have difficulty hearing?: No Does the patient have difficulty seeing, even when wearing glasses/contacts?: No Does the patient have difficulty concentrating, remembering, or making decisions?: Yes Patient able to express need for assistance with ADLs?: No Does the patient have difficulty dressing or bathing?: Yes Independently performs ADLs?: No Does the patient have difficulty walking or climbing stairs?: Yes Weakness of Legs: Both Weakness of Arms/Hands: Both  Permission Sought/Granted                  Emotional  Assessment              Admission diagnosis:  Diabetic foot infection (HCC) [Y40.347, L08.9] Subacute osteomyelitis of left foot (HCC) [Q25.956] Sepsis with acute renal failure without septic shock, due to unspecified organism, unspecified acute renal failure type (HCC) [A41.9, R65.20, N17.9] Patient Active Problem List   Diagnosis Date Noted   Diabetic foot infection (HCC) 06/22/2022   Sepsis (HCC) 06/22/2022   Cellulitis in diabetic foot (HCC) 04/17/2021   Osteomyelitis (HCC) 04/14/2021   HTN (hypertension) 04/08/2021   HLD (hyperlipidemia) 04/08/2021   Alcohol abuse    DM (diabetes mellitus) (HCC) 01/27/2014   AKI (acute kidney injury) (HCC) 01/27/2014   PCP:  Pcp, No Pharmacy:   [facility] [phone] - [place], [place] - [address] AT [facility] [address]  [place] [address] Phone: [phone] Fax: [phone]  [name] [address] [place] [address] Phone: [phone] Fax: [phone]     Social Determinants of Health (SDOH) Interventions    Readmission Risk Interventions     No data to display

## 2022-06-25 NOTE — Progress Notes (Signed)
Progress Note    Jesse [name]   GUY:[id number]  DOB: 1961/05/28  DOA: 06/22/2022     3 PCP: Pcp, No  Initial CC: AMS  Hospital Course: Jesse [name] is a 61 yo male with PMH DMII, noncompliance, ethanol use, hypertension and dyslipidemia who presented to the ED with altered mental status. He was found to have severe sepsis with diabetic foot ulcers, osteomyelitis, cellulitis and myositis as well as AKI.  Seen in the ED few days prior for same, left AMA then. ID and orthopedics consulted on admission.  Patient was recommended for surgery after evaluation by orthopedic surgery however wished to further discuss with Dr. Lajoyce Corners upon return back to the hospital.  Interval History:  No events overnight.  He is lying in bed comfortable appearing.  Still undecided about surgery.  I asked him what he would do if Dr. Lajoyce Corners also recommended surgery/amputation and he could not answer.  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 Lajoyce Corners upon return back to the hospital this week   Acute metabolic encephalopathy -Secondary to severe sepsis and AKI, improving   Cholestasis of sepsis -Secondary to sepsis, will trend   Type 2 diabetes mellitus -A1c is 8.3 - continue insulin regimen    AKI on CKD3a -Admitted with creatinine of 4.7, baseline was 1.6, around 1 year ago -Secondary to sepsis, likely ATN, resolving -Renal ultrasound without hydronephrosis, dysplastic left kidney   Alcohol use -No withdrawal noted, add thiamine   Hypertension -BP now stable, antihypertensives on hold   Old records reviewed in assessment of this patient  Antimicrobials:   DVT  prophylaxis:  heparin injection 5,000 Units Start: 06/23/22 1430 SCDs Start: 06/22/22 2306   Code Status:   Code Status: Full Code  Mobility Assessment (last 72 hours)     Mobility Assessment     Row Name 06/25/22 0800 06/24/22 2250 06/24/22 1000 06/23/22 0815     Does patient have an order for bedrest or is patient medically unstable No - Continue assessment No - Continue assessment No - Continue assessment No - Continue assessment    What is the highest level of mobility based on the progressive mobility assessment? Level 2 (Chairfast) - Balance while sitting on edge of bed and cannot stand -- Level 2 (Chairfast) - Balance while sitting on edge of bed and cannot stand  unable to walk due to sores on \\feet  Level 1 (Bedfast) - Unable to balance while sitting on edge of bed    Is the above level different from baseline mobility prior to current illness? Yes - Recommend PT order -- -- --             Barriers to discharge:  Disposition Plan:  Home Status is: Inpt  Objective: Blood pressure (!) 158/83, pulse 87, temperature 97.8 F (36.6 C), temperature source Oral, resp. rate 18, height 6' (1.829 m), weight 107.5 kg, SpO2 99 %.  Examination:  Physical Exam Constitutional:      General: He is not in acute distress.    Appearance: Normal appearance.  HENT:     Head: Normocephalic and atraumatic.     Mouth/Throat:     Mouth: Mucous membranes are moist.  Eyes:  Extraocular Movements: Extraocular movements intact.  Cardiovascular:     Rate and Rhythm: Normal rate and regular rhythm.     Heart sounds: Normal heart sounds.  Pulmonary:     Effort: Pulmonary effort is normal. No respiratory distress.     Breath sounds: Normal breath sounds. No wheezing.  Abdominal:     General: Bowel sounds are normal. There is no distension.     Palpations: Abdomen is soft.     Tenderness: There is no abdominal tenderness.  Musculoskeletal:        General: Normal range of motion.      Cervical back: Normal range of motion and neck supple.  Skin:    General: Skin is warm and dry.  Neurological:     General: No focal deficit present.     Mental Status: He is alert.  Psychiatric:        Mood and Affect: Mood normal.        Behavior: Behavior normal.      Consultants:  ID Orthopedic surgery  Procedures:    Data Reviewed: Results for orders placed or performed during the hospital encounter of 06/22/22 (from the past 24 hour(s))  Glucose, capillary     Status: Abnormal   Collection Time: 06/24/22  4:55 PM  Result Value Ref Range   Glucose-Capillary 111 (H) 70 - 99 mg/dL  Glucose, capillary     Status: Abnormal   Collection Time: 06/24/22  7:58 PM  Result Value Ref Range   Glucose-Capillary 121 (H) 70 - 99 mg/dL  Glucose, capillary     Status: Abnormal   Collection Time: 06/25/22  1:15 AM  Result Value Ref Range   Glucose-Capillary 130 (H) 70 - 99 mg/dL  Glucose, capillary     Status: None   Collection Time: 06/25/22  3:57 AM  Result Value Ref Range   Glucose-Capillary 85 70 - 99 mg/dL  CK     Status: None   Collection Time: 06/25/22  6:54 AM  Result Value Ref Range   Total CK 86 49 - 397 U/L  CBC     Status: Abnormal   Collection Time: 06/25/22  6:54 AM  Result Value Ref Range   WBC 28.1 (H) 4.0 - 10.5 K/uL   RBC 3.50 (L) 4.22 - 5.81 MIL/uL   Hemoglobin 11.9 (L) 13.0 - 17.0 g/dL   HCT 34.7 (L) 42.5 - 95.6 %   MCV 97.7 80.0 - 100.0 fL   MCH 34.0 26.0 - 34.0 pg   MCHC 34.8 30.0 - 36.0 g/dL   RDW 38.7 56.4 - 33.2 %   Platelets 288 150 - 400 K/uL   nRBC 0.1 0.0 - 0.2 %  Comprehensive metabolic panel     Status: Abnormal   Collection Time: 06/25/22  6:54 AM  Result Value Ref Range   Sodium 137 135 - 145 mmol/L   Potassium 3.4 (L) 3.5 - 5.1 mmol/L   Chloride 111 98 - 111 mmol/L   CO2 17 (L) 22 - 32 mmol/L   Glucose, Bld 114 (H) 70 - 99 mg/dL   BUN 45 (H) 6 - 20 mg/dL   Creatinine, Ser 9.51 (H) 0.61 - 1.24 mg/dL   Calcium 8.3 (L) 8.9 - 10.3 mg/dL    Total Protein 6.6 6.5 - 8.1 g/dL   Albumin 1.8 (L) 3.5 - 5.0 g/dL   AST 39 15 - 41 U/L   ALT 44 0 - 44 U/L   Alkaline Phosphatase 178 (H) 38 - 126  U/L   Total Bilirubin 1.1 0.3 - 1.2 mg/dL   GFR, Estimated 43 (L) >60 mL/min   Anion gap 9 5 - 15  Glucose, capillary     Status: Abnormal   Collection Time: 06/25/22  7:32 AM  Result Value Ref Range   Glucose-Capillary 135 (H) 70 - 99 mg/dL  Glucose, capillary     Status: Abnormal   Collection Time: 06/25/22 12:20 PM  Result Value Ref Range   Glucose-Capillary 150 (H) 70 - 99 mg/dL  Glucose, capillary     Status: Abnormal   Collection Time: 06/25/22  4:15 PM  Result Value Ref Range   Glucose-Capillary 128 (H) 70 - 99 mg/dL    I have Reviewed nursing notes, Vitals, and Lab results since pt's last encounter. Pertinent lab results : see above I have ordered test including BMP, CBC, Mg I have reviewed the last note from staff over past 24 hours I have discussed pt's care plan and test results with nursing staff, case manager   LOS: 3 days   Lewie Chamber, MD [facility] 06/25/2022, 4:38 PM

## 2022-06-25 NOTE — Plan of Care (Signed)
  Problem: Fluid Volume: Goal: Hemodynamic stability will improve Outcome: Not Progressing   Problem: Clinical Measurements: Goal: Diagnostic test results will improve Outcome: Not Progressing Goal: Signs and symptoms of infection will decrease Outcome: Not Progressing   Problem: Respiratory: Goal: Ability to maintain adequate ventilation will improve Outcome: Not Progressing   Problem: Education: Goal: Knowledge of General Education information will improve Description: Including pain rating scale, medication(s)/side effects and non-pharmacologic comfort measures Outcome: Not Progressing   Problem: Health Behavior/Discharge Planning: Goal: Ability to manage health-related needs will improve Outcome: Not Progressing   Problem: Clinical Measurements: Goal: Ability to maintain clinical measurements within normal limits will improve Outcome: Not Progressing Goal: Will remain free from infection Outcome: Not Progressing Goal: Diagnostic test results will improve Outcome: Not Progressing Goal: Respiratory complications will improve Outcome: Not Progressing Goal: Cardiovascular complication will be avoided Outcome: Not Progressing   

## 2022-06-25 NOTE — Progress Notes (Signed)
[facility] Infectious Diseases Follow Up Note  Patient Identification: Patient Name: Jesse [name] MRN: [id number] Admit Date: 06/22/2022  7:41 PM Age: 61 y.o.Today's Date: 06/25/2022  Reason for Visit: DFU/Osteomyelitis   Principal Problem:   Sepsis (HCC) Active Problems:   DM (diabetes mellitus) (HCC)   AKI (acute kidney injury) (HCC)   HTN (hypertension)   HLD (hyperlipidemia)   Osteomyelitis (HCC)   Cellulitis in diabetic foot (HCC)   Diabetic foot infection (HCC)  Antibiotics:  Vancomycin 8/10-c Cefepime 8/10-c Metronidazole 8/12-c   Lines/Hardware: none    Interval Events: continues to be afebrile, WBC down trending to 28   Assessment # 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 Thank you for the consult. Please page with pertinent questions or concerns.  ______________________________________________________________________ Subjective patient seen and examined at the bedside.  No complaints He is waiting to be evaluated by Dr Lajoyce Corners   Vitals BP (!) 148/91 (BP  Location: Left Arm)   Pulse 88   Temp 97.8 F (36.6 C) (Oral)   Resp 17   Ht 6' (1.829 m)   Wt 107.5 kg   SpO2 98%   BMI 32.14 kg/m     Physical Exam Constitutional:  lying in the bed and not in acute distress    Comments:   Cardiovascular:     Rate and Rhythm: Normal rate and regular rhythm.     Heart sounds:   Pulmonary:     Effort: Pulmonary effort is normal.     Comments:   Abdominal:     Palpations: Abdomen is soft.     Tenderness: non distended   Musculoskeletal:        General: Chronic skin changes in bilateral lower extremities. Left great toe wound with no acute signs of infection. Rt mid foot plantar ulcer with no drainage. ROM good in bilateral ankles and knee. Bilateral calves soft  Neurological:     General: grossly non focal, awake, alert and oriented   Psychiatric:        Mood and Affect: Mood normal.   Pertinent Microbiology Results for orders placed or performed during the hospital encounter of 06/22/22  Blood Culture (routine x 2)     Status: None (Preliminary result)   Collection Time: 06/22/22  8:00 PM   Specimen: BLOOD  Result Value Ref Range Status   Specimen Description BLOOD LEFT ANTECUBITAL  Final   Special Requests   Final    BOTTLES DRAWN AEROBIC AND ANAEROBIC Blood Culture results may not be optimal due to an excessive volume of blood received in culture  bottles   Culture   Final    NO GROWTH 3 DAYS Performed at [facility], [address], [place], [place] [address]    Report Status PENDING  Incomplete  Blood Culture (routine x 2)     Status: None (Preliminary result)   Collection Time: 06/22/22  8:10 PM   Specimen: BLOOD  Result Value Ref Range Status   Specimen Description BLOOD RIGHT ANTECUBITAL  Final   Special Requests   Final    BOTTLES DRAWN AEROBIC AND ANAEROBIC Blood Culture adequate volume   Culture   Final    NO GROWTH 3 DAYS Performed at [facility], [address], [place], [place] [address]     Report Status PENDING  Incomplete  Urine Culture     Status: Abnormal   Collection Time: 06/22/22  9:21 PM   Specimen: In/Out Cath Urine  Result Value Ref Range Status   Specimen Description IN/OUT CATH URINE  Final   Special Requests NONE  Final   Culture (A)  Final    <10,000 COLONIES/mL INSIGNIFICANT GROWTH Performed at [facility], [address] [place], [place] [address]    Report Status 06/24/2022 FINAL  Final    Pertinent Lab.    Latest Ref Rng & Units 06/25/2022    6:54 AM 06/24/2022    9:44 AM 06/23/2022    4:31 AM  CBC  WBC 4.0 - 10.5 K/uL 28.1  38.7  36.8   Hemoglobin 13.0 - 17.0 g/dL 29.4  76.5  46.5   Hematocrit 39.0 - 52.0 % 34.2  33.5  32.0   Platelets 150 - 400 K/uL 288  298  209       Latest Ref Rng & Units 06/25/2022    6:54 AM 06/24/2022    9:44 AM 06/23/2022    4:31 AM  CMP  Glucose 70 - 99 mg/dL 035  465  681   BUN 6 - 20 mg/dL 45  62  80   Creatinine 0.61 - 1.24 mg/dL 2.75  1.70  0.17   Sodium 135 - 145 mmol/L 137  140  136   Potassium 3.5 - 5.1 mmol/L 3.4  3.4  3.5   Chloride 98 - 111 mmol/L 111  112  109   CO2 22 - 32 mmol/L 17  18  15    Calcium 8.9 - 10.3 mg/dL 8.3  8.7  8.3   Total Protein 6.5 - 8.1 g/dL 6.6   6.3   Total Bilirubin 0.3 - 1.2 mg/dL 1.1   1.3   Alkaline Phos 38 - 126 U/L 178   156   AST 15 - 41 U/L 39   106   ALT 0 - 44 U/L 44   76      Pertinent Imaging today Plain films and CT images have been personally visualized and interpreted; radiology reports have been reviewed. Decision making incorporated into the Impression / Recommendations.  I spent 51 minutes for this patient encounter including review of prior medical records, coordination of care with primary/other specialist with greater than 50% of time being face to face/counseling and discussing diagnostics/treatment plan with the patient/family.  Electronically signed by:   , MD Infectious Disease Physician [facility]  Infectious Disease Pager: [phone]

## 2022-06-26 LAB — CBC WITH DIFFERENTIAL/PLATELET
Abs Immature Granulocytes: 3.31 10*3/uL — ABNORMAL HIGH (ref 0.00–0.07)
Basophils Absolute: 0 10*3/uL (ref 0.0–0.1)
Basophils Relative: 0 %
Eosinophils Absolute: 0.1 10*3/uL (ref 0.0–0.5)
Eosinophils Relative: 1 %
HCT: 37.4 % — ABNORMAL LOW (ref 39.0–52.0)
Hemoglobin: 12.9 g/dL — ABNORMAL LOW (ref 13.0–17.0)
Immature Granulocytes: 15 %
Lymphocytes Relative: 8 %
Lymphs Abs: 1.9 10*3/uL (ref 0.7–4.0)
MCH: 34.3 pg — ABNORMAL HIGH (ref 26.0–34.0)
MCHC: 34.5 g/dL (ref 30.0–36.0)
MCV: 99.5 fL (ref 80.0–100.0)
Monocytes Absolute: 2.4 10*3/uL — ABNORMAL HIGH (ref 0.1–1.0)
Monocytes Relative: 11 %
Neutro Abs: 15 10*3/uL — ABNORMAL HIGH (ref 1.7–7.7)
Neutrophils Relative %: 65 %
Platelets: 253 10*3/uL (ref 150–400)
RBC: 3.76 MIL/uL — ABNORMAL LOW (ref 4.22–5.81)
RDW: 15.1 % (ref 11.5–15.5)
WBC: 22.8 10*3/uL — ABNORMAL HIGH (ref 4.0–10.5)
nRBC: 0.1 % (ref 0.0–0.2)

## 2022-06-26 LAB — GLUCOSE, CAPILLARY
Glucose-Capillary: 105 mg/dL — ABNORMAL HIGH (ref 70–99)
Glucose-Capillary: 114 mg/dL — ABNORMAL HIGH (ref 70–99)
Glucose-Capillary: 139 mg/dL — ABNORMAL HIGH (ref 70–99)
Glucose-Capillary: 145 mg/dL — ABNORMAL HIGH (ref 70–99)
Glucose-Capillary: 152 mg/dL — ABNORMAL HIGH (ref 70–99)
Glucose-Capillary: 167 mg/dL — ABNORMAL HIGH (ref 70–99)
Glucose-Capillary: 82 mg/dL (ref 70–99)

## 2022-06-26 LAB — COMPREHENSIVE METABOLIC PANEL
ALT: 38 U/L (ref 0–44)
AST: 47 U/L — ABNORMAL HIGH (ref 15–41)
Albumin: 1.9 g/dL — ABNORMAL LOW (ref 3.5–5.0)
Alkaline Phosphatase: 181 U/L — ABNORMAL HIGH (ref 38–126)
Anion gap: 9 (ref 5–15)
BUN: 35 mg/dL — ABNORMAL HIGH (ref 6–20)
CO2: 18 mmol/L — ABNORMAL LOW (ref 22–32)
Calcium: 8.3 mg/dL — ABNORMAL LOW (ref 8.9–10.3)
Chloride: 109 mmol/L (ref 98–111)
Creatinine, Ser: 1.49 mg/dL — ABNORMAL HIGH (ref 0.61–1.24)
GFR, Estimated: 54 mL/min — ABNORMAL LOW (ref >60)
Glucose, Bld: 104 mg/dL — ABNORMAL HIGH (ref 70–99)
Potassium: 4.2 mmol/L (ref 3.5–5.1)
Sodium: 136 mmol/L (ref 135–145)
Total Bilirubin: 1.5 mg/dL — ABNORMAL HIGH (ref 0.3–1.2)
Total Protein: 7.6 g/dL (ref 6.5–8.1)

## 2022-06-26 LAB — HEPATITIS B SURFACE ANTIBODY, QUANTITATIVE: Hep B S AB Quant (Post): 34.7 m[IU]/mL (ref >9.9)

## 2022-06-26 LAB — MAGNESIUM: Magnesium: 1.6 mg/dL — ABNORMAL LOW (ref 1.7–2.4)

## 2022-06-26 MED ORDER — MAGNESIUM SULFATE 2 GM/50ML IV SOLN
2.0000 g | Freq: Once | INTRAVENOUS | Status: AC
  Administered 2022-06-26: 2 g via INTRAVENOUS
  Filled 2022-06-26: qty 50

## 2022-06-26 MED ORDER — SODIUM CHLORIDE 0.9 % IV SOLN
8.0000 mg/kg | Freq: Every day | INTRAVENOUS | Status: DC
  Administered 2022-06-26 – 2022-07-02 (×7): 700 mg via INTRAVENOUS
  Filled 2022-06-26 (×9): qty 14

## 2022-06-26 MED ORDER — SODIUM CHLORIDE 0.9 % IV SOLN
2.0000 g | Freq: Three times a day (TID) | INTRAVENOUS | Status: DC
  Administered 2022-06-26 – 2022-07-03 (×21): 2 g via INTRAVENOUS
  Filled 2022-06-26 (×22): qty 12.5

## 2022-06-26 NOTE — Progress Notes (Signed)
PHARMACY NOTE:  ANTIMICROBIAL RENAL DOSAGE ADJUSTMENT  Current antimicrobial regimen includes a mismatch between antimicrobial dosage and estimated renal function.  As per policy approved by the Pharmacy & Therapeutics and Medical Executive Committees, the antimicrobial dosage will be adjusted accordingly.  Current antimicrobial dosage:  Cefepime 2g IV Q12H   Indication: Septic arthritis with osteomyelitis   Renal Function:  Estimated Creatinine Clearance: 67.7 mL/min (A) (by C-G formula based on SCr of 1.49 mg/dL (H)). []      On intermittent HD, scheduled: []      On CRRT    Antimicrobial dosage has been changed to:  Cefepime 2g IV Q8H   Additional comments:   Thank you for allowing pharmacy to be a part of this patient's care.  , PharmD PGY-2 Infectious Diseases Resident  06/26/2022 6:58 AM

## 2022-06-26 NOTE — Plan of Care (Signed)
  Problem: Fluid Volume: Goal: Hemodynamic stability will improve Outcome: Not Progressing   Problem: Clinical Measurements: Goal: Diagnostic test results will improve Outcome: Not Progressing Goal: Signs and symptoms of infection will decrease Outcome: Not Progressing   Problem: Respiratory: Goal: Ability to maintain adequate ventilation will improve Outcome: Not Progressing   Problem: Education: Goal: Knowledge of General Education information will improve Description: Including pain rating scale, medication(s)/side effects and non-pharmacologic comfort measures Outcome: Not Progressing   Problem: Health Behavior/Discharge Planning: Goal: Ability to manage health-related needs will improve Outcome: Not Progressing   Problem: Clinical Measurements: Goal: Ability to maintain clinical measurements within normal limits will improve Outcome: Not Progressing Goal: Will remain free from infection Outcome: Not Progressing Goal: Diagnostic test results will improve Outcome: Not Progressing Goal: Respiratory complications will improve Outcome: Not Progressing Goal: Cardiovascular complication will be avoided Outcome: Not Progressing

## 2022-06-26 NOTE — Progress Notes (Signed)
PHARMACY ANTIBIOTIC CONSULT NOTE   Jesse [name] a 61 y.o. male admitted on 06/22/2022 with a wound infection. Patient found to have MRI findings c/w osteomyelitis of the second metatarsal phalangeal joint. Ortho recommending surgical intervention, but the patient does not consent at this time. He wishes to speak with Dr. Lajoyce Corners. Pharmacy has been consulted for daptomycin and cefepime dosing.  Scr 1.49 8/16 and continuing to improve.   Estimated Creatinine Clearance: 67.7 mL/min (A) (by C-G formula based on SCr of 1.49 mg/dL (H)).  Plan: CONTINUE Cefepime 2g IV Q8H  DECREASE Daptomycin dose to 700 mg Q2000 (~ 8mg /kg AdjBW given BMI >30)  Continue to monitor renal function, surgical plans, ID recs, and DOT  CK Qtues   Allergies:  No Known Allergies  Filed Weights   06/23/22 0500  Weight: 107.5 kg (236 lb 15.9 oz)       Latest Ref Rng & Units 06/26/2022    4:27 AM 06/25/2022    6:54 AM 06/24/2022    9:44 AM  CBC  WBC 4.0 - 10.5 K/uL 22.8  28.1  38.7   Hemoglobin 13.0 - 17.0 g/dL 06/26/2022  56.3  87.5   Hematocrit 39.0 - 52.0 % 37.4  34.2  33.5   Platelets 150 - 400 K/uL 253  288  298     Antibiotics Given (last 72 hours)     Date/Time Action Medication Dose Rate   06/23/22 2052 New Bag/Given   DAPTOmycin (CUBICIN) 800 mg in sodium chloride 0.9 % IVPB 800 mg 132 mL/hr   06/23/22 2230 New Bag/Given   ceFEPIme (MAXIPIME) 2 g in sodium chloride 0.9 % 100 mL IVPB 2 g 200 mL/hr   06/24/22 0000 New Bag/Given   metroNIDAZOLE (FLAGYL) IVPB 500 mg 500 mg 100 mL/hr   06/24/22 1134 New Bag/Given   metroNIDAZOLE (FLAGYL) IVPB 500 mg 500 mg 100 mL/hr   06/24/22 1203 New Bag/Given   ceFEPIme (MAXIPIME) 2 g in sodium chloride 0.9 % 100 mL IVPB 2 g 200 mL/hr   06/24/22 2251 New Bag/Given   DAPTOmycin (CUBICIN) 800 mg in sodium chloride 0.9 % IVPB 800 mg 132 mL/hr   06/24/22 2252 New Bag/Given   metroNIDAZOLE (FLAGYL) IVPB 500 mg 500 mg 100 mL/hr   06/24/22 2300 New Bag/Given   ceFEPIme  (MAXIPIME) 2 g in sodium chloride 0.9 % 100 mL IVPB 2 g 200 mL/hr   06/25/22 1055 New Bag/Given   ceFEPIme (MAXIPIME) 2 g in sodium chloride 0.9 % 100 mL IVPB 2 g 200 mL/hr   06/25/22 1106 New Bag/Given   metroNIDAZOLE (FLAGYL) IVPB 500 mg 500 mg 100 mL/hr   06/25/22 2107 New Bag/Given   metroNIDAZOLE (FLAGYL) IVPB 500 mg 500 mg 100 mL/hr   06/25/22 2108 New Bag/Given   DAPTOmycin (CUBICIN) 800 mg in sodium chloride 0.9 % IVPB 800 mg 132 mL/hr   06/25/22 2208 New Bag/Given   ceFEPIme (MAXIPIME) 2 g in sodium chloride 0.9 % 100 mL IVPB 2 g 200 mL/hr   06/26/22 1038 New Bag/Given  [Had to obtain iv access]   ceFEPIme (MAXIPIME) 2 g in sodium chloride 0.9 % 100 mL IVPB 2 g 200 mL/hr   06/26/22 1045 New Bag/Given   metroNIDAZOLE (FLAGYL) IVPB 500 mg 500 mg 100 mL/hr       Antimicrobials this admission: Daptomycin 8/13 >>  Cefepime 8/13 >> Vanc 8/13 x1 Flagyl 8/12 >> (8/19)  Microbiology results: 8/12 Bcx: ngtd x2d 8/12 Ucx: insignificant growth  Thank you for  allowing pharmacy to be a part of this patient's care.  Jani Gravel, PharmD PGY-2 Infectious Diseases Resident  06/26/2022 1:14 PM

## 2022-06-26 NOTE — Progress Notes (Signed)
Progress Note    Jesse [name]   PNT:[id number]  DOB: 1960-12-23  DOA: 06/22/2022     4 PCP: Pcp, No  Initial CC: AMS  Hospital Course: Jesse [name] is a 61 yo male with PMH DMII, noncompliance, ethanol use, hypertension and dyslipidemia who presented to the ED with altered mental status. He was found to have severe sepsis with diabetic foot ulcers, osteomyelitis, cellulitis and myositis as well as AKI.  Seen in the ED few days prior for same, left AMA then. ID and orthopedics consulted on admission.  Patient was recommended for surgery after evaluation by orthopedic surgery however wished to further discuss with Dr. Lajoyce Corners upon return back to the hospital.  Interval History:  No events overnight.  Remains comfortable. I told him probable d/c tomorrow if he meets with Dr. Lajoyce Corners by then and he still declines surgery.   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 Lajoyce Corners; I called and spoke to office (hopeful for patient to be evaluated either tonight or tomorrow).    Acute metabolic encephalopathy -Secondary to severe sepsis and AKI, improving   Cholestasis of sepsis -Secondary to sepsis, will trend   Type 2 diabetes mellitus -A1c is 8.3 - continue insulin regimen    AKI on CKD3a -Admitted with creatinine of 4.7, baseline was 1.6, around 1 year ago -Secondary to sepsis, likely ATN, resolving -Renal ultrasound without hydronephrosis, dysplastic left kidney   Alcohol use -No withdrawal noted, add thiamine   Hypertension -BP now stable, antihypertensives on hold   Old records reviewed in assessment of this patient  Antimicrobials: Cefepime 8/13  >> current Dapto 8/13 >> current   DVT prophylaxis:  heparin injection 5,000 Units Start: 06/23/22 1430 SCDs Start: 06/22/22 2306   Code Status:   Code Status: Full Code  Mobility Assessment (last 72 hours)     Mobility Assessment     Row Name 06/26/22 0900 06/26/22 0715 06/25/22 2058 06/25/22 0800 06/24/22 2250   Does patient have an order for bedrest or is patient medically unstable No - Continue assessment No - Continue assessment No - Continue assessment No - Continue assessment No - Continue assessment   What is the highest level of mobility based on the progressive mobility assessment? Level 1 (Bedfast) - Unable to balance while sitting on edge of bed Level 2 (Chairfast) - Balance while sitting on edge of bed and cannot stand Level 2 (Chairfast) - Balance while sitting on edge of bed and cannot stand Level 2 (Chairfast) - Balance while sitting on edge of bed and cannot stand --   Is the above level different from baseline mobility prior to current illness? Yes - Recommend PT order Yes - Recommend PT order Yes - Recommend PT order Yes - Recommend PT order --    Row Name 06/24/22 1000           Does patient have an order for bedrest or is patient medically unstable No - Continue assessment       What is the highest level of mobility based on the progressive mobility assessment? Level 2 (Chairfast) - Balance while sitting on edge of bed and cannot stand  unable to walk due to sores on \\feet   Barriers to discharge:  Disposition Plan:  Home Status is: Inpt  Objective: Blood pressure 135/78, pulse 79, temperature 98.4 F (36.9 C), temperature source Oral, resp. rate (!) 26, height 6' (1.829 m), weight 107.5 kg, SpO2 96 %.  Examination:  Physical Exam Constitutional:      General: He is not in acute distress.    Appearance: Normal appearance.  HENT:     Head: Normocephalic and atraumatic.     Mouth/Throat:     Mouth: Mucous membranes are moist.  Eyes:      Extraocular Movements: Extraocular movements intact.  Cardiovascular:     Rate and Rhythm: Normal rate and regular rhythm.     Heart sounds: Normal heart sounds.  Pulmonary:     Effort: Pulmonary effort is normal. No respiratory distress.     Breath sounds: Normal breath sounds. No wheezing.  Abdominal:     General: Bowel sounds are normal. There is no distension.     Palpations: Abdomen is soft.     Tenderness: There is no abdominal tenderness.  Musculoskeletal:     Cervical back: Normal range of motion and neck supple.     Comments: Right foot edematous (chronically), minimal TTP  Skin:    General: Skin is warm and dry.  Neurological:     General: No focal deficit present.     Mental Status: He is alert.  Psychiatric:        Mood and Affect: Mood normal.        Behavior: Behavior normal.      Consultants:  ID Orthopedic surgery  Procedures:    Data Reviewed: Results for orders placed or performed during the hospital encounter of 06/22/22 (from the past 24 hour(s))  Glucose, capillary     Status: Abnormal   Collection Time: 06/25/22  4:15 PM  Result Value Ref Range   Glucose-Capillary 128 (H) 70 - 99 mg/dL  Glucose, capillary     Status: Abnormal   Collection Time: 06/25/22  8:04 PM  Result Value Ref Range   Glucose-Capillary 136 (H) 70 - 99 mg/dL  Glucose, capillary     Status: Abnormal   Collection Time: 06/26/22 12:20 AM  Result Value Ref Range   Glucose-Capillary 167 (H) 70 - 99 mg/dL  CBC with Differential/Platelet     Status: Abnormal   Collection Time: 06/26/22  4:27 AM  Result Value Ref Range   WBC 22.8 (H) 4.0 - 10.5 K/uL   RBC 3.76 (L) 4.22 - 5.81 MIL/uL   Hemoglobin 12.9 (L) 13.0 - 17.0 g/dL   HCT 46.2 (L) 70.3 - 50.0 %   MCV 99.5 80.0 - 100.0 fL   MCH 34.3 (H) 26.0 - 34.0 pg   MCHC 34.5 30.0 - 36.0 g/dL   RDW 93.8 18.2 - 99.3 %   Platelets 253 150 - 400 K/uL   nRBC 0.1 0.0 - 0.2 %   Neutrophils Relative % 65 %   Neutro Abs 15.0 (H) 1.7 - 7.7  K/uL   Lymphocytes Relative 8 %   Lymphs Abs 1.9 0.7 - 4.0 K/uL   Monocytes Relative 11 %   Monocytes Absolute 2.4 (H) 0.1 - 1.0 K/uL   Eosinophils Relative 1 %   Eosinophils Absolute 0.1 0.0 - 0.5 K/uL   Basophils Relative 0 %   Basophils Absolute 0.0 0.0 - 0.1 K/uL   WBC Morphology See Note    RBC Morphology MORPHOLOGY UNREMARKABLE    Smear Review MORPHOLOGY UNREMARKABLE    Immature Granulocytes  15 %   Abs Immature Granulocytes 3.31 (H) 0.00 - 0.07 K/uL  Comprehensive metabolic panel     Status: Abnormal   Collection Time: 06/26/22  4:27 AM  Result Value Ref Range   Sodium 136 135 - 145 mmol/L   Potassium 4.2 3.5 - 5.1 mmol/L   Chloride 109 98 - 111 mmol/L   CO2 18 (L) 22 - 32 mmol/L   Glucose, Bld 104 (H) 70 - 99 mg/dL   BUN 35 (H) 6 - 20 mg/dL   Creatinine, Ser 1.61 (H) 0.61 - 1.24 mg/dL   Calcium 8.3 (L) 8.9 - 10.3 mg/dL   Total Protein 7.6 6.5 - 8.1 g/dL   Albumin 1.9 (L) 3.5 - 5.0 g/dL   AST 47 (H) 15 - 41 U/L   ALT 38 0 - 44 U/L   Alkaline Phosphatase 181 (H) 38 - 126 U/L   Total Bilirubin 1.5 (H) 0.3 - 1.2 mg/dL   GFR, Estimated 54 (L) >60 mL/min   Anion gap 9 5 - 15  Magnesium     Status: Abnormal   Collection Time: 06/26/22  4:27 AM  Result Value Ref Range   Magnesium 1.6 (L) 1.7 - 2.4 mg/dL  Glucose, capillary     Status: None   Collection Time: 06/26/22  4:48 AM  Result Value Ref Range   Glucose-Capillary 82 70 - 99 mg/dL  Glucose, capillary     Status: Abnormal   Collection Time: 06/26/22  7:33 AM  Result Value Ref Range   Glucose-Capillary 105 (H) 70 - 99 mg/dL  Glucose, capillary     Status: Abnormal   Collection Time: 06/26/22 11:29 AM  Result Value Ref Range   Glucose-Capillary 145 (H) 70 - 99 mg/dL    I have Reviewed nursing notes, Vitals, and Lab results since pt's last encounter. Pertinent lab results : see above I have ordered test including BMP, CBC, Mg I have reviewed the last note from staff over past 24 hours I have discussed pt's care  plan and test results with nursing staff, case manager   LOS: 4 days   Lewie Chamber, MD [facility] 06/26/2022, 4:14 PM

## 2022-06-27 DIAGNOSIS — Z794 Long term (current) use of insulin: Secondary | ICD-10-CM

## 2022-06-27 DIAGNOSIS — I87331 Chronic 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: Secondary | ICD-10-CM

## 2022-06-27 DIAGNOSIS — E43 Unspecified severe protein-calorie malnutrition: Secondary | ICD-10-CM

## 2022-06-27 DIAGNOSIS — E1169 Type 2 diabetes mellitus with other specified complication: Secondary | ICD-10-CM

## 2022-06-27 DIAGNOSIS — N179 Acute kidney failure, unspecified: Secondary | ICD-10-CM

## 2022-06-27 LAB — CULTURE, BLOOD (ROUTINE X 2)
Culture: NO GROWTH
Culture: NO GROWTH
Special Requests: ADEQUATE

## 2022-06-27 LAB — CBC WITH DIFFERENTIAL/PLATELET
Abs Immature Granulocytes: 1.74 10*3/uL — ABNORMAL HIGH (ref 0.00–0.07)
Basophils Absolute: 0.1 10*3/uL (ref 0.0–0.1)
Basophils Relative: 1 %
Eosinophils Absolute: 0.1 10*3/uL (ref 0.0–0.5)
Eosinophils Relative: 1 %
HCT: 34 % — ABNORMAL LOW (ref 39.0–52.0)
Hemoglobin: 11.7 g/dL — ABNORMAL LOW (ref 13.0–17.0)
Immature Granulocytes: 11 %
Lymphocytes Relative: 11 %
Lymphs Abs: 1.7 10*3/uL (ref 0.7–4.0)
MCH: 34.3 pg — ABNORMAL HIGH (ref 26.0–34.0)
MCHC: 34.4 g/dL (ref 30.0–36.0)
MCV: 99.7 fL (ref 80.0–100.0)
Monocytes Absolute: 2.1 10*3/uL — ABNORMAL HIGH (ref 0.1–1.0)
Monocytes Relative: 13 %
Neutro Abs: 10.1 10*3/uL — ABNORMAL HIGH (ref 1.7–7.7)
Neutrophils Relative %: 63 %
Platelets: 271 10*3/uL (ref 150–400)
RBC: 3.41 MIL/uL — ABNORMAL LOW (ref 4.22–5.81)
RDW: 15 % (ref 11.5–15.5)
WBC: 15.9 10*3/uL — ABNORMAL HIGH (ref 4.0–10.5)
nRBC: 0 % (ref 0.0–0.2)

## 2022-06-27 LAB — COMPREHENSIVE METABOLIC PANEL
ALT: 33 U/L (ref 0–44)
AST: 36 U/L (ref 15–41)
Albumin: 1.8 g/dL — ABNORMAL LOW (ref 3.5–5.0)
Alkaline Phosphatase: 155 U/L — ABNORMAL HIGH (ref 38–126)
Anion gap: 9 (ref 5–15)
BUN: 29 mg/dL — ABNORMAL HIGH (ref 6–20)
CO2: 17 mmol/L — ABNORMAL LOW (ref 22–32)
Calcium: 8.3 mg/dL — ABNORMAL LOW (ref 8.9–10.3)
Chloride: 112 mmol/L — ABNORMAL HIGH (ref 98–111)
Creatinine, Ser: 1.26 mg/dL — ABNORMAL HIGH (ref 0.61–1.24)
GFR, Estimated: >60 mL/min (ref >60)
Glucose, Bld: 104 mg/dL — ABNORMAL HIGH (ref 70–99)
Potassium: 3.7 mmol/L (ref 3.5–5.1)
Sodium: 138 mmol/L (ref 135–145)
Total Bilirubin: 0.9 mg/dL (ref 0.3–1.2)
Total Protein: 7.4 g/dL (ref 6.5–8.1)

## 2022-06-27 LAB — GLUCOSE, CAPILLARY
Glucose-Capillary: 111 mg/dL — ABNORMAL HIGH (ref 70–99)
Glucose-Capillary: 102 mg/dL — ABNORMAL HIGH (ref 70–99)
Glucose-Capillary: 148 mg/dL — ABNORMAL HIGH (ref 70–99)
Glucose-Capillary: 169 mg/dL — ABNORMAL HIGH (ref 70–99)
Glucose-Capillary: 143 mg/dL — ABNORMAL HIGH (ref 70–99)

## 2022-06-27 LAB — SEDIMENTATION RATE: Sed Rate: 107 mm/hr — ABNORMAL HIGH (ref 0–16)

## 2022-06-27 LAB — C-REACTIVE PROTEIN: CRP: 16.9 mg/dL — ABNORMAL HIGH (ref <1.0)

## 2022-06-27 LAB — MAGNESIUM: Magnesium: 1.8 mg/dL (ref 1.7–2.4)

## 2022-06-27 NOTE — Evaluation (Signed)
Physical Therapy Evaluation Patient Details Name: Jesse [name] MRN: [id number] DOB: [birth year] Today's Date: 06/27/2022  History of Present Illness  61 y/o male presented to ED on 06/22/22 for AMS and possible sepsis. Thumb size hole in R foot after stepping on nail 6 months ago noted in ED. MRI showed edema at base of second metatarsal consistent more with Charcot changes than osteomyelitis. Admitted for sepsis and R calf cellulitis. PMH: T2DM, HTN, alcohol abuse  Clinical Impression  Patient admitted with the above. PTA, patient lives alone and was independent. Currently, patient presents with weakness, impaired balance, decreased activity tolerance, and impaired cognition. Patient oriented x 4 but generally confused during conversation and problem solving. Patient ambulated in the room with min guard and use of RW. Patient deferred further mobility due to not wanting to "push it too far". Patient will benefit from skilled PT services during acute stay to address listed deficits. Recommend HHPT at discharge to maximize functional independence and safety. If cognition does not seem to be improving and family unable to provide necessary assistance, may need SNF.        Recommendations for follow up therapy are one component of a multi-disciplinary discharge planning process, led by the attending physician.  Recommendations may be updated based on patient status, additional functional criteria and insurance authorization.  Follow Up Recommendations Home health PT      Assistance Recommended at Discharge Frequent or constant Supervision/Assistance  Patient can return home with the following  A little help with walking and/or transfers;A little help with bathing/dressing/bathroom;Assistance with cooking/housework;Direct supervision/assist for medications management;Direct supervision/assist for financial management;Assist for transportation    Equipment Recommendations Rolling Pleshette Tomasini (2 wheels)   Recommendations for Other Services       Functional Status Assessment Patient has had a recent decline in their functional status and demonstrates the ability to make significant improvements in function in a reasonable and predictable amount of time.     Precautions / Restrictions Precautions Precautions: Fall Precaution Comments: B LE wounds Restrictions Weight Bearing Restrictions: No      Mobility  Bed Mobility Overal bed mobility: Modified Independent             General bed mobility comments: increased time    Transfers Overall transfer level: Needs assistance Equipment used: Rolling Eveleigh Crumpler (2 wheels) Transfers: Sit to/from Stand Sit to Stand: Min assist           General transfer comment: cues for hand placement. Assist to steady upon standing    Ambulation/Gait Ambulation/Gait assistance: Min guard Gait Distance (Feet): 35 Feet Assistive device: Rolling Omara Alcon (2 wheels) Gait Pattern/deviations: Step-through pattern, Decreased stride length Gait velocity: decreased     General Gait Details: at times, dragging R foot. Min guard for safety. No increase in pain. Heavy reliance on UE support  Stairs            Wheelchair Mobility    Modified Rankin (Stroke Patients Only)       Balance Overall balance assessment: Needs assistance Sitting-balance support: No upper extremity supported, Feet supported Sitting balance-Leahy Scale: Good     Standing balance support: Bilateral upper extremity supported, Reliant on assistive device for balance Standing balance-Leahy Scale: Poor                               Pertinent Vitals/Pain Pain Assessment Pain Assessment: No/denies pain    Home Living Family/patient expects to be discharged to::  Private residence Living Arrangements: Alone Available Help at Discharge: Family;Available PRN/intermittently Type of Home: House Home Access: Level entry Entrance Stairs-Rails: None Entrance  Stairs-Number of Steps: 2   Home Layout: One level Home Equipment: None      Prior Function Prior Level of Function : Independent/Modified Independent                     Hand Dominance        Extremity/Trunk Assessment   Upper Extremity Assessment Upper Extremity Assessment: Overall WFL for tasks assessed    Lower Extremity Assessment Lower Extremity Assessment: Generalized weakness    Cervical / Trunk Assessment Cervical / Trunk Assessment: Normal  Communication   Communication: No difficulties  Cognition Arousal/Alertness: Awake/alert Behavior During Therapy: WFL for tasks assessed/performed Overall Cognitive Status: Impaired/Different from baseline Area of Impairment: Attention, Memory, Safety/judgement, Awareness, Problem solving                   Current Attention Level: Sustained Memory: Decreased short-term memory   Safety/Judgement: Decreased awareness of deficits, Decreased awareness of safety Awareness: Intellectual Problem Solving: Decreased initiation, Difficulty sequencing, Requires verbal cues, Requires tactile cues, Slow processing General Comments: A&Ox4 but generally confused in conversation and inconsistent answers        General Comments      Exercises     Assessment/Plan    PT Assessment Patient needs continued PT services  PT Problem List Decreased strength;Decreased activity tolerance;Decreased balance;Decreased mobility;Decreased cognition;Decreased safety awareness;Decreased knowledge of precautions       PT Treatment Interventions DME instruction;Gait training;Functional mobility training;Therapeutic activities;Therapeutic exercise;Balance training;Patient/family education    PT Goals (Current goals can be found in the Care Plan section)  Acute Rehab PT Goals Patient Stated Goal: did not state PT Goal Formulation: With family Time For Goal Achievement: 07/11/22 Potential to Achieve Goals: Good    Frequency Min  3X/week     Co-evaluation               AM-PAC PT "6 Clicks" Mobility  Outcome Measure Help needed turning from your back to your side while in a flat bed without using bedrails?: None Help needed moving from lying on your back to sitting on the side of a flat bed without using bedrails?: None Help needed moving to and from a bed to a chair (including a wheelchair)?: A Little Help needed standing up from a chair using your arms (e.g., wheelchair or bedside chair)?: A Little Help needed to walk in hospital room?: A Little Help needed climbing 3-5 steps with a railing? : A Lot 6 Click Score: 19    End of Session Equipment Utilized During Treatment: Gait belt Activity Tolerance: Patient tolerated treatment well Patient left: in chair;with call bell/phone within reach;with chair alarm set Nurse Communication: Mobility status PT Visit Diagnosis: Unsteadiness on feet (R26.81);Muscle weakness (generalized) (M62.81);Difficulty in walking, not elsewhere classified (R26.2)    Time: 7169-6789 PT Time Calculation (min) (ACUTE ONLY): 28 min   Charges:   PT Evaluation $PT Eval Moderate Complexity: 1 Mod PT Treatments $Therapeutic Activity: 8-22 mins        Verner Kopischke A. Dan Humphreys PT, DPT Acute Rehabilitation Services Office [phone]   Viviann Spare 06/27/2022, 1:01 PM

## 2022-06-27 NOTE — Consult Note (Signed)
ORTHOPAEDIC CONSULTATION  REQUESTING PHYSICIAN: Lewie Chamber, MD  Chief Complaint: Patient presenting with sepsis and altered mental status.  HPI: Jesse [name] is a 60 y.o. male who presents with uncontrolled type 2 diabetes with severe protein caloric malnutrition.  Patient is 2 months status post debridement and tissue grafting for a wound on the plantar aspect of the right foot.  Patient presents with swelling and ulceration of the right leg with chronic venous insufficiency with concern for possible infection right foot.  Past Medical History:  Diagnosis Date   AKI (acute kidney injury) (HCC) 01/27/2014   Alcohol abuse    Anasarca    Bilateral leg edema 02/07/2021   Cellulitis    Cellulitis and abscess 01/26/2014   Cellulitis of right foot 04/17/2021   Dental abscess 01/26/2014   Diabetes mellitus type 2 in obese [facility])    Diabetic foot infection (HCC) 04/08/2021   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 2 diabetes mellitus, limited to breakdown of skin (HCC)    Facial cellulitis 01/26/2014   HTN (hypertension)    Hypokalemia 01/28/2014   Infected dental carries 01/26/2014   Leukocytosis 01/27/2014   Osteomyelitis (HCC) 04/14/2021   Right foot infection    Past Surgical History:  Procedure Laterality Date   I & D EXTREMITY Right 04/18/2021   Procedure: IRRIGATION AND DEBRIDEMENT OF FOOT;  Surgeon: Nadara Mustard, MD;  Location[facility] OR;  Service: Orthopedics;  Laterality: Right;   I & D EXTREMITY Right 04/20/2021   Procedure: EXCISIONAL DEBRIDEMENT RIGHT FOOT, APPLICATION OF SKIN GRAFT;  Surgeon: Nadara Mustard, MD;  Location: [facility] OR;  Service: Orthopedics;  Laterality: Right;   Social History   Socioeconomic History   Marital status: Single    Spouse name: Not on file   Number of children: Not on file   Years of education: 10th grade   Highest education level: Not on file  Occupational History   Occupation: Curator  Tobacco Use   Smoking status: Never    Smokeless tobacco: Never  Vaping Use   Vaping Use: Never used  Substance and Sexual Activity   Alcohol use: Yes    Alcohol/week: 2.0 standard drinks of alcohol    Types: 2 Cans of beer per week    Comment: Occasional   Drug use: No   Sexual activity: Not Currently    Partners: Female  Other Topics Concern   Not on file  Social History Narrative   Not on file   Social Determinants of Health   Financial Resource Strain: Not on file  Food Insecurity: Not on file  Transportation Needs: Not on file  Physical Activity: Not on file  Stress: Not on file  Social Connections: Not on file   No family history on file. - negative except otherwise stated in the family history section No Known Allergies Prior to Admission medications   Medication Sig Start Date End Date Taking? Authorizing Provider  atorvastatin (LIPITOR) 40 MG tablet Take 1 tablet (40 mg total) by mouth daily. Patient not taking: Reported on 06/23/2022 04/20/21   Tyrone Nine, MD  insulin glargine (LANTUS) 100 UNIT/ML Solostar Pen INJECT 10 UNITS INTO THE SKIN DAILY. Patient not taking: Reported on 06/23/2022 04/20/21   Tyrone Nine, MD  lidocaine (XYLOCAINE) 2 % solution Use as directed 15 mLs in the mouth or throat as needed for mouth pain. Patient not taking: Reported on 06/23/2022 12/09/21   Caccavale, Sophia, PA-C  losartan (  COZAAR) 25 MG tablet TAKE 1 TABLET (25 MG TOTAL) BY MOUTH DAILY. Patient not taking: Reported on 06/23/2022 04/20/21 08/17/22  Tyrone Nine, MD  silver sulfADIAZINE (SILVADENE) 1 % cream Apply 1 application. topically daily. Patient not taking: Reported on 06/23/2022 03/26/22   Adonis Huguenin, NP  glipiZIDE (GLUCOTROL) 5 MG tablet Take 1 tablet (5 mg total) by mouth daily before breakfast. Patient not taking: Reported on 01/14/2016 01/31/14 01/14/16  Christiane Ha, MD   No results found. - pertinent xrays, CT, MRI studies were reviewed and independently interpreted  Positive ROS: All other systems  have been reviewed and were otherwise negative with the exception of those mentioned in the HPI and as above.  Physical Exam: General: Alert, no acute distress Psychiatric: Patient is competent for consent with normal mood and affect Lymphatic: No axillary or cervical lymphadenopathy Cardiovascular: No pedal edema Respiratory: No cyanosis, no use of accessory musculature GI: No organomegaly, abdomen is soft and non-tender    Images:  @ENCIMAGES @  Labs:  Lab Results  Component Value Date   HGBA1C 8.3 (H) 06/20/2022   HGBA1C 10.9 (H) 04/14/2021   HGBA1C 10.5 (H) 02/07/2021   ESRSEDRATE 108 (H) 04/08/2021   ESRSEDRATE 28 (H) 01/14/2016   CRP 27.2 (H) 04/08/2021   LABURIC 7.0 01/14/2016   REPTSTATUS 06/24/2022 FINAL 06/22/2022   GRAMSTAIN NO WBC SEEN NO ORGANISMS SEEN  04/18/2021   CULT (A) 06/22/2022    <10,000 COLONIES/mL INSIGNIFICANT GROWTH Performed at [facility], [address], [place], [place]    LABORGA PROTEUS MIRABILIS 04/18/2021    Lab Results  Component Value Date   ALBUMIN 1.8 (L) 06/27/2022   ALBUMIN 1.9 (L) 06/26/2022   ALBUMIN 1.8 (L) 06/25/2022   LABURIC 7.0 01/14/2016        Latest Ref Rng & Units 06/27/2022    1:38 AM 06/26/2022    4:27 AM 06/25/2022    6:54 AM  CBC EXTENDED  WBC 4.0 - 10.5 K/uL 15.9  22.8  28.1   RBC 4.22 - 5.81 MIL/uL 3.41  3.76  3.50   Hemoglobin 13.0 - 17.0 g/dL 06/27/2022  03.5  00.9   HCT 39.0 - 52.0 % 34.0  37.4  34.2   Platelets 150 - 400 K/uL 271  253  288   NEUT# 1.7 - 7.7 K/uL 10.1  15.0    Lymph# 0.7 - 4.0 K/uL 1.7  1.9      Neurologic: Patient does not have protective sensation bilateral lower extremities.   MUSCULOSKELETAL:   Skin: Examination patient's left foot shows a chronic ulcer over the medial border of the tuft of the great toe.  Patient has no swelling in the left foot left leg venous stasis swelling is resolving and there is good wrinkling of the skin in the calf.  Patient has  palpable pulses bilaterally with ankle-brachial indices showing multiphasic flow to both lower extremities.  No arterial insufficiency.  Patient has a healing ulcer on the plantar hindfoot.  There is 1 retained staple from the tissue graft, this is an incidental finding.  There is no purulent drainage there is minimal swelling of the foot.  No redness or cellulitis in the foot.  Examination of the right calf patient has swelling venous insufficiency with fluctuation.  There is wrinkling of the skin in the calf with resolution of the swelling.  The calf is not tender to palpation.  Review of the MRI scan shows edema at the base of  the second metatarsal and middle cuneiform right foot.  This appears more consistent with Charcot changes than osteomyelitis.  Patient's white cell count has dropped from 40.6 at admission to 15.9.  Albumin 1.8.  Hemoglobin A1c 8.3.  Assessment: Assessment: Uncontrolled type 2 diabetes with severe protein caloric malnutrition with resolving cellulitis and infection right calf.  The changes in the right foot seem more consistent with Charcot arthropathy than osteomyelitis.  Patient also has chronic osteomyelitis tuft of the left great toe.  Plan: Would continue the IV antibiotics for the cellulitis right calf.  I will place an order for compression wrap to the right lower extremity.  I will follow-up in the office.  Thank you for the consult and the opportunity to see Mr. Dorthula Rue, [place] [phone] 7:15 AM

## 2022-06-27 NOTE — Consult Note (Signed)
WOC Nurse Consult Note: Patient receiving care in [facility] [phone] Reason for Consult: Profore wraps to right leg per Dr Lajoyce Corners Wound type: Ulceration on the RLE with Chronic Venous insufficiency. Two open wounds that are pink and measure 2 x 1.5 and 1 x 1 with no drainage.  Dr. Lajoyce Corners is concerned that he might have an infection and possible Charcot in the right plantar aspect of the foot. This wound is pink with hardened, darkened edges Pressure Injury POA: NA Dressing procedure/placement/frequency: Right leg is cleaned with soap and water, rinsed and Sween moisturizing lotion applied. Non-adherent gauze is placed over the open wounds.This is followed by 4 layer Profore wrap.   WOC will change on Monday if patient is not discharged. Dr. Lajoyce Corners will FU in his office.  Renaldo Reel Katrinka Blazing, MSN, RN, CMSRN, Angus Seller, [facility] Wound Treatment Associate Pager [phone]

## 2022-06-27 NOTE — Plan of Care (Signed)

## 2022-06-27 NOTE — Progress Notes (Signed)
Progress Note    Jesse [name]   JGG:[id number]  DOB: [birth year]  DOA: 06/22/2022     5 PCP: Pcp, No  Initial CC: AMS  Hospital Course: Jesse [name] is a 61 yo male with PMH DMII, noncompliance, ethanol use, hypertension and dyslipidemia who presented to the ED with altered mental status. He was found to have severe sepsis with diabetic foot ulcers, osteomyelitis, cellulitis and myositis as well as AKI.  Seen in the ED few days prior for same, left AMA then. ID and orthopedics consulted on admission.   Interval History:  No events overnight.  Patient evaluated by Jesse [name] this morning.   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 More concern this is related to Charcot arthropathy rather than OM (noted with chronic OM of left great toe as well) - patient recommended for RLE compression wrap and outpatient followup  - barriers to discharge are inadequate care for compression after discharge and unable to seek HH due to lack of insurance  - Continue daptomycin and cefepime; per ID can probably de-escalate to PO when nearing discharge - continue trending CBC in efforts to wean abx - discussed with CM given complex dispo; plan TBD   AKI on CKD3a - resolved  -Admitted with creatinine of 4.7, baseline was 1.6, around 1 year ago -Secondary to sepsis, likely ATN, resolving -Renal ultrasound without hydronephrosis, dysplastic left kidney  Type 2 diabetes mellitus -A1c is 8.3 - continue insulin regimen   Acute metabolic encephalopathy - resolved  -Secondary to severe sepsis and AKI, improving   Cholestasis of sepsis - resolved  -Secondary to sepsis, will trend    Alcohol use -No withdrawal noted, add thiamine   Hypertension -BP now stable,  antihypertensives on hold   Old records reviewed in assessment of this patient  Antimicrobials: Cefepime 8/13 >> current Dapto 8/13 >> current   DVT prophylaxis:  heparin injection 5,000 Units Start: 06/23/22 1430 SCDs Start: 06/22/22 2306   Code Status:   Code Status: Full Code  Mobility Assessment (last 72 hours)     Mobility Assessment     Row Name 06/27/22 1600 06/27/22 1252 06/27/22 0750 06/26/22 1930 06/26/22 0900   Does patient have an order for bedrest or is patient medically unstable Yes- Bedfast (Level 1) - Complete -- No - Continue assessment No - Continue assessment No - Continue assessment   What is the highest level of mobility based on the progressive mobility assessment? Level 4 (Walks with assist in room) - Balance while marching in place and cannot step forward and back - Complete Level 4 (Walks with assist in room) - Balance while marching in place and cannot step forward and back - Complete Level 1 (Bedfast) - Unable to balance while sitting on edge of bed  unable to put weight on feet d/t pain Level 1 (Bedfast) - Unable to balance while sitting on edge of bed Level 1 (Bedfast) - Unable to balance while sitting on edge of bed   Is the above level different from baseline mobility prior to current illness? Yes - Recommend PT order -- Yes - Recommend PT order Yes - Recommend PT order Yes - Recommend PT order    Row Name 06/26/22 0715 06/25/22 2058 06/25/22 0800 06/24/22 2250  Does patient have an order for bedrest or is patient medically unstable No - Continue assessment No - Continue assessment No - Continue assessment No - Continue assessment    What is the highest level of mobility based on the progressive mobility assessment? Level 2 (Chairfast) - Balance while sitting on edge of bed and cannot stand Level 2 (Chairfast) - Balance while sitting on edge of bed and cannot stand Level 2 (Chairfast) - Balance while sitting on edge of bed and cannot stand --    Is the  above level different from baseline mobility prior to current illness? Yes - Recommend PT order Yes - Recommend PT order Yes - Recommend PT order --             Barriers to discharge:  Disposition Plan:  Home Status is: Inpt  Objective: Blood pressure 127/68, pulse 87, temperature 98.4 F (36.9 C), resp. rate 14, height 6' (1.829 m), weight 107.5 kg, SpO2 99 %.  Examination:  Physical Exam Constitutional:      General: He is not in acute distress.    Appearance: Normal appearance.  HENT:     Head: Normocephalic and atraumatic.     Mouth/Throat:     Mouth: Mucous membranes are moist.  Eyes:     Extraocular Movements: Extraocular movements intact.  Cardiovascular:     Rate and Rhythm: Normal rate and regular rhythm.     Heart sounds: Normal heart sounds.  Pulmonary:     Effort: Pulmonary effort is normal. No respiratory distress.     Breath sounds: Normal breath sounds. No wheezing.  Abdominal:     General: Bowel sounds are normal. There is no distension.     Palpations: Abdomen is soft.     Tenderness: There is no abdominal tenderness.  Musculoskeletal:     Cervical back: Normal range of motion and neck supple.     Comments: Right foot edematous (chronically), minimal TTP. Left great toe chronic wound noted   Skin:    General: Skin is warm and dry.  Neurological:     General: No focal deficit present.     Mental Status: He is alert.  Psychiatric:        Mood and Affect: Mood normal.        Behavior: Behavior normal.      Consultants:  ID Orthopedic surgery  Procedures:    Data Reviewed: Results for orders placed or performed during the hospital encounter of 06/22/22 (from the past 24 hour(s))  Glucose, capillary     Status: Abnormal   Collection Time: 06/26/22  8:05 PM  Result Value Ref Range   Glucose-Capillary 152 (H) 70 - 99 mg/dL  Glucose, capillary     Status: Abnormal   Collection Time: 06/26/22 11:38 PM  Result Value Ref Range    Glucose-Capillary 114 (H) 70 - 99 mg/dL  CBC with Differential/Platelet     Status: Abnormal   Collection Time: 06/27/22  1:38 AM  Result Value Ref Range   WBC 15.9 (H) 4.0 - 10.5 K/uL   RBC 3.41 (L) 4.22 - 5.81 MIL/uL   Hemoglobin 11.7 (L) 13.0 - 17.0 g/dL   HCT 40.9 (L) 73.5 - 32.9 %   MCV 99.7 80.0 - 100.0 fL   MCH 34.3 (H) 26.0 - 34.0 pg   MCHC 34.4 30.0 - 36.0 g/dL   RDW 92.4 26.8 - 34.1 %   Platelets 271 150 - 400 K/uL   nRBC 0.0 0.0 - 0.2 %  Neutrophils Relative % 63 %   Neutro Abs 10.1 (H) 1.7 - 7.7 K/uL   Lymphocytes Relative 11 %   Lymphs Abs 1.7 0.7 - 4.0 K/uL   Monocytes Relative 13 %   Monocytes Absolute 2.1 (H) 0.1 - 1.0 K/uL   Eosinophils Relative 1 %   Eosinophils Absolute 0.1 0.0 - 0.5 K/uL   Basophils Relative 1 %   Basophils Absolute 0.1 0.0 - 0.1 K/uL   Immature Granulocytes 11 %   Abs Immature Granulocytes 1.74 (H) 0.00 - 0.07 K/uL  Comprehensive metabolic panel     Status: Abnormal   Collection Time: 06/27/22  1:38 AM  Result Value Ref Range   Sodium 138 135 - 145 mmol/L   Potassium 3.7 3.5 - 5.1 mmol/L   Chloride 112 (H) 98 - 111 mmol/L   CO2 17 (L) 22 - 32 mmol/L   Glucose, Bld 104 (H) 70 - 99 mg/dL   BUN 29 (H) 6 - 20 mg/dL   Creatinine, Ser 6.07 (H) 0.61 - 1.24 mg/dL   Calcium 8.3 (L) 8.9 - 10.3 mg/dL   Total Protein 7.4 6.5 - 8.1 g/dL   Albumin 1.8 (L) 3.5 - 5.0 g/dL   AST 36 15 - 41 U/L   ALT 33 0 - 44 U/L   Alkaline Phosphatase 155 (H) 38 - 126 U/L   Total Bilirubin 0.9 0.3 - 1.2 mg/dL   GFR, Estimated >37 >10 mL/min   Anion gap 9 5 - 15  Magnesium     Status: None   Collection Time: 06/27/22  1:38 AM  Result Value Ref Range   Magnesium 1.8 1.7 - 2.4 mg/dL  Glucose, capillary     Status: Abnormal   Collection Time: 06/27/22  4:00 AM  Result Value Ref Range   Glucose-Capillary 111 (H) 70 - 99 mg/dL  Glucose, capillary     Status: Abnormal   Collection Time: 06/27/22  7:42 AM  Result Value Ref Range   Glucose-Capillary 102 (H) 70 -  99 mg/dL  Glucose, capillary     Status: Abnormal   Collection Time: 06/27/22 11:40 AM  Result Value Ref Range   Glucose-Capillary 148 (H) 70 - 99 mg/dL  Glucose, capillary     Status: Abnormal   Collection Time: 06/27/22  3:51 PM  Result Value Ref Range   Glucose-Capillary 169 (H) 70 - 99 mg/dL    I have Reviewed nursing notes, Vitals, and Lab results since pt's last encounter. Pertinent lab results : see above I have ordered test including BMP, CBC, Mg I have reviewed the last note from staff over past 24 hours I have discussed pt's care plan and test results with nursing staff, case manager   LOS: 5 days   Lewie Chamber, MD [facility] 06/27/2022, 4:22 PM

## 2022-06-27 NOTE — Progress Notes (Addendum)
[facility] Infectious Diseases Follow Up Note  Patient Identification: Patient Name: Jesse [name] MRN: [id number] Admit Date: 06/22/2022  7:41 PM Age: 61 y.o.Today's Date: 06/27/2022  Reason for Visit: DFU/Osteomyelitis   Principal Problem:   Sepsis (HCC) Active Problems:   DM (diabetes mellitus) (HCC)   AKI (acute kidney injury) (HCC)   HTN (hypertension)   HLD (hyperlipidemia)   Osteomyelitis (HCC)   Cellulitis in diabetic foot (HCC)   Diabetic foot infection (HCC)   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)   Severe protein-calorie malnutrition (HCC)  Antibiotics:  Vancomycin 8/10-c Cefepime 8/10-c Metronidazole 8/12-c   Lines/Hardware: none    Interval Events: continues to be afebrile, WBC down trending to 15.9. Evaluated by Dr Lajoyce Corners. 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    # Leukocytosis/Sepsis 2/2 above - likely 2/2 RT leg cellulitis/ulcer with venous insufficiency ( could not be examined due to dressing), on broad spectrum abtx   # 8/10 gram variable rod in 1/2 sets  # Uncontrolled DM 2 - a1c  8  # Protein Energy malnutrition # AKI on CKD - Cr improving  # Acute encephalopathy  resolved  # Alcohol use    Recommendations Continue daptomycin, cefepime and metronidazole for rt leg cellulitis  Fu CBC trend for leukocytosis, expect to downtrend with IV abtx He would not be a safe candidate for IV abtx at home given h/o alcohol use  Fu ID of gram  variable rod.  Will consider PO antibiotics when ready to go home, likely 2 weeks course for rt leg cellulitis  BG control Adequate nutrition for wound healing Following  Rest of the management as per the primary team. Thank you for the consult. Please page with pertinent questions or concerns.  ______________________________________________________________________ Subjective patient seen and examined at the bedside.  No complaints  Vitals BP 138/77   Pulse 77   Temp 97.7 F (36.5 C) (Oral)   Resp 16   Ht 6' (1.829 m)   Wt 107.5 kg   SpO2 97%   BMI 32.14 kg/m     Physical Exam Constitutional:  lying in the bed and not in acute distress    Comments:   Cardiovascular:     Rate and Rhythm: Normal rate and regular rhythm.     Heart sounds:   Pulmonary:     Effort: Pulmonary effort is normal.     Comments:   Abdominal:     Palpations: Abdomen is soft.     Tenderness: non distended   Musculoskeletal:        General: Chronic skin changes in Left leg. RT leg is bandaged.  Left great toe wound with no acute signs of infection. Rtfoot plantar ulcer with no drainage/cellulitis.. ROM good in bilateral ankles and knee.   Neurological:     General: grossly non focal, awake, alert and oriented   Psychiatric:        Mood and Affect: Mood normal.   Pertinent Microbiology Results for orders placed or  performed during the hospital encounter of 06/22/22  Blood Culture (routine x 2)     Status: None   Collection Time: 06/22/22  8:00 PM   Specimen: BLOOD  Result Value Ref Range Status   Specimen Description BLOOD LEFT ANTECUBITAL  Final   Special Requests   Final    BOTTLES DRAWN AEROBIC AND ANAEROBIC Blood Culture results may not be optimal due to an excessive volume of blood received in culture bottles   Culture   Final    NO GROWTH 5 DAYS Performed at [facility], [address] [place], [place] [address]    Report Status 06/27/2022 FINAL  Final  Blood Culture  (routine x 2)     Status: None   Collection Time: 06/22/22  8:10 PM   Specimen: BLOOD  Result Value Ref Range Status   Specimen Description BLOOD RIGHT ANTECUBITAL  Final   Special Requests   Final    BOTTLES DRAWN AEROBIC AND ANAEROBIC Blood Culture adequate volume   Culture   Final    NO GROWTH 5 DAYS Performed at [facility], [address] [place], [place] [address]    Report Status 06/27/2022 FINAL  Final  Urine Culture     Status: Abnormal   Collection Time: 06/22/22  9:21 PM   Specimen: In/Out Cath Urine  Result Value Ref Range Status   Specimen Description IN/OUT CATH URINE  Final   Special Requests NONE  Final   Culture (A)  Final    <10,000 COLONIES/mL INSIGNIFICANT GROWTH Performed at [facility], [address] [place], [place] [address]    Report Status 06/24/2022 FINAL  Final    Pertinent Lab.    Latest Ref Rng & Units 06/27/2022    1:38 AM 06/26/2022    4:27 AM 06/25/2022    6:54 AM  CBC  WBC 4.0 - 10.5 K/uL 15.9  22.8  28.1   Hemoglobin 13.0 - 17.0 g/dL 38.3  81.8  40.3   Hematocrit 39.0 - 52.0 % 34.0  37.4  34.2   Platelets 150 - 400 K/uL 271  253  288       Latest Ref Rng & Units 06/27/2022    1:38 AM 06/26/2022    4:27 AM 06/25/2022    6:54 AM  CMP  Glucose 70 - 99 mg/dL 754  360  677   BUN 6 - 20 mg/dL 29  35  45   Creatinine 0.61 - 1.24 mg/dL 0.34  0.35  2.48   Sodium 135 - 145 mmol/L 138  136  137   Potassium 3.5 - 5.1 mmol/L 3.7  4.2  3.4   Chloride 98 - 111 mmol/L 112  109  111   CO2 22 - 32 mmol/L 17  18  17    Calcium 8.9 - 10.3 mg/dL 8.3  8.3  8.3   Total Protein 6.5 - 8.1 g/dL 7.4  7.6  6.6   Total Bilirubin 0.3 - 1.2 mg/dL 0.9  1.5  1.1   Alkaline Phos 38 - 126 U/L 155  181  178   AST 15 - 41 U/L 36  47  39   ALT 0 - 44 U/L 33  38  44      Pertinent Imaging today Plain films and CT images have been personally visualized and interpreted; radiology reports have been reviewed. Decision making incorporated into the  Impression / Recommendations.  I spent 51 minutes for this patient encounter including  review of prior medical records, coordination of care with primary/other specialist with greater than 50% of time being face to face/counseling and discussing diagnostics/treatment plan with the patient/family.  Electronically signed by:   Rosiland Oz, MD Infectious Disease Physician [facility] Pager: [phone]

## 2022-06-28 DIAGNOSIS — L03119 Cellulitis of unspecified part of limb: Secondary | ICD-10-CM

## 2022-06-28 DIAGNOSIS — E1122 Type 2 diabetes mellitus with diabetic chronic kidney disease: Secondary | ICD-10-CM

## 2022-06-28 DIAGNOSIS — D72829 Elevated white blood cell count, unspecified: Secondary | ICD-10-CM

## 2022-06-28 DIAGNOSIS — N189 Chronic kidney disease, unspecified: Secondary | ICD-10-CM

## 2022-06-28 DIAGNOSIS — L03116 Cellulitis of left lower limb: Secondary | ICD-10-CM

## 2022-06-28 DIAGNOSIS — M1460 Charcot's joint, unspecified site: Secondary | ICD-10-CM

## 2022-06-28 LAB — CBC WITH DIFFERENTIAL/PLATELET
Abs Immature Granulocytes: 0.5 10*3/uL — ABNORMAL HIGH (ref 0.00–0.07)
Basophils Absolute: 0 10*3/uL (ref 0.0–0.1)
Basophils Relative: 0 %
Eosinophils Absolute: 0.2 10*3/uL (ref 0.0–0.5)
Eosinophils Relative: 1 %
HCT: 34 % — ABNORMAL LOW (ref 39.0–52.0)
Hemoglobin: 11.2 g/dL — ABNORMAL LOW (ref 13.0–17.0)
Lymphocytes Relative: 11 %
Lymphs Abs: 1.7 10*3/uL (ref 0.7–4.0)
MCH: 34 pg (ref 26.0–34.0)
MCHC: 32.9 g/dL (ref 30.0–36.0)
MCV: 103.3 fL — ABNORMAL HIGH (ref 80.0–100.0)
Metamyelocytes Relative: 1 %
Monocytes Absolute: 0.8 10*3/uL (ref 0.1–1.0)
Monocytes Relative: 5 %
Myelocytes: 2 %
Neutro Abs: 12.6 10*3/uL — ABNORMAL HIGH (ref 1.7–7.7)
Neutrophils Relative %: 80 %
Platelets: 235 10*3/uL (ref 150–400)
RBC: 3.29 MIL/uL — ABNORMAL LOW (ref 4.22–5.81)
RDW: 15 % (ref 11.5–15.5)
WBC: 15.8 10*3/uL — ABNORMAL HIGH (ref 4.0–10.5)
nRBC: 0 % (ref 0.0–0.2)
nRBC: 0 /100 WBC

## 2022-06-28 LAB — GLUCOSE, CAPILLARY
Glucose-Capillary: 106 mg/dL — ABNORMAL HIGH (ref 70–99)
Glucose-Capillary: 124 mg/dL — ABNORMAL HIGH (ref 70–99)
Glucose-Capillary: 127 mg/dL — ABNORMAL HIGH (ref 70–99)
Glucose-Capillary: 140 mg/dL — ABNORMAL HIGH (ref 70–99)
Glucose-Capillary: 160 mg/dL — ABNORMAL HIGH (ref 70–99)
Glucose-Capillary: 94 mg/dL (ref 70–99)

## 2022-06-28 LAB — COMPREHENSIVE METABOLIC PANEL
ALT: 30 U/L (ref 0–44)
AST: 27 U/L (ref 15–41)
Albumin: 1.9 g/dL — ABNORMAL LOW (ref 3.5–5.0)
Alkaline Phosphatase: 137 U/L — ABNORMAL HIGH (ref 38–126)
Anion gap: 7 (ref 5–15)
BUN: 30 mg/dL — ABNORMAL HIGH (ref 6–20)
CO2: 16 mmol/L — ABNORMAL LOW (ref 22–32)
Calcium: 8.1 mg/dL — ABNORMAL LOW (ref 8.9–10.3)
Chloride: 112 mmol/L — ABNORMAL HIGH (ref 98–111)
Creatinine, Ser: 1.38 mg/dL — ABNORMAL HIGH (ref 0.61–1.24)
GFR, Estimated: 59 mL/min — ABNORMAL LOW (ref >60)
Glucose, Bld: 110 mg/dL — ABNORMAL HIGH (ref 70–99)
Potassium: 3.6 mmol/L (ref 3.5–5.1)
Sodium: 135 mmol/L (ref 135–145)
Total Bilirubin: 0.9 mg/dL (ref 0.3–1.2)
Total Protein: 7.4 g/dL (ref 6.5–8.1)

## 2022-06-28 LAB — PATHOLOGIST SMEAR REVIEW

## 2022-06-28 LAB — MAGNESIUM: Magnesium: 1.6 mg/dL — ABNORMAL LOW (ref 1.7–2.4)

## 2022-06-28 NOTE — Consult Note (Addendum)
WOC Nurse wound follow up [place] sent to Dr. Lajoyce Corners concerning patient removing the profore wraps. Dr. Lajoyce Corners is in a procedure this AM. WOC will FU when advised by Dr. Lajoyce Corners.   9:40: Per Dr. Lajoyce Corners. Order placed to apply Unna boot to the right LE. Wound care order placed to apply a non-adherent gauze over the open areas with a few turns of Kerlix to hold in place. If patient refuses Unna boot notify Dr. Lajoyce Corners.  Renaldo Reel Katrinka Blazing, MSN, RN, CMSRN, Angus Seller, [facility] Wound Treatment Associate Pager [phone]

## 2022-06-28 NOTE — Progress Notes (Signed)
Progress Note    Jesse [name]   EHU:[id number]  DOB: 1961-04-18  DOA: 06/22/2022     6 PCP: Pcp, No  Initial CC: AMS  Hospital Course: Jesse [name] is a 61 yo male with PMH DMII, noncompliance, ethanol use, hypertension and dyslipidemia who presented to the ED with altered mental status. He was found to have severe sepsis with diabetic foot ulcers, osteomyelitis, cellulitis and myositis as well as AKI.  Seen in the ED few days prior for same, left AMA then. ID and orthopedics consulted on admission.   Interval History:  No events overnight.  He pulled off the profore wrap overnight. He also didn't sign his medicaid form and his sister came to the hospital today to convince him and he signed it.  He's not recommended for an Unna boot but he again refused wound care this afternoon.   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 Lajoyce Corners on 8/17. More concern this is related to Charcot arthropathy rather than OM (noted with chronic OM of left great toe as well) - barriers to discharge are inadequate care for compression after discharge and unable to seek HH due to lack of insurance  - Continue daptomycin and cefepime; per ID can probably de-escalate to PO when nearing discharge - discussed with CM given complex dispo; plan TBD  - patient now refusing LLE wound care it seems; not sure what the end goal is here anymore  AKI on CKD3a - resolved  -Admitted with creatinine of 4.7, baseline was 1.6, around 1 year ago -Secondary to sepsis, likely ATN, resolving -Renal ultrasound without hydronephrosis, dysplastic left kidney  Type 2 diabetes mellitus -A1c is 8.3 - continue insulin regimen   Acute metabolic encephalopathy - resolved   -Secondary to severe sepsis and AKI, improving   Cholestasis of sepsis - resolved  -Secondary to sepsis, will trend    Alcohol use -No withdrawal noted, add thiamine   Hypertension -BP now stable, antihypertensives on hold   Old records reviewed in assessment of this patient  Antimicrobials: Cefepime 8/13 >> current Dapto 8/13 >> current   DVT prophylaxis:  heparin injection 5,000 Units Start: 06/23/22 1430 SCDs Start: 06/22/22 2306   Code Status:   Code Status: Full Code  Mobility Assessment (last 72 hours)     Mobility Assessment     Row Name 06/28/22 1100 06/28/22 1030 06/27/22 1600 06/27/22 1252 06/27/22 0750   Does patient have an order for bedrest or is patient medically unstable -- No - Continue assessment Yes- Bedfast (Level 1) - Complete -- No - Continue assessment   What is the highest level of mobility based on the progressive mobility assessment? Level 4 (Walks with assist in room) - Balance while marching in place and cannot step forward and back - Complete Level 4 (Walks with assist in room) - Balance while marching in place and cannot step forward and back - Complete Level 4 (Walks with assist in room) - Balance while marching in place and cannot step forward and back - Complete Level 4 (Walks with assist in room) - Balance while marching in place and cannot step forward and back - Complete Level 1 (Bedfast) - Unable to balance while sitting on edge of bed  unable to put  weight on feet d/t pain   Is the above level different from baseline mobility prior to current illness? -- Yes - Recommend PT order Yes - Recommend PT order -- Yes - Recommend PT order    Row Name 06/26/22 1930 06/26/22 0900 06/26/22 0715 06/25/22 2058     Does patient have an order for bedrest or is patient medically unstable No - Continue assessment No - Continue assessment No - Continue assessment No - Continue assessment    What is the highest level of mobility based on the progressive  mobility assessment? Level 1 (Bedfast) - Unable to balance while sitting on edge of bed Level 1 (Bedfast) - Unable to balance while sitting on edge of bed Level 2 (Chairfast) - Balance while sitting on edge of bed and cannot stand Level 2 (Chairfast) - Balance while sitting on edge of bed and cannot stand    Is the above level different from baseline mobility prior to current illness? Yes - Recommend PT order Yes - Recommend PT order Yes - Recommend PT order Yes - Recommend PT order             Barriers to discharge:  Disposition Plan:  Home Status is: Inpt  Objective: Blood pressure 136/83, pulse 87, temperature 98.2 F (36.8 C), temperature source Oral, resp. rate 18, height 6' (1.829 m), weight 107.5 kg, SpO2 98 %.  Examination:  Physical Exam Constitutional:      General: He is not in acute distress.    Appearance: Normal appearance.  HENT:     Head: Normocephalic and atraumatic.     Mouth/Throat:     Mouth: Mucous membranes are moist.  Eyes:     Extraocular Movements: Extraocular movements intact.  Cardiovascular:     Rate and Rhythm: Normal rate and regular rhythm.     Heart sounds: Normal heart sounds.  Pulmonary:     Effort: Pulmonary effort is normal. No respiratory distress.     Breath sounds: Normal breath sounds. No wheezing.  Abdominal:     General: Bowel sounds are normal. There is no distension.     Palpations: Abdomen is soft.     Tenderness: There is no abdominal tenderness.  Musculoskeletal:     Cervical back: Normal range of motion and neck supple.     Comments: Right foot edematous (chronically), minimal TTP. Left great toe chronic wound noted   Skin:    General: Skin is warm and dry.  Neurological:     General: No focal deficit present.     Mental Status: He is alert.  Psychiatric:        Mood and Affect: Mood normal.        Behavior: Behavior normal.      Consultants:  ID Orthopedic surgery  Procedures:    Data Reviewed: Results for  orders placed or performed during the hospital encounter of 06/22/22 (from the past 24 hour(s))  Sedimentation rate     Status: Abnormal   Collection Time: 06/27/22  4:50 PM  Result Value Ref Range   Sed Rate 107 (H) 0 - 16 mm/hr  Glucose, capillary     Status: Abnormal   Collection Time: 06/27/22  7:47 PM  Result Value Ref Range   Glucose-Capillary 143 (H) 70 - 99 mg/dL  Glucose, capillary     Status: Abnormal   Collection Time: 06/28/22 12:24 AM  Result Value Ref Range   Glucose-Capillary 124 (H) 70 - 99 mg/dL  CBC with Differential/Platelet  Status: Abnormal   Collection Time: 06/28/22  2:29 AM  Result Value Ref Range   WBC 15.8 (H) 4.0 - 10.5 K/uL   RBC 3.29 (L) 4.22 - 5.81 MIL/uL   Hemoglobin 11.2 (L) 13.0 - 17.0 g/dL   HCT 30.1 (L) 60.1 - 09.3 %   MCV 103.3 (H) 80.0 - 100.0 fL   MCH 34.0 26.0 - 34.0 pg   MCHC 32.9 30.0 - 36.0 g/dL   RDW 23.5 57.3 - 22.0 %   Platelets 235 150 - 400 K/uL   nRBC 0.0 0.0 - 0.2 %   Neutrophils Relative % 80 %   Neutro Abs 12.6 (H) 1.7 - 7.7 K/uL   Lymphocytes Relative 11 %   Lymphs Abs 1.7 0.7 - 4.0 K/uL   Monocytes Relative 5 %   Monocytes Absolute 0.8 0.1 - 1.0 K/uL   Eosinophils Relative 1 %   Eosinophils Absolute 0.2 0.0 - 0.5 K/uL   Basophils Relative 0 %   Basophils Absolute 0.0 0.0 - 0.1 K/uL   WBC Morphology See Note    nRBC 0 0 /100 WBC   Metamyelocytes Relative 1 %   Myelocytes 2 %   Abs Immature Granulocytes 0.50 (H) 0.00 - 0.07 K/uL  Comprehensive metabolic panel     Status: Abnormal   Collection Time: 06/28/22  2:29 AM  Result Value Ref Range   Sodium 135 135 - 145 mmol/L   Potassium 3.6 3.5 - 5.1 mmol/L   Chloride 112 (H) 98 - 111 mmol/L   CO2 16 (L) 22 - 32 mmol/L   Glucose, Bld 110 (H) 70 - 99 mg/dL   BUN 30 (H) 6 - 20 mg/dL   Creatinine, Ser 2.54 (H) 0.61 - 1.24 mg/dL   Calcium 8.1 (L) 8.9 - 10.3 mg/dL   Total Protein 7.4 6.5 - 8.1 g/dL   Albumin 1.9 (L) 3.5 - 5.0 g/dL   AST 27 15 - 41 U/L   ALT 30 0 - 44  U/L   Alkaline Phosphatase 137 (H) 38 - 126 U/L   Total Bilirubin 0.9 0.3 - 1.2 mg/dL   GFR, Estimated 59 (L) >60 mL/min   Anion gap 7 5 - 15  Magnesium     Status: Abnormal   Collection Time: 06/28/22  2:29 AM  Result Value Ref Range   Magnesium 1.6 (L) 1.7 - 2.4 mg/dL  Glucose, capillary     Status: None   Collection Time: 06/28/22  4:59 AM  Result Value Ref Range   Glucose-Capillary 94 70 - 99 mg/dL  Glucose, capillary     Status: Abnormal   Collection Time: 06/28/22  7:40 AM  Result Value Ref Range   Glucose-Capillary 106 (H) 70 - 99 mg/dL  Glucose, capillary     Status: Abnormal   Collection Time: 06/28/22 12:09 PM  Result Value Ref Range   Glucose-Capillary 127 (H) 70 - 99 mg/dL  Glucose, capillary     Status: Abnormal   Collection Time: 06/28/22  4:29 PM  Result Value Ref Range   Glucose-Capillary 140 (H) 70 - 99 mg/dL    I have Reviewed nursing notes, Vitals, and Lab results since pt's last encounter. Pertinent lab results : see above I have ordered test including BMP, CBC, Mg I have reviewed the last note from staff over past 24 hours I have discussed pt's care plan and test results with nursing staff, case manager   LOS: 6 days   Lewie Chamber, MD [facility] 06/28/2022, 4:40  PM

## 2022-06-28 NOTE — Progress Notes (Signed)
CSW spoke with pt sister [name] by phone.  She is requesting help with medicaid application.  CSW email with Lowella Fairy Patterson/financial counseling that pt has been screened previously, referred to [employer] [employer]. [email or web address].  Per [place], they are attempting to file medicaid application but pt has been refusing to sign the forms.  [employer] will reach out to pt sisters for assistance in getting this completed. Daleen Squibb, MSW, LCSW 8/18/20238:23 AM

## 2022-06-28 NOTE — Evaluation (Signed)
Occupational Therapy Evaluation Patient Details Name: Jesse [name] MRN: [id number] DOB: 1961-01-18 Today's Date: 06/28/2022   History of Present Illness 61 y/o male presented to ED on 06/22/22 for AMS and possible sepsis. Thumb size hole in R foot after stepping on nail 6 months ago noted in ED. MRI showed edema at base of second metatarsal consistent more with Charcot changes than osteomyelitis. Admitted for sepsis and R calf cellulitis. PMH: T2DM, HTN, alcohol abuse   Clinical Impression   PTA, pt lives alone, reports typically Independent with all daily tasks without AD and working as a Curator. Pt presents now with deficits in cognition, dynamic standing balance and activity tolerance. Pt able to mobilize in room using RW on a min guard basis and Supervision-Min A for LB ADLs. Pt noted with some difficulty problem solving/sequencing basic ADLs but able to complete with min cues. Also question health literacy d/t decreased understanding of DM and complications from DM. Feel pt would be able to DC home with increased cognitive support (assist with meds, meals and transportation).       Recommendations for follow up therapy are one component of a multi-disciplinary discharge planning process, led by the attending physician.  Recommendations may be updated based on patient status, additional functional criteria and insurance authorization.   Follow Up Recommendations  Home health OT    Assistance Recommended at Discharge Frequent or constant Supervision/Assistance  Patient can return home with the following Assistance with cooking/housework;Direct supervision/assist for financial management;Direct supervision/assist for medications management;Assist for transportation    Functional Status Assessment  Patient has had a recent decline in their functional status and demonstrates the ability to make significant improvements in function in a reasonable and predictable amount of time.  Equipment  Recommendations  Other (comment) (RW)    Recommendations for Other Services       Precautions / Restrictions Precautions Precautions: Fall Precaution Comments: B LE wounds Restrictions Weight Bearing Restrictions: No      Mobility Bed Mobility Overal bed mobility: Modified Independent                  Transfers Overall transfer level: Needs assistance Equipment used: Rolling walker (2 wheels) Transfers: Sit to/from Stand Sit to Stand: Supervision                  Balance Overall balance assessment: Needs assistance Sitting-balance support: No upper extremity supported, Feet supported Sitting balance-Leahy Scale: Good     Standing balance support: Bilateral upper extremity supported, Reliant on assistive device for balance Standing balance-Leahy Scale: Fair Standing balance comment: able to stand at sink without support                           ADL either performed or assessed with clinical judgement   ADL Overall ADL's : Needs assistance/impaired Eating/Feeding: Independent   Grooming: Supervision/safety;Standing;Oral care Grooming Details (indicate cue type and reason): did need cues to problem solve squeezing toothpaste onto toothbrush as pt unable to complete initially and attempting to place cap back on tube w/o placing toothpaste on toothbrush Upper Body Bathing: Set up;Sitting   Lower Body Bathing: Supervison/ safety;Sit to/from stand   Upper Body Dressing : Set up;Sitting   Lower Body Dressing: Minimal assistance;Sit to/from stand Lower Body Dressing Details (indicate cue type and reason): sock mgmt Toilet Transfer: Min guard;Ambulation;Rolling walker (2 wheels) Toilet Transfer Details (indicate cue type and reason): though opted to use urinal at bedside when  offered mobility to toilet Toileting- Clothing Manipulation and Hygiene: Supervision/safety;Sitting/lateral lean;Sit to/from stand Toileting - Clothing Manipulation Details  (indicate cue type and reason): urinal use EOB     Functional mobility during ADLs: Min guard;Rolling walker (2 wheels) General ADL Comments: Pt presents with questionable cognitive deficits vs questionable health literacy. Physically moving fairly well     Vision Ability to See in Adequate Light: 0 Adequate Patient Visual Report: No change from baseline Vision Assessment?: No apparent visual deficits     Perception     Praxis      Pertinent Vitals/Pain Pain Assessment Pain Assessment: No/denies pain     Hand Dominance Right   Extremity/Trunk Assessment Upper Extremity Assessment Upper Extremity Assessment: Overall WFL for tasks assessed   Lower Extremity Assessment Lower Extremity Assessment: Defer to PT evaluation   Cervical / Trunk Assessment Cervical / Trunk Assessment: Normal   Communication Communication Communication: No difficulties   Cognition Arousal/Alertness: Awake/alert Behavior During Therapy: Flat affect Overall Cognitive Status: Impaired/Different from baseline Area of Impairment: Attention, Memory, Safety/judgement, Awareness, Problem solving                   Current Attention Level: Sustained Memory: Decreased short-term memory   Safety/Judgement: Decreased awareness of deficits, Decreased awareness of safety Awareness: Intellectual Problem Solving: Decreased initiation, Difficulty sequencing, Requires verbal cues, Requires tactile cues, Slow processing General Comments: A&Ox4 but delayed responses, difficulty problem solving tasks and initiating. initiated SBT though unable to fully complete d/t NT vitals check. Discussed meds at home with pt variable responses on checking blood sugar, insulin mgmt w/ questionable health literacy     General Comments       Exercises     Shoulder Instructions      Home Living Family/patient expects to be discharged to:: Private residence Living Arrangements: Alone Available Help at Discharge:  Family;Available PRN/intermittently Type of Home: House Home Access: Level entry Entrance Stairs-Number of Steps: 2 Entrance Stairs-Rails: None Home Layout: One level     Bathroom Shower/Tub: Producer, television/film/video: Standard     Home Equipment: None          Prior Functioning/Environment Prior Level of Function : Independent/Modified Independent             Mobility Comments: no use of AD typically ADLs 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 Problem List: Decreased activity tolerance;Impaired balance (sitting and/or standing);Decreased cognition;Decreased knowledge of use of DME or AE      OT Treatment/Interventions: Self-care/ADL training;Therapeutic exercise;Energy conservation;DME and/or AE instruction;Therapeutic activities;Patient/family education;Balance training    OT Goals(Current goals can be found in the care plan section) Acute Rehab OT Goals Patient Stated Goal: pt wondering about DM care at home OT Goal Formulation: With patient Time For Goal Achievement: 07/12/22 Potential to Achieve Goals: Good  OT Frequency: Min 2X/week    Co-evaluation              AM-PAC OT "6 Clicks" Daily Activity     Outcome Measure Help from another person eating meals?: None Help from another person taking care of personal grooming?: A Little Help from another person toileting, which includes using toliet, bedpan, or urinal?: A Little Help from another person bathing (including washing, rinsing, drying)?: A Little Help from another person to put on and taking off regular upper body clothing?: A Little Help from another person to put on and taking off regular lower body clothing?: A Little 6 Click  Score: 19   End of Session Equipment Utilized During Treatment: Rolling walker (2 wheels);Gait belt Nurse Communication: Mobility status;Other (comment) (discussed with NT)  Activity Tolerance: Patient tolerated treatment well Patient left: in bed;with bed  alarm set;with call bell/phone within reach  OT Visit Diagnosis: Other abnormalities of gait and mobility (R26.89);Unsteadiness on feet (R26.81)                Time: 6834-1962 OT Time Calculation (min): 33 min Charges:  OT General Charges $OT Visit: 1 Visit OT Evaluation $OT Eval Low Complexity: 1 Low OT Treatments $Self Care/Home Management : 8-22 mins  Bradd Canary, OTR/L Acute Rehab Services Office: [phone]   Lorre Munroe 06/28/2022, 12:33 PM

## 2022-06-28 NOTE — Progress Notes (Signed)
Notified ortho tech of unna boot order- they report none available tonight will have tomorrow- they will make a note for day shift

## 2022-06-28 NOTE — Progress Notes (Signed)
Patient refused wound care stated he would get it done later

## 2022-06-28 NOTE — Progress Notes (Signed)
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    # Leukocytosis/Sepsis 2/2 above - likely 2/2 RT leg cellulitis/ulcer with venous insufficiency , on broad spectrum abtx , see media  # 8/10 gram variable rod in 1/2 sets  # Uncontrolled DM 2 - a1c  8  # Protein Energy malnutrition # AKI on CKD - Cr stable improving  # Alcohol use    Recommendations Continue daptomycin, cefepime and metronidazole for rt leg cellulitis with venous ulcer  Fu CBC trend for leukocytosis, expect to downtrend with IV abtx over the weekend.  Fu ID of gram variable rod. Has been sent to [facility] for ID and expect to be back in 1-2 days Will consider PO antibiotics when ready to go home, likely 2 weeks course for rt leg cellulitis early next week if clinically doing well with  leukocytosis downtrending BG control Adequate nutrition for wound healing Fu Ortho recs  Dr Earlene Plater covering for me this weekend. I am back Monday.   Rest of the management as per the primary team. Thank you for the consult. Please page with pertinent questions or concerns.  ______________________________________________________________________ Subjective patient seen and examined at the bedside.  No complaints  Vitals BP 136/79 (BP Location: Right Arm)   Pulse 84   Temp 98.6 F (37 C) (Oral)   Resp 20   Ht 6' (1.829 m)   Wt 107.5 kg   SpO2 98%   BMI 32.14 kg/m     Physical Exam Constitutional:  lying in the bed and not in acute distress    Comments:   Cardiovascular:     Rate and Rhythm: Normal rate and regular rhythm.     Heart sounds:   Pulmonary:     Effort: Pulmonary effort is normal.     Comments:   Abdominal:     Palpations: Abdomen is soft.     Tenderness: non distended   Musculoskeletal:        General:  Left great toe wound with no acute signs of infection. Rtfoot plantar ulcer with no drainage/cellulitis.. ROM good in bilateral ankles and knee.  Chronic skin changes in Left leg. Rt leg with open venous ulcer  No crepitus and fluctuance in bilateral calves     Neurological:     General: grossly non focal, awake, alert and oriented   Psychiatric:  Mood and Affect: Mood normal.   Pertinent Microbiology Results for orders placed or performed during the hospital encounter of 06/22/22  Blood Culture (routine x 2)     Status: None   Collection Time: 06/22/22  8:00 PM   Specimen: BLOOD  Result Value Ref Range Status   Specimen Description BLOOD LEFT ANTECUBITAL  Final   Special Requests   Final    BOTTLES DRAWN AEROBIC AND ANAEROBIC Blood Culture results may not be optimal due to an excessive volume of blood received in culture bottles   Culture   Final    NO GROWTH 5 DAYS Performed at [facility], [address], [place],  [place] [address]    Report Status 06/27/2022 FINAL  Final  Blood Culture (routine x 2)     Status: None   Collection Time: 06/22/22  8:10 PM   Specimen: BLOOD  Result Value Ref Range Status   Specimen Description BLOOD RIGHT ANTECUBITAL  Final   Special Requests   Final    BOTTLES DRAWN AEROBIC AND ANAEROBIC Blood Culture adequate volume   Culture   Final    NO GROWTH 5 DAYS Performed at [facility], [address] [place], [place] [address]    Report Status 06/27/2022 FINAL  Final  Urine Culture     Status: Abnormal   Collection Time: 06/22/22  9:21 PM   Specimen: In/Out Cath Urine  Result Value Ref Range Status   Specimen Description IN/OUT CATH URINE  Final   Special Requests NONE  Final   Culture (A)  Final    <10,000 COLONIES/mL INSIGNIFICANT GROWTH Performed at [facility], [address], [place], [place] [address]    Report Status 06/24/2022 FINAL  Final    Pertinent Lab.    Latest Ref Rng & Units 06/28/2022    2:29 AM 06/27/2022    1:38 AM 06/26/2022    4:27 AM  CBC  WBC 4.0 - 10.5 K/uL 15.8  15.9  22.8   Hemoglobin 13.0 - 17.0 g/dL 81.8  56.3  14.9   Hematocrit 39.0 - 52.0 % 34.0  34.0  37.4   Platelets 150 - 400 K/uL 235  271  253       Latest Ref Rng & Units 06/28/2022    2:29 AM 06/27/2022    1:38 AM 06/26/2022    4:27 AM  CMP  Glucose 70 - 99 mg/dL 702  637  858   BUN 6 - 20 mg/dL 30  29  35   Creatinine 0.61 - 1.24 mg/dL 8.50  2.77  4.12   Sodium 135 - 145 mmol/L 135  138  136   Potassium 3.5 - 5.1 mmol/L 3.6  3.7  4.2   Chloride 98 - 111 mmol/L 112  112  109   CO2 22 - 32 mmol/L 16  17  18    Calcium 8.9 - 10.3 mg/dL 8.1  8.3  8.3   Total Protein 6.5 - 8.1 g/dL 7.4  7.4  7.6   Total Bilirubin 0.3 - 1.2 mg/dL 0.9  0.9  1.5   Alkaline Phos 38 - 126 U/L 137  155  181   AST 15 - 41 U/L 27  36  47   ALT 0 - 44 U/L 30  33  38      Pertinent Imaging today Plain films and CT images have been personally visualized and interpreted; radiology  reports have been reviewed. Decision making incorporated into  the Impression / Recommendations.  No results found.   I spent 51 minutes for this patient encounter including review of prior medical records, coordination of care with primary/other specialist with greater than 50% of time being face to face/counseling and discussing diagnostics/treatment plan with the patient/family.  Electronically signed by:   Odette Fraction, MD Infectious Disease Physician [facility] for Infectious Disease Pager: [phone]

## 2022-06-28 NOTE — Progress Notes (Signed)
Physical Therapy Treatment Patient Details Name: Jesse [name] MRN: [id number] DOB: 1961-04-05 Today's Date: 06/28/2022   History of Present Illness 61 y/o male presented to ED on 06/22/22 for AMS and possible sepsis. Thumb size hole in R foot after stepping on nail 6 months ago noted in ED. MRI showed edema at base of second metatarsal consistent more with Charcot changes than osteomyelitis. Admitted for sepsis and R calf cellulitis. PMH: T2DM, HTN, alcohol abuse    PT Comments    On arrival, patient unaware of bowel incontinence. Required assist for pericare but able to roll L/R modI. Patient required min guard for sit to stand and able to progress ambulation distance this date with use of RW and min guard. Complaining of pain in R LE with increased mobility. Patient remains confused and with delayed responses. Family will need to provide frequent supervision due to cognitive deficits. D/c plan remains appropriate. If family unable to provide necessary assistance, may need SNF placement.     Recommendations for follow up therapy are one component of a multi-disciplinary discharge planning process, led by the attending physician.  Recommendations may be updated based on patient status, additional functional criteria and insurance authorization.  Follow Up Recommendations  Home health PT     Assistance Recommended at Discharge Frequent or constant Supervision/Assistance  Patient can return home with the following A little help with walking and/or transfers;A little help with bathing/dressing/bathroom;Assistance with cooking/housework;Direct supervision/assist for medications management;Direct supervision/assist for financial management;Assist for transportation   Equipment Recommendations  Rolling Halvor Behrend (2 wheels)    Recommendations for Other Services       Precautions / Restrictions Precautions Precautions: Fall Precaution Comments: B LE wounds Restrictions Weight Bearing  Restrictions: No     Mobility  Bed Mobility Overal bed mobility: Modified Independent             General bed mobility comments: increased time. Able to roll L/R for pericare due to bowel incontinence    Transfers Overall transfer level: Needs assistance Equipment used: Rolling Arlander Gillen (2 wheels) Transfers: Sit to/from Stand Sit to Stand: Min guard           General transfer comment: cues for hand placement    Ambulation/Gait Ambulation/Gait assistance: Min guard Gait Distance (Feet): 60 Feet Assistive device: Rolling Blaiden Werth (2 wheels) Gait Pattern/deviations: Step-through pattern, Decreased stride length Gait velocity: decreased     General Gait Details: min guard for safety. Intermittently dragging R foot. Patient complaining of slight pain in R LE during mobility   Stairs             Wheelchair Mobility    Modified Rankin (Stroke Patients Only)       Balance Overall balance assessment: Needs assistance Sitting-balance support: No upper extremity supported, Feet supported Sitting balance-Leahy Scale: Good     Standing balance support: Bilateral upper extremity supported, Reliant on assistive device for balance Standing balance-Leahy Scale: Fair                              Cognition Arousal/Alertness: Awake/alert Behavior During Therapy: Flat affect Overall Cognitive Status: Impaired/Different from baseline Area of Impairment: Attention, Memory, Safety/judgement, Awareness, Problem solving                   Current Attention Level: Sustained Memory: Decreased short-term memory   Safety/Judgement: Decreased awareness of deficits, Decreased awareness of safety Awareness: Intellectual Problem Solving: Decreased initiation, Difficulty sequencing, Requires  verbal cues, Requires tactile cues, Slow processing General Comments: delayed responses remains. Unaware of bowel movement in the bed.        Exercises      General  Comments        Pertinent Vitals/Pain Pain Assessment Pain Assessment: Faces Faces Pain Scale: Hurts little more Pain Location: R LE Pain Descriptors / Indicators: Aching, Grimacing Pain Intervention(s): Limited activity within patient's tolerance, Monitored during session, Repositioned    Home Living                          Prior Function            PT Goals (current goals can now be found in the care plan section) Acute Rehab PT Goals Patient Stated Goal: did not state PT Goal Formulation: With family Time For Goal Achievement: 07/11/22 Potential to Achieve Goals: Good Progress towards PT goals: Progressing toward goals    Frequency    Min 3X/week      PT Plan Current plan remains appropriate    Co-evaluation              AM-PAC PT "6 Clicks" Mobility   Outcome Measure  Help needed turning from your back to your side while in a flat bed without using bedrails?: None Help needed moving from lying on your back to sitting on the side of a flat bed without using bedrails?: None Help needed moving to and from a bed to a chair (including a wheelchair)?: A Little Help needed standing up from a chair using your arms (e.g., wheelchair or bedside chair)?: A Little Help needed to walk in hospital room?: A Little Help needed climbing 3-5 steps with a railing? : A Lot 6 Click Score: 19    End of Session   Activity Tolerance: Patient tolerated treatment well Patient left: in chair;with call bell/phone within reach;with chair alarm set Nurse Communication: Mobility status PT Visit Diagnosis: Unsteadiness on feet (R26.81);Muscle weakness (generalized) (M62.81);Difficulty in walking, not elsewhere classified (R26.2)     Time: 1245-8099 PT Time Calculation (min) (ACUTE ONLY): 32 min  Charges:  $Gait Training: 8-22 mins $Therapeutic Activity: 8-22 mins                     Jaquane Boughner A. Dan Humphreys PT, DPT Acute Rehabilitation Services Office  [phone]    Viviann Spare 06/28/2022, 5:00 PM

## 2022-06-28 NOTE — Plan of Care (Signed)
  Problem: Fluid Volume: Goal: Hemodynamic stability will improve Outcome: Progressing   Problem: Clinical Measurements: Goal: Diagnostic test results will improve Outcome: Progressing Goal: Signs and symptoms of infection will decrease Outcome: Progressing   Problem: Respiratory: Goal: Ability to maintain adequate ventilation will improve Outcome: Progressing   Problem: Elimination: Goal: Will not experience complications related to bowel motility Outcome: Progressing Goal: Will not experience complications related to urinary retention Outcome: Progressing

## 2022-06-28 NOTE — Progress Notes (Signed)
Pt. Ripped his LLE dressing down and insisted this RN finish removing. Pt. C/o severe pain to area and reports that removing the dressing is all that will help. Pt. Refuses pain medication. Nurse reinforced to patient that this dressing is what the the doctor says he needs to keep on his leg, but patient still insists. This RN assisted Pt. in removing the rest of the dressing and reapplied telfa and secured with ace wrap. Consult to wound care placed, with request for wound care nurse to come see patient in morning.

## 2022-06-29 DIAGNOSIS — M1460 Charcot's joint, unspecified site: Secondary | ICD-10-CM

## 2022-06-29 LAB — COMPREHENSIVE METABOLIC PANEL
ALT: 24 U/L (ref 0–44)
AST: 25 U/L (ref 15–41)
Albumin: 1.9 g/dL — ABNORMAL LOW (ref 3.5–5.0)
Alkaline Phosphatase: 131 U/L — ABNORMAL HIGH (ref 38–126)
Anion gap: 7 (ref 5–15)
BUN: 24 mg/dL — ABNORMAL HIGH (ref 6–20)
CO2: 17 mmol/L — ABNORMAL LOW (ref 22–32)
Calcium: 8.5 mg/dL — ABNORMAL LOW (ref 8.9–10.3)
Chloride: 112 mmol/L — ABNORMAL HIGH (ref 98–111)
Creatinine, Ser: 1.22 mg/dL (ref 0.61–1.24)
GFR, Estimated: >60 mL/min (ref >60)
Glucose, Bld: 96 mg/dL (ref 70–99)
Potassium: 3.9 mmol/L (ref 3.5–5.1)
Sodium: 136 mmol/L (ref 135–145)
Total Bilirubin: 0.5 mg/dL (ref 0.3–1.2)
Total Protein: 7.8 g/dL (ref 6.5–8.1)

## 2022-06-29 LAB — CBC WITH DIFFERENTIAL/PLATELET
Abs Immature Granulocytes: 0.66 10*3/uL — ABNORMAL HIGH (ref 0.00–0.07)
Basophils Absolute: 0.1 10*3/uL (ref 0.0–0.1)
Basophils Relative: 1 %
Eosinophils Absolute: 0.1 10*3/uL (ref 0.0–0.5)
Eosinophils Relative: 1 %
HCT: 33.6 % — ABNORMAL LOW (ref 39.0–52.0)
Hemoglobin: 11.2 g/dL — ABNORMAL LOW (ref 13.0–17.0)
Immature Granulocytes: 4 %
Lymphocytes Relative: 17 %
Lymphs Abs: 2.7 10*3/uL (ref 0.7–4.0)
MCH: 34.4 pg — ABNORMAL HIGH (ref 26.0–34.0)
MCHC: 33.3 g/dL (ref 30.0–36.0)
MCV: 103.1 fL — ABNORMAL HIGH (ref 80.0–100.0)
Monocytes Absolute: 1.6 10*3/uL — ABNORMAL HIGH (ref 0.1–1.0)
Monocytes Relative: 10 %
Neutro Abs: 11 10*3/uL — ABNORMAL HIGH (ref 1.7–7.7)
Neutrophils Relative %: 67 %
Platelets: 244 10*3/uL (ref 150–400)
RBC: 3.26 MIL/uL — ABNORMAL LOW (ref 4.22–5.81)
RDW: 14.6 % (ref 11.5–15.5)
WBC: 16.1 10*3/uL — ABNORMAL HIGH (ref 4.0–10.5)
nRBC: 0 % (ref 0.0–0.2)

## 2022-06-29 LAB — MAGNESIUM: Magnesium: 1.6 mg/dL — ABNORMAL LOW (ref 1.7–2.4)

## 2022-06-29 LAB — GLUCOSE, CAPILLARY
Glucose-Capillary: 103 mg/dL — ABNORMAL HIGH (ref 70–99)
Glucose-Capillary: 125 mg/dL — ABNORMAL HIGH (ref 70–99)
Glucose-Capillary: 194 mg/dL — ABNORMAL HIGH (ref 70–99)
Glucose-Capillary: 76 mg/dL (ref 70–99)
Glucose-Capillary: 93 mg/dL (ref 70–99)
Glucose-Capillary: 95 mg/dL (ref 70–99)

## 2022-06-29 MED ORDER — MAGNESIUM SULFATE 2 GM/50ML IV SOLN
2.0000 g | Freq: Once | INTRAVENOUS | Status: AC
  Administered 2022-06-29: 2 g via INTRAVENOUS
  Filled 2022-06-29: qty 50

## 2022-06-29 NOTE — Progress Notes (Signed)
Orthopedic Tech Progress Note Patient Details:  Jesse [name] [birth year] [id number]  Received call for unna boots. Currently still waiting to get unna boot supplies in stock and will apply once we have materials available.   Patient ID: Jesse [name], male   DOB: [birth year], 61 y.o.   MRN: [id number]  Docia Furl 06/29/2022, 9:23 AM

## 2022-06-29 NOTE — Progress Notes (Signed)
Progress Note    Jesse [name]   HCW:[id number]  DOB: [birth year]  DOA: 06/22/2022     7 PCP: Pcp, No  Initial CC: AMS  Hospital Course: Jesse [name] is a 61 yo male with PMH DMII, noncompliance, ethanol use, hypertension and dyslipidemia who presented to the ED with altered mental status. He was found to have severe sepsis with diabetic foot ulcers, osteomyelitis, cellulitis and myositis as well as AKI.  Seen in the ED few days prior for same, left AMA then. ID and orthopedics consulted on admission.   Interval History:  No events overnight.  Still awaiting unna boot wrap. He told me he had a wrap on his leg this morning, then when I pulled the cover off, there was no leg wrap. I pointed it out to him and he seemed surprised there was nothing on his leg.   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 Lajoyce Corners on 8/17. More concern this is related to Charcot arthropathy rather than OM (noted with chronic OM of left great toe as well) - barriers to discharge are inadequate care for compression after discharge and unable to seek HH due to lack of insurance  - Continue daptomycin and cefepime; per ID can probably de-escalate to PO when nearing discharge - discussed with CM given complex dispo; plan TBD  - awaiting LLE unna boot and then we'll have to see if he even allows it on  GVR in blood culture - 1/4 bottles from 8/10 noted to have GVR; has been sent off for culture ID to [facility] - ID following as well - suspect it'll be a contaminate but will await results  AKI on CKD3a - resolved  -Admitted with creatinine of 4.7, baseline was 1.6, around 1 year ago -Secondary to sepsis, likely ATN, resolving -Renal ultrasound without  hydronephrosis, dysplastic left kidney  Type 2 diabetes mellitus -A1c is 8.3 - continue insulin regimen   Acute metabolic encephalopathy - resolved  -Secondary to severe sepsis and AKI, improving   Cholestasis of sepsis - resolved  -Secondary to sepsis, will trend    Alcohol use -No withdrawal noted, add thiamine   Hypertension -BP now stable, antihypertensives on hold   Old records reviewed in assessment of this patient  Antimicrobials: Cefepime 8/13 >> current Dapto 8/13 >> current   DVT prophylaxis:  heparin injection 5,000 Units Start: 06/23/22 1430 SCDs Start: 06/22/22 2306   Code Status:   Code Status: Full Code  Mobility Assessment (last 72 hours)     Mobility Assessment     Row Name 06/28/22 1655 06/28/22 1100 06/28/22 1030 06/27/22 1600 06/27/22 1252   Does patient have an order for bedrest or is patient medically unstable -- -- No - Continue assessment Yes- Bedfast (Level 1) - Complete --   What is the highest level of mobility based on the progressive mobility assessment? Level 5 (Walks with assist in room/hall) - Balance while stepping forward/back and can walk in room with assist - Complete Level 4 (Walks with assist in room) - Balance while marching in place and cannot step forward and back - Complete Level 4 (Walks with assist in room) - Balance while marching in place and cannot step forward and back - Complete Level 4 (Walks with  assist in room) - Balance while marching in place and cannot step forward and back - Complete Level 4 (Walks with assist in room) - Balance while marching in place and cannot step forward and back - Complete   Is the above level different from baseline mobility prior to current illness? -- -- Yes - Recommend PT order Yes - Recommend PT order --    Row Name 06/27/22 0750 06/26/22 1930         Does patient have an order for bedrest or is patient medically unstable No - Continue assessment No - Continue assessment      What is the  highest level of mobility based on the progressive mobility assessment? Level 1 (Bedfast) - Unable to balance while sitting on edge of bed  unable to put weight on feet d/t pain Level 1 (Bedfast) - Unable to balance while sitting on edge of bed      Is the above level different from baseline mobility prior to current illness? Yes - Recommend PT order Yes - Recommend PT order               Barriers to discharge:  Disposition Plan:  Home Status is: Inpt  Objective: Blood pressure 123/73, pulse 89, temperature 99.1 F (37.3 C), resp. rate 17, height 6' (1.829 m), weight 107.5 kg, SpO2 98 %.  Examination:  Physical Exam Constitutional:      General: He is not in acute distress.    Appearance: Normal appearance.  HENT:     Head: Normocephalic and atraumatic.     Mouth/Throat:     Mouth: Mucous membranes are moist.  Eyes:     Extraocular Movements: Extraocular movements intact.  Cardiovascular:     Rate and Rhythm: Normal rate and regular rhythm.     Heart sounds: Normal heart sounds.  Pulmonary:     Effort: Pulmonary effort is normal. No respiratory distress.     Breath sounds: Normal breath sounds. No wheezing.  Abdominal:     General: Bowel sounds are normal. There is no distension.     Palpations: Abdomen is soft.     Tenderness: There is no abdominal tenderness.  Musculoskeletal:     Cervical back: Normal range of motion and neck supple.     Comments: Right foot edematous (chronically), minimal TTP. Left great toe chronic wound noted   Skin:    General: Skin is warm and dry.  Neurological:     General: No focal deficit present.     Mental Status: He is alert.  Psychiatric:        Mood and Affect: Mood normal.        Behavior: Behavior normal.      Consultants:  ID Orthopedic surgery  Procedures:    Data Reviewed: Results for orders placed or performed during the hospital encounter of 06/22/22 (from the past 24 hour(s))  Glucose, capillary     Status:  Abnormal   Collection Time: 06/28/22  4:29 PM  Result Value Ref Range   Glucose-Capillary 140 (H) 70 - 99 mg/dL  Glucose, capillary     Status: Abnormal   Collection Time: 06/28/22  8:47 PM  Result Value Ref Range   Glucose-Capillary 160 (H) 70 - 99 mg/dL  Glucose, capillary     Status: None   Collection Time: 06/29/22 12:03 AM  Result Value Ref Range   Glucose-Capillary 76 70 - 99 mg/dL  CBC with Differential/Platelet     Status: Abnormal   Collection  Time: 06/29/22  1:08 AM  Result Value Ref Range   WBC 16.1 (H) 4.0 - 10.5 K/uL   RBC 3.26 (L) 4.22 - 5.81 MIL/uL   Hemoglobin 11.2 (L) 13.0 - 17.0 g/dL   HCT 48.5 (L) 46.2 - 70.3 %   MCV 103.1 (H) 80.0 - 100.0 fL   MCH 34.4 (H) 26.0 - 34.0 pg   MCHC 33.3 30.0 - 36.0 g/dL   RDW 50.0 93.8 - 18.2 %   Platelets 244 150 - 400 K/uL   nRBC 0.0 0.0 - 0.2 %   Neutrophils Relative % 67 %   Neutro Abs 11.0 (H) 1.7 - 7.7 K/uL   Lymphocytes Relative 17 %   Lymphs Abs 2.7 0.7 - 4.0 K/uL   Monocytes Relative 10 %   Monocytes Absolute 1.6 (H) 0.1 - 1.0 K/uL   Eosinophils Relative 1 %   Eosinophils Absolute 0.1 0.0 - 0.5 K/uL   Basophils Relative 1 %   Basophils Absolute 0.1 0.0 - 0.1 K/uL   Immature Granulocytes 4 %   Abs Immature Granulocytes 0.66 (H) 0.00 - 0.07 K/uL  Comprehensive metabolic panel     Status: Abnormal   Collection Time: 06/29/22  1:08 AM  Result Value Ref Range   Sodium 136 135 - 145 mmol/L   Potassium 3.9 3.5 - 5.1 mmol/L   Chloride 112 (H) 98 - 111 mmol/L   CO2 17 (L) 22 - 32 mmol/L   Glucose, Bld 96 70 - 99 mg/dL   BUN 24 (H) 6 - 20 mg/dL   Creatinine, Ser 9.93 0.61 - 1.24 mg/dL   Calcium 8.5 (L) 8.9 - 10.3 mg/dL   Total Protein 7.8 6.5 - 8.1 g/dL   Albumin 1.9 (L) 3.5 - 5.0 g/dL   AST 25 15 - 41 U/L   ALT 24 0 - 44 U/L   Alkaline Phosphatase 131 (H) 38 - 126 U/L   Total Bilirubin 0.5 0.3 - 1.2 mg/dL   GFR, Estimated >71 >69 mL/min   Anion gap 7 5 - 15  Magnesium     Status: Abnormal   Collection Time:  06/29/22  1:08 AM  Result Value Ref Range   Magnesium 1.6 (L) 1.7 - 2.4 mg/dL  Glucose, capillary     Status: None   Collection Time: 06/29/22  3:55 AM  Result Value Ref Range   Glucose-Capillary 93 70 - 99 mg/dL  Glucose, capillary     Status: None   Collection Time: 06/29/22  8:18 AM  Result Value Ref Range   Glucose-Capillary 95 70 - 99 mg/dL  Glucose, capillary     Status: Abnormal   Collection Time: 06/29/22 11:44 AM  Result Value Ref Range   Glucose-Capillary 125 (H) 70 - 99 mg/dL    I have Reviewed nursing notes, Vitals, and Lab results since pt's last encounter. Pertinent lab results : see above I have ordered test including BMP, CBC, Mg I have reviewed the last note from staff over past 24 hours I have discussed pt's care plan and test results with nursing staff, case manager   LOS: 7 days   Lewie Chamber, MD [facility] 06/29/2022, 3:25 PM

## 2022-06-29 NOTE — Progress Notes (Signed)
Patient ID: Jesse [name], male   DOB: [birth year], 61 y.o.   MRN: [id number] Patient is seen in follow-up for cellulitis right calf.  There is no tenderness to palpation of the right calf, the compartments are soft, there is no fluctuance no crepitation.  No clinical signs of infection in the foot.  Patient has been able to ambulate and has no pain with active range of motion of his toes or ankle.  No clinical signs of necrotizing fasciitis.  The blisters on the calf are resolving.  Continue with Unna compression wrap to the right lower extremity.

## 2022-06-29 NOTE — Progress Notes (Signed)
PHARMACY ANTIBIOTIC CONSULT NOTE   Jesse [name] a 61 y.o. male admitted on 06/22/2022 with a wound infection. Patient found to have MRI findings c/w osteomyelitis of the second metatarsal phalangeal joint. Ortho recommending surgical intervention, but the patient does not consent at this time. He wishes to speak with Dr. Lajoyce Corners. Pharmacy has been consulted for daptomycin and cefepime dosing.  Scr 1.6, wbc ~16k. Blood culture from 8/10 showed GVR 1/3 bottles. ID still pending from [facility].   Plan: CONTINUE Cefepime 2g IV Q8H  Continue Daptomycin dose to 700 mg Q2000 (~ 8mg /kg AdjBW given BMI >30)  Continue to monitor renal function, surgical plans, ID recs, and DOT  CK Qtues   Allergies:  No Known Allergies  Filed Weights   06/23/22 0500  Weight: 107.5 kg (236 lb 15.9 oz)       Latest Ref Rng & Units 06/29/2022    1:08 AM 06/28/2022    2:29 AM 06/27/2022    1:38 AM  CBC  WBC 4.0 - 10.5 K/uL 16.1  15.8  15.9   Hemoglobin 13.0 - 17.0 g/dL 06/29/2022  62.1  30.8   Hematocrit 39.0 - 52.0 % 33.6  34.0  34.0   Platelets 150 - 400 K/uL 244  235  271     Antibiotics Given (last 72 hours)     Date/Time Action Medication Dose Rate   06/26/22 1930 New Bag/Given  [loss of IV access]   ceFEPIme (MAXIPIME) 2 g in sodium chloride 0.9 % 100 mL IVPB 2 g 200 mL/hr   06/26/22 2053 New Bag/Given   DAPTOmycin (CUBICIN) 700 mg in sodium chloride 0.9 % IVPB 700 mg 128 mL/hr   06/26/22 2301 New Bag/Given   metroNIDAZOLE (FLAGYL) IVPB 500 mg 500 mg 100 mL/hr   06/27/22 0259 New Bag/Given   ceFEPIme (MAXIPIME) 2 g in sodium chloride 0.9 % 100 mL IVPB 2 g 200 mL/hr   06/27/22 0917 New Bag/Given   ceFEPIme (MAXIPIME) 2 g in sodium chloride 0.9 % 100 mL IVPB 2 g 200 mL/hr   06/27/22 1048 New Bag/Given   metroNIDAZOLE (FLAGYL) IVPB 500 mg 500 mg 100 mL/hr   06/27/22 1733 New Bag/Given   ceFEPIme (MAXIPIME) 2 g in sodium chloride 0.9 % 100 mL IVPB 2 g 200 mL/hr   06/27/22 2027 New Bag/Given   DAPTOmycin  (CUBICIN) 700 mg in sodium chloride 0.9 % IVPB 700 mg 128 mL/hr   06/27/22 2236 New Bag/Given   metroNIDAZOLE (FLAGYL) IVPB 500 mg 500 mg 100 mL/hr   06/28/22 0313 New Bag/Given   ceFEPIme (MAXIPIME) 2 g in sodium chloride 0.9 % 100 mL IVPB 2 g 200 mL/hr   06/28/22 06/30/22 New Bag/Given   ceFEPIme (MAXIPIME) 2 g in sodium chloride 0.9 % 100 mL IVPB 2 g 200 mL/hr   06/28/22 1037 New Bag/Given   metroNIDAZOLE (FLAGYL) IVPB 500 mg 500 mg 100 mL/hr   06/28/22 1749 New Bag/Given   ceFEPIme (MAXIPIME) 2 g in sodium chloride 0.9 % 100 mL IVPB 2 g 200 mL/hr   06/28/22 2129 New Bag/Given  [med NA]   DAPTOmycin (CUBICIN) 700 mg in sodium chloride 0.9 % IVPB 700 mg 128 mL/hr   06/28/22 2242 New Bag/Given   metroNIDAZOLE (FLAGYL) IVPB 500 mg 500 mg 100 mL/hr   06/29/22 0228 New Bag/Given   ceFEPIme (MAXIPIME) 2 g in sodium chloride 0.9 % 100 mL IVPB 2 g 200 mL/hr       Antimicrobials this admission: Daptomycin 8/13 >>  Cefepime 8/13 >> Vanc 8/13 x1 Flagyl 8/12 >> (8/19)  Microbiology results: 8/10 blood>> GVR 1/3 bottles 8/12 Bcx: negative 8/12 Ucx: insignificant growth  Jesse [name], PharmD, BCIDP, AAHIVP, CPP Infectious Disease Pharmacist 06/29/2022 10:55 AM

## 2022-06-30 LAB — CBC WITH DIFFERENTIAL/PLATELET
Abs Immature Granulocytes: 0.26 10*3/uL — ABNORMAL HIGH (ref 0.00–0.07)
Basophils Absolute: 0.1 10*3/uL (ref 0.0–0.1)
Basophils Relative: 1 %
Eosinophils Absolute: 0.1 10*3/uL (ref 0.0–0.5)
Eosinophils Relative: 1 %
HCT: 34.7 % — ABNORMAL LOW (ref 39.0–52.0)
Hemoglobin: 11.4 g/dL — ABNORMAL LOW (ref 13.0–17.0)
Immature Granulocytes: 2 %
Lymphocytes Relative: 16 %
Lymphs Abs: 2.5 10*3/uL (ref 0.7–4.0)
MCH: 33.9 pg (ref 26.0–34.0)
MCHC: 32.9 g/dL (ref 30.0–36.0)
MCV: 103.3 fL — ABNORMAL HIGH (ref 80.0–100.0)
Monocytes Absolute: 1.3 10*3/uL — ABNORMAL HIGH (ref 0.1–1.0)
Monocytes Relative: 9 %
Neutro Abs: 11.4 10*3/uL — ABNORMAL HIGH (ref 1.7–7.7)
Neutrophils Relative %: 71 %
Platelets: 230 10*3/uL (ref 150–400)
RBC: 3.36 MIL/uL — ABNORMAL LOW (ref 4.22–5.81)
RDW: 14.3 % (ref 11.5–15.5)
WBC: 15.8 10*3/uL — ABNORMAL HIGH (ref 4.0–10.5)
nRBC: 0 % (ref 0.0–0.2)

## 2022-06-30 LAB — COMPREHENSIVE METABOLIC PANEL
ALT: 21 U/L (ref 0–44)
AST: 23 U/L (ref 15–41)
Albumin: 1.9 g/dL — ABNORMAL LOW (ref 3.5–5.0)
Alkaline Phosphatase: 122 U/L (ref 38–126)
Anion gap: 9 (ref 5–15)
BUN: 21 mg/dL — ABNORMAL HIGH (ref 6–20)
CO2: 16 mmol/L — ABNORMAL LOW (ref 22–32)
Calcium: 8.8 mg/dL — ABNORMAL LOW (ref 8.9–10.3)
Chloride: 111 mmol/L (ref 98–111)
Creatinine, Ser: 1.2 mg/dL (ref 0.61–1.24)
GFR, Estimated: >60 mL/min (ref >60)
Glucose, Bld: 110 mg/dL — ABNORMAL HIGH (ref 70–99)
Potassium: 4.4 mmol/L (ref 3.5–5.1)
Sodium: 136 mmol/L (ref 135–145)
Total Bilirubin: 0.5 mg/dL (ref 0.3–1.2)
Total Protein: 8.1 g/dL (ref 6.5–8.1)

## 2022-06-30 LAB — GLUCOSE, CAPILLARY
Glucose-Capillary: 103 mg/dL — ABNORMAL HIGH (ref 70–99)
Glucose-Capillary: 138 mg/dL — ABNORMAL HIGH (ref 70–99)
Glucose-Capillary: 139 mg/dL — ABNORMAL HIGH (ref 70–99)
Glucose-Capillary: 146 mg/dL — ABNORMAL HIGH (ref 70–99)

## 2022-06-30 LAB — MAGNESIUM: Magnesium: 1.8 mg/dL (ref 1.7–2.4)

## 2022-06-30 NOTE — Progress Notes (Signed)
Progress Note    JOURDIN CONNORS   UYQ:[id number]  DOB: 1961-11-04  DOA: 06/22/2022     8 PCP: Pcp, No  Initial CC: AMS  Hospital Course: Mr. Strother is a 61 yo male with PMH DMII, noncompliance, ethanol use, hypertension and dyslipidemia who presented to the ED with altered mental status. He was found to have severe sepsis with diabetic foot ulcers, osteomyelitis, cellulitis and myositis as well as AKI.  Seen in the ED few days prior for same, left AMA then. ID and orthopedics consulted on admission.   Interval History:  No events overnight.  Still awaiting unna boot wrap. He was able to have kerlix and ace wrap today while waiting.  He continues to show poor understanding of his wound care and the management needed.   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 Lajoyce Corners on 8/17. More concern this is related to Charcot arthropathy rather than OM (noted with chronic OM of left great toe as well) - barriers to discharge are inadequate care for compression after discharge and unable to seek HH due to lack of insurance  - Continue daptomycin and cefepime; per ID can probably de-escalate to PO when nearing discharge - discussed with CM given complex dispo; plan TBD  - awaiting LLE unna boot and then we'll have to see if he even allows it on  GVR in blood culture - 1/4 bottles from 8/10 noted to have GVR; has been sent off for culture ID to [facility] - ID following as well - suspect it'll be a contaminate but will await results  AKI on CKD3a - resolved  -Admitted with creatinine of 4.7, baseline was 1.6, around 1 year ago -Secondary to sepsis, likely ATN, resolving -Renal ultrasound without hydronephrosis, dysplastic left kidney  Type 2  diabetes mellitus -A1c is 8.3 - continue insulin regimen   Acute metabolic encephalopathy - resolved  -Secondary to severe sepsis and AKI, improving   Cholestasis of sepsis - resolved  -Secondary to sepsis, will trend    Alcohol use -No withdrawal noted, add thiamine   Hypertension -BP now stable, antihypertensives on hold   Old records reviewed in assessment of this patient  Antimicrobials: Cefepime 8/13 >> current Dapto 8/13 >> current   DVT prophylaxis:  heparin injection 5,000 Units Start: 06/23/22 1430 SCDs Start: 06/22/22 2306   Code Status:   Code Status: Full Code  Mobility Assessment (last 72 hours)     Mobility Assessment     Row Name 06/29/22 2009 06/28/22 1655 06/28/22 1100 06/28/22 1030 06/27/22 1600   Does patient have an order for bedrest or is patient medically unstable No - Continue assessment -- -- No - Continue assessment Yes- Bedfast (Level 1) - Complete   What is the highest level of mobility based on the progressive mobility assessment? Level 5 (Walks with assist in room/hall) - Balance while stepping forward/back and can walk in room with assist - Complete Level 5 (Walks with assist in room/hall) - Balance while stepping forward/back and can walk in room with assist - Complete Level 4 (Walks with assist in room) - Balance while marching in place and cannot step forward and back - Complete Level 4 (Walks with assist in room) - Balance while marching in place and  cannot step forward and back - Complete Level 4 (Walks with assist in room) - Balance while marching in place and cannot step forward and back - Complete   Is the above level different from baseline mobility prior to current illness? Yes - Recommend PT order -- -- Yes - Recommend PT order Yes - Recommend PT order            Barriers to discharge:  Disposition Plan:  Home Status is: Inpt  Objective: Blood pressure 131/78, pulse 80, temperature 98.1 F (36.7 C), resp. rate 17, height 6'  (1.829 m), weight 107.5 kg, SpO2 100 %.  Examination:  Physical Exam Constitutional:      General: He is not in acute distress.    Appearance: Normal appearance.  HENT:     Head: Normocephalic and atraumatic.     Mouth/Throat:     Mouth: Mucous membranes are moist.  Eyes:     Extraocular Movements: Extraocular movements intact.  Cardiovascular:     Rate and Rhythm: Normal rate and regular rhythm.     Heart sounds: Normal heart sounds.  Pulmonary:     Effort: Pulmonary effort is normal. No respiratory distress.     Breath sounds: Normal breath sounds. No wheezing.  Abdominal:     General: Bowel sounds are normal. There is no distension.     Palpations: Abdomen is soft.     Tenderness: There is no abdominal tenderness.  Musculoskeletal:     Cervical back: Normal range of motion and neck supple.     Comments: Right foot edematous (chronically), minimal TTP. Left great toe chronic wound noted   Skin:    Comments: Superficial skin layers are excoriated in anterior right leg  Neurological:     General: No focal deficit present.     Mental Status: He is alert.  Psychiatric:        Mood and Affect: Mood normal.        Behavior: Behavior normal.      Consultants:  ID Orthopedic surgery  Procedures:    Data Reviewed: Results for orders placed or performed during the hospital encounter of 06/22/22 (from the past 24 hour(s))  Glucose, capillary     Status: Abnormal   Collection Time: 06/29/22  4:04 PM  Result Value Ref Range   Glucose-Capillary 103 (H) 70 - 99 mg/dL  Glucose, capillary     Status: Abnormal   Collection Time: 06/29/22  7:14 PM  Result Value Ref Range   Glucose-Capillary 194 (H) 70 - 99 mg/dL  CBC with Differential/Platelet     Status: Abnormal   Collection Time: 06/30/22  2:34 AM  Result Value Ref Range   WBC 15.8 (H) 4.0 - 10.5 K/uL   RBC 3.36 (L) 4.22 - 5.81 MIL/uL   Hemoglobin 11.4 (L) 13.0 - 17.0 g/dL   HCT 16.0 (L) 73.7 - 10.6 %   MCV 103.3 (H)  80.0 - 100.0 fL   MCH 33.9 26.0 - 34.0 pg   MCHC 32.9 30.0 - 36.0 g/dL   RDW 26.9 48.5 - 46.2 %   Platelets 230 150 - 400 K/uL   nRBC 0.0 0.0 - 0.2 %   Neutrophils Relative % 71 %   Neutro Abs 11.4 (H) 1.7 - 7.7 K/uL   Lymphocytes Relative 16 %   Lymphs Abs 2.5 0.7 - 4.0 K/uL   Monocytes Relative 9 %   Monocytes Absolute 1.3 (H) 0.1 - 1.0 K/uL   Eosinophils Relative 1 %  Eosinophils Absolute 0.1 0.0 - 0.5 K/uL   Basophils Relative 1 %   Basophils Absolute 0.1 0.0 - 0.1 K/uL   Immature Granulocytes 2 %   Abs Immature Granulocytes 0.26 (H) 0.00 - 0.07 K/uL  Comprehensive metabolic panel     Status: Abnormal   Collection Time: 06/30/22  2:34 AM  Result Value Ref Range   Sodium 136 135 - 145 mmol/L   Potassium 4.4 3.5 - 5.1 mmol/L   Chloride 111 98 - 111 mmol/L   CO2 16 (L) 22 - 32 mmol/L   Glucose, Bld 110 (H) 70 - 99 mg/dL   BUN 21 (H) 6 - 20 mg/dL   Creatinine, Ser 0.16 0.61 - 1.24 mg/dL   Calcium 8.8 (L) 8.9 - 10.3 mg/dL   Total Protein 8.1 6.5 - 8.1 g/dL   Albumin 1.9 (L) 3.5 - 5.0 g/dL   AST 23 15 - 41 U/L   ALT 21 0 - 44 U/L   Alkaline Phosphatase 122 38 - 126 U/L   Total Bilirubin 0.5 0.3 - 1.2 mg/dL   GFR, Estimated >01 >09 mL/min   Anion gap 9 5 - 15  Magnesium     Status: None   Collection Time: 06/30/22  2:34 AM  Result Value Ref Range   Magnesium 1.8 1.7 - 2.4 mg/dL  Glucose, capillary     Status: Abnormal   Collection Time: 06/30/22  8:37 AM  Result Value Ref Range   Glucose-Capillary 103 (H) 70 - 99 mg/dL  Glucose, capillary     Status: Abnormal   Collection Time: 06/30/22 11:53 AM  Result Value Ref Range   Glucose-Capillary 138 (H) 70 - 99 mg/dL    I have Reviewed nursing notes, Vitals, and Lab results since pt's last encounter. Pertinent lab results : see above I have ordered test including BMP, CBC, Mg I have reviewed the last note from staff over past 24 hours I have discussed pt's care plan and test results with nursing staff, case manager    LOS: 8 days   Lewie Chamber, MD [facility] 06/30/2022, 2:58 PM

## 2022-06-30 NOTE — Progress Notes (Signed)
Orthopedic Tech Progress Note Patient Details:  [name] 07-11-1961 [id number]  Kerlix and an ace were applied to RLE as a secondary method of compression until we have the supplies for unna boots in stock. Pt agreed to this on the condition that he could sit up on the edge of the bed while his leg was being wrapped. I explained this would be replaced with an unna boot soon to which he seemed understanding and receptive to. Pt had no complaints of any areas of tightness or discomfort caused by the ace or Kerlix. I also encouraged elevating his feet when possible.  Ortho Devices Type of Ortho Device: Ace wrap (Kerlix gauze roll) Ortho Device/Splint Location: RLE Ortho Device/Splint Interventions: Ordered, Application   Post Interventions Patient Tolerated: Well Instructions Provided: Care of device  Netasha Wehrli Carmine Savoy 06/30/2022, 1:12 PM

## 2022-07-01 DIAGNOSIS — N189 Chronic kidney disease, unspecified: Secondary | ICD-10-CM

## 2022-07-01 DIAGNOSIS — L03115 Cellulitis of right lower limb: Secondary | ICD-10-CM

## 2022-07-01 DIAGNOSIS — D72829 Elevated white blood cell count, unspecified: Secondary | ICD-10-CM

## 2022-07-01 LAB — GLUCOSE, CAPILLARY
Glucose-Capillary: 103 mg/dL — ABNORMAL HIGH (ref 70–99)
Glucose-Capillary: 215 mg/dL — ABNORMAL HIGH (ref 70–99)
Glucose-Capillary: 178 mg/dL — ABNORMAL HIGH (ref 70–99)
Glucose-Capillary: 104 mg/dL — ABNORMAL HIGH (ref 70–99)

## 2022-07-01 LAB — COMPREHENSIVE METABOLIC PANEL
ALT: 19 U/L (ref 0–44)
AST: 19 U/L (ref 15–41)
Albumin: 1.9 g/dL — ABNORMAL LOW (ref 3.5–5.0)
Alkaline Phosphatase: 108 U/L (ref 38–126)
Anion gap: 8 (ref 5–15)
BUN: 28 mg/dL — ABNORMAL HIGH (ref 6–20)
CO2: 17 mmol/L — ABNORMAL LOW (ref 22–32)
Calcium: 9 mg/dL (ref 8.9–10.3)
Chloride: 110 mmol/L (ref 98–111)
Creatinine, Ser: 1.28 mg/dL — ABNORMAL HIGH (ref 0.61–1.24)
GFR, Estimated: >60 mL/min (ref >60)
Glucose, Bld: 100 mg/dL — ABNORMAL HIGH (ref 70–99)
Potassium: 4.3 mmol/L (ref 3.5–5.1)
Sodium: 135 mmol/L (ref 135–145)
Total Bilirubin: 0.9 mg/dL (ref 0.3–1.2)
Total Protein: 8 g/dL (ref 6.5–8.1)

## 2022-07-01 LAB — CBC WITH DIFFERENTIAL/PLATELET
Abs Immature Granulocytes: 0.19 10*3/uL — ABNORMAL HIGH (ref 0.00–0.07)
Basophils Absolute: 0.1 10*3/uL (ref 0.0–0.1)
Basophils Relative: 0 %
Eosinophils Absolute: 0.1 10*3/uL (ref 0.0–0.5)
Eosinophils Relative: 1 %
HCT: 34.5 % — ABNORMAL LOW (ref 39.0–52.0)
Hemoglobin: 11.1 g/dL — ABNORMAL LOW (ref 13.0–17.0)
Immature Granulocytes: 1 %
Lymphocytes Relative: 16 %
Lymphs Abs: 2.5 10*3/uL (ref 0.7–4.0)
MCH: 33.5 pg (ref 26.0–34.0)
MCHC: 32.2 g/dL (ref 30.0–36.0)
MCV: 104.2 fL — ABNORMAL HIGH (ref 80.0–100.0)
Monocytes Absolute: 1.2 10*3/uL — ABNORMAL HIGH (ref 0.1–1.0)
Monocytes Relative: 8 %
Neutro Abs: 11.7 10*3/uL — ABNORMAL HIGH (ref 1.7–7.7)
Neutrophils Relative %: 74 %
Platelets: 261 10*3/uL (ref 150–400)
RBC: 3.31 MIL/uL — ABNORMAL LOW (ref 4.22–5.81)
RDW: 14.2 % (ref 11.5–15.5)
WBC: 15.7 10*3/uL — ABNORMAL HIGH (ref 4.0–10.5)
nRBC: 0 % (ref 0.0–0.2)

## 2022-07-01 LAB — MAGNESIUM: Magnesium: 1.7 mg/dL (ref 1.7–2.4)

## 2022-07-01 NOTE — TOC Initial Note (Addendum)
Transition of Care [facility]) - Initial/Assessment Note    Patient Details  Name: Jesse [name] MRN: [id number] Date of Birth: Nov 30, 1960  [facility] CM/SW Contact:    Jesse Kinds, RN Phone Number: [phone] 07/01/2022, 11:43 AM  Clinical Narrative:                  Spoke with patient at the bedside to discuss post acute transition. PTA living in an apartment alone provided to him by a friend. Does not work. Does not have any income. Confirmed address and phone number in Epic as correct. Stated that his sister, Jesse [name], has been assisting with social services and getting his Medicaid application complete. Patient consented with TOC communicating with Jesse [name] concerning discharge plans. Left voicemail for [facility] requesting return call.   Patient stated that he does not have DME at home, but used to have 2 RW, but not any more stating they had become rusted. Dicussed DME needs with PT - will need RW and WC for DC home.   Patient stated that he can arrange for someone to stay with him while he is recovering. Advised that he will need assistance to get to medical appointments including wound care. Discussed hospital follow up and primary care needs at [facility]. Patient agreed to [facility] making him an appointment. Discussed importance of following up for appointment or calling to reschedule/cancel before appointment date. Wound care Center contact information place on AVS.   Patient will need financial assistance for discharge medications. Will follow for MATCH needs. Patient cannot afford copay.   Discussed importance of follow through for Medicaid - advised that medicaid will cover healthcare costs including prescriptons and transportation. Patient thinks he can arrange for transportation home.   TOC following for transition needs.   UPDATE: Received call back from patient's sister, Jesse [name], She will be assisting patient after discharge unless he is incontinent. Noted  urine output recorded on flowsheet. Jesse [name] stated that she will be providing transportation home at discharge and to medical appointments,  but will need to work around her job. Advised that MD stated he may be ready to discharge on Wednesday.   UPDATE: Patient has used MATCH in 04/2021, so he is now eligible to use at this time. MATCH reinstated.   UPDATE: Spoke with Jesse [name] to discuss DME needs. Patient will need RW and WC. Discussed potential for used items, not available. Advised of charity care that covers 1 months, but WC requires a card on file. Jesse [name] to check in with her brother and will f/u with [facility] tomorrow - contact information provided for that RNCM.   Discussed referral for potential for [facility] services RN, PT, SW - barrier for [facility] RN wound care. Jesse [name] understands that she will likely have to take patient to the [facility].   Expected Discharge Plan: Home w Home Health Services Barriers to Discharge: Continued Medical Work up   Patient Goals and CMS Choice Patient states their goals for this hospitalization and ongoing recovery are:: return home to his apartment CMS Medicare.gov Compare Post Acute Care list provided to:: Patient Choice offered to / list presented to : Patient  Expected Discharge Plan and Services Expected Discharge Plan: Home w Home Health Services In-house Referral: PCP / [facility], Clinical Social Work Discharge Planning Services: CM Consult Post Acute Care Choice: Durable Medical Equipment Living arrangements for the past 2 months: Apartment  Prior Living Arrangements/Services Living arrangements for the past 2 months: Apartment Lives with:: Self Patient language and need for interpreter reviewed:: Yes        Need for Family Participation in Patient Care: Yes (Comment) Care giver support system in place?: (P) No (comment)   Criminal Activity/Legal Involvement Pertinent to Current  Situation/Hospitalization: No - Comment as needed  Activities of Daily Living Home Assistive Devices/Equipment: None ADL Screening (condition at time of admission) Patient's cognitive ability adequate to safely complete daily activities?: No Is the patient deaf or have difficulty hearing?: No Does the patient have difficulty seeing, even when wearing glasses/contacts?: No Does the patient have difficulty concentrating, remembering, or making decisions?: Yes Patient able to express need for assistance with ADLs?: No Does the patient have difficulty dressing or bathing?: Yes Independently performs ADLs?: No Does the patient have difficulty walking or climbing stairs?: Yes Weakness of Legs: Both Weakness of Arms/Hands: Both  Permission Sought/Granted Permission sought to share information with : Family Supports Permission granted to share information with : Yes, Verbal Permission Granted  Share Information with NAME: Jesse [name]     Permission granted to share info w Relationship: sister  Permission granted to share info w Contact Information: [phone]  Emotional Assessment Appearance:: Appears stated age Attitude/Demeanor/Rapport: Engaged Affect (typically observed): Accepting Orientation: : Oriented to Self, Oriented to Place, Oriented to  Time, Oriented to Situation Alcohol / Substance Use: Not Applicable Psych Involvement: No (comment)  Admission diagnosis:  Diabetic foot infection (HCC) [Z16.967, L08.9] Subacute osteomyelitis of left foot (HCC) [E93.810] Sepsis with acute renal failure without septic shock, due to unspecified organism, unspecified acute renal failure type (HCC) [A41.9, R65.20, N17.9] Patient Active Problem List   Diagnosis Date Noted   Charcot's arthropathy 06/28/2022   Leukocytosis    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)    Severe protein-calorie malnutrition (HCC)    Diabetic foot  infection (HCC) 06/22/2022   Sepsis (HCC) 06/22/2022   Cellulitis in diabetic foot (HCC) 04/17/2021   Osteomyelitis (HCC) 04/14/2021   HTN (hypertension) 04/08/2021   HLD (hyperlipidemia) 04/08/2021   Alcohol abuse    DM (diabetes mellitus) (HCC) 01/27/2014   AKI (acute kidney injury) (HCC) 01/27/2014   PCP:  Pcp, No Pharmacy:   [facility] [id number] - [place], [place] - [address] AT [facility] [address] [facility] [address] [place] [address] Phone: [phone] Fax: [phone]  [name] [address] [place] [address] Phone: [phone] Fax: [phone]     Social Determinants of Health (SDOH) Interventions    Readmission Risk Interventions     No data to display

## 2022-07-01 NOTE — Progress Notes (Signed)
Progress Note    Jesse [name]   BWI:[id number]  DOB: June 17, 1961  DOA: 06/22/2022     9 PCP: Pcp, No  Initial CC: AMS  Hospital Course: Mr. Jesse [name] is a 61 yo male with PMH DMII, noncompliance, ethanol use, hypertension and dyslipidemia who presented to the ED with altered mental status. He was found to have severe sepsis with diabetic foot ulcers, osteomyelitis, cellulitis and myositis as well as AKI.  Seen in the ED few days prior for same, left AMA then. ID and orthopedics consulted on admission.   Interval History:  No events overnight.  Still awaiting unna boot wrap. He was able to have kerlix and ace wrap on Sunday but had removed part of it before this morning and wrap appeared loose too. CM assisting with dispo. Trying to plan on d/c by Wednesday if able to arrange outpt wound care and DMEs.    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 Lajoyce Corners on 8/17. More concern this is related to Charcot arthropathy rather than OM (noted with chronic OM of left great toe as well) - barriers to discharge are inadequate care for compression after discharge and unable to seek [facility] due to lack of insurance  - Continue daptomycin and cefepime; per ID can probably de-escalate to PO when nearing discharge (hoping for d/c by Wednesday if blood culture result back by then) - discussed with CM given complex dispo; plan TBD  - awaiting LLE unna boot and then we'll have to see if he even allows it on  GVR in blood culture - 1/4 bottles from 8/10 noted to have GVR; has been sent off for culture ID to [facility] - ID following as well - suspect it'll be a contaminate but will await results  AKI on CKD3a - resolved  -Admitted with creatinine of 4.7,  baseline was 1.6, around 1 year ago -Secondary to sepsis, likely ATN, resolving -Renal ultrasound without hydronephrosis, dysplastic left kidney  Type 2 diabetes mellitus -A1c is 8.3 - continue insulin regimen   Acute metabolic encephalopathy - resolved  -Secondary to severe sepsis and AKI, improving   Cholestasis of sepsis - resolved  -Secondary to sepsis, will trend    Alcohol use -No withdrawal noted, add thiamine   Hypertension -BP now stable, antihypertensives on hold   Old records reviewed in assessment of this patient  Antimicrobials: Cefepime 8/13 >> current Dapto 8/13 >> current   DVT prophylaxis:  heparin injection 5,000 Units Start: 06/23/22 1430 SCDs Start: 06/22/22 2306   Code Status:   Code Status: Full Code  Mobility Assessment (last 72 hours)     Mobility Assessment     Row Name 07/01/22 0854 06/30/22 2046 06/29/22 2009 06/28/22 1655     Does patient have an order for bedrest or is patient medically unstable -- No - Continue assessment No - Continue assessment --    What is the highest level of mobility based on the progressive mobility assessment? Level 4 (Walks with assist in room) - Balance while marching in place and cannot step forward and back - Complete Level 5 (Walks with assist in room/hall) - Balance while stepping forward/back and can walk in room with assist - Complete Level 5 (Walks with assist in room/hall) - Balance  while stepping forward/back and can walk in room with assist - Complete Level 5 (Walks with assist in room/hall) - Balance while stepping forward/back and can walk in room with assist - Complete    Is the above level different from baseline mobility prior to current illness? -- Yes - Recommend PT order Yes - Recommend PT order --             Barriers to discharge:  Disposition Plan:  Home Status is: Inpt  Objective: Blood pressure 133/80, pulse 81, temperature 98.4 F (36.9 C), temperature source Oral, resp. rate 18,  height 6' (1.829 m), weight 107.5 kg, SpO2 98 %.  Examination:  Physical Exam Constitutional:      General: He is not in acute distress.    Appearance: Normal appearance.  HENT:     Head: Normocephalic and atraumatic.     Mouth/Throat:     Mouth: Mucous membranes are moist.  Eyes:     Extraocular Movements: Extraocular movements intact.  Cardiovascular:     Rate and Rhythm: Normal rate and regular rhythm.     Heart sounds: Normal heart sounds.  Pulmonary:     Effort: Pulmonary effort is normal. No respiratory distress.     Breath sounds: Normal breath sounds. No wheezing.  Abdominal:     General: Bowel sounds are normal. There is no distension.     Palpations: Abdomen is soft.     Tenderness: There is no abdominal tenderness.  Musculoskeletal:     Cervical back: Normal range of motion and neck supple.     Comments: Right foot edematous (chronically), minimal TTP. Left great toe chronic wound noted   Skin:    Comments: Superficial skin layers are excoriated in anterior right leg  Neurological:     General: No focal deficit present.     Mental Status: He is alert.  Psychiatric:        Mood and Affect: Mood normal.        Behavior: Behavior normal.      Consultants:  ID Orthopedic surgery  Procedures:    Data Reviewed: Results for orders placed or performed during the hospital encounter of 06/22/22 (from the past 24 hour(s))  Glucose, capillary     Status: Abnormal   Collection Time: 06/30/22  4:46 PM  Result Value Ref Range   Glucose-Capillary 139 (H) 70 - 99 mg/dL  Glucose, capillary     Status: Abnormal   Collection Time: 06/30/22  8:44 PM  Result Value Ref Range   Glucose-Capillary 146 (H) 70 - 99 mg/dL  CBC with Differential/Platelet     Status: Abnormal   Collection Time: 07/01/22  2:09 AM  Result Value Ref Range   WBC 15.7 (H) 4.0 - 10.5 K/uL   RBC 3.31 (L) 4.22 - 5.81 MIL/uL   Hemoglobin 11.1 (L) 13.0 - 17.0 g/dL   HCT 58.0 (L) 99.8 - 33.8 %   MCV  104.2 (H) 80.0 - 100.0 fL   MCH 33.5 26.0 - 34.0 pg   MCHC 32.2 30.0 - 36.0 g/dL   RDW 25.0 53.9 - 76.7 %   Platelets 261 150 - 400 K/uL   nRBC 0.0 0.0 - 0.2 %   Neutrophils Relative % 74 %   Neutro Abs 11.7 (H) 1.7 - 7.7 K/uL   Lymphocytes Relative 16 %   Lymphs Abs 2.5 0.7 - 4.0 K/uL   Monocytes Relative 8 %   Monocytes Absolute 1.2 (H) 0.1 - 1.0 K/uL   Eosinophils  Relative 1 %   Eosinophils Absolute 0.1 0.0 - 0.5 K/uL   Basophils Relative 0 %   Basophils Absolute 0.1 0.0 - 0.1 K/uL   Immature Granulocytes 1 %   Abs Immature Granulocytes 0.19 (H) 0.00 - 0.07 K/uL  Comprehensive metabolic panel     Status: Abnormal   Collection Time: 07/01/22  2:09 AM  Result Value Ref Range   Sodium 135 135 - 145 mmol/L   Potassium 4.3 3.5 - 5.1 mmol/L   Chloride 110 98 - 111 mmol/L   CO2 17 (L) 22 - 32 mmol/L   Glucose, Bld 100 (H) 70 - 99 mg/dL   BUN 28 (H) 6 - 20 mg/dL   Creatinine, Ser 8.29 (H) 0.61 - 1.24 mg/dL   Calcium 9.0 8.9 - 93.7 mg/dL   Total Protein 8.0 6.5 - 8.1 g/dL   Albumin 1.9 (L) 3.5 - 5.0 g/dL   AST 19 15 - 41 U/L   ALT 19 0 - 44 U/L   Alkaline Phosphatase 108 38 - 126 U/L   Total Bilirubin 0.9 0.3 - 1.2 mg/dL   GFR, Estimated >16 >96 mL/min   Anion gap 8 5 - 15  Magnesium     Status: None   Collection Time: 07/01/22  2:09 AM  Result Value Ref Range   Magnesium 1.7 1.7 - 2.4 mg/dL  Glucose, capillary     Status: Abnormal   Collection Time: 07/01/22  7:29 AM  Result Value Ref Range   Glucose-Capillary 103 (H) 70 - 99 mg/dL  Glucose, capillary     Status: Abnormal   Collection Time: 07/01/22 11:25 AM  Result Value Ref Range   Glucose-Capillary 215 (H) 70 - 99 mg/dL    I have Reviewed nursing notes, Vitals, and Lab results since pt's last encounter. Pertinent lab results : see above I have ordered test including BMP, CBC, Mg I have reviewed the last note from staff over past 24 hours I have discussed pt's care plan and test results with nursing staff, case  manager   LOS: 9 days   Lewie Chamber, MD [facility] 07/01/2022, 2:32 PM

## 2022-07-01 NOTE — Progress Notes (Signed)
[facility] Infectious Diseases Follow Up Note  Patient Identification: Patient Name: Jesse [name] MRN: [id number] Admit Date: 06/22/2022  7:41 PM Age: 61 y.o.Today's Date: 07/01/2022  Reason for Visit: DFU/cellulitis  Principal Problem:   Sepsis (HCC) Active Problems:   DM (diabetes mellitus) (HCC)   AKI (acute kidney injury) (HCC)   HTN (hypertension)   HLD (hyperlipidemia)   Osteomyelitis (HCC)   Cellulitis in diabetic foot (HCC)   Diabetic foot infection (HCC)   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)   Severe protein-calorie malnutrition (HCC)   Charcot's arthropathy   Leukocytosis  Antibiotics:  Vancomycin 8/10-c Cefepime 8/10-c Metronidazole 8/12-c   Lines/Hardware: none    Interval Events: continues to be afebrile, WBC down trending to 15.   Assessment # RT leg cellulitis/ulcer with venous insufficiency - Overall seems to be improving with no signs of worsening infection - Ortho following  - Primary trying to plan on d/c by Wednesday if able to arrange outpt wound care and DMEs.    # Leukocytosis - stable, 2/2 above   # 8/10 gram variable rod in 1/2 sets  # Uncontrolled DM 2 - a1c  8  # Protein Energy malnutrition # AKI on CKD # Alcohol use    Recommendations Continue daptomycin, cefepime and metronidazole for rt leg cellulitis with venous ulcer while inpatient.   Would recommend to transition to PO doxycycline and PO ciprofloxacin when ready to go home to complete 2 weeks course ( IV and PO)  Monitor CBC , BMP and CPK  Unclear when GVR from blood cx will be identified, could be contaminant  vs not. Will follow with [facility] peripherally  ID available as needed. Please recall if needed   Rest of the management as per the primary team. Thank you for the consult. Please page with pertinent questions or  concerns.  ______________________________________________________________________ Subjective patient seen and examined at the bedside.  He is having bowel movement sitting in a chair Mild pain at the left leg  No complaints otherwise   Vitals BP 133/80 (BP Location: Left Arm)   Pulse 81   Temp 98.4 F (36.9 C) (Oral)   Resp 18   Ht 6' (1.829 m)   Wt 107.5 kg   SpO2 98%   BMI 32.14 kg/m     Physical Exam Constitutional: sitting up in the chair and having a BM    Comments:   Cardiovascular:     Rate and Rhythm: Normal rate and regular rhythm.     Heart sounds:   Pulmonary:     Effort: Pulmonary effort is normal.     Comments:   Abdominal:     Palpations: Abdomen is soft.     Tenderness: non distended   Musculoskeletal:        General:  RT leg/Foot is bandaged in Kerlix and ACE wrap. Calves soft, able to wriggle rt toes.   Left leg soft calves with wrinkled skin and no open sores  Left great toe chronic wound with no signs of acute infection   Neurological:     General: grossly non focal, awake, alert and oriented   Psychiatric:        Mood and Affect: Mood normal.   Pertinent Microbiology Results for orders placed or performed during the hospital encounter of 06/22/22  Blood Culture (routine x 2)     Status: None   Collection Time: 06/22/22  8:00 PM   Specimen: BLOOD  Result  Value Ref Range Status   Specimen Description BLOOD LEFT ANTECUBITAL  Final   Special Requests   Final    BOTTLES DRAWN AEROBIC AND ANAEROBIC Blood Culture results may not be optimal due to an excessive volume of blood received in culture bottles   Culture   Final    NO GROWTH 5 DAYS Performed at [facility], [address] [place], [place] [address]    Report Status 06/27/2022 FINAL  Final  Blood Culture (routine x 2)     Status: None   Collection Time: 06/22/22  8:10 PM   Specimen: BLOOD  Result Value Ref Range Status   Specimen Description BLOOD RIGHT ANTECUBITAL   Final   Special Requests   Final    BOTTLES DRAWN AEROBIC AND ANAEROBIC Blood Culture adequate volume   Culture   Final    NO GROWTH 5 DAYS Performed at [facility], [address], [place], [place] [address]    Report Status 06/27/2022 FINAL  Final  Urine Culture     Status: Abnormal   Collection Time: 06/22/22  9:21 PM   Specimen: In/Out Cath Urine  Result Value Ref Range Status   Specimen Description IN/OUT CATH URINE  Final   Special Requests NONE  Final   Culture (A)  Final    <10,000 COLONIES/mL INSIGNIFICANT GROWTH Performed at [facility], [address], [place], [place] [address]    Report Status 06/24/2022 FINAL  Final    Pertinent Lab.    Latest Ref Rng & Units 07/01/2022    2:09 AM 06/30/2022    2:34 AM 06/29/2022    1:08 AM  CBC  WBC 4.0 - 10.5 K/uL 15.7  15.8  16.1   Hemoglobin 13.0 - 17.0 g/dL 25.6  38.9  37.3   Hematocrit 39.0 - 52.0 % 34.5  34.7  33.6   Platelets 150 - 400 K/uL 261  230  244       Latest Ref Rng & Units 07/01/2022    2:09 AM 06/30/2022    2:34 AM 06/29/2022    1:08 AM  CMP  Glucose 70 - 99 mg/dL 428  768  96   BUN 6 - 20 mg/dL 28  21  24    Creatinine 0.61 - 1.24 mg/dL  1.15  7.26   Sodium 135 - 145 mmol/L 135  136  136   Potassium 3.5 - 5.1 mmol/L 4.3  4.4  3.9   Chloride 98 - 111 mmol/L 110  111  112   CO2 22 - 32 mmol/L 17  16  17    Calcium 8.9 - 10.3 mg/dL 9.0  8.8  8.5   Total Protein 6.5 - 8.1 g/dL 8.0  8.1  7.8   Total Bilirubin 0.3 - 1.2 mg/dL 0.9  0.5  0.5   Alkaline Phos 38 - 126 U/L 108  122  131   AST 15 - 41 U/L 19  23  25    ALT 0 - 44 U/L 19  21  24       Pertinent Imaging today Plain films and CT images have been personally visualized and interpreted; radiology reports have been reviewed. Decision making incorporated into the Impression / Recommendations.  No results found.   I spent 51 minutes for this patient encounter including review of prior medical records, coordination of care with  primary/other specialist with greater than 50% of time being face to face/counseling and discussing diagnostics/treatment plan with the patient/family.  Electronically signed by:   Odette Fraction, MD Infectious Disease Physician [facility] for Infectious Disease Pager: [phone]

## 2022-07-01 NOTE — Progress Notes (Signed)
Occupational Therapy Treatment Patient Details Name: KALIX MEINECKE MRN: [id number] DOB: 1961/11/11 Today's Date: 07/01/2022   History of present illness 61 y/o male presented to ED on 06/22/22 for AMS and possible sepsis. Thumb size hole in R foot after stepping on nail 6 months ago noted in ED. MRI showed edema at base of second metatarsal consistent more with Charcot changes than osteomyelitis. Admitted for sepsis and R calf cellulitis. PMH: T2DM, HTN, alcohol abuse   OT comments  Pt in recliner upon therapy arrival and agreeable to participate in OT treatment session. OT administered pillbox test which measures executive functioning and estimates medication management abilities. Finished test scored and results can be found below in cognition section. Pt without reading glasses. Pt attempted to use magnifying glass to read label although had max difficulty. OT read each label for patient in order to complete test. Pt required label instructions to be repeated 4-5 times for each bottle. Difficulty opening medication bottles. OT provided VC initially for techniques/instructions and added visual demonstration for understanding. Based on pt's results he is more than likely to experience severe difficulty with medication management and executive functioning abilities which is concerning.   Recommendations for follow up therapy are one component of a multi-disciplinary discharge planning process, led by the attending physician.  Recommendations may be updated based on patient status, additional functional criteria and insurance authorization.    Follow Up Recommendations  Home health OT    Assistance Recommended at Discharge Frequent or constant Supervision/Assistance  Patient can return home with the following  Assistance with cooking/housework;Direct supervision/assist for financial management;Direct supervision/assist for medications management;Assist for transportation   Equipment  Recommendations  Other (comment) (RW)       Precautions / Restrictions Precautions Precautions: Fall Precaution Comments: B LE wounds Restrictions Weight Bearing Restrictions: No          Cognition Arousal/Alertness: Awake/alert Behavior During Therapy: Flat affect Overall Cognitive Status: Impaired/Different from baseline Area of Impairment: Attention, Memory, Following commands, Awareness, Problem solving       General Comments: Pillbox test administered during session. Results:  # of omission errors: 35 # of misplaced movement errors/sum of pills placed in incorrect compartments: 2 # of total errors = sum of total omission errors, commission errors, & misplacement errors: 37 Total time to complete the task: 10'06" Score: Fail  (due to 3 or more total errors of any type on task)                   Pertinent Vitals/ Pain       Pain Assessment Pain Assessment: Faces Faces Pain Scale: No hurt         Frequency  Min 2X/week        Progress Toward Goals  OT Goals(current goals can now be found in the care plan section)  Progress towards OT goals: Progressing toward goals     Plan Discharge plan remains appropriate;Frequency remains appropriate       AM-PAC OT "6 Clicks" Daily Activity     Outcome Measure   Help from another person eating meals?: None Help from another person taking care of personal grooming?: A Little Help from another person toileting, which includes using toliet, bedpan, or urinal?: A Little Help from another person bathing (including washing, rinsing, drying)?: A Little Help from another person to put on and taking off regular upper body clothing?: A Little Help from another person to put on and taking off regular lower body clothing?: A Little  6 Click Score: 19    End of Session    OT Visit Diagnosis: Other abnormalities of gait and mobility (R26.89);Unsteadiness on feet (R26.81)   Activity Tolerance Patient tolerated  treatment well   Patient Left in chair;with call bell/phone within reach;with chair alarm set   Nurse Communication          Time: [phone] OT Time Calculation (min): 28 min  Charges: OT General Charges $OT Visit: 1 Visit OT Treatments $Therapeutic Activity: 23-37 mins  Limmie Patricia, OTR/L,CBIS  Supplemental OT - [facility] and [facility]   Lateasha Breuer, Charisse March 07/01/2022, 4:39 PM

## 2022-07-01 NOTE — Progress Notes (Signed)
Mobility Specialist Progress Note   07/01/22 1745  Mobility  Activity Refused mobility   Pt deferring mobility this afternoon d/t pain level (8/10). Requesting to rest for remainder of the day even after encouragement. Will f/u tomorrow if time permits.  Frederico Hamman Mobility Specialist MS [facility] #:  [phone] Acute Rehab Office:  [phone]

## 2022-07-01 NOTE — Progress Notes (Addendum)
Physical Therapy Treatment Patient Details Name: Jesse [name] MRN: [id number] DOB: September 03, 1961 Today's Date: 07/01/2022   History of Present Illness 61 y/o male presented to ED on 06/22/22 for AMS and possible sepsis. Thumb size hole in R foot after stepping on nail 6 months ago noted in ED. MRI showed edema at base of second metatarsal consistent more with Charcot changes than osteomyelitis. Admitted for sepsis and R calf cellulitis. PMH: T2DM, HTN, alcohol abuse    PT Comments    Pt limited by R LE pain and confusion. Several attempts at gait but limited to short distance from bed to chair. Pt also instructed in and performed therex for R LE strengthening. Will progress as tolerated. Recommend wheelchair upon discharge due to pt's limited gait capacity at this time. Pt appears confused and will need frequent supervision/assistance at home to care for his needs; if adequate assistance not available, recommend SNF.  Recommendations for follow up therapy are one component of a multi-disciplinary discharge planning process, led by the attending physician.  Recommendations may be updated based on patient status, additional functional criteria and insurance authorization.  Follow Up Recommendations  Home health PT     Assistance Recommended at Discharge Frequent or constant Supervision/Assistance  Patient Jesse return home with the following A little help with walking and/or transfers;A little help with bathing/dressing/bathroom;Assistance with cooking/housework;Direct supervision/assist for medications management;Direct supervision/assist for financial management;Assist for transportation   Equipment Recommendations  Rolling walker (2 wheels);Wheelchair (measurements PT);Wheelchair cushion (measurements PT)    Recommendations for Other Services       Precautions / Restrictions Precautions Precautions: Fall Precaution Comments: B LE wounds Restrictions Weight Bearing Restrictions: No      Mobility  Bed Mobility Overal bed mobility: Modified Independent                  Transfers Overall transfer level: Needs assistance Equipment used: Rolling walker (2 wheels) Transfers: Sit to/from Stand, Bed to chair/wheelchair/BSC Sit to Stand: Min guard   Step pivot transfers: Min guard       General transfer comment: Increased cues for hand placements. Pt thinks folding part of RW are brakes (pt educated on RW). Pt performed three sit to stands (sat down after first two due to increased pain with WB). Cues also provided for sequencing and safety awareness.    Ambulation/Gait Ambulation/Gait assistance: Min guard Gait Distance (Feet): 5 Feet Assistive device: Rolling walker (2 wheels) Gait Pattern/deviations: Decreased step length - left, Decreased step length - right, Antalgic, Decreased stance time - right, Step-to pattern Gait velocity: decreased Gait velocity interpretation: <1.31 ft/sec, indicative of household ambulator   General Gait Details: Distance limited within room from bed to chair due to pt's increased R LE pain with WB. Pt required cues to perform modified three point pattern.   Stairs             Wheelchair Mobility    Modified Rankin (Stroke Patients Only)       Balance Overall balance assessment: Needs assistance Sitting-balance support: No upper extremity supported, Feet supported Sitting balance-Leahy Scale: Good     Standing balance support: Bilateral upper extremity supported, Reliant on assistive device for balance Standing balance-Leahy Scale: Fair                              Cognition Arousal/Alertness: Awake/alert Behavior During Therapy: Flat affect Overall Cognitive Status: Impaired/Different from baseline Area of Impairment: Attention, Memory, Safety/judgement,  Awareness, Problem solving                   Current Attention Level: Sustained Memory: Decreased short-term memory    Safety/Judgement: Decreased awareness of deficits, Decreased awareness of safety Awareness: Intellectual Problem Solving: Decreased initiation, Difficulty sequencing, Requires verbal cues, Requires tactile cues, Slow processing General Comments: Pt confused. Pt naked sitting at EOB upon arrival while eating breakfast.        Exercises General Exercises - Lower Extremity Ankle Circles/Pumps: Right, 10 reps, Supine Hip ABduction/ADduction: Right, Supine (8 reps) Straight Leg Raises: Right, Supine (8 reps)    General Comments        Pertinent Vitals/Pain Pain Assessment Pain Assessment: 0-10 Pain Score: 4  (8/10 with WB; reported "20" pain initially at start of session but when asked again 4/10) Pain Location: R LE Pain Descriptors / Indicators: Aching, Grimacing Pain Intervention(s): Limited activity within patient's tolerance, Monitored during session    Home Living                          Prior Function            PT Goals (current goals Jesse now be found in the care plan section) Acute Rehab PT Goals Patient Stated Goal: did not state PT Goal Formulation: With family Time For Goal Achievement: 07/11/22 Potential to Achieve Goals: Good Progress towards PT goals: Progressing toward goals (limited this session 2/2 pain and confusion)    Frequency    Min 3X/week      PT Plan Current plan remains appropriate    Co-evaluation              AM-PAC PT "6 Clicks" Mobility   Outcome Measure  Help needed turning from your back to your side while in a flat bed without using bedrails?: None Help needed moving from lying on your back to sitting on the side of a flat bed without using bedrails?: None Help needed moving to and from a bed to a chair (including a wheelchair)?: A Little Help needed standing up from a chair using your arms (e.g., wheelchair or bedside chair)?: A Little Help needed to walk in hospital room?: A Lot Help needed climbing 3-5  steps with a railing? : A Lot 6 Click Score: 18    End of Session Equipment Utilized During Treatment: Gait belt Activity Tolerance: Patient limited by pain Patient left: in chair;with call bell/phone within reach;with chair alarm set Nurse Communication: Mobility status;Precautions PT Visit Diagnosis: Unsteadiness on feet (R26.81);Muscle weakness (generalized) (M62.81);Difficulty in walking, not elsewhere classified (R26.2)     Time: 1610-9604 PT Time Calculation (min) (ACUTE ONLY): 18 min  Charges:  $Therapeutic Activity: 8-22 mins                     Tana Coast, PT    [employer] 07/01/2022, 11:44 AM

## 2022-07-02 ENCOUNTER — Encounter (HOSPITAL_COMMUNITY): Payer: Self-pay | Admitting: Internal Medicine

## 2022-07-02 LAB — COMPREHENSIVE METABOLIC PANEL
ALT: 17 U/L (ref 0–44)
AST: 18 U/L (ref 15–41)
Albumin: 1.9 g/dL — ABNORMAL LOW (ref 3.5–5.0)
Alkaline Phosphatase: 97 U/L (ref 38–126)
Anion gap: 8 (ref 5–15)
BUN: 29 mg/dL — ABNORMAL HIGH (ref 6–20)
CO2: 16 mmol/L — ABNORMAL LOW (ref 22–32)
Calcium: 8.9 mg/dL (ref 8.9–10.3)
Chloride: 111 mmol/L (ref 98–111)
Creatinine, Ser: 1.26 mg/dL — ABNORMAL HIGH (ref 0.61–1.24)
GFR, Estimated: >60 mL/min (ref >60)
Glucose, Bld: 119 mg/dL — ABNORMAL HIGH (ref 70–99)
Potassium: 4.7 mmol/L (ref 3.5–5.1)
Sodium: 135 mmol/L (ref 135–145)
Total Bilirubin: 0.4 mg/dL (ref 0.3–1.2)
Total Protein: 7.7 g/dL (ref 6.5–8.1)

## 2022-07-02 LAB — CBC WITH DIFFERENTIAL/PLATELET
Abs Immature Granulocytes: 0.13 10*3/uL — ABNORMAL HIGH (ref 0.00–0.07)
Basophils Absolute: 0 10*3/uL (ref 0.0–0.1)
Basophils Relative: 0 %
Eosinophils Absolute: 0.1 10*3/uL (ref 0.0–0.5)
Eosinophils Relative: 1 %
HCT: 31.5 % — ABNORMAL LOW (ref 39.0–52.0)
Hemoglobin: 10.5 g/dL — ABNORMAL LOW (ref 13.0–17.0)
Immature Granulocytes: 1 %
Lymphocytes Relative: 19 %
Lymphs Abs: 2.4 10*3/uL (ref 0.7–4.0)
MCH: 34.8 pg — ABNORMAL HIGH (ref 26.0–34.0)
MCHC: 33.3 g/dL (ref 30.0–36.0)
MCV: 104.3 fL — ABNORMAL HIGH (ref 80.0–100.0)
Monocytes Absolute: 1 10*3/uL (ref 0.1–1.0)
Monocytes Relative: 8 %
Neutro Abs: 9 10*3/uL — ABNORMAL HIGH (ref 1.7–7.7)
Neutrophils Relative %: 71 %
Platelets: 265 10*3/uL (ref 150–400)
RBC: 3.02 MIL/uL — ABNORMAL LOW (ref 4.22–5.81)
RDW: 14.1 % (ref 11.5–15.5)
WBC: 12.7 10*3/uL — ABNORMAL HIGH (ref 4.0–10.5)
nRBC: 0 % (ref 0.0–0.2)

## 2022-07-02 LAB — GLUCOSE, CAPILLARY
Glucose-Capillary: 103 mg/dL — ABNORMAL HIGH (ref 70–99)
Glucose-Capillary: 119 mg/dL — ABNORMAL HIGH (ref 70–99)
Glucose-Capillary: 122 mg/dL — ABNORMAL HIGH (ref 70–99)
Glucose-Capillary: 139 mg/dL — ABNORMAL HIGH (ref 70–99)
Glucose-Capillary: 141 mg/dL — ABNORMAL HIGH (ref 70–99)
Glucose-Capillary: 166 mg/dL — ABNORMAL HIGH (ref 70–99)

## 2022-07-02 LAB — CK: Total CK: 94 U/L (ref 49–397)

## 2022-07-02 LAB — MAGNESIUM: Magnesium: 1.6 mg/dL — ABNORMAL LOW (ref 1.7–2.4)

## 2022-07-02 MED ORDER — THIAMINE HCL 100 MG PO TABS
100.0000 mg | ORAL_TABLET | Freq: Every day | ORAL | Status: DC
  Administered 2022-07-02 – 2022-07-03 (×2): 100 mg via ORAL
  Filled 2022-07-02 (×2): qty 1

## 2022-07-02 MED ORDER — THIAMINE MONONITRATE 100 MG PO TABS
100.0000 mg | ORAL_TABLET | Freq: Every day | ORAL | Status: DC
  Filled 2022-07-02: qty 1

## 2022-07-02 MED ORDER — SODIUM BICARBONATE 650 MG PO TABS
650.0000 mg | ORAL_TABLET | Freq: Three times a day (TID) | ORAL | Status: DC
  Administered 2022-07-02 – 2022-07-03 (×4): 650 mg via ORAL
  Filled 2022-07-02 (×4): qty 1

## 2022-07-02 MED ORDER — MAGNESIUM SULFATE 2 GM/50ML IV SOLN
2.0000 g | Freq: Once | INTRAVENOUS | Status: AC
  Administered 2022-07-02: 2 g via INTRAVENOUS
  Filled 2022-07-02: qty 50

## 2022-07-02 NOTE — TOC Progression Note (Signed)
Transition of Care [facility]) - Progression Note    Patient Details  Name: EMRICK HENSCH MRN: [id number] Date of Birth: 10-19-1961  [facility]) CM/SW Contact  Epifanio Lesches, RN Phone Number: 07/02/2022, 4:04 PM  Clinical Narrative:    Referral made with [facility] for charity home health services, RN,PT, SW...acceptance pending. NCM provided [employer] with [name]'s(sister) #  in order to obtain credit card  to place on file for DME . DME : RW and W/C will be delivered to bedside once approval received. TOC team will continue to monitor for needs....   Expected Discharge Plan: Home w Home Health Services Barriers to Discharge: Continued Medical Work up  Expected Discharge Plan and Services Expected Discharge Plan: Home w Home Health Services In-house Referral: PCP / [facility], Clinical Social Work Discharge Planning Services: CM Consult Post Acute Care Choice: Durable Medical Equipment Living arrangements for the past 2 months: Apartment                           HH Arranged: Charity fundraiser, PT, Social Work [employer] Agency: [facility] Date HH Agency Contacted: 07/02/22 Time HH Agency Contacted: 1602 Representative spoke with at [facility] Agency: Caryl Asp   Social Determinants of Health (SDOH) Interventions    Readmission Risk Interventions     No data to display

## 2022-07-02 NOTE — Progress Notes (Signed)
    Durable Medical Equipment  (From admission, onward)           Start     Ordered   07/01/22 1532  For home use only DME Walker rolling  Once       Question Answer Comment  Walker: With 5 Inch Wheels   Patient needs a walker to treat with the following condition Decreased functional mobility and endurance      07/01/22 1531   07/01/22 1531  For home use only DME lightweight manual wheelchair with seat cushion  Once       Comments: Patient suffers from sepsis, venous ulcers which impairs their ability to perform daily activities like bathing, dressing, and toileting in the home.  A walker will not resolve  issue with performing activities of daily living. A wheelchair will allow patient to safely perform daily activities. Patient is not able to propel themselves in the home using a standard weight wheelchair due to endurance and general weakness. Patient can self propel in the lightweight wheelchair. Length of need 6 months . Accessories: elevating leg rests (ELRs), wheel locks, extensions and anti-tippers.   07/01/22 1531

## 2022-07-02 NOTE — Progress Notes (Signed)
PROGRESS NOTE    Jesse [name]  KZS:[id number] DOB: 09/18/1961 DOA: 06/22/2022  PCP: Pcp, No   Brief Narrative:  This 61 yo male with PMH DMII, noncompliance, ethanol use, hypertension and dyslipidemia who presented to the ED with altered mental status. He was found to have severe sepsis with diabetic foot ulcers, osteomyelitis, cellulitis and myositis as well as AKI.  Seen in the ED few days prior for same, left AMA then came to ED again. ID and orthopedics consulted on admission.   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 Patient was evaluated by Dr. Lajoyce Corners on 8/17. More concern this is related to Charcot arthropathy rather than OM (noted with chronic OM of left great toe as well). Barriers to discharge are inadequate care for infection after discharge and unable to seek [facility] due to lack of insurance  Continue daptomycin ,cefepime and flagyl while inpatient. Can transition to p.o. doxycycline and p.o. ciprofloxacin to complete 2-week course. Discussed with CM given complex dispo; plan TBD  Awaiting LLE unna boot and then we'll have to see if he even allows it on   GVR in blood culture: - 1/4 bottles from 8/10 noted to have GVR; has been sent off for culture ID to [facility].  Could be contaminant. - ID following as well - suspect  it'll be a contaminate but will await results   AKI on CKD3a - resolved. Admitted with creatinine of 4.7, baseline was 1.6, around 1 year ago Secondary to sepsis, likely ATN, resolving Renal ultrasound without hydronephrosis, dysplastic left kidney Renal functions back to baseline.   Type 2 diabetes mellitus HbA1c is 8.3 Continue insulin regimen    Acute metabolic encephalopathy - resolved  Secondary to severe sepsis and AKI, improving   Cholestasis of sepsis - resolved  Secondary to sepsis, will trend    Alcohol use No withdrawal noted, Continue thiamine   Hypertension BP now stable, antihypertensives on hold   Hypomagnesemia: Replaced.  Continue to monitor  DVT prophylaxis: Heparin Code Status: Full code Family Communication: No family at bedside Disposition Plan:   Status is: Inpatient Remains inpatient appropriate because: Admitted for severe sepsis secondary to diabetic foot infection.  Sepsis resolved ID recommended patient can be discharged on p.o. antibiotics if home health services arranged.  Anticipated discharge home 8/23.   Consultants:  Orthopedics Podiatry  Procedures:  Antimicrobials: Daptomycin, cefepime, Flagyl  Subjective: Patient was seen and examined at bedside.  Overnight events noted.  Patient sitting comfortably on the chair,  asking  when he can be discharged.  Objective: Vitals:   06/30/22 2056 07/01/22 0752 07/01/22 1741 07/02/22 1000  BP: 130/77 133/80 136/73 125/79  Pulse:  81 82 88  Resp: 18  17 (!) 24  Temp: 98.4 F (36.9 C) 98.4 F (36.9 C) (!) 97.5 F (36.4 C)   TempSrc: Oral Oral Oral   SpO2: 100% 98% 100%  100%  Weight:      Height:        Intake/Output Summary (Last 24 hours) at 07/02/2022 1432 Last data filed at 07/01/2022 1800 Gross per 24 hour  Intake --  Output 700 ml  Net -700 ml   Filed Weights   06/23/22 0500  Weight: 107.5 kg    Examination:  General exam: Appears comfortable, not in any acute  distress. Respiratory system: CTA bilaterally, no wheezing, no crackles, normal respiratory effort. Cardiovascular system: S1 & S2 heard, regular rate and rhythm, no murmur.  Gastrointestinal system: Abdomen is soft, non tender, non distended, BS + Central nervous system: Alert and oriented x 3. No focal neurological deficits. Extremities: Right foot erythematous, swollen, tenderness noted,  left great toe chronic wound noted,  superficial excoriations in the right anterior leg Skin: No rashes, lesions or ulcers Psychiatry: Judgement and insight appear normal. Mood & affect appropriate.     Data Reviewed: I have personally reviewed following labs and imaging studies  CBC: Recent Labs  Lab 06/28/22 0229 06/29/22 0108 06/30/22 0234 07/01/22 0209 07/02/22 0615  WBC 15.8* 16.1* 15.8* 15.7* 12.7*  NEUTROABS 12.6* 11.0* 11.4* 11.7* 9.0*  HGB 11.2* 11.2* 11.4* 11.1* 10.5*  HCT 34.0* 33.6* 34.7* 34.5* 31.5*  MCV 103.3* 103.1* 103.3* 104.2* 104.3*  PLT 235 244 230 261 265   Basic Metabolic Panel: Recent Labs  Lab 06/28/22 0229 06/29/22 0108 06/30/22 0234 07/01/22 0209 07/02/22 0615  NA 135 136 136 135 135  K 3.6 3.9 4.4 4.3 4.7  CL 112* 112* 111 110 111  CO2 16* 17* 16* 17* 16*  GLUCOSE 110* 96 110* 100* 119*  BUN 30* 24* 21* 28* 29*  CREATININE 1.38* 1.22 1.20 1.28* 1.26*  CALCIUM 8.1* 8.5* 8.8* 9.0 8.9  MG 1.6* 1.6* 1.8 1.7 1.6*   GFR: Estimated Creatinine Clearance: 80 mL/min (A) (by C-G formula based on SCr of 1.26 mg/dL (H)). Liver Function Tests: Recent Labs  Lab 06/28/22 0229 06/29/22 0108 06/30/22 0234 07/01/22 0209 07/02/22 0615  AST 27 25 23 19 18   ALT 30 24 21 19 17   ALKPHOS 137* 131* 122 108 97  BILITOT 0.9 0.5 0.5 0.9 0.4  PROT 7.4 7.8 8.1 8.0 7.7  ALBUMIN 1.9* 1.9* 1.9* 1.9* 1.9*   No results for input(s): "LIPASE", "AMYLASE" in the last 168 hours. No results for input(s): "AMMONIA" in the last 168 hours. Coagulation Profile: No results for  input(s): "INR", "PROTIME" in the last 168 hours. Cardiac Enzymes: Recent Labs  Lab 07/02/22 0615  CKTOTAL 94   BNP (last 3 results) No results for input(s): "PROBNP" in the last 8760 hours. HbA1C: No results for input(s): "HGBA1C" in the last 72 hours. CBG: Recent Labs  Lab 07/01/22 2035 07/02/22 0032 07/02/22 0426 07/02/22 0729 07/02/22 1107  GLUCAP 104* 122* 103* 119* 141*   Lipid Profile: No results for input(s): "CHOL", "HDL", "LDLCALC", "TRIG", "CHOLHDL", "LDLDIRECT" in the last 72 hours. Thyroid Function Tests: No results for input(s): "TSH", "T4TOTAL", "FREET4", "T3FREE", "THYROIDAB" in the last 72 hours. Anemia Panel: No results for input(s): "VITAMINB12", "FOLATE", "FERRITIN", "TIBC", "IRON", "RETICCTPCT" in the last 72 hours. Sepsis Labs: No results for input(s): "PROCALCITON", "LATICACIDVEN" in the last 168 hours.  Recent Results (from the past 240 hour(s))  Blood Culture (routine x 2)     Status: None   Collection Time: 06/22/22  8:00 PM   Specimen: BLOOD  Result Value Ref Range Status   Specimen Description BLOOD LEFT ANTECUBITAL  Final  Special Requests   Final    BOTTLES DRAWN AEROBIC AND ANAEROBIC Blood Culture results may not be optimal due to an excessive volume of blood received in culture bottles   Culture   Final    NO GROWTH 5 DAYS Performed at [facility] Lab, [address] [place], [place] [address]    Report Status 06/27/2022 FINAL  Final  Blood Culture (routine x 2)     Status: None   Collection Time: 06/22/22  8:10 PM   Specimen: BLOOD  Result Value Ref Range Status   Specimen Description BLOOD RIGHT ANTECUBITAL  Final   Special Requests   Final    BOTTLES DRAWN AEROBIC AND ANAEROBIC Blood Culture adequate volume   Culture   Final    NO GROWTH 5 DAYS Performed at [facility], [address], [place], [place] [address]    Report Status 06/27/2022 FINAL  Final  Urine Culture     Status: Abnormal   Collection Time: 06/22/22   9:21 PM   Specimen: In/Out Cath Urine  Result Value Ref Range Status   Specimen Description IN/OUT CATH URINE  Final   Special Requests NONE  Final   Culture (A)  Final    <10,000 COLONIES/mL INSIGNIFICANT GROWTH Performed at [facility], [address] [place], [place] [address]    Report Status 06/24/2022 FINAL  Final    Radiology Studies: No results found.  Scheduled Meds:  Gerhardt's butt cream   Topical TID   heparin injection (subcutaneous)  5,000 Units Subcutaneous Q8H   insulin aspart  0-15 Units Subcutaneous Q4H   insulin detemir  6 Units Subcutaneous Daily   losartan  25 mg Oral Daily   senna-docusate  1 tablet Oral QHS   sodium bicarbonate  650 mg Oral TID   sodium chloride flush  3 mL Intravenous Q12H   thiamine  100 mg Oral Daily   Continuous Infusions:  ceFEPime (MAXIPIME) IV 2 g (07/02/22 0934)   DAPTOmycin (CUBICIN) 700 mg in sodium chloride 0.9 % IVPB 700 mg (07/01/22 2052)     LOS: 10 days    Time spent: 35 mins    Chara Marquard, MD [facility]   If 7PM-7AM, please contact night-coverage

## 2022-07-02 NOTE — Progress Notes (Signed)
Mobility Specialist Criteria Algorithm Info.    07/02/22 1015  Mobility  Activity Refused mobility (Just finished working with PT)   Will f/u as time permits  [place] Kasaundra Fahrney, CMS, BS EXP Acute Rehabilitation Services  Phone:[phone] Office: [phone]

## 2022-07-02 NOTE — Progress Notes (Addendum)
Physical Therapy Treatment Patient Details Name: Jesse [name] MRN: [id number] DOB: 1961/06/30 Today's Date: 07/02/2022   History of Present Illness 61 y/o male presented to ED on 06/22/22 for AMS and possible sepsis. Thumb size hole in R foot after stepping on nail 6 months ago noted in ED. MRI showed edema at base of second metatarsal consistent more with Charcot changes than osteomyelitis. Admitted for sepsis and R calf cellulitis. PMH: T2DM, HTN, alcohol abuse    PT Comments    Pt with good progress today. Pt able to increase gait distance and speed as well as complete step training effectively (both with RW). Pt will continue to benefit from skilled, acute care physical therapy interventions to maximize his strength, mobility, and current level of function for progression towards established goals.   Recommendations for follow up therapy are one component of a multi-disciplinary discharge planning process, led by the attending physician.  Recommendations may be updated based on patient status, additional functional criteria and insurance authorization.  Follow Up Recommendations  Home health PT     Assistance Recommended at Discharge Frequent or constant Supervision/Assistance  Patient can return home with the following A little help with walking and/or transfers;A little help with bathing/dressing/bathroom;Assistance with cooking/housework;Direct supervision/assist for medications management;Direct supervision/assist for financial management;Assist for transportation   Equipment Recommendations  Rolling walker (2 wheels);Wheelchair (measurements PT);Wheelchair cushion (measurements PT)    Recommendations for Other Services       Precautions / Restrictions Precautions Precautions: Fall Precaution Comments: B LE wounds Restrictions Weight Bearing Restrictions: No     Mobility  Bed Mobility Overal bed mobility: Modified Independent                  Transfers Overall  transfer level: Needs assistance Equipment used: Rolling walker (2 wheels) Transfers: Sit to/from Stand, Bed to chair/wheelchair/BSC Sit to Stand: Min guard   Step pivot transfers: Min guard       General transfer comment: Pt with accurate hand placements. CGA for safety. Bed to chair performed after gait training.    Ambulation/Gait Ambulation/Gait assistance: Min guard, Supervision Gait Distance (Feet): 125 Feet Assistive device: Rolling walker (2 wheels) Gait Pattern/deviations: Decreased step length - left, Decreased step length - right, Antalgic, Decreased stance time - right, Step-to pattern, Step-through pattern, Trunk flexed Gait velocity: decreased Gait velocity interpretation: <1.31 ft/sec, indicative of household ambulator   General Gait Details: Pt able to progress well from step to pattern to step through pattern. No LOB occurred.   Stairs Stairs: Yes Stairs assistance: Min guard Stair Management: No rails, Forwards, With walker, Step to pattern Number of Stairs: 1 General stair comments: Pt ascended/descended platform step as instructed. Pt reports only having 1 step at home.   Wheelchair Mobility    Modified Rankin (Stroke Patients Only)       Balance Overall balance assessment: Needs assistance Sitting-balance support: No upper extremity supported, Feet supported Sitting balance-Leahy Scale: Good     Standing balance support: Bilateral upper extremity supported, Reliant on assistive device for balance Standing balance-Leahy Scale: Fair                              Cognition Arousal/Alertness: Awake/alert Behavior During Therapy: Flat affect Overall Cognitive Status: Impaired/Different from baseline Area of Impairment: Attention, Memory, Safety/judgement, Awareness, Problem solving                   Current Attention Level: Sustained  Memory: Decreased short-term memory   Safety/Judgement: Decreased awareness of deficits,  Decreased awareness of safety Awareness: Intellectual Problem Solving: Decreased initiation, Difficulty sequencing, Requires verbal cues, Requires tactile cues, Slow processing General Comments: Pt with intermittent confusion.        Exercises      General Comments General comments (skin integrity, edema, etc.): HR 82 and SpO2 98% on RA      Pertinent Vitals/Pain Pain Assessment Pain Assessment: 0-10 Pain Score: 8  Pain Location: R LE Pain Descriptors / Indicators: Aching, Grimacing Pain Intervention(s): Monitored during session, Limited activity within patient's tolerance    Home Living                          Prior Function            PT Goals (current goals can now be found in the care plan section) Acute Rehab PT Goals Patient Stated Goal: did not state PT Goal Formulation: With family Time For Goal Achievement: 07/11/22 Potential to Achieve Goals: Good Progress towards PT goals: Progressing toward goals    Frequency    Min 3X/week      PT Plan Current plan remains appropriate    Co-evaluation              AM-PAC PT "6 Clicks" Mobility   Outcome Measure  Help needed turning from your back to your side while in a flat bed without using bedrails?: None Help needed moving from lying on your back to sitting on the side of a flat bed without using bedrails?: None Help needed moving to and from a bed to a chair (including a wheelchair)?: A Little Help needed standing up from a chair using your arms (e.g., wheelchair or bedside chair)?: A Little Help needed to walk in hospital room?: A Little Help needed climbing 3-5 steps with a railing? : A Little 6 Click Score: 20    End of Session Equipment Utilized During Treatment: Gait belt Activity Tolerance: Patient tolerated treatment well;Patient limited by pain Patient left: in chair;with call bell/phone within reach;with chair alarm set Nurse Communication: Mobility status PT Visit Diagnosis:  Unsteadiness on feet (R26.81);Muscle weakness (generalized) (M62.81);Difficulty in walking, not elsewhere classified (R26.2)     Time: 6734-1937 PT Time Calculation (min) (ACUTE ONLY): 20 min  Charges:  $Gait Training: 8-22 mins                     Tana Coast, PT    [employer] 07/02/2022, 8:45 AM

## 2022-07-03 ENCOUNTER — Other Ambulatory Visit (HOSPITAL_COMMUNITY): Payer: Self-pay

## 2022-07-03 DIAGNOSIS — I87331 Chronic 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: Secondary | ICD-10-CM

## 2022-07-03 DIAGNOSIS — M1460 Charcot's joint, unspecified site: Secondary | ICD-10-CM

## 2022-07-03 LAB — COMPREHENSIVE METABOLIC PANEL
ALT: 18 U/L (ref 0–44)
AST: 20 U/L (ref 15–41)
Albumin: 1.8 g/dL — ABNORMAL LOW (ref 3.5–5.0)
Alkaline Phosphatase: 90 U/L (ref 38–126)
Anion gap: 6 (ref 5–15)
BUN: 31 mg/dL — ABNORMAL HIGH (ref 6–20)
CO2: 16 mmol/L — ABNORMAL LOW (ref 22–32)
Calcium: 8.9 mg/dL (ref 8.9–10.3)
Chloride: 111 mmol/L (ref 98–111)
Creatinine, Ser: 1.51 mg/dL — ABNORMAL HIGH (ref 0.61–1.24)
GFR, Estimated: 53 mL/min — ABNORMAL LOW (ref >60)
Glucose, Bld: 129 mg/dL — ABNORMAL HIGH (ref 70–99)
Potassium: 4.7 mmol/L (ref 3.5–5.1)
Sodium: 133 mmol/L — ABNORMAL LOW (ref 135–145)
Total Bilirubin: 0.5 mg/dL (ref 0.3–1.2)
Total Protein: 7.8 g/dL (ref 6.5–8.1)

## 2022-07-03 LAB — CBC WITH DIFFERENTIAL/PLATELET
Abs Immature Granulocytes: 0 10*3/uL (ref 0.00–0.07)
Basophils Absolute: 0.1 10*3/uL (ref 0.0–0.1)
Basophils Relative: 1 %
Eosinophils Absolute: 0 10*3/uL (ref 0.0–0.5)
Eosinophils Relative: 0 %
HCT: 31.6 % — ABNORMAL LOW (ref 39.0–52.0)
Hemoglobin: 10.5 g/dL — ABNORMAL LOW (ref 13.0–17.0)
Lymphocytes Relative: 16 %
Lymphs Abs: 2 10*3/uL (ref 0.7–4.0)
MCH: 34.5 pg — ABNORMAL HIGH (ref 26.0–34.0)
MCHC: 33.2 g/dL (ref 30.0–36.0)
MCV: 103.9 fL — ABNORMAL HIGH (ref 80.0–100.0)
Monocytes Absolute: 1 10*3/uL (ref 0.1–1.0)
Monocytes Relative: 8 %
Neutro Abs: 9.2 10*3/uL — ABNORMAL HIGH (ref 1.7–7.7)
Neutrophils Relative %: 75 %
Platelets: 263 10*3/uL (ref 150–400)
RBC: 3.04 MIL/uL — ABNORMAL LOW (ref 4.22–5.81)
RDW: 13.9 % (ref 11.5–15.5)
WBC: 12.3 10*3/uL — ABNORMAL HIGH (ref 4.0–10.5)
nRBC: 0 % (ref 0.0–0.2)
nRBC: 0 /100 WBC

## 2022-07-03 LAB — GLUCOSE, CAPILLARY
Glucose-Capillary: 136 mg/dL — ABNORMAL HIGH (ref 70–99)
Glucose-Capillary: 125 mg/dL — ABNORMAL HIGH (ref 70–99)

## 2022-07-03 LAB — MAGNESIUM: Magnesium: 1.9 mg/dL (ref 1.7–2.4)

## 2022-07-03 MED ORDER — CIPROFLOXACIN HCL 500 MG PO TABS
500.0000 mg | ORAL_TABLET | Freq: Two times a day (BID) | ORAL | 0 refills | Status: DC
  Filled 2022-07-03: qty 28, 14d supply, fill #0

## 2022-07-03 MED ORDER — CIPROFLOXACIN HCL 500 MG PO TABS
500.0000 mg | ORAL_TABLET | Freq: Two times a day (BID) | ORAL | Status: DC
  Administered 2022-07-03: 500 mg via ORAL
  Filled 2022-07-03: qty 1

## 2022-07-03 MED ORDER — INSULIN PEN NEEDLE 32G X 4 MM MISC
0 refills | Status: DC
  Filled 2022-07-03: qty 100, 30d supply, fill #0

## 2022-07-03 MED ORDER — GERHARDT'S BUTT CREAM
1.0000 | TOPICAL_CREAM | Freq: Three times a day (TID) | CUTANEOUS | 2 refills | Status: DC
  Filled 2022-07-03: qty 30, 10d supply, fill #0

## 2022-07-03 MED ORDER — INSULIN PEN NEEDLE 32G X 4 MM MISC
1 refills | Status: AC
Start: 2022-07-03 — End: ?
  Filled 2022-07-03: qty 100, fill #0

## 2022-07-03 MED ORDER — DOXYCYCLINE HYCLATE 100 MG PO TABS
100.0000 mg | ORAL_TABLET | Freq: Two times a day (BID) | ORAL | 0 refills | Status: DC
Start: 2022-07-03 — End: 2022-07-10
  Filled 2022-07-03: qty 28, 14d supply, fill #0

## 2022-07-03 MED ORDER — INSULIN GLARGINE 100 UNIT/ML SOLOSTAR PEN
10.0000 [IU] | PEN_INJECTOR | Freq: Every day | SUBCUTANEOUS | 3 refills | Status: DC
  Filled 2022-07-03: qty 3, 30d supply, fill #0

## 2022-07-03 MED ORDER — THIAMINE HCL 100 MG PO TABS
100.0000 mg | ORAL_TABLET | Freq: Every day | ORAL | 0 refills | Status: DC
  Filled 2022-07-03: qty 30, 30d supply, fill #0

## 2022-07-03 MED ORDER — DOXYCYCLINE HYCLATE 100 MG PO TABS
100.0000 mg | ORAL_TABLET | Freq: Two times a day (BID) | ORAL | Status: DC
  Administered 2022-07-03: 100 mg via ORAL
  Filled 2022-07-03: qty 1

## 2022-07-03 NOTE — Progress Notes (Signed)
Reviewed AVS with patient and family member.  Patient wheelchair and walker given to patient for home use.  Medications provided by [facility] given to sister.  Questions answered.  Wound care explained to patient and sister.  Patient taken down by wheelchair and went home via private auto by sister.

## 2022-07-03 NOTE — Discharge Summary (Signed)
Physician Discharge Summary   Patient: Jesse [name] MRN: [id number] DOB: 1961-04-19  Admit date:     06/22/2022  Discharge date: 07/03/22  Discharge Physician: Thad Ranger, MD    PCP: Pcp, No   Recommendations at discharge:   Continue p.o. doxycycline and ciprofloxacin to complete 2-week course  Discharge Diagnoses:  Severe sepsis (HCC)  due to diabetic foot infection, right   Charcot's arthropathy   Leukocytosis   DM (diabetes mellitus) (HCC)   AKI (acute kidney injury) (HCC)   HTN (hypertension)   HLD (hyperlipidemia)   Osteomyelitis (HCC)   Cellulitis in diabetic foot (HCC)   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)   Severe protein-calorie malnutrition [facility])   Hospital Course: This 61 yo male with PMH DMII, noncompliance, ethanol use, hypertension and dyslipidemia who presented to the ED with altered mental status. He was found to have severe sepsis with diabetic foot ulcers, osteomyelitis, cellulitis and myositis as well as AKI.  Seen in the ED few days prior for same, left AMA then came to ED again.   Assessment and Plan:  Severe sepsis due to diabetic right foot infection, Charcot's arthropathy Right leg cellulitis/ulcer with venous insufficiency -MRI findings suspicious for second TMT joint septic arthritis with osteomyelitis of the second metatarsal and intermediate cuneiform.  Orthopedics and ID were consulted -Sepsis physiology resolved.  Patient was placed on IV cefepime, daptomycin, Flagyl while inpatient. -Patient was evaluated by Dr. Lajoyce Corners on 8/17, felt more consistent with Charcot arthropathy rather than osteomyelitis (noted with chronic OM of left great toe as well) -Transition to p.o. doxycycline and ciprofloxacin to complete 2-week course, outpatient follow-up with wound care and Dr. Lajoyce Corners -Patient now declining unna boot  -TOC assistance with DME and wound care clinic   GRAM variable rods  blood cultures 1/2 -Likely contaminant, ID has been following   AKI on CKD stage IIIa -Presented with creatinine of 4.7, baseline 1.6 -Likely worsened due to sepsis, ATN, resolved, creatinine 1.5 at discharge -Renal ultrasound without necrosis, dysplastic left kidney   Diabetes mellitus type 2, uncontrolled with hyperglycemia -Hemoglobin A1c 8.3, continue outpatient insulin regimen   Acute metabolic encephalopathy -Acute due to severe sepsis, AKI, improving, appears back to baseline  Alcohol use -Continue thiamine, no withdrawals currently noted    Essential hypertension -BP now stable  Hypomagnesemia -Replaced    Pain control - [place] Controlled Substance Reporting System database was reviewed. and patient was instructed, not to drive, operate heavy machinery, perform activities at heights, swimming or participation in water activities or provide baby-sitting services while on Pain, Sleep and Anxiety Medications; until their outpatient Physician has advised to do so again. Also recommended to not to take more than prescribed Pain, Sleep and Anxiety Medications.  Consultants: ID, orthopedics Procedures performed: None Disposition: Home Diet recommendation:  Discharge Diet Orders (From admission, onward)     Start     Ordered   07/03/22 0000  Diet Carb Modified        07/03/22 1028           Carb modified diet DISCHARGE MEDICATION: Allergies as of 07/03/2022   No Known Allergies      Medication List     STOP taking these medications    atorvastatin 40 MG tablet Commonly known as: LIPITOR   lidocaine 2 % solution Commonly known as: XYLOCAINE   losartan 25 MG tablet Commonly known as: COZAAR   silver sulfADIAZINE 1 % cream Commonly  known as: Silvadene       TAKE these medications    ciprofloxacin 500 MG tablet Commonly known as: CIPRO Take 1 tablet (500 mg total) by mouth 2 (two) times daily for 14 days.   doxycycline 100 MG  tablet Commonly known as: VIBRA-TABS Take 1 tablet (100 mg total) by mouth 2 (two) times daily for 14 days.   Gerhardt's butt cream Crea Apply 1 Application topically 3 (three) times daily. Apply to inner thighs and buttocks liberally   Insulin Pen Needle 32G X 4 MM Misc Use as directed with insulin pen   Lantus SoloStar 100 UNIT/ML Solostar Pen Generic drug: insulin glargine INJECT 10 UNITS INTO THE SKIN DAILY.   thiamine 100 MG tablet Commonly known as: VITAMIN B1 Take 1 tablet (100 mg total) by mouth daily. Start taking on: July 04, 2022               Durable Medical Equipment  (From admission, onward)           Start     Ordered   07/01/22 1532  For home use only DME Walker rolling  Once       Question Answer Comment  Walker: With 5 Inch Wheels   Patient needs a walker to treat with the following condition Decreased functional mobility and endurance      07/01/22 1531   07/01/22 1531  For home use only DME lightweight manual wheelchair with seat cushion  Once       Comments: Patient suffers from sepsis, venous ulcers which impairs their ability to perform daily activities like bathing, dressing, and toileting in the home.  A walker will not resolve  issue with performing activities of daily living. A wheelchair will allow patient to safely perform daily activities. Patient is not able to propel themselves in the home using a standard weight wheelchair due to endurance and general weakness. Patient can self propel in the lightweight wheelchair. Length of need 6 months . Accessories: elevating leg rests (ELRs), wheel locks, extensions and anti-tippers.   07/01/22 1531              Discharge Care Instructions  (From admission, onward)           Start     Ordered   07/03/22 0000  Discharge wound care:       Comments: Wound care Monday and Thursday. Remove Unna boot and wash the RLE with soap and water, rinse and pat dry. Place non-adherent guaze over  the open areas then wrap with a few turns of Kerlix to hold in place. After dressing applied,  replace Unna boot.   07/03/22 1028            Follow-up Information     Nadara Mustard, MD Follow up in 1 week(s).   Specialty: Orthopedic Surgery Contact information: [address] [place] [address] [phone]         [facility]             . Call on 07/18/2022.   Why: 9 am with Dr. Phil Dopp information: [address] [place] [address] [phone]        [facility] Follow up on 07/17/2022.   Specialty: Family Medicine Why: 2:30 pm Contact information: [address] [place] [address] [phone]               Discharge Exam:  Filed Weights   06/23/22 0500  Weight: 107.5 kg   S: Somewhat anxious, otherwise no acute complaints, no fevers  Vitals:   07/01/22 1741 07/02/22 1000 07/02/22 1900 07/03/22 0818  BP: 136/73 125/79 131/83   Pulse: 82 88 80 82  Resp: 17 (!) 24 20   Temp: (!) 97.5 F (36.4 C)  98.3 F (36.8 C) 98.5 F (36.9 C)  TempSrc: Oral  Oral Oral  SpO2: 100% 100% 100% 100%  Weight:      Height:        Physical Exam General: Alert and oriented x 3, NAD, anxious Cardiovascular: S1 S2 clear, RRR.  Respiratory: CTAB, no wheezing, rales or rhonchi Gastrointestinal: Soft, nontender, nondistended, NBS Ext: n right foot dressing intact Neuro: no new deficits Psych: anxious    Condition at discharge: fair  The results of significant diagnostics from this hospitalization (including imaging, microbiology, ancillary and laboratory) are listed below for reference.   Imaging Studies: VAS [place] ABI WITH/WO TBI  Result Date: 06/24/2022  LOWER EXTREMITY DOPPLER STUDY Patient Name:  Jesse [name]  Date of Exam:   06/24/2022 Medical Rec #: [id number]         Accession #:    [id number] Date of Birth: 14-May-1961          Patient  Gender: M Patient Age:   43 years Exam [facility]:  [facility] Procedure:      VAS [place] ABI WITH/WO TBI Referring Phys: Rosiland Oz --------------------------------------------------------------------------------  Indications: Rest pain. High Risk Factors: Hypertension, hyperlipidemia, Diabetes.  Limitations: Today's exam was limited due to an open wound. Comparison Study: 02/08/2021 - Summary:                   Right: Resting right ankle-brachial index indicates                   noncompressible right                   lower extremity arteries.                    Unable to obtain TBI secondary to toe size/ulceration.                   Left: Resting left ankle-brachial index indicates                   noncompressible left                   lower extremity arteries.                    Unable to obtain TBI secondary to toe size. Performing Technologist: Carlos Levering RVT  Examination Guidelines: A complete evaluation includes at minimum, Doppler waveform signals and systolic blood pressure reading at the level of bilateral brachial, anterior tibial, and posterior tibial arteries, when vessel segments are accessible. Bilateral testing is considered an integral part of a complete examination. Photoelectric Plethysmograph (PPG) waveforms and toe systolic pressure readings are included as required and additional duplex testing as needed. Limited examinations for reoccurring indications may be performed as noted.  ABI Findings: +---------+------------------+-----+-----------+--------+ Right    Rt Pressure (mmHg)IndexWaveform   Comment  +---------+------------------+-----+-----------+--------+ Brachial 154                    triphasic           +---------+------------------+-----+-----------+--------+ PTA  198               1.29 multiphasic         +---------+------------------+-----+-----------+--------+ DP       165               1.07 multiphasic          +---------+------------------+-----+-----------+--------+ Great Toe83                0.54                     +---------+------------------+-----+-----------+--------+ +---------+------------------+-----+-----------+----------+ Left     Lt Pressure (mmHg)IndexWaveform   Comment    +---------+------------------+-----+-----------+----------+ Brachial 145                    triphasic             +---------+------------------+-----+-----------+----------+ PTA      204               1.32 multiphasic           +---------+------------------+-----+-----------+----------+ DP       190               1.23 multiphasic           +---------+------------------+-----+-----------+----------+ Great Toe                                  Ulceration +---------+------------------+-----+-----------+----------+ +-------+-----------+-----------+------------+------------+ ABI/TBIToday's ABIToday's TBIPrevious ABIPrevious TBI +-------+-----------+-----------+------------+------------+ Right  1.29       0.54                                +-------+-----------+-----------+------------+------------+ Left   1.32                                           +-------+-----------+-----------+------------+------------+  Summary: Right: Resting right ankle-brachial index is within normal range. The right toe-brachial index is abnormal. Left: Resting left ankle-brachial index indicates noncompressible left lower extremity arteries. Unable to obtain TBI due to great toe ulceration. *See table(s) above for measurements and observations.  Electronically signed by Servando Snare MD on 06/24/2022 at 2:21:06 PM.    Final    US Abdomen Limited RUQ (LIVER/GB)  Result Date: 06/23/2022 CLINICAL DATA:  Transaminitis. EXAM: ULTRASOUND ABDOMEN LIMITED RIGHT UPPER QUADRANT COMPARISON:  None Available. FINDINGS: Gallbladder: No gallstones or wall thickening visualized. Gallbladder sludge is present. No sonographic  Murphy sign noted by sonographer. Common bile duct: Diameter: 3.3 mm Liver: No focal lesion identified. Increase in parenchymal echogenicity. Portal vein is patent on color Doppler imaging with normal direction of blood flow towards the liver. Other: None. IMPRESSION: 1. Gallbladder sludge. 2. No cholelithiasis. 3. Echogenic liver likely related to diffuse fatty infiltration. Electronically Signed   By: Ronney Asters M.D.   On: 06/23/2022 20:39   MRI Right foot without contrast  Result Date: 06/23/2022 CLINICAL DATA:  Diabetic foot wounds EXAM: MRI OF THE RIGHT FOREFOOT WITHOUT CONTRAST TECHNIQUE: Multiplanar, multisequence MR imaging of the right forefoot was performed. No intravenous contrast was administered. COMPARISON:  X-ray 06/20/2022, MRI 04/14/2021 FINDINGS: Bones/Joint/Cartilage Small second tarsometatarsal joint effusion. Bone marrow edema within the second metatarsal base extending to the level of the distal diaphysis with confluent low T1 signal changes in  the second metatarsal base (series 6, image 14). There is patchy bone marrow edema and intermediate T1 signal within the adjacent intermediate cuneiform. Findings are suspicious for septic arthritis with osteomyelitis. Patchy areas of marrow edema are also present within the navicular, medial and lateral cuneiform bones, and second metatarsal base. No focal erosion or confluent low T1 signal changes at these locations to suggest acute osteomyelitis at this time. No acute fracture or dislocation. Susceptibility artifact between the second and third toes as well as between the third and fourth toes with resultant poor regional fat saturation. Ligaments Intact Lisfranc ligament.  Collateral ligaments appear intact. Muscles and Tendons Findings of myositis and/or denervation. No large tenosynovial fluid collection. Soft tissues Plantar soft tissue ulceration underlying the midfoot. Prominent dorsal subcutaneous edema. No organized fluid collection is  evident on noncontrast imaging. Additional site of susceptibility artifact within the plantar soft tissues adjacent to the site of ulceration corresponding to surgical clips seen radiographically. IMPRESSION: 1. 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 2. Patchy areas of marrow edema within the midfoot without focal erosion which could represent reactive osteitis or stress-related changes. 3. Plantar soft tissue ulceration underlying the midfoot. 4. Marked dorsal subcutaneous edema and likely cellulitis. No organized fluid collections. 5. Findings of diffuse myositis and/or denervation. Electronically Signed   By: Davina Poke D.O.   On: 06/23/2022 13:06   US RENAL  Result Date: 06/23/2022 CLINICAL DATA:  Acute renal injury EXAM: RENAL / URINARY TRACT ULTRASOUND COMPLETE COMPARISON:  CT from 02/09/2021 FINDINGS: Right Kidney: Renal measurements: 14.7 x 7.4 x 7.3 cm. = volume: 416 mL. Echogenicity within normal limits. No mass or hydronephrosis visualized. Left Kidney: Dysplastic left kidney is not visualized. Bladder: Appears normal for degree of bladder distention. Other: None. 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 No acute abnormality noted. Electronically Signed   By: Inez Catalina M.D.   On: 06/23/2022 01:07   DG Chest Port 1 View  Result Date: 06/22/2022 CLINICAL DATA:  Questionable sepsis.  Altered mental status. EXAM: PORTABLE CHEST 1 VIEW COMPARISON:  Portable chest 04/14/2021 FINDINGS: The cardiac size is normal. No vascular congestion is seen. There is a normal mediastinal configuration. Again noted is prominent elevation of the right hemidiaphragm with overlying linear atelectasis. This obscures the right lower lung field the visualized lungs are clear. The sulci are sharp. Thoracic spondylosis. IMPRESSION: The visualized lungs are clear, but with obscuration of the right lower  lung field due to a chronically elevated right hemidiaphragm. The cardiac size is normal. Electronically Signed   By: Telford Nab M.D.   On: 06/22/2022 21:36   DG Tibia/Fibula Left  Result Date: 06/20/2022 CLINICAL DATA:  Right lower extremity cellulitis. EXAM: LEFT TIBIA AND FIBULA - 2 VIEW COMPARISON:  None Available. FINDINGS: There is no evidence of fracture or other focal bone lesions. No lytic destruction is noted. Soft tissues are unremarkable. IMPRESSION: Negative. Electronically Signed   By: Marijo Conception M.D.   On: 06/20/2022 16:53   DG Tibia/Fibula Right  Result Date: 06/20/2022 CLINICAL DATA:  Right lower extremity cellulitis. EXAM: RIGHT TIBIA AND FIBULA - 2 VIEW COMPARISON:  None Available. FINDINGS: There is no evidence of fracture or other focal bone lesions. No lytic destruction is seen. Soft tissues are unremarkable. IMPRESSION: Negative. Electronically Signed   By: Marijo Conception M.D.   On: 06/20/2022 16:52   DG Foot Complete Right  Result Date:  06/20/2022 CLINICAL DATA:  Cellulitis. EXAM: RIGHT FOOT COMPLETE - 3+ VIEW COMPARISON:  None Available. FINDINGS: There is no evidence of fracture or dislocation. There is no evidence of arthropathy or other focal bone abnormality. Dorsal soft tissue swelling is noted. Large ulceration is seen involving the plantar soft tissues. Metallic staple is seen in this area concerning for foreign body. IMPRESSION: Dorsal soft tissue swelling is noted concerning for cellulitis. Large ulceration is seen involving the plantar soft tissues with adjacent metallic staple in this area concerning for foreign body. No definite lytic destruction is seen to suggest osteomyelitis. Electronically Signed   By: Marijo Conception M.D.   On: 06/20/2022 16:51   DG Foot Complete Left  Result Date: 06/20/2022 CLINICAL DATA:  Possible osteomyelitis.  Cellulitis. EXAM: LEFT FOOT - COMPLETE 3+ VIEW COMPARISON:  None Available. FINDINGS: Large ulceration is seen  overlying the first distal phalanx. There appears to be lytic destruction involving the distal tuft of the first distal phalanx consistent with osteomyelitis. No fracture or dislocation is noted. IMPRESSION: Large ulceration involving the first toe with underlying lytic destruction of the distal tuft of first distal phalanx consistent with osteomyelitis. Electronically Signed   By: Marijo Conception M.D.   On: 06/20/2022 16:49    Microbiology: Results for orders placed or performed during the hospital encounter of 06/22/22  Blood Culture (routine x 2)     Status: None   Collection Time: 06/22/22  8:00 PM   Specimen: BLOOD  Result Value Ref Range Status   Specimen Description BLOOD LEFT ANTECUBITAL  Final   Special Requests   Final    BOTTLES DRAWN AEROBIC AND ANAEROBIC Blood Culture results may not be optimal due to an excessive volume of blood received in culture bottles   Culture   Final    NO GROWTH 5 DAYS Performed at [facility], [place] [address], [place], [place] [address]    Report Status 06/27/2022 FINAL  Final  Blood Culture (routine x 2)     Status: None   Collection Time: 06/22/22  8:10 PM   Specimen: BLOOD  Result Value Ref Range Status   Specimen Description BLOOD RIGHT ANTECUBITAL  Final   Special Requests   Final    BOTTLES DRAWN AEROBIC AND ANAEROBIC Blood Culture adequate volume   Culture   Final    NO GROWTH 5 DAYS Performed at [facility], [place] [address] [place], [place] [address]    Report Status 06/27/2022 FINAL  Final  Urine Culture     Status: Abnormal   Collection Time: 06/22/22  9:21 PM   Specimen: In/Out Cath Urine  Result Value Ref Range Status   Specimen Description IN/OUT CATH URINE  Final   Special Requests NONE  Final   Culture (A)  Final    <10,000 COLONIES/mL INSIGNIFICANT GROWTH Performed at [facility], [place] [address], [place], [place] [address]    Report Status 06/24/2022 FINAL  Final    Labs: CBC: Recent Labs  Lab  06/29/22 0108 06/30/22 0234 07/01/22 0209 07/02/22 0615 07/03/22 0223  WBC 16.1* 15.8* 15.7* 12.7* 12.3*  NEUTROABS 11.0* 11.4* 11.7* 9.0* 9.2*  HGB 11.2* 11.4* 11.1* 10.5* 10.5*  HCT 33.6* 34.7* 34.5* 31.5* 31.6*  MCV 103.1* 103.3* 104.2* 104.3* 103.9*  PLT 244 230 261 265 [id number]   Basic Metabolic Panel: Recent Labs  Lab 06/29/22 0108 06/30/22 0234 07/01/22 0209 07/02/22 0615 07/03/22 0223  NA 136 136 135 135 133*  K 3.9 4.4 4.3 4.7 4.7  CL 112* 111 110 111 111  CO2 17* 16* 17* 16* 16*  GLUCOSE 96 110* 100* 119* 129*  BUN 24* 21* 28* 29* 31*  CREATININE 1.22 1.20 1.28* 1.26* 1.51*  CALCIUM 8.5* 8.8* 9.0 8.9 8.9  MG 1.6* 1.8 1.7 1.6* 1.9   Liver Function Tests: Recent Labs  Lab 06/29/22 0108 06/30/22 0234 07/01/22 0209 07/02/22 0615 07/03/22 0223  AST 25 23 19 18 20   ALT 24 21 19 17 18   ALKPHOS 131* 122 108 97 90  BILITOT 0.5 0.5 0.9 0.4 0.5  PROT 7.8 8.1 8.0 7.7 7.8  ALBUMIN 1.9* 1.9* 1.9* 1.9* 1.8*   CBG: Recent Labs  Lab 07/02/22 1107 07/02/22 1621 07/02/22 2008 07/03/22 0815 07/03/22 1152  GLUCAP 141* 139* 166* 136* 125*    Discharge time spent: greater than 30 minutes.  Signed: Estill Cotta, MD [facility] 07/03/2022

## 2022-07-03 NOTE — Plan of Care (Signed)
  Problem: Respiratory: Goal: Ability to maintain adequate ventilation will improve Outcome: Progressing   Problem: Education: Goal: Knowledge of General Education information will improve Description: Including pain rating scale, medication(s)/side effects and non-pharmacologic comfort measures Outcome: Progressing   Problem: Health Behavior/Discharge Planning: Goal: Ability to manage health-related needs will improve Outcome: Progressing   

## 2022-07-03 NOTE — TOC Transition Note (Addendum)
Transition of Care [facility]) - CM/SW Discharge Note   Patient Details  Name: Jesse [name] MRN: [id number] Date of Birth: 02-18-1961  Transition of Care [facility]) CM/SW Contact:  Epifanio Lesches, RN Phone Number: 07/03/2022, 10:35 AM   Clinical Narrative:    Patient will DC to: home Anticipated DC date: 07/03/2022 Family notified: Jesse [name] (sister) Transport by: car  Per MD patient ready for DC today. RN, patient, and patient's sister notified of DC. Sister to assist with care once d/c. Acceptance pending with [facility] for home health RN,PT, SW services. DME , RW and W/C will be delivered to bedside prior to d/c by [facility].  TOC pharmacy to fill and deliver Rx meds to bedside prior to d/c. Match Letter provided to assist with medication cost.  Post hospital f/u and to establish primary care noted on AVS:  Hospital Follow Up with Jesse Sessions, NP Wednesday Jul 17, 2022 2:30 PM Please arrive 15 minutes prior to your appointment. [facility] Jesse [name]  [place] [id number] [phone]   [facility]. Appointment time noted on AVS for 07/18/2022 at 9 am.   Jesse [name] to provide transportation to  home.    07/03/2022 11:42 am Jesse [name] with [facility] called and informed NCM pt declined for  charity home health service 2/2 noncompliance, MD made aware. NCM will continue to search... Referral made with [facility] for charity [facility] services, acceptance  pending    RNCM will sign off for now as intervention is no longer needed. Please consult [place] again if new needs arise.   Final next level of care: Home w Home Health Services Barriers to Discharge: No Barriers Identified   Patient Goals and CMS Choice Patient states their goals for this hospitalization and ongoing recovery are:: return home to his apartment CMS Medicare.gov Compare Post Acute Care list provided to:: Patient Choice offered to / list presented to :  Patient  Discharge Placement                       Discharge Plan and Services In-house Referral: PCP / Health Connect, Clinical Social Work Discharge Planning Services: CM Consult Post Acute Care Choice: Durable Medical Equipment          DME Arranged: Community education officer wheelchair with seat cushion, Walker rolling DME Agency: [facility] Date DME Agency Contacted: 07/03/22 Time DME Agency Contacted: [phone] Representative spoke with at DME Agency: Jesse [name] HH Arranged: RN, PT, Social Work [employer] Agency: [facility] Date [facility] Agency Contacted: 07/02/22 Time HH Agency Contacted: [phone] Representative spoke with at [facility] Agency: Jesse [name]  Social Determinants of Health (SDOH) Interventions     Readmission Risk Interventions     No data to display

## 2022-07-03 NOTE — Progress Notes (Signed)
Patient confirmed that unna boot was removed per his request due to dressing being too tight for him to tolerate. See nursing note from 06/28/22.

## 2022-07-04 ENCOUNTER — Emergency Department (HOSPITAL_COMMUNITY): Payer: Medicaid Other

## 2022-07-04 ENCOUNTER — Other Ambulatory Visit: Payer: Self-pay

## 2022-07-04 ENCOUNTER — Inpatient Hospital Stay (HOSPITAL_COMMUNITY)
Admission: EM | Admit: 2022-07-04 | Discharge: 2022-07-10 | DRG: 683 | Disposition: A | Discharge status: Home Health | Payer: Medicaid Other | Attending: Family Medicine | Admitting: Family Medicine

## 2022-07-04 DIAGNOSIS — R42 Dizziness and giddiness: Secondary | ICD-10-CM | POA: Diagnosis not present

## 2022-07-04 DIAGNOSIS — I951 Orthostatic hypotension: Secondary | ICD-10-CM | POA: Diagnosis present

## 2022-07-04 DIAGNOSIS — E11628 Type 2 diabetes mellitus with other skin complications: Secondary | ICD-10-CM | POA: Diagnosis present

## 2022-07-04 DIAGNOSIS — F1010 Alcohol abuse, uncomplicated: Secondary | ICD-10-CM | POA: Diagnosis present

## 2022-07-04 DIAGNOSIS — E875 Hyperkalemia: Secondary | ICD-10-CM

## 2022-07-04 DIAGNOSIS — I129 Hypertensive chronic kidney disease with stage 1 through stage 4 chronic kidney disease, or unspecified chronic kidney disease: Secondary | ICD-10-CM | POA: Diagnosis present

## 2022-07-04 DIAGNOSIS — N179 Acute kidney failure, unspecified: Principal | ICD-10-CM | POA: Diagnosis present

## 2022-07-04 DIAGNOSIS — E1122 Type 2 diabetes mellitus with diabetic chronic kidney disease: Secondary | ICD-10-CM | POA: Diagnosis present

## 2022-07-04 DIAGNOSIS — E1165 Type 2 diabetes mellitus with hyperglycemia: Secondary | ICD-10-CM | POA: Diagnosis present

## 2022-07-04 DIAGNOSIS — R531 Weakness: Secondary | ICD-10-CM

## 2022-07-04 DIAGNOSIS — E861 Hypovolemia: Secondary | ICD-10-CM | POA: Diagnosis present

## 2022-07-04 DIAGNOSIS — R3129 Other microscopic hematuria: Secondary | ICD-10-CM | POA: Diagnosis present

## 2022-07-04 DIAGNOSIS — Z794 Long term (current) use of insulin: Secondary | ICD-10-CM

## 2022-07-04 DIAGNOSIS — E872 Acidosis, unspecified: Secondary | ICD-10-CM

## 2022-07-04 DIAGNOSIS — E1140 Type 2 diabetes mellitus with diabetic neuropathy, unspecified: Secondary | ICD-10-CM

## 2022-07-04 DIAGNOSIS — R809 Proteinuria, unspecified: Secondary | ICD-10-CM | POA: Diagnosis present

## 2022-07-04 DIAGNOSIS — E119 Type 2 diabetes mellitus without complications: Secondary | ICD-10-CM

## 2022-07-04 DIAGNOSIS — N1831 Chronic kidney disease, stage 3a: Secondary | ICD-10-CM | POA: Diagnosis present

## 2022-07-04 DIAGNOSIS — E871 Hypo-osmolality and hyponatremia: Secondary | ICD-10-CM | POA: Diagnosis present

## 2022-07-04 DIAGNOSIS — E8809 Other disorders of plasma-protein metabolism, not elsewhere classified: Secondary | ICD-10-CM | POA: Diagnosis present

## 2022-07-04 LAB — CBC
HCT: 33.8 % — ABNORMAL LOW (ref 39.0–52.0)
Hemoglobin: 11 g/dL — ABNORMAL LOW (ref 13.0–17.0)
MCH: 34.4 pg — ABNORMAL HIGH (ref 26.0–34.0)
MCHC: 32.5 g/dL (ref 30.0–36.0)
MCV: 105.6 fL — ABNORMAL HIGH (ref 80.0–100.0)
Platelets: 333 10*3/uL (ref 150–400)
RBC: 3.2 MIL/uL — ABNORMAL LOW (ref 4.22–5.81)
RDW: 13.3 % (ref 11.5–15.5)
WBC: 12.8 10*3/uL — ABNORMAL HIGH (ref 4.0–10.5)
nRBC: 0 % (ref 0.0–0.2)

## 2022-07-04 LAB — CBG MONITORING, ED: Glucose-Capillary: 140 mg/dL — ABNORMAL HIGH (ref 70–99)

## 2022-07-04 LAB — CK: Total CK: 128 U/L (ref 49–397)

## 2022-07-04 LAB — LACTIC ACID, PLASMA: Lactic Acid, Venous: 0.9 mmol/L (ref 0.5–1.9)

## 2022-07-04 LAB — BLOOD GAS, VENOUS
Acid-base deficit: 6.1 mmol/L — ABNORMAL HIGH (ref 0.0–2.0)
Bicarbonate: 20.2 mmol/L (ref 20.0–28.0)
O2 Saturation: 28.4 %
Patient temperature: 36.4
pCO2, Ven: 41 mmHg — ABNORMAL LOW (ref 44–60)
pH, Ven: 7.3 (ref 7.25–7.43)
pO2, Ven: <31 mmHg — CL (ref 32–45)

## 2022-07-04 LAB — BASIC METABOLIC PANEL
Anion gap: 7 (ref 5–15)
BUN: 35 mg/dL — ABNORMAL HIGH (ref 6–20)
CO2: 16 mmol/L — ABNORMAL LOW (ref 22–32)
Calcium: 9.3 mg/dL (ref 8.9–10.3)
Chloride: 110 mmol/L (ref 98–111)
Creatinine, Ser: 2.02 mg/dL — ABNORMAL HIGH (ref 0.61–1.24)
GFR, Estimated: 37 mL/min — ABNORMAL LOW (ref >60)
Glucose, Bld: 153 mg/dL — ABNORMAL HIGH (ref 70–99)
Potassium: 5.2 mmol/L — ABNORMAL HIGH (ref 3.5–5.1)
Sodium: 133 mmol/L — ABNORMAL LOW (ref 135–145)

## 2022-07-04 LAB — ETHANOL: Alcohol, Ethyl (B): <10 mg/dL (ref <10)

## 2022-07-04 LAB — URINALYSIS, ROUTINE W REFLEX MICROSCOPIC
Bilirubin Urine: NEGATIVE
Glucose, UA: NEGATIVE mg/dL
Ketones, ur: NEGATIVE mg/dL
Nitrite: NEGATIVE
Protein, ur: 100 mg/dL — AB
Specific Gravity, Urine: 1.012 (ref 1.005–1.030)
pH: 5 (ref 5.0–8.0)

## 2022-07-04 LAB — GLUCOSE, CAPILLARY
Glucose-Capillary: 97 mg/dL (ref 70–99)
Glucose-Capillary: 133 mg/dL — ABNORMAL HIGH (ref 70–99)

## 2022-07-04 LAB — URIC ACID: Uric Acid, Serum: 5.3 mg/dL (ref 3.7–8.6)

## 2022-07-04 MED ORDER — CIPROFLOXACIN HCL 250 MG PO TABS
250.0000 mg | ORAL_TABLET | Freq: Two times a day (BID) | ORAL | Status: DC
Start: 2022-07-04 — End: 2022-07-05
  Administered 2022-07-04: 250 mg via ORAL
  Filled 2022-07-04: qty 1

## 2022-07-04 MED ORDER — THIAMINE HCL 100 MG PO TABS
100.0000 mg | ORAL_TABLET | Freq: Every day | ORAL | Status: DC
  Filled 2022-07-04: qty 1

## 2022-07-04 MED ORDER — ONDANSETRON HCL 4 MG/2ML IJ SOLN: 4.0000 mg | Freq: Four times a day (QID) | INTRAMUSCULAR | Status: DC | PRN

## 2022-07-04 MED ORDER — DOXYCYCLINE HYCLATE 100 MG PO TABS
100.0000 mg | ORAL_TABLET | Freq: Two times a day (BID) | ORAL | Status: DC
  Administered 2022-07-04 – 2022-07-10 (×12): 100 mg via ORAL
  Filled 2022-07-04 (×12): qty 1

## 2022-07-04 MED ORDER — BISACODYL 5 MG PO TBEC
5.0000 mg | DELAYED_RELEASE_TABLET | Freq: Every day | ORAL | Status: DC | PRN
  Administered 2022-07-08: 5 mg via ORAL
  Filled 2022-07-04: qty 1

## 2022-07-04 MED ORDER — LORAZEPAM 1 MG PO TABS: 1.0000 mg | ORAL_TABLET | ORAL | Status: DC | PRN

## 2022-07-04 MED ORDER — SENNOSIDES-DOCUSATE SODIUM 8.6-50 MG PO TABS: 1.0000 | ORAL_TABLET | Freq: Every evening | ORAL | Status: DC | PRN

## 2022-07-04 MED ORDER — ACETAMINOPHEN 325 MG PO TABS
650.0000 mg | ORAL_TABLET | Freq: Four times a day (QID) | ORAL | Status: DC | PRN
  Administered 2022-07-08: 650 mg via ORAL
  Filled 2022-07-04 (×2): qty 2

## 2022-07-04 MED ORDER — ONDANSETRON HCL 4 MG PO TABS: 4.0000 mg | ORAL_TABLET | Freq: Four times a day (QID) | ORAL | Status: DC | PRN

## 2022-07-04 MED ORDER — FOLIC ACID 1 MG PO TABS
1.0000 mg | ORAL_TABLET | Freq: Every day | ORAL | Status: DC
  Administered 2022-07-04: 1 mg via ORAL
  Filled 2022-07-04: qty 1

## 2022-07-04 MED ORDER — OXYCODONE HCL 5 MG PO TABS
5.0000 mg | ORAL_TABLET | ORAL | Status: DC | PRN
  Administered 2022-07-08 – 2022-07-09 (×3): 5 mg via ORAL
  Filled 2022-07-04 (×3): qty 1

## 2022-07-04 MED ORDER — THIAMINE HCL 100 MG/ML IJ SOLN
100.0000 mg | Freq: Every day | INTRAMUSCULAR | Status: DC
  Filled 2022-07-04: qty 2

## 2022-07-04 MED ORDER — LORAZEPAM 2 MG/ML IJ SOLN: 1.0000 mg | INTRAMUSCULAR | Status: DC | PRN

## 2022-07-04 MED ORDER — SODIUM ZIRCONIUM CYCLOSILICATE 10 G PO PACK
10.0000 g | PACK | Freq: Once | ORAL | Status: AC
  Administered 2022-07-04: 10 g via ORAL
  Filled 2022-07-04: qty 1

## 2022-07-04 MED ORDER — SODIUM CHLORIDE 0.9 % IV SOLN: INTRAVENOUS | Status: DC

## 2022-07-04 MED ORDER — HEPARIN SODIUM (PORCINE) 5000 UNIT/ML IJ SOLN
5000.0000 [IU] | Freq: Two times a day (BID) | INTRAMUSCULAR | Status: DC
  Administered 2022-07-04 – 2022-07-10 (×11): 5000 [IU] via SUBCUTANEOUS
  Filled 2022-07-04 (×13): qty 1

## 2022-07-04 MED ORDER — ACETAMINOPHEN 650 MG RE SUPP: 650.0000 mg | Freq: Four times a day (QID) | RECTAL | Status: DC | PRN

## 2022-07-04 MED ORDER — SODIUM BICARBONATE 650 MG PO TABS
650.0000 mg | ORAL_TABLET | Freq: Two times a day (BID) | ORAL | Status: DC
  Administered 2022-07-04 – 2022-07-06 (×5): 650 mg via ORAL
  Filled 2022-07-04 (×5): qty 1

## 2022-07-04 MED ORDER — INSULIN ASPART 100 UNIT/ML IJ SOLN
0.0000 [IU] | Freq: Three times a day (TID) | INTRAMUSCULAR | Status: DC
  Administered 2022-07-05: 1 [IU] via SUBCUTANEOUS
  Administered 2022-07-06: 2 [IU] via SUBCUTANEOUS
  Administered 2022-07-07 – 2022-07-08 (×3): 1 [IU] via SUBCUTANEOUS
  Administered 2022-07-09 – 2022-07-10 (×2): 2 [IU] via SUBCUTANEOUS
  Administered 2022-07-10: 1 [IU] via SUBCUTANEOUS

## 2022-07-04 MED ORDER — ADULT MULTIVITAMIN W/MINERALS CH
1.0000 | ORAL_TABLET | Freq: Every day | ORAL | Status: DC
  Administered 2022-07-04: 1 via ORAL
  Filled 2022-07-04: qty 1

## 2022-07-04 MED ORDER — THIAMINE MONONITRATE 100 MG PO TABS
100.0000 mg | ORAL_TABLET | Freq: Every day | ORAL | Status: DC
  Administered 2022-07-04: 100 mg via ORAL
  Filled 2022-07-04 (×2): qty 1

## 2022-07-04 NOTE — H&P (Signed)
History and Physical    Jesse [name] [id number] DOB: 07-04-1961 DOA: 07/04/2022  PCP: Pcp, No (Confirm with patient/family/NH records and if not entered, this has to be entered at [facility] point of entry) Patient coming from: Home  I have personally briefly reviewed patient's old medical records in [place]  Chief Complaint: Feeling weak  HPI: Jesse [name] is a 61 y.o. male with medical history significant of IDDM, recent right foot diabetic foot infection on chronic antibiotics, CKD stage IIIa, HTN, alcohol abuse, who was just discharged from a hospitalization yesterday presented with worsening of generalized weakness and dizziness.  Patient was recently hospitalized for right foot infection, he was discharged yesterday on p.o. antibiotics.  Patient reported that due to the right foot pain, he has had poor ambulation during the hospitalization.  Yesterday, he went home, however he found it was difficult for him to ambulate even with a roller walker.  He also reported has been feeling lightheadedness every time standing up or sitting up.  As a result, he has mainly remained lying in the bed for whole 24 hours without eating or drinking much.  This morning, p his neighbor went to have a welfare check and found him in the bed then called EMS.  He denies any fever chills, no nauseous vomiting abdominal pain or diarrhea.  He continues to complain about right foot pain but states that is not something new.  ED Course: Afebrile, blood pressure on the lower side.  Blood work showed AKI creatinine 2.0 compared to 1.5 yesterday and 1.0 last week.  K5.2, sodium 133, glucose 153.  Review of Systems: As per HPI otherwise 14 point review of systems negative.    Past Medical History:  Diagnosis Date   AKI (acute kidney injury) ([place]) 01/27/2014   Alcohol abuse    Anasarca    Bilateral leg edema 02/07/2021   Cellulitis    Cellulitis and abscess 01/26/2014   Cellulitis of right foot 04/17/2021    Dental abscess 01/26/2014   Diabetes mellitus type 2 in obese [facility])    Diabetic foot infection ([place]) 04/08/2021   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 2 diabetes mellitus, limited to breakdown of skin ([place])    Facial cellulitis 01/26/2014   HTN (hypertension)    Hypokalemia 01/28/2014   Infected dental carries 01/26/2014   Leukocytosis 01/27/2014   Osteomyelitis ([place]) 04/14/2021   Right foot infection     Past Surgical History:  Procedure Laterality Date   I & D EXTREMITY Right 04/18/2021   Procedure: IRRIGATION AND DEBRIDEMENT OF FOOT;  Surgeon: Newt Minion, MD;  Location: [place];  Service: Orthopedics;  Laterality: Right;   I & D EXTREMITY Right 04/20/2021   Procedure: EXCISIONAL DEBRIDEMENT RIGHT FOOT, APPLICATION OF SKIN GRAFT;  Surgeon: Newt Minion, MD;  Location: [place];  Service: Orthopedics;  Laterality: Right;     reports that he has never smoked. He has never used smokeless tobacco. He reports current alcohol use of about 2.0 standard drinks of alcohol per week. He reports that he does not use drugs.  No Known Allergies  No family history on file.   Prior to Admission medications   Medication Sig Start Date End Date Taking? Authorizing Provider  ciprofloxacin (CIPRO) 500 MG tablet Take 1 tablet (500 mg total) by mouth 2 (two) times daily for 14 days. Patient not taking: Reported on 07/04/2022 07/03/22 07/17/22  Mendel Corning, MD  doxycycline (VIBRA-TABS) 100  MG tablet Take 1 tablet (100 mg total) by mouth 2 (two) times daily for 14 days. Patient not taking: Reported on 07/04/2022 07/03/22 07/17/22  Rai, Ripudeep K, MD  insulin glargine (LANTUS) 100 UNIT/ML Solostar Pen INJECT 10 UNITS INTO THE SKIN DAILY. Patient not taking: Reported on 07/04/2022 07/03/22   Mendel Corning, MD  Insulin Pen Needle 32G X 4 MM MISC Use as directed with insulin pen 07/03/22   Rai, Ripudeep K, MD  Nystatin (GERHARDT'S BUTT CREAM) CREA Apply 1 Application topically 3 (three) times daily.  Apply to inner thighs and buttocks liberally Patient not taking: Reported on 07/04/2022 07/03/22   Rai, Vernelle Emerald, MD  thiamine (VITAMIN B1) 100 MG tablet Take 1 tablet (100 mg total) by mouth daily. Patient not taking: Reported on 07/04/2022 07/04/22   Rai, Vernelle Emerald, MD  glipiZIDE (GLUCOTROL) 5 MG tablet Take 1 tablet (5 mg total) by mouth daily before breakfast. Patient not taking: Reported on 01/14/2016 01/31/14 01/14/16  Delfina Redwood, MD    Physical Exam: Vitals:   07/04/22 1400 07/04/22 1425 07/04/22 1430 07/04/22 1445  BP: 120/74  132/82   Pulse:  80 79 77  Resp: (!) 21 20 (!) 21 20  Temp:      TempSrc:      SpO2:  99% 99% 97%    Constitutional: NAD, calm, comfortable Vitals:   07/04/22 1400 07/04/22 1425 07/04/22 1430 07/04/22 1445  BP: 120/74  132/82   Pulse:  80 79 77  Resp: (!) 21 20 (!) 21 20  Temp:      TempSrc:      SpO2:  99% 99% 97%   Eyes: PERRL, lids and conjunctivae normal ENMT: Mucous membranes are dry. Posterior pharynx clear of any exudate or lesions.Normal dentition.  Neck: normal, supple, no masses, no thyromegaly Respiratory: clear to auscultation bilaterally, no wheezing, no crackles. Normal respiratory effort. No accessory muscle use.  Cardiovascular: Regular rate and rhythm, no murmurs / rubs / gallops. No extremity edema. 2+ pedal pulses. No carotid bruits.  Abdomen: no tenderness, no masses palpated. No hepatosplenomegaly. Bowel sounds positive.  Musculoskeletal: no clubbing / cyanosis. No joint deformity upper and lower extremities. Good ROM, no contractures. Normal muscle tone.  Skin: no rashes, lesions, ulcers. No induration.  Right foot dressing, moving or toes Neurologic: CN 2-12 grossly intact. Sensation intact, DTR normal. Strength 5/5 in all 4.  Psychiatric: Normal judgment and insight. Alert and oriented x 3. Normal mood.   ( Labs on Admission: I have personally reviewed following labs and imaging studies  CBC: Recent Labs  Lab  06/29/22 0108 06/30/22 0234 07/01/22 0209 07/02/22 0615 07/03/22 0223 07/04/22 0909  WBC 16.1* 15.8* 15.7* 12.7* 12.3* 12.8*  NEUTROABS 11.0* 11.4* 11.7* 9.0* 9.2*  --   HGB 11.2* 11.4* 11.1* 10.5* 10.5* 11.0*  HCT 33.6* 34.7* 34.5* 31.5* 31.6* 33.8*  MCV 103.1* 103.3* 104.2* 104.3* 103.9* 105.6*  PLT 244 230 261 265 263 0000000   Basic Metabolic Panel: Recent Labs  Lab 06/29/22 0108 06/30/22 0234 07/01/22 0209 07/02/22 0615 07/03/22 0223 07/04/22 0909  NA 136 136 135 135 133* 133*  K 3.9 4.4 4.3 4.7 4.7 5.2*  CL 112* 111 110 111 111 110  CO2 17* 16* 17* 16* 16* 16*  GLUCOSE 96 110* 100* 119* 129* 153*  BUN 24* 21* 28* 29* 31* 35*  CREATININE 1.22 1.20 1.28* 1.26* 1.51* 2.02*  CALCIUM 8.5* 8.8* 9.0 8.9 8.9 9.3  MG 1.6* 1.8 1.7 1.6*  1.9  --    GFR: Estimated Creatinine Clearance: 49.9 mL/min (A) (by C-G formula based on SCr of 2.02 mg/dL (H)). Liver Function Tests: Recent Labs  Lab 06/29/22 0108 06/30/22 0234 07/01/22 0209 07/02/22 0615 07/03/22 0223  AST 25 23 19 18 20   ALT 24 21 19 17 18   ALKPHOS 131* 122 108 97 90  BILITOT 0.5 0.5 0.9 0.4 0.5  PROT 7.8 8.1 8.0 7.7 7.8  ALBUMIN 1.9* 1.9* 1.9* 1.9* 1.8*   No results for input(s): "LIPASE", "AMYLASE" in the last 168 hours. No results for input(s): "AMMONIA" in the last 168 hours. Coagulation Profile: No results for input(s): "INR", "PROTIME" in the last 168 hours. Cardiac Enzymes: Recent Labs  Lab 07/02/22 0615  CKTOTAL 94   BNP (last 3 results) No results for input(s): "PROBNP" in the last 8760 hours. HbA1C: No results for input(s): "HGBA1C" in the last 72 hours. CBG: Recent Labs  Lab 07/02/22 1621 07/02/22 2008 07/03/22 0815 07/03/22 1152 07/04/22 1053  GLUCAP 139* 166* 136* 125* 140*   Lipid Profile: No results for input(s): "CHOL", "HDL", "LDLCALC", "TRIG", "CHOLHDL", "LDLDIRECT" in the last 72 hours. Thyroid Function Tests: No results for input(s): "TSH", "T4TOTAL", "FREET4", "T3FREE",  "THYROIDAB" in the last 72 hours. Anemia Panel: No results for input(s): "VITAMINB12", "FOLATE", "FERRITIN", "TIBC", "IRON", "RETICCTPCT" in the last 72 hours. Urine analysis:    Component Value Date/Time   COLORURINE YELLOW 07/04/2022 1320   APPEARANCEUR HAZY (A) 07/04/2022 1320   LABSPEC 1.012 07/04/2022 1320   PHURINE 5.0 07/04/2022 1320   GLUCOSEU NEGATIVE 07/04/2022 1320   HGBUR MODERATE (A) 07/04/2022 1320   BILIRUBINUR NEGATIVE 07/04/2022 1320   KETONESUR NEGATIVE 07/04/2022 1320   PROTEINUR 100 (A) 07/04/2022 1320   NITRITE NEGATIVE 07/04/2022 1320   LEUKOCYTESUR TRACE (A) 07/04/2022 1320    Radiological Exams on Admission: 07/06/2022 RENAL  Result Date: 07/04/2022 CLINICAL DATA:  Acute renal injury EXAM: RENAL / URINARY TRACT ULTRASOUND COMPLETE COMPARISON:  Ultrasound 06/23/2022 FINDINGS: Right Kidney: Renal measurements: 14.5 x 7.2 x 7.5 cm = volume: 412 mL. No hydronephrosis. Small amount of perinephric fluid. Left Kidney: Not identified.  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 Bladder: Appears normal for degree of bladder distention. Other: None. IMPRESSION: 1. Compensatory hypertrophy of the RIGHT kidney. Small amount pericholecystic fluid. No hydronephrosis. 2. 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   On: 07/04/2022 15:34   DG Chest Port 1 View  Result Date: 07/04/2022 CLINICAL DATA:  weakness EXAM: PORTABLE CHEST 1 VIEW COMPARISON:  Chest x-ray 06/25/2022. FINDINGS: Similar low lung volumes with elevated right hemidiaphragm. No visible consolidation. No visible pleural effusions or pneumothorax. Cardiomediastinal silhouette is unchanged. IMPRESSION: No evidence of acute cardiopulmonary disease. Electronically Signed   By: 07/06/2022 M.D.   On: 07/04/2022 10:58    EKG: Independently reviewed.  Sinus rhythm, no acute ST changes.  Assessment/Plan Principal Problem:   AKI (acute kidney injury) (HCC)  (please populate well all problems  here in Problem List. (For example, if patient is on BP meds at home and you resume or decide to hold them, it is a problem that needs to be her. Same for CAD, COPD, HLD and so on)  AKI with non-anion gap metabolic acidosis -Clinically appears to be volume contracted with signs of dehydration.  Likely from poor intake.  Probably prerenal, start IV fluid normal saline 150 mL/h x 1 day then reevaluate. -Renal ultrasound, CK and uric acid level. -UA appears to  be abnormal with proteinuria, microscopic hematuria.  Check microalbumin/creatinine level.  Dizziness/lightheadedness -Suspect near syncope secondary to orthostasis hypotension, given the underlying volume contraction. -Hold off home BP meds -Check orthostatic vital signs, continue IV fluid x1 day. -Other DDx, no focal neurodeficit, no nystagmus, also strength symmetrical 5/5 both upper and lower extremities.  We will correct volume status then reevaluate for any neurological symptoms to decide further brain images.  Deconditioning -According to the patient he has not been moving much for the last 2 to 3 weeks, and became very deconditioned.  PT OT evaluation  Right foot deep tissue infection -We will decrease Cipro dose to 250 mg twice daily and continue doxycycline, end date for both antibiotics and is September 6.  Hypoalbuminemia -Appears to be chronic, RUQ ultrasound showed fatty liver.  UA as a new proteinuria and microscopic hematuria.  Check microalbumin/creatinine to rule out nephritis versus nephrotic syndrome.  Hyperkalemia -Secondary to AKI, no significant T wave changes.  Lokelma x1, on IV fluid, recheck K level this evening.  And tomorrow morning.  Hyponatremia -Hypovolemic -Correct volume status then reevaluate.  IDDM -Most recent A1c 8.3, given the worsening of kidney function, hold off long-acting insulin, start sliding scale.  Alcohol abuse -Denies any active drinking for last 3+ weeks.  Check alcohol level and  UDS  DVT prophylaxis: Heparin subcu Code Status: Full code Family Communication: None at bedside Disposition Plan: Expect less than 2 midnight hospital stay Consults called: None Admission status: MedSurg observation with telemetry monitoring   Emeline General MD [facility] Pager [phone]  07/04/2022, 3:45 PM

## 2022-07-04 NOTE — ED Triage Notes (Signed)
Pt presents via EMS with dizziness x 3 weeks.  Feels it is related to his diabetes.  CBG 159, BP 110/72, HR 86, RR 18, 98% on RA

## 2022-07-04 NOTE — ED Provider Notes (Signed)
[facility] EMERGENCY DEPARTMENT Provider Note   CSN: [id number] Arrival date & time: 07/04/22  0263     History  Chief Complaint  Patient presents with   Dizziness    Jesse [name] is a 61 y.o. male.  Pt reports increased weakness and dizziness since yesterday.  Pt was discharged yesterday.  Pt reports dizzy for 3 weeks but now increased weakness.  Pt was discharged yesterday.  Pt reports he thought he could care for himself.  Pt reports unable to get around, unable to eat and weak.   The history is provided by the patient.  Dizziness Quality:  Head spinning and lightheadedness Severity:  Moderate Onset quality:  Gradual Timing:  Constant Progression:  Worsening Chronicity:  New Context: inactivity   Relieved by:  Nothing Worsened by:  Nothing Ineffective treatments:  None tried Associated symptoms: weakness   Associated symptoms: no vomiting   Risk factors: multiple medications   Risk factors: no hx of vertigo        Home Medications Prior to Admission medications   Medication Sig Start Date End Date Taking? Authorizing Provider  ciprofloxacin (CIPRO) 500 MG tablet Take 1 tablet (500 mg total) by mouth 2 (two) times daily for 14 days. Patient not taking: Reported on 07/04/2022 07/03/22 07/17/22  Cathren Harsh, MD  doxycycline (VIBRA-TABS) 100 MG tablet Take 1 tablet (100 mg total) by mouth 2 (two) times daily for 14 days. Patient not taking: Reported on 07/04/2022 07/03/22 07/17/22  Rai, Ripudeep K, MD  insulin glargine (LANTUS) 100 UNIT/ML Solostar Pen INJECT 10 UNITS INTO THE SKIN DAILY. Patient not taking: Reported on 07/04/2022 07/03/22   Cathren Harsh, MD  Insulin Pen Needle 32G X 4 MM MISC Use as directed with insulin pen 07/03/22   Rai, Ripudeep K, MD  Nystatin (GERHARDT'S BUTT CREAM) CREA Apply 1 Application topically 3 (three) times daily. Apply to inner thighs and buttocks liberally Patient not taking: Reported on 07/04/2022 07/03/22   Rai,  Delene Ruffini, MD  thiamine (VITAMIN B1) 100 MG tablet Take 1 tablet (100 mg total) by mouth daily. Patient not taking: Reported on 07/04/2022 07/04/22   Rai, Delene Ruffini, MD  glipiZIDE (GLUCOTROL) 5 MG tablet Take 1 tablet (5 mg total) by mouth daily before breakfast. Patient not taking: Reported on 01/14/2016 01/31/14 01/14/16  Christiane Ha, MD      Allergies    Patient has no known allergies.    Review of Systems   Review of Systems  Gastrointestinal:  Negative for vomiting.  Neurological:  Positive for dizziness and weakness.  All other systems reviewed and are negative.   Physical Exam Updated Vital Signs BP 133/82 (BP Location: Left Arm)   Pulse 80   Temp 98.1 F (36.7 C) (Oral)   Resp (!) 21   SpO2 100%  Physical Exam Vitals and nursing note reviewed.  Constitutional:      General: He is not in acute distress.    Appearance: He is well-developed.  HENT:     Head: Normocephalic and atraumatic.  Eyes:     Conjunctiva/sclera: Conjunctivae normal.  Cardiovascular:     Rate and Rhythm: Normal rate and regular rhythm.     Heart sounds: No murmur heard. Pulmonary:     Effort: Pulmonary effort is normal. No respiratory distress.     Breath sounds: Normal breath sounds.  Abdominal:     Palpations: Abdomen is soft.     Tenderness: There is no abdominal tenderness.  Musculoskeletal:     Cervical back: Neck supple.  Skin:    General: Skin is warm and dry.     Capillary Refill: Capillary refill takes less than 2 seconds.  Neurological:     Mental Status: He is alert.  Psychiatric:        Mood and Affect: Mood normal.     ED Results / Procedures / Treatments   Labs (all labs ordered are listed, but only abnormal results are displayed) Labs Reviewed  BASIC METABOLIC PANEL - Abnormal; Notable for the following components:      Result Value   Sodium 133 (*)    Potassium 5.2 (*)    CO2 16 (*)    Glucose, Bld 153 (*)    BUN 35 (*)    Creatinine, Ser 2.02 (*)    GFR,  Estimated 37 (*)    All other components within normal limits  CBC - Abnormal; Notable for the following components:   WBC 12.8 (*)    RBC 3.20 (*)    Hemoglobin 11.0 (*)    HCT 33.8 (*)    MCV 105.6 (*)    MCH 34.4 (*)    All other components within normal limits  URINALYSIS, ROUTINE W REFLEX MICROSCOPIC - Abnormal; Notable for the following components:   APPearance HAZY (*)    Hgb urine dipstick MODERATE (*)    Protein, ur 100 (*)    Leukocytes,Ua TRACE (*)    Bacteria, UA RARE (*)    All other components within normal limits  CBG MONITORING, ED - Abnormal; Notable for the following components:   Glucose-Capillary 140 (*)    All other components within normal limits    EKG EKG Interpretation  Date/Time:  Thursday July 04 2022 08:56:20 EDT Ventricular Rate:  92 PR Interval:  140 QRS Duration: 86 QT Interval:  352 QTC Calculation: 435 R Axis:   35 Text Interpretation: Normal sinus rhythm Normal ECG When compared with ECG of 22-Jun-2022 22:28, PREVIOUS ECG IS PRESENT since last tracing no significant change Confirmed by Rolan Bucco ([id number]) on 07/04/2022 9:34:52 AM  Radiology DG Chest Port 1 View  Result Date: 07/04/2022 CLINICAL DATA:  weakness EXAM: PORTABLE CHEST 1 VIEW COMPARISON:  Chest x-ray 06/25/2022. FINDINGS: Similar low lung volumes with elevated right hemidiaphragm. No visible consolidation. No visible pleural effusions or pneumothorax. Cardiomediastinal silhouette is unchanged. IMPRESSION: No evidence of acute cardiopulmonary disease. Electronically Signed   By: Feliberto Harts M.D.   On: 07/04/2022 10:58    Procedures Procedures    Medications Ordered in ED Medications - No data to display  ED Course/ Medical Decision Making/ A&P                           Medical Decision Making Pt discharged from hospital yesterday.  Pt report increased weakness  Amount and/or Complexity of Data Reviewed Independent Historian: parent and EMS    Details: EMS  reports pt complained of weakness External Data Reviewed: notes.    Details: Hospitalist notes reviewed  Labs: ordered. Decision-making details documented in ED Course.    Details: Labs ordered reviewed and interpreted.  Pt has elevated bun, creatine and potassium   Radiology: ordered. Discussion of management or test interpretation with external provider(s): I discussed with Hospitalist who will see  Risk Decision regarding hospitalization.           Final Clinical Impression(s) / ED Diagnoses Final diagnoses:  Dizziness  Weakness  Rx / DC Orders ED Discharge Orders     None      An After Visit Summary was printed and given to the patient.    Elson Areas, PA-C 07/04/22 1526    Rolan Bucco, MD 07/04/22 1537

## 2022-07-04 NOTE — ED Notes (Signed)
Attempted to call sister, Arnold Long, with patient status if update. Left VM. Her number is  [phone]

## 2022-07-04 NOTE — ED Notes (Signed)
ED TO INPATIENT HANDOFF REPORT  ED Nurse Name and Phone #: Caryl Pina RN [phone]   S Name/Age/Gender Jesse [name] 61 y.o. male Room/Bed: 030C/030C  Code Status   Code Status: Full Code  Home/SNF/Other Home Patient oriented to: self, place, time, and situation Is this baseline? Yes   Triage Complete: Triage complete  Chief Complaint AKI (acute kidney injury) ([place]) [N17.9]  Triage Note Pt presents via EMS with dizziness x 3 weeks.  Feels it is related to his diabetes.  CBG 159, BP 110/72, HR 86, RR 18, 98% on RA    Allergies No Known Allergies  Level of Care/Admitting Diagnosis ED Disposition     ED Disposition  Admit   Condition  --   Comment  Hospital Area: [facility] [[id number]]  Level of Care: Med-Surg [16]  May place patient in observation at [facility] or [place] if equivalent level of care is available:: No  Covid Evaluation: Asymptomatic - no recent exposure (last 10 days) testing not required  Diagnosis: AKI (acute kidney injury) [facility]) [id number]  Admitting Physician: Lequita Halt [id number]  Attending Physician: Lequita Halt [id number]          B Medical/Surgery History Past Medical History:  Diagnosis Date   AKI (acute kidney injury) ([place]) 01/27/2014   Alcohol abuse    Anasarca    Bilateral leg edema 02/07/2021   Cellulitis    Cellulitis and abscess 01/26/2014   Cellulitis of right foot 04/17/2021   Dental abscess 01/26/2014   Diabetes mellitus type 2 in obese [facility])    Diabetic foot infection ([place]) 04/08/2021   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 2 diabetes mellitus, limited to breakdown of skin ([place])    Facial cellulitis 01/26/2014   HTN (hypertension)    Hypokalemia 01/28/2014   Infected dental carries 01/26/2014   Leukocytosis 01/27/2014   Osteomyelitis ([place]) 04/14/2021   Right foot infection    Past Surgical History:  Procedure Laterality Date   I & D EXTREMITY Right 04/18/2021   Procedure: IRRIGATION  AND DEBRIDEMENT OF FOOT;  Surgeon: Newt Minion, MD;  Location: [place];  Service: Orthopedics;  Laterality: Right;   I & D EXTREMITY Right 04/20/2021   Procedure: EXCISIONAL DEBRIDEMENT RIGHT FOOT, APPLICATION OF SKIN GRAFT;  Surgeon: Newt Minion, MD;  Location: [place];  Service: Orthopedics;  Laterality: Right;     A IV Location/Drains/Wounds Patient Lines/Drains/Airways Status     Active Line/Drains/Airways     Name Placement date Placement time Site Days   Peripheral IV 07/01/22 18 G Left;Posterior Forearm 07/01/22  1015  Forearm  3   Peripheral IV 07/04/22 20 G Anterior;Right Forearm 07/04/22  0934  Forearm  less than 1   Negative Pressure Wound Therapy Heel Right 04/20/21  1552  --  440   Incision (Closed) 04/18/21 Foot Right 04/18/21  1436  -- 442   Incision (Closed) 04/20/21 Foot Right 04/20/21  1602  -- 440   Wound / Incision (Open or Dehisced) 04/14/21 Diabetic ulcer Foot Right 04/14/21  --  Foot  446   Wound / Incision (Open or Dehisced) 06/23/22 (IAD) Incontinence Associated Dermatitis Buttocks Left 06/23/22  0413  Buttocks  11   Wound / Incision (Open or Dehisced) 06/23/22 Toe (Comment  which one) Anterior;Left 06/23/22  --  Toe (Comment  which one)  11   Wound / Incision (Open or Dehisced) 06/23/22 Venous stasis ulcer Heel Left black 06/23/22  1900  Heel  11   Wound / Incision (Open or Dehisced) 06/26/22 Skin tear Pretibial Right red pink 06/26/22  1500  Pretibial  8            Intake/Output Last 24 hours No intake or output data in the 24 hours ending 07/04/22 1621  Labs/Imaging Results for orders placed or performed during the hospital encounter of 07/04/22 (from the past 48 hour(s))  Basic metabolic panel     Status: Abnormal   Collection Time: 07/04/22  9:09 AM  Result Value Ref Range   Sodium 133 (L) 135 - 145 mmol/L   Potassium 5.2 (H) 3.5 - 5.1 mmol/L   Chloride 110 98 - 111 mmol/L   CO2 16 (L) 22 - 32 mmol/L   Glucose, Bld 153 (H) 70 - 99 mg/dL     Comment: Glucose reference range applies only to samples taken after fasting for at least 8 hours.   BUN 35 (H) 6 - 20 mg/dL   Creatinine, Ser 8.24 (H) 0.61 - 1.24 mg/dL   Calcium 9.3 8.9 - 23.5 mg/dL   GFR, Estimated 37 (L) >60 mL/min    Comment: (NOTE) Calculated using the CKD-EPI Creatinine Equation (2021)    Anion gap 7 5 - 15    Comment: Performed at [facility], [address], [place], [place] [address]  CBC     Status: Abnormal   Collection Time: 07/04/22  9:09 AM  Result Value Ref Range   WBC 12.8 (H) 4.0 - 10.5 K/uL   RBC 3.20 (L) 4.22 - 5.81 MIL/uL   Hemoglobin 11.0 (L) 13.0 - 17.0 g/dL   HCT 31.5 (L) 40.0 - 86.7 %   MCV 105.6 (H) 80.0 - 100.0 fL   MCH 34.4 (H) 26.0 - 34.0 pg   MCHC 32.5 30.0 - 36.0 g/dL   RDW 61.9 50.9 - 32.6 %   Platelets 333 150 - 400 K/uL   nRBC 0.0 0.0 - 0.2 %    Comment: Performed at [facility], [address] [place], [place] [address]  CBG monitoring, ED     Status: Abnormal   Collection Time: 07/04/22 10:53 AM  Result Value Ref Range   Glucose-Capillary 140 (H) 70 - 99 mg/dL    Comment: Glucose reference range applies only to samples taken after fasting for at least 8 hours.   Comment 1 Notify RN    Comment 2 Document in Chart   Urinalysis, Routine w reflex microscopic Urine, Clean Catch     Status: Abnormal   Collection Time: 07/04/22  1:20 PM  Result Value Ref Range   Color, Urine YELLOW YELLOW   APPearance HAZY (A) CLEAR   Specific Gravity, Urine 1.012 1.005 - 1.030   pH 5.0 5.0 - 8.0   Glucose, UA NEGATIVE NEGATIVE mg/dL   Hgb urine dipstick MODERATE (A) NEGATIVE   Bilirubin Urine NEGATIVE NEGATIVE   Ketones, ur NEGATIVE NEGATIVE mg/dL   Protein, ur 809 (A) NEGATIVE mg/dL   Nitrite NEGATIVE NEGATIVE   Leukocytes,Ua TRACE (A) NEGATIVE   RBC / HPF 0-5 0 - 5 RBC/hpf   WBC, UA 11-20 0 - 5 WBC/hpf   Bacteria, UA RARE (A) NONE SEEN   Squamous Epithelial / LPF 0-5 0 - 5   Mucus PRESENT     Comment: Performed at [facility], [address] [place], [place] [address]   US RENAL  Result Date: 07/04/2022 CLINICAL DATA:  Acute renal injury EXAM: RENAL /  URINARY TRACT ULTRASOUND COMPLETE COMPARISON:  Ultrasound 06/23/2022 FINDINGS: Right Kidney: Renal measurements: 14.5 x 7.2 x 7.5 cm = volume: 412 mL. No hydronephrosis. Small amount of perinephric fluid. Left Kidney: Not identified.  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 Bladder: Appears normal for degree of bladder distention. Other: None. IMPRESSION: 1. Compensatory hypertrophy of the RIGHT kidney. Small amount pericholecystic fluid. No hydronephrosis. 2. 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   On: 07/04/2022 15:34   DG Chest Port 1 View  Result Date: 07/04/2022 CLINICAL DATA:  weakness EXAM: PORTABLE CHEST 1 VIEW COMPARISON:  Chest x-ray 06/25/2022. FINDINGS: Similar low lung volumes with elevated right hemidiaphragm. No visible consolidation. No visible pleural effusions or pneumothorax. Cardiomediastinal silhouette is unchanged. IMPRESSION: No evidence of acute cardiopulmonary disease. Electronically Signed   By: Margaretha Sheffield M.D.   On: 07/04/2022 10:58    Pending Labs Unresulted Labs (From admission, onward)     Start     Ordered   07/05/22 [id number]  Basic metabolic panel  Tomorrow morning,   R        07/04/22 1545   07/05/22 0500  CBC  Tomorrow morning,   R        07/04/22 1551   07/04/22 [id number]  Basic metabolic panel  Once-Timed,   TIMED        07/04/22 1544   07/04/22 1554  Microalbumin / creatinine urine ratio  Once,   R        07/04/22 1553   07/04/22 1549  Ethanol  Once,   R        07/04/22 1548   07/04/22 1549  Rapid urine drug screen (hospital performed)  ONCE - STAT,   STAT        07/04/22 1550   07/04/22 1544  Lactic acid, plasma  STAT Now then every 3 hours,   R (with STAT occurrences)      07/04/22 1543   07/04/22 1544  Blood gas, venous  Once,   R        07/04/22 1543   07/04/22 1504  CK   Add-on,   AD        07/04/22 1503   07/04/22 1504  Uric acid  Add-on,   AD        07/04/22 1503            Vitals/Pain Today's Vitals   07/04/22 1425 07/04/22 1430 07/04/22 1445 07/04/22 1448  BP:  132/82    Pulse: 80 79 77   Resp: 20 (!) 21 20   Temp:      TempSrc:      SpO2: 99% 99% 97%   PainSc:    Asleep    Isolation Precautions No active isolations  Medications Medications  ciprofloxacin (CIPRO) tablet 250 mg (has no administration in time range)  doxycycline (VIBRA-TABS) tablet 100 mg (has no administration in time range)  heparin injection 5,000 Units (has no administration in time range)  sodium zirconium cyclosilicate (LOKELMA) packet 10 g (has no administration in time range)  0.9 %  sodium chloride infusion (has no administration in time range)  acetaminophen (TYLENOL) tablet 650 mg (has no administration in time range)    Or  acetaminophen (TYLENOL) suppository 650 mg (has no administration in time range)  oxyCODONE (Oxy IR/ROXICODONE) immediate release tablet 5 mg (has no administration in time range)  bisacodyl (DULCOLAX) EC tablet 5 mg (has no administration in time range)  senna-docusate (Senokot-S) tablet  1 tablet (has no administration in time range)  ondansetron (ZOFRAN) tablet 4 mg (has no administration in time range)    Or  ondansetron (ZOFRAN) injection 4 mg (has no administration in time range)  insulin aspart (novoLOG) injection 0-9 Units (has no administration in time range)  sodium bicarbonate tablet 650 mg (has no administration in time range)  thiamine (VITAMIN B1) tablet 100 mg (has no administration in time range)  LORazepam (ATIVAN) tablet 1-4 mg (has no administration in time range)    Or  LORazepam (ATIVAN) injection 1-4 mg (has no administration in time range)  thiamine (VITAMIN B1) tablet 100 mg (has no administration in time range)    Or  thiamine (VITAMIN B1) injection 100 mg (has no administration in time range)  folic acid  (FOLVITE) tablet 1 mg (has no administration in time range)  multivitamin with minerals tablet 1 tablet (has no administration in time range)    Mobility non-ambulatory High fall risk   Focused Assessments Renal Assessment Handoff:    R Recommendations: See Admitting Provider Note  Report given to:   Additional Notes:

## 2022-07-04 NOTE — ED Notes (Signed)
Pt aware that a urine sample is needed, but is unable to provide at this time; urinal at bedside

## 2022-07-05 DIAGNOSIS — N1831 Chronic kidney disease, stage 3a: Secondary | ICD-10-CM | POA: Diagnosis present

## 2022-07-05 DIAGNOSIS — E1165 Type 2 diabetes mellitus with hyperglycemia: Secondary | ICD-10-CM | POA: Diagnosis present

## 2022-07-05 DIAGNOSIS — E872 Acidosis, unspecified: Secondary | ICD-10-CM | POA: Diagnosis present

## 2022-07-05 DIAGNOSIS — F1010 Alcohol abuse, uncomplicated: Secondary | ICD-10-CM | POA: Diagnosis present

## 2022-07-05 DIAGNOSIS — E1122 Type 2 diabetes mellitus with diabetic chronic kidney disease: Secondary | ICD-10-CM | POA: Diagnosis present

## 2022-07-05 DIAGNOSIS — R531 Weakness: Secondary | ICD-10-CM | POA: Diagnosis not present

## 2022-07-05 DIAGNOSIS — E8809 Other disorders of plasma-protein metabolism, not elsewhere classified: Secondary | ICD-10-CM | POA: Diagnosis present

## 2022-07-05 DIAGNOSIS — I951 Orthostatic hypotension: Secondary | ICD-10-CM | POA: Diagnosis present

## 2022-07-05 DIAGNOSIS — R3129 Other microscopic hematuria: Secondary | ICD-10-CM | POA: Diagnosis present

## 2022-07-05 DIAGNOSIS — E871 Hypo-osmolality and hyponatremia: Secondary | ICD-10-CM | POA: Diagnosis present

## 2022-07-05 DIAGNOSIS — E861 Hypovolemia: Secondary | ICD-10-CM | POA: Diagnosis present

## 2022-07-05 DIAGNOSIS — N179 Acute kidney failure, unspecified: Secondary | ICD-10-CM | POA: Diagnosis not present

## 2022-07-05 DIAGNOSIS — I129 Hypertensive chronic kidney disease with stage 1 through stage 4 chronic kidney disease, or unspecified chronic kidney disease: Secondary | ICD-10-CM | POA: Diagnosis present

## 2022-07-05 DIAGNOSIS — E1140 Type 2 diabetes mellitus with diabetic neuropathy, unspecified: Secondary | ICD-10-CM | POA: Diagnosis present

## 2022-07-05 DIAGNOSIS — Z794 Long term (current) use of insulin: Secondary | ICD-10-CM | POA: Diagnosis not present

## 2022-07-05 DIAGNOSIS — R42 Dizziness and giddiness: Secondary | ICD-10-CM | POA: Diagnosis not present

## 2022-07-05 DIAGNOSIS — R809 Proteinuria, unspecified: Secondary | ICD-10-CM | POA: Diagnosis present

## 2022-07-05 DIAGNOSIS — E11628 Type 2 diabetes mellitus with other skin complications: Secondary | ICD-10-CM | POA: Diagnosis present

## 2022-07-05 DIAGNOSIS — E875 Hyperkalemia: Secondary | ICD-10-CM | POA: Diagnosis present

## 2022-07-05 LAB — LACTIC ACID, PLASMA: Lactic Acid, Venous: 0.6 mmol/L (ref 0.5–1.9)

## 2022-07-05 LAB — GLUCOSE, CAPILLARY
Glucose-Capillary: 97 mg/dL (ref 70–99)
Glucose-Capillary: 134 mg/dL — ABNORMAL HIGH (ref 70–99)
Glucose-Capillary: 119 mg/dL — ABNORMAL HIGH (ref 70–99)
Glucose-Capillary: 130 mg/dL — ABNORMAL HIGH (ref 70–99)

## 2022-07-05 LAB — CBC
HCT: 29.6 % — ABNORMAL LOW (ref 39.0–52.0)
Hemoglobin: 9.9 g/dL — ABNORMAL LOW (ref 13.0–17.0)
MCH: 34.3 pg — ABNORMAL HIGH (ref 26.0–34.0)
MCHC: 33.4 g/dL (ref 30.0–36.0)
MCV: 102.4 fL — ABNORMAL HIGH (ref 80.0–100.0)
Platelets: 287 10*3/uL (ref 150–400)
RBC: 2.89 MIL/uL — ABNORMAL LOW (ref 4.22–5.81)
RDW: 13.2 % (ref 11.5–15.5)
WBC: 8.4 10*3/uL (ref 4.0–10.5)
nRBC: 0 % (ref 0.0–0.2)

## 2022-07-05 LAB — BASIC METABOLIC PANEL
Anion gap: 2 — ABNORMAL LOW (ref 5–15)
BUN: 34 mg/dL — ABNORMAL HIGH (ref 6–20)
CO2: 17 mmol/L — ABNORMAL LOW (ref 22–32)
Calcium: 8.7 mg/dL — ABNORMAL LOW (ref 8.9–10.3)
Chloride: 114 mmol/L — ABNORMAL HIGH (ref 98–111)
Creatinine, Ser: 1.42 mg/dL — ABNORMAL HIGH (ref 0.61–1.24)
GFR, Estimated: 57 mL/min — ABNORMAL LOW (ref >60)
Glucose, Bld: 107 mg/dL — ABNORMAL HIGH (ref 70–99)
Potassium: 4.8 mmol/L (ref 3.5–5.1)
Sodium: 133 mmol/L — ABNORMAL LOW (ref 135–145)

## 2022-07-05 MED ORDER — CIPROFLOXACIN HCL 500 MG PO TABS
500.0000 mg | ORAL_TABLET | Freq: Two times a day (BID) | ORAL | Status: DC
  Administered 2022-07-05 – 2022-07-10 (×11): 500 mg via ORAL
  Filled 2022-07-05 (×12): qty 1

## 2022-07-05 MED ORDER — SODIUM CHLORIDE 0.9 % IV SOLN: INTRAVENOUS | Status: DC

## 2022-07-05 MED ORDER — ORAL CARE MOUTH RINSE: 15.0000 mL | OROMUCOSAL | Status: DC | PRN

## 2022-07-05 MED ORDER — LACTATED RINGERS IV SOLN: INTRAVENOUS | Status: DC

## 2022-07-05 NOTE — Evaluation (Signed)
Physical Therapy Evaluation Patient Details Name: Jesse [name] MRN: [id number] DOB: Apr 18, 1961 Today's Date: 07/05/2022  History of Present Illness  61 y/o male presented to ED via EMS called by neighbor after welfare check one day after discharge from hospital with c/o of dizziness and weakness. Found to have decreased BP and elevated creatinine. Admitted for observation and treatment of AKI PMH:  IDDM, recent right foot diabetic foot infection on chronic antibiotics, CKD stage IIIa, HTN, alcohol abuse  Clinical Impression  Pt was discharge home alone after 11 day hospitalization for sepsis due to R foot ulcer. Pt reports with return home he experienced dizziness and weakness which limited his ability to get up from his bed to even get his phone to call for help. While hospitalized pt was ambulating household distances with supervision. Currently pt is limited by orthostatic hypotension in presence of some confusion, decreased awareness of safety and DME use, and generalized weakness and instability. When PT recommended someone stay with him when he first discharges, at which point pt becomes very tearful and reports he does not know if that is possible. Pt is currently mod I for bed mobility, min guard for transfers and min A for ambulation of 15 feet with RW. PT recommending 24 hour supervision at least initially, as well as HHPT. PT will continue to follow acutely.  Orthostatic BPs                                                      BP                      HR (bpm) Supine 135/79 84  Sitting 145/88 86  Standing 112/72 98  Standing after 3 min 116/75 102        Recommendations for follow up therapy are one component of a multi-disciplinary discharge planning process, led by the attending physician.  Recommendations may be updated based on patient status, additional functional criteria and insurance authorization.  Follow Up Recommendations Home health PT      Assistance Recommended at  Discharge Frequent or constant Supervision/Assistance  Patient can return home with the following  A little help with walking and/or transfers;A little help with bathing/dressing/bathroom;Assistance with cooking/housework;Direct supervision/assist for medications management;Direct supervision/assist for financial management;Assist for transportation    Equipment Recommendations None recommended by PT     Functional Status Assessment Patient has had a recent decline in their functional status and demonstrates the ability to make significant improvements in function in a reasonable and predictable amount of time.     Precautions / Restrictions Precautions Precautions: Fall Precaution Comments: B LE wounds Restrictions Weight Bearing Restrictions: No      Mobility  Bed Mobility Overal bed mobility: Modified Independent             General bed mobility comments: increased time.    Transfers Overall transfer level: Needs assistance Equipment used: Rolling walker (2 wheels) Transfers: Sit to/from Stand, Bed to chair/wheelchair/BSC Sit to Stand: Min guard           General transfer comment: min guard for safety, given dizziness. vc for hand placement for power up, pt seems to be confused with proper use of RW    Ambulation/Gait Ambulation/Gait assistance: Min assist Gait Distance (Feet): 15 Feet Assistive device: Rolling walker (2  wheels) Gait Pattern/deviations: Decreased step length - left, Decreased step length - right, Antalgic, Decreased stance time - right, Step-to pattern, Trunk flexed Gait velocity: decreased Gait velocity interpretation: <1.31 ft/sec, indicative of household ambulator   General Gait Details: light min A for steadying with ambulation around foot of bed to recliner, did not progress ambulation further due to continued dizziness        Balance Overall balance assessment: Needs assistance Sitting-balance support: No upper extremity supported,  Feet supported Sitting balance-Leahy Scale: Good     Standing balance support: Bilateral upper extremity supported, Reliant on assistive device for balance Standing balance-Leahy Scale: Poor Standing balance comment: benefit from BUE support for steadying                             Pertinent Vitals/Pain Pain Assessment Pain Assessment: 0-10 Pain Score: 7  Faces Pain Scale: No hurt Pain Location: R LE Pain Descriptors / Indicators: Aching, Grimacing Pain Intervention(s): Limited activity within patient's tolerance, Monitored during session, Repositioned    Home Living Family/patient expects to be discharged to:: Private residence Living Arrangements: Alone Available Help at Discharge: Family;Available PRN/intermittently Type of Home: House Home Access: Level entry Entrance Stairs-Rails: None Entrance Stairs-Number of Steps: 2   Home Layout: One level Home Equipment: None      Prior Function Prior Level of Function : Independent/Modified Independent             Mobility Comments: use of RW since prior day hospitalization 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   Dominant Hand: Right    Extremity/Trunk Assessment   Upper Extremity Assessment Upper Extremity Assessment: Overall WFL for tasks assessed    Lower Extremity Assessment Lower Extremity Assessment: RLE deficits/detail;LLE deficits/detail RLE Deficits / Details: R LE wrapped in UNNA boot after I&D of R charcot foot wound during prior hospitalization, limits ankle ROM, knee also limited in full AROM, strength grossly 3+/5 RLE Sensation: decreased light touch LLE Deficits / Details: ROM WFL, Strength grossly 4/5 LLE Sensation: decreased light touch    Cervical / Trunk Assessment Cervical / Trunk Assessment: Normal  Communication   Communication: No difficulties  Cognition Arousal/Alertness: Awake/alert Behavior During Therapy: Flat affect Overall Cognitive Status:  Impaired/Different from baseline Area of Impairment: Attention, Memory, Safety/judgement, Awareness, Problem solving, Orientation                 Orientation Level: Disoriented to, Time Current Attention Level: Selective Memory: Decreased short-term memory   Safety/Judgement: Decreased awareness of deficits, Decreased awareness of safety Awareness: Intellectual Problem Solving: Decreased initiation, Difficulty sequencing, Requires verbal cues, Requires tactile cues, Slow processing General Comments: pt noted to have intermittent confusion during prior hospitalization, which continues, becomes tearful when discuss he really needs to have someone present for the first couple days when he gets home        General Comments General comments (skin integrity, edema, etc.): SpO2 on RA >95%O2, orthostatic vitals in Clinical Impression above        Assessment/Plan    PT Assessment Patient needs continued PT services  PT Problem List Decreased strength;Decreased activity tolerance;Decreased balance;Decreased mobility;Decreased cognition;Decreased safety awareness;Decreased knowledge of precautions       PT Treatment Interventions DME instruction;Gait training;Functional mobility training;Therapeutic activities;Therapeutic exercise;Balance training;Patient/family education    PT Goals (Current goals can be found in the Care Plan section)  Acute Rehab PT Goals Patient Stated Goal: get back home  PT Goal Formulation: With patient Time For Goal Achievement: 07/18/22 Potential to Achieve Goals: Fair    Frequency Min 3X/week        AM-PAC PT "6 Clicks" Mobility  Outcome Measure Help needed turning from your back to your side while in a flat bed without using bedrails?: None Help needed moving from lying on your back to sitting on the side of a flat bed without using bedrails?: None Help needed moving to and from a bed to a chair (including a wheelchair)?: A Little Help needed  standing up from a chair using your arms (e.g., wheelchair or bedside chair)?: A Little Help needed to walk in hospital room?: A Little Help needed climbing 3-5 steps with a railing? : A Little 6 Click Score: 20    End of Session Equipment Utilized During Treatment: Gait belt Activity Tolerance: Patient limited by pain (limited by dizziness) Patient left: in chair;with call bell/phone within reach;with chair alarm set Nurse Communication: Mobility status PT Visit Diagnosis: Unsteadiness on feet (R26.81);Muscle weakness (generalized) (M62.81);Difficulty in walking, not elsewhere classified (R26.2)    Time: 4854-6270 PT Time Calculation (min) (ACUTE ONLY): 28 min   Charges:   PT Evaluation $PT Eval Moderate Complexity: 1 Mod PT Treatments $Therapeutic Activity: 8-22 mins        Sehaj Kolden B. Beverely Risen PT, DPT Acute Rehabilitation Services Please use secure chat or  Call Office [phone]   Elon Alas Fleet 07/05/2022, 11:01 AM

## 2022-07-05 NOTE — Progress Notes (Signed)
  Progress Note Patient: Jesse [name] EUM:[id number] DOB: 1961/07/26 DOA: 07/04/2022  DOS: the patient was seen and examined on 07/05/2022  Brief hospital course: 61 year old PMH of IDDM, diabetic foot infection, CKD 3, HTN, alcohol abuse, recently discharged from the hospital on 8/23 present to the hospital on 8/24 with complaints of generalized fatigue and near syncope.  Found to have AKI. Assessment and Plan: AKI with non-anion gap metabolic acidosis -Clinically appears to be volume contracted with signs of dehydration.   Likely from poor intake. Probably prerenal, continue with IV fluids. Unremarkable renal ultrasound, CK and uric acid level.   Dizziness/lightheadedness -Suspect near syncope secondary to orthostasis hypotension, given the underlying volume contraction. -Hold off home BP meds Orthostatic vitals were stable, minor blood pressure drop. No focal deficit.  Deconditioning -According to the patient he has not been moving much for the last 2 to 3 weeks, and became very deconditioned.  PT OT evaluation   Right foot deep tissue infection -We will decrease Cipro dose to 250 mg twice daily and continue doxycycline, end date for both antibiotics and is September 6. Outpatient follow-up with Dr. Lajoyce Corners in 1 week.   Hypoalbuminemia -Appears to be chronic, RUQ ultrasound showed fatty liver.  UA as a new proteinuria and microscopic hematuria.  Check microalbumin/creatinine to rule out nephritis versus nephrotic syndrome.   Hyperkalemia -Secondary to AKI, no significant T wave changes.   Treated with Lokelma.   Hyponatremia -Hypovolemic -Correct volume status then reevaluate.   IDDM -Most recent A1c 8.3, given the worsening of kidney function, hold off long-acting insulin, start sliding scale.   Alcohol abuse -Denies any active drinking for last 3+ weeks.  Check alcohol level and UDS    Subjective: No nausea no vomiting no fever no chills.  Had an episode of vomiting at  home.  No diarrhea reported.  Reports jerking motions of his legs.  Physical Exam: Vitals:   07/05/22 0056 07/05/22 0334 07/05/22 0948 07/05/22 1631  BP: 125/78 113/71 135/79 (!) 153/85  Pulse: 80 76 84 85  Resp: 16 16 18 17   Temp: 98.3 F (36.8 C) 98.6 F (37 C) 98.4 F (36.9 C) 98 F (36.7 C)  TempSrc: Oral Oral    SpO2: 95% 99% 100% 100%   General: Appear in mild distress; no visible Abnormal Neck Mass Or lumps, Conjunctiva normal Cardiovascular: S1 and S2 Present, no Murmur, Respiratory: good respiratory effort, Bilateral Air entry present and CTA, no Crackles, no wheezes Abdomen: Bowel Sound present, Non tender  Extremities: bilateral  Pedal edema Neurology: alert and oriented to time, place, and person  Gait not checked due to patient safety concerns   Data Reviewed: I have Reviewed nursing notes, Vitals, and Lab results since pt's last encounter. Pertinent lab results CBC and BMP I have ordered test including CBC and BMP    Family Communication: No one at bedside  Disposition: Status is: Inpatient Remains inpatient appropriate because: Need for IV fluids for AKI.  Author: , MD 07/05/2022 6:26 PM  Please look on [email or web address] to find out who is on call.

## 2022-07-05 NOTE — Progress Notes (Signed)
ID brief Note   [facility] called by ID pharmacist yesterday with no updates for GVR 8/10. Call back number left in case of any ID noted No new recommendations otherwise.  ID will sign off.   Please call with questions.   Odette Fraction, MD Infectious Disease Physician [facility] [address] [place], [place] [address] Phone: [phone]  Fax: [phone]

## 2022-07-05 NOTE — Hospital Course (Signed)
61 year old PMH of IDDM, diabetic foot infection, CKD 3, HTN, alcohol abuse, recently discharged from the hospital on 8/23 present to the hospital on 8/24 with complaints of generalized fatigue and near syncope.  Found to have AKI.

## 2022-07-05 NOTE — TOC Initial Note (Addendum)
Transition of Care [facility]) - Initial/Assessment Note    Patient Details  Name: Jesse [name] MRN: [id number] Date of Birth: 1961/09/26  [facility] CM/SW Contact:    Tom-Johnson, Hershal Coria, RN Phone Number: 07/05/2022, 3:09 PM  Clinical Narrative:                  CM spoke with patient at bedside about needs for post hospital transition. Admitted for AKI. Patient was recently discharged form the hospital.  From home alone. Does not have children and parents are deceased. Has five siblings and states they live out of town. States he is not currently employed and not on disability. States his sister, [name] filled for Medicaid on his last admission. Gave CM permission to speak with [name].  CM called and spoke with [name] [phone]). [name] states the Medicaid application was filled out at the hospital and has not gotten an update yet.  CM called and spoke with [facility] with [employer] [phone]) and she states patient's application is pending.  CM called and spoke with Dell Ponto at [facility] office [phone]). Dell Ponto states patient's application is still pending and they have not yet assigned a Case worker to him yet. States it usually takes weeks to months for them to be approved. CM notified patient.   Patient does not have a PCP. Hospital f/u and wound care appointments were scheduled at his last hospitalization and info on AVS.  Home health PT/OT recommended. CM contacted Tresa Endo [phone]) with [facility] as they are the agency doing charity this week. Tresa Endo states she will have to send referral to main office, CM waits their response.  CM will continue to follow with needs as patient progresses with care.   17:20- Home health referral declined by [facility]. CM called Frances [facility]urbish and [place] voiced acceptance as Charity. Info on AVS. CM will continue to follow with needs as patient progresses with care.   Expected Discharge Plan: Home w Home Health Services Barriers to  Discharge: Continued Medical Work up   Patient Goals and CMS Choice Patient states their goals for this hospitalization and ongoing recovery are:: To return home CMS Medicare.gov Compare Post Acute Care list provided to:: Patient Choice offered to / list presented to : Patient, Sibling (Sister, Georgios Kina.)  Expected Discharge Plan and Services Expected Discharge Plan: Home w Home Health Services   Discharge Planning Services: CM Consult Post Acute Care Choice: Home Health Living arrangements for the past 2 months: Apartment                 DME Arranged: N/A DME Agency: NA       HH Arranged: PT, OT, RN, Social Work [employer] Agency: [facility] Date HH Agency Contacted: 07/05/22 Time HH Agency Contacted: 1505 Representative spoke with at [facility] Agency: Tresa Endo- Awaiting response for [employer].  Prior Living Arrangements/Services Living arrangements for the past 2 months: Apartment Lives with:: Self Patient language and need for interpreter reviewed:: Yes Do you feel safe going back to the place where you live?: Yes      Need for Family Participation in Patient Care: Yes (Comment) Care giver support system in place?: Yes (comment) Current home services: DME (W/c, walker) Criminal Activity/Legal Involvement Pertinent to Current Situation/Hospitalization: No - Comment as needed  Activities of Daily Living Home Assistive Devices/Equipment: None ADL Screening (condition at time of admission) Patient's cognitive ability adequate to safely complete daily activities?: Yes Is the patient deaf or have difficulty hearing?: No Does the  patient have difficulty seeing, even when wearing glasses/contacts?: No Does the patient have difficulty concentrating, remembering, or making decisions?: Yes Patient able to express need for assistance with ADLs?: Yes Does the patient have difficulty dressing or bathing?: Yes Independently performs ADLs?: No Does the patient have difficulty walking or  climbing stairs?: Yes Weakness of Legs: Both Weakness of Arms/Hands: None  Permission Sought/Granted Permission sought to share information with : Case Manager, Family Supports Permission granted to share information with : Yes, Verbal Permission Granted              Emotional Assessment Appearance:: Appears stated age Attitude/Demeanor/Rapport: Engaged, Gracious Affect (typically observed): Accepting, Appropriate, Calm, Hopeful, Pleasant Orientation: : Oriented to Self, Oriented to Place, Oriented to  Time, Oriented to Situation Alcohol / Substance Use: Not Applicable Psych Involvement: No (comment)  Admission diagnosis:  Dizziness [R42] Weakness [R53.1] AKI (acute kidney injury) (HCC) [N17.9] Patient Active Problem List   Diagnosis Date Noted   Charcot's arthropathy 06/28/2022   Leukocytosis    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)    Severe protein-calorie malnutrition (HCC)    Diabetic foot infection (HCC) 06/22/2022   Sepsis (HCC) 06/22/2022   Cellulitis in diabetic foot (HCC) 04/17/2021   Osteomyelitis (HCC) 04/14/2021   HTN (hypertension) 04/08/2021   HLD (hyperlipidemia) 04/08/2021   Alcohol abuse    DM (diabetes mellitus) (HCC) 01/27/2014   AKI (acute kidney injury) (HCC) 01/27/2014   PCP:  Pcp, No Pharmacy:   [facility] [phone] - [place], [place] - [address] AT [facility] [address]  [place] [address] Phone: [phone] Fax: [phone]     Social Determinants of Health (SDOH) Interventions    Readmission Risk Interventions     No data to display

## 2022-07-06 LAB — GLUCOSE, CAPILLARY
Glucose-Capillary: 116 mg/dL — ABNORMAL HIGH (ref 70–99)
Glucose-Capillary: 156 mg/dL — ABNORMAL HIGH (ref 70–99)
Glucose-Capillary: 113 mg/dL — ABNORMAL HIGH (ref 70–99)
Glucose-Capillary: 118 mg/dL — ABNORMAL HIGH (ref 70–99)

## 2022-07-06 LAB — CBC
HCT: 29.8 % — ABNORMAL LOW (ref 39.0–52.0)
Hemoglobin: 10.2 g/dL — ABNORMAL LOW (ref 13.0–17.0)
MCH: 34.3 pg — ABNORMAL HIGH (ref 26.0–34.0)
MCHC: 34.2 g/dL (ref 30.0–36.0)
MCV: 100.3 fL — ABNORMAL HIGH (ref 80.0–100.0)
Platelets: 290 10*3/uL (ref 150–400)
RBC: 2.97 MIL/uL — ABNORMAL LOW (ref 4.22–5.81)
RDW: 13 % (ref 11.5–15.5)
WBC: 11 10*3/uL — ABNORMAL HIGH (ref 4.0–10.5)
nRBC: 0 % (ref 0.0–0.2)

## 2022-07-06 LAB — BASIC METABOLIC PANEL
Anion gap: 6 (ref 5–15)
BUN: 28 mg/dL — ABNORMAL HIGH (ref 6–20)
CO2: 18 mmol/L — ABNORMAL LOW (ref 22–32)
Calcium: 9 mg/dL (ref 8.9–10.3)
Chloride: 110 mmol/L (ref 98–111)
Creatinine, Ser: 1.25 mg/dL — ABNORMAL HIGH (ref 0.61–1.24)
GFR, Estimated: >60 mL/min (ref >60)
Glucose, Bld: 109 mg/dL — ABNORMAL HIGH (ref 70–99)
Potassium: 4.8 mmol/L (ref 3.5–5.1)
Sodium: 134 mmol/L — ABNORMAL LOW (ref 135–145)

## 2022-07-06 LAB — MAGNESIUM: Magnesium: 1.2 mg/dL — ABNORMAL LOW (ref 1.7–2.4)

## 2022-07-06 MED ORDER — MAGNESIUM SULFATE 2 GM/50ML IV SOLN
2.0000 g | Freq: Once | INTRAVENOUS | Status: AC
Start: 2022-07-06 — End: 2022-07-06
  Administered 2022-07-06: 2 g via INTRAVENOUS
  Filled 2022-07-06: qty 50

## 2022-07-06 MED ORDER — SODIUM BICARBONATE 650 MG PO TABS
1300.0000 mg | ORAL_TABLET | Freq: Two times a day (BID) | ORAL | Status: DC
  Administered 2022-07-06 – 2022-07-10 (×8): 1300 mg via ORAL
  Filled 2022-07-06 (×8): qty 2

## 2022-07-06 MED ORDER — SODIUM BICARBONATE 650 MG PO TABS
650.0000 mg | ORAL_TABLET | Freq: Once | ORAL | Status: AC
  Administered 2022-07-06: 650 mg via ORAL
  Filled 2022-07-06: qty 1

## 2022-07-06 NOTE — Progress Notes (Signed)
Mobility Specialist Progress Note   07/06/22 1410  Mobility  Activity Refused mobility   Pt deferring mobility stating that there LLE was in too much pain (8/10) but declining pain meds. Pt just wants to stay off of it. Will f/u later today if time permits.   Frederico Hamman Mobility Specialist MS [facility] #:  [phone] Acute Rehab Office:  [phone]

## 2022-07-06 NOTE — Progress Notes (Signed)
Patient removed telemetry box. Attempted to replace and he refused. Stated,it makes me sick and I don't want it back on me.Call the doctor if you have to, but I am not putting it back no matter what you say. Patient is A/Ox4.

## 2022-07-06 NOTE — Progress Notes (Addendum)
  Progress Note Patient: Jesse [name] JOI:[id number] DOB: December 07, 1960 DOA: 07/04/2022  DOS: the patient was seen and examined on 07/06/2022  Brief hospital course: 61 year old PMH of IDDM, diabetic foot infection, CKD 3, HTN, alcohol abuse, recently discharged from the hospital on 8/23 present to the hospital on 8/24 with complaints of generalized fatigue and near syncope.  Found to have AKI.  Assessment and Plan: AKI with non-anion gap metabolic acidosis Clinically appears to be volume contracted with signs of dehydration.   Most likely prerenal from poor p.o. intake. Treated aggressively with IV fluids. Currently renal function improving and therefore we will stop the fluid and monitor. US renal negative for any acute abnormality or obstruction. On sodium bicarb chronically, will increase the dose.   Dizziness/lightheadedness Near syncope secondary to orthostasis hypotension, given the underlying volume contraction. Hold off home BP meds Orthostatic vitals were stable, minor blood pressure drop. No focal deficit.  Deconditioning According to the patient he has not been moving much for the last 2 to 3 weeks, and became very deconditioned.  PT OT evaluation recommend home with home health.   Right foot deep tissue infection Continue Cipro and Doxy. Renally adjusted. Outpatient follow-up with Dr. Lajoyce Corners in 1 week.   Hypoalbuminemia Likely chronic from poor p.o. intake and alcohol intake. Monitor.   Hyperkalemia Secondary to AKI, no significant T wave changes.   Treated with Lokelma.   Hyponatremia Hypovolemic, currently corrected.   Type 2 diabetes mellitus, uncontrolled with hyperglycemia with diabetic infection with long-acting insulin. Most recent A1c 8.3, given the worsening of kidney function, hold off long-acting insulin, start sliding scale. Sugars still well controlled without Levemir.  Monitor.   Alcohol abuse Denies any active drinking for last 3+  weeks.  Restless left leg. Patient reports that his left leg has been restless and moving on his own.  On examination I do not find any significant findings of clonus or any other acute movement disorder. Monitor for now.    Subjective: Oral intake adequate.  No nausea no vomiting.  Tells me that his leg on the left continues to move on his own.  Physical Exam: Vitals:   07/06/22 0405 07/06/22 0501 07/06/22 0833 07/06/22 1705  BP: 139/77 (!) 146/84 137/84 (!) 145/79  Pulse: 87 86 83 (!) 59  Resp: 16 18 20 18   Temp: 98.7 F (37.1 C) 98.3 F (36.8 C) 98.2 F (36.8 C)   TempSrc: Oral     SpO2: 97% 98% 100% 99%  General: Appear in mild distress; no visible Abnormal Neck Mass Or lumps, Conjunctiva normal Cardiovascular: S1 and S2 Present, aortic systolic Murmur, Respiratory: good respiratory effort, Bilateral Air entry present and CTA, no Crackles, no wheezes Abdomen: Bowel Sound present, Non tender Extremities: bilateral right more than left pedal edema Neurology: alert and oriented to Self, Place and time.  Data Reviewed: I have Reviewed nursing notes, Vitals, and Lab results since pt's last encounter. Pertinent lab results CBC and BMP I have ordered test including CBC and BMP     Family Communication: No one at bedside  Disposition: Status is: Inpatient Remains inpatient appropriate because: Monitor for stabilization and improvement in renal function after stopping IV fluids.  Author: , MD 07/06/2022 7:36 PM  Please look on [email or web address] to find out who is on call.

## 2022-07-06 NOTE — Progress Notes (Signed)
Patient has habit of removing dressing on RLE.Dressing repeated due to removal by patient.Patient also disrobes and took off telemetry. Dr. Martyn Malay is aware and stated it is ok.

## 2022-07-07 LAB — CBC
HCT: 29.8 % — ABNORMAL LOW (ref 39.0–52.0)
Hemoglobin: 10.2 g/dL — ABNORMAL LOW (ref 13.0–17.0)
MCH: 34.3 pg — ABNORMAL HIGH (ref 26.0–34.0)
MCHC: 34.2 g/dL (ref 30.0–36.0)
MCV: 100.3 fL — ABNORMAL HIGH (ref 80.0–100.0)
Platelets: 318 10*3/uL (ref 150–400)
RBC: 2.97 MIL/uL — ABNORMAL LOW (ref 4.22–5.81)
RDW: 12.8 % (ref 11.5–15.5)
WBC: 9.4 10*3/uL (ref 4.0–10.5)
nRBC: 0 % (ref 0.0–0.2)

## 2022-07-07 LAB — BASIC METABOLIC PANEL
Anion gap: 6 (ref 5–15)
BUN: 20 mg/dL (ref 6–20)
CO2: 21 mmol/L — ABNORMAL LOW (ref 22–32)
Calcium: 9.2 mg/dL (ref 8.9–10.3)
Chloride: 109 mmol/L (ref 98–111)
Creatinine, Ser: 1.21 mg/dL (ref 0.61–1.24)
GFR, Estimated: >60 mL/min (ref >60)
Glucose, Bld: 153 mg/dL — ABNORMAL HIGH (ref 70–99)
Potassium: 4.9 mmol/L (ref 3.5–5.1)
Sodium: 136 mmol/L (ref 135–145)

## 2022-07-07 LAB — GLUCOSE, CAPILLARY
Glucose-Capillary: 127 mg/dL — ABNORMAL HIGH (ref 70–99)
Glucose-Capillary: 142 mg/dL — ABNORMAL HIGH (ref 70–99)
Glucose-Capillary: 120 mg/dL — ABNORMAL HIGH (ref 70–99)

## 2022-07-07 LAB — MAGNESIUM: Magnesium: 1.3 mg/dL — ABNORMAL LOW (ref 1.7–2.4)

## 2022-07-07 MED ORDER — GABAPENTIN 100 MG PO CAPS
100.0000 mg | ORAL_CAPSULE | Freq: Three times a day (TID) | ORAL | Status: DC
Start: 2022-07-07 — End: 2022-07-10
  Administered 2022-07-07 – 2022-07-10 (×10): 100 mg via ORAL
  Filled 2022-07-07 (×10): qty 1

## 2022-07-07 MED ORDER — SENNOSIDES-DOCUSATE SODIUM 8.6-50 MG PO TABS
2.0000 | ORAL_TABLET | Freq: Two times a day (BID) | ORAL | Status: DC
  Administered 2022-07-07 – 2022-07-08 (×3): 2 via ORAL
  Filled 2022-07-07 (×7): qty 2

## 2022-07-07 MED ORDER — MAGNESIUM SULFATE 2 GM/50ML IV SOLN
2.0000 g | Freq: Once | INTRAVENOUS | Status: AC
  Administered 2022-07-07: 2 g via INTRAVENOUS
  Filled 2022-07-07: qty 50

## 2022-07-07 NOTE — Progress Notes (Signed)
  Progress Note Patient: Jesse [name] [id number] DOB: [birth year] DOA: 07/04/2022  DOS: the patient was seen and examined on 07/07/2022  Brief hospital course: 61 year old PMH of IDDM, diabetic foot infection, CKD 3, HTN, alcohol abuse, recently discharged from the hospital on 8/23 present to the hospital on 8/24 with complaints of generalized fatigue and near syncope.  Found to have AKI.  Assessment and Plan: AKI with non-anion gap metabolic acidosis Clinically appears to be volume contracted with signs of dehydration.   Most likely prerenal from poor p.o. intake. Treated aggressively with IV fluids. Currently renal function improving and therefore we will stop the fluid and monitor. US renal negative for any acute abnormality or obstruction. On sodium bicarb chronically, metabolic acidosis improved with increased dose of bicarb. Renal function normal now.   Dizziness/lightheadedness Near syncope secondary to orthostasis hypotension, given the underlying volume contraction. Hold off home BP meds Orthostatic vitals were stable, minor blood pressure drop. No focal deficit.  Deconditioning According to the patient he has not been moving much for the last 2 to 3 weeks, and became very deconditioned.  PT OT evaluation recommend home with home health.   Right foot deep tissue infection Continue Cipro and Doxy. Renally adjusted. Outpatient follow-up with Dr. Lajoyce Corners in 1 week.   Hypoalbuminemia Likely chronic from poor p.o. intake and alcohol intake. Monitor.   Hyperkalemia Secondary to AKI, no significant T wave changes.   Treated with Lokelma.  Hypomagnesemia. Replaced. Monitor.   Hyponatremia Hypovolemic, currently corrected.   Type 2 diabetes mellitus, uncontrolled with hyperglycemia with diabetic infection with long-acting insulin. Most recent A1c 8.3, given the worsening of kidney function, hold off long-acting insulin, start sliding scale. Sugars still well controlled  without Levemir.  Monitor.   Alcohol abuse Denies any active drinking for last 3+ weeks.  Restless left leg. 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  On examination I do not find any significant findings of clonus or any other acute movement disorder. Monitor for now. Initiate gabapentin for neuropathy pain    Subjective: Reports burning pain in bilateral foot.  No nausea no vomiting no fever no chills.  Oral intake is minimal.  Physical Exam: Vitals:   07/06/22 1705 07/06/22 2049 07/07/22 0539 07/07/22 1006  BP: (!) 145/79 (!) 147/83 126/76 122/77  Pulse: (!) 59 88 83 86  Resp: 18 18 18 18   Temp:  99.4 F (37.4 C) 98.4 F (36.9 C) 98.2 F (36.8 C)  TempSrc:   Oral Oral  SpO2: 99% 97% 100% 100%  General: Appear in mild distress; no visible Abnormal Neck Mass Or lumps, Conjunctiva normal Cardiovascular: S1 and S2 Present, no Murmur, Respiratory: good respiratory effort, Bilateral Air entry present and CTA, no Crackles, no wheezes Abdomen: Bowel Sound present, Non tender Extremities: Bilateral edema, right leg warmer than left leg compared to yesterday. Neurology: alert and oriented to Self, Place and time.  Data Reviewed: I have Reviewed nursing notes, Vitals, and Lab results since pt's last encounter. Pertinent lab results CBC and BMP I have ordered test including CBC and BMP      Family Communication: No one at bedside  Disposition: Status is: Inpatient Remains inpatient appropriate ready for discharge on 8/28  Author: 9/28, MD 07/07/2022 4:01 PM  Please look on [email or web address] to find out who is on call.

## 2022-07-08 ENCOUNTER — Inpatient Hospital Stay (HOSPITAL_COMMUNITY): Payer: Medicaid Other

## 2022-07-08 LAB — BASIC METABOLIC PANEL
Anion gap: 4 — ABNORMAL LOW (ref 5–15)
BUN: 27 mg/dL — ABNORMAL HIGH (ref 6–20)
CO2: 20 mmol/L — ABNORMAL LOW (ref 22–32)
Calcium: 8.8 mg/dL — ABNORMAL LOW (ref 8.9–10.3)
Chloride: 110 mmol/L (ref 98–111)
Creatinine, Ser: 1.65 mg/dL — ABNORMAL HIGH (ref 0.61–1.24)
GFR, Estimated: 48 mL/min — ABNORMAL LOW (ref >60)
Glucose, Bld: 121 mg/dL — ABNORMAL HIGH (ref 70–99)
Potassium: 4.6 mmol/L (ref 3.5–5.1)
Sodium: 134 mmol/L — ABNORMAL LOW (ref 135–145)

## 2022-07-08 LAB — CBC
HCT: 29.5 % — ABNORMAL LOW (ref 39.0–52.0)
Hemoglobin: 9.9 g/dL — ABNORMAL LOW (ref 13.0–17.0)
MCH: 34.3 pg — ABNORMAL HIGH (ref 26.0–34.0)
MCHC: 33.6 g/dL (ref 30.0–36.0)
MCV: 102.1 fL — ABNORMAL HIGH (ref 80.0–100.0)
Platelets: 300 10*3/uL (ref 150–400)
RBC: 2.89 MIL/uL — ABNORMAL LOW (ref 4.22–5.81)
RDW: 13 % (ref 11.5–15.5)
WBC: 8.7 10*3/uL (ref 4.0–10.5)
nRBC: 0 % (ref 0.0–0.2)

## 2022-07-08 LAB — GLUCOSE, CAPILLARY
Glucose-Capillary: 112 mg/dL — ABNORMAL HIGH (ref 70–99)
Glucose-Capillary: 135 mg/dL — ABNORMAL HIGH (ref 70–99)
Glucose-Capillary: 107 mg/dL — ABNORMAL HIGH (ref 70–99)
Glucose-Capillary: 142 mg/dL — ABNORMAL HIGH (ref 70–99)
Glucose-Capillary: 156 mg/dL — ABNORMAL HIGH (ref 70–99)

## 2022-07-08 LAB — C-REACTIVE PROTEIN: CRP: 14.1 mg/dL — ABNORMAL HIGH (ref <1.0)

## 2022-07-08 LAB — MAGNESIUM: Magnesium: 1.6 mg/dL — ABNORMAL LOW (ref 1.7–2.4)

## 2022-07-08 MED ORDER — LACTATED RINGERS IV SOLN: INTRAVENOUS | Status: DC

## 2022-07-08 MED ORDER — MAGNESIUM SULFATE 2 GM/50ML IV SOLN
2.0000 g | Freq: Once | INTRAVENOUS | Status: AC
  Administered 2022-07-08: 2 g via INTRAVENOUS
  Filled 2022-07-08: qty 50

## 2022-07-08 NOTE — Progress Notes (Signed)
Progress Note Patient: Jesse [name] LOV:[id number] DOB: May 21, 1961 DOA: 07/04/2022  DOS: the patient was seen and examined on 07/08/2022  Brief hospital course: 61 year old PMH of IDDM, diabetic foot infection, CKD 3, HTN, alcohol abuse, recently discharged from the hospital on 8/23 present to the hospital on 8/24 with complaints of generalized fatigue and near syncope.  Found to have AKI.  Assessment and Plan: AKI with non-anion gap metabolic acidosis Clinically appears to be volume contracted with signs of dehydration.   Most likely prerenal from poor p.o. intake. Treated aggressively with IV fluids.  IV fluid was stopped but now renal function worsening again and therefore IV fluid resume again as of 8/28.  Encourage p.o. fluids. Currently renal function improving and therefore we will stop the fluid and monitor. US renal negative for any acute abnormality or obstruction. On sodium bicarb chronically, metabolic acidosis improved with increased dose of bicarb. Renal function normal now.   Dizziness/lightheadedness Near syncope secondary to orthostasis hypotension, given the underlying volume contraction. Hold off home BP meds Orthostatic vitals were stable, minor blood pressure drop. No focal deficit.  Deconditioning According to the patient he has not been moving much for the last 2 to 3 weeks, and became very deconditioned.  PT OT evaluation recommend home with home health.   Right foot deep tissue infection Continue Cipro and Doxy. Renally adjusted. Outpatient follow-up with Dr. Sharol Given in 1 week. X-ray unremarkable.  Will discuss with Dr. Sharol Given in the morning.  Check ESR and CRP.   Hypoalbuminemia Likely chronic from poor p.o. intake and alcohol intake. Monitor.   Hyperkalemia Secondary to AKI, no significant T wave changes.   Treated with Lokelma.  Hypomagnesemia. Replaced. Monitor.   Hyponatremia Hypovolemic, currently corrected.   Type 2 diabetes mellitus,  uncontrolled with hyperglycemia with diabetic infection with long-acting insulin. Most recent A1c 8.3, given the worsening of kidney function, hold off long-acting insulin, start sliding scale. Sugars still well controlled without Levemir.  Monitor.   Alcohol abuse Denies any active drinking for last 3+ weeks.  Restless left leg. 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  On examination I do not find any significant findings of clonus or any other acute movement disorder. Monitor for now. Initiate gabapentin for neuropathy pain    Subjective: No nausea no vomiting no fever no chills.  Continues to report pain in his right leg.  Physical Exam: Vitals:   07/07/22 1630 07/07/22 2219 07/08/22 0944 07/08/22 1708  BP: 131/80 128/83 (!) 86/59 121/81  Pulse: 82 88 88 86  Resp: '20 18 18 16  ' Temp: 98 F (36.7 C) 98.3 F (36.8 C)  98.5 F (36.9 C)  TempSrc:  Oral    SpO2: 100% 99% 100% 95%  General: Appear in mild distress; no visible Abnormal Neck Mass Or lumps, Conjunctiva normal Cardiovascular: S1 and S2 Present, no Murmur, Respiratory: good respiratory effort, Bilateral Air entry present and CTA, no Crackles, no wheezes Abdomen: Bowel Sound present, Non tender Extremities: Bilateral lower extremity edema, right more than left. Neurology: alert and oriented to Self, Place and time.  Data Reviewed: I have Reviewed nursing notes, Vitals, and Lab results since pt's last encounter. Pertinent lab results CBC and BMP I have ordered test including CBC and BMP I have ordered imaging studies x-ray tibia-fibula.      Family Communication: No one at bedside  Disposition: Status is: Inpatient Remains inpatient renal function worsening.  Back on IV fluids.  Author: Berle Mull, MD 07/08/2022 7:56 PM  Please look on [email or web address] to find out who is on call.

## 2022-07-08 NOTE — Progress Notes (Signed)
Physical Therapy Treatment Patient Details Name: Jesse [name] MRN: [id number] DOB: November 29, 1960 Today's Date: 07/08/2022   History of Present Illness 61 y/o male presented to ED via EMS called by neighbor after welfare check one day after discharge from hospital with c/o of dizziness and weakness. Found to have decreased BP and elevated creatinine. Admitted for observation and treatment of AKI PMH:  IDDM, recent right foot diabetic foot infection on chronic antibiotics, CKD stage IIIa, HTN, alcohol abuse    PT Comments    Pt with only minor c/o dizziness with turning during gait, however pt continues to be limited by decreased safety and DME use awareness. Pt with c/o of pain in R LE, initially grunts to move it off bed but then does not seem to have increased pain in R LE with gait. Pt is mod I for bed mobility, min guard for transfers and progresses to min guard for ambulation with RW in hallway. D/c plan remains appropriate. PT will continue to follow acutely.     Recommendations for follow up therapy are one component of a multi-disciplinary discharge planning process, led by the attending physician.  Recommendations may be updated based on patient status, additional functional criteria and insurance authorization.  Follow Up Recommendations  Home health PT     Assistance Recommended at Discharge Frequent or constant Supervision/Assistance  Patient can return home with the following A little help with walking and/or transfers;A little help with bathing/dressing/bathroom;Assistance with cooking/housework;Direct supervision/assist for medications management;Direct supervision/assist for financial management;Assist for transportation   Equipment Recommendations  None recommended by PT       Precautions / Restrictions Precautions Precautions: Fall Precaution Comments: B LE wounds Restrictions Weight Bearing Restrictions: No     Mobility  Bed Mobility Overal bed mobility: Modified  Independent             General bed mobility comments: increased time.    Transfers Overall transfer level: Needs assistance Equipment used: Rolling walker (2 wheels) Transfers: Sit to/from Stand, Bed to chair/wheelchair/BSC Sit to Stand: Min guard, From elevated surface           General transfer comment: min guard for safety, no dizziness, despite cuing prefers to have L LE outside RW for power up, after cues for scooting forward is able to remember proper hand placement for power up    Ambulation/Gait Ambulation/Gait assistance: Min assist, Min guard Gait Distance (Feet): 150 Feet Assistive device: Rolling walker (2 wheels) Gait Pattern/deviations: Decreased step length - left, Decreased step length - right, Antalgic, Decreased stance time - right, Step-to pattern, Trunk flexed Gait velocity: decreased Gait velocity interpretation: <1.8 ft/sec, indicate of risk for recurrent falls   General Gait Details: light min A progressing to min guard for safety, good step over step, however continues to have difficulty pushing RW, prefers to pick it up to move it, with constant cuing able to correct       Balance Overall balance assessment: Needs assistance Sitting-balance support: No upper extremity supported, Feet supported Sitting balance-Leahy Scale: Good     Standing balance support: Bilateral upper extremity supported, Reliant on assistive device for balance Standing balance-Leahy Scale: Fair Standing balance comment: benefit from UE support for steadying with dynamic activities                            Cognition Arousal/Alertness: Awake/alert Behavior During Therapy: Flat affect Overall Cognitive Status: Impaired/Different from baseline Area of Impairment: Attention, Memory,  Safety/judgement, Awareness, Problem solving, Orientation                 Orientation Level: Disoriented to, Time Current Attention Level: Selective Memory: Decreased  short-term memory   Safety/Judgement: Decreased awareness of deficits, Decreased awareness of safety Awareness: Intellectual Problem Solving: Decreased initiation, Difficulty sequencing, Requires verbal cues, Requires tactile cues, Slow processing General Comments: requires maximal encouragement, decreased awareness of need for mobility, responds "OK" to commands and then asks what to do again           General Comments General comments (skin integrity, edema, etc.): VSS on RA      Pertinent Vitals/Pain Pain Assessment Pain Assessment: Faces Faces Pain Scale: Hurts a little bit Pain Location: R LE Pain Descriptors / Indicators: Aching, Grimacing Pain Intervention(s): Limited activity within patient's tolerance, Monitored during session, Repositioned, Premedicated before session    Home Living Family/patient expects to be discharged to:: Private residence Living Arrangements: Alone Available Help at Discharge: Family;Available PRN/intermittently Type of Home: House Home Access: Level entry Entrance Stairs-Rails: None Entrance Stairs-Number of Steps: 2   Home Layout: One level Home Equipment: None          PT Goals (current goals can now be found in the care plan section) Acute Rehab PT Goals Patient Stated Goal: get back home PT Goal Formulation: With patient Time For Goal Achievement: 07/18/22 Potential to Achieve Goals: Fair Progress towards PT goals: Progressing toward goals    Frequency    Min 3X/week      PT Plan Current plan remains appropriate       AM-PAC PT "6 Clicks" Mobility   Outcome Measure  Help needed turning from your back to your side while in a flat bed without using bedrails?: None Help needed moving from lying on your back to sitting on the side of a flat bed without using bedrails?: None Help needed moving to and from a bed to a chair (including a wheelchair)?: A Little Help needed standing up from a chair using your arms (e.g.,  wheelchair or bedside chair)?: A Little Help needed to walk in hospital room?: A Little Help needed climbing 3-5 steps with a railing? : A Little 6 Click Score: 20    End of Session Equipment Utilized During Treatment: Gait belt Activity Tolerance: Patient tolerated treatment well Patient left: in chair;with call bell/phone within reach;with chair alarm set Nurse Communication: Mobility status PT Visit Diagnosis: Unsteadiness on feet (R26.81);Muscle weakness (generalized) (M62.81);Difficulty in walking, not elsewhere classified (R26.2)     Time: 1610-9604 PT Time Calculation (min) (ACUTE ONLY): 21 min  Charges:  $Gait Training: 8-22 mins                     Acasia Skilton B. Beverely Risen PT, DPT Acute Rehabilitation Services Please use secure chat or  Call Office [phone]    [facility] 07/08/2022, 9:34 AM

## 2022-07-08 NOTE — Progress Notes (Signed)
Mobility Specialist Criteria Algorithm Info.   07/08/22 1223  Mobility  Activity Ambulated with assistance in hallway  Range of Motion/Exercises Active;All extremities  Level of Assistance Standby assist, set-up cues, supervision of patient - no hands on  Assistive Device Front wheel walker  Distance Ambulated (ft) 90 ft  Activity Response Tolerated well  $Mobility charge 1 Mobility   Patient received dangling EOB agreeable to participate in mobility. Ambulated supervision with slow steady gait. Returned to room without complaint or incident. Was left dangling EOB with all needs met, call bell in reach.   [place] Veora Fonte, [place], [place]  OFHQR:[phone] Office: [phone]

## 2022-07-09 ENCOUNTER — Encounter (HOSPITAL_COMMUNITY): Payer: Self-pay | Admitting: Internal Medicine

## 2022-07-09 LAB — GLUCOSE, CAPILLARY
Glucose-Capillary: 108 mg/dL — ABNORMAL HIGH (ref 70–99)
Glucose-Capillary: 154 mg/dL — ABNORMAL HIGH (ref 70–99)
Glucose-Capillary: 109 mg/dL — ABNORMAL HIGH (ref 70–99)

## 2022-07-09 LAB — CBC WITH DIFFERENTIAL/PLATELET
Abs Immature Granulocytes: 0.04 10*3/uL (ref 0.00–0.07)
Basophils Absolute: 0.1 10*3/uL (ref 0.0–0.1)
Basophils Relative: 1 %
Eosinophils Absolute: 0.1 10*3/uL (ref 0.0–0.5)
Eosinophils Relative: 1 %
HCT: 31 % — ABNORMAL LOW (ref 39.0–52.0)
Hemoglobin: 10.1 g/dL — ABNORMAL LOW (ref 13.0–17.0)
Immature Granulocytes: 1 %
Lymphocytes Relative: 20 %
Lymphs Abs: 1.8 10*3/uL (ref 0.7–4.0)
MCH: 33.8 pg (ref 26.0–34.0)
MCHC: 32.6 g/dL (ref 30.0–36.0)
MCV: 103.7 fL — ABNORMAL HIGH (ref 80.0–100.0)
Monocytes Absolute: 1 10*3/uL (ref 0.1–1.0)
Monocytes Relative: 11 %
Neutro Abs: 5.9 10*3/uL (ref 1.7–7.7)
Neutrophils Relative %: 66 %
Platelets: 307 10*3/uL (ref 150–400)
RBC: 2.99 MIL/uL — ABNORMAL LOW (ref 4.22–5.81)
RDW: 13 % (ref 11.5–15.5)
WBC: 8.9 10*3/uL (ref 4.0–10.5)
nRBC: 0 % (ref 0.0–0.2)

## 2022-07-09 LAB — COMPREHENSIVE METABOLIC PANEL
ALT: 36 U/L (ref 0–44)
AST: 32 U/L (ref 15–41)
Albumin: 2 g/dL — ABNORMAL LOW (ref 3.5–5.0)
Alkaline Phosphatase: 118 U/L (ref 38–126)
Anion gap: 6 (ref 5–15)
BUN: 31 mg/dL — ABNORMAL HIGH (ref 6–20)
CO2: 22 mmol/L (ref 22–32)
Calcium: 9.4 mg/dL (ref 8.9–10.3)
Chloride: 108 mmol/L (ref 98–111)
Creatinine, Ser: 1.64 mg/dL — ABNORMAL HIGH (ref 0.61–1.24)
GFR, Estimated: 48 mL/min — ABNORMAL LOW (ref >60)
Glucose, Bld: 129 mg/dL — ABNORMAL HIGH (ref 70–99)
Potassium: 4.6 mmol/L (ref 3.5–5.1)
Sodium: 136 mmol/L (ref 135–145)
Total Bilirubin: 0.8 mg/dL (ref 0.3–1.2)
Total Protein: 8.6 g/dL — ABNORMAL HIGH (ref 6.5–8.1)

## 2022-07-09 LAB — SEDIMENTATION RATE: Sed Rate: 137 mm/hr — ABNORMAL HIGH (ref 0–16)

## 2022-07-09 MED ORDER — LACTATED RINGERS IV SOLN: INTRAVENOUS | Status: DC

## 2022-07-09 NOTE — Progress Notes (Signed)
Progress Note Patient: Jesse [name]:[id number] DOB: [birth year] DOA: 07/04/2022  DOS: the patient was seen and examined on 07/09/2022  Brief hospital course: 61 year old PMH of IDDM, diabetic foot infection, CKD 3, HTN, alcohol abuse, recently discharged from the hospital on 8/23 present to the hospital on 8/24 with complaints of generalized fatigue and near syncope.  Found to have AKI.  Assessment and Plan: AKI with non-anion gap metabolic acidosis Clinically appears to be volume contracted with signs of dehydration.   Most likely prerenal from poor p.o. intake. Treated aggressively with IV fluids.  IV fluid was stopped but now renal function worsening again and therefore IV fluid resume again as of 8/28.  Encourage p.o. fluids. Continue with IV fluids. Currently renal function improving and therefore we will stop the fluid and monitor. US renal negative for any acute abnormality or obstruction. On sodium bicarb chronically, metabolic acidosis improved with increased dose of bicarb. If continues to trend up will require further urine analysis work-up   Dizziness/lightheadedness Near syncope secondary to orthostasis hypotension, given the underlying volume contraction. Hold off home BP meds Orthostatic vitals were stable, minor blood pressure drop. No focal deficit.  Deconditioning According to the patient he has not been moving much for the last 2 to 3 weeks, and became very deconditioned.  PT OT evaluation recommend home with home health.   Right foot deep tissue infection Continue Cipro and Doxy. Renally adjusted. Outpatient follow-up with Dr. Sharol Given in 1 week. X-ray unremarkable. I have discussed with Dr. Sharol Given.  ESR elevated more than prior value CRP remained stable and elevated.   Hypoalbuminemia Likely chronic from poor p.o. intake and alcohol intake. Monitor.   Hyperkalemia Secondary to AKI, no significant T wave changes.   Treated with  Lokelma.  Hypomagnesemia. Replaced. Monitor.   Hyponatremia Hypovolemic, currently corrected.   Type 2 diabetes mellitus, uncontrolled with hyperglycemia with diabetic infection with long-acting insulin. Most recent A1c 8.3, given the worsening of kidney function, hold off long-acting insulin, start sliding scale. Sugars still well controlled without Levemir.  Monitor.   Alcohol abuse Denies any active drinking for last 3+ weeks.  Restless left leg. 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  On examination I do not find any significant findings of clonus or any other acute movement disorder. Monitor for now. Initiate gabapentin for neuropathy pain    Subjective: No acute complaint.  No nausea no vomiting.  Still reports burning pain in bilateral lower extremity.  Physical Exam: Vitals:   07/08/22 2123 07/09/22 0443 07/09/22 0836 07/09/22 1640  BP: 137/74 (!) 140/88  127/84  Pulse: 84 96  91  Resp: '18 18  18  ' Temp: 99.1 F (37.3 C) 98.1 F (36.7 C)  99 F (37.2 C)  TempSrc:    Oral  SpO2: 96% 95%  99%  Height:   6' (1.829 m)   General: Appear in mild distress; no visible Abnormal Neck Mass Or lumps, Conjunctiva normal Cardiovascular: S1 and S2 Present, no Murmur, Respiratory: good respiratory effort, Bilateral Air entry present and CTA, no Crackles, no wheezes Abdomen: Bowel Sound present, Non tender Extremities: Bilateral pedal edema, right lower extremity warm for more than yesterday.  No focal drainage compared to yesterday. Neurology: alert and oriented to Self, Place and time.  Data Reviewed: I have Reviewed nursing notes, Vitals, and Lab results since pt's last encounter. Pertinent lab results CBC and BMP I have ordered  test including CBC and BMP        Family Communication: No one at bedside  Disposition: Status is: Inpatient Remains inpatient renal function worsening.   Back on IV fluids.  Author: Berle Mull, MD 07/09/2022 9:16 PM  Please look on [email or web address] to find out who is on call.

## 2022-07-09 NOTE — TOC Progression Note (Signed)
Transition of Care [facility]) - Progression Note    Patient Details  Name: Jesse [name] MRN: [id number] Date of Birth: 07/25/1961  Transition of Care [facility]) CM/SW Contact  Tom-Johnson, Hershal Coria, RN Phone Number: 07/09/2022, 1:38 PM  Clinical Narrative:     Dr. Lajoyce Corners consulted for diabetic rt leg wound. CM will continue to follow with needs.  Expected Discharge Plan: Home w Home Health Services Barriers to Discharge: Continued Medical Work up  Expected Discharge Plan and Services Expected Discharge Plan: Home w Home Health Services   Discharge Planning Services: CM Consult Post Acute Care Choice: Home Health Living arrangements for the past 2 months: Apartment                 DME Arranged: N/A DME Agency: NA       HH Arranged: PT, OT, RN, Social Work [employer] Agency: [facility] Date HH Agency Contacted: 07/05/22 Time HH Agency Contacted: 1505 Representative spoke with at [facility] Agency: Tresa Endo- Awaiting response for [employer].   Social Determinants of Health (SDOH) Interventions    Readmission Risk Interventions     No data to display

## 2022-07-09 NOTE — Consult Note (Signed)
WOC consulted for RLE wound, patient known to [facility] nursing team, last seen last week. Patient followed by Dr. Lajoyce Corners for RLE wounds, s/p grafting and surgical intervention.  Profore wrap ordered and patient removed, Unna's boot ordered per Dr. Lajoyce Corners however unclear if patient allowed placement, orthopedic techs place unna's boots.   I have requested Dr. Beryl Meager to consult Dr. Lajoyce Corners, since he is directing the POC for this patient both inpatient and outpatient.Armen Pickup MSN,RN,CWOCN, CNS, CWON-AP [phone]

## 2022-07-09 NOTE — Progress Notes (Addendum)
Mobility Specialist Criteria Algorithm Info.   07/09/22 1040  Mobility  Activity Ambulated with assistance in room;Transferred from bed to chair  Range of Motion/Exercises Active;All extremities  Level of Assistance Standby assist, set-up cues, supervision of patient - no hands on  Assistive Device Front wheel walker  Distance Ambulated (ft) 25 ft  Activity Response Tolerated well   Patient received in supine agreeable to participate in mobility. Ambulated short distance in room at supervision level with steady gait. Deferred hallway ambulation for unspecified reasons. Stated he was in pain but could not rate or specify the location. Tolerated without incident. Was left in recliner with all needs met, call bell in reach.   [place] Yechiel Erny, [place], [place]  LUDAP:[phone] Office: [phone]

## 2022-07-10 DIAGNOSIS — E875 Hyperkalemia: Secondary | ICD-10-CM

## 2022-07-10 DIAGNOSIS — E872 Acidosis, unspecified: Secondary | ICD-10-CM

## 2022-07-10 DIAGNOSIS — E1140 Type 2 diabetes mellitus with diabetic neuropathy, unspecified: Secondary | ICD-10-CM

## 2022-07-10 LAB — CBC WITH DIFFERENTIAL/PLATELET
Abs Immature Granulocytes: 0.03 10*3/uL (ref 0.00–0.07)
Basophils Absolute: 0.1 10*3/uL (ref 0.0–0.1)
Basophils Relative: 1 %
Eosinophils Absolute: 0.2 10*3/uL (ref 0.0–0.5)
Eosinophils Relative: 2 %
HCT: 31.3 % — ABNORMAL LOW (ref 39.0–52.0)
Hemoglobin: 10.5 g/dL — ABNORMAL LOW (ref 13.0–17.0)
Immature Granulocytes: 0 %
Lymphocytes Relative: 25 %
Lymphs Abs: 2 10*3/uL (ref 0.7–4.0)
MCH: 34.7 pg — ABNORMAL HIGH (ref 26.0–34.0)
MCHC: 33.5 g/dL (ref 30.0–36.0)
MCV: 103.3 fL — ABNORMAL HIGH (ref 80.0–100.0)
Monocytes Absolute: 0.8 10*3/uL (ref 0.1–1.0)
Monocytes Relative: 9 %
Neutro Abs: 5.1 10*3/uL (ref 1.7–7.7)
Neutrophils Relative %: 63 %
Platelets: 295 10*3/uL (ref 150–400)
RBC: 3.03 MIL/uL — ABNORMAL LOW (ref 4.22–5.81)
RDW: 13 % (ref 11.5–15.5)
WBC: 8.2 10*3/uL (ref 4.0–10.5)
nRBC: 0 % (ref 0.0–0.2)

## 2022-07-10 LAB — COMPREHENSIVE METABOLIC PANEL
ALT: 33 U/L (ref 0–44)
AST: 30 U/L (ref 15–41)
Albumin: 2.1 g/dL — ABNORMAL LOW (ref 3.5–5.0)
Alkaline Phosphatase: 122 U/L (ref 38–126)
Anion gap: 11 (ref 5–15)
BUN: 26 mg/dL — ABNORMAL HIGH (ref 6–20)
CO2: 19 mmol/L — ABNORMAL LOW (ref 22–32)
Calcium: 9.5 mg/dL (ref 8.9–10.3)
Chloride: 105 mmol/L (ref 98–111)
Creatinine, Ser: 1.48 mg/dL — ABNORMAL HIGH (ref 0.61–1.24)
GFR, Estimated: 54 mL/min — ABNORMAL LOW (ref >60)
Glucose, Bld: 175 mg/dL — ABNORMAL HIGH (ref 70–99)
Potassium: 4.6 mmol/L (ref 3.5–5.1)
Sodium: 135 mmol/L (ref 135–145)
Total Bilirubin: 0.4 mg/dL (ref 0.3–1.2)
Total Protein: 8.8 g/dL — ABNORMAL HIGH (ref 6.5–8.1)

## 2022-07-10 LAB — GLUCOSE, CAPILLARY
Glucose-Capillary: 117 mg/dL — ABNORMAL HIGH (ref 70–99)
Glucose-Capillary: 122 mg/dL — ABNORMAL HIGH (ref 70–99)
Glucose-Capillary: 154 mg/dL — ABNORMAL HIGH (ref 70–99)

## 2022-07-10 LAB — MAGNESIUM: Magnesium: 1.3 mg/dL — ABNORMAL LOW (ref 1.7–2.4)

## 2022-07-10 MED ORDER — GABAPENTIN 100 MG PO CAPS: 100.0000 mg | ORAL_CAPSULE | Freq: Three times a day (TID) | ORAL | 0 refills | Status: DC

## 2022-07-10 MED ORDER — SODIUM BICARBONATE 650 MG PO TABS: 1300.0000 mg | ORAL_TABLET | Freq: Two times a day (BID) | ORAL | 0 refills | Status: AC

## 2022-07-10 NOTE — Progress Notes (Signed)
Physical Therapy Treatment Patient Details Name: Jesse [name] MRN: [id number] DOB: Apr 29, 1961 Today's Date: 07/10/2022   History of Present Illness 61 y/o male presented to ED via EMS called by neighbor after welfare check one day after discharge from hospital with c/o of dizziness and weakness. Found to have decreased BP and elevated creatinine. Admitted for observation and treatment of AKI PMH:  IDDM, recent right foot diabetic foot infection on chronic antibiotics, CKD stage IIIa, HTN, alcohol abuse    PT Comments    Pt mobilization improved however seems to have increased difficulty with short term memory within sessions. Attempts to camouflage by repeatedly saying "okay, okay". Surgeon had just looked at R LE wound and RN to redress. D/c plans remain appropriate from a mobility level, however given decreased short term memory pt will likely need 24 hour assist, especially to address wound care and medications.    Recommendations for follow up therapy are one component of a multi-disciplinary discharge planning process, led by the attending physician.  Recommendations may be updated based on patient status, additional functional criteria and insurance authorization.  Follow Up Recommendations  Home health PT     Assistance Recommended at Discharge Frequent or constant Supervision/Assistance  Patient can return home with the following A little help with walking and/or transfers;A little help with bathing/dressing/bathroom;Assistance with cooking/housework;Direct supervision/assist for medications management;Direct supervision/assist for financial management;Assist for transportation   Equipment Recommendations  None recommended by PT       Precautions / Restrictions Precautions Precautions: Fall Precaution Comments: B LE wounds Restrictions Weight Bearing Restrictions: No     Mobility  Bed Mobility Overal bed mobility: Modified Independent             General bed  mobility comments: increased time.    Transfers Overall transfer level: Needs assistance Equipment used: Rolling walker (2 wheels) Transfers: Sit to/from Stand, Bed to chair/wheelchair/BSC Sit to Stand: Min guard, From elevated surface           General transfer comment: min guard for safety, no dizziness, despite cuing prefers to have L LE outside RW for power up, after cues for scooting forward is able to remember proper hand placement for power up    Ambulation/Gait Ambulation/Gait assistance: Min assist, Min guard Gait Distance (Feet): 200 Feet Assistive device: Rolling walker (2 wheels) Gait Pattern/deviations: Decreased step length - left, Decreased step length - right, Antalgic, Decreased stance time - right, Step-to pattern, Trunk flexed Gait velocity: decreased     General Gait Details: better RW management today, able to keep on ground, light min A for safety with increased ambulation distance.         Balance Overall balance assessment: Needs assistance Sitting-balance support: No upper extremity supported, Feet supported Sitting balance-Leahy Scale: Good     Standing balance support: Bilateral upper extremity supported, Reliant on assistive device for balance Standing balance-Leahy Scale: Fair Standing balance comment: benefit from UE support for steadying with dynamic activities                            Cognition Arousal/Alertness: Awake/alert Behavior During Therapy: Flat affect Overall Cognitive Status: Impaired/Different from baseline Area of Impairment: Attention, Memory, Safety/judgement, Awareness, Problem solving, Orientation                 Orientation Level: Disoriented to, Time Current Attention Level: Selective Memory: Decreased short-term memory   Safety/Judgement: Decreased awareness of deficits, Decreased awareness of safety  Awareness: Intellectual Problem Solving: Decreased initiation, Difficulty sequencing, Requires  verbal cues, Requires tactile cues, Slow processing General Comments: short term memory within session impaired, says "okay, okay." when reminded of what was already stated earlier           General Comments General comments (skin integrity, edema, etc.): VSS on RA, R LE wound uncovered by Vascular Surgeon, RN to redress      Pertinent Vitals/Pain Pain Assessment Pain Assessment: Faces Faces Pain Scale: Hurts a little bit Pain Location: R LE Pain Descriptors / Indicators: Aching, Grimacing Pain Intervention(s): Limited activity within patient's tolerance, Monitored during session, Repositioned     PT Goals (current goals can now be found in the care plan section) Acute Rehab PT Goals Patient Stated Goal: get back home PT Goal Formulation: With patient Time For Goal Achievement: 07/18/22 Potential to Achieve Goals: Fair Progress towards PT goals: Progressing toward goals    Frequency    Min 3X/week      PT Plan Current plan remains appropriate       AM-PAC PT "6 Clicks" Mobility   Outcome Measure  Help needed turning from your back to your side while in a flat bed without using bedrails?: None Help needed moving from lying on your back to sitting on the side of a flat bed without using bedrails?: None Help needed moving to and from a bed to a chair (including a wheelchair)?: A Little Help needed standing up from a chair using your arms (e.g., wheelchair or bedside chair)?: A Little Help needed to walk in hospital room?: A Little Help needed climbing 3-5 steps with a railing? : A Little 6 Click Score: 20    End of Session Equipment Utilized During Treatment: Gait belt Activity Tolerance: Patient tolerated treatment well Patient left: in chair;with call bell/phone within reach;with chair alarm set Nurse Communication: Mobility status PT Visit Diagnosis: Unsteadiness on feet (R26.81);Muscle weakness (generalized) (M62.81);Difficulty in walking, not elsewhere  classified (R26.2)     Time: 1962-2297 PT Time Calculation (min) (ACUTE ONLY): 17 min  Charges:  $Gait Training: 8-22 mins                     Alfred Harrel B. Beverely Risen PT, DPT Acute Rehabilitation Services Please use secure chat or  Call Office [phone]    [facility]on [facility] 07/10/2022, 2:26 PM

## 2022-07-10 NOTE — Discharge Instructions (Signed)
Jesse [name],  You were in the hospital because of weakness. This appears to be related to some dehydration. You received some IV fluids to help. Please continue to stay well hydrated. Thankfully, you have completed your antibiotic course. Please follow-up with Dr. Lajoyce Corners, the wound clinic and your PCP.

## 2022-07-10 NOTE — Progress Notes (Signed)
Orthopedic Tech Progress Note Patient Details:  Jesse [name] March 07, 1961 [id number]  Ortho Tech notified RN of wound on lateral side of RLE. RN asked for the measurements of the wound and the ortho tech recorded a 7 cm diameter with a 8 cm length. RN told ortho tech that an ABD pad was not necessary as the wound had no discharge.  Ortho Devices Type of Ortho Device: Radio broadcast assistant Ortho Device/Splint Location: RLE, LLE Ortho Device/Splint Interventions: Ordered, Application, Adjustment   Post Interventions Patient Tolerated: Well  [name] 07/10/2022, 10:52 AM

## 2022-07-10 NOTE — TOC Transition Note (Addendum)
Transition of Care [facility]) - CM/SW Discharge Note   Patient Details  Name: Jesse [name] MRN: [id number] Date of Birth: 1960-12-19  Transition of Care [facility]) CM/SW Contact:  Tom-Johnson, Hershal Coria, RN Phone Number: 07/10/2022, 3:28 PM   Clinical Narrative:     Patient is scheduled for discharge today. Home health and f/u info on AVS. [employer] voucher given to RN. Sister [name] notified of discharge. No further TOC needs noted.   Final next level of care: Home w Home Health Services Barriers to Discharge: Barriers Resolved   Patient Goals and CMS Choice Patient states their goals for this hospitalization and ongoing recovery are:: To return home CMS Medicare.gov Compare Post Acute Care list provided to:: Patient Choice offered to / list presented to : Patient, Sibling (Sister, [name].)  Discharge Placement                Patient to be transferred to facility by: [employer]      Discharge Plan and Services   Discharge Planning Services: CM Consult Post Acute Care Choice: Home Health          DME Arranged: N/A DME Agency: NA       HH Arranged: PT, OT, RN, Social Work HH Agency: [facility] Date [facility] Agency Contacted: 07/05/22 Time HH Agency Contacted: 1505 Representative spoke with at [facility] Agency: Kandee Keen  Social Determinants of Health (SDOH) Interventions     Readmission Risk Interventions     No data to display

## 2022-07-10 NOTE — Progress Notes (Signed)
Patient given discharge instructions and stated understanding. 

## 2022-07-10 NOTE — Progress Notes (Signed)
Mobility Specialist Criteria Algorithm Info.   07/10/22 1430  Mobility  Activity Ambulated with assistance in hallway (in reclinre chair before and after)  Range of Motion/Exercises Active;All extremities  Level of Assistance Contact guard assist, steadying assist  Assistive Device Front wheel walker  Distance Ambulated (ft) 120 ft  Activity Response Tolerated well  Transport method Ambulatory   Patient received in recliner agreeable to participate in mobility. Ambulated min guard with slow steady gait. Returned to room without complaint or incident. Was left in recliner with all needs met, call bell in reach.   [place] Chisom Muntean, [place], [place]  EHUDJ:[phone] Office: [phone]

## 2022-07-10 NOTE — Consult Note (Signed)
   [facility] CM Inpatient Consult   07/10/2022  Jesse [name] 1961/01/31 [id number]  [facility] Referral:  on banner and showing as readmission  Primary Care Provider: Pcp, No  Insurance:  none pending Medicaid per inpatient TOC RNCM  *Met with the patient at the bedside, patient states he has no insurance, lives alone in an apartment. Uses [name], and his sister lives in [place].   Patient recently changed insurance plans and not listed with this plan for Care Management services at this time.   Reason:  Not a beneficiary currently attributed to one of the [place].   Spoke with inpatient [name] RNCM about patient on list but not eligible for [name] Care Management services.   Signing off For additional questions or referrals please contact:    For questions, please call:  [name], RN BSN [name]  [phone] business mobile phone Toll free office [phone]  Fax number: [phone] [name]

## 2022-07-10 NOTE — Discharge Summary (Addendum)
Physician Discharge Summary   Patient: Jesse [name] MRN: [id number] DOB: 1961/09/21  Admit date:     07/04/2022  Discharge date: 07/10/22  Discharge Physician: Jacquelin Hawking, MD   PCP: Pcp, No   Recommendations at discharge:  Follow-up with Dr. Lajoyce Corners (orthopedic surgery) Follow-up with wound clinic Repeat BMP in 3-5 days  Discharge Diagnoses: Principal Problem:   AKI (acute kidney injury) (HCC) Active Problems:   DM (diabetes mellitus) (HCC)   Hyperkalemia   Chronic painful diabetic neuropathy (HCC)   Metabolic acidosis, normal anion gap (NAG)   Hypomagnesemia  Resolved Problems:   * No resolved hospital problems. [facility] Course: 61 year old PMH of IDDM, diabetic foot infection, CKD 3, HTN, alcohol abuse, recently discharged from the hospital on 8/23 present to the hospital on 8/24 with complaints of generalized fatigue and near syncope.  Found to have AKI and was treated with IV fluids. Discharge complicated by worsening creatinine which again resolved with IV fluids.  Assessment and Plan:  AKI Baseline creatinine is about 1.2. Creatinine of 2.02 on admission. Volume contracted on admission. Patient started on IV fluids and encouraged to increase oral fluid intake. Renal ultrasound negative for obstructive disease. Creatinine improving prior to discharge home. Creatinine of 1.48 on day of discharge.  Non-anion gap metabolic acidosis Likely related to AKI. Patient started on bicarbonate supplementation. Continue sodium bicarbonate on discharge.  Lightheadedness Secondary to orthostatic hypotension. Improved with IV fluids. PT/OT recommending home health services.  Right foot deep tissue infection Patient previously evaluated for this on recent admission with ID recommendations for 2 weeks of total antibiotic therapy starting from 8/13. Patient has currently completed recommended course. Discontinue Ciprofloxacin and Doxycycline on discharge.  Hyperkalemia Secondary to  AKI. Patient given Lokelma with resolution.  Hypomagnesemia Supplementation given.  Hyponatremia Hypovolemic hyponatremia which resolved with IV fluids.  Diabetes mellitus type 2 Diabetes is uncontrolled with hyperglycemia. Most recent hemoglobin A1C of 8.3%. Patient is on Lantus as an outpatient. Continue outpatient regimen.  Alcohol abuse History of. Patient denies recent alcohol intake.  Chronic neuropathy pain Bilateral lower extremities. Started on low dose gabapentin. Will discharge on gabapentin 100 mg TID. Titrate as needed.   Consultants: None Procedures performed: None  Disposition: Home health Diet recommendation: Carb modified diet  DISCHARGE MEDICATION: Allergies as of 07/10/2022   No Known Allergies      Medication List     STOP taking these medications    ciprofloxacin 500 MG tablet Commonly known as: CIPRO   doxycycline 100 MG tablet Commonly known as: VIBRA-TABS       TAKE these medications    gabapentin 100 MG capsule Commonly known as: NEURONTIN Take 1 capsule (100 mg total) by mouth 3 (three) times daily.   Gerhardt's butt cream Crea Apply 1 Application topically 3 (three) times daily. Apply to inner thighs and buttocks liberally   Insulin Pen Needle 32G X 4 MM Misc Use as directed with insulin pen   Lantus SoloStar 100 UNIT/ML Solostar Pen Generic drug: insulin glargine INJECT 10 UNITS INTO THE SKIN DAILY.   sodium bicarbonate 650 MG tablet Take 2 tablets (1,300 mg total) by mouth 2 (two) times daily for 3 days.   thiamine 100 MG tablet Commonly known as: VITAMIN B1 Take 1 tablet (100 mg total) by mouth daily.        Follow-up Information     [facility] Follow up.   Specialty: Home Health Services Why: Someone will call you to schedule first  home visit. Contact information: [address] [place] [address] [phone]         Nadara Mustard, MD. Schedule an appointment as soon as  possible for a visit in 1 week(s).   Specialty: Orthopedic Surgery Why: For hospital follow-up, For wound re-check Contact information: [address] [place] [address] [phone]                Discharge Exam: BP 126/83 (BP Location: Right Arm)   Pulse 80   Temp 98.3 F (36.8 C) (Oral)   Resp 17   Ht 6' (1.829 m)   SpO2 95%   BMI 32.14 kg/m   General exam: Appears calm and comfortable Respiratory system: Clear to auscultation. Respiratory effort normal. Cardiovascular system: S1 & S2 heard, RRR. Gastrointestinal system: Abdomen is nondistended, soft and nontender. Central nervous system: Alert and oriented. No focal neurological deficits. Musculoskeletal: No calf tenderness Psychiatry: Judgement and insight appear normal. Mood & affect appropriate.   Condition at discharge: stable  The results of significant diagnostics from this hospitalization (including imaging, microbiology, ancillary and laboratory) are listed below for reference.   Imaging Studies: DG Tibia/Fibula Right Port  Result Date: 07/08/2022 CLINICAL DATA:  Pain and swelling EXAM: PORTABLE RIGHT TIBIA AND FIBULA - 2 VIEW COMPARISON:  06/20/2022 FINDINGS: No fracture or dislocation is seen. There are no focal lytic lesions. Small plantar spur is seen in calcaneus. Degenerative changes are noted in right knee, more so in medial and patellofemoral compartments. There is soft tissue prominence in suprapatellar bursa. There is a diffuse subcutaneous edema. Arterial calcifications are seen in soft tissues. IMPRESSION: No fracture or dislocation is seen. There are no focal lytic lesions. Degenerative changes are noted in right knee. Possible effusion is present in the suprapatellar bursa in right knee. Small plantar spur is seen in calcaneus. Electronically Signed   By: Ernie Avena M.D.   On: 07/08/2022 18:48   US RENAL  Result Date: 07/04/2022 CLINICAL DATA:  Acute renal injury EXAM: RENAL /  URINARY TRACT ULTRASOUND COMPLETE COMPARISON:  Ultrasound 06/23/2022 FINDINGS: Right Kidney: Renal measurements: 14.5 x 7.2 x 7.5 cm = volume: 412 mL. No hydronephrosis. Small amount of perinephric fluid. Left Kidney: Not identified.  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 Bladder: Appears normal for degree of bladder distention. Other: None. IMPRESSION: 1. Compensatory hypertrophy of the RIGHT kidney. Small amount pericholecystic fluid. No hydronephrosis. 2. 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   On: 07/04/2022 15:34   DG Chest Port 1 View  Result Date: 07/04/2022 CLINICAL DATA:  weakness EXAM: PORTABLE CHEST 1 VIEW COMPARISON:  Chest x-ray 06/25/2022. FINDINGS: Similar low lung volumes with elevated right hemidiaphragm. No visible consolidation. No visible pleural effusions or pneumothorax. Cardiomediastinal silhouette is unchanged. IMPRESSION: No evidence of acute cardiopulmonary disease. Electronically Signed   By: Feliberto Harts M.D.   On: 07/04/2022 10:58   VAS [place] ABI WITH/WO TBI  Result Date: 06/24/2022  LOWER EXTREMITY DOPPLER STUDY Patient Name:  RUTILIO YELLOWHAIR  Date of Exam:   06/24/2022 Medical Rec #: [id number]         Accession #:    [id number] Date of Birth: [birth year]          Patient Gender: M Patient Age:   46 years Exam Location:  [facility] Procedure:      VAS [place] ABI WITH/WO TBI Referring Phys: Odette Fraction --------------------------------------------------------------------------------  Indications: Rest pain. High Risk Factors: Hypertension,  hyperlipidemia, Diabetes.  Limitations: Today's exam was limited due to an open wound. Comparison Study: 02/08/2021 - Summary:                   Right: Resting right ankle-brachial index indicates                   noncompressible right                   lower extremity arteries.                    Unable to obtain TBI secondary to toe size/ulceration.                   Left: Resting left  ankle-brachial index indicates                   noncompressible left                   lower extremity arteries.                    Unable to obtain TBI secondary to toe size. Performing Technologist: Olen Cordialollins, Greg RVT  Examination Guidelines: A complete evaluation includes at minimum, Doppler waveform signals and systolic blood pressure reading at the level of bilateral brachial, anterior tibial, and posterior tibial arteries, when vessel segments are accessible. Bilateral testing is considered an integral part of a complete examination. Photoelectric Plethysmograph (PPG) waveforms and toe systolic pressure readings are included as required and additional duplex testing as needed. Limited examinations for reoccurring indications may be performed as noted.  ABI Findings: +---------+------------------+-----+-----------+--------+ Right    Rt Pressure (mmHg)IndexWaveform   Comment  +---------+------------------+-----+-----------+--------+ Brachial 154                    triphasic           +---------+------------------+-----+-----------+--------+ PTA      198               1.29 multiphasic         +---------+------------------+-----+-----------+--------+ DP       165               1.07 multiphasic         +---------+------------------+-----+-----------+--------+ Great Toe83                0.54                     +---------+------------------+-----+-----------+--------+ +---------+------------------+-----+-----------+----------+ Left     Lt Pressure (mmHg)IndexWaveform   Comment    +---------+------------------+-----+-----------+----------+ Brachial 145                    triphasic             +---------+------------------+-----+-----------+----------+ PTA      204               1.32 multiphasic           +---------+------------------+-----+-----------+----------+ DP       190               1.23 multiphasic            +---------+------------------+-----+-----------+----------+ Great Toe                                  Ulceration +---------+------------------+-----+-----------+----------+ +-------+-----------+-----------+------------+------------+ ABI/TBIToday's ABIToday's TBIPrevious ABIPrevious TBI +-------+-----------+-----------+------------+------------+  Right  1.29       0.54                                +-------+-----------+-----------+------------+------------+ Left   1.32                                           +-------+-----------+-----------+------------+------------+  Summary: Right: Resting right ankle-brachial index is within normal range. The right toe-brachial index is abnormal. Left: Resting left ankle-brachial index indicates noncompressible left lower extremity arteries. Unable to obtain TBI due to great toe ulceration. *See table(s) above for measurements and observations.  Electronically signed by Lemar Livings MD on 06/24/2022 at 2:21:06 PM.    Final    US Abdomen Limited RUQ (LIVER/GB)  Result Date: 06/23/2022 CLINICAL DATA:  Transaminitis. EXAM: ULTRASOUND ABDOMEN LIMITED RIGHT UPPER QUADRANT COMPARISON:  None Available. FINDINGS: Gallbladder: No gallstones or wall thickening visualized. Gallbladder sludge is present. No sonographic Murphy sign noted by sonographer. Common bile duct: Diameter: 3.3 mm Liver: No focal lesion identified. Increase in parenchymal echogenicity. Portal vein is patent on color Doppler imaging with normal direction of blood flow towards the liver. Other: None. IMPRESSION: 1. Gallbladder sludge. 2. No cholelithiasis. 3. Echogenic liver likely related to diffuse fatty infiltration. Electronically Signed   By: Darliss Cheney M.D.   On: 06/23/2022 20:39   MRI Right foot without contrast  Result Date: 06/23/2022 CLINICAL DATA:  Diabetic foot wounds EXAM: MRI OF THE RIGHT FOREFOOT WITHOUT CONTRAST TECHNIQUE: Multiplanar, multisequence MR imaging of the  right forefoot was performed. No intravenous contrast was administered. COMPARISON:  X-ray 06/20/2022, MRI 04/14/2021 FINDINGS: Bones/Joint/Cartilage Small second tarsometatarsal joint effusion. Bone marrow edema within the second metatarsal base extending to the level of the distal diaphysis with confluent low T1 signal changes in the second metatarsal base (series 6, image 14). There is patchy bone marrow edema and intermediate T1 signal within the adjacent intermediate cuneiform. Findings are suspicious for septic arthritis with osteomyelitis. Patchy areas of marrow edema are also present within the navicular, medial and lateral cuneiform bones, and second metatarsal base. No focal erosion or confluent low T1 signal changes at these locations to suggest acute osteomyelitis at this time. No acute fracture or dislocation. Susceptibility artifact between the second and third toes as well as between the third and fourth toes with resultant poor regional fat saturation. Ligaments Intact Lisfranc ligament.  Collateral ligaments appear intact. Muscles and Tendons Findings of myositis and/or denervation. No large tenosynovial fluid collection. Soft tissues Plantar soft tissue ulceration underlying the midfoot. Prominent dorsal subcutaneous edema. No organized fluid collection is evident on noncontrast imaging. Additional site of susceptibility artifact within the plantar soft tissues adjacent to the site of ulceration corresponding to surgical clips seen radiographically. IMPRESSION: 1. 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 2. Patchy areas of marrow edema within the midfoot without focal erosion which could represent reactive osteitis or stress-related changes. 3. Plantar soft tissue ulceration underlying the midfoot. 4. Marked dorsal subcutaneous edema and likely cellulitis. No organized fluid collections. 5. Findings of diffuse  myositis and/or denervation. Electronically Signed   By: Duanne Guess D.O.   On: 06/23/2022 13:06   US RENAL  Result Date: 06/23/2022 CLINICAL DATA:  Acute renal injury EXAM: RENAL / URINARY  TRACT ULTRASOUND COMPLETE COMPARISON:  CT from 02/09/2021 FINDINGS: Right Kidney: Renal measurements: 14.7 x 7.4 x 7.3 cm. = volume: 416 mL. Echogenicity within normal limits. No mass or hydronephrosis visualized. Left Kidney: Dysplastic left kidney is not visualized. Bladder: Appears normal for degree of bladder distention. Other: None. 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 No acute abnormality noted. Electronically Signed   By: Alcide Clever M.D.   On: 06/23/2022 01:07   DG Chest Port 1 View  Result Date: 06/22/2022 CLINICAL DATA:  Questionable sepsis.  Altered mental status. EXAM: PORTABLE CHEST 1 VIEW COMPARISON:  Portable chest 04/14/2021 FINDINGS: The cardiac size is normal. No vascular congestion is seen. There is a normal mediastinal configuration. Again noted is prominent elevation of the right hemidiaphragm with overlying linear atelectasis. This obscures the right lower lung field the visualized lungs are clear. The sulci are sharp. Thoracic spondylosis. IMPRESSION: The visualized lungs are clear, but with obscuration of the right lower lung field due to a chronically elevated right hemidiaphragm. The cardiac size is normal. Electronically Signed   By: Almira Bar M.D.   On: 06/22/2022 21:36   DG Tibia/Fibula Left  Result Date: 06/20/2022 CLINICAL DATA:  Right lower extremity cellulitis. EXAM: LEFT TIBIA AND FIBULA - 2 VIEW COMPARISON:  None Available. FINDINGS: There is no evidence of fracture or other focal bone lesions. No lytic destruction is noted. Soft tissues are unremarkable. IMPRESSION: Negative. Electronically Signed   By: Lupita Raider M.D.   On: 06/20/2022 16:53   DG Tibia/Fibula Right  Result Date: 06/20/2022 CLINICAL DATA:  Right lower extremity  cellulitis. EXAM: RIGHT TIBIA AND FIBULA - 2 VIEW COMPARISON:  None Available. FINDINGS: There is no evidence of fracture or other focal bone lesions. No lytic destruction is seen. Soft tissues are unremarkable. IMPRESSION: Negative. Electronically Signed   By: Lupita Raider M.D.   On: 06/20/2022 16:52   DG Foot Complete Right  Result Date: 06/20/2022 CLINICAL DATA:  Cellulitis. EXAM: RIGHT FOOT COMPLETE - 3+ VIEW COMPARISON:  None Available. FINDINGS: There is no evidence of fracture or dislocation. There is no evidence of arthropathy or other focal bone abnormality. Dorsal soft tissue swelling is noted. Large ulceration is seen involving the plantar soft tissues. Metallic staple is seen in this area concerning for foreign body. IMPRESSION: Dorsal soft tissue swelling is noted concerning for cellulitis. Large ulceration is seen involving the plantar soft tissues with adjacent metallic staple in this area concerning for foreign body. No definite lytic destruction is seen to suggest osteomyelitis. Electronically Signed   By: Lupita Raider M.D.   On: 06/20/2022 16:51   DG Foot Complete Left  Result Date: 06/20/2022 CLINICAL DATA:  Possible osteomyelitis.  Cellulitis. EXAM: LEFT FOOT - COMPLETE 3+ VIEW COMPARISON:  None Available. FINDINGS: Large ulceration is seen overlying the first distal phalanx. There appears to be lytic destruction involving the distal tuft of the first distal phalanx consistent with osteomyelitis. No fracture or dislocation is noted. IMPRESSION: Large ulceration involving the first toe with underlying lytic destruction of the distal tuft of first distal phalanx consistent with osteomyelitis. Electronically Signed   By: Lupita Raider M.D.   On: 06/20/2022 16:49    Microbiology: Results for orders placed or performed during the hospital encounter of 06/22/22  Blood Culture (routine x 2)     Status: None   Collection Time: 06/22/22  8:00 PM   Specimen: BLOOD  Result Value Ref  Range Status  Specimen Description BLOOD LEFT ANTECUBITAL  Final   Special Requests   Final    BOTTLES DRAWN AEROBIC AND ANAEROBIC Blood Culture results may not be optimal due to an excessive volume of blood received in culture bottles   Culture   Final    NO GROWTH 5 DAYS Performed at [facility], [address] [place], [place] [address]    Report Status 06/27/2022 FINAL  Final  Blood Culture (routine x 2)     Status: None   Collection Time: 06/22/22  8:10 PM   Specimen: BLOOD  Result Value Ref Range Status   Specimen Description BLOOD RIGHT ANTECUBITAL  Final   Special Requests   Final    BOTTLES DRAWN AEROBIC AND ANAEROBIC Blood Culture adequate volume   Culture   Final    NO GROWTH 5 DAYS Performed at [facility], [address], [place], [place] [address]    Report Status 06/27/2022 FINAL  Final  Urine Culture     Status: Abnormal   Collection Time: 06/22/22  9:21 PM   Specimen: In/Out Cath Urine  Result Value Ref Range Status   Specimen Description IN/OUT CATH URINE  Final   Special Requests NONE  Final   Culture (A)  Final    <10,000 COLONIES/mL INSIGNIFICANT GROWTH Performed at [facility], [address] [place], [place] [address]    Report Status 06/24/2022 FINAL  Final    Labs: CBC: Recent Labs  Lab 07/06/22 0453 07/07/22 1151 07/08/22 0505 07/09/22 0904 07/10/22 0808  WBC 11.0* 9.4 8.7 8.9 8.2  NEUTROABS  --   --   --  5.9 5.1  HGB 10.2* 10.2* 9.9* 10.1* 10.5*  HCT 29.8* 29.8* 29.5* 31.0* 31.3*  MCV 100.3* 100.3* 102.1* 103.7* 103.3*  PLT 290 318 300 307 295   Basic Metabolic Panel: Recent Labs  Lab 07/06/22 0453 07/07/22 1151 07/08/22 0505 07/09/22 0904 07/10/22 0808  NA 134* 136 134* 136 135  K 4.8 4.9 4.6 4.6 4.6  CL 110 109 110 108 105  CO2 18* 21* 20* 22 19*  GLUCOSE 109* 153* 121* 129* 175*  BUN 28* 20 27* 31* 26*  CREATININE 1.25* 1.21 1.65* 1.64* 1.48*  CALCIUM 9.0 9.2 8.8* 9.4 9.5  MG 1.2* 1.3* 1.6*  --   1.3*   Liver Function Tests: Recent Labs  Lab 07/09/22 0904 07/10/22 0808  AST 32 30  ALT 36 33  ALKPHOS 118 122  BILITOT 0.8 0.4  PROT 8.6* 8.8*  ALBUMIN 2.0* 2.1*   CBG: Recent Labs  Lab 07/09/22 1147 07/09/22 1643 07/10/22 0017 07/10/22 0820 07/10/22 1125  GLUCAP 154* 109* 117* 154* 122*    Discharge time spent: 35 minutes.  Signed: Jacquelin Hawking, MD [facility] 07/10/2022

## 2022-07-17 ENCOUNTER — Ambulatory Visit (INDEPENDENT_AMBULATORY_CARE_PROVIDER_SITE_OTHER): Payer: Medicaid Other | Admitting: Family

## 2022-07-17 ENCOUNTER — Encounter: Payer: Self-pay | Admitting: Family

## 2022-07-17 ENCOUNTER — Inpatient Hospital Stay (INDEPENDENT_AMBULATORY_CARE_PROVIDER_SITE_OTHER): Payer: Medicaid Other | Admitting: Primary Care

## 2022-07-17 DIAGNOSIS — R600 Localized edema: Secondary | ICD-10-CM | POA: Diagnosis not present

## 2022-07-17 DIAGNOSIS — L98499 Non-pressure chronic ulcer of skin of other sites with unspecified severity: Secondary | ICD-10-CM

## 2022-07-17 DIAGNOSIS — I872 Venous insufficiency (chronic) (peripheral): Secondary | ICD-10-CM | POA: Diagnosis not present

## 2022-07-17 DIAGNOSIS — L97511 Non-pressure chronic ulcer of other part of right foot limited to breakdown of skin: Secondary | ICD-10-CM

## 2022-07-17 LAB — ORGANISM ID BY SEQUENCING

## 2022-07-17 LAB — AEROBIC BACTERIA, ID BY SEQ.

## 2022-07-17 NOTE — Progress Notes (Signed)
Office Visit Note   Patient: Jesse [name]           Date of Birth: May 11, 1961           MRN: [id number] Visit Date: 07/17/2022              Requested by: No referring provider defined for this encounter. PCP: Pcp, No  Chief Complaint  Patient presents with   Left Leg - Follow-up   Right Leg - Follow-up      HPI: The patient is a 61 year old gentleman who was recently hospitalized on August 30.  Today he is seen for bilateral lower extremity edema which is quite significant.  He has a large wound to the lateral right calf as well as a chronic ulcer beneath the right foot he is concerned about the discoloration of his lower legs and peeling skin  While hospitalized he was placed in some compression wraps unfortunately these got red wet routine and EMS was called out to him  Assessment & Plan: Visit Diagnoses: No diagnosis found.  Plan: Today we will reapply his compression wraps we will use Internet as well as Dynaflex.  Silver cell to the plantar ulcer he will follow-up in 1 week for reevaluation  Follow-Up Instructions: Return in about 1 week (around 07/24/2022).   Ortho Exam  Patient is alert, oriented, no adenopathy, well-dressed, normal affect, normal respiratory effort. On examination bilateral lower extremities there is significant venous and lymphatic edema with dimpling of the skin there is no weeping the right is significantly larger than the left to the lateral right lower extremity there is a large open ulcer please see attached image.  After debridement fibrinous exudate tissue was removed this is filled in with 90% granulation tissue the plantar aspect of his foot he has had a chronic ulcer after an irrigation debridement and graft placement this wound is measuring 2 cm in diameter is 5 mm deep there is surrounding callus nonviable tissue which was debrided with a 10 blade knife after informed viable tissue.  Imaging: No results found.     Labs: Lab Results   Component Value Date   HGBA1C 8.3 (H) 06/20/2022   HGBA1C 10.9 (H) 04/14/2021   HGBA1C 10.5 (H) 02/07/2021   ESRSEDRATE 137 (H) 07/08/2022   ESRSEDRATE 107 (H) 06/27/2022   ESRSEDRATE 108 (H) 04/08/2021   CRP 14.1 (H) 07/08/2022   CRP 16.9 (H) 06/27/2022   CRP 27.2 (H) 04/08/2021   LABURIC 5.3 07/04/2022   LABURIC 7.0 01/14/2016   REPTSTATUS 06/24/2022 FINAL 06/22/2022   GRAMSTAIN NO WBC SEEN NO ORGANISMS SEEN  04/18/2021   CULT (A) 06/22/2022    <10,000 COLONIES/mL INSIGNIFICANT GROWTH Performed at [facility], [address] [place], [place] [address]    LABORGA PROTEUS MIRABILIS 04/18/2021     Lab Results  Component Value Date   ALBUMIN 2.1 (L) 07/10/2022   ALBUMIN 2.0 (L) 07/09/2022   ALBUMIN 1.8 (L) 07/03/2022    Lab Results  Component Value Date   MG 1.3 (L) 07/10/2022   MG 1.6 (L) 07/08/2022   MG 1.3 (L) 07/07/2022   No results found for: "VD25OH"  No results found for: "PREALBUMIN"    Latest Ref Rng & Units 07/10/2022    8:08 AM 07/09/2022    9:04 AM 07/08/2022    5:05 AM  CBC EXTENDED  WBC 4.0 - 10.5 K/uL 8.2  8.9  8.7   RBC 4.22 - 5.81 MIL/uL 3.03  2.99  2.89   Hemoglobin 13.0 - 17.0 g/dL 74.1  28.7  9.9   HCT 86.7 - 52.0 % 31.3  31.0  29.5   Platelets 150 - 400 K/uL 295  307  300   NEUT# 1.7 - 7.7 K/uL 5.1  5.9    Lymph# 0.7 - 4.0 K/uL 2.0  1.8       There is no height or weight on file to calculate BMI.  Orders:  No orders of the defined types were placed in this encounter.  No orders of the defined types were placed in this encounter.    Procedures: No procedures performed  Clinical Data: No additional findings.  ROS:  All other systems negative, except as noted in the HPI. Review of Systems  Objective: Vital Signs: There were no vitals taken for this visit.  Specialty Comments:  No specialty comments available.  PMFS History: Patient Active Problem List   Diagnosis Date Noted   Hyperkalemia 07/10/2022   Chronic  painful diabetic neuropathy (HCC) 07/10/2022   Metabolic acidosis, normal anion gap (NAG) 07/10/2022   Hypomagnesemia 07/10/2022   Charcot's arthropathy 06/28/2022   Leukocytosis    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)    Severe protein-calorie malnutrition (HCC)    Diabetic foot infection (HCC) 06/22/2022   Sepsis (HCC) 06/22/2022   Cellulitis in diabetic foot (HCC) 04/17/2021   Osteomyelitis (HCC) 04/14/2021   HTN (hypertension) 04/08/2021   HLD (hyperlipidemia) 04/08/2021   Alcohol abuse    DM (diabetes mellitus) (HCC) 01/27/2014   AKI (acute kidney injury) (HCC) 01/27/2014   Past Medical History:  Diagnosis Date   AKI (acute kidney injury) (HCC) 01/27/2014   Alcohol abuse    Anasarca    Bilateral leg edema 02/07/2021   Cellulitis    Cellulitis and abscess 01/26/2014   Cellulitis of right foot 04/17/2021   Dental abscess 01/26/2014   Diabetes mellitus type 2 in obese [facility])    Diabetic foot infection (HCC) 04/08/2021   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 2 diabetes mellitus, limited to breakdown of skin (HCC)    Facial cellulitis 01/26/2014   HTN (hypertension)    Hypokalemia 01/28/2014   Infected dental carries 01/26/2014   Leukocytosis 01/27/2014   Osteomyelitis (HCC) 04/14/2021   Right foot infection     History reviewed. No pertinent family history.  Past Surgical History:  Procedure Laterality Date   I & D EXTREMITY Right 04/18/2021   Procedure: IRRIGATION AND DEBRIDEMENT OF FOOT;  Surgeon: Nadara Mustard, MD;  Location: [facility] OR;  Service: Orthopedics;  Laterality: Right;   I & D EXTREMITY Right 04/20/2021   Procedure: EXCISIONAL DEBRIDEMENT RIGHT FOOT, APPLICATION OF SKIN GRAFT;  Surgeon: Nadara Mustard, MD;  Location: [facility] OR;  Service: Orthopedics;  Laterality: Right;   Social History   Occupational History   Occupation: Curator  Tobacco Use   Smoking status: Never   Smokeless tobacco: Never   Vaping Use   Vaping Use: Never used  Substance and Sexual Activity   Alcohol use: Yes    Alcohol/week: 2.0 standard drinks of alcohol    Types: 2 Cans of beer per week    Comment: Occasional   Drug use: No   Sexual activity: Not Currently    Partners: Female

## 2022-07-18 ENCOUNTER — Encounter (HOSPITAL_BASED_OUTPATIENT_CLINIC_OR_DEPARTMENT_OTHER): Payer: Medicaid Other | Admitting: General Surgery

## 2022-07-19 ENCOUNTER — Ambulatory Visit: Payer: Medicaid Other | Admitting: Family

## 2022-07-22 ENCOUNTER — Telehealth (INDEPENDENT_AMBULATORY_CARE_PROVIDER_SITE_OTHER): Payer: Self-pay | Admitting: Primary Care

## 2022-07-22 ENCOUNTER — Inpatient Hospital Stay: Payer: Self-pay | Admitting: Family Medicine

## 2022-07-22 NOTE — Telephone Encounter (Signed)
Copied from CRM [id number]. Topic: Appointment Scheduling - Scheduling Inquiry for Clinic >> Jul 22, 2022 11:51 AM Pincus Sanes wrote: Reason for CRM: Pt sister, [name]. [phone] has called back and states pt is out of insulin and wanting to know if he can get a soon HFU appt as has no insulin and just came out of hospital. FU at [phone]

## 2022-07-22 NOTE — Telephone Encounter (Signed)
Returned pt sister call. Per pt sister pt is currently out of all his medications and is needing a refill. Made pt sister aware that I will have to send message to provider because I am unable to refill rxs because we have not seen pt before. Pt sister states she understands. Pt stated she had one lats question and asked how do they go about getting a power wheelchair because pt has been falling out of his wheelchair about 3 times now. I made pt sister aware that she will need to research companies that does power wheelchairs and the company will have to send [place] the information that will need to be documented in chart. Pt sister states she understands and doesn't have any questions or concerns

## 2022-07-23 LAB — MISC LABCORP TEST (SEND OUT)
LabCorp test name: 182261
LabCorp test name: 182261
Labcorp test code: 182261
Labcorp test code: 182261

## 2022-07-24 ENCOUNTER — Encounter: Payer: Self-pay | Admitting: Family

## 2022-07-24 ENCOUNTER — Ambulatory Visit (INDEPENDENT_AMBULATORY_CARE_PROVIDER_SITE_OTHER): Payer: 59 | Admitting: Family

## 2022-07-24 DIAGNOSIS — R600 Localized edema: Secondary | ICD-10-CM

## 2022-07-24 DIAGNOSIS — L97511 Non-pressure chronic ulcer of other part of right foot limited to breakdown of skin: Secondary | ICD-10-CM

## 2022-07-24 DIAGNOSIS — Z945 Skin transplant status: Secondary | ICD-10-CM | POA: Diagnosis not present

## 2022-07-24 DIAGNOSIS — L98499 Non-pressure chronic ulcer of skin of other sites with unspecified severity: Secondary | ICD-10-CM | POA: Diagnosis not present

## 2022-07-24 DIAGNOSIS — I872 Venous insufficiency (chronic) (peripheral): Secondary | ICD-10-CM

## 2022-07-24 NOTE — Telephone Encounter (Signed)
Returned sister call and left a detailed vm making her aware that provider will not be able to refill any medications without being seen and if she has any questions or concerns to give [place] a call

## 2022-07-25 ENCOUNTER — Telehealth: Payer: Self-pay | Admitting: Orthopedic Surgery

## 2022-07-25 ENCOUNTER — Telehealth (INDEPENDENT_AMBULATORY_CARE_PROVIDER_SITE_OTHER): Payer: Self-pay | Admitting: Primary Care

## 2022-07-25 NOTE — Telephone Encounter (Signed)
Pt is calling to see if he has transportation for his 07/26/22 appt. Please advise CB- [phone]

## 2022-07-25 NOTE — Telephone Encounter (Signed)
Patient called. Would like to know if he could get a RX for a power wheelchair. His call back number is [phone]

## 2022-07-25 NOTE — Telephone Encounter (Signed)
Larence Penning. Was transportation set up for pt and pts still need to call for their transportation with medicaid

## 2022-07-26 ENCOUNTER — Encounter (HOSPITAL_BASED_OUTPATIENT_CLINIC_OR_DEPARTMENT_OTHER): Payer: Medicaid Other | Attending: General Surgery | Admitting: Internal Medicine

## 2022-07-26 NOTE — Progress Notes (Signed)
Office Visit Note   Patient: Jesse [name]           Date of Birth: [birth year]           MRN: [id number] Visit Date: 07/24/2022              Requested by: No referring provider defined for this encounter. PCP: Pcp, No  No chief complaint on file.     HPI: The patient is a 61 year old gentleman who presents today in follow-up he has been in it and Dynaflex compression wraps to bilateral lower extremities for the last 1 week.     Assessment & Plan: Visit Diagnoses: No diagnosis found.  Plan: We will place a compression stocking on the left lower extremity.  We will reapply it and Dynaflex to the right lower extremity he will follow-up in 1 week  Follow-Up Instructions: Return in about 1 week (around 07/31/2022).   Ortho Exam  Patient is alert, oriented, no adenopathy, well-dressed, normal affect, normal respiratory effort. On examination of bilateral lower extremities there is good wrinkling of the skin to the left lower extremity there is no open wound at this time there is no erythema no weeping to the right lower extremity there is some improvement in the edema there is no erythema or warmth no weeping. There has been marked improvement in the ulcer to his right calf this has filled in about 1 cm circumferentially.  There is some dried serous drainage there is no fibrinous exudative tissue in the wound bed.  To the plantar aspect of his foot he continues with a 15 mm in diameter wound this is 2 mm deep this is filled in with granulation after debridement.  No erythema warmth no odor  Imaging: No results found.     Labs: Lab Results  Component Value Date   HGBA1C 8.3 (H) 06/20/2022   HGBA1C 10.9 (H) 04/14/2021   HGBA1C 10.5 (H) 02/07/2021   ESRSEDRATE 137 (H) 07/08/2022   ESRSEDRATE 107 (H) 06/27/2022   ESRSEDRATE 108 (H) 04/08/2021   CRP 14.1 (H) 07/08/2022   CRP 16.9 (H) 06/27/2022   CRP 27.2 (H) 04/08/2021   LABURIC 5.3 07/04/2022   LABURIC 7.0 01/14/2016    REPTSTATUS 06/24/2022 FINAL 06/22/2022   GRAMSTAIN NO WBC SEEN NO ORGANISMS SEEN  04/18/2021   CULT (A) 06/22/2022    <10,000 COLONIES/mL INSIGNIFICANT GROWTH Performed at [facility], [address], [place], [place] [address]    LABORGA PROTEUS MIRABILIS 04/18/2021     Lab Results  Component Value Date   ALBUMIN 2.1 (L) 07/10/2022   ALBUMIN 2.0 (L) 07/09/2022   ALBUMIN 1.8 (L) 07/03/2022    Lab Results  Component Value Date   MG 1.3 (L) 07/10/2022   MG 1.6 (L) 07/08/2022   MG 1.3 (L) 07/07/2022   No results found for: "VD25OH"  No results found for: "PREALBUMIN"    Latest Ref Rng & Units 07/10/2022    8:08 AM 07/09/2022    9:04 AM 07/08/2022    5:05 AM  CBC EXTENDED  WBC 4.0 - 10.5 K/uL 8.2  8.9  8.7   RBC 4.22 - 5.81 MIL/uL 3.03  2.99  2.89   Hemoglobin 13.0 - 17.0 g/dL 70.3  50.0  9.9   HCT 93.8 - 52.0 % 31.3  31.0  29.5   Platelets 150 - 400 K/uL 295  307  300   NEUT# 1.7 - 7.7 K/uL 5.1  5.9    Lymph# 0.7 -  4.0 K/uL 2.0  1.8       There is no height or weight on file to calculate BMI.  Orders:  No orders of the defined types were placed in this encounter.  No orders of the defined types were placed in this encounter.    Procedures: No procedures performed  Clinical Data: No additional findings.  ROS:  All other systems negative, except as noted in the HPI. Review of Systems  Objective: Vital Signs: There were no vitals taken for this visit.  Specialty Comments:  No specialty comments available.  PMFS History: Patient Active Problem List   Diagnosis Date Noted   Hyperkalemia 07/10/2022   Chronic painful diabetic neuropathy (HCC) 07/10/2022   Metabolic acidosis, normal anion gap (NAG) 07/10/2022   Hypomagnesemia 07/10/2022   Charcot's arthropathy 06/28/2022   Leukocytosis    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)    Severe protein-calorie malnutrition (HCC)    Diabetic  foot infection (HCC) 06/22/2022   Sepsis (HCC) 06/22/2022   Cellulitis in diabetic foot (HCC) 04/17/2021   Osteomyelitis (HCC) 04/14/2021   HTN (hypertension) 04/08/2021   HLD (hyperlipidemia) 04/08/2021   Alcohol abuse    DM (diabetes mellitus) (HCC) 01/27/2014   AKI (acute kidney injury) (HCC) 01/27/2014   Past Medical History:  Diagnosis Date   AKI (acute kidney injury) (HCC) 01/27/2014   Alcohol abuse    Anasarca    Bilateral leg edema 02/07/2021   Cellulitis    Cellulitis and abscess 01/26/2014   Cellulitis of right foot 04/17/2021   Dental abscess 01/26/2014   Diabetes mellitus type 2 in obese [facility])    Diabetic foot infection (HCC) 04/08/2021   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 2 diabetes mellitus, limited to breakdown of skin (HCC)    Facial cellulitis 01/26/2014   HTN (hypertension)    Hypokalemia 01/28/2014   Infected dental carries 01/26/2014   Leukocytosis 01/27/2014   Osteomyelitis (HCC) 04/14/2021   Right foot infection     History reviewed. No pertinent family history.  Past Surgical History:  Procedure Laterality Date   I & D EXTREMITY Right 04/18/2021   Procedure: IRRIGATION AND DEBRIDEMENT OF FOOT;  Surgeon: Nadara Mustard, MD;  Location: [facility] OR;  Service: Orthopedics;  Laterality: Right;   I & D EXTREMITY Right 04/20/2021   Procedure: EXCISIONAL DEBRIDEMENT RIGHT FOOT, APPLICATION OF SKIN GRAFT;  Surgeon: Nadara Mustard, MD;  Location: [facility] OR;  Service: Orthopedics;  Laterality: Right;   Social History   Occupational History   Occupation: Curator  Tobacco Use   Smoking status: Never   Smokeless tobacco: Never  Vaping Use   Vaping Use: Never used  Substance and Sexual Activity   Alcohol use: Yes    Alcohol/week: 2.0 standard drinks of alcohol    Types: 2 Cans of beer per week    Comment: Occasional   Drug use: No   Sexual activity: Not Currently    Partners: Female

## 2022-07-26 NOTE — Telephone Encounter (Signed)
Contacted pt to sister to give information in regards to transportation. Pt sister doesn't have any questions or concerns

## 2022-07-29 ENCOUNTER — Encounter: Payer: Self-pay | Admitting: Orthopedic Surgery

## 2022-07-29 ENCOUNTER — Ambulatory Visit (INDEPENDENT_AMBULATORY_CARE_PROVIDER_SITE_OTHER): Payer: Medicaid Other | Admitting: Orthopedic Surgery

## 2022-07-29 DIAGNOSIS — L98499 Non-pressure chronic ulcer of skin of other sites with unspecified severity: Secondary | ICD-10-CM

## 2022-07-29 DIAGNOSIS — I872 Venous insufficiency (chronic) (peripheral): Secondary | ICD-10-CM | POA: Diagnosis not present

## 2022-07-29 LAB — CULTURE, BLOOD (ROUTINE X 2)

## 2022-07-29 NOTE — Progress Notes (Signed)
Office Visit Note   Patient: Jesse [name]           Date of Birth: 1961-06-04           MRN: [id number] Visit Date: 07/29/2022              Requested by: No referring provider defined for this encounter. PCP: Pcp, No  Chief Complaint  Patient presents with   Left Leg - Follow-up   Right Leg - Follow-up      HPI: Patient is a 61 year old gentleman with chronic venous insufficiency ulcers right lower extremity.  Patient states that he took the compression wrap off and he thought it was too tight.  Patient has been ambulating in his wheelchair with his leg dependent at all times.  Assessment & Plan: Visit Diagnoses:  1. Ulcer of extremity due to chronic venous insufficiency (HCC)     Plan: Discussed the importance evaluation for muscle contracture.  Discussed the importance of exercise and elevation.  Patient will start with Dial soap cleansing and an Ace wrap to help with compression change this daily.  Follow-Up Instructions: Return in about 2 weeks (around 08/12/2022).   Ortho Exam  Patient is alert, oriented, no adenopathy, well-dressed, normal affect, normal respiratory effort. Examination patient has increased pain with swelling of the right lower extremity his calf is 58 cm in circumference.  The ulcer of the calf is 4 cm in diameter with granulation tissue at the base there is no cellulitis no signs of infection.  Imaging: No results found. No images are attached to the encounter.  Labs: Lab Results  Component Value Date   HGBA1C 8.3 (H) 06/20/2022   HGBA1C 10.9 (H) 04/14/2021   HGBA1C 10.5 (H) 02/07/2021   ESRSEDRATE 137 (H) 07/08/2022   ESRSEDRATE 107 (H) 06/27/2022   ESRSEDRATE 108 (H) 04/08/2021   CRP 14.1 (H) 07/08/2022   CRP 16.9 (H) 06/27/2022   CRP 27.2 (H) 04/08/2021   LABURIC 5.3 07/04/2022   LABURIC 7.0 01/14/2016   REPTSTATUS 06/24/2022 FINAL 06/22/2022   GRAMSTAIN NO WBC SEEN NO ORGANISMS SEEN  04/18/2021   CULT (A) 06/22/2022    <10,000  COLONIES/mL INSIGNIFICANT GROWTH Performed at [facility], [address] [place], [place] [address]    LABORGA PROTEUS MIRABILIS 04/18/2021     Lab Results  Component Value Date   ALBUMIN 2.1 (L) 07/10/2022   ALBUMIN 2.0 (L) 07/09/2022   ALBUMIN 1.8 (L) 07/03/2022    Lab Results  Component Value Date   MG 1.3 (L) 07/10/2022   MG 1.6 (L) 07/08/2022   MG 1.3 (L) 07/07/2022   No results found for: "VD25OH"  No results found for: "PREALBUMIN"    Latest Ref Rng & Units 07/10/2022    8:08 AM 07/09/2022    9:04 AM 07/08/2022    5:05 AM  CBC EXTENDED  WBC 4.0 - 10.5 K/uL 8.2  8.9  8.7   RBC 4.22 - 5.81 MIL/uL 3.03  2.99  2.89   Hemoglobin 13.0 - 17.0 g/dL 17.5  10.2  9.9   HCT 58.5 - 52.0 % 31.3  31.0  29.5   Platelets 150 - 400 K/uL 295  307  300   NEUT# 1.7 - 7.7 K/uL 5.1  5.9    Lymph# 0.7 - 4.0 K/uL 2.0  1.8       There is no height or weight on file to calculate BMI.  Orders:  No orders of the defined types were placed  in this encounter.  No orders of the defined types were placed in this encounter.    Procedures: No procedures performed  Clinical Data: No additional findings.  ROS:  All other systems negative, except as noted in the HPI. Review of Systems  Objective: Vital Signs: There were no vitals taken for this visit.  Specialty Comments:  No specialty comments available.  PMFS History: Patient Active Problem List   Diagnosis Date Noted   Hyperkalemia 07/10/2022   Chronic painful diabetic neuropathy ([place]) 91/63/8466   Metabolic acidosis, normal anion gap (NAG) 07/10/2022   Hypomagnesemia 07/10/2022   Charcot's arthropathy 06/28/2022   Leukocytosis    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)    Severe protein-calorie malnutrition ([place])    Diabetic foot infection ([place]) 06/22/2022   Sepsis ([place]) 06/22/2022   Cellulitis in diabetic foot ([place]) 04/17/2021   Osteomyelitis ([place])  04/14/2021   HTN (hypertension) 04/08/2021   HLD (hyperlipidemia) 04/08/2021   Alcohol abuse    DM (diabetes mellitus) ([place]) 01/27/2014   AKI (acute kidney injury) ([place]) 01/27/2014   Past Medical History:  Diagnosis Date   AKI (acute kidney injury) ([place]) 01/27/2014   Alcohol abuse    Anasarca    Bilateral leg edema 02/07/2021   Cellulitis    Cellulitis and abscess 01/26/2014   Cellulitis of right foot 04/17/2021   Dental abscess 01/26/2014   Diabetes mellitus type 2 in obese [facility])    Diabetic foot infection ([place]) 04/08/2021   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 2 diabetes mellitus, limited to breakdown of skin ([place])    Facial cellulitis 01/26/2014   HTN (hypertension)    Hypokalemia 01/28/2014   Infected dental carries 01/26/2014   Leukocytosis 01/27/2014   Osteomyelitis ([place]) 04/14/2021   Right foot infection     History reviewed. No pertinent family history.  Past Surgical History:  Procedure Laterality Date   I & D EXTREMITY Right 04/18/2021   Procedure: IRRIGATION AND DEBRIDEMENT OF FOOT;  Surgeon: Newt Minion, MD;  Location: [place];  Service: Orthopedics;  Laterality: Right;   I & D EXTREMITY Right 04/20/2021   Procedure: EXCISIONAL DEBRIDEMENT RIGHT FOOT, APPLICATION OF SKIN GRAFT;  Surgeon: Newt Minion, MD;  Location: [place];  Service: Orthopedics;  Laterality: Right;   Social History   Occupational History   Occupation: Dealer  Tobacco Use   Smoking status: Never   Smokeless tobacco: Never  Vaping Use   Vaping Use: Never used  Substance and Sexual Activity   Alcohol use: Yes    Alcohol/week: 2.0 standard drinks of alcohol    Types: 2 Cans of beer per week    Comment: Occasional   Drug use: No   Sexual activity: Not Currently    Partners: Female

## 2022-07-29 NOTE — Telephone Encounter (Signed)
Per medicaid guidelines, they will not cover a power wheelchair as he does not have the required disabilities(spinal issues, shoulder issues) and he is able to self propel in a regular wheelchair.

## 2022-07-29 NOTE — Telephone Encounter (Signed)
Were you working on this with this pt last week? Did he pick a location?

## 2022-07-29 NOTE — Telephone Encounter (Signed)
I gave him info to call [place] mobility and/or [employer], but told him he needed to contact them first so they can send [place] all the paperwork to fill out including the Rx.

## 2022-07-29 NOTE — Telephone Encounter (Signed)
To add on for qualified diagnosis: amputations, paralysis, cerebral palsy, MD and MS.

## 2022-07-29 NOTE — Telephone Encounter (Signed)
Pt.notified

## 2022-07-30 ENCOUNTER — Telehealth (HOSPITAL_BASED_OUTPATIENT_CLINIC_OR_DEPARTMENT_OTHER): Payer: Self-pay | Admitting: *Deleted

## 2022-07-30 NOTE — Telephone Encounter (Signed)
Post ED Visit - Positive Culture Follow-up  Culture report reviewed by antimicrobial stewardship pharmacist: [place] Team []  Elenor Quinones, Pharm.D. []  Heide Guile, Pharm.D., BCPS AQ-ID []  Parks Neptune, Pharm.D., BCPS []  Alycia Rossetti, Pharm.D., BCPS []  Vincent, Pharm.D., BCPS, AAHIVP []  Legrand Como, Pharm.D., BCPS, AAHIVP []  Salome Arnt, PharmD, BCPS []  Johnnette Gourd, PharmD, BCPS []  Hughes Better, PharmD, BCPS []  Leeroy Cha, PharmD []  Laqueta Linden, PharmD, BCPS []  Albertina Parr, PharmD  [place] Team []  Leodis Sias, PharmD []  Lindell Spar, PharmD []  Royetta Asal, PharmD []  Graylin Shiver, Rph []  Rema Fendt) Glennon Mac, PharmD []  Arlyn Dunning, PharmD []  Netta Cedars, PharmD []  Dia Sitter, PharmD []  Leone Haven, PharmD []  Gretta Arab, PharmD []  Theodis Shove, PharmD []  Peggyann Juba, PharmD []  Reuel Boom, PharmD   Positive blood culture Treated with Ciprofloxacin and Doxycycline, organism sensitive to the same and no further patient follow-up is required at this time. Luisa Hart, Pharm D  Ardeen Fillers 07/30/2022, 10:47 AM

## 2022-07-31 ENCOUNTER — Ambulatory Visit: Payer: Medicaid Other | Admitting: Family

## 2022-08-07 ENCOUNTER — Ambulatory Visit (INDEPENDENT_AMBULATORY_CARE_PROVIDER_SITE_OTHER): Payer: Medicaid Other | Admitting: Family

## 2022-08-07 DIAGNOSIS — L98499 Non-pressure chronic ulcer of skin of other sites with unspecified severity: Secondary | ICD-10-CM | POA: Diagnosis not present

## 2022-08-07 DIAGNOSIS — I872 Venous insufficiency (chronic) (peripheral): Secondary | ICD-10-CM

## 2022-08-08 ENCOUNTER — Inpatient Hospital Stay (INDEPENDENT_AMBULATORY_CARE_PROVIDER_SITE_OTHER): Payer: Medicaid Other | Admitting: Primary Care

## 2022-08-15 ENCOUNTER — Inpatient Hospital Stay (INDEPENDENT_AMBULATORY_CARE_PROVIDER_SITE_OTHER): Payer: Medicaid Other | Admitting: Primary Care

## 2022-08-21 ENCOUNTER — Ambulatory Visit: Payer: Medicaid Other | Admitting: Family

## 2022-08-22 ENCOUNTER — Encounter: Payer: Self-pay | Admitting: Orthopedic Surgery

## 2022-08-22 ENCOUNTER — Ambulatory Visit (INDEPENDENT_AMBULATORY_CARE_PROVIDER_SITE_OTHER): Payer: 59 | Admitting: Orthopedic Surgery

## 2022-08-22 DIAGNOSIS — I872 Venous insufficiency (chronic) (peripheral): Secondary | ICD-10-CM

## 2022-08-22 DIAGNOSIS — Z945 Skin transplant status: Secondary | ICD-10-CM | POA: Diagnosis not present

## 2022-08-22 DIAGNOSIS — I890 Lymphedema, not elsewhere classified: Secondary | ICD-10-CM

## 2022-08-22 DIAGNOSIS — L98499 Non-pressure chronic ulcer of skin of other sites with unspecified severity: Secondary | ICD-10-CM | POA: Diagnosis not present

## 2022-08-22 DIAGNOSIS — R600 Localized edema: Secondary | ICD-10-CM | POA: Diagnosis not present

## 2022-08-22 DIAGNOSIS — L97511 Non-pressure chronic ulcer of other part of right foot limited to breakdown of skin: Secondary | ICD-10-CM

## 2022-08-22 NOTE — Progress Notes (Signed)
Office Visit Note   Patient: Jesse [name]           Date of Birth: [birth year]           MRN: [id number] Visit Date: 08/22/2022              Requested by: No referring provider defined for this encounter. PCP: Pcp, No  Chief Complaint  Patient presents with   Right Leg - Wound Check   Left Leg - Wound Check      HPI: Patient is a 61 year old gentleman with venous and lymphatic insufficiency both legs with massive swelling of the right leg with a venous ulcer anterior laterally as well as persistent ulceration on the plantar aspect of the right foot.  Assessment & Plan: Visit Diagnoses:  1. Ulcer of extremity due to chronic venous insufficiency (HCC)   2. History of skin graft   3. Bilateral leg edema   4. Non-pressure chronic ulcer of other part of right foot limited to breakdown of skin ([place])   5. Lymphedema     Plan: We will apply a 3 layer compression wrap to the right lower extremity follow-up for nurse visit dressing change in 1 week follow-up for Dr. Visit in 2 weeks.  Discussed the importance of elevation and exercise.  Follow-Up Instructions: Return in about 1 week (around 08/29/2022) for Follow-up for nursing visit in 1 week and Dr. Follow-up in 2 weeks.   Ortho Exam  Patient is alert, oriented, no adenopathy, well-dressed, normal affect, normal respiratory effort. Examination patient has a persistent ulceration on the plantar aspect of the right foot that is 1 cm diameter 2 mm deep.  There is healthy granulation tissue.  Patient has massive venous and lymphatic insufficiency worse on the right than the left right calf is 57 cm in circumference.  The ulcer on the medial right calf is 3 cm in diameter 1 mm deep.  Cellulitis.  Imaging: No results found. No images are attached to the encounter.  Labs: Lab Results  Component Value Date   HGBA1C 8.3 (H) 06/20/2022   HGBA1C 10.9 (H) 04/14/2021   HGBA1C 10.5 (H) 02/07/2021   ESRSEDRATE 137 (H) 07/08/2022    ESRSEDRATE 107 (H) 06/27/2022   ESRSEDRATE 108 (H) 04/08/2021   CRP 14.1 (H) 07/08/2022   CRP 16.9 (H) 06/27/2022   CRP 27.2 (H) 04/08/2021   LABURIC 5.3 07/04/2022   LABURIC 7.0 01/14/2016   REPTSTATUS 06/24/2022 FINAL 06/22/2022   GRAMSTAIN NO WBC SEEN NO ORGANISMS SEEN  04/18/2021   CULT (A) 06/22/2022    <10,000 COLONIES/mL INSIGNIFICANT GROWTH Performed at [facility], [place] [address] [place], [place] [address]    LABORGA PROTEUS MIRABILIS 04/18/2021     Lab Results  Component Value Date   ALBUMIN 2.1 (L) 07/10/2022   ALBUMIN 2.0 (L) 07/09/2022   ALBUMIN 1.8 (L) 07/03/2022    Lab Results  Component Value Date   MG 1.3 (L) 07/10/2022   MG 1.6 (L) 07/08/2022   MG 1.3 (L) 07/07/2022   No results found for: "VD25OH"  No results found for: "PREALBUMIN"    Latest Ref Rng & Units 07/10/2022    8:08 AM 07/09/2022    9:04 AM 07/08/2022    5:05 AM  CBC EXTENDED  WBC 4.0 - 10.5 K/uL 8.2  8.9  8.7   RBC 4.22 - 5.81 MIL/uL 3.03  2.99  2.89   Hemoglobin 13.0 - 17.0 g/dL 10.5  10.1  9.9  HCT 39.0 - 52.0 % 31.3  31.0  29.5   Platelets 150 - 400 K/uL 295  307  300   NEUT# 1.7 - 7.7 K/uL 5.1  5.9    Lymph# 0.7 - 4.0 K/uL 2.0  1.8       There is no height or weight on file to calculate BMI.  Orders:  No orders of the defined types were placed in this encounter.  No orders of the defined types were placed in this encounter.    Procedures: No procedures performed  Clinical Data: No additional findings.  ROS:  All other systems negative, except as noted in the HPI. Review of Systems  Objective: Vital Signs: There were no vitals taken for this visit.  Specialty Comments:  No specialty comments available.  PMFS History: Patient Active Problem List   Diagnosis Date Noted   Hyperkalemia 07/10/2022   Chronic painful diabetic neuropathy (HCC) 07/10/2022   Metabolic acidosis, normal anion gap (NAG) 07/10/2022   Hypomagnesemia 07/10/2022   Charcot's  arthropathy 06/28/2022   Leukocytosis    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)    Severe protein-calorie malnutrition (HCC)    Diabetic foot infection (HCC) 06/22/2022   Sepsis (HCC) 06/22/2022   Cellulitis in diabetic foot (HCC) 04/17/2021   Osteomyelitis (HCC) 04/14/2021   HTN (hypertension) 04/08/2021   HLD (hyperlipidemia) 04/08/2021   Alcohol abuse    DM (diabetes mellitus) (HCC) 01/27/2014   AKI (acute kidney injury) (HCC) 01/27/2014   Past Medical History:  Diagnosis Date   AKI (acute kidney injury) (HCC) 01/27/2014   Alcohol abuse    Anasarca    Bilateral leg edema 02/07/2021   Cellulitis    Cellulitis and abscess 01/26/2014   Cellulitis of right foot 04/17/2021   Dental abscess 01/26/2014   Diabetes mellitus type 2 in obese [facility])    Diabetic foot infection (HCC) 04/08/2021   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 2 diabetes mellitus, limited to breakdown of skin (HCC)    Facial cellulitis 01/26/2014   HTN (hypertension)    Hypokalemia 01/28/2014   Infected dental carries 01/26/2014   Leukocytosis 01/27/2014   Osteomyelitis (HCC) 04/14/2021   Right foot infection     History reviewed. No pertinent family history.  Past Surgical History:  Procedure Laterality Date   I & D EXTREMITY Right 04/18/2021   Procedure: IRRIGATION AND DEBRIDEMENT OF FOOT;  Surgeon: Nadara Mustard, MD;  [facility];  Service: Orthopedics;  Laterality: Right;   I & D EXTREMITY Right 04/20/2021   Procedure: EXCISIONAL DEBRIDEMENT RIGHT FOOT, APPLICATION OF SKIN GRAFT;  Surgeon: Nadara Mustard, MD;  Location: [facility] OR;  Service: Orthopedics;  Laterality: Right;   Social History   Occupational History   Occupation: Curator  Tobacco Use   Smoking status: Never   Smokeless tobacco: Never  Vaping Use   Vaping Use: Never used  Substance and Sexual Activity   Alcohol use: Yes    Alcohol/week: 2.0 standard drinks of alcohol     Types: 2 Cans of beer per week    Comment: Occasional   Drug use: No   Sexual activity: Not Currently    Partners: Female

## 2022-08-26 ENCOUNTER — Telehealth (INDEPENDENT_AMBULATORY_CARE_PROVIDER_SITE_OTHER): Payer: Self-pay | Admitting: Primary Care

## 2022-08-26 NOTE — Telephone Encounter (Signed)
I returned the call to patient [phone] and left a voicemail message requesting a call back.   We cannot order any equipment until he is seen by the provider and there is no guarantee that his insurance will cover it.  He should contact Dr Jess Barters office with this request.  He can also go to [employer] [facility] [place], [place] to see  if they have any crutches available.  They have DME that has been donated and there is no cost for the equipment. What they have available varies but there is a good chance they have crutches.  The phone number if he wants to call [phone]
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
## 2022-08-28 ENCOUNTER — Ambulatory Visit: Payer: Medicaid Other

## 2022-08-28 ENCOUNTER — Telehealth: Payer: Self-pay | Admitting: Orthopedic Surgery

## 2022-08-28 DIAGNOSIS — I890 Lymphedema, not elsewhere classified: Secondary | ICD-10-CM

## 2022-08-28 NOTE — Telephone Encounter (Signed)
I called pt and made a nurse only visit for this afternoon at 2:30

## 2022-08-28 NOTE — Telephone Encounter (Signed)
Pt states that he forgot his appointment this am, wants someone to call him.Marland Kitchen

## 2022-08-28 NOTE — Progress Notes (Signed)
Patient came into the office today for his weekly compression wrap change. The patient has an appt earlier this morning but cancelled and I was able to reschedule him to this afternoon. The patient advised that when he missed his appt this morning he removed the dressing himself at home. The pt applied an as bandage to the leg. The right leg is swollen, no weeping, no ulceration but there is also no wrinkling to the skin which you would expect to see after wearing a compression dressing for a week and only removing several hours ago. The patient states that he does not elevate his foot while he is at home and so his leg is down and dependant all day long. A new Profore compression dressing was applied and the patient was advised to try and elevate his leg throughout the day and to leave the dressing intact until his next appt. He was advised to call with any questions or concerns and scheduled to see Junie Panning next week.    Shalom Ware, [place], [employer]

## 2022-09-04 ENCOUNTER — Ambulatory Visit: Payer: Medicaid Other | Admitting: Family

## 2022-09-10 ENCOUNTER — Inpatient Hospital Stay (INDEPENDENT_AMBULATORY_CARE_PROVIDER_SITE_OTHER): Payer: Medicaid Other | Admitting: Primary Care

## 2022-09-11 ENCOUNTER — Ambulatory Visit: Payer: Medicaid Other | Admitting: Family

## 2022-09-11 ENCOUNTER — Encounter: Payer: Self-pay | Admitting: Family

## 2022-09-11 NOTE — Progress Notes (Signed)
Office Visit Note   Patient: Jesse [name]           Date of Birth: 04-04-1961           MRN: [id number] Visit Date: 08/07/2022              Requested by: No referring provider defined for this encounter. PCP: Pcp, No  Chief Complaint  Patient presents with   Right Leg - Follow-up   Left Leg - Follow-up      HPI: Patient is a 61 year old gentleman with chronic venous insufficiency ulcers right lower extremity.    Should he presents today in follow-up he is pleased his ulcer has reduced in size to the right leg.  Does have significant swelling of his legs has been using Ace wrap's  Assessment & Plan: Visit Diagnoses:  No diagnosis found.   Plan: Discussed the importance evaluation for muscle contracture.  Discussed the importance of exercise and elevation.  Patient will continue with Dial soap cleansing and an Ace wrap to help with compression change this daily.  Follow-Up Instructions: No follow-ups on file.   Ortho Exam  Patient is alert, oriented, no adenopathy, well-dressed, normal affect, normal respiratory effort. On examination of the right lower extremity he does have significant edema the ulcer continues to improve this is now 35 mm in diameter.  There is granulation at the base there is no surrounding erythema no cellulitis no warmth no purulence   Imaging: No results found. No images are attached to the encounter.  Labs: Lab Results  Component Value Date   HGBA1C 8.3 (H) 06/20/2022   HGBA1C 10.9 (H) 04/14/2021   HGBA1C 10.5 (H) 02/07/2021   ESRSEDRATE 137 (H) 07/08/2022   ESRSEDRATE 107 (H) 06/27/2022   ESRSEDRATE 108 (H) 04/08/2021   CRP 14.1 (H) 07/08/2022   CRP 16.9 (H) 06/27/2022   CRP 27.2 (H) 04/08/2021   LABURIC 5.3 07/04/2022   LABURIC 7.0 01/14/2016   REPTSTATUS 06/24/2022 FINAL 06/22/2022   GRAMSTAIN NO WBC SEEN NO ORGANISMS SEEN  04/18/2021   CULT (A) 06/22/2022    <10,000 COLONIES/mL INSIGNIFICANT GROWTH Performed at [facility], [place] [address], [place], [place] [address]    LABORGA PROTEUS MIRABILIS 04/18/2021     Lab Results  Component Value Date   ALBUMIN 2.1 (L) 07/10/2022   ALBUMIN 2.0 (L) 07/09/2022   ALBUMIN 1.8 (L) 07/03/2022    Lab Results  Component Value Date   MG 1.3 (L) 07/10/2022   MG 1.6 (L) 07/08/2022   MG 1.3 (L) 07/07/2022   No results found for: "VD25OH"  No results found for: "PREALBUMIN"    Latest Ref Rng & Units 07/10/2022    8:08 AM 07/09/2022    9:04 AM 07/08/2022    5:05 AM  CBC EXTENDED  WBC 4.0 - 10.5 K/uL 8.2  8.9  8.7   RBC 4.22 - 5.81 MIL/uL 3.03  2.99  2.89   Hemoglobin 13.0 - 17.0 g/dL 10.5  10.1  9.9   HCT 39.0 - 52.0 % 31.3  31.0  29.5   Platelets 150 - 400 K/uL 295  307  300   NEUT# 1.7 - 7.7 K/uL 5.1  5.9    Lymph# 0.7 - 4.0 K/uL 2.0  1.8       There is no height or weight on file to calculate BMI.  Orders:  No orders of the defined types were placed in this encounter.  No orders of the defined types  were placed in this encounter.    Procedures: No procedures performed  Clinical Data: No additional findings.  ROS:  All other systems negative, except as noted in the HPI. Review of Systems  Objective: Vital Signs: There were no vitals taken for this visit.  Specialty Comments:  No specialty comments available.  PMFS History: Patient Active Problem List   Diagnosis Date Noted   Hyperkalemia 07/10/2022   Chronic painful diabetic neuropathy ([place]) [id number]   Metabolic acidosis, normal anion gap (NAG) 07/10/2022   Hypomagnesemia 07/10/2022   Charcot's arthropathy 06/28/2022   Leukocytosis    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)    Severe protein-calorie malnutrition ([place])    Diabetic foot infection ([place]) 06/22/2022   Sepsis ([place]) 06/22/2022   Cellulitis in diabetic foot ([place]) 04/17/2021   Osteomyelitis ([place]) 04/14/2021   HTN (hypertension) 04/08/2021   HLD  (hyperlipidemia) 04/08/2021   Alcohol abuse    DM (diabetes mellitus) ([place]) 01/27/2014   AKI (acute kidney injury) ([place]) 01/27/2014   Past Medical History:  Diagnosis Date   AKI (acute kidney injury) ([place]) 01/27/2014   Alcohol abuse    Anasarca    Bilateral leg edema 02/07/2021   Cellulitis    Cellulitis and abscess 01/26/2014   Cellulitis of right foot 04/17/2021   Dental abscess 01/26/2014   Diabetes mellitus type 2 in obese [facility])    Diabetic foot infection ([place]) 04/08/2021   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 2 diabetes mellitus, limited to breakdown of skin ([place])    Facial cellulitis 01/26/2014   HTN (hypertension)    Hypokalemia 01/28/2014   Infected dental carries 01/26/2014   Leukocytosis 01/27/2014   Osteomyelitis ([place]) 04/14/2021   Right foot infection     No family history on file.  Past Surgical History:  Procedure Laterality Date   I & D EXTREMITY Right 04/18/2021   Procedure: IRRIGATION AND DEBRIDEMENT OF FOOT;  Surgeon: Newt Minion, MD;  Location: [place];  Service: Orthopedics;  Laterality: Right;   I & D EXTREMITY Right 04/20/2021   Procedure: EXCISIONAL DEBRIDEMENT RIGHT FOOT, APPLICATION OF SKIN GRAFT;  Surgeon: Newt Minion, MD;  Location: [place];  Service: Orthopedics;  Laterality: Right;   Social History   Occupational History   Occupation: Dealer  Tobacco Use   Smoking status: Never   Smokeless tobacco: Never  Vaping Use   Vaping Use: Never used  Substance and Sexual Activity   Alcohol use: Yes    Alcohol/week: 2.0 standard drinks of alcohol    Types: 2 Cans of beer per week    Comment: Occasional   Drug use: No   Sexual activity: Not Currently    Partners: Female

## 2022-09-12 ENCOUNTER — Ambulatory Visit: Payer: Medicaid Other | Admitting: Family

## 2022-09-18 ENCOUNTER — Ambulatory Visit (INDEPENDENT_AMBULATORY_CARE_PROVIDER_SITE_OTHER): Payer: Medicaid Other | Admitting: Family

## 2022-09-18 ENCOUNTER — Ambulatory Visit (INDEPENDENT_AMBULATORY_CARE_PROVIDER_SITE_OTHER): Payer: Medicaid Other

## 2022-09-18 ENCOUNTER — Encounter: Payer: Self-pay | Admitting: Family

## 2022-09-18 DIAGNOSIS — L98499 Non-pressure chronic ulcer of skin of other sites with unspecified severity: Secondary | ICD-10-CM

## 2022-09-18 DIAGNOSIS — L97511 Non-pressure chronic ulcer of other part of right foot limited to breakdown of skin: Secondary | ICD-10-CM | POA: Diagnosis not present

## 2022-09-18 DIAGNOSIS — I890 Lymphedema, not elsewhere classified: Secondary | ICD-10-CM | POA: Diagnosis not present

## 2022-09-18 DIAGNOSIS — M1711 Unilateral primary osteoarthritis, right knee: Secondary | ICD-10-CM

## 2022-09-18 DIAGNOSIS — M25561 Pain in right knee: Secondary | ICD-10-CM

## 2022-09-18 DIAGNOSIS — I872 Venous insufficiency (chronic) (peripheral): Secondary | ICD-10-CM

## 2022-09-18 MED ORDER — LIDOCAINE HCL 1 % IJ SOLN
5.0000 mL | INTRAMUSCULAR | Status: AC | PRN
  Administered 2022-09-18: 5 mL

## 2022-09-18 MED ORDER — METHYLPREDNISOLONE ACETATE 40 MG/ML IJ SUSP
40.0000 mg | INTRAMUSCULAR | Status: AC | PRN
  Administered 2022-09-18: 40 mg via INTRA_ARTICULAR

## 2022-09-18 NOTE — Progress Notes (Signed)
Office Visit Note   Patient: Jesse [name]           Date of Birth: [birth year]           MRN: [id number] Visit Date: 09/18/2022              Requested by: No referring provider defined for this encounter. PCP: Pcp, No  Chief Complaint  Patient presents with   Right Leg - Edema, Follow-up   Left Leg - Edema, Follow-up      HPI: The patient is a 61 year old gentleman who is seen today for chief complaint of right knee pain he states that he has had difficulty bending his knee and straightening it all the way difficulty standing due to loss of flexion.  He states that he has been cleaning his home and when he does this he typically uses his wheelchair and has difficulty ambulating using his wheelchair as a walker.  No recent injury no fall no aggravating factor he can think of  He also has chronic venous insufficiency and lymphedema the right is significantly worse than the left.  He unfortunately has had chronic ulcers he has a plantar ulcer that he has had for many months now.  Has had surgical intervention grafting of the plantar aspect of his right foot.  Did have an MRI of the right foot in August of this year which was revealing for:  IMPRESSION: 1. 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 2. Patchy areas of marrow edema within the midfoot without focal erosion which could represent reactive osteitis or stress-related changes. 3. Plantar soft tissue ulceration underlying the midfoot. 4. Marked dorsal subcutaneous edema and likely cellulitis. No organized fluid collections. 5. Findings of diffuse myositis and/or denervation  Assessment & Plan: Visit Diagnoses: No diagnosis found.  Plan: Concern for significant edema to the right lower extremity patient has been unable to be compliant with compression stockings as well as office applied serial compression garments.  His recent large  ulcer to the right lower extremity is now closed however he has new ulcers which are scattered over the right lower extremity significant edema MRI in August concerning for osteomyelitis however despite this and discussion with patient he is currently not interested in proceeding with any surgical intervention.  Depo-Medrol injection for right knee pain and stiffness.   Follow-Up Instructions: No follow-ups on file.   Right Knee Exam   Tenderness  The patient is experiencing tenderness in the medial joint line.  Range of Motion  Extension:  -15  Flexion:  90   Tests  Varus: negative Valgus: negative  Other  Effusion: effusion present      Patient is alert, oriented, no adenopathy, well-dressed, normal affect, normal respiratory effort. On examination of the right lower extremity there is massive mixed venous and lymphatic edema with blisters ulcerations some of these have been unroofed he has proximal ulcers which are scattered x4 weeping serosanguineous drainage largest ulcer currently measuring 2 cm in diameter also has plantar ulceration which is stable at 15 mm in diameter this does not probe to bone today there is no drainage or erythema or odor  Imaging: No results found. No images are attached to the encounter.  Labs: Lab Results  Component Value Date   HGBA1C 8.3 (H) 06/20/2022   HGBA1C 10.9 (H) 04/14/2021   HGBA1C 10.5 (H) 02/07/2021   ESRSEDRATE 137 (H) 07/08/2022   ESRSEDRATE 107 (H) 06/27/2022  ESRSEDRATE 108 (H) 04/08/2021   CRP 14.1 (H) 07/08/2022   CRP 16.9 (H) 06/27/2022   CRP 27.2 (H) 04/08/2021   LABURIC 5.3 07/04/2022   LABURIC 7.0 01/14/2016   REPTSTATUS 06/24/2022 FINAL 06/22/2022   GRAMSTAIN NO WBC SEEN NO ORGANISMS SEEN  04/18/2021   CULT (A) 06/22/2022    <10,000 COLONIES/mL INSIGNIFICANT GROWTH Performed at [facility], [address] [place], [place] [address]    LABORGA PROTEUS MIRABILIS 04/18/2021     Lab Results   Component Value Date   ALBUMIN 2.1 (L) 07/10/2022   ALBUMIN 2.0 (L) 07/09/2022   ALBUMIN 1.8 (L) 07/03/2022    Lab Results  Component Value Date   MG 1.3 (L) 07/10/2022   MG 1.6 (L) 07/08/2022   MG 1.3 (L) 07/07/2022   No results found for: "VD25OH"  No results found for: "PREALBUMIN"    Latest Ref Rng & Units 07/10/2022    8:08 AM 07/09/2022    9:04 AM 07/08/2022    5:05 AM  CBC EXTENDED  WBC 4.0 - 10.5 K/uL 8.2  8.9  8.7   RBC 4.22 - 5.81 MIL/uL 3.03  2.99  2.89   Hemoglobin 13.0 - 17.0 g/dL 58.5  27.7  9.9   HCT 82.4 - 52.0 % 31.3  31.0  29.5   Platelets 150 - 400 K/uL 295  307  300   NEUT# 1.7 - 7.7 K/uL 5.1  5.9    Lymph# 0.7 - 4.0 K/uL 2.0  1.8       There is no height or weight on file to calculate BMI.  Orders:  No orders of the defined types were placed in this encounter.  No orders of the defined types were placed in this encounter.    Procedures: Large Joint Inj on 09/18/2022 2:17 PM Indications: pain Details: 18 G 1.5 in needle, superolateral approach Medications: 5 mL lidocaine 1 %; 40 mg methylPREDNISolone acetate 40 MG/ML Aspirate: 0 mL Outcome: tolerated well, no immediate complications Consent was given by the patient.      Clinical Data: No additional findings.  ROS:  All other systems negative, except as noted in the HPI. Review of Systems  Objective: Vital Signs: There were no vitals taken for this visit.  Specialty Comments:  No specialty comments available.  PMFS History: Patient Active Problem List   Diagnosis Date Noted   Hyperkalemia 07/10/2022   Chronic painful diabetic neuropathy (HCC) 07/10/2022   Metabolic acidosis, normal anion gap (NAG) 07/10/2022   Hypomagnesemia 07/10/2022   Charcot's arthropathy 06/28/2022   Leukocytosis    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)    Severe protein-calorie malnutrition (HCC)    Diabetic foot infection (HCC) 06/22/2022    Sepsis (HCC) 06/22/2022   Cellulitis in diabetic foot (HCC) 04/17/2021   Osteomyelitis (HCC) 04/14/2021   HTN (hypertension) 04/08/2021   HLD (hyperlipidemia) 04/08/2021   Alcohol abuse    DM (diabetes mellitus) (HCC) 01/27/2014   AKI (acute kidney injury) (HCC) 01/27/2014   Past Medical History:  Diagnosis Date   AKI (acute kidney injury) (HCC) 01/27/2014   Alcohol abuse    Anasarca    Bilateral leg edema 02/07/2021   Cellulitis    Cellulitis and abscess 01/26/2014   Cellulitis of right foot 04/17/2021   Dental abscess 01/26/2014   Diabetes mellitus type 2 in obese [facility])    Diabetic foot infection (HCC) 04/08/2021   Diabetic ulcer of toe of right foot  associated with type 2 diabetes mellitus, limited to breakdown of skin (HCC)    Facial cellulitis 01/26/2014   HTN (hypertension)    Hypokalemia 01/28/2014   Infected dental carries 01/26/2014   Leukocytosis 01/27/2014   Osteomyelitis (HCC) 04/14/2021   Right foot infection     No family history on file.  Past Surgical History:  Procedure Laterality Date   I & D EXTREMITY Right 04/18/2021   Procedure: IRRIGATION AND DEBRIDEMENT OF FOOT;  Surgeon: Nadara Mustard, MD;  Location: [facility] OR;  Service: Orthopedics;  Laterality: Right;   I & D EXTREMITY Right 04/20/2021   Procedure: EXCISIONAL DEBRIDEMENT RIGHT FOOT, APPLICATION OF SKIN GRAFT;  Surgeon: Nadara Mustard, MD;  Location: [facility] OR;  Service: Orthopedics;  Laterality: Right;   Social History   Occupational History   Occupation: Curator  Tobacco Use   Smoking status: Never   Smokeless tobacco: Never  Vaping Use   Vaping Use: Never used  Substance and Sexual Activity   Alcohol use: Yes    Alcohol/week: 2.0 standard drinks of alcohol    Types: 2 Cans of beer per week    Comment: Occasional   Drug use: No   Sexual activity: Not Currently    Partners: Female

## 2022-09-23 ENCOUNTER — Other Ambulatory Visit: Payer: Self-pay | Admitting: Vascular Surgery

## 2022-09-23 DIAGNOSIS — E11621 Type 2 diabetes mellitus with foot ulcer: Secondary | ICD-10-CM

## 2022-10-08 ENCOUNTER — Ambulatory Visit: Payer: Medicaid Other | Admitting: Family

## 2022-10-10 ENCOUNTER — Encounter (INDEPENDENT_AMBULATORY_CARE_PROVIDER_SITE_OTHER): Payer: Self-pay | Admitting: Primary Care

## 2022-10-10 ENCOUNTER — Ambulatory Visit (INDEPENDENT_AMBULATORY_CARE_PROVIDER_SITE_OTHER): Payer: Medicaid Other | Admitting: Primary Care

## 2022-10-10 VITALS — BP 171/91 | HR 87 | Resp 16 | Ht 72.0 in | Wt 242.4 lb

## 2022-10-10 DIAGNOSIS — E1169 Type 2 diabetes mellitus with other specified complication: Secondary | ICD-10-CM | POA: Diagnosis not present

## 2022-10-10 DIAGNOSIS — I10 Essential (primary) hypertension: Secondary | ICD-10-CM

## 2022-10-10 DIAGNOSIS — L03119 Cellulitis of unspecified part of limb: Secondary | ICD-10-CM

## 2022-10-10 DIAGNOSIS — Z794 Long term (current) use of insulin: Secondary | ICD-10-CM

## 2022-10-10 DIAGNOSIS — E11628 Type 2 diabetes mellitus with other skin complications: Secondary | ICD-10-CM

## 2022-10-10 LAB — POCT GLYCOSYLATED HEMOGLOBIN (HGB A1C): HbA1c, POC (controlled diabetic range): 6.3 % (ref 0.0–7.0)

## 2022-10-10 MED ORDER — VALSARTAN-HYDROCHLOROTHIAZIDE 160-25 MG PO TABS: 1.0000 | ORAL_TABLET | Freq: Every day | ORAL | 3 refills | Status: DC

## 2022-10-11 ENCOUNTER — Encounter: Payer: Self-pay | Admitting: Family

## 2022-10-11 ENCOUNTER — Ambulatory Visit (INDEPENDENT_AMBULATORY_CARE_PROVIDER_SITE_OTHER): Payer: Medicaid Other | Admitting: Family

## 2022-10-11 ENCOUNTER — Ambulatory Visit: Payer: Medicaid Other | Admitting: Family

## 2022-10-11 DIAGNOSIS — I872 Venous insufficiency (chronic) (peripheral): Secondary | ICD-10-CM

## 2022-10-11 DIAGNOSIS — M1711 Unilateral primary osteoarthritis, right knee: Secondary | ICD-10-CM

## 2022-10-11 DIAGNOSIS — I890 Lymphedema, not elsewhere classified: Secondary | ICD-10-CM

## 2022-10-11 DIAGNOSIS — L98499 Non-pressure chronic ulcer of skin of other sites with unspecified severity: Secondary | ICD-10-CM | POA: Diagnosis not present

## 2022-10-11 DIAGNOSIS — R600 Localized edema: Secondary | ICD-10-CM | POA: Diagnosis not present

## 2022-10-11 NOTE — Progress Notes (Signed)
Office Visit Note   Patient: Jesse [name]           Date of Birth: Jun 21, 1961           MRN: [id number] Visit Date: 10/11/2022              Requested by: No referring provider defined for this encounter. PCP: Pcp, No  Chief Complaint  Patient presents with   Right Leg - Follow-up      HPI: The patient is a 61 year old gentleman who presents today in follow-up for knee pain on the right he is status post a Depo-Medrol injection of the right knee about 3 weeks ago.  He states he has had some resolution of his pain and symptoms of the right knee wonders if he can begin walking on a treadmill.  Wonders if exercise helpful with his knee pain and swelling to the bilateral doing dry dressing changes to anterior ulcer to his right lower extremity has a Ace wrap in place today  Denies fevers or chills.  Reports difficulty finding shoe wear that we will fit due to his edema difficulty finding socks to purchase that fit.  Assessment & Plan: Visit Diagnoses: No diagnosis found.  Plan: May weight-bear as tolerated.  May increase his activities as he tolerates.  Daily Dial soap cleansing of the ulcer to the right lower leg dry dressings may use Neosporin if he would like wrapped with an Ace wrap today.  Given a postop shoe for the right foot today  Follow-Up Instructions: Return in about 4 weeks (around 11/08/2022), or if symptoms worsen or fail to improve.   Ortho Exam  Patient is alert, oriented, no adenopathy, well-dressed, normal affect, normal respiratory effort. On examination of the right lower extremity the distal ulcer is well-healed proximally to this he does have a new ulcer which is 3 cm in diameter with no depth filled in with 100% granulation there is no drainage or surrounding maceration no warmth no fluctuance.    His edema to the lower extremities bilaterally is mildly improved since last appointment.  Stable.  To the left lower extremity he does have chronic venous  and lymphatic edema with some weeping serous drainage which is dried today.  Imaging: No results found. No images are attached to the encounter.  Labs: Lab Results  Component Value Date   HGBA1C 6.3 10/10/2022   HGBA1C 8.3 (H) 06/20/2022   HGBA1C 10.9 (H) 04/14/2021   ESRSEDRATE 137 (H) 07/08/2022   ESRSEDRATE 107 (H) 06/27/2022   ESRSEDRATE 108 (H) 04/08/2021   CRP 14.1 (H) 07/08/2022   CRP 16.9 (H) 06/27/2022   CRP 27.2 (H) 04/08/2021   LABURIC 5.3 07/04/2022   LABURIC 7.0 01/14/2016   REPTSTATUS 06/24/2022 FINAL 06/22/2022   GRAMSTAIN NO WBC SEEN NO ORGANISMS SEEN  04/18/2021   CULT (A) 06/22/2022    <10,000 COLONIES/mL INSIGNIFICANT GROWTH Performed at [facility], [address], [place], [place] [address]    LABORGA PROTEUS MIRABILIS 04/18/2021     Lab Results  Component Value Date   ALBUMIN 2.1 (L) 07/10/2022   ALBUMIN 2.0 (L) 07/09/2022   ALBUMIN 1.8 (L) 07/03/2022    Lab Results  Component Value Date   MG 1.3 (L) 07/10/2022   MG 1.6 (L) 07/08/2022   MG 1.3 (L) 07/07/2022   No results found for: "VD25OH"  No results found for: "PREALBUMIN"    Latest Ref Rng & Units 07/10/2022    8:08 AM 07/09/2022  9:04 AM 07/08/2022    5:05 AM  CBC EXTENDED  WBC 4.0 - 10.5 K/uL 8.2  8.9  8.7   RBC 4.22 - 5.81 MIL/uL 3.03  2.99  2.89   Hemoglobin 13.0 - 17.0 g/dL 38.1  01.7  9.9   HCT 51.0 - 52.0 % 31.3  31.0  29.5   Platelets 150 - 400 K/uL 295  307  300   NEUT# 1.7 - 7.7 K/uL 5.1  5.9    Lymph# 0.7 - 4.0 K/uL 2.0  1.8       There is no height or weight on file to calculate BMI.  Orders:  No orders of the defined types were placed in this encounter.  No orders of the defined types were placed in this encounter.    Procedures: No procedures performed  Clinical Data: No additional findings.  ROS:  All other systems negative, except as noted in the HPI. Review of Systems  Objective: Vital Signs: There were no vitals taken for this  visit.  Specialty Comments:  No specialty comments available.  PMFS History: Patient Active Problem List   Diagnosis Date Noted   Hyperkalemia 07/10/2022   Chronic painful diabetic neuropathy (HCC) 07/10/2022   Metabolic acidosis, normal anion gap (NAG) 07/10/2022   Hypomagnesemia 07/10/2022   Charcot's arthropathy 06/28/2022   Leukocytosis    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)    Severe protein-calorie malnutrition (HCC)    Diabetic foot infection (HCC) 06/22/2022   Sepsis (HCC) 06/22/2022   Cellulitis in diabetic foot (HCC) 04/17/2021   Osteomyelitis (HCC) 04/14/2021   HTN (hypertension) 04/08/2021   HLD (hyperlipidemia) 04/08/2021   Alcohol abuse    DM (diabetes mellitus) (HCC) 01/27/2014   AKI (acute kidney injury) (HCC) 01/27/2014   Past Medical History:  Diagnosis Date   AKI (acute kidney injury) (HCC) 01/27/2014   Alcohol abuse    Anasarca    Bilateral leg edema 02/07/2021   Cellulitis    Cellulitis and abscess 01/26/2014   Cellulitis of right foot 04/17/2021   Dental abscess 01/26/2014   Diabetes mellitus type 2 in obese [facility])    Diabetic foot infection (HCC) 04/08/2021   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 2 diabetes mellitus, limited to breakdown of skin (HCC)    Facial cellulitis 01/26/2014   HTN (hypertension)    Hypokalemia 01/28/2014   Infected dental carries 01/26/2014   Leukocytosis 01/27/2014   Osteomyelitis (HCC) 04/14/2021   Right foot infection     History reviewed. No pertinent family history.  Past Surgical History:  Procedure Laterality Date   I & D EXTREMITY Right 04/18/2021   Procedure: IRRIGATION AND DEBRIDEMENT OF FOOT;  Surgeon: Nadara Mustard, MD;  Location: [facility] OR;  Service: Orthopedics;  Laterality: Right;   I & D EXTREMITY Right 04/20/2021   Procedure: EXCISIONAL DEBRIDEMENT RIGHT FOOT, APPLICATION OF SKIN GRAFT;  Surgeon: Nadara Mustard, MD;  Location: [facility] OR;   Service: Orthopedics;  Laterality: Right;   Social History   Occupational History   Occupation: Curator  Tobacco Use   Smoking status: Never   Smokeless tobacco: Never  Vaping Use   Vaping Use: Never used  Substance and Sexual Activity   Alcohol use: Yes    Alcohol/week: 2.0 standard drinks of alcohol    Types: 2 Cans of beer per week    Comment: Occasional   Drug use: No   Sexual activity: Not Currently  Partners: Female

## 2022-10-11 NOTE — Progress Notes (Signed)
[facility]  Jesse [name], is a 61 y.o. male  [phone]  WJX:[id number]  DOB - May 03, 1961  Chief Complaint  Patient presents with   New Patient (Initial Visit)       Subjective:   Jesse [name] is a 61 y.o. male here today for a establish care followed by Dr. Lajoyce Corners spoke with him in regards to his leg odor weeping and swelling - suggested he may need to go to ED send to [facility]. Patient stated he had an appt with him tomorrow and will wait for appt. Patient drove/ride his scooter and walker to this appt.  Patient has No headache, No chest pain, No abdominal pain - No Nausea, No new weakness tingling or numbness, No Cough - shortness of breath  No problems updated.  No Known Allergies  Past Medical History:  Diagnosis Date   AKI (acute kidney injury) (HCC) 01/27/2014   Alcohol abuse    Anasarca    Bilateral leg edema 02/07/2021   Cellulitis    Cellulitis and abscess 01/26/2014   Cellulitis of right foot 04/17/2021   Dental abscess 01/26/2014   Diabetes mellitus type 2 in obese [facility])    Diabetic foot infection (HCC) 04/08/2021   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 2 diabetes mellitus, limited to breakdown of skin (HCC)    Facial cellulitis 01/26/2014   HTN (hypertension)    Hypokalemia 01/28/2014   Infected dental carries 01/26/2014   Leukocytosis 01/27/2014   Osteomyelitis (HCC) 04/14/2021   Right foot infection     Current Outpatient Medications on File Prior to Visit  Medication Sig Dispense Refill   gabapentin (NEURONTIN) 100 MG capsule Take 1 capsule (100 mg total) by mouth 3 (three) times daily. 90 capsule 0   insulin glargine (LANTUS) 100 UNIT/ML Solostar Pen INJECT 10 UNITS INTO THE SKIN DAILY. (Patient not taking: Reported on 07/04/2022) 6 mL 3   Insulin Pen Needle 32G X 4 MM MISC Use as directed with insulin pen 100 each 1   Nystatin (GERHARDT'S BUTT CREAM) CREA Apply 1 Application topically 3 (three) times daily. Apply to inner  thighs and buttocks liberally (Patient not taking: Reported on 07/04/2022) 30 each 2   thiamine (VITAMIN B1) 100 MG tablet Take 1 tablet (100 mg total) by mouth daily. (Patient not taking: Reported on 07/04/2022) 30 tablet 0   [DISCONTINUED] glipiZIDE (GLUCOTROL) 5 MG tablet Take 1 tablet (5 mg total) by mouth daily before breakfast. (Patient not taking: Reported on 01/14/2016) 30 tablet 0   No current facility-administered medications on file prior to visit.    Objective:   Vitals:   10/10/22 1530  BP: (Abnormal) 171/91  Pulse: 87  Resp: 16  SpO2: 97%  Weight: 242 lb 6.4 oz (110 kg)  Height: 6' (1.829 m)    Exam General appearance : Awake, alert, not in any distress. Speech Clear. Not toxic looking HEENT: Atraumatic and Normocephalic, pupils equally reactive to light and accomodation Neck: Supple, no JVD. No cervical lymphadenopathy.  Chest: Good air entry bilaterally, no added sounds  CVS: S1 S2 regular, no murmurs.  Abdomen: Bowel sounds present, Non tender and not distended with no gaurding, rigidity or rebound. Extremities: B/L Lower Ext shows no edema, both legs are warm to touch Neurology: Awake alert, and oriented X 3, CN II-XII intact, Non focal Skin: No Rash  Data Review Lab Results  Component Value Date   HGBA1C 6.3 10/10/2022   HGBA1C 8.3 (H) 06/20/2022   HGBA1C  10.9 (H) 04/14/2021    Assessment & Plan   1. Type 2 diabetes mellitus with other specified complication, with long-term current use of insulin (HCC) - POCT glycosylated hemoglobin (Hb A1C) - educated on lifestyle modifications, including but not limited to diet choices and adding exercise to daily routine.    2. Primary hypertension BP goal - < 130/80 Explained that having normal blood pressure is the goal and medications are helping to get to goal and maintain normal blood pressure. DIET: Limit salt intake, read nutrition labels to check salt content, limit fried and high fatty foods  Avoid using  multisymptom OTC cold preparations that generally contain sudafed which can rise BP. Consult with pharmacist on best cold relief products to use for persons with HTN EXERCISE Discussed incorporating exercise such as walking - 30 minutes most days of the week and can do in 10 minute intervals    - valsartan-hydrochlorothiazide (DIOVAN-HCT) 160-25 MG tablet; Take 1 tablet by mouth daily.  Dispense: 90 tablet; Refill: 3  Cellulitis in diabetic foot [facility])       Patient have been counseled extensively about nutrition and exercise. Other issues discussed during this visit include: low cholesterol diet, weight control and daily exercise, foot care, annual eye examinations at Ophthalmology, importance of adherence with medications and regular follow-up. We also discussed long term complications of uncontrolled diabetes and hypertension.   No follow-ups on file.  The patient was given clear instructions to go to ER or return to medical center if symptoms don't improve, worsen or new problems develop. The patient verbalized understanding. The patient was told to call to get lab results if they haven't heard anything in the next week.   This note has been created with Education officer, environmental. Any transcriptional errors are unintentional.   Grayce Sessions, NP 10/11/2022, 11:01 AM

## 2022-10-28 ENCOUNTER — Encounter (INDEPENDENT_AMBULATORY_CARE_PROVIDER_SITE_OTHER): Payer: Self-pay | Admitting: Primary Care

## 2022-10-28 ENCOUNTER — Ambulatory Visit (INDEPENDENT_AMBULATORY_CARE_PROVIDER_SITE_OTHER): Payer: Medicaid Other | Admitting: Primary Care

## 2022-10-28 VITALS — BP 180/96 | HR 82 | Ht 72.0 in | Wt 246.6 lb

## 2022-10-28 DIAGNOSIS — Z1211 Encounter for screening for malignant neoplasm of colon: Secondary | ICD-10-CM | POA: Diagnosis not present

## 2022-10-28 DIAGNOSIS — E1169 Type 2 diabetes mellitus with other specified complication: Secondary | ICD-10-CM

## 2022-10-28 DIAGNOSIS — I10 Essential (primary) hypertension: Secondary | ICD-10-CM | POA: Diagnosis not present

## 2022-10-28 DIAGNOSIS — I890 Lymphedema, not elsewhere classified: Secondary | ICD-10-CM | POA: Diagnosis not present

## 2022-10-28 DIAGNOSIS — Z794 Long term (current) use of insulin: Secondary | ICD-10-CM

## 2022-10-28 NOTE — Progress Notes (Signed)
[facility]   Jesse [name] is a 61 y.o. male presents for hypertension evaluation, Denies shortness of breath, headaches, chest pain or lower extremity edema, sudden onset, vision changes, unilateral weakness, dizziness, paresthesias.  Blood pressure is extremely high question about compliance to medication stated he has never picked it up due to transportation.  Unclear following up on hypertension without being on medication.  Patient reports adherence with medications.  Dietary habits include: Monitoring sodium intake Exercise habits include: Difficulty at this time bilateral lower extremity cellulitis/edema uses a walker Family / Social history: no   Past Medical History:  Diagnosis Date   AKI (acute kidney injury) (HCC) 01/27/2014   Alcohol abuse    Anasarca    Bilateral leg edema 02/07/2021   Cellulitis    Cellulitis and abscess 01/26/2014   Cellulitis of right foot 04/17/2021   Dental abscess 01/26/2014   Diabetes mellitus type 2 in obese [facility])    Diabetic foot infection (HCC) 04/08/2021   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 2 diabetes mellitus, limited to breakdown of skin (HCC)    Facial cellulitis 01/26/2014   HTN (hypertension)    Hypokalemia 01/28/2014   Infected dental carries 01/26/2014   Leukocytosis 01/27/2014   Osteomyelitis (HCC) 04/14/2021   Right foot infection    Past Surgical History:  Procedure Laterality Date   I & D EXTREMITY Right 04/18/2021   Procedure: IRRIGATION AND DEBRIDEMENT OF FOOT;  Surgeon: Nadara Mustard, MD;  Location: [facility] OR;  Service: Orthopedics;  Laterality: Right;   I & D EXTREMITY Right 04/20/2021   Procedure: EXCISIONAL DEBRIDEMENT RIGHT FOOT, APPLICATION OF SKIN GRAFT;  Surgeon: Nadara Mustard, MD;  Location: [facility] OR;  Service: Orthopedics;  Laterality: Right;   No Known Allergies Current Outpatient Medications on File Prior to Visit  Medication Sig Dispense Refill   Nystatin (GERHARDT'S BUTT CREAM) CREA  Apply 1 Application topically 3 (three) times daily. Apply to inner thighs and buttocks liberally 30 each 2   thiamine (VITAMIN B1) 100 MG tablet Take 1 tablet (100 mg total) by mouth daily. 30 tablet 0   valsartan-hydrochlorothiazide (DIOVAN-HCT) 160-25 MG tablet Take 1 tablet by mouth daily. 90 tablet 3   gabapentin (NEURONTIN) 100 MG capsule Take 1 capsule (100 mg total) by mouth 3 (three) times daily. (Patient not taking: Reported on 10/28/2022) 90 capsule 0   insulin glargine (LANTUS) 100 UNIT/ML Solostar Pen INJECT 10 UNITS INTO THE SKIN DAILY. (Patient not taking: Reported on 07/04/2022) 6 mL 3   Insulin Pen Needle 32G X 4 MM MISC Use as directed with insulin pen (Patient not taking: Reported on 10/28/2022) 100 each 1   [DISCONTINUED] glipiZIDE (GLUCOTROL) 5 MG tablet Take 1 tablet (5 mg total) by mouth daily before breakfast. (Patient not taking: Reported on 01/14/2016) 30 tablet 0   No current facility-administered medications on file prior to visit.   Social History   Socioeconomic History   Marital status: Single    Spouse name: Not on file   Number of children: Not on file   Years of education: 10th grade   Highest education level: Not on file  Occupational History   Occupation: Curator  Tobacco Use   Smoking status: Never   Smokeless tobacco: Never  Vaping Use   Vaping Use: Never used  Substance and Sexual Activity   Alcohol use: Not Currently    Alcohol/week: 2.0 standard drinks of alcohol    Types: 2 Cans of beer  per week    Comment: Occasional   Drug use: No   Sexual activity: Not Currently    Partners: Female  Other Topics Concern   Not on file  Social History Narrative   Not on file   Social Determinants of Health   Financial Resource Strain: Not on file  Food Insecurity: Not on file  Transportation Needs: Not on file  Physical Activity: Not on file  Stress: Not on file  Social Connections: Not on file  Intimate Partner Violence: Not on file   History  reviewed. No pertinent family history.   OBJECTIVE:  Vitals:   10/28/22 1533  BP: (Abnormal) 177/97  Pulse: 82  SpO2: 98%  Weight: 246 lb 9.6 oz (111.9 kg)    Physical Exam Constitutional:      Appearance: He is obese.  HENT:     Head: Normocephalic.     Right Ear: External ear normal.     Left Ear: External ear normal.  Eyes:     Extraocular Movements: Extraocular movements intact.  Cardiovascular:     Rate and Rhythm: Normal rate and regular rhythm.  Pulmonary:     Effort: Pulmonary effort is normal.     Breath sounds: Normal breath sounds.  Abdominal:     General: Bowel sounds are normal. There is distension.     Palpations: Abdomen is soft.  Musculoskeletal:        General: Swelling present. Normal range of motion.     Cervical back: Normal range of motion and neck supple.  Skin:    Findings: Lesion and rash present.  Neurological:     Mental Status: He is alert and oriented to person, place, and time.  Psychiatric:        Mood and Affect: Mood normal.        Behavior: Behavior normal.      ROS Comprehensive ROS Pertinent positive and negative noted in HPI   Last 3 Office BP readings: BP Readings from Last 3 Encounters:  10/28/22 (Abnormal) 177/97  10/10/22 (Abnormal) 171/91  07/10/22 126/83    BMET    Component Value Date/Time   NA 135 07/10/2022 0808   K 4.6 07/10/2022 0808   CL 105 07/10/2022 0808   CO2 19 (L) 07/10/2022 0808   GLUCOSE 175 (H) 07/10/2022 0808   BUN 26 (H) 07/10/2022 0808   CREATININE 1.48 (H) 07/10/2022 0808   CALCIUM 9.5 07/10/2022 0808   GFRNONAA 54 (L) 07/10/2022 0808   GFRAA >60 01/14/2016 1534    Renal function: CrCl cannot be calculated (Patient's most recent lab result is older than the maximum 21 days allowed.).  Clinical ASCVD: Yes  The 10-year ASCVD risk score (Arnett DK, et al., 2019) is: 40.7%   Values used to calculate the score:     Age: 58 years     Sex: Male     Is Non-Hispanic African American: Yes      Diabetic: Yes     Tobacco smoker: No     Systolic Blood Pressure: 123XX123 mmHg     Is BP treated: Yes     HDL Cholesterol: 45 mg/dL     Total Cholesterol: 223 mg/dL  ASCVD risk factors include- [place]   ASSESSMENT & PLAN: Jesse [name] was seen today for hypertension.  Diagnoses and all orders for this visit:  Colon cancer screening -     Ambulatory referral to Gastroenterology  Type 2 diabetes mellitus with other specified complication, with long-term current use of insulin ([place]) -  Microalbumin / creatinine urine ratio -     Ambulatory referral to Ophthalmology  Primary hypertension -Counseled on lifestyle modifications for blood pressure control including reduced dietary sodium, increased exercise, weight reduction and adequate sleep. Also, educated patient about the risk for cardiovascular events, stroke and heart attack. Also counseled patient about the importance of medication adherence. If you participate in smoking, it is important to stop using tobacco as this will increase the risks associated with uncontrolled blood pressure.   Minimize salt intake. Minimize alcohol intake  Lymphatic edema   Followed by orthopedics  This note has been created with Administrator. Any transcriptional errors are unintentional.   Kerin Perna, NP 10/28/2022, 3:37 PM

## 2022-11-08 ENCOUNTER — Ambulatory Visit: Payer: Medicaid Other | Admitting: Family

## 2022-11-14 ENCOUNTER — Other Ambulatory Visit (INDEPENDENT_AMBULATORY_CARE_PROVIDER_SITE_OTHER): Payer: Self-pay | Admitting: Primary Care

## 2022-11-14 DIAGNOSIS — I10 Essential (primary) hypertension: Secondary | ICD-10-CM

## 2022-11-14 NOTE — Telephone Encounter (Signed)
Medication Refill - Medication: gabapentin (NEURONTIN) 100 MG capsule  insulin glargine (LANTUS) 100 UNIT/ML Solostar Pen  thiamine (VITAMIN B1) 100 MG tablet  valsartan-hydrochlorothiazide (DIOVAN-HCT) 160-25 MG tablet      Has the patient contacted their pharmacy? Yes.    Pharmacy referred patient to PCP   Preferred Pharmacy (with phone number or street name):  [id number] - [place], [address] Phone: [phone]  Fax: [phone]     Has the patient been seen for an appointment in the last year OR does the patient have an upcoming appointment? Yes.    Agent: Please be advised that RX refills may take up to 3 business days. We ask that you follow-up with your pharmacy.

## 2022-11-14 NOTE — Telephone Encounter (Signed)
Requested medication (s) are due for refill today: routing for review  Requested medication (s) are on the active medication list:yes  Last refill:  07/03/22  Future visit scheduled: yes  Notes to clinic:  Unable to refill per protocol, last refill by another provider.      Requested Prescriptions  Pending Prescriptions Disp Refills   gabapentin (NEURONTIN) 100 MG capsule 90 capsule 0    Sig: Take 1 capsule (100 mg total) by mouth 3 (three) times daily.     Neurology: Anticonvulsants - gabapentin Failed - 11/14/2022  1:27 PM      Failed - Cr in normal range and within 360 days    Creatinine, Ser  Date Value Ref Range Status  07/10/2022 1.48 (H) 0.61 - 1.24 mg/dL Final   Creatinine, Urine  Date Value Ref Range Status  02/09/2021 104.96 mg/dL Final         Passed - Completed PHQ-2 or PHQ-9 in the last 360 days      Passed - Valid encounter within last 12 months    Recent Outpatient Visits           2 weeks ago Colon cancer screening   [place], Michelle P, NP   1 month ago Type 2 diabetes mellitus with other specified complication, with long-term current use of insulin ([place])   [place], Michelle P, NP       Future Appointments             In 5 days Suzan Slick, NP [place]   In 1 week Kerin Perna, NP [facility]             insulin glargine (LANTUS) 100 UNIT/ML Solostar Pen 6 mL 3    Sig: INJECT 10 UNITS INTO THE SKIN DAILY.     Endocrinology:  Diabetes - Insulins Passed - 11/14/2022  1:27 PM      Passed - HBA1C is between 0 and 7.9 and within 180 days    HbA1c, POC (controlled diabetic range)  Date Value Ref Range Status  10/10/2022 6.3 0.0 - 7.0 % Final         Passed - Valid encounter within last 6 months    Recent Outpatient Visits           2 weeks ago Colon cancer screening   [place], Michelle P,  NP   1 month ago Type 2 diabetes mellitus with other specified complication, with long-term current use of insulin ([place])   [place], Michelle P, NP       Future Appointments             In 5 days Suzan Slick, NP [place]   In 1 week Kerin Perna, NP [facility]             thiamine (VITAMIN B1) 100 MG tablet 30 tablet 0    Sig: Take 1 tablet (100 mg total) by mouth daily.     Off-Protocol Failed - 11/14/2022  1:27 PM      Failed - Medication not assigned to a protocol, review manually.      Passed - Valid encounter within last 12 months    Recent Outpatient Visits           2 weeks ago Colon cancer screening   [facility]  [facility] Juluis Mire P, NP   1 month ago Type 2 diabetes mellitus with other specified complication, with long-term current use of insulin ([place])   [place] [facility] Kerin Perna, NP       Future Appointments             In 5 days Suzan Slick, NP [place]   In 1 week Kerin Perna, NP [facility]             valsartan-hydrochlorothiazide (DIOVAN-HCT) 160-25 MG tablet 90 tablet 3    Sig: Take 1 tablet by mouth daily.     Cardiovascular: ARB + Diuretic Combos Failed - 11/14/2022  1:27 PM      Failed - Cr in normal range and within 180 days    Creatinine, Ser  Date Value Ref Range Status  07/10/2022 1.48 (H) 0.61 - 1.24 mg/dL Final   Creatinine, Urine  Date Value Ref Range Status  02/09/2021 104.96 mg/dL Final         Failed - Last BP in normal range    BP Readings from Last 1 Encounters:  10/28/22 (!) 180/96         Passed - K in normal range and within 180 days    Potassium  Date Value Ref Range Status  07/10/2022 4.6 3.5 - 5.1 mmol/L Final         Passed - Na in normal range and within 180 days    Sodium  Date Value Ref Range Status  07/10/2022 135 135 -  145 mmol/L Final         Passed - eGFR is 10 or above and within 180 days    GFR calc Af Amer  Date Value Ref Range Status  01/14/2016 >60 >60 mL/min Final    Comment:    (NOTE) The eGFR has been calculated using the CKD EPI equation. This calculation has not been validated in all clinical situations. eGFR's persistently <60 mL/min signify possible Chronic Kidney Disease.    GFR, Estimated  Date Value Ref Range Status  07/10/2022 54 (L) >60 mL/min Final    Comment:    (NOTE) Calculated using the CKD-EPI Creatinine Equation (2021)          Passed - Patient is not pregnant      Passed - Valid encounter within last 6 months    Recent Outpatient Visits           2 weeks ago Colon cancer screening   [place], Michelle P, NP   1 month ago Type 2 diabetes mellitus with other specified complication, with long-term current use of insulin ([place])   [place], [place], NP       Future Appointments             In 5 days Suzan Slick, NP [place]   In 1 week Kerin Perna, NP [place]

## 2022-11-18 NOTE — Telephone Encounter (Signed)
Will forward to provider. Some medications were prescribed by another provider

## 2022-11-19 ENCOUNTER — Telehealth: Payer: Self-pay | Admitting: Primary Care

## 2022-11-19 ENCOUNTER — Ambulatory Visit: Payer: Medicaid Other | Admitting: Family

## 2022-11-19 NOTE — Telephone Encounter (Signed)
Patient has refills of valsartan-HCTZ at his pharmacy. Will need refills of:   - Gabapentin - Lantus - Thiamine  These were written by a provider outside of my refill protocol. Will forward request to patient's PCP.

## 2022-11-19 NOTE — Telephone Encounter (Signed)
Copied from [place] [id number]. Topic: General - Other >> Nov 19, 2022  4:05 PM Everette C wrote: The patient has called to check on the status of their previous refill requests of  gabapentin (NEURONTIN) 100 MG capsule  insulin glargine (LANTUS) 100 UNIT/ML Solostar Pen  thiamine (VITAMIN B1) 100 MG tablet  valsartan-hydrochlorothiazide (DIOVAN-HCT) 160-25 MG tablet   Please contact the patient further when possible to discuss the status of their prescriptions

## 2022-11-19 NOTE — Telephone Encounter (Signed)
Pt called to report that he is completely out of his medications

## 2022-11-19 NOTE — Telephone Encounter (Signed)
Copied from CRM [id number]. Topic: General - Other >> Nov 19, 2022  4:05 PM Everette C wrote: The patient has called to check on the status of their previous refill requests of  gabapentin (NEURONTIN) 100 MG capsule  insulin glargine (LANTUS) 100 UNIT/ML Solostar Pen  thiamine (VITAMIN B1) 100 MG tablet  valsartan-hydrochlorothiazide (DIOVAN-HCT) 160-25 MG tablet   Please contact the patient further when possible to discuss the status of their prescriptions 

## 2022-11-20 MED ORDER — GABAPENTIN 100 MG PO CAPS
100.0000 mg | ORAL_CAPSULE | Freq: Three times a day (TID) | ORAL | 0 refills | Status: DC
Start: 2022-11-20 — End: 2023-03-18

## 2022-11-20 MED ORDER — INSULIN GLARGINE 100 UNIT/ML SOLOSTAR PEN
10.0000 [IU] | PEN_INJECTOR | Freq: Every day | SUBCUTANEOUS | 3 refills | Status: DC
Start: 2022-11-20 — End: 2023-03-18

## 2022-11-20 MED ORDER — THIAMINE HCL 100 MG PO TABS: 100.0000 mg | ORAL_TABLET | Freq: Every day | ORAL | 0 refills | Status: DC

## 2022-11-20 MED ORDER — VALSARTAN-HYDROCHLOROTHIAZIDE 160-25 MG PO TABS: 1.0000 | ORAL_TABLET | Freq: Every day | ORAL | 3 refills | Status: DC

## 2022-11-20 NOTE — Telephone Encounter (Signed)
Will forward to provider  

## 2022-11-21 ENCOUNTER — Emergency Department (HOSPITAL_COMMUNITY): Payer: Medicaid Other

## 2022-11-21 ENCOUNTER — Observation Stay (HOSPITAL_COMMUNITY)
Admission: EM | Admit: 2022-11-21 | Discharge: 2022-11-23 | Disposition: A | Discharge status: Home / Self Care | Payer: Medicaid Other | Attending: General Surgery | Admitting: General Surgery

## 2022-11-21 DIAGNOSIS — Z79899 Other long term (current) drug therapy: Secondary | ICD-10-CM | POA: Insufficient documentation

## 2022-11-21 DIAGNOSIS — S065X0A Traumatic subdural hemorrhage without loss of consciousness, initial encounter: Secondary | ICD-10-CM | POA: Diagnosis not present

## 2022-11-21 DIAGNOSIS — Z794 Long term (current) use of insulin: Secondary | ICD-10-CM | POA: Diagnosis not present

## 2022-11-21 DIAGNOSIS — Z7984 Long term (current) use of oral hypoglycemic drugs: Secondary | ICD-10-CM | POA: Insufficient documentation

## 2022-11-21 DIAGNOSIS — S01111A Laceration without foreign body of right eyelid and periocular area, initial encounter: Secondary | ICD-10-CM | POA: Insufficient documentation

## 2022-11-21 DIAGNOSIS — H0520 Unspecified exophthalmos: Secondary | ICD-10-CM | POA: Insufficient documentation

## 2022-11-21 DIAGNOSIS — S020XXA Fracture of vault of skull, initial encounter for closed fracture: Secondary | ICD-10-CM | POA: Diagnosis not present

## 2022-11-21 DIAGNOSIS — S0591XA Unspecified injury of right eye and orbit, initial encounter: Secondary | ICD-10-CM | POA: Diagnosis present

## 2022-11-21 DIAGNOSIS — S0993XA Unspecified injury of face, initial encounter: Secondary | ICD-10-CM

## 2022-11-21 DIAGNOSIS — S0292XA Unspecified fracture of facial bones, initial encounter for closed fracture: Secondary | ICD-10-CM | POA: Diagnosis not present

## 2022-11-21 DIAGNOSIS — E113593 Type 2 diabetes mellitus with proliferative diabetic retinopathy without macular edema, bilateral: Secondary | ICD-10-CM | POA: Diagnosis not present

## 2022-11-21 DIAGNOSIS — I10 Essential (primary) hypertension: Secondary | ICD-10-CM | POA: Insufficient documentation

## 2022-11-21 DIAGNOSIS — S299XXA Unspecified injury of thorax, initial encounter: Secondary | ICD-10-CM | POA: Insufficient documentation

## 2022-11-21 DIAGNOSIS — S0590XA Unspecified injury of unspecified eye and orbit, initial encounter: Secondary | ICD-10-CM | POA: Diagnosis present

## 2022-11-21 LAB — CBC WITH DIFFERENTIAL/PLATELET
Abs Immature Granulocytes: 0.03 10*3/uL (ref 0.00–0.07)
Basophils Absolute: 0 10*3/uL (ref 0.0–0.1)
Basophils Relative: 0 %
Eosinophils Absolute: 0 10*3/uL (ref 0.0–0.5)
Eosinophils Relative: 0 %
HCT: 38.1 % — ABNORMAL LOW (ref 39.0–52.0)
Hemoglobin: 12.7 g/dL — ABNORMAL LOW (ref 13.0–17.0)
Immature Granulocytes: 0 %
Lymphocytes Relative: 9 %
Lymphs Abs: 0.9 10*3/uL (ref 0.7–4.0)
MCH: 31.4 pg (ref 26.0–34.0)
MCHC: 33.3 g/dL (ref 30.0–36.0)
MCV: 94.3 fL (ref 80.0–100.0)
Monocytes Absolute: 0.8 10*3/uL (ref 0.1–1.0)
Monocytes Relative: 8 %
Neutro Abs: 8.6 10*3/uL — ABNORMAL HIGH (ref 1.7–7.7)
Neutrophils Relative %: 83 %
Platelets: 237 10*3/uL (ref 150–400)
RBC: 4.04 MIL/uL — ABNORMAL LOW (ref 4.22–5.81)
RDW: 15.2 % (ref 11.5–15.5)
WBC: 10.4 10*3/uL (ref 4.0–10.5)
nRBC: 0 % (ref 0.0–0.2)

## 2022-11-21 LAB — CBC
HCT: 36 % — ABNORMAL LOW (ref 39.0–52.0)
Hemoglobin: 12.1 g/dL — ABNORMAL LOW (ref 13.0–17.0)
MCH: 31.6 pg (ref 26.0–34.0)
MCHC: 33.6 g/dL (ref 30.0–36.0)
MCV: 94 fL (ref 80.0–100.0)
Platelets: 281 10*3/uL (ref 150–400)
RBC: 3.83 MIL/uL — ABNORMAL LOW (ref 4.22–5.81)
RDW: 15.2 % (ref 11.5–15.5)
WBC: 10.5 10*3/uL (ref 4.0–10.5)
nRBC: 0 % (ref 0.0–0.2)

## 2022-11-21 LAB — COMPREHENSIVE METABOLIC PANEL
ALT: 19 U/L (ref 0–44)
AST: 35 U/L (ref 15–41)
Albumin: 3.5 g/dL (ref 3.5–5.0)
Alkaline Phosphatase: 100 U/L (ref 38–126)
Anion gap: 8 (ref 5–15)
BUN: 15 mg/dL (ref 6–20)
CO2: 22 mmol/L (ref 22–32)
Calcium: 8.6 mg/dL — ABNORMAL LOW (ref 8.9–10.3)
Chloride: 107 mmol/L (ref 98–111)
Creatinine, Ser: 1 mg/dL (ref 0.61–1.24)
GFR, Estimated: >60 mL/min (ref >60)
Glucose, Bld: 244 mg/dL — ABNORMAL HIGH (ref 70–99)
Potassium: 3.3 mmol/L — ABNORMAL LOW (ref 3.5–5.1)
Sodium: 137 mmol/L (ref 135–145)
Total Bilirubin: 0.6 mg/dL (ref 0.3–1.2)
Total Protein: 8.3 g/dL — ABNORMAL HIGH (ref 6.5–8.1)

## 2022-11-21 LAB — CBG MONITORING, ED
Glucose-Capillary: 140 mg/dL — ABNORMAL HIGH (ref 70–99)
Glucose-Capillary: 136 mg/dL — ABNORMAL HIGH (ref 70–99)

## 2022-11-21 LAB — ETHANOL: Alcohol, Ethyl (B): <10 mg/dL (ref <10)

## 2022-11-21 MED ORDER — DORZOLAMIDE HCL-TIMOLOL MAL 2-0.5 % OP SOLN
1.0000 [drp] | Freq: Two times a day (BID) | OPHTHALMIC | Status: DC
  Administered 2022-11-21 – 2022-11-23 (×4): 1 [drp] via OPHTHALMIC
  Filled 2022-11-21 (×2): qty 10

## 2022-11-21 MED ORDER — DORZOLAMIDE HCL-TIMOLOL MAL 2-0.5 % OP SOLN
1.0000 [drp] | Freq: Once | OPHTHALMIC | Status: AC
  Administered 2022-11-21: 1 [drp] via OPHTHALMIC
  Filled 2022-11-21: qty 10

## 2022-11-21 MED ORDER — MORPHINE SULFATE (PF) 2 MG/ML IV SOLN: 2.0000 mg | INTRAVENOUS | Status: DC | PRN

## 2022-11-21 MED ORDER — BISACODYL 10 MG RE SUPP: 10.0000 mg | Freq: Every day | RECTAL | Status: DC | PRN

## 2022-11-21 MED ORDER — SODIUM CHLORIDE 0.9 % IV SOLN: INTRAVENOUS | Status: DC

## 2022-11-21 MED ORDER — ONDANSETRON HCL 4 MG/2ML IJ SOLN: 4.0000 mg | Freq: Four times a day (QID) | INTRAMUSCULAR | Status: DC | PRN

## 2022-11-21 MED ORDER — FLUORESCEIN SODIUM 1 MG OP STRP
1.0000 | ORAL_STRIP | Freq: Once | OPHTHALMIC | Status: AC
  Administered 2022-11-21: 1 via OPHTHALMIC
  Filled 2022-11-21: qty 1

## 2022-11-21 MED ORDER — MORPHINE SULFATE (PF) 4 MG/ML IV SOLN
4.0000 mg | Freq: Once | INTRAVENOUS | Status: AC
  Administered 2022-11-21: 4 mg via INTRAVENOUS
  Filled 2022-11-21: qty 1

## 2022-11-21 MED ORDER — THIAMINE MONONITRATE 100 MG PO TABS
100.0000 mg | ORAL_TABLET | Freq: Every day | ORAL | Status: DC
  Administered 2022-11-21 – 2022-11-23 (×3): 100 mg via ORAL
  Filled 2022-11-21 (×3): qty 1

## 2022-11-21 MED ORDER — METHOCARBAMOL 1000 MG/10ML IJ SOLN: 500.0000 mg | Freq: Three times a day (TID) | INTRAVENOUS | Status: DC | PRN

## 2022-11-21 MED ORDER — FOLIC ACID 1 MG PO TABS
1.0000 mg | ORAL_TABLET | Freq: Every day | ORAL | Status: DC
  Administered 2022-11-21 – 2022-11-23 (×3): 1 mg via ORAL
  Filled 2022-11-21 (×3): qty 1

## 2022-11-21 MED ORDER — ADULT MULTIVITAMIN W/MINERALS CH
1.0000 | ORAL_TABLET | Freq: Every day | ORAL | Status: DC
  Administered 2022-11-21 – 2022-11-23 (×3): 1 via ORAL
  Filled 2022-11-21 (×3): qty 1

## 2022-11-21 MED ORDER — THIAMINE HCL 100 MG/ML IJ SOLN
100.0000 mg | Freq: Every day | INTRAMUSCULAR | Status: DC
  Filled 2022-11-21: qty 2

## 2022-11-21 MED ORDER — SODIUM CHLORIDE (PF) 0.9 % IJ SOLN
INTRAMUSCULAR | Status: AC
  Filled 2022-11-21: qty 50

## 2022-11-21 MED ORDER — LEVETIRACETAM IN NACL 500 MG/100ML IV SOLN: 500.0000 mg | Freq: Two times a day (BID) | INTRAVENOUS | Status: DC

## 2022-11-21 MED ORDER — ONDANSETRON 4 MG PO TBDP: 4.0000 mg | ORAL_TABLET | Freq: Four times a day (QID) | ORAL | Status: DC | PRN

## 2022-11-21 MED ORDER — POLYETHYLENE GLYCOL 3350 17 G PO PACK: 17.0000 g | PACK | Freq: Every day | ORAL | Status: DC | PRN

## 2022-11-21 MED ORDER — OXYCODONE HCL 5 MG PO TABS
5.0000 mg | ORAL_TABLET | ORAL | Status: DC | PRN
  Administered 2022-11-22: 5 mg via ORAL
  Filled 2022-11-21: qty 1

## 2022-11-21 MED ORDER — HYDROCHLOROTHIAZIDE 25 MG PO TABS
25.0000 mg | ORAL_TABLET | Freq: Every day | ORAL | Status: DC
  Administered 2022-11-21 – 2022-11-23 (×3): 25 mg via ORAL
  Filled 2022-11-21 (×4): qty 1

## 2022-11-21 MED ORDER — LEVETIRACETAM 500 MG PO TABS
500.0000 mg | ORAL_TABLET | Freq: Two times a day (BID) | ORAL | Status: DC
  Administered 2022-11-21 – 2022-11-23 (×5): 500 mg via ORAL
  Filled 2022-11-21 (×5): qty 1

## 2022-11-21 MED ORDER — INSULIN ASPART 100 UNIT/ML IJ SOLN
0.0000 [IU] | Freq: Every day | INTRAMUSCULAR | Status: DC
  Filled 2022-11-21: qty 0.05

## 2022-11-21 MED ORDER — PANTOPRAZOLE SODIUM 40 MG PO TBEC
40.0000 mg | DELAYED_RELEASE_TABLET | Freq: Every day | ORAL | Status: DC
  Administered 2022-11-21 – 2022-11-23 (×3): 40 mg via ORAL
  Filled 2022-11-21 (×3): qty 1

## 2022-11-21 MED ORDER — IOHEXOL 300 MG/ML  SOLN
100.0000 mL | Freq: Once | INTRAMUSCULAR | Status: AC | PRN
  Administered 2022-11-21: 100 mL via INTRAVENOUS

## 2022-11-21 MED ORDER — ACETAMINOPHEN 325 MG PO TABS
650.0000 mg | ORAL_TABLET | Freq: Four times a day (QID) | ORAL | Status: DC
  Administered 2022-11-21 – 2022-11-22 (×3): 650 mg via ORAL
  Filled 2022-11-21 (×3): qty 2

## 2022-11-21 MED ORDER — HYDRALAZINE HCL 20 MG/ML IJ SOLN
10.0000 mg | INTRAMUSCULAR | Status: DC | PRN
  Administered 2022-11-22: 10 mg via INTRAVENOUS
  Filled 2022-11-21: qty 1

## 2022-11-21 MED ORDER — IRBESARTAN 150 MG PO TABS
150.0000 mg | ORAL_TABLET | Freq: Every day | ORAL | Status: DC
  Administered 2022-11-21 – 2022-11-23 (×3): 150 mg via ORAL
  Filled 2022-11-21 (×4): qty 1

## 2022-11-21 MED ORDER — PANTOPRAZOLE SODIUM 40 MG IV SOLR
40.0000 mg | Freq: Every day | INTRAVENOUS | Status: DC
  Filled 2022-11-21 (×2): qty 10

## 2022-11-21 MED ORDER — TETRACAINE HCL 0.5 % OP SOLN
1.0000 [drp] | Freq: Once | OPHTHALMIC | Status: AC
  Administered 2022-11-21: 1 [drp] via OPHTHALMIC
  Filled 2022-11-21: qty 4

## 2022-11-21 MED ORDER — DOCUSATE SODIUM 100 MG PO CAPS
100.0000 mg | ORAL_CAPSULE | Freq: Two times a day (BID) | ORAL | Status: DC
  Administered 2022-11-21 – 2022-11-22 (×2): 100 mg via ORAL
  Filled 2022-11-21 (×3): qty 1

## 2022-11-21 MED ORDER — PROCHLORPERAZINE MALEATE 10 MG PO TABS: 10.0000 mg | ORAL_TABLET | Freq: Four times a day (QID) | ORAL | Status: DC | PRN

## 2022-11-21 MED ORDER — PROCHLORPERAZINE EDISYLATE 10 MG/2ML IJ SOLN: 5.0000 mg | Freq: Four times a day (QID) | INTRAMUSCULAR | Status: DC | PRN

## 2022-11-21 MED ORDER — LIDOCAINE-EPINEPHRINE (PF) 2 %-1:200000 IJ SOLN
10.0000 mL | Freq: Once | INTRAMUSCULAR | Status: AC
  Administered 2022-11-21: 10 mL via INTRADERMAL
  Filled 2022-11-21: qty 20

## 2022-11-21 MED ORDER — VALSARTAN-HYDROCHLOROTHIAZIDE 160-25 MG PO TABS: 1.0000 | ORAL_TABLET | Freq: Every day | ORAL | Status: DC

## 2022-11-21 MED ORDER — LORAZEPAM 1 MG PO TABS: 1.0000 mg | ORAL_TABLET | ORAL | Status: DC | PRN

## 2022-11-21 MED ORDER — INSULIN ASPART 100 UNIT/ML IJ SOLN
0.0000 [IU] | Freq: Three times a day (TID) | INTRAMUSCULAR | Status: DC
  Administered 2022-11-21: 2 [IU] via SUBCUTANEOUS
  Administered 2022-11-22 (×2): 3 [IU] via SUBCUTANEOUS
  Administered 2022-11-23: 2 [IU] via SUBCUTANEOUS
  Filled 2022-11-21: qty 0.15

## 2022-11-21 MED ORDER — METHOCARBAMOL 500 MG PO TABS: 500.0000 mg | ORAL_TABLET | Freq: Three times a day (TID) | ORAL | Status: DC | PRN
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
## 2022-11-21 NOTE — ED Notes (Signed)
Patient transported to CT 
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
## 2022-11-22 ENCOUNTER — Other Ambulatory Visit: Payer: Self-pay

## 2022-11-22 ENCOUNTER — Encounter (HOSPITAL_COMMUNITY): Payer: Self-pay

## 2022-11-22 DIAGNOSIS — Z7984 Long term (current) use of oral hypoglycemic drugs: Secondary | ICD-10-CM | POA: Diagnosis not present

## 2022-11-22 DIAGNOSIS — S020XXA Fracture of vault of skull, initial encounter for closed fracture: Secondary | ICD-10-CM | POA: Diagnosis not present

## 2022-11-22 DIAGNOSIS — S299XXA Unspecified injury of thorax, initial encounter: Secondary | ICD-10-CM | POA: Diagnosis not present

## 2022-11-22 DIAGNOSIS — S0591XA Unspecified injury of right eye and orbit, initial encounter: Secondary | ICD-10-CM | POA: Diagnosis present

## 2022-11-22 DIAGNOSIS — S065X0A Traumatic subdural hemorrhage without loss of consciousness, initial encounter: Secondary | ICD-10-CM | POA: Diagnosis not present

## 2022-11-22 DIAGNOSIS — Z79899 Other long term (current) drug therapy: Secondary | ICD-10-CM | POA: Diagnosis not present

## 2022-11-22 DIAGNOSIS — Z794 Long term (current) use of insulin: Secondary | ICD-10-CM | POA: Diagnosis not present

## 2022-11-22 DIAGNOSIS — S0292XA Unspecified fracture of facial bones, initial encounter for closed fracture: Secondary | ICD-10-CM | POA: Diagnosis not present

## 2022-11-22 DIAGNOSIS — E113593 Type 2 diabetes mellitus with proliferative diabetic retinopathy without macular edema, bilateral: Secondary | ICD-10-CM | POA: Diagnosis not present

## 2022-11-22 DIAGNOSIS — S01111A Laceration without foreign body of right eyelid and periocular area, initial encounter: Secondary | ICD-10-CM | POA: Diagnosis not present

## 2022-11-22 DIAGNOSIS — H0520 Unspecified exophthalmos: Secondary | ICD-10-CM | POA: Diagnosis not present

## 2022-11-22 DIAGNOSIS — I10 Essential (primary) hypertension: Secondary | ICD-10-CM | POA: Diagnosis not present

## 2022-11-22 LAB — COMPREHENSIVE METABOLIC PANEL
ALT: 17 U/L (ref 0–44)
AST: 22 U/L (ref 15–41)
Albumin: 3.1 g/dL — ABNORMAL LOW (ref 3.5–5.0)
Alkaline Phosphatase: 87 U/L (ref 38–126)
Anion gap: 6 (ref 5–15)
BUN: 14 mg/dL (ref 6–20)
CO2: 23 mmol/L (ref 22–32)
Calcium: 8.2 mg/dL — ABNORMAL LOW (ref 8.9–10.3)
Chloride: 108 mmol/L (ref 98–111)
Creatinine, Ser: 1.24 mg/dL (ref 0.61–1.24)
GFR, Estimated: >60 mL/min (ref >60)
Glucose, Bld: 143 mg/dL — ABNORMAL HIGH (ref 70–99)
Potassium: 3.4 mmol/L — ABNORMAL LOW (ref 3.5–5.1)
Sodium: 137 mmol/L (ref 135–145)
Total Bilirubin: 1 mg/dL (ref 0.3–1.2)
Total Protein: 7.4 g/dL (ref 6.5–8.1)

## 2022-11-22 LAB — HEMOGLOBIN A1C
Hgb A1c MFr Bld: 6.9 % — ABNORMAL HIGH (ref 4.8–5.6)
Mean Plasma Glucose: 151 mg/dL

## 2022-11-22 LAB — CBC
HCT: 34.9 % — ABNORMAL LOW (ref 39.0–52.0)
Hemoglobin: 11.4 g/dL — ABNORMAL LOW (ref 13.0–17.0)
MCH: 31.6 pg (ref 26.0–34.0)
MCHC: 32.7 g/dL (ref 30.0–36.0)
MCV: 96.7 fL (ref 80.0–100.0)
Platelets: 222 10*3/uL (ref 150–400)
RBC: 3.61 MIL/uL — ABNORMAL LOW (ref 4.22–5.81)
RDW: 15.5 % (ref 11.5–15.5)
WBC: 8.3 10*3/uL (ref 4.0–10.5)
nRBC: 0 % (ref 0.0–0.2)

## 2022-11-22 LAB — CBG MONITORING, ED
Glucose-Capillary: 163 mg/dL — ABNORMAL HIGH (ref 70–99)
Glucose-Capillary: 154 mg/dL — ABNORMAL HIGH (ref 70–99)
Glucose-Capillary: 115 mg/dL — ABNORMAL HIGH (ref 70–99)
Glucose-Capillary: 110 mg/dL — ABNORMAL HIGH (ref 70–99)

## 2022-11-22 MED ORDER — MORPHINE SULFATE (PF) 2 MG/ML IV SOLN: 2.0000 mg | INTRAVENOUS | Status: DC | PRN

## 2022-11-22 MED ORDER — POTASSIUM CHLORIDE CRYS ER 20 MEQ PO TBCR
40.0000 meq | EXTENDED_RELEASE_TABLET | Freq: Once | ORAL | Status: AC
  Administered 2022-11-22: 40 meq via ORAL
  Filled 2022-11-22: qty 2

## 2022-11-22 MED ORDER — METHOCARBAMOL 750 MG PO TABS: 750.0000 mg | ORAL_TABLET | Freq: Four times a day (QID) | ORAL | Status: DC | PRN

## 2022-11-22 MED ORDER — ACETAMINOPHEN 500 MG PO TABS
1000.0000 mg | ORAL_TABLET | Freq: Three times a day (TID) | ORAL | Status: DC
  Administered 2022-11-22 – 2022-11-23 (×4): 1000 mg via ORAL
  Filled 2022-11-22 (×4): qty 2

## 2022-11-22 MED ORDER — OXYCODONE HCL 5 MG PO TABS
5.0000 mg | ORAL_TABLET | ORAL | Status: DC | PRN
  Filled 2022-11-22: qty 2

## 2022-11-22 NOTE — Evaluation (Signed)
SLP Cancellation Note  Patient Details Name: ERVIE MCCARD MRN: [id number] DOB: Sep 24, 1961   Cancelled treatment:       Reason Eval/Treat Not Completed: Other (comment) (pt being transferred to [facility])   Macario Golds 11/22/2022, 10:44 AM  Kathleen Lime, MS [facility] SLP Acute Rehab Services Office [phone] Pager [phone]
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
## 2022-11-22 NOTE — ED Notes (Signed)
Pt arrived from EMS with transfer from [facility]. Assumed care
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
## 2022-11-22 NOTE — ED Notes (Signed)
[facility] came to transfer pt ED to ED.
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
## 2022-11-22 NOTE — Progress Notes (Signed)
PT Cancellation Note  Patient Details Name: Jesse [name] MRN: [id number] DOB: September 03, 1961   Cancelled Treatment:    Reason Eval/Treat Not Completed: Other (comment). Patient being transferred to [facility].  [place] Office [phone] Weekend [phone]    Claretha Cooper 11/22/2022, 9:38 AM
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
## 2022-11-23 LAB — BASIC METABOLIC PANEL
Anion gap: 11 (ref 5–15)
BUN: 11 mg/dL (ref 6–20)
CO2: 24 mmol/L (ref 22–32)
Calcium: 8.6 mg/dL — ABNORMAL LOW (ref 8.9–10.3)
Chloride: 100 mmol/L (ref 98–111)
Creatinine, Ser: 1.11 mg/dL (ref 0.61–1.24)
GFR, Estimated: >60 mL/min (ref >60)
Glucose, Bld: 114 mg/dL — ABNORMAL HIGH (ref 70–99)
Potassium: 3 mmol/L — ABNORMAL LOW (ref 3.5–5.1)
Sodium: 135 mmol/L (ref 135–145)

## 2022-11-23 LAB — CBC
HCT: 36.5 % — ABNORMAL LOW (ref 39.0–52.0)
Hemoglobin: 12.1 g/dL — ABNORMAL LOW (ref 13.0–17.0)
MCH: 31.7 pg (ref 26.0–34.0)
MCHC: 33.2 g/dL (ref 30.0–36.0)
MCV: 95.5 fL (ref 80.0–100.0)
Platelets: 238 10*3/uL (ref 150–400)
RBC: 3.82 MIL/uL — ABNORMAL LOW (ref 4.22–5.81)
RDW: 15.5 % (ref 11.5–15.5)
WBC: 8.1 10*3/uL (ref 4.0–10.5)
nRBC: 0 % (ref 0.0–0.2)

## 2022-11-23 LAB — GLUCOSE, CAPILLARY
Glucose-Capillary: 135 mg/dL — ABNORMAL HIGH (ref 70–99)
Glucose-Capillary: 120 mg/dL — ABNORMAL HIGH (ref 70–99)

## 2022-11-23 MED ORDER — ACETAMINOPHEN 500 MG PO TABS: 1000.0000 mg | ORAL_TABLET | Freq: Three times a day (TID) | ORAL | 0 refills | Status: DC

## 2022-11-23 MED ORDER — POLYETHYLENE GLYCOL 3350 17 G PO PACK: 17.0000 g | PACK | Freq: Every day | ORAL | 0 refills | Status: DC | PRN

## 2022-11-23 MED ORDER — DORZOLAMIDE HCL-TIMOLOL MAL 2-0.5 % OP SOLN: 1.0000 [drp] | Freq: Two times a day (BID) | OPHTHALMIC | 0 refills | Status: AC

## 2022-11-23 MED ORDER — OXYCODONE HCL 5 MG PO TABS: 5.0000 mg | ORAL_TABLET | Freq: Three times a day (TID) | ORAL | 0 refills | Status: DC | PRN

## 2022-11-23 MED ORDER — METHOCARBAMOL 750 MG PO TABS: 750.0000 mg | ORAL_TABLET | Freq: Four times a day (QID) | ORAL | 0 refills | Status: DC | PRN

## 2022-11-23 MED ORDER — LEVETIRACETAM 500 MG PO TABS: 500.0000 mg | ORAL_TABLET | Freq: Two times a day (BID) | ORAL | 0 refills | Status: DC

## 2022-11-23 MED ORDER — ENOXAPARIN SODIUM 30 MG/0.3ML IJ SOSY
30.0000 mg | PREFILLED_SYRINGE | Freq: Two times a day (BID) | INTRAMUSCULAR | Status: DC
  Administered 2022-11-23: 30 mg via SUBCUTANEOUS
  Filled 2022-11-23: qty 0.3
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
## 2022-11-23 NOTE — ED Notes (Signed)
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 mg (has no administration in time range)    Or  prochlorperazine (COMPAZINE) injection 5-10 mg (has no administration in time range)  docusate sodium (COLACE) capsule 100 mg (100 mg Oral Patient Refused/Not Given 11/22/22 2159)  bisacodyl (DULCOLAX) suppository 10 mg (has no administration in time range)  polyethylene glycol (MIRALAX / GLYCOLAX) packet 17 g (has no administration in time range)  LORazepam (ATIVAN) tablet 1-4 mg (has no administration in time range)  thiamine (VITAMIN B1) tablet 100 mg (100 mg Oral Given [id number])  folic acid (FOLVITE) tablet 1 mg (1 mg Oral Given 11/22/22 0934)  multivitamin with minerals tablet 1 tablet (1 tablet Oral Given 11/22/22 0934)  insulin aspart (novoLOG) injection 0-15 Units ( Subcutaneous Not Given 11/22/22 1726)  insulin aspart (novoLOG) injection 0-5 Units ( Subcutaneous Not Given 11/22/22 2151)  levETIRAcetam (KEPPRA) tablet 500 mg  (500 mg Oral Given 11/22/22 2157)  irbesartan (AVAPRO) tablet 150 mg (150 mg Oral Given 11/22/22 0938)    And  hydrochlorothiazide (HYDRODIURIL) tablet 25 mg (25 mg Oral Given 11/22/22 0934)  oxyCODONE (Oxy IR/ROXICODONE) immediate release tablet 5-10 mg (has no administration in time range)  morphine (PF) 2 MG/ML injection 2 mg (has no administration in time range)  acetaminophen (TYLENOL) tablet 1,000 mg (1,000 mg Oral Given 11/22/22 2157)  methocarbamol (ROBAXIN) tablet 750 mg (has no administration in time range)  tetracaine (PONTOCAINE) 0.5 % ophthalmic solution 1 drop (1 drop Right Eye Given 11/21/22 0649)  fluorescein ophthalmic strip 1 strip (1 strip Right Eye Given 11/21/22 0650)  lidocaine-EPINEPHrine (XYLOCAINE W/EPI) 2 %-1:200000 (PF) injection 10 mL (10 mLs Intradermal Given by Other 11/21/22 1000)  morphine (PF) 4 MG/ML injection 4 mg (4 mg Intravenous Given 11/21/22 0904)  sodium chloride (PF) 0.9 % injection (  Given by Other 11/21/22 0800)  iohexol (OMNIPAQUE) 300 MG/ML solution 100 mL (100 mLs Intravenous Contrast Given 11/21/22 0806)  dorzolamide-timolol (COSOPT) 2-0.5 % ophthalmic solution 1 drop (1 drop Right Eye Given 11/21/22 0925)  sodium chloride (PF) 0.9 % injection (  Given by Other 11/21/22 0845)  potassium chloride SA (KLOR-CON M) CR tablet 40 mEq (40 mEq Oral Given 11/22/22 1033)    Mobility walks Low fall risk   Focused Assessments Cardiac Assessment Handoff:  Cardiac Rhythm: Normal sinus rhythm Lab Results  Component Value Date   CKTOTAL 128 07/04/2022   No results found for: "DDIMER" Does the Patient currently have chest pain? No   , Neuro Assessment Handoff:  Swallow screen pass? Yes  Cardiac Rhythm: Normal sinus rhythm NIH Stroke Scale  Dizziness Present: No Headache Present: No Interval: Shift assessment Level of Consciousness (1a.)   : Alert, keenly responsive LOC Questions (1b. )   : Answers both questions correctly LOC Commands (1c. )   : Performs  both tasks correctly Best Gaze (2. )  : Normal Visual (3. )  : No visual loss Facial Palsy (4. )    : Normal symmetrical movements Motor Arm, Left (5a. )   : No drift Motor Arm, Right (5b. ) : No drift Motor Leg, Left (6a. )  : No drift Motor Leg, Right (6b. ) : No drift Limb Ataxia (7. ): Absent Sensory (8. )  : Normal, no sensory loss Best Language (9. )  : No aphasia  Dysarthria (10. ): Normal Extinction/Inattention (11.)   : No Abnormality Complete NIHSS TOTAL: 0     Neuro Assessment: Within Defined Limits Neuro Checks:   Shift assessment (11/21/22 1600)  Has TPA been given? No If patient is a Neuro Trauma and patient is going to OR before floor call report to 4N Charge nurse: [phone] or [phone]  , Pulmonary Assessment Handoff:  Lung sounds:   O2 Device: Room Air      R Recommendations: See Admitting Provider Note  Report given to:   Additional Notes:
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
## 2022-11-23 NOTE — ED Notes (Signed)
This RN made aware by floor RN that room upstairs is not ready yet and awaiting a bed. Patient will be able to go upstairs pending new bed.

## 2022-11-23 NOTE — Plan of Care (Signed)
  Problem: Education: Goal: Ability to describe self-care measures that may prevent or decrease complications (Diabetes Survival Skills Education) will improve Outcome: Progressing   Problem: Fluid Volume: Goal: Ability to maintain a balanced intake and output will improve Outcome: Progressing   Problem: Nutritional: Goal: Maintenance of adequate nutrition will improve Outcome: Progressing   Problem: Coping: Goal: Ability to adjust to condition or change in health will improve Outcome: Not Progressing   Problem: Health Behavior/Discharge Planning: Goal: Ability to identify and utilize available resources and services will improve Outcome: Not Progressing   Problem: Metabolic: Goal: Ability to maintain appropriate glucose levels will improve Outcome: Not Progressing   Problem: Skin Integrity: Goal: Risk for impaired skin integrity will decrease Outcome: Not Progressing
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
## 2022-11-23 NOTE — TOC Progression Note (Signed)
Transition of Care [facility]) - Progression Note    Patient Details  Name: Jesse [name] MRN: [id number] Date of Birth: 02-19-1961  Transition [facility] Care [facility]) CM/SW Contact  Carles Collet, RN Phone Number: 11/23/2022, 10:08 AM  Clinical Narrative:     Unable to obtain [facility] services for patient after checking with agencies. A referral has been made electronically for PT OT SLP at [facility].  This is added to AVS w instructions to call Wednesday to schedule an appointment if the patient does not hear from the office by then.  Expected Discharge Plan: Home/Self Care (outpatient PT OT possible SLP) Barriers to Discharge: Continued Medical Work up  Expected Discharge Plan and Services       Living arrangements for the past 2 months: Apartment                                       Social Determinants of Health (SDOH) Interventions SDOH Screenings   Depression (PHQ2-9): Medium Risk (10/28/2022)  Tobacco Use: Low Risk  (11/22/2022)    Readmission Risk Interventions     No data to display

## 2022-11-25 ENCOUNTER — Ambulatory Visit (INDEPENDENT_AMBULATORY_CARE_PROVIDER_SITE_OTHER): Payer: Medicaid Other | Admitting: Primary Care

## 2022-11-27 ENCOUNTER — Ambulatory Visit: Payer: Medicaid Other | Admitting: Family

## 2022-11-28 ENCOUNTER — Ambulatory Visit: Payer: Medicaid Other | Admitting: Physical Therapy

## 2022-11-28 ENCOUNTER — Encounter: Payer: Medicaid Other | Admitting: Speech Pathology

## 2022-11-28 ENCOUNTER — Encounter: Payer: Medicaid Other | Admitting: Occupational Therapy
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
## 2022-11-30 ENCOUNTER — Emergency Department (HOSPITAL_COMMUNITY)
Admission: EM | Admit: 2022-11-30 | Discharge: 2022-11-30 | Disposition: A | Discharge status: Home / Self Care | Payer: Medicaid Other | Attending: Emergency Medicine | Admitting: Emergency Medicine

## 2022-11-30 ENCOUNTER — Other Ambulatory Visit: Payer: Self-pay

## 2022-11-30 ENCOUNTER — Encounter (HOSPITAL_COMMUNITY): Payer: Self-pay | Admitting: *Deleted

## 2022-11-30 DIAGNOSIS — H5711 Ocular pain, right eye: Secondary | ICD-10-CM | POA: Diagnosis present

## 2022-11-30 MED ORDER — ERYTHROMYCIN 5 MG/GM OP OINT: TOPICAL_OINTMENT | OPHTHALMIC | 0 refills | Status: DC

## 2022-11-30 NOTE — Discharge Instructions (Addendum)
You were seen today for eye pain.  We did not visualize any corneal abrasion, you will likely have improvement of your symptoms with erythromycin eye ointment for lubrication until you are seen by your ophthalmologist on Monday and they can provide further recommendations.
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
## 2022-12-02 ENCOUNTER — Emergency Department (HOSPITAL_COMMUNITY)
Admission: EM | Admit: 2022-12-02 | Discharge: 2022-12-02 | Disposition: A | Discharge status: Home / Self Care | Payer: Medicaid Other | Attending: Emergency Medicine | Admitting: Emergency Medicine

## 2022-12-02 ENCOUNTER — Encounter (HOSPITAL_COMMUNITY): Payer: Self-pay

## 2022-12-02 ENCOUNTER — Other Ambulatory Visit: Payer: Self-pay

## 2022-12-02 ENCOUNTER — Telehealth: Payer: Self-pay | Admitting: Primary Care

## 2022-12-02 DIAGNOSIS — H5789 Other specified disorders of eye and adnexa: Secondary | ICD-10-CM | POA: Diagnosis not present

## 2022-12-02 DIAGNOSIS — Z794 Long term (current) use of insulin: Secondary | ICD-10-CM | POA: Diagnosis not present

## 2022-12-02 DIAGNOSIS — I10 Essential (primary) hypertension: Secondary | ICD-10-CM | POA: Insufficient documentation

## 2022-12-02 DIAGNOSIS — Z7984 Long term (current) use of oral hypoglycemic drugs: Secondary | ICD-10-CM | POA: Diagnosis not present

## 2022-12-02 DIAGNOSIS — E119 Type 2 diabetes mellitus without complications: Secondary | ICD-10-CM | POA: Insufficient documentation

## 2022-12-02 LAB — CBC WITH DIFFERENTIAL/PLATELET
Abs Immature Granulocytes: 0.01 10*3/uL (ref 0.00–0.07)
Basophils Absolute: 0 10*3/uL (ref 0.0–0.1)
Basophils Relative: 1 %
Eosinophils Absolute: 0.1 10*3/uL (ref 0.0–0.5)
Eosinophils Relative: 2 %
HCT: 36.7 % — ABNORMAL LOW (ref 39.0–52.0)
Hemoglobin: 11.8 g/dL — ABNORMAL LOW (ref 13.0–17.0)
Immature Granulocytes: 0 %
Lymphocytes Relative: 23 %
Lymphs Abs: 1.2 10*3/uL (ref 0.7–4.0)
MCH: 31.5 pg (ref 26.0–34.0)
MCHC: 32.2 g/dL (ref 30.0–36.0)
MCV: 97.9 fL (ref 80.0–100.0)
Monocytes Absolute: 0.6 10*3/uL (ref 0.1–1.0)
Monocytes Relative: 10 %
Neutro Abs: 3.5 10*3/uL (ref 1.7–7.7)
Neutrophils Relative %: 64 %
Platelets: 279 10*3/uL (ref 150–400)
RBC: 3.75 MIL/uL — ABNORMAL LOW (ref 4.22–5.81)
RDW: 14.5 % (ref 11.5–15.5)
WBC: 5.4 10*3/uL (ref 4.0–10.5)
nRBC: 0 % (ref 0.0–0.2)

## 2022-12-02 LAB — BASIC METABOLIC PANEL
Anion gap: 12 (ref 5–15)
BUN: 13 mg/dL (ref 6–20)
CO2: 20 mmol/L — ABNORMAL LOW (ref 22–32)
Calcium: 9.2 mg/dL (ref 8.9–10.3)
Chloride: 104 mmol/L (ref 98–111)
Creatinine, Ser: 1.25 mg/dL — ABNORMAL HIGH (ref 0.61–1.24)
GFR, Estimated: >60 mL/min (ref >60)
Glucose, Bld: 163 mg/dL — ABNORMAL HIGH (ref 70–99)
Potassium: 4 mmol/L (ref 3.5–5.1)
Sodium: 136 mmol/L (ref 135–145)

## 2022-12-02 MED ORDER — ERYTHROMYCIN 5 MG/GM OP OINT: TOPICAL_OINTMENT | OPHTHALMIC | 0 refills | Status: DC
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
## 2022-12-02 NOTE — Discharge Instructions (Addendum)
You were seen today for ongoing eye irritation. Your eye seems to be improving overall from your initial injury. Continue your eye drops and start using erythromycin ointment on the eye three times daily. Please follow up with the eye doctor as scheduled on January 31st.
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
## 2022-12-03 NOTE — Telephone Encounter (Signed)
Please contact pt and schedule  

## 2022-12-11 ENCOUNTER — Ambulatory Visit (INDEPENDENT_AMBULATORY_CARE_PROVIDER_SITE_OTHER): Payer: Medicaid Other | Admitting: Primary Care

## 2022-12-17 ENCOUNTER — Encounter: Payer: Self-pay | Admitting: Orthopedic Surgery

## 2022-12-17 ENCOUNTER — Ambulatory Visit (INDEPENDENT_AMBULATORY_CARE_PROVIDER_SITE_OTHER): Payer: Medicaid Other | Admitting: Orthopedic Surgery

## 2022-12-17 DIAGNOSIS — L98499 Non-pressure chronic ulcer of skin of other sites with unspecified severity: Secondary | ICD-10-CM

## 2022-12-17 DIAGNOSIS — I872 Venous insufficiency (chronic) (peripheral): Secondary | ICD-10-CM
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
## 2023-01-16 ENCOUNTER — Ambulatory Visit: Payer: Medicaid Other | Admitting: Orthopedic Surgery

## 2023-01-16 ENCOUNTER — Ambulatory Visit (INDEPENDENT_AMBULATORY_CARE_PROVIDER_SITE_OTHER): Payer: Medicaid Other | Admitting: Primary Care

## 2023-01-21 ENCOUNTER — Ambulatory Visit (INDEPENDENT_AMBULATORY_CARE_PROVIDER_SITE_OTHER): Payer: Medicaid Other | Admitting: Orthopedic Surgery

## 2023-01-21 DIAGNOSIS — I890 Lymphedema, not elsewhere classified: Secondary | ICD-10-CM | POA: Diagnosis not present

## 2023-01-21 DIAGNOSIS — I872 Venous insufficiency (chronic) (peripheral): Secondary | ICD-10-CM | POA: Diagnosis not present

## 2023-01-21 DIAGNOSIS — L98499 Non-pressure chronic ulcer of skin of other sites with unspecified severity: Secondary | ICD-10-CM | POA: Diagnosis not present

## 2023-01-25 LAB — AMB RESULTS CONSOLE CBG: Glucose: 177

## 2023-01-28 ENCOUNTER — Ambulatory Visit (INDEPENDENT_AMBULATORY_CARE_PROVIDER_SITE_OTHER): Payer: Medicaid Other | Admitting: Primary Care

## 2023-01-28 ENCOUNTER — Encounter (INDEPENDENT_AMBULATORY_CARE_PROVIDER_SITE_OTHER): Payer: Self-pay | Admitting: Primary Care

## 2023-01-28 ENCOUNTER — Encounter: Payer: Self-pay | Admitting: *Deleted

## 2023-01-28 ENCOUNTER — Telehealth: Payer: Self-pay

## 2023-01-28 ENCOUNTER — Telehealth: Payer: Self-pay | Admitting: *Deleted

## 2023-01-28 VITALS — BP 172/87 | HR 85 | Resp 16 | Wt 254.6 lb

## 2023-01-28 DIAGNOSIS — Z599 Problem related to housing and economic circumstances, unspecified: Secondary | ICD-10-CM

## 2023-01-28 DIAGNOSIS — I890 Lymphedema, not elsewhere classified: Secondary | ICD-10-CM | POA: Diagnosis not present

## 2023-01-28 DIAGNOSIS — I10 Essential (primary) hypertension: Secondary | ICD-10-CM

## 2023-01-28 DIAGNOSIS — I87331 Chronic 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: Secondary | ICD-10-CM

## 2023-01-28 NOTE — Progress Notes (Signed)
Jesse [name] attended a 01/25/23 [place] [employer] screening event where his b/p was 141/87 and his POCT blood sugar was 177.  He shared he has been trying to keep up with his medicines but got sick and missed his appt with his PCP, Juluis Mire, NP, so he was encouraged to call his PCP office to reschedule for f/u & for ongoing med reflls as needed. Pt reminded keeping b/p and blood sugar under control were actions he could take to help his leg heal. Pt also stated that being in the hospital caused financial issues and now he can't pay his rent, his utilities. He has got food and meds. Pt says he has gone to DSS and called the county and [place] for help with rent and utilities. He was given resource lists at the event but has been told so far that those resources are out of funding. He states he applied for disabililty approx a year ago but has still not heard back, although he has a number to call in April when the disability application person told him to call back to f/u. In-basket messages sent to PCP and Eden Lathe, care manager. Pt also asked for list of foods to eat to help his wounds heal. [facility] list of "Nutrients that help your wounds heal" list mailed to pt as requested. Consequently pt seen by PCP this same day at 12:55 and spoke to RN CM at 2:30P where she made referrals for him to [place] and Medicaid Care Management. No additional health equity team support indicated at this time.

## 2023-01-28 NOTE — Telephone Encounter (Signed)
Pt called to make sure he knew his PCP had offered to see him today and he answered he was already in her clinic, waiting to see her. Caller also notified pt that RN care manager was also already notified of his rent and other "life" needs. Caller got off phone so pt could conduct his PCP visit.

## 2023-01-28 NOTE — Patient Instructions (Addendum)
Contact GI to schedule colonoscopy [facility] [address] [place], [place] [address] [phone] Fax [phone]  Contact the Eye doctor to schedule appointment [facility] Ph# [phone] address [address]

## 2023-01-28 NOTE — Telephone Encounter (Signed)
I spoke with the patient when he was at his appointment with Juluis Mire, NP today.  He explained that he is having difficulty paying his rent because he has not been able to work enough.  He said he has a list of resources for assistance with rent and has contacted some of the agencies but has not found an agency that has funding available.  He said that his rent is $475/month , he owes February rent and March rent [place] coming due. He has not received an eviction notice but is concerned that it may be  a possibility.  He has applied for disability and needs to check on the status of that application and he said he has the phone number of the person he needs to contact.  I explained to him that I can refer him to the Managed Medicaid Care Manager to help him access his extra benefits because many of the Managed Medicaid plans have a limited benefit for rental assistance.  There is no guarantee that his insurance plan offers this but we can have the care manager help guide him. I also explained that I can refer him to [facility]) for possible assistance with addressing his situation with the landlord.  I explained that there is no guarantee that they can help but they may be able to offer him other resources in the event that they are not able to assist him,  He was very agreeable to submitting both referrals- Care Management and [facility].    Referrals then submitted to [facility] and Managed Medicaid Care Management

## 2023-01-30 ENCOUNTER — Ambulatory Visit (INDEPENDENT_AMBULATORY_CARE_PROVIDER_SITE_OTHER): Payer: Medicaid Other | Admitting: Orthopedic Surgery

## 2023-01-30 ENCOUNTER — Encounter: Payer: Self-pay | Admitting: Orthopedic Surgery

## 2023-01-30 DIAGNOSIS — L98499 Non-pressure chronic ulcer of skin of other sites with unspecified severity: Secondary | ICD-10-CM | POA: Diagnosis not present

## 2023-01-30 DIAGNOSIS — I872 Venous insufficiency (chronic) (peripheral): Secondary | ICD-10-CM | POA: Diagnosis not present

## 2023-01-30 DIAGNOSIS — I890 Lymphedema, not elsewhere classified: Secondary | ICD-10-CM | POA: Diagnosis not present

## 2023-01-30 NOTE — Progress Notes (Signed)
Office Visit Note   Patient: Jesse [name]           Date of Birth: June 13, 1961           MRN: [id number] Visit Date: 01/30/2023              Requested by: Kerin Perna, NP [address] [place],  [place] [address] PCP: Kerin Perna, NP  Chief Complaint  Patient presents with   Left Leg - Follow-up   Right Leg - Follow-up      HPI: Patient is a 62 year old gentleman with bilateral lower extremity lymphedema and venous insufficiency with history of ulceration.  Patient has been going through serial compression wraps.  He has failed over 4 weeks of conservative therapy with compression exercise and elevation.  Assessment & Plan: Visit Diagnoses:  1. Lymphedema   2. Ulcer of extremity due to chronic venous insufficiency ([place])     Plan: With patient's failure of conservative therapy we will continue wrapping both legs and patient will require lymphedema pumps.  Follow-Up Instructions: Return in about 1 week (around 02/06/2023).   Ortho Exam  Patient is alert, oriented, no adenopathy, well-dressed, normal affect, normal respiratory effort. Examination patient has brawny edema with induration both lower extremities.  There is no cellulitis drainage or ulceration today.  The ankle measurement is 32 cm in circumference on the right 33 cm in circumference on the left.  Calf circumference is 60.5 cm on the right 54.0 cm on the left.  Knee circumference is 56 cm in circumference on the right and 55 cm in circumference on the left.  Imaging: No results found. No images are attached to the encounter.  Labs: Lab Results  Component Value Date   HGBA1C 6.9 (H) 11/21/2022   HGBA1C 6.3 10/10/2022   HGBA1C 8.3 (H) 06/20/2022   ESRSEDRATE 137 (H) 07/08/2022   ESRSEDRATE 107 (H) 06/27/2022   ESRSEDRATE 108 (H) 04/08/2021   CRP 14.1 (H) 07/08/2022   CRP 16.9 (H) 06/27/2022   CRP 27.2 (H) 04/08/2021   LABURIC 5.3 07/04/2022   LABURIC 7.0 01/14/2016   REPTSTATUS  06/24/2022 FINAL 06/22/2022   GRAMSTAIN NO WBC SEEN NO ORGANISMS SEEN  04/18/2021   CULT (A) 06/22/2022    <10,000 COLONIES/mL INSIGNIFICANT GROWTH Performed at [facility], [place] [address] [place],  [address]    LABORGA PROTEUS MIRABILIS 04/18/2021     Lab Results  Component Value Date   ALBUMIN 3.1 (L) 11/22/2022   ALBUMIN 3.5 11/21/2022   ALBUMIN 2.1 (L) 07/10/2022    Lab Results  Component Value Date   MG 1.3 (L) 07/10/2022   MG 1.6 (L) 07/08/2022   MG 1.3 (L) 07/07/2022   No results found for: "VD25OH"  No results found for: "PREALBUMIN"    Latest Ref Rng & Units 12/02/2022   12:25 PM 11/23/2022    7:00 AM 11/22/2022    6:30 AM  CBC EXTENDED  WBC 4.0 - 10.5 K/uL 5.4  8.1  8.3   RBC 4.22 - 5.81 MIL/uL 3.75  3.82  3.61   Hemoglobin 13.0 - 17.0 g/dL 11.8  12.1  11.4   HCT 39.0 - 52.0 % 36.7  36.5  34.9   Platelets 150 - 400 K/uL 279  238  222   NEUT# 1.7 - 7.7 K/uL 3.5     Lymph# 0.7 - 4.0 K/uL 1.2        There is no height or weight on file to calculate  BMI.  Orders:  No orders of the defined types were placed in this encounter.  No orders of the defined types were placed in this encounter.    Procedures: No procedures performed  Clinical Data: No additional findings.  ROS:  All other systems negative, except as noted in the HPI. Review of Systems  Objective: Vital Signs: There were no vitals taken for this visit.  Specialty Comments:  No specialty comments available.  PMFS History: Patient Active Problem List   Diagnosis Date Noted   Eye injury 11/21/2022   Hyperkalemia 07/10/2022   Chronic painful diabetic neuropathy ([place]) Q000111Q   Metabolic acidosis, normal anion gap (NAG) 07/10/2022   Hypomagnesemia 07/10/2022   Charcot's arthropathy 06/28/2022   Leukocytosis    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)    Severe protein-calorie malnutrition ([place])     Diabetic foot infection ([place]) 06/22/2022   Sepsis ([place]) 06/22/2022   Cellulitis in diabetic foot ([place]) 04/17/2021   Osteomyelitis ([place]) 04/14/2021   HTN (hypertension) 04/08/2021   HLD (hyperlipidemia) 04/08/2021   Alcohol abuse    DM (diabetes mellitus) ([place]) 01/27/2014   AKI (acute kidney injury) ([place]) 01/27/2014   Past Medical History:  Diagnosis Date   AKI (acute kidney injury) ([place]) 01/27/2014   Alcohol abuse    Anasarca    Bilateral leg edema 02/07/2021   Cellulitis    Cellulitis and abscess 01/26/2014   Cellulitis of right foot 04/17/2021   Dental abscess 01/26/2014   Diabetes mellitus type 2 in obese [facility])    Diabetic foot infection ([place]) 04/08/2021   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 2 diabetes mellitus, limited to breakdown of skin ([place])    Facial cellulitis 01/26/2014   HTN (hypertension)    Hypokalemia 01/28/2014   Infected dental carries 01/26/2014   Leukocytosis 01/27/2014   Osteomyelitis ([place]) 04/14/2021   Right foot infection     History reviewed. No pertinent family history.  Past Surgical History:  Procedure Laterality Date   I & D EXTREMITY Right 04/18/2021   Procedure: IRRIGATION AND DEBRIDEMENT OF FOOT;  Surgeon: Newt Minion, MD;  Location: [place];  Service: Orthopedics;  Laterality: Right;   I & D EXTREMITY Right 04/20/2021   Procedure: EXCISIONAL DEBRIDEMENT RIGHT FOOT, APPLICATION OF SKIN GRAFT;  Surgeon: Newt Minion, MD;  Location: [facility] [place];  Service: Orthopedics;  Laterality: Right;   Social History   Occupational History   Occupation: Dealer  Tobacco Use   Smoking status: Never   Smokeless tobacco: Never  Vaping Use   Vaping Use: Never used  Substance and Sexual Activity   Alcohol use: Not Currently    Alcohol/week: 2.0 standard drinks of alcohol    Types: 2 Cans of beer per week    Comment: Occasional   Drug use: No   Sexual activity: Not Currently    Partners: Female

## 2023-02-02 NOTE — Progress Notes (Signed)
Colma, is a 62 y.o. male  [id number]  [id number]  DOB - September 22, 1961  Chief Complaint  Patient presents with   Diabetes   Hypertension       Subjective:   Jesse [name] is a 62 y.o. male here today for a follow up management of hypertension . Patient has No headache, No chest pain, No abdominal pain - No Nausea, No new weakness tingling or numbness, No Cough - shortness of breath.  Patient was little concern was housing and has tried every number he was given.  He is not working and currently not on disability he is 1 month behind in rent and this is the next month that will be due.  He has not received an eviction notice yet but is expecting one soon.  Call clinical nurse manager for direction and she provided resources and also intervene in his behalf  No problems updated.  No Known Allergies  Past Medical History:  Diagnosis Date   AKI (acute kidney injury) ([place]) 01/27/2014   Alcohol abuse    Anasarca    Bilateral leg edema 02/07/2021   Cellulitis    Cellulitis and abscess 01/26/2014   Cellulitis of right foot 04/17/2021   Dental abscess 01/26/2014   Diabetes mellitus type 2 in obese [facility])    Diabetic foot infection ([place]) 04/08/2021   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 2 diabetes mellitus, limited to breakdown of skin ([place])    Facial cellulitis 01/26/2014   HTN (hypertension)    Hypokalemia 01/28/2014   Infected dental carries 01/26/2014   Leukocytosis 01/27/2014   Osteomyelitis ([place]) 04/14/2021   Right foot infection     Current Outpatient Medications on File Prior to Visit  Medication Sig Dispense Refill   acetaminophen (TYLENOL) 500 MG tablet Take 2 tablets (1,000 mg total) by mouth every 8 (eight) hours. 30 tablet 0   dorzolamide-timolol (COSOPT) 2-0.5 % ophthalmic solution Place 1 drop into the right eye 2 (two) times daily. 10 mL 0   erythromycin ophthalmic ointment Place a 1/2 inch ribbon of ointment into  the lower eyelid three times daily. 3.5 g 0   gabapentin (NEURONTIN) 100 MG capsule Take 1 capsule (100 mg total) by mouth 3 (three) times daily. (Patient not taking: Reported on 11/21/2022) 90 capsule 0   insulin glargine (LANTUS) 100 UNIT/ML Solostar Pen INJECT 10 UNITS INTO THE SKIN DAILY. (Patient not taking: Reported on 11/21/2022) 6 mL 3   Insulin Pen Needle 32G X 4 MM MISC Use as directed with insulin pen (Patient not taking: Reported on 10/28/2022) 100 each 1   levETIRAcetam (KEPPRA) 500 MG tablet Take 1 tablet (500 mg total) by mouth 2 (two) times daily for 5 days. 10 tablet 0   methocarbamol (ROBAXIN) 750 MG tablet Take 1 tablet (750 mg total) by mouth every 6 (six) hours as needed for muscle spasms. 30 tablet 0   Nystatin (GERHARDT'S BUTT CREAM) CREA Apply 1 Application topically 3 (three) times daily. Apply to inner thighs and buttocks liberally (Patient not taking: Reported on 11/21/2022) 30 each 2   oxyCODONE (OXY IR/ROXICODONE) 5 MG immediate release tablet Take 1 tablet (5 mg total) by mouth every 8 (eight) hours as needed for moderate pain or severe pain. 10 tablet 0   polyethylene glycol (MIRALAX / GLYCOLAX) 17 g packet Take 17 g by mouth daily as needed for moderate constipation. 14 each 0   thiamine (VITAMIN B1) 100 MG tablet Take  1 tablet (100 mg total) by mouth daily. (Patient not taking: Reported on 11/21/2022) 30 tablet 0   valsartan-hydrochlorothiazide (DIOVAN-HCT) 160-25 MG tablet Take 1 tablet by mouth daily. (Patient not taking: Reported on 11/21/2022) 90 tablet 3   [DISCONTINUED] glipiZIDE (GLUCOTROL) 5 MG tablet Take 1 tablet (5 mg total) by mouth daily before breakfast. (Patient not taking: Reported on 01/14/2016) 30 tablet 0   No current facility-administered medications on file prior to visit.    Objective:   Vitals:   01/28/23 1252 01/28/23 1253  BP: (Abnormal) 162/83 (Abnormal) 172/87  Pulse: 85   Resp: 16   SpO2: 99%   Weight: 254 lb 9.6 oz (115.5 kg)      Comprehensive ROS Pertinent positive and negative noted in HPI   Exam General appearance : Awake, alert, not in any distress. Speech Clear. Not toxic looking HEENT: Atraumatic and Normocephalic, pupils equally reactive to light and accomodation Neck: Supple, no JVD. No cervical lymphadenopathy.  Chest: Good air entry bilaterally, no added sounds  CVS: S1 S2 regular, no murmurs.  Abdomen: Bowel sounds present, Non tender and not distended with no gaurding, rigidity or rebound. Extremities: Bilateral lymphedema wrapped-rides a moped to get to appointment Neurology: Awake alert, and oriented X 3,  Non focal Skin: No Rash  Data Review Lab Results  Component Value Date   HGBA1C 6.9 (H) 11/21/2022   HGBA1C 6.3 10/10/2022   HGBA1C 8.3 (H) 06/20/2022    Assessment & Plan  Saam was seen today for diabetes and hypertension.  Diagnoses and all orders for this visit:  Lymphatic edema Followed by Dr. Sharol Given  Primary hypertension BP goal - < 140/90 Explained that having normal blood pressure is the goal and medications are helping to get to goal and maintain normal blood pressure. DIET: Limit salt intake, read nutrition labels to check salt content, limit fried and high fatty foods  Avoid using multisymptom OTC cold preparations that generally contain sudafed which can rise BP. Consult with pharmacist on best cold relief products to use for persons with HTN EXERCISE Discussed incorporating exercise such as walking - 30 minutes most days of the week and can do in 10 minute intervals     Economic hardship  refer to HPI  Patient have been counseled extensively about nutrition and exercise. Other issues discussed during this visit include: low cholesterol diet, weight control and daily exercise, foot care, annual eye examinations at Ophthalmology, importance of adherence with medications and regular follow-up. We also discussed long term complications of uncontrolled diabetes and  hypertension.   No follow-ups on file.  The patient was given clear instructions to go to ER or return to medical center if symptoms don't improve, worsen or new problems develop. The patient verbalized understanding. The patient was told to call to get lab results if they haven't heard anything in the next week.   This note has been created with Surveyor, quantity. Any transcriptional errors are unintentional.   Kerin Perna, NP 02/02/2023, 8:42 PM

## 2023-02-03 ENCOUNTER — Encounter: Payer: Self-pay | Admitting: Orthopedic Surgery

## 2023-02-03 NOTE — Progress Notes (Signed)
Office Visit Note   Patient: Jesse [name]           Date of Birth: 1961-08-02           MRN: [id number] Visit Date: 01/21/2023              Requested by: Kerin Perna, NP [address] [place],  [place] [address] PCP: Kerin Perna, NP  Chief Complaint  Patient presents with   Right Leg - Follow-up      HPI: Patient is a 62 year old gentleman who presents in follow-up for venous and lymphatic insufficiency both lower extremities.  Assessment & Plan: Visit Diagnoses:  1. Lymphedema   2. Ulcer of extremity due to chronic venous insufficiency (HCC)     Plan: Continue with compression elevation and exercise.  Follow-Up Instructions: Return in about 4 weeks (around 02/18/2023).   Ortho Exam  Patient is alert, oriented, no adenopathy, well-dressed, normal affect, normal respiratory effort. Examination the right calf measures 60 cm in circumference the left calf is 53 cm in circumference there were no open ulcers no cellulitis no drainage.  Imaging: No results found. No images are attached to the encounter.  Labs: Lab Results  Component Value Date   HGBA1C 6.9 (H) 11/21/2022   HGBA1C 6.3 10/10/2022   HGBA1C 8.3 (H) 06/20/2022   ESRSEDRATE 137 (H) 07/08/2022   ESRSEDRATE 107 (H) 06/27/2022   ESRSEDRATE 108 (H) 04/08/2021   CRP 14.1 (H) 07/08/2022   CRP 16.9 (H) 06/27/2022   CRP 27.2 (H) 04/08/2021   LABURIC 5.3 07/04/2022   LABURIC 7.0 01/14/2016   REPTSTATUS 06/24/2022 FINAL 06/22/2022   GRAMSTAIN NO WBC SEEN NO ORGANISMS SEEN  04/18/2021   CULT (A) 06/22/2022    <10,000 COLONIES/mL INSIGNIFICANT GROWTH Performed at [facility], [place] [address] [place], [place] [address]    LABORGA PROTEUS MIRABILIS 04/18/2021     Lab Results  Component Value Date   ALBUMIN 3.1 (L) 11/22/2022   ALBUMIN 3.5 11/21/2022   ALBUMIN 2.1 (L) 07/10/2022    Lab Results  Component Value Date   MG 1.3 (L) 07/10/2022   MG 1.6 (L) 07/08/2022   MG 1.3  (L) 07/07/2022   No results found for: "VD25OH"  No results found for: "PREALBUMIN"    Latest Ref Rng & Units 12/02/2022   12:25 PM 11/23/2022    7:00 AM 11/22/2022    6:30 AM  CBC EXTENDED  WBC 4.0 - 10.5 K/uL 5.4  8.1  8.3   RBC 4.22 - 5.81 MIL/uL 3.75  3.82  3.61   Hemoglobin 13.0 - 17.0 g/dL 11.8  12.1  11.4   HCT 39.0 - 52.0 % 36.7  36.5  34.9   Platelets 150 - 400 K/uL 279  238  222   NEUT# 1.7 - 7.7 K/uL 3.5     Lymph# 0.7 - 4.0 K/uL 1.2        There is no height or weight on file to calculate BMI.  Orders:  No orders of the defined types were placed in this encounter.  No orders of the defined types were placed in this encounter.    Procedures: No procedures performed  Clinical Data: No additional findings.  ROS:  All other systems negative, except as noted in the HPI. Review of Systems  Objective: Vital Signs: There were no vitals taken for this visit.  Specialty Comments:  No specialty comments available.  PMFS History: Patient Active Problem List   Diagnosis Date  Noted   Eye injury 11/21/2022   Hyperkalemia 07/10/2022   Chronic painful diabetic neuropathy ([place]) Q000111Q   Metabolic acidosis, normal anion gap (NAG) 07/10/2022   Hypomagnesemia 07/10/2022   Charcot's arthropathy 06/28/2022   Leukocytosis    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)    Severe protein-calorie malnutrition ([place])    Diabetic foot infection ([place]) 06/22/2022   Sepsis ([place]) 06/22/2022   Cellulitis in diabetic foot ([place]) 04/17/2021   Osteomyelitis ([place]) 04/14/2021   HTN (hypertension) 04/08/2021   HLD (hyperlipidemia) 04/08/2021   Alcohol abuse    DM (diabetes mellitus) ([place]) 01/27/2014   AKI (acute kidney injury) ([place]) 01/27/2014   Past Medical History:  Diagnosis Date   AKI (acute kidney injury) ([place]) 01/27/2014   Alcohol abuse    Anasarca    Bilateral leg edema 02/07/2021   Cellulitis    Cellulitis and  abscess 01/26/2014   Cellulitis of right foot 04/17/2021   Dental abscess 01/26/2014   Diabetes mellitus type 2 in obese [facility])    Diabetic foot infection ([place]) 04/08/2021   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 2 diabetes mellitus, limited to breakdown of skin ([place])    Facial cellulitis 01/26/2014   HTN (hypertension)    Hypokalemia 01/28/2014   Infected dental carries 01/26/2014   Leukocytosis 01/27/2014   Osteomyelitis ([place]) 04/14/2021   Right foot infection     History reviewed. No pertinent family history.  Past Surgical History:  Procedure Laterality Date   I & D EXTREMITY Right 04/18/2021   Procedure: IRRIGATION AND DEBRIDEMENT OF FOOT;  Surgeon: Newt Minion, MD;  Location: [facility];  Service: Orthopedics;  Laterality: Right;   I & D EXTREMITY Right 04/20/2021   Procedure: EXCISIONAL DEBRIDEMENT RIGHT FOOT, APPLICATION OF SKIN GRAFT;  Surgeon: Newt Minion, MD;  Location: [place];  Service: Orthopedics;  Laterality: Right;   Social History   Occupational History   Occupation: Dealer  Tobacco Use   Smoking status: Never   Smokeless tobacco: Never  Vaping Use   Vaping Use: Never used  Substance and Sexual Activity   Alcohol use: Not Currently    Alcohol/week: 2.0 standard drinks of alcohol    Types: 2 Cans of beer per week    Comment: Occasional   Drug use: No   Sexual activity: Not Currently    Partners: Female

## 2023-02-04 ENCOUNTER — Ambulatory Visit: Payer: Medicaid Other

## 2023-02-05 ENCOUNTER — Encounter: Payer: Self-pay | Admitting: Family

## 2023-02-05 ENCOUNTER — Ambulatory Visit (INDEPENDENT_AMBULATORY_CARE_PROVIDER_SITE_OTHER): Payer: Medicaid Other | Admitting: Family

## 2023-02-05 DIAGNOSIS — I872 Venous insufficiency (chronic) (peripheral): Secondary | ICD-10-CM

## 2023-02-05 DIAGNOSIS — L98499 Non-pressure chronic ulcer of skin of other sites with unspecified severity: Secondary | ICD-10-CM

## 2023-02-05 DIAGNOSIS — I890 Lymphedema, not elsewhere classified: Secondary | ICD-10-CM

## 2023-02-05 NOTE — Progress Notes (Signed)
Office Visit Note   Patient: Jesse [name]           Date of Birth: 12/11/1960           MRN: [id number] Visit Date: 02/05/2023              Requested by: Kerin Perna, NP [address] [place],  [place] [address] PCP: Kerin Perna, NP  Chief Complaint  Patient presents with   Left Leg - Follow-up   Right Leg - Follow-up      HPI: The patient is a 62 year old gentleman who is seen in follow-up.  History of bilateral lower extremity lymphedema and venous insufficiency.  He does have a history of ulcerations.  Has previously undergone serial compression wraps.  Today he is using Ace wraps for compression.  Assessment & Plan: Visit Diagnoses:  1. Lymphedema   2. Ulcer of extremity due to chronic venous insufficiency ([place])     Plan: Will proceed with lymphedema pumps.  Demo scheduled for next Tuesday in his home.  He will follow-up in 4 weeks sooner should any concerns arise before then.  Follow-Up Instructions: No follow-ups on file.   Ortho Exam  Patient is alert, oriented, no adenopathy, well-dressed, normal affect, normal respiratory effort. On examination bilateral lower extremities he has brawny skin color changes with edema and induration.  Papillomas present.  The right is much more swollen than the left.  There is no erythema warmth or drainage today no open area no impending skin breakdown  Imaging: No results found. No images are attached to the encounter.  Labs: Lab Results  Component Value Date   HGBA1C 6.9 (H) 11/21/2022   HGBA1C 6.3 10/10/2022   HGBA1C 8.3 (H) 06/20/2022   ESRSEDRATE 137 (H) 07/08/2022   ESRSEDRATE 107 (H) 06/27/2022   ESRSEDRATE 108 (H) 04/08/2021   CRP 14.1 (H) 07/08/2022   CRP 16.9 (H) 06/27/2022   CRP 27.2 (H) 04/08/2021   LABURIC 5.3 07/04/2022   LABURIC 7.0 01/14/2016   REPTSTATUS 06/24/2022 FINAL 06/22/2022   GRAMSTAIN NO WBC SEEN NO ORGANISMS SEEN  04/18/2021   CULT (A) 06/22/2022    <10,000  COLONIES/mL INSIGNIFICANT GROWTH Performed at [facility], [place] [address] [place], [place] [address]    LABORGA PROTEUS MIRABILIS 04/18/2021     Lab Results  Component Value Date   ALBUMIN 3.1 (L) 11/22/2022   ALBUMIN 3.5 11/21/2022   ALBUMIN 2.1 (L) 07/10/2022    Lab Results  Component Value Date   MG 1.3 (L) 07/10/2022   MG 1.6 (L) 07/08/2022   MG 1.3 (L) 07/07/2022   No results found for: "VD25OH"  No results found for: "PREALBUMIN"    Latest Ref Rng & Units 12/02/2022   12:25 PM 11/23/2022    7:00 AM 11/22/2022    6:30 AM  CBC EXTENDED  WBC 4.0 - 10.5 K/uL 5.4  8.1  8.3   RBC 4.22 - 5.81 MIL/uL 3.75  3.82  3.61   Hemoglobin 13.0 - 17.0 g/dL 11.8  12.1  11.4   HCT 39.0 - 52.0 % 36.7  36.5  34.9   Platelets 150 - 400 K/uL 279  238  222   NEUT# 1.7 - 7.7 K/uL 3.5     Lymph# 0.7 - 4.0 K/uL 1.2        There is no height or weight on file to calculate BMI.  Orders:  No orders of the defined types were placed in this encounter.  No orders of the defined types were placed in this encounter.    Procedures: No procedures performed  Clinical Data: No additional findings.  ROS:  All other systems negative, except as noted in the HPI. Review of Systems  Objective: Vital Signs: There were no vitals taken for this visit.  Specialty Comments:  No specialty comments available.  PMFS History: Patient Active Problem List   Diagnosis Date Noted   Eye injury 11/21/2022   Hyperkalemia 07/10/2022   Chronic painful diabetic neuropathy ([place]) Q000111Q   Metabolic acidosis, normal anion gap (NAG) 07/10/2022   Hypomagnesemia 07/10/2022   Charcot's arthropathy 06/28/2022   Leukocytosis    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)    Severe protein-calorie malnutrition ([place])    Diabetic foot infection ([place]) 06/22/2022   Sepsis ([place]) 06/22/2022   Cellulitis in diabetic foot ([place]) 04/17/2021    Osteomyelitis ([place]) 04/14/2021   HTN (hypertension) 04/08/2021   HLD (hyperlipidemia) 04/08/2021   Alcohol abuse    DM (diabetes mellitus) ([place]) 01/27/2014   AKI (acute kidney injury) ([place]) 01/27/2014   Past Medical History:  Diagnosis Date   AKI (acute kidney injury) ([place]) 01/27/2014   Alcohol abuse    Anasarca    Bilateral leg edema 02/07/2021   Cellulitis    Cellulitis and abscess 01/26/2014   Cellulitis of right foot 04/17/2021   Dental abscess 01/26/2014   Diabetes mellitus type 2 in obese [facility])    Diabetic foot infection ([place]) 04/08/2021   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 2 diabetes mellitus, limited to breakdown of skin ([place])    Facial cellulitis 01/26/2014   HTN (hypertension)    Hypokalemia 01/28/2014   Infected dental carries 01/26/2014   Leukocytosis 01/27/2014   Osteomyelitis ([place]) 04/14/2021   Right foot infection     No family history on file.  Past Surgical History:  Procedure Laterality Date   I & D EXTREMITY Right 04/18/2021   Procedure: IRRIGATION AND DEBRIDEMENT OF FOOT;  Surgeon: Newt Minion, MD;  Location: [facility];  Service: Orthopedics;  Laterality: Right;   I & D EXTREMITY Right 04/20/2021   Procedure: EXCISIONAL DEBRIDEMENT RIGHT FOOT, APPLICATION OF SKIN GRAFT;  Surgeon: Newt Minion, MD;  Location: [facility];  Service: Orthopedics;  Laterality: Right;   Social History   Occupational History   Occupation: Dealer  Tobacco Use   Smoking status: Never   Smokeless tobacco: Never  Vaping Use   Vaping Use: Never used  Substance and Sexual Activity   Alcohol use: Not Currently    Alcohol/week: 2.0 standard drinks of alcohol    Types: 2 Cans of beer per week    Comment: Occasional   Drug use: No   Sexual activity: Not Currently    Partners: Female

## 2023-02-06 ENCOUNTER — Other Ambulatory Visit: Payer: Medicaid Other

## 2023-02-06 NOTE — Patient Outreach (Signed)
Medicaid Managed Care Social Work Note  02/06/2023 Name:  Jesse [name] MRN:  [id number] DOB:  1961-06-08  Jesse [name] is an 62 y.o. year old male who is a primary patient of Kerin Perna, NP.  The [facility] Managed Care Coordination team was consulted for assistance with:  Community Resources   Jesse [name] was given information about Medicaid Managed Care Coordination team services today. Jesse [name] Patient agreed to services and verbal consent obtained.  Engaged with patient  for by telephone forinitial visit in response to referral for case management and/or care coordination services.   Assessments/Interventions:  Review of past medical history, allergies, medications, health status, including review of consultants reports, laboratory and other test data, was performed as part of comprehensive evaluation and provision of chronic care management services.  SDOH: (Social Determinant of Health) assessments and interventions performed: SDOH Interventions    Flowsheet Row Community Documentation from 01/28/2023 in [place] Telephone from 04/26/2021 in [place] Interventions Other (Comment)  [pt given food pantry listing] --  Housing Interventions Other (Comment)  [pt given housing Engineer, structural and ref sent to [place], CM for PCp] --  Transportation Interventions -- [employer]  Utilities Interventions Other (Comment)  [pt given utility assistance flyer and called DSS] --     BSW completed a telephone outreach with patient. He stated he does not have any income, his sister was paying his rent but she can no longer pay it. Patient has applied for disability and is now waiting for a decision, he does have a hearing coming up in April. Patient states he does receive foodstamps. BSW will mail patient community resources.  Advanced Directives Status:  Not addressed in this  encounter.  Care Plan                 No Known Allergies  Medications Reviewed Today     Reviewed by Suzan Slick, NP (Nurse Practitioner) on 02/05/23 at 1440  Med List Status: <None>   Medication Order Taking? Sig Documenting Provider Last Dose Status Informant  acetaminophen (TYLENOL) 500 MG tablet [id number]  Take 2 tablets (1,000 mg total) by mouth every 8 (eight) hours. Jill Alexanders, PA-C  Active   dorzolamide-timolol (COSOPT) 2-0.5 % ophthalmic solution [id number]  Place 1 drop into the right eye 2 (two) times daily. Jill Alexanders, PA-C  Active   erythromycin ophthalmic ointment [id number]  Place a 1/2 inch ribbon of ointment into the lower eyelid three times daily. Alcus Dad, MD  Active   gabapentin (NEURONTIN) 100 MG capsule [id number] No Take 1 capsule (100 mg total) by mouth 3 (three) times daily.  Patient not taking: Reported on 11/21/2022   Kerin Perna, NP Not Taking Expired 12/20/22 2359 Self  Patient not taking:  Discontinued 01/14/16 1750   insulin glargine (LANTUS) 100 UNIT/ML Solostar Pen [id number] No INJECT 10 UNITS INTO THE SKIN DAILY.  Patient not taking: Reported on 11/21/2022   Kerin Perna, NP Not Taking Active Self  Insulin Pen Needle 32G X 4 MM MISC [id number] No Use as directed with insulin pen  Patient not taking: Reported on 10/28/2022   Mendel Corning, MD Not Taking Active Self  levETIRAcetam (KEPPRA) 500 MG tablet [id number]  Take 1 tablet (500 mg total) by mouth 2 (two) times daily for 5 days. Jill Alexanders, PA-C  Expired 11/28/22 2359   methocarbamol (ROBAXIN) 750 MG  tablet [id number]  Take 1 tablet (750 mg total) by mouth every 6 (six) hours as needed for muscle spasms. Jill Alexanders, PA-C  Active   Nystatin (GERHARDT'S BUTT CREAM) CREA [id number] No Apply 1 Application topically 3 (three) times daily. Apply to inner thighs and buttocks liberally  Patient not taking: Reported on 11/21/2022   Mendel Corning, MD Not  Taking Active Self  oxyCODONE (OXY IR/ROXICODONE) 5 MG immediate release tablet [id number]  Take 1 tablet (5 mg total) by mouth every 8 (eight) hours as needed for moderate pain or severe pain. Jill Alexanders, PA-C  Active   polyethylene glycol (MIRALAX / GLYCOLAX) 17 g packet [id number]  Take 17 g by mouth daily as needed for moderate constipation. Jill Alexanders, PA-C  Active   thiamine (VITAMIN B1) 100 MG tablet [id number] No Take 1 tablet (100 mg total) by mouth daily.  Patient not taking: Reported on 11/21/2022   Kerin Perna, NP Not Taking Active Self  valsartan-hydrochlorothiazide (DIOVAN-HCT) 160-25 MG tablet [id number] No Take 1 tablet by mouth daily.  Patient not taking: Reported on 11/21/2022   Kerin Perna, NP Not Taking Active Self            Patient Active Problem List   Diagnosis Date Noted   Eye injury 11/21/2022   Hyperkalemia 07/10/2022   Chronic painful diabetic neuropathy ([place]) [id number]   Metabolic acidosis, normal anion gap (NAG) 07/10/2022   Hypomagnesemia 07/10/2022   Charcot's arthropathy 06/28/2022   Leukocytosis    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)    Severe protein-calorie malnutrition ([place])    Diabetic foot infection ([place]) 06/22/2022   Sepsis ([place]) 06/22/2022   Cellulitis in diabetic foot ([place]) 04/17/2021   Osteomyelitis ([place]) 04/14/2021   HTN (hypertension) 04/08/2021   HLD (hyperlipidemia) 04/08/2021   Alcohol abuse    DM (diabetes mellitus) ([place]) 01/27/2014   AKI (acute kidney injury) ([place]) 01/27/2014    Conditions to be addressed/monitored per PCP order:   community resources  There are no care plans that you recently modified to display for this patient.   Follow up:  Patient agrees to Care Plan and Follow-up.  Plan: The Managed Medicaid care management team will reach out to the patient again over the next 14 days.  Date/time of next scheduled Social  Work care management/care coordination outreach:  02/26/23  Mickel Fuchs, Arita Miss, [place] Medicaid Team  [phone]

## 2023-02-06 NOTE — Patient Instructions (Signed)
Visit Information  Mr. Travers was given information about Medicaid Managed Care team care coordination services as a part of their [place] Medicaid benefit. [name] verbally consentedto engagement with the [facility] Managed Care team.   If you are experiencing a medical emergency, please call 911 or report to your local emergency department or urgent care.   If you have a non-emergency medical problem during routine business hours, please contact your provider's office and ask to speak with a nurse.   For questions related to your [facility] health plan, please call: [phone]  OR visit the member homepage at: [email or web address]  If you would like to schedule transportation through your [facility] plan, please call the following number at least 2 days in advance of your appointment: [phone]  If you are experiencing a behavioral health crisis, call the [place] at [phone]). The line is available 24 hours a day, seven days a week.  If you would like help to quit smoking, call 1-800-QUIT-NOW (760) 393-5633) OR Espaol: 1-855-Djelo-Ya HD:1601594) o para ms informacin haga clic aqu or Text READY to 200-400 to register via text  Mr. Saucerman - following are the goals we discussed in your visit today:   Goals Addressed   None      Social Worker will follow up on 02/26/23.   Mickel Fuchs, BSW, [place] Managed Medicaid Team  [phone]   Following is a copy of your plan of care:  There are no care plans that you recently modified to display for this patient.

## 2023-02-11 ENCOUNTER — Ambulatory Visit (INDEPENDENT_AMBULATORY_CARE_PROVIDER_SITE_OTHER): Payer: Medicaid Other | Admitting: Orthopedic Surgery

## 2023-02-11 DIAGNOSIS — L97511 Non-pressure chronic ulcer of other part of right foot limited to breakdown of skin: Secondary | ICD-10-CM | POA: Diagnosis not present

## 2023-02-11 DIAGNOSIS — I890 Lymphedema, not elsewhere classified: Secondary | ICD-10-CM

## 2023-02-11 DIAGNOSIS — L98499 Non-pressure chronic ulcer of skin of other sites with unspecified severity: Secondary | ICD-10-CM | POA: Diagnosis not present

## 2023-02-11 DIAGNOSIS — I872 Venous insufficiency (chronic) (peripheral): Secondary | ICD-10-CM

## 2023-02-12 ENCOUNTER — Ambulatory Visit: Payer: Medicaid Other | Admitting: Family

## 2023-02-16 ENCOUNTER — Encounter: Payer: Self-pay | Admitting: Orthopedic Surgery

## 2023-02-16 NOTE — Progress Notes (Signed)
Office Visit Note   Patient: Jesse [name]           Date of Birth: [birth year]           MRN: [id number] Visit Date: 02/11/2023              Requested by: Grayce Sessions, NP [address] [place],  [place] [address] PCP: Grayce Sessions, NP  Chief Complaint  Patient presents with   Right Foot - Wound Check   Left Foot - Wound Check      HPI: Patient is a 62 year old gentleman with venous and lymphatic insufficiency.  Patient has lymphedema pump demo scheduled for this week.  Patient noticed new ulceration on both feet.  Assessment & Plan: Visit Diagnoses:  1. Lymphedema   2. Ulcer of extremity due to chronic venous insufficiency   3. Non-pressure chronic ulcer of other part of right foot limited to breakdown of skin     Plan: Will wrap both legs with a Dynaflex wrap and postoperative shoes x 2.  Follow-Up Instructions: Return in about 1 week (around 02/18/2023).   Ortho Exam  Patient is alert, oriented, no adenopathy, well-dressed, normal affect, normal respiratory effort. Examination patient has a right foot Wagner grade 1 ulcer.  After informed consent a 10 blade knife was used to debride the skin and soft tissue back to healthy viable granulation tissue.  The ulcer measures 6 x 3 x 1 cm after debridement.  Patient has new venous and lymphatic blisters on both lower extremities there is a new ulcer on the right ankle.  Imaging: No results found. No images are attached to the encounter.  Labs: Lab Results  Component Value Date   HGBA1C 6.9 (H) 11/21/2022   HGBA1C 6.3 10/10/2022   HGBA1C 8.3 (H) 06/20/2022   ESRSEDRATE 137 (H) 07/08/2022   ESRSEDRATE 107 (H) 06/27/2022   ESRSEDRATE 108 (H) 04/08/2021   CRP 14.1 (H) 07/08/2022   CRP 16.9 (H) 06/27/2022   CRP 27.2 (H) 04/08/2021   LABURIC 5.3 07/04/2022   LABURIC 7.0 01/14/2016   REPTSTATUS 06/24/2022 FINAL 06/22/2022   GRAMSTAIN NO WBC SEEN NO ORGANISMS SEEN  04/18/2021   CULT (A) 06/22/2022     <10,000 COLONIES/mL INSIGNIFICANT GROWTH Performed at [facility], [address] [place], [place] [address]    LABORGA PROTEUS MIRABILIS 04/18/2021     Lab Results  Component Value Date   ALBUMIN 3.1 (L) 11/22/2022   ALBUMIN 3.5 11/21/2022   ALBUMIN 2.1 (L) 07/10/2022    Lab Results  Component Value Date   MG 1.3 (L) 07/10/2022   MG 1.6 (L) 07/08/2022   MG 1.3 (L) 07/07/2022   No results found for: "VD25OH"  No results found for: "PREALBUMIN"    Latest Ref Rng & Units 12/02/2022   12:25 PM 11/23/2022    7:00 AM 11/22/2022    6:30 AM  CBC EXTENDED  WBC 4.0 - 10.5 K/uL 5.4  8.1  8.3   RBC 4.22 - 5.81 MIL/uL 3.75  3.82  3.61   Hemoglobin 13.0 - 17.0 g/dL 30.0  92.3  30.0   HCT 39.0 - 52.0 % 36.7  36.5  34.9   Platelets 150 - 400 K/uL 279  238  222   NEUT# 1.7 - 7.7 K/uL 3.5     Lymph# 0.7 - 4.0 K/uL 1.2        There is no height or weight on file to calculate BMI.  Orders:  No  orders of the defined types were placed in this encounter.  No orders of the defined types were placed in this encounter.    Procedures: No procedures performed  Clinical Data: No additional findings.  ROS:  All other systems negative, except as noted in the HPI. Review of Systems  Objective: Vital Signs: There were no vitals taken for this visit.  Specialty Comments:  No specialty comments available.  PMFS History: Patient Active Problem List   Diagnosis Date Noted   Eye injury 11/21/2022   Hyperkalemia 07/10/2022   Chronic painful diabetic neuropathy 07/10/2022   Metabolic acidosis, normal anion gap (NAG) 07/10/2022   Hypomagnesemia 07/10/2022   Charcot's arthropathy 06/28/2022   Leukocytosis    Chronic 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    Severe protein-calorie malnutrition    Diabetic foot infection 06/22/2022   Sepsis 06/22/2022   Cellulitis in diabetic foot 04/17/2021   Osteomyelitis 04/14/2021   HTN  (hypertension) 04/08/2021   HLD (hyperlipidemia) 04/08/2021   Alcohol abuse    DM (diabetes mellitus) 01/27/2014   AKI (acute kidney injury) 01/27/2014   Past Medical History:  Diagnosis Date   AKI (acute kidney injury) 01/27/2014   Alcohol abuse    Anasarca    Bilateral leg edema 02/07/2021   Cellulitis    Cellulitis and abscess 01/26/2014   Cellulitis of right foot 04/17/2021   Dental abscess 01/26/2014   Diabetes mellitus type 2 in obese    Diabetic foot infection 04/08/2021   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 2 diabetes mellitus, limited to breakdown of skin    Facial cellulitis 01/26/2014   HTN (hypertension)    Hypokalemia 01/28/2014   Infected dental carries 01/26/2014   Leukocytosis 01/27/2014   Osteomyelitis 04/14/2021   Right foot infection     History reviewed. No pertinent family history.  Past Surgical History:  Procedure Laterality Date   I & D EXTREMITY Right 04/18/2021   Procedure: IRRIGATION AND DEBRIDEMENT OF FOOT;  Surgeon: Nadara Mustard, MD;  Location: [facility] OR;  Service: Orthopedics;  Laterality: Right;   I & D EXTREMITY Right 04/20/2021   Procedure: EXCISIONAL DEBRIDEMENT RIGHT FOOT, APPLICATION OF SKIN GRAFT;  Surgeon: Nadara Mustard, MD;  Location[facility];  Service: Orthopedics;  Laterality: Right;   Social History   Occupational History   Occupation: Curator  Tobacco Use   Smoking status: Never   Smokeless tobacco: Never  Vaping Use   Vaping Use: Never used  Substance and Sexual Activity   Alcohol use: Not Currently    Alcohol/week: 2.0 standard drinks of alcohol    Types: 2 Cans of beer per week    Comment: Occasional   Drug use: No   Sexual activity: Not Currently    Partners: Female

## 2023-02-26 ENCOUNTER — Other Ambulatory Visit: Payer: Medicaid Other

## 2023-02-26 NOTE — Patient Outreach (Signed)
  Medicaid Managed Care   Unsuccessful Outreach Note  02/26/2023 Name: Jesse [name] MRN: [id number] DOB: 02/25/1961  Referred by: Edwards, Michelle P, NP Reason for referral : High Risk Managed Medicaid (MM Social work telephone outreach )   An unsuccessful telephone outreach was attempted today. The patient was referred to the case management team for assistance with care management and care coordination.   Follow Up Plan: A HIPAA compliant phone message was left for the patient providing contact information and requesting a return call.   Geoffrey Mankin, BSW, MHA [place]  Managed Medicaid Social Worker [phone]  

## 2023-02-26 NOTE — Patient Instructions (Signed)
  Medicaid Managed Care   Unsuccessful Outreach Note  02/26/2023 Name: Jesse [name] MRN: [id number] DOB: [birth year]  Referred by: Grayce Sessions, NP Reason for referral : High Risk Managed Medicaid (MM Social work telephone outreach )   An unsuccessful telephone outreach was attempted today. The patient was referred to the case management team for assistance with care management and care coordination.   Follow Up Plan: A HIPAA compliant phone message was left for the patient providing contact information and requesting a return call.   Abelino Derrick, MHA [facility]  Managed [facility] Social Worker [phone]

## 2023-02-27 ENCOUNTER — Ambulatory Visit (INDEPENDENT_AMBULATORY_CARE_PROVIDER_SITE_OTHER): Payer: Medicaid Other | Admitting: Orthopedic Surgery

## 2023-02-27 ENCOUNTER — Encounter: Payer: Self-pay | Admitting: Orthopedic Surgery

## 2023-02-27 DIAGNOSIS — I890 Lymphedema, not elsewhere classified: Secondary | ICD-10-CM

## 2023-02-27 DIAGNOSIS — I872 Venous insufficiency (chronic) (peripheral): Secondary | ICD-10-CM

## 2023-02-27 DIAGNOSIS — L98499 Non-pressure chronic ulcer of skin of other sites with unspecified severity: Secondary | ICD-10-CM

## 2023-02-27 DIAGNOSIS — L03119 Cellulitis of unspecified part of limb: Secondary | ICD-10-CM | POA: Diagnosis not present

## 2023-02-27 DIAGNOSIS — R600 Localized edema: Secondary | ICD-10-CM

## 2023-02-27 NOTE — Progress Notes (Signed)
Patient drove his scooter to the office complaining of increased pain swelling and drainage from both lower extremities.  Examination patient has weeping edema and swelling in his foot and legs.  There is foul-smelling odor.  Ulcers on the plantar aspect of both feet.  Patient states that the rugs were removed from his apartment and there is role would that he is propelling his bare feet with his wheelchair.  Recommended patient be taken by [facility] directly to the emergency room.  He will need admission with IV antibiotics.  We do have the issue with his scooter.

## 2023-03-03 ENCOUNTER — Other Ambulatory Visit: Payer: Self-pay

## 2023-03-03 ENCOUNTER — Telehealth: Payer: Self-pay

## 2023-03-03 ENCOUNTER — Inpatient Hospital Stay (HOSPITAL_COMMUNITY): Payer: Medicaid Other

## 2023-03-03 ENCOUNTER — Emergency Department (HOSPITAL_COMMUNITY): Payer: Medicaid Other

## 2023-03-03 ENCOUNTER — Inpatient Hospital Stay (HOSPITAL_COMMUNITY)
Admission: EM | Admit: 2023-03-03 | Discharge: 2023-03-10 | DRG: 853 | Disposition: A | Discharge status: Home / Self Care | Payer: Medicaid Other | Attending: Family Medicine | Admitting: Family Medicine

## 2023-03-03 ENCOUNTER — Encounter (HOSPITAL_COMMUNITY): Payer: Self-pay

## 2023-03-03 DIAGNOSIS — E1152 Type 2 diabetes mellitus with diabetic peripheral angiopathy with gangrene: Secondary | ICD-10-CM | POA: Diagnosis present

## 2023-03-03 DIAGNOSIS — E11621 Type 2 diabetes mellitus with foot ulcer: Secondary | ICD-10-CM | POA: Diagnosis present

## 2023-03-03 DIAGNOSIS — I890 Lymphedema, not elsewhere classified: Secondary | ICD-10-CM | POA: Diagnosis present

## 2023-03-03 DIAGNOSIS — A419 Sepsis, unspecified organism: Principal | ICD-10-CM | POA: Diagnosis present

## 2023-03-03 DIAGNOSIS — E872 Acidosis, unspecified: Secondary | ICD-10-CM | POA: Diagnosis present

## 2023-03-03 DIAGNOSIS — Z794 Long term (current) use of insulin: Secondary | ICD-10-CM

## 2023-03-03 DIAGNOSIS — E43 Unspecified severe protein-calorie malnutrition: Secondary | ICD-10-CM | POA: Diagnosis present

## 2023-03-03 DIAGNOSIS — E119 Type 2 diabetes mellitus without complications: Secondary | ICD-10-CM

## 2023-03-03 DIAGNOSIS — E785 Hyperlipidemia, unspecified: Secondary | ICD-10-CM | POA: Diagnosis present

## 2023-03-03 DIAGNOSIS — E861 Hypovolemia: Secondary | ICD-10-CM | POA: Diagnosis present

## 2023-03-03 DIAGNOSIS — I10 Essential (primary) hypertension: Secondary | ICD-10-CM | POA: Diagnosis present

## 2023-03-03 DIAGNOSIS — E1165 Type 2 diabetes mellitus with hyperglycemia: Secondary | ICD-10-CM | POA: Diagnosis present

## 2023-03-03 DIAGNOSIS — K7460 Unspecified cirrhosis of liver: Secondary | ICD-10-CM | POA: Diagnosis present

## 2023-03-03 DIAGNOSIS — M869 Osteomyelitis, unspecified: Secondary | ICD-10-CM | POA: Diagnosis present

## 2023-03-03 DIAGNOSIS — B870 Cutaneous myiasis: Secondary | ICD-10-CM | POA: Diagnosis present

## 2023-03-03 DIAGNOSIS — E1140 Type 2 diabetes mellitus with diabetic neuropathy, unspecified: Secondary | ICD-10-CM | POA: Diagnosis present

## 2023-03-03 DIAGNOSIS — E11610 Type 2 diabetes mellitus with diabetic neuropathic arthropathy: Secondary | ICD-10-CM | POA: Diagnosis present

## 2023-03-03 DIAGNOSIS — E871 Hypo-osmolality and hyponatremia: Secondary | ICD-10-CM | POA: Diagnosis present

## 2023-03-03 DIAGNOSIS — Z5320 Procedure and treatment not carried out because of patient's decision for unspecified reasons: Secondary | ICD-10-CM | POA: Diagnosis not present

## 2023-03-03 DIAGNOSIS — L97519 Non-pressure chronic ulcer of other part of right foot with unspecified severity: Secondary | ICD-10-CM | POA: Diagnosis present

## 2023-03-03 DIAGNOSIS — Z6832 Body mass index (BMI) 32.0-32.9, adult: Secondary | ICD-10-CM

## 2023-03-03 DIAGNOSIS — L97429 Non-pressure chronic ulcer of left heel and midfoot with unspecified severity: Secondary | ICD-10-CM | POA: Diagnosis present

## 2023-03-03 DIAGNOSIS — R6520 Severe sepsis without septic shock: Secondary | ICD-10-CM | POA: Diagnosis present

## 2023-03-03 DIAGNOSIS — L97529 Non-pressure chronic ulcer of other part of left foot with unspecified severity: Secondary | ICD-10-CM | POA: Diagnosis present

## 2023-03-03 DIAGNOSIS — M86272 Subacute osteomyelitis, left ankle and foot: Secondary | ICD-10-CM | POA: Diagnosis not present

## 2023-03-03 DIAGNOSIS — D689 Coagulation defect, unspecified: Secondary | ICD-10-CM | POA: Diagnosis present

## 2023-03-03 DIAGNOSIS — K7290 Hepatic failure, unspecified without coma: Secondary | ICD-10-CM | POA: Diagnosis present

## 2023-03-03 DIAGNOSIS — F1020 Alcohol dependence, uncomplicated: Secondary | ICD-10-CM | POA: Diagnosis present

## 2023-03-03 DIAGNOSIS — N179 Acute kidney failure, unspecified: Secondary | ICD-10-CM | POA: Diagnosis present

## 2023-03-03 DIAGNOSIS — M86171 Other acute osteomyelitis, right ankle and foot: Secondary | ICD-10-CM | POA: Diagnosis not present

## 2023-03-03 DIAGNOSIS — E1169 Type 2 diabetes mellitus with other specified complication: Secondary | ICD-10-CM | POA: Diagnosis present

## 2023-03-03 DIAGNOSIS — L089 Local infection of the skin and subcutaneous tissue, unspecified: Principal | ICD-10-CM

## 2023-03-03 DIAGNOSIS — M1460 Charcot's joint, unspecified site: Secondary | ICD-10-CM | POA: Diagnosis present

## 2023-03-03 DIAGNOSIS — I96 Gangrene, not elsewhere classified: Secondary | ICD-10-CM | POA: Diagnosis present

## 2023-03-03 DIAGNOSIS — E875 Hyperkalemia: Secondary | ICD-10-CM | POA: Diagnosis present

## 2023-03-03 DIAGNOSIS — Z5941 Food insecurity: Secondary | ICD-10-CM

## 2023-03-03 DIAGNOSIS — I878 Other specified disorders of veins: Secondary | ICD-10-CM | POA: Diagnosis present

## 2023-03-03 DIAGNOSIS — Z79899 Other long term (current) drug therapy: Secondary | ICD-10-CM

## 2023-03-03 LAB — CBC WITH DIFFERENTIAL/PLATELET
Abs Immature Granulocytes: 0.43 10*3/uL — ABNORMAL HIGH (ref 0.00–0.07)
Basophils Absolute: 0.1 10*3/uL (ref 0.0–0.1)
Basophils Relative: 0 %
Eosinophils Absolute: 0.1 10*3/uL (ref 0.0–0.5)
Eosinophils Relative: 0 %
HCT: 35.9 % — ABNORMAL LOW (ref 39.0–52.0)
Hemoglobin: 11.8 g/dL — ABNORMAL LOW (ref 13.0–17.0)
Immature Granulocytes: 2 %
Lymphocytes Relative: 6 %
Lymphs Abs: 1.2 10*3/uL (ref 0.7–4.0)
MCH: 32.7 pg (ref 26.0–34.0)
MCHC: 32.9 g/dL (ref 30.0–36.0)
MCV: 99.4 fL (ref 80.0–100.0)
Monocytes Absolute: 2.5 10*3/uL — ABNORMAL HIGH (ref 0.1–1.0)
Monocytes Relative: 11 %
Neutro Abs: 18 10*3/uL — ABNORMAL HIGH (ref 1.7–7.7)
Neutrophils Relative %: 81 %
Platelets: 336 10*3/uL (ref 150–400)
RBC: 3.61 MIL/uL — ABNORMAL LOW (ref 4.22–5.81)
RDW: 14.7 % (ref 11.5–15.5)
WBC: 22.4 10*3/uL — ABNORMAL HIGH (ref 4.0–10.5)
nRBC: 0 % (ref 0.0–0.2)

## 2023-03-03 LAB — CBG MONITORING, ED: Glucose-Capillary: 163 mg/dL — ABNORMAL HIGH (ref 70–99)

## 2023-03-03 LAB — BASIC METABOLIC PANEL
Anion gap: 15 (ref 5–15)
BUN: 94 mg/dL — ABNORMAL HIGH (ref 6–20)
CO2: 15 mmol/L — ABNORMAL LOW (ref 22–32)
Calcium: 9.2 mg/dL (ref 8.9–10.3)
Chloride: 99 mmol/L (ref 98–111)
Creatinine, Ser: 2.06 mg/dL — ABNORMAL HIGH (ref 0.61–1.24)
GFR, Estimated: 36 mL/min — ABNORMAL LOW (ref >60)
Glucose, Bld: 134 mg/dL — ABNORMAL HIGH (ref 70–99)
Potassium: 5.4 mmol/L — ABNORMAL HIGH (ref 3.5–5.1)
Sodium: 129 mmol/L — ABNORMAL LOW (ref 135–145)

## 2023-03-03 LAB — MRSA NEXT GEN BY PCR, NASAL: MRSA by PCR Next Gen: NOT DETECTED

## 2023-03-03 LAB — HEPATIC FUNCTION PANEL
ALT: 38 U/L (ref 0–44)
AST: 65 U/L — ABNORMAL HIGH (ref 15–41)
Albumin: 2.1 g/dL — ABNORMAL LOW (ref 3.5–5.0)
Alkaline Phosphatase: 172 U/L — ABNORMAL HIGH (ref 38–126)
Bilirubin, Direct: 0.4 mg/dL — ABNORMAL HIGH (ref 0.0–0.2)
Indirect Bilirubin: 0.7 mg/dL (ref 0.3–0.9)
Total Bilirubin: 1.1 mg/dL (ref 0.3–1.2)
Total Protein: 9.1 g/dL — ABNORMAL HIGH (ref 6.5–8.1)

## 2023-03-03 LAB — URINALYSIS, ROUTINE W REFLEX MICROSCOPIC
Bilirubin Urine: NEGATIVE
Glucose, UA: NEGATIVE mg/dL
Ketones, ur: NEGATIVE mg/dL
Leukocytes,Ua: NEGATIVE
Nitrite: NEGATIVE
Protein, ur: NEGATIVE mg/dL
Specific Gravity, Urine: 1.009 (ref 1.005–1.030)
pH: 5 (ref 5.0–8.0)

## 2023-03-03 LAB — CK: Total CK: 140 U/L (ref 49–397)

## 2023-03-03 LAB — PROTIME-INR
INR: 1.8 — ABNORMAL HIGH (ref 0.8–1.2)
Prothrombin Time: 20.5 seconds — ABNORMAL HIGH (ref 11.4–15.2)

## 2023-03-03 LAB — LACTIC ACID, PLASMA: Lactic Acid, Venous: 1.2 mmol/L (ref 0.5–1.9)

## 2023-03-03 LAB — SEDIMENTATION RATE: Sed Rate: 132 mm/hr — ABNORMAL HIGH (ref 0–16)

## 2023-03-03 MED ORDER — LEVETIRACETAM 500 MG PO TABS
500.0000 mg | ORAL_TABLET | Freq: Two times a day (BID) | ORAL | Status: DC
  Administered 2023-03-03 – 2023-03-10 (×15): 500 mg via ORAL
  Filled 2023-03-03 (×15): qty 1

## 2023-03-03 MED ORDER — SODIUM ZIRCONIUM CYCLOSILICATE 10 G PO PACK
10.0000 g | PACK | Freq: Once | ORAL | Status: AC
  Administered 2023-03-03: 10 g via ORAL
  Filled 2023-03-03: qty 1

## 2023-03-03 MED ORDER — HEPARIN SODIUM (PORCINE) 5000 UNIT/ML IJ SOLN
5000.0000 [IU] | Freq: Two times a day (BID) | INTRAMUSCULAR | Status: DC
  Administered 2023-03-03: 5000 [IU] via SUBCUTANEOUS
  Filled 2023-03-03: qty 1

## 2023-03-03 MED ORDER — HYDROMORPHONE HCL 1 MG/ML IJ SOLN: 0.5000 mg | INTRAMUSCULAR | Status: DC | PRN

## 2023-03-03 MED ORDER — OXYCODONE HCL 5 MG PO TABS
5.0000 mg | ORAL_TABLET | ORAL | Status: DC | PRN
  Administered 2023-03-04: 5 mg via ORAL
  Filled 2023-03-03: qty 1

## 2023-03-03 MED ORDER — GADOBUTROL 1 MMOL/ML IV SOLN
10.0000 mL | Freq: Once | INTRAVENOUS | Status: AC | PRN
  Administered 2023-03-03: 10 mL via INTRAVENOUS

## 2023-03-03 MED ORDER — ONDANSETRON HCL 4 MG PO TABS: 4.0000 mg | ORAL_TABLET | Freq: Four times a day (QID) | ORAL | Status: DC | PRN

## 2023-03-03 MED ORDER — BISACODYL 5 MG PO TBEC: 5.0000 mg | DELAYED_RELEASE_TABLET | Freq: Every day | ORAL | Status: DC | PRN

## 2023-03-03 MED ORDER — LACTATED RINGERS IV BOLUS
1000.0000 mL | Freq: Once | INTRAVENOUS | Status: AC
  Administered 2023-03-03: 1000 mL via INTRAVENOUS

## 2023-03-03 MED ORDER — ACETAMINOPHEN 325 MG PO TABS: 650.0000 mg | ORAL_TABLET | Freq: Four times a day (QID) | ORAL | Status: DC | PRN

## 2023-03-03 MED ORDER — HYDRALAZINE HCL 20 MG/ML IJ SOLN: 5.0000 mg | Freq: Four times a day (QID) | INTRAMUSCULAR | Status: DC | PRN

## 2023-03-03 MED ORDER — PIPERACILLIN-TAZOBACTAM 3.375 G IVPB
3.3750 g | Freq: Three times a day (TID) | INTRAVENOUS | Status: DC
  Administered 2023-03-03 – 2023-03-10 (×19): 3.375 g via INTRAVENOUS
  Filled 2023-03-03 (×21): qty 50

## 2023-03-03 MED ORDER — ACETAMINOPHEN 500 MG PO TABS
1000.0000 mg | ORAL_TABLET | Freq: Three times a day (TID) | ORAL | Status: DC
  Administered 2023-03-03 – 2023-03-10 (×18): 1000 mg via ORAL
  Filled 2023-03-03 (×20): qty 2

## 2023-03-03 MED ORDER — PIPERACILLIN-TAZOBACTAM 3.375 G IVPB 30 MIN
3.3750 g | Freq: Once | INTRAVENOUS | Status: AC
  Administered 2023-03-03: 3.375 g via INTRAVENOUS
  Filled 2023-03-03: qty 50

## 2023-03-03 MED ORDER — INSULIN ASPART 100 UNIT/ML IJ SOLN
0.0000 [IU] | Freq: Three times a day (TID) | INTRAMUSCULAR | Status: DC
  Administered 2023-03-03: 3 [IU] via SUBCUTANEOUS
  Administered 2023-03-04 – 2023-03-06 (×2): 2 [IU] via SUBCUTANEOUS
  Administered 2023-03-07 – 2023-03-09 (×4): 3 [IU] via SUBCUTANEOUS

## 2023-03-03 MED ORDER — PHYTONADIONE 5 MG PO TABS
5.0000 mg | ORAL_TABLET | Freq: Once | ORAL | Status: AC
  Administered 2023-03-03: 5 mg via ORAL
  Filled 2023-03-03: qty 1

## 2023-03-03 MED ORDER — ONDANSETRON HCL 4 MG/2ML IJ SOLN: 4.0000 mg | Freq: Four times a day (QID) | INTRAMUSCULAR | Status: DC | PRN

## 2023-03-03 MED ORDER — GABAPENTIN 100 MG PO CAPS
100.0000 mg | ORAL_CAPSULE | Freq: Three times a day (TID) | ORAL | Status: DC
  Administered 2023-03-03 – 2023-03-10 (×20): 100 mg via ORAL
  Filled 2023-03-03 (×20): qty 1

## 2023-03-03 MED ORDER — DORZOLAMIDE HCL-TIMOLOL MAL 2-0.5 % OP SOLN
1.0000 [drp] | Freq: Two times a day (BID) | OPHTHALMIC | Status: DC
  Administered 2023-03-03 – 2023-03-10 (×10): 1 [drp] via OPHTHALMIC
  Filled 2023-03-03 (×3): qty 10

## 2023-03-03 MED ORDER — ENOXAPARIN SODIUM 40 MG/0.4ML IJ SOSY: 40.0000 mg | PREFILLED_SYRINGE | INTRAMUSCULAR | Status: DC

## 2023-03-03 MED ORDER — ACETAMINOPHEN 650 MG RE SUPP: 650.0000 mg | Freq: Four times a day (QID) | RECTAL | Status: DC | PRN

## 2023-03-03 MED ORDER — SENNOSIDES-DOCUSATE SODIUM 8.6-50 MG PO TABS: 1.0000 | ORAL_TABLET | Freq: Every evening | ORAL | Status: DC | PRN

## 2023-03-03 MED ORDER — SODIUM CHLORIDE 0.9 % IV SOLN: INTRAVENOUS | Status: DC
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
## 2023-03-03 NOTE — Progress Notes (Signed)
Pharmacy Antibiotic Note  Jesse [name] is a 62 y.o. male admitted on 03/03/2023 presenting with worsening foot infection.  Pharmacy has been consulted for zosyn dosing.  Plan: Zosyn 3.375g IV every 8 hours (extended 4h infusion) Monitor renal function, Ortho recs and any intervention plans   Height: 6' (182.9 cm) Weight: 108.9 kg (240 lb) IBW/kg (Calculated) : 77.6  Temp (24hrs), Avg:97.6 F (36.4 C), Min:97.6 F (36.4 C), Max:97.6 F (36.4 C)  Recent Labs  Lab 03/03/23 0922 03/03/23 1038  WBC 22.4*  --   CREATININE 2.06*  --   LATICACIDVEN  --  1.2    Estimated Creatinine Clearance: 48.6 mL/min (A) (by C-G formula based on SCr of 2.06 mg/dL (H)).    No Known Allergies  Daylene Posey, PharmD, [facility] Clinical Pharmacist ED Pharmacist Phone # [phone] 03/03/2023 1:42 PM

## 2023-03-03 NOTE — ED Notes (Signed)
Pt has wound on posterior of foot 5cmx5cm. 1.7cm deep. The wound has purulent drainage coming from it. Pt has wound on left heel of foot 10cm wide w/serosangeous drainage. Both wounds are foul smelling. Pt has 4+ edema of LE bilat. Pt has 2+ pedal pulse bilat, warm to touch. Pt states has numbness in both feet.

## 2023-03-03 NOTE — Progress Notes (Addendum)
INR=1.8, clinically suspect baseline cirrhosis. Waiting for LFTs. One dose of VitK given and repeat INR in AM.  D/C chemical DVT prophylaxis.

## 2023-03-03 NOTE — ED Provider Notes (Signed)
[place] [facility]PARTMENT AT [facility] Provider Note   CSN: [id number] Arrival date & time: 03/03/23  0901     History Chief Complaint  Patient presents with   sepsis   Wound Infection    HPI Jesse [name] is a 62 y.o. male presenting for bilateral lower extremity ulcerations.  He is well-known to the orthopedics team's. Was told to come to the hospital for IV antibiotics on Friday given worsening ulceration however patient was otherwise disposed this weekend. He denies fevers chills nausea vomiting shortness of breath.  States that the flooring got taken out of his apartment and so he has been rolling his wheelchair on the subfloor using his feet.  Cannot wear shoes because of degree of lymphedema  Patient's recorded medical, surgical, social, medication list and allergies were reviewed in the Snapshot window as part of the initial history.   Review of Systems   Review of Systems  Constitutional:  Negative for chills and fever.  HENT:  Negative for ear pain and sore throat.   Eyes:  Negative for pain and visual disturbance.  Respiratory:  Negative for cough and shortness of breath.   Cardiovascular:  Negative for chest pain and palpitations.  Gastrointestinal:  Negative for abdominal pain and vomiting.  Genitourinary:  Negative for dysuria and hematuria.  Musculoskeletal:  Positive for gait problem. Negative for arthralgias and back pain.  Skin:  Positive for wound. Negative for color change and rash.  Neurological:  Negative for seizures and syncope.  All other systems reviewed and are negative.   Physical Exam Updated Vital Signs BP 128/70   Pulse 86   Temp 97.6 F (36.4 C) (Oral)   Resp 20   Ht 6' (1.829 m)   Wt 108.9 kg   SpO2 98%   BMI 32.55 kg/m  Physical Exam Vitals and nursing note reviewed.  Constitutional:      General: He is not in acute distress.    Appearance: He is well-developed.  HENT:     Head: Normocephalic and atraumatic.   Eyes:     Conjunctiva/sclera: Conjunctivae normal.  Cardiovascular:     Rate and Rhythm: Normal rate and regular rhythm.     Heart sounds: No murmur heard. Pulmonary:     Effort: Pulmonary effort is normal. No respiratory distress.     Breath sounds: Normal breath sounds.  Abdominal:     Palpations: Abdomen is soft.     Tenderness: There is no abdominal tenderness.  Musculoskeletal:        General: Deformity and signs of injury present. No swelling.     Cervical back: Neck supple.     Right lower leg: Edema present.     Left lower leg: Edema present.  Skin:    General: Skin is warm and dry.     Capillary Refill: Capillary refill takes less than 2 seconds.  Neurological:     Mental Status: He is alert.  Psychiatric:        Mood and Affect: Mood normal.         ED Course/ Medical Decision Making/ A&P    Procedures Procedures   Medications Ordered in ED Medications  acetaminophen (TYLENOL) tablet 650 mg (has no administration in time range)    Or  acetaminophen (TYLENOL) suppository 650 mg (has no administration in time range)  oxyCODONE (Oxy IR/ROXICODONE) immediate release tablet 5 mg (has no administration in time range)  HYDROmorphone (DILAUDID) injection 0.5-1 mg (has no administration in time  range)  ondansetron (ZOFRAN) tablet 4 mg (has no administration in time range)    Or  ondansetron (ZOFRAN) injection 4 mg (has no administration in time range)  bisacodyl (DULCOLAX) EC tablet 5 mg (has no administration in time range)  senna-docusate (Senokot-S) tablet 1 tablet (has no administration in time range)  heparin injection 5,000 Units (5,000 Units Subcutaneous Given 03/03/23 1333)  acetaminophen (TYLENOL) tablet 1,000 mg (1,000 mg Oral Given 03/03/23 1332)  gabapentin (NEURONTIN) capsule 100 mg (has no administration in time range)  levETIRAcetam (KEPPRA) tablet 500 mg (500 mg Oral Given 03/03/23 1331)  dorzolamide-timolol (COSOPT) 2-0.5 % ophthalmic solution 1  drop (has no administration in time range)  hydrALAZINE (APRESOLINE) injection 5 mg (has no administration in time range)  insulin aspart (novoLOG) injection 0-15 Units (has no administration in time range)  0.9 %  sodium chloride infusion ( Intravenous New Bag/Given 03/03/23 1340)  piperacillin-tazobactam (ZOSYN) IVPB 3.375 g (has no administration in time range)  piperacillin-tazobactam (ZOSYN) IVPB 3.375 g (0 g Intravenous Stopped 03/03/23 1020)  lactated ringers bolus 1,000 mL (1,000 mLs Intravenous Bolus 03/03/23 1116)  sodium zirconium cyclosilicate (LOKELMA) packet 10 g (10 g Oral Given 03/03/23 1330)    Medical Decision Making:    Jesse [name] is a 62 y.o. male who presented to the ED today with profound bilateral lower extremity swelling detailed above.     Complete initial physical exam performed, notably the patient  was hemodynamically stable in no acute distress.  Obvious deformities of the bilateral lower extremities.      Reviewed and confirmed nursing documentation for past medical history, family history, social history.    Initial Assessment:   With the patient's presentation of lower extremity swelling and pain, most likely diagnosis is soft tissue infection from a chronic ulceration. Other diagnoses were considered including (but not limited to) ischemic limb, osteomyelitis, necrotizing fasciitis. These are considered less likely due to history of present illness and physical exam findings.   This is most consistent with an acute life/limb threatening illness complicated by underlying chronic conditions.  Initial Plan:  X-rays of the bilateral feet Screening labs including CBC and Metabolic panel to evaluate for infectious or metabolic etiology of disease.  Objective evaluation as below reviewed with plan for close reassessment  Initial Study Results:   Laboratory  All laboratory results reviewed without evidence of clinically relevant pathology.   Exceptions  include: Gross leukocytosis  Radiology  All images reviewed independently. Agree with radiology report at this time.   DG Foot 2 Views Right  Result Date: 03/03/2023 CLINICAL DATA:  Foot ulcers. EXAM: RIGHT FOOT - 2 VIEW COMPARISON:  Right foot radiographs 09/18/2022; MRI right forefoot 06/23/2022 FINDINGS: There is diffuse decreased bone mineralization. There is again a staple overlying the plantar midfoot soft tissues. In this region there is a more superficial soft tissue ulceration measuring approximately 7 mm in craniocaudal depth, similar to prior. There is worsened scattered subcutaneous air extending into the superficial hindfoot soft tissues just posterior to this ulcer. There is a mild-to-moderate plantar calcaneal heel spur. There may be slightly worsened low bone mineralization seen at the tip of the calcaneal heel spur, and it is difficult to exclude acute osteomyelitis. Mild interphalangeal joint space narrowing diffusely. Mild second tarsometatarsal joint space narrowing. Mild dorsal midfoot degenerative osteophytes on lateral view. No acute fracture. IMPRESSION: There is a staple overlying the plantar midfoot soft tissues with similar overlying anterior midfoot soft tissue ulcer. There is worsened scattered subcutaneous  air extending into the superficial/plantar hindfoot soft tissues just posterior to this ulcer. Possible new bone loss at the tip of a plantar calcaneal heel spur, and it is difficult to exclude early acute osteomyelitis in this region. Electronically Signed   By: Neita Garnet M.D.   On: 03/03/2023 10:33   DG Foot 2 Views Left  Addendum Date: 03/03/2023   ADDENDUM REPORT: 03/03/2023 10:29 IMPRESSION: As described in the findings, there is a new acute fracture of the calcaneus extending anteriorly and superiorly from the region of posterior plantar calcaneal bone erosion that is concerning for osteomyelitis. Electronically Signed   By: Neita Garnet M.D.   On: 03/03/2023  10:29   Result Date: 03/03/2023 CLINICAL DATA:  Foot ulcers. EXAM: LEFT FOOT - 2 VIEW COMPARISON:  Left foot radiographs 06/20/2022 FINDINGS: There is mild soft tissue loss of the distal medial aspect of the great toe which is mildly improved from prior and likely reflects interval at least partial healing of the prior ulcer. There is minimal worsening of the previously seen mild-to-moderate erosion of the distal medial aspect of the distal phalanx of the great toe, now with more chronic appearing partially corticated margin. Minimal great toe metatarsophalangeal joint space narrowing and peripheral osteophytosis. New high-grade ulceration of the plantar heel in a region measuring up to approximately 7 cm in AP dimension. Lucency from this ulceration is also seen at the medial ankle/hindfoot on frontal view. There is new lucency within the adjacent inferior aspect of the calcaneus in a region measuring approximately 1.4 cm in craniocaudal height. Two new linear longitudinal acute fracture line lucency is extending from this region superiorly and anteriorly to the posterior calcaneal facet at the posterior subtalar joint. Mild dorsal navicular-cuneiform degenerative osteophytes. IMPRESSION: 1. New high-grade ulceration of the plantar heel in a region measuring up to 7 cm in AP dimension. There is new lucency and cortical erosion within the adjacent inferior aspect of the calcaneus in a region measuring approximately 1.4 cm in craniocaudal height. This is suspicious for acute osteomyelitis. 2. There is minimal worsening of the previously seen mild-to-moderate erosion of the distal medial aspect of the great toe distal phalanx. Improvement in the overlying distal medial great toe soft tissue ulcer. This may represent the sequela of the prior active osteomyelitis. It is unclear whether there is any ongoing osteomyelitis in this region. Electronically Signed: By: Neita Garnet M.D. On: 03/03/2023 10:24     Consults:   Case discussed with orthopedic surgery.  They evaluated at bedside and recommended bilateral MRIs.  They recommended admission to medicine which was arranged..   Final Assessment and Plan:   Overall, patient with severe soft tissue disease.  MRIs are ordered, broad-spectrum antibiosis was initiated and patient was arranged for admission to medicine for further diagnostic care and management.   Disposition:   Based on the above findings, I believe this patient is stable for admission.    Patient/family educated about specific findings on our evaluation and explained exact reasons for admission.  Patient/family educated about clinical situation and time was allowed to answer questions.   Admission team communicated with and agreed with need for admission. Patient admitted. Patient ready to move at this time.     Emergency Department Medication Summary:   Medications  acetaminophen (TYLENOL) tablet 650 mg (has no administration in time range)    Or  acetaminophen (TYLENOL) suppository 650 mg (has no administration in time range)  oxyCODONE (Oxy IR/ROXICODONE) immediate release tablet 5 mg (  has no administration in time range)  HYDROmorphone (DILAUDID) injection 0.5-1 mg (has no administration in time range)  ondansetron (ZOFRAN) tablet 4 mg (has no administration in time range)    Or  ondansetron (ZOFRAN) injection 4 mg (has no administration in time range)  bisacodyl (DULCOLAX) EC tablet 5 mg (has no administration in time range)  senna-docusate (Senokot-S) tablet 1 tablet (has no administration in time range)  heparin injection 5,000 Units (5,000 Units Subcutaneous Given 03/03/23 1333)  acetaminophen (TYLENOL) tablet 1,000 mg (1,000 mg Oral Given 03/03/23 1332)  gabapentin (NEURONTIN) capsule 100 mg (has no administration in time range)  levETIRAcetam (KEPPRA) tablet 500 mg (500 mg Oral Given 03/03/23 1331)  dorzolamide-timolol (COSOPT) 2-0.5 % ophthalmic solution 1 drop (has no  administration in time range)  hydrALAZINE (APRESOLINE) injection 5 mg (has no administration in time range)  insulin aspart (novoLOG) injection 0-15 Units (has no administration in time range)  0.9 %  sodium chloride infusion ( Intravenous New Bag/Given 03/03/23 1340)  piperacillin-tazobactam (ZOSYN) IVPB 3.375 g (has no administration in time range)  piperacillin-tazobactam (ZOSYN) IVPB 3.375 g (0 g Intravenous Stopped 03/03/23 1020)  lactated ringers bolus 1,000 mL (1,000 mLs Intravenous Bolus 03/03/23 1116)  sodium zirconium cyclosilicate (LOKELMA) packet 10 g (10 g Oral Given 03/03/23 1330)         Clinical Impression:  1. Wound infection      Admit   Final Clinical Impression(s) / ED Diagnoses Final diagnoses:  Wound infection    Rx / DC Orders ED Discharge Orders     None         Glyn Ade, MD 03/03/23 [phone]
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
## 2023-03-03 NOTE — Telephone Encounter (Signed)
Called and lm on vm to advise that I was calling to check in with him we had advised him to go to the ER at his last visit. He had walked into the office with c/o bilateral foot pain and ulceration. We wanted the pt to go out EMS but pt refused. He advised that he was going to go to the ER directly from our office and the pt never arrived. Check record this morning and pt has not been over the weekend and advised that I was trying to follow up on this and asked fro him to return call. Will hold this message and await return phone call.

## 2023-03-03 NOTE — Telephone Encounter (Signed)
Pt went to ED this morning. Dr. Lajoyce Corners was notified by [facility] ED.

## 2023-03-03 NOTE — H&P (Signed)
History and Physical    Jesse [name] UJW:[id number] DOB: 1961-11-08 DOA: 03/03/2023  PCP: Grayce Sessions, NP (Confirm with patient/family/NH records and if not entered, this has to be entered at [facility] point of entry) Patient coming from: Home  I have personally briefly reviewed patient's old medical records in [facility]  Chief Complaint: Worsening of bilateral foot infection  HPI: Jesse [name] is a 62 y.o. male with medical history significant of HTN, HLD, IDDM, diabetic neuropathy, chronic bilateral feet diabetic ulcers, chronic venous stasis and lymphedema, presented with worsening of bilateral feet infection.  Patient has a chronic venous stasis and lymphedema bilateral lower extremities, and chronic ulcers of bilateral lower extremity including back of heels, bottoms of feet for which he has been following with orthopedic surgery and wound care center every 2 to 3 weeks.  Patient also underwent several rounds of p.o. antibiotics and I&D several times recently with no significant improvement.  4 days ago he started to have low-grade fever episode chills and worsening of bilateral feet pain and went to see orthopedic surgeon who recommended that patient come to ED for management of worsening of bilateral feet infection.  ED Course: Febrile, nonhypotensive normal tachycardia.  X-ray showed stable overlying the plantar midfoot soft tissue with midfoot soft tissue ulcer worsening scattered subcutaneous extending into superficial/plantar foot soft tissue, possible new bone loss at the tip of the plantar calcaneal heel spur suspicious for osteomyelitis.  WBC 22.4, hemoglobin 11.8 platelet 336, sodium 129, potassium 5.4 bicarb 15, BUN 94, creatinine 2.0   Review of Systems: As per HPI otherwise 14 point review of systems negative.    Past Medical History:  Diagnosis Date   AKI (acute kidney injury) 01/27/2014   Alcohol abuse    Anasarca    Bilateral leg edema 02/07/2021    Cellulitis    Cellulitis and abscess 01/26/2014   Cellulitis of right foot 04/17/2021   Dental abscess 01/26/2014   Diabetes mellitus type 2 in obese    Diabetic foot infection 04/08/2021   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 2 diabetes mellitus, limited to breakdown of skin    Facial cellulitis 01/26/2014   HTN (hypertension)    Hypokalemia 01/28/2014   Infected dental carries 01/26/2014   Leukocytosis 01/27/2014   Osteomyelitis 04/14/2021   Right foot infection     Past Surgical History:  Procedure Laterality Date   I & D EXTREMITY Right 04/18/2021   Procedure: IRRIGATION AND DEBRIDEMENT OF FOOT;  Surgeon: Nadara Mustard, MD;  Location: [facility] OR;  Service: Orthopedics;  Laterality: Right;   I & D EXTREMITY Right 04/20/2021   Procedure: EXCISIONAL DEBRIDEMENT RIGHT FOOT, APPLICATION OF SKIN GRAFT;  Surgeon: Nadara Mustard, MD;  Location: [facility] OR;  Service: Orthopedics;  Laterality: Right;     reports that he has never smoked. He has never used smokeless tobacco. He reports that he does not currently use alcohol after a past usage of about 2.0 standard drinks of alcohol per week. He reports that he does not use drugs.  No Known Allergies  History reviewed. No pertinent family history.  Prior to Admission medications   Medication Sig Start Date End Date Taking? Authorizing Provider  acetaminophen (TYLENOL) 500 MG tablet Take 2 tablets (1,000 mg total) by mouth every 8 (eight) hours. 11/23/22  Yes Simaan, Francine Graven, PA-C  dorzolamide-timolol (COSOPT) 2-0.5 % ophthalmic solution Place 1 drop into the right eye 2 (two) times daily. 11/23/22  Yes Adam Phenix, PA-C  gabapentin (NEURONTIN) 100 MG capsule Take 1 capsule (100 mg total) by mouth 3 (three) times daily. 11/20/22 03/03/23 Yes Edwards, Kinnie Scales, NP  insulin glargine (LANTUS) 100 UNIT/ML Solostar Pen INJECT 10 UNITS INTO THE SKIN DAILY. 11/20/22  Yes Grayce Sessions, NP  Insulin Pen Needle 32G X 4 MM MISC Use as  directed with insulin pen 07/03/22  Yes Rai, Ripudeep K, MD  levETIRAcetam (KEPPRA) 500 MG tablet Take 1 tablet (500 mg total) by mouth 2 (two) times daily for 5 days. 11/23/22 03/03/23 Yes Simaan, Francine Graven, PA-C  valsartan-hydrochlorothiazide (DIOVAN-HCT) 160-25 MG tablet Take 1 tablet by mouth daily. 11/20/22  Yes Grayce Sessions, NP  erythromycin ophthalmic ointment Place a 1/2 inch ribbon of ointment into the lower eyelid three times daily. Patient not taking: Reported on 03/03/2023 12/02/22   Maury Dus, MD  methocarbamol (ROBAXIN) 750 MG tablet Take 1 tablet (750 mg total) by mouth every 6 (six) hours as needed for muscle spasms. Patient not taking: Reported on 03/03/2023 11/23/22   Adam Phenix, PA-C  Nystatin (GERHARDT'S BUTT CREAM) CREA Apply 1 Application topically 3 (three) times daily. Apply to inner thighs and buttocks liberally Patient not taking: Reported on 11/21/2022 07/03/22   Rai, Delene Ruffini, MD  oxyCODONE (OXY IR/ROXICODONE) 5 MG immediate release tablet Take 1 tablet (5 mg total) by mouth every 8 (eight) hours as needed for moderate pain or severe pain. Patient not taking: Reported on 03/03/2023 11/23/22   Adam Phenix, PA-C  polyethylene glycol (MIRALAX / GLYCOLAX) 17 g packet Take 17 g by mouth daily as needed for moderate constipation. Patient not taking: Reported on 03/03/2023 11/23/22   Adam Phenix, PA-C  thiamine (VITAMIN B1) 100 MG tablet Take 1 tablet (100 mg total) by mouth daily. Patient not taking: Reported on 11/21/2022 11/20/22   Grayce Sessions, NP  glipiZIDE (GLUCOTROL) 5 MG tablet Take 1 tablet (5 mg total) by mouth daily before breakfast. Patient not taking: Reported on 01/14/2016 01/31/14 01/14/16  Christiane Ha, MD    Physical Exam: Vitals:   03/03/23 0905 03/03/23 1030 03/03/23 1100 03/03/23 1130  BP:  (!) 124/102 124/81 128/70  Pulse:  91  86  Resp:  17 (!) 21 20  Temp:      TempSrc:      SpO2:  96%  98%  Weight: 108.9 kg      Height: 6' (1.829 m)       Constitutional: NAD, calm, comfortable Vitals:   03/03/23 0905 03/03/23 1030 03/03/23 1100 03/03/23 1130  BP:  (!) 124/102 124/81 128/70  Pulse:  91  86  Resp:  17 (!) 21 20  Temp:      TempSrc:      SpO2:  96%  98%  Weight: 108.9 kg     Height: 6' (1.829 m)      Eyes: PERRL, lids and conjunctivae normal ENMT: Mucous membranes are dry. Posterior pharynx clear of any exudate or lesions.Normal dentition.  Neck: normal, supple, no masses, no thyromegaly Respiratory: clear to auscultation bilaterally, no wheezing, no crackles. Normal respiratory effort. No accessory muscle use.  Cardiovascular: Regular rate and rhythm, no murmurs / rubs / gallops. No extremity edema. 2+ pedal pulses. No carotid bruits.  Abdomen: no tenderness, no masses palpated. No hepatosplenomegaly. Bowel sounds positive.  Musculoskeletal: no clubbing / cyanosis. No joint deformity upper and lower extremities. Good ROM, no contractures. Normal muscle tone.  Skin: Chronic venous  stasis like changes with skin thickening, multiple ulcers identified on bilateral feet mainly on both heels, right foot bottom, several smaller shallow ulcers on bilateral calf area.  With foul smell Neurologic: CN 2-12 grossly intact. Sensation intact, DTR normal. Strength 5/5 in all 4.  Psychiatric: Normal judgment and insight. Alert and oriented x 3. Normal mood.        Labs on Admission: I have personally reviewed following labs and imaging studies  CBC: Recent Labs  Lab 03/03/23 0922  WBC 22.4*  NEUTROABS 18.0*  HGB 11.8*  HCT 35.9*  MCV 99.4  PLT 336   Basic Metabolic Panel: Recent Labs  Lab 03/03/23 0922  NA 129*  K 5.4*  CL 99  CO2 15*  GLUCOSE 134*  BUN 94*  CREATININE 2.06*  CALCIUM 9.2   GFR: Estimated Creatinine Clearance: 48.6 mL/min (A) (by C-G formula based on SCr of 2.06 mg/dL (H)). Liver Function Tests: No results for input(s): "AST", "ALT", "ALKPHOS", "BILITOT", "PROT",  "ALBUMIN" in the last 168 hours. No results for input(s): "LIPASE", "AMYLASE" in the last 168 hours. No results for input(s): "AMMONIA" in the last 168 hours. Coagulation Profile: No results for input(s): "INR", "PROTIME" in the last 168 hours. Cardiac Enzymes: No results for input(s): "CKTOTAL", "CKMB", "CKMBINDEX", "TROPONINI" in the last 168 hours. BNP (last 3 results) No results for input(s): "PROBNP" in the last 8760 hours. HbA1C: No results for input(s): "HGBA1C" in the last 72 hours. CBG: No results for input(s): "GLUCAP" in the last 168 hours. Lipid Profile: No results for input(s): "CHOL", "HDL", "LDLCALC", "TRIG", "CHOLHDL", "LDLDIRECT" in the last 72 hours. Thyroid Function Tests: No results for input(s): "TSH", "T4TOTAL", "FREET4", "T3FREE", "THYROIDAB" in the last 72 hours. Anemia Panel: No results for input(s): "VITAMINB12", "FOLATE", "FERRITIN", "TIBC", "IRON", "RETICCTPCT" in the last 72 hours. Urine analysis:    Component Value Date/Time   COLORURINE YELLOW 07/04/2022 1320   APPEARANCEUR HAZY (A) 07/04/2022 1320   LABSPEC 1.012 07/04/2022 1320   PHURINE 5.0 07/04/2022 1320   GLUCOSEU NEGATIVE 07/04/2022 1320   HGBUR MODERATE (A) 07/04/2022 1320   BILIRUBINUR NEGATIVE 07/04/2022 1320   KETONESUR NEGATIVE 07/04/2022 1320   PROTEINUR 100 (A) 07/04/2022 1320   NITRITE NEGATIVE 07/04/2022 1320   LEUKOCYTESUR TRACE (A) 07/04/2022 1320    Radiological Exams on Admission: DG Foot 2 Views Right  Result Date: 03/03/2023 CLINICAL DATA:  Foot ulcers. EXAM: RIGHT FOOT - 2 VIEW COMPARISON:  Right foot radiographs 09/18/2022; MRI right forefoot 06/23/2022 FINDINGS: There is diffuse decreased bone mineralization. There is again a staple overlying the plantar midfoot soft tissues. In this region there is a more superficial soft tissue ulceration measuring approximately 7 mm in craniocaudal depth, similar to prior. There is worsened scattered subcutaneous air extending into the  superficial hindfoot soft tissues just posterior to this ulcer. There is a mild-to-moderate plantar calcaneal heel spur. There may be slightly worsened low bone mineralization seen at the tip of the calcaneal heel spur, and it is difficult to exclude acute osteomyelitis. Mild interphalangeal joint space narrowing diffusely. Mild second tarsometatarsal joint space narrowing. Mild dorsal midfoot degenerative osteophytes on lateral view. No acute fracture. IMPRESSION: There is a staple overlying the plantar midfoot soft tissues with similar overlying anterior midfoot soft tissue ulcer. There is worsened scattered subcutaneous air extending into the superficial/plantar hindfoot soft tissues just posterior to this ulcer. Possible new bone loss at the tip of a plantar calcaneal heel spur, and it is difficult to exclude early acute osteomyelitis  in this region. Electronically Signed   By: Neita Garnet M.D.   On: 03/03/2023 10:33   DG Foot 2 Views Left  Addendum Date: 03/03/2023   ADDENDUM REPORT: 03/03/2023 10:29 IMPRESSION: As described in the findings, there is a new acute fracture of the calcaneus extending anteriorly and superiorly from the region of posterior plantar calcaneal bone erosion that is concerning for osteomyelitis. Electronically Signed   By: Neita Garnet M.D.   On: 03/03/2023 10:29   Result Date: 03/03/2023 CLINICAL DATA:  Foot ulcers. EXAM: LEFT FOOT - 2 VIEW COMPARISON:  Left foot radiographs 06/20/2022 FINDINGS: There is mild soft tissue loss of the distal medial aspect of the great toe which is mildly improved from prior and likely reflects interval at least partial healing of the prior ulcer. There is minimal worsening of the previously seen mild-to-moderate erosion of the distal medial aspect of the distal phalanx of the great toe, now with more chronic appearing partially corticated margin. Minimal great toe metatarsophalangeal joint space narrowing and peripheral osteophytosis. New  high-grade ulceration of the plantar heel in a region measuring up to approximately 7 cm in AP dimension. Lucency from this ulceration is also seen at the medial ankle/hindfoot on frontal view. There is new lucency within the adjacent inferior aspect of the calcaneus in a region measuring approximately 1.4 cm in craniocaudal height. Two new linear longitudinal acute fracture line lucency is extending from this region superiorly and anteriorly to the posterior calcaneal facet at the posterior subtalar joint. Mild dorsal navicular-cuneiform degenerative osteophytes. IMPRESSION: 1. New high-grade ulceration of the plantar heel in a region measuring up to 7 cm in AP dimension. There is new lucency and cortical erosion within the adjacent inferior aspect of the calcaneus in a region measuring approximately 1.4 cm in craniocaudal height. This is suspicious for acute osteomyelitis. 2. There is minimal worsening of the previously seen mild-to-moderate erosion of the distal medial aspect of the great toe distal phalanx. Improvement in the overlying distal medial great toe soft tissue ulcer. This may represent the sequela of the prior active osteomyelitis. It is unclear whether there is any ongoing osteomyelitis in this region. Electronically Signed: By: Neita Garnet M.D. On: 03/03/2023 10:24    EKG: Independently reviewed.  Sinus rhythm, no acute ST changes.  Assessment/Plan Principal Problem:   Osteomyelitis Active Problems:   DM (diabetes mellitus)   Sepsis   Charcot's arthropathy  (please populate well all problems here in Problem List. (For example, if patient is on BP meds at home and you resume or decide to hold them, it is a problem that needs to be her. Same for CAD, COPD, HLD and so on)  Sepsis, severe -Secondary to bilateral feet osteomyelitis diabetic ulcer infection -Evidenced by new leukocytosis, new onset fever, with signs of endorgan damage of AKI -Klim Kaleab appears to volume contracted,  will start IV fluid -Zosyn, check MRSA screening -Orthopedic surgery consult, MRI ordered. NPO after midnight. No significant history of PVD, with most recent ABI study in August 2023 normal ABI on the right side on the right side, noncompressible vessels on the left side -Given the significant worsening of bilateral feet infection and extensive nasal involvement, risk of losing both feet is high, communicated with patient.  Bilateral feet diabetic ulcer infection and osteomyelitis -As above  AKI with non-anion gap metabolic acidosis -Secondary to sepsis -Volume contracted, start IV fluid -Hold off HCTZ/ARB -UA sent  Hyperkalemia -Secondary to AKI, -Significant EKG changes -Lokelma, recheck level  tomorrow  Hyponatremia -Hypovolemic, secondary to AKI, correct volume status first -Re-evaluate Na level in AM  HTN -Hold off home BP meds -As needed hydralazine  IDDM -Hold of long-acting insulin given the AKI -SSI for now  DVT prophylaxis: Heparin subcu Code Status: Full code Family Communication: None at bedside Disposition Plan: Patient is sick with sepsis secondary to bilateral lower extremity diabetic ulcer infection and osteomyelitis requiring IV antibiotics and inpatient podiatry consultation and procedure, expect more than 2 midnight hospital stay Consults called: Dr. Lajoyce Corners Admission status: Tele admit   Emeline General MD [facility] Pager [phone]  03/03/2023, 12:59 PM

## 2023-03-03 NOTE — Telephone Encounter (Signed)
I will call this pt to follow up. He did not go to the ER per advisement.

## 2023-03-03 NOTE — Consult Note (Signed)
Reason for Consult:Foot ulcerations Referring Physician: Glyn Ade Time called: 1000 Time at bedside: 1058   Jesse [name] is an 62 y.o. male.  HPI: Hatem comes in with about a 10d hx/o worsening foot ulcerations and discharge. He's also been having fevers at home.  Past Medical History:  Diagnosis Date   AKI (acute kidney injury) 01/27/2014   Alcohol abuse    Anasarca    Bilateral leg edema 02/07/2021   Cellulitis    Cellulitis and abscess 01/26/2014   Cellulitis of right foot 04/17/2021   Dental abscess 01/26/2014   Diabetes mellitus type 2 in obese    Diabetic foot infection 04/08/2021   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 2 diabetes mellitus, limited to breakdown of skin    Facial cellulitis 01/26/2014   HTN (hypertension)    Hypokalemia 01/28/2014   Infected dental carries 01/26/2014   Leukocytosis 01/27/2014   Osteomyelitis 04/14/2021   Right foot infection     Past Surgical History:  Procedure Laterality Date   I & D EXTREMITY Right 04/18/2021   Procedure: IRRIGATION AND DEBRIDEMENT OF FOOT;  Surgeon: Nadara Mustard, MD;  Location: [facility] OR;  Service: Orthopedics;  Laterality: Right;   I & D EXTREMITY Right 04/20/2021   Procedure: EXCISIONAL DEBRIDEMENT RIGHT FOOT, APPLICATION OF SKIN GRAFT;  Surgeon: Nadara Mustard, MD;  Location: [facility] OR;  Service: Orthopedics;  Laterality: Right;    History reviewed. No pertinent family history.  Social History:  reports that he has never smoked. He has never used smokeless tobacco. He reports that he does not currently use alcohol after a past usage of about 2.0 standard drinks of alcohol per week. He reports that he does not use drugs.  Allergies: No Known Allergies  Medications: I have reviewed the patient's current medications.  Results for orders placed or performed during the hospital encounter of 03/03/23 (from the past 48 hour(s))  CBC with Differential     Status: Abnormal   Collection Time: 03/03/23   9:22 AM  Result Value Ref Range   WBC 22.4 (H) 4.0 - 10.5 K/uL   RBC 3.61 (L) 4.22 - 5.81 MIL/uL   Hemoglobin 11.8 (L) 13.0 - 17.0 g/dL   HCT 10.2 (L) 72.5 - 36.6 %   MCV 99.4 80.0 - 100.0 fL   MCH 32.7 26.0 - 34.0 pg   MCHC 32.9 30.0 - 36.0 g/dL   RDW 44.0 34.7 - 42.5 %   Platelets 336 150 - 400 K/uL   nRBC 0.0 0.0 - 0.2 %   Neutrophils Relative % 81 %   Neutro Abs 18.0 (H) 1.7 - 7.7 K/uL   Lymphocytes Relative 6 %   Lymphs Abs 1.2 0.7 - 4.0 K/uL   Monocytes Relative 11 %   Monocytes Absolute 2.5 (H) 0.1 - 1.0 K/uL   Eosinophils Relative 0 %   Eosinophils Absolute 0.1 0.0 - 0.5 K/uL   Basophils Relative 0 %   Basophils Absolute 0.1 0.0 - 0.1 K/uL   Immature Granulocytes 2 %   Abs Immature Granulocytes 0.43 (H) 0.00 - 0.07 K/uL    Comment: Performed at [facility], [address] [place], [place] [address]  Basic metabolic panel     Status: Abnormal   Collection Time: 03/03/23  9:22 AM  Result Value Ref Range   Sodium 129 (L) 135 - 145 mmol/L   Potassium 5.4 (H) 3.5 - 5.1 mmol/L    Comment: HEMOLYSIS AT THIS  LEVEL MAY AFFECT RESULT   Chloride 99 98 - 111 mmol/L   CO2 15 (L) 22 - 32 mmol/L   Glucose, Bld 134 (H) 70 - 99 mg/dL    Comment: Glucose reference range applies only to samples taken after fasting for at least 8 hours.   BUN 94 (H) 6 - 20 mg/dL   Creatinine, Ser 9.14 (H) 0.61 - 1.24 mg/dL   Calcium 9.2 8.9 - 78.2 mg/dL   GFR, Estimated 36 (L) >60 mL/min    Comment: (NOTE) Calculated using the CKD-EPI Creatinine Equation (2021)    Anion gap 15 5 - 15    Comment: Performed at [facility], [address], [place], [place] [address]    DG Foot 2 Views Right  Result Date: 03/03/2023 CLINICAL DATA:  Foot ulcers. EXAM: RIGHT FOOT - 2 VIEW COMPARISON:  Right foot radiographs 09/18/2022; MRI right forefoot 06/23/2022 FINDINGS: There is diffuse decreased bone mineralization. There is again a staple overlying the plantar midfoot soft tissues. In this region  there is a more superficial soft tissue ulceration measuring approximately 7 mm in craniocaudal depth, similar to prior. There is worsened scattered subcutaneous air extending into the superficial hindfoot soft tissues just posterior to this ulcer. There is a mild-to-moderate plantar calcaneal heel spur. There may be slightly worsened low bone mineralization seen at the tip of the calcaneal heel spur, and it is difficult to exclude acute osteomyelitis. Mild interphalangeal joint space narrowing diffusely. Mild second tarsometatarsal joint space narrowing. Mild dorsal midfoot degenerative osteophytes on lateral view. No acute fracture. IMPRESSION: There is a staple overlying the plantar midfoot soft tissues with similar overlying anterior midfoot soft tissue ulcer. There is worsened scattered subcutaneous air extending into the superficial/plantar hindfoot soft tissues just posterior to this ulcer. Possible new bone loss at the tip of a plantar calcaneal heel spur, and it is difficult to exclude early acute osteomyelitis in this region. Electronically Signed   By: Neita Garnet M.D.   On: 03/03/2023 10:33   DG Foot 2 Views Left  Addendum Date: 03/03/2023   ADDENDUM REPORT: 03/03/2023 10:29 IMPRESSION: As described in the findings, there is a new acute fracture of the calcaneus extending anteriorly and superiorly from the region of posterior plantar calcaneal bone erosion that is concerning for osteomyelitis. Electronically Signed   By: Neita Garnet M.D.   On: 03/03/2023 10:29   Result Date: 03/03/2023 CLINICAL DATA:  Foot ulcers. EXAM: LEFT FOOT - 2 VIEW COMPARISON:  Left foot radiographs 06/20/2022 FINDINGS: There is mild soft tissue loss of the distal medial aspect of the great toe which is mildly improved from prior and likely reflects interval at least partial healing of the prior ulcer. There is minimal worsening of the previously seen mild-to-moderate erosion of the distal medial aspect of the distal  phalanx of the great toe, now with more chronic appearing partially corticated margin. Minimal great toe metatarsophalangeal joint space narrowing and peripheral osteophytosis. New high-grade ulceration of the plantar heel in a region measuring up to approximately 7 cm in AP dimension. Lucency from this ulceration is also seen at the medial ankle/hindfoot on frontal view. There is new lucency within the adjacent inferior aspect of the calcaneus in a region measuring approximately 1.4 cm in craniocaudal height. Two new linear longitudinal acute fracture line lucency is extending from this region superiorly and anteriorly to the posterior calcaneal facet at the posterior subtalar joint. Mild dorsal navicular-cuneiform degenerative osteophytes. IMPRESSION: 1. New high-grade ulceration of  the plantar heel in a region measuring up to 7 cm in AP dimension. There is new lucency and cortical erosion within the adjacent inferior aspect of the calcaneus in a region measuring approximately 1.4 cm in craniocaudal height. This is suspicious for acute osteomyelitis. 2. There is minimal worsening of the previously seen mild-to-moderate erosion of the distal medial aspect of the great toe distal phalanx. Improvement in the overlying distal medial great toe soft tissue ulcer. This may represent the sequela of the prior active osteomyelitis. It is unclear whether there is any ongoing osteomyelitis in this region. Electronically Signed: By: Neita Garnet M.D. On: 03/03/2023 10:24    Review of Systems  Constitutional:  Positive for fever. Negative for chills and diaphoresis.  HENT:  Negative for ear discharge, ear pain, hearing loss and tinnitus.   Eyes:  Negative for photophobia and pain.  Respiratory:  Negative for cough and shortness of breath.   Cardiovascular:  Negative for chest pain.  Gastrointestinal:  Negative for abdominal pain, nausea and vomiting.  Genitourinary:  Negative for dysuria, flank pain, frequency and  urgency.  Musculoskeletal:  Negative for back pain, myalgias and neck pain.  Neurological:  Negative for dizziness and headaches.  Hematological:  Does not bruise/bleed easily.  Psychiatric/Behavioral:  The patient is not nervous/anxious.    Blood pressure 132/77, pulse 90, temperature 97.6 F (36.4 C), temperature source Oral, resp. rate 20, height 6' (1.829 m), weight 108.9 kg. Physical Exam Constitutional:      General: He is not in acute distress.    Appearance: He is well-developed. He is not diaphoretic.  HENT:     Head: Normocephalic and atraumatic.  Eyes:     General: No scleral icterus.       Right eye: No discharge.        Left eye: No discharge.     Conjunctiva/sclera: Conjunctivae normal.  Cardiovascular:     Rate and Rhythm: Normal rate and regular rhythm.  Pulmonary:     Effort: Pulmonary effort is normal. No respiratory distress.  Musculoskeletal:     Cervical back: Normal range of motion.  Feet:     Comments: Right foot -- Plantar ulceration with malodor, purulent discharge, SPN/DPN/TN paresthetic to absent, 0 DP/PT  Left foot -- Calcaneal ulceration with maggot infestation, malodor and purulent discharge, SPN/DPN/TN paresthetic to absent, 0 DP/PT Skin:    General: Skin is warm and dry.  Neurological:     Mental Status: He is alert.  Psychiatric:        Mood and Affect: Mood normal.        Behavior: Behavior normal.     Assessment/Plan: Bilateral foot ulcerations -- Will try to get MRI's of both feet. Needs vascular consult given abnormal ABI 6 months ago. Dr. Lajoyce Corners to evaluate later today or in AM.    Freeman Caldron, PA-C Orthopedic Surgery [phone] 03/03/2023, 11:06 AM

## 2023-03-03 NOTE — ED Notes (Signed)
I cleaned pt's wounds with soap and water and put a dry dressing on them

## 2023-03-04 DIAGNOSIS — I890 Lymphedema, not elsewhere classified: Secondary | ICD-10-CM | POA: Diagnosis not present

## 2023-03-04 DIAGNOSIS — E43 Unspecified severe protein-calorie malnutrition: Secondary | ICD-10-CM

## 2023-03-04 DIAGNOSIS — M86272 Subacute osteomyelitis, left ankle and foot: Secondary | ICD-10-CM | POA: Diagnosis not present

## 2023-03-04 DIAGNOSIS — M86171 Other acute osteomyelitis, right ankle and foot: Secondary | ICD-10-CM | POA: Diagnosis not present

## 2023-03-04 LAB — BASIC METABOLIC PANEL
Anion gap: 9 (ref 5–15)
BUN: 67 mg/dL — ABNORMAL HIGH (ref 6–20)
CO2: 16 mmol/L — ABNORMAL LOW (ref 22–32)
Calcium: 8.3 mg/dL — ABNORMAL LOW (ref 8.9–10.3)
Chloride: 108 mmol/L (ref 98–111)
Creatinine, Ser: 1.42 mg/dL — ABNORMAL HIGH (ref 0.61–1.24)
GFR, Estimated: 57 mL/min — ABNORMAL LOW (ref >60)
Glucose, Bld: 134 mg/dL — ABNORMAL HIGH (ref 70–99)
Potassium: 4.2 mmol/L (ref 3.5–5.1)
Sodium: 133 mmol/L — ABNORMAL LOW (ref 135–145)

## 2023-03-04 LAB — SURGICAL PCR SCREEN
MRSA, PCR: NEGATIVE
Staphylococcus aureus: POSITIVE — AB

## 2023-03-04 LAB — CBC
HCT: 32.7 % — ABNORMAL LOW (ref 39.0–52.0)
Hemoglobin: 11.2 g/dL — ABNORMAL LOW (ref 13.0–17.0)
MCH: 32.7 pg (ref 26.0–34.0)
MCHC: 34.3 g/dL (ref 30.0–36.0)
MCV: 95.6 fL (ref 80.0–100.0)
Platelets: 317 10*3/uL (ref 150–400)
RBC: 3.42 MIL/uL — ABNORMAL LOW (ref 4.22–5.81)
RDW: 14.4 % (ref 11.5–15.5)
WBC: 18.1 10*3/uL — ABNORMAL HIGH (ref 4.0–10.5)
nRBC: 0 % (ref 0.0–0.2)

## 2023-03-04 LAB — PROTIME-INR
INR: 2.1 — ABNORMAL HIGH (ref 0.8–1.2)
Prothrombin Time: 23 seconds — ABNORMAL HIGH (ref 11.4–15.2)

## 2023-03-04 LAB — GLUCOSE, CAPILLARY
Glucose-Capillary: 113 mg/dL — ABNORMAL HIGH (ref 70–99)
Glucose-Capillary: 140 mg/dL — ABNORMAL HIGH (ref 70–99)
Glucose-Capillary: 101 mg/dL — ABNORMAL HIGH (ref 70–99)
Glucose-Capillary: 122 mg/dL — ABNORMAL HIGH (ref 70–99)

## 2023-03-04 LAB — C DIFFICILE QUICK SCREEN W PCR REFLEX
C Diff antigen: NEGATIVE
C Diff interpretation: NOT DETECTED
C Diff toxin: NEGATIVE

## 2023-03-04 LAB — CULTURE, BLOOD (ROUTINE X 2): Culture: NO GROWTH

## 2023-03-04 MED ORDER — GUAIFENESIN 100 MG/5ML PO LIQD: 5.0000 mL | ORAL | Status: DC | PRN

## 2023-03-04 MED ORDER — IPRATROPIUM-ALBUTEROL 0.5-2.5 (3) MG/3ML IN SOLN: 3.0000 mL | RESPIRATORY_TRACT | Status: DC | PRN

## 2023-03-04 MED ORDER — PHYTONADIONE 5 MG PO TABS
10.0000 mg | ORAL_TABLET | Freq: Once | ORAL | Status: AC
  Administered 2023-03-04: 10 mg via ORAL
  Filled 2023-03-04: qty 2

## 2023-03-04 MED ORDER — SENNOSIDES-DOCUSATE SODIUM 8.6-50 MG PO TABS: 2.0000 | ORAL_TABLET | Freq: Every evening | ORAL | Status: DC | PRN

## 2023-03-04 MED ORDER — TRAZODONE HCL 50 MG PO TABS
50.0000 mg | ORAL_TABLET | Freq: Every evening | ORAL | Status: DC | PRN
  Administered 2023-03-05: 50 mg via ORAL
  Filled 2023-03-04: qty 1

## 2023-03-04 MED ORDER — SODIUM CHLORIDE 0.9 % IV SOLN: INTRAVENOUS | Status: AC

## 2023-03-04 NOTE — Consult Note (Addendum)
Hospital Consult    Reason for Consult:  BLE wounds Requesting Physician:  Dr. Lajoyce Corners MRN #:  [id number]  History of Present Illness: This is a 62 y.o. male with past medical history significant for hypertension, hyperlipidemia, insulin-dependent diabetes mellitus, lymphedema, and PAD.  He presented to the emergency department with worsening bilateral lower extremity heel wounds.  He was found to have maggot infestation of his left heel and foot.  He has radiographic evidence of osteomyelitis of both heels left worse than right.  As of August 2023 he had falsely elevated ABIs bilaterally with abnormal right great toe pressure.  He uses a wheelchair around his house however states he is able to walk.  He is also being treated for acute kidney injury.  INR also elevated likely related to liver dysfunction.  He denies rest pain of bilateral lower extremities.  Dr. Lajoyce Corners has recommended left below the knee amputation however patient is currently not willing to consent.  Past Medical History:  Diagnosis Date   AKI (acute kidney injury) 01/27/2014   Alcohol abuse    Anasarca    Bilateral leg edema 02/07/2021   Cellulitis    Cellulitis and abscess 01/26/2014   Cellulitis of right foot 04/17/2021   Dental abscess 01/26/2014   Diabetes mellitus type 2 in obese    Diabetic foot infection 04/08/2021   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 2 diabetes mellitus, limited to breakdown of skin    Facial cellulitis 01/26/2014   HTN (hypertension)    Hypokalemia 01/28/2014   Infected dental carries 01/26/2014   Leukocytosis 01/27/2014   Osteomyelitis 04/14/2021   Right foot infection     Past Surgical History:  Procedure Laterality Date   I & D EXTREMITY Right 04/18/2021   Procedure: IRRIGATION AND DEBRIDEMENT OF FOOT;  Surgeon: Nadara Mustard, MD;  Location: [facility] OR;  Service: Orthopedics;  Laterality: Right;   I & D EXTREMITY Right 04/20/2021   Procedure: EXCISIONAL DEBRIDEMENT RIGHT FOOT,  APPLICATION OF SKIN GRAFT;  Surgeon: Nadara Mustard, MD;  Location: [facility] OR;  Service: Orthopedics;  Laterality: Right;    No Known Allergies  Prior to Admission medications   Medication Sig Start Date End Date Taking? Authorizing Provider  acetaminophen (TYLENOL) 500 MG tablet Take 2 tablets (1,000 mg total) by mouth every 8 (eight) hours. 11/23/22  Yes Simaan, Francine Graven, PA-C  dorzolamide-timolol (COSOPT) 2-0.5 % ophthalmic solution Place 1 drop into the right eye 2 (two) times daily. 11/23/22  Yes Simaan, Francine Graven, PA-C  gabapentin (NEURONTIN) 100 MG capsule Take 1 capsule (100 mg total) by mouth 3 (three) times daily. 11/20/22 03/03/23 Yes Edwards, Kinnie Scales, NP  insulin glargine (LANTUS) 100 UNIT/ML Solostar Pen INJECT 10 UNITS INTO THE SKIN DAILY. 11/20/22  Yes Grayce Sessions, NP  Insulin Pen Needle 32G X 4 MM MISC Use as directed with insulin pen 07/03/22  Yes Rai, Ripudeep K, MD  levETIRAcetam (KEPPRA) 500 MG tablet Take 1 tablet (500 mg total) by mouth 2 (two) times daily for 5 days. 11/23/22 03/03/23 Yes Simaan, Francine Graven, PA-C  valsartan-hydrochlorothiazide (DIOVAN-HCT) 160-25 MG tablet Take 1 tablet by mouth daily. 11/20/22  Yes Grayce Sessions, NP  erythromycin ophthalmic ointment Place a 1/2 inch ribbon of ointment into the lower eyelid three times daily. Patient not taking: Reported on 03/03/2023 12/02/22   Maury Dus, MD  methocarbamol (ROBAXIN) 750 MG tablet Take 1 tablet (750 mg total) by mouth every 6 (six) hours  as needed for muscle spasms. Patient not taking: Reported on 03/03/2023 11/23/22   Adam Phenix, PA-C  Nystatin (GERHARDT'S BUTT CREAM) CREA Apply 1 Application topically 3 (three) times daily. Apply to inner thighs and buttocks liberally Patient not taking: Reported on 11/21/2022 07/03/22   Rai, Delene Ruffini, MD  oxyCODONE (OXY IR/ROXICODONE) 5 MG immediate release tablet Take 1 tablet (5 mg total) by mouth every 8 (eight) hours as needed for moderate pain  or severe pain. Patient not taking: Reported on 03/03/2023 11/23/22   Adam Phenix, PA-C  polyethylene glycol (MIRALAX / GLYCOLAX) 17 g packet Take 17 g by mouth daily as needed for moderate constipation. Patient not taking: Reported on 03/03/2023 11/23/22   Adam Phenix, PA-C  thiamine (VITAMIN B1) 100 MG tablet Take 1 tablet (100 mg total) by mouth daily. Patient not taking: Reported on 11/21/2022 11/20/22   Grayce Sessions, NP  glipiZIDE (GLUCOTROL) 5 MG tablet Take 1 tablet (5 mg total) by mouth daily before breakfast. Patient not taking: Reported on 01/14/2016 01/31/14 01/14/16  Christiane Ha, MD    Social History   Socioeconomic History   Marital status: Single    Spouse name: Not on file   Number of children: Not on file   Years of education: 10th grade   Highest education level: Not on file  Occupational History   Occupation: Curator  Tobacco Use   Smoking status: Never   Smokeless tobacco: Never  Vaping Use   Vaping Use: Never used  Substance and Sexual Activity   Alcohol use: Not Currently    Alcohol/week: 2.0 standard drinks of alcohol    Types: 2 Cans of beer per week    Comment: Occasional   Drug use: No   Sexual activity: Not Currently    Partners: Female  Other Topics Concern   Not on file  Social History Narrative   Not on file   Social Determinants of Health   Financial Resource Strain: Not on file  Food Insecurity: Food Insecurity Present (03/03/2023)   Hunger Vital Sign    Worried About Running Out of Food in the Last Year: Often true    Ran Out of Food in the Last Year: Often true  Transportation Needs: No Transportation Needs (03/03/2023)   PRAPARE - Administrator, Civil Service (Medical): No    Lack of Transportation (Non-Medical): No  Physical Activity: Not on file  Stress: Not on file  Social Connections: Not on file  Intimate Partner Violence: Not At Risk (03/03/2023)   Humiliation, Afraid, Rape, and Kick  questionnaire    Fear of Current or Ex-Partner: No    Emotionally Abused: No    Physically Abused: No    Sexually Abused: No     History reviewed. No pertinent family history.  ROS: Otherwise negative unless mentioned in HPI  Physical Examination  Vitals:   03/04/23 0509 03/04/23 0904  BP: 110/65 104/68  Pulse: 85 77  Resp: 17 18  Temp: 98 F (36.7 C) 97.9 F (36.6 C)  SpO2: 95% 98%   Body mass index is 29.75 kg/m.  General:  WDWN in NAD Gait: Not observed HENT: WNL, normocephalic Pulmonary: normal non-labored breathing, without Rales, rhonchi,  wheezing Cardiac: regular Abdomen:  soft, NT/ND, no masses Skin: without rashes Vascular Exam/Pulses: no palpable pedal pulses; palpable femoral pulses bilaterally Extremities: Bilateral heel wounds; brawny induration and lymphedema of bilateral lower extremities Musculoskeletal: no muscle wasting or atrophy  Neurologic: A&O X  3;  No focal weakness or paresthesias are detected; speech is fluent/normal Psychiatric:  The pt has Normal affect. Lymph:  Unremarkable  CBC    Component Value Date/Time   WBC 18.1 (H) 03/04/2023 0430   RBC 3.42 (L) 03/04/2023 0430   HGB 11.2 (L) 03/04/2023 0430   HCT 32.7 (L) 03/04/2023 0430   PLT 317 03/04/2023 0430   MCV 95.6 03/04/2023 0430   MCH 32.7 03/04/2023 0430   MCHC 34.3 03/04/2023 0430   RDW 14.4 03/04/2023 0430   LYMPHSABS 1.2 03/03/2023 0922   MONOABS 2.5 (H) 03/03/2023 0922   EOSABS 0.1 03/03/2023 0922   BASOSABS 0.1 03/03/2023 0922    BMET    Component Value Date/Time   NA 133 (L) 03/04/2023 0430   K 4.2 03/04/2023 0430   CL 108 03/04/2023 0430   CO2 16 (L) 03/04/2023 0430   GLUCOSE 134 (H) 03/04/2023 0430   BUN 67 (H) 03/04/2023 0430   CREATININE 1.42 (H) 03/04/2023 0430   CALCIUM 8.3 (L) 03/04/2023 0430   GFRNONAA 57 (L) 03/04/2023 0430   GFRAA >60 01/14/2016 1534    COAGS: Lab Results  Component Value Date   INR 2.1 (H) 03/04/2023   INR 1.8 (H) 03/03/2023    INR 1.6 (H) 06/23/2022     Non-Invasive Vascular Imaging:   ABI pending  ABI as of August 2023 with falsely elevated ABIs bilaterally   ASSESSMENT/PLAN: This is a 62 y.o. male with bilateral lower extremity wounds who presented with sepsis  -Jesse [name] is a 62 year old male with extensive bilateral lower extremity wounds left worse than right.  He was found to have maggot infestation of his left heel.  On plain film, he has a left calcaneal fracture secondary to extensive osteomyelitis.  Due to the extensive nature of tissue loss and osteomyelitis, Dr. Lajoyce Corners recommended left below the knee amputation.  I discussed with the patient that even with perfect blood flow to the left foot, he will still likely require amputation.  As for the right lower extremity, plain film suggested osteomyelitis of the right heel however this was not seen on MR.  We will allow his INR to normalize.  We will allow the primary team time to treat AKI.  We will consider right lower extremity angiogram as part of limb salvage once he has been optimized medically.  On-call vascular surgeon Jesse [name] will evaluate the patient later today and provide further treatment plans.   Jesse Rutter PA-C [facility] [phone]  I agree with the above.  I have seen and evaluated the patient.  Briefly this is a 62 year old gentleman with multiple comorbidities and bilateral lower extremity wounds who presented with sepsis.  He had maggots on the left heel and has been evaluated by Dr. Lajoyce Corners who recommends left below-knee amputation, for which the patient is not yet agreeable to.  He also has significant lower extremity edema.  He has had foot abscesses in the past.  He is a diabetic.  His most recent ABIs were 6 months ago.  His waveforms are multiphasic but vessels are noncompressible.  His toe pressure on the right was abnormal.  On exam he has palpable femoral pulses but pedal pulses not palpable likely from  his edema.  In an attempt to make sure he has optimized blood flow for limb salvage, I recommended proceeding with angiography.  Of note he does have liver insufficiency and elevated INR for which she is receiving vitamin K.  If his  INR is acceptable, we can do his angiogram tomorrow.  This would be through a left femoral approach with intervention on the right leg if indicated.  We will also evaluate blood flow to his left leg.  He will be n.p.o. after midnight.  I discussed the risks and benefits of the procedure with the patient and he wishes to proceed  Jesse [name]

## 2023-03-04 NOTE — Plan of Care (Signed)

## 2023-03-04 NOTE — Consult Note (Addendum)
ORTHOPAEDIC CONSULTATION  REQUESTING PHYSICIAN: Dimple Nanas, MD  Chief Complaint: Fever and chills with ulceration of both feet.  Patient states that the floor and his rental was infested and had splinters.  Patient believes his infection developed from splinters in his foot from the infested home.  HPI: Jesse [name] is a 62 y.o. male who presents with necrotic left heel ulcer with maggots with chronic right foot ulcer and venous and lymphatic insufficiency with swelling.  Patient is status post irrigation debridement of right foot infection several years ago.  Past Medical History:  Diagnosis Date   AKI (acute kidney injury) 01/27/2014   Alcohol abuse    Anasarca    Bilateral leg edema 02/07/2021   Cellulitis    Cellulitis and abscess 01/26/2014   Cellulitis of right foot 04/17/2021   Dental abscess 01/26/2014   Diabetes mellitus type 2 in obese    Diabetic foot infection 04/08/2021   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 2 diabetes mellitus, limited to breakdown of skin    Facial cellulitis 01/26/2014   HTN (hypertension)    Hypokalemia 01/28/2014   Infected dental carries 01/26/2014   Leukocytosis 01/27/2014   Osteomyelitis 04/14/2021   Right foot infection    Past Surgical History:  Procedure Laterality Date   I & D EXTREMITY Right 04/18/2021   Procedure: IRRIGATION AND DEBRIDEMENT OF FOOT;  Surgeon: Nadara Mustard, MD;  Location: [facility] OR;  Service: Orthopedics;  Laterality: Right;   I & D EXTREMITY Right 04/20/2021   Procedure: EXCISIONAL DEBRIDEMENT RIGHT FOOT, APPLICATION OF SKIN GRAFT;  Surgeon: Nadara Mustard, MD;  Location: [facility] OR;  Service: Orthopedics;  Laterality: Right;   Social History   Socioeconomic History   Marital status: Single    Spouse name: Not on file   Number of children: Not on file   Years of education: 10th grade   Highest education level: Not on file  Occupational History   Occupation: Curator  Tobacco Use   Smoking  status: Never   Smokeless tobacco: Never  Vaping Use   Vaping Use: Never used  Substance and Sexual Activity   Alcohol use: Not Currently    Alcohol/week: 2.0 standard drinks of alcohol    Types: 2 Cans of beer per week    Comment: Occasional   Drug use: No   Sexual activity: Not Currently    Partners: Female  Other Topics Concern   Not on file  Social History Narrative   Not on file   Social Determinants of Health   Financial Resource Strain: Not on file  Food Insecurity: Food Insecurity Present (03/03/2023)   Hunger Vital Sign    Worried About Running Out of Food in the Last Year: Often true    Ran Out of Food in the Last Year: Often true  Transportation Needs: No Transportation Needs (03/03/2023)   PRAPARE - Administrator, Civil Service (Medical): No    Lack of Transportation (Non-Medical): No  Physical Activity: Not on file  Stress: Not on file  Social Connections: Not on file   History reviewed. No pertinent family history. - negative except otherwise stated in the family history section No Known Allergies Prior to Admission medications   Medication Sig Start Date End Date Taking? Authorizing Provider  acetaminophen (TYLENOL) 500 MG tablet Take 2 tablets (1,000 mg total) by mouth every 8 (eight) hours. 11/23/22  Yes Simaan, Francine Graven, PA-C  dorzolamide-timolol (COSOPT) 2-0.5 %  ophthalmic solution Place 1 drop into the right eye 2 (two) times daily. 11/23/22  Yes Simaan, Francine Graven, PA-C  gabapentin (NEURONTIN) 100 MG capsule Take 1 capsule (100 mg total) by mouth 3 (three) times daily. 11/20/22 03/03/23 Yes Edwards, Kinnie Scales, NP  insulin glargine (LANTUS) 100 UNIT/ML Solostar Pen INJECT 10 UNITS INTO THE SKIN DAILY. 11/20/22  Yes Grayce Sessions, NP  Insulin Pen Needle 32G X 4 MM MISC Use as directed with insulin pen 07/03/22  Yes Rai, Ripudeep K, MD  levETIRAcetam (KEPPRA) 500 MG tablet Take 1 tablet (500 mg total) by mouth 2 (two) times daily for 5 days.  11/23/22 03/03/23 Yes Simaan, Francine Graven, PA-C  valsartan-hydrochlorothiazide (DIOVAN-HCT) 160-25 MG tablet Take 1 tablet by mouth daily. 11/20/22  Yes Grayce Sessions, NP  erythromycin ophthalmic ointment Place a 1/2 inch ribbon of ointment into the lower eyelid three times daily. Patient not taking: Reported on 03/03/2023 12/02/22   Maury Dus, MD  methocarbamol (ROBAXIN) 750 MG tablet Take 1 tablet (750 mg total) by mouth every 6 (six) hours as needed for muscle spasms. Patient not taking: Reported on 03/03/2023 11/23/22   Adam Phenix, PA-C  Nystatin (GERHARDT'S BUTT CREAM) CREA Apply 1 Application topically 3 (three) times daily. Apply to inner thighs and buttocks liberally Patient not taking: Reported on 11/21/2022 07/03/22   Rai, Delene Ruffini, MD  oxyCODONE (OXY IR/ROXICODONE) 5 MG immediate release tablet Take 1 tablet (5 mg total) by mouth every 8 (eight) hours as needed for moderate pain or severe pain. Patient not taking: Reported on 03/03/2023 11/23/22   Adam Phenix, PA-C  polyethylene glycol (MIRALAX / GLYCOLAX) 17 g packet Take 17 g by mouth daily as needed for moderate constipation. Patient not taking: Reported on 03/03/2023 11/23/22   Adam Phenix, PA-C  thiamine (VITAMIN B1) 100 MG tablet Take 1 tablet (100 mg total) by mouth daily. Patient not taking: Reported on 11/21/2022 11/20/22   Grayce Sessions, NP  glipiZIDE (GLUCOTROL) 5 MG tablet Take 1 tablet (5 mg total) by mouth daily before breakfast. Patient not taking: Reported on 01/14/2016 01/31/14 01/14/16  Christiane Ha, MD   DG Foot 2 Views Right  Result Date: 03/03/2023 CLINICAL DATA:  Foot ulcers. EXAM: RIGHT FOOT - 2 VIEW COMPARISON:  Right foot radiographs 09/18/2022; MRI right forefoot 06/23/2022 FINDINGS: There is diffuse decreased bone mineralization. There is again a staple overlying the plantar midfoot soft tissues. In this region there is a more superficial soft tissue ulceration measuring  approximately 7 mm in craniocaudal depth, similar to prior. There is worsened scattered subcutaneous air extending into the superficial hindfoot soft tissues just posterior to this ulcer. There is a mild-to-moderate plantar calcaneal heel spur. There may be slightly worsened low bone mineralization seen at the tip of the calcaneal heel spur, and it is difficult to exclude acute osteomyelitis. Mild interphalangeal joint space narrowing diffusely. Mild second tarsometatarsal joint space narrowing. Mild dorsal midfoot degenerative osteophytes on lateral view. No acute fracture. IMPRESSION: There is a staple overlying the plantar midfoot soft tissues with similar overlying anterior midfoot soft tissue ulcer. There is worsened scattered subcutaneous air extending into the superficial/plantar hindfoot soft tissues just posterior to this ulcer. Possible new bone loss at the tip of a plantar calcaneal heel spur, and it is difficult to exclude early acute osteomyelitis in this region. Electronically Signed   By: Neita Garnet M.D.   On: 03/03/2023 10:33   DG Foot 2 Views Left  Addendum Date: 03/03/2023   ADDENDUM REPORT: 03/03/2023 10:29 IMPRESSION: As described in the findings, there is a new acute fracture of the calcaneus extending anteriorly and superiorly from the region of posterior plantar calcaneal bone erosion that is concerning for osteomyelitis. Electronically Signed   By: Neita Garnet M.D.   On: 03/03/2023 10:29   Result Date: 03/03/2023 CLINICAL DATA:  Foot ulcers. EXAM: LEFT FOOT - 2 VIEW COMPARISON:  Left foot radiographs 06/20/2022 FINDINGS: There is mild soft tissue loss of the distal medial aspect of the great toe which is mildly improved from prior and likely reflects interval at least partial healing of the prior ulcer. There is minimal worsening of the previously seen mild-to-moderate erosion of the distal medial aspect of the distal phalanx of the great toe, now with more chronic appearing  partially corticated margin. Minimal great toe metatarsophalangeal joint space narrowing and peripheral osteophytosis. New high-grade ulceration of the plantar heel in a region measuring up to approximately 7 cm in AP dimension. Lucency from this ulceration is also seen at the medial ankle/hindfoot on frontal view. There is new lucency within the adjacent inferior aspect of the calcaneus in a region measuring approximately 1.4 cm in craniocaudal height. Two new linear longitudinal acute fracture line lucency is extending from this region superiorly and anteriorly to the posterior calcaneal facet at the posterior subtalar joint. Mild dorsal navicular-cuneiform degenerative osteophytes. IMPRESSION: 1. New high-grade ulceration of the plantar heel in a region measuring up to 7 cm in AP dimension. There is new lucency and cortical erosion within the adjacent inferior aspect of the calcaneus in a region measuring approximately 1.4 cm in craniocaudal height. This is suspicious for acute osteomyelitis. 2. There is minimal worsening of the previously seen mild-to-moderate erosion of the distal medial aspect of the great toe distal phalanx. Improvement in the overlying distal medial great toe soft tissue ulcer. This may represent the sequela of the prior active osteomyelitis. It is unclear whether there is any ongoing osteomyelitis in this region. Electronically Signed: By: Neita Garnet M.D. On: 03/03/2023 10:24   - pertinent xrays, CT, MRI studies were reviewed and independently interpreted  Positive ROS: All other systems have been reviewed and were otherwise negative with the exception of those mentioned in the HPI and as above.  Physical Exam: General: Alert, no acute distress Psychiatric: Patient is competent for consent with normal mood and affect Lymphatic: No axillary or cervical lymphadenopathy Cardiovascular: No pedal edema Respiratory: No cyanosis, no use of accessory musculature GI: No organomegaly,  abdomen is soft and non-tender    Images:  @ENCIMAGES @  Labs:  Lab Results  Component Value Date   HGBA1C 6.9 (H) 11/21/2022   HGBA1C 6.3 10/10/2022   HGBA1C 8.3 (H) 06/20/2022   ESRSEDRATE 132 (H) 03/03/2023   ESRSEDRATE 137 (H) 07/08/2022   ESRSEDRATE 107 (H) 06/27/2022   CRP 14.1 (H) 07/08/2022   CRP 16.9 (H) 06/27/2022   CRP 27.2 (H) 04/08/2021   LABURIC 5.3 07/04/2022   LABURIC 7.0 01/14/2016   REPTSTATUS PENDING 03/03/2023   GRAMSTAIN NO WBC SEEN NO ORGANISMS SEEN  04/18/2021   CULT  03/03/2023    NO GROWTH < 24 HOURS Performed at [facility], [address] [place], [place] [address]    Imelda Pillow PROTEUS MIRABILIS 04/18/2021    Lab Results  Component Value Date   ALBUMIN 2.1 (L) 03/03/2023   ALBUMIN 3.1 (L) 11/22/2022   ALBUMIN 3.5 11/21/2022   LABURIC 5.3 07/04/2022  LABURIC 7.0 01/14/2016        Latest Ref Rng & Units 03/04/2023    4:30 AM 03/03/2023    9:22 AM 12/02/2022   12:25 PM  CBC EXTENDED  WBC 4.0 - 10.5 K/uL 18.1  22.4  5.4   RBC 4.22 - 5.81 MIL/uL 3.42  3.61  3.75   Hemoglobin 13.0 - 17.0 g/dL 16.1  09.6  04.5   HCT 39.0 - 52.0 % 32.7  35.9  36.7   Platelets 150 - 400 K/uL 317  336  279   NEUT# 1.7 - 7.7 K/uL  18.0  3.5   Lymph# 0.7 - 4.0 K/uL  1.2  1.2     Neurologic: Patient does not have protective sensation bilateral lower extremities.   MUSCULOSKELETAL:   Skin: Examination patient has a large necrotic ulcer involving the entire heel.  The ulcer is necrotic probes to bone.  There are too numerous to count maggot infestation of the left heel.  Radiographs of the left foot shows calcaneal fracture secondary to underlying osteomyelitis with air in the soft tissue extending down to the calcaneus.  There is also chronic osteomyelitis of the tuft of the left great toe.  Examination of the right lower extremity the MRI scan does not show any bony infection of the midfoot.  Radiograph shows a retained staple from previous skin  grafting.  Patient has brawny lymphedema both lower extremities with hyperkeratosis hyperpigmentation and elephantitis.  White cell count 18.1 with a hemoglobin of 11.2.  Albumin 2.1.  Hemoglobin A1c in January was 6.9.  Sed rate 132.  Ankle-brachial indices 6 months ago showed multiphasic flow in both lower extremities with ABIs greater than 1.  Assessment: Assessment: Sepsis with gangrene and osteomyelitis left heel with maggot infestation with stable ulcer on the right foot with venous and lymphatic insufficiency both lower extremities with severe protein caloric malnutrition.  Plan: Recommended proceeding with a below-knee amputation on the left.  Risk and benefits were discussed including increased mortality risk without surgery.  Patient states he understands he states that he would like to make a decision regarding surgery.  He is not ready to proceed with surgery at this time.  I will write an order for diabetic diet and will post for surgery tomorrow.  Patient still may require some time and may need to proceed with surgery on Friday.   Thank you for the consult and the opportunity to see Mr. Gilford Rile, MD [facility] [phone] 8:03 AM

## 2023-03-04 NOTE — Consult Note (Signed)
WOC Nurse Consult Note: Reason for Consult: foot wounds. History of lyphadema, PAD, HTN, DM.  Known to have osteomyelitis in the left calcaneous and suspected osteomyelitics  in the right heel.  He has been seen by both orthopedics and vascular.  Orthopedics has recommended BKA on the left. VVS is working up the RLE for salvage opportunities.    Wound type: Neuropathic in the setting of PAD Pressure Injury POA: NA Measurement:see nursing flow sheets Wound ZOX:[id number] bilateral heel ulcers  Drainage (amount, consistency, odor) foul, purulent, with odor per VVS and ortho and nursing notes Periwound:macerated  Dressing procedure/placement/frequency: Conservative dressings ordered in the presence of known PAD. Patient has not consented to BKA on the left despite recommendations from both VVS and orthopedics.  Verified with both ortho and VVS conservative dressings are applicable in the situation.  Offload heels with Prevalon boots bilaterally.    Re consult if needed, will not follow at this time. Thanks  Ebany [employer]aster [employer], RN,CWOCN, CNS, CWON-AP [phone])

## 2023-03-04 NOTE — Progress Notes (Signed)
MRI team called to inquire if Jesse [name] would consent to MRI today. He said yes. They sent a porter who picked him up and took him for the procedure. Unfortunately while down there, he declined to be moved from his bed to any other place for the procedure so it was deemed impossible. The MRI team have called this RN to inform them.

## 2023-03-04 NOTE — Progress Notes (Signed)
PROGRESS NOTE    Jesse [name]  HQI:[id number] DOB: [birth year] DOA: 03/03/2023 PCP: Grayce Sessions, NP   Brief Narrative:  a 62 y.o. male with medical history significant of HTN, HLD, IDDM, diabetic neuropathy, chronic bilateral feet diabetic ulcers, chronic venous stasis and lymphedema, presented with worsening of bilateral feet infection.  Patient follows outpatient wound care and orthopedic and has failed several rounds of p.o. antibiotics including I&D.  Now admitted for bilateral lower extremity osteomyelitis, orthopedic following.   Assessment & Plan:  Principal Problem:   Osteomyelitis Active Problems:   DM (diabetes mellitus)   Sepsis   Charcot's arthropathy   Lymphedema    Severe sepsis secondary to bilateral lower extremity feet osteomyelitis, diabetic foot ulcer - Sepsis physiology slowly improving.  Continue IV fluids, empiric IV Zosyn. - N.p.o., MRI foot ordered - Orthopedic following, I have consulted Dr. Myra Gianotti from vascular as well. depending on surgical plan and approach, may also require infectious disease consultation. ABI in August 2023 showed noncompressible vessels of left lower extremity.  Difficult to obtain TBI secondary to the size   AKI with non-anion gap metabolic acidosis -Admission creatinine 2.06, slowly improving.  Continue IV fluids.     Hyperkalemia -Received Lokelma now resolved   Hyponatremia -Improved   HTN -Home medications-Diovan on hold.  IV as needed   Uncontrolled insulin-dependent diabetes mellitus type 2 -Home Lantus on hold.  Currently on sliding scale Accu-Chek.  Will adjust as necessary.  Prior history of alcohol abuse Coagulopathy -INR elevated at 2.1.  Not on any home anticoagulation.  Vitamin K  Very poor living conditions at home, TOC notified   DVT prophylaxis: Continue subcu heparin Code Status: Full code Family Communication: None at bedside Consults called: Dr. Lajoyce Corners, vascular  Ongoing evaluation for  significant osteomyelitis/infection of the lateral lower extremities      Diet Orders (From admission, onward)     Start     Ordered   03/04/23 0835  Diet Carb Modified Fluid consistency: Thin; Room service appropriate? Yes  Diet effective now       Question Answer Comment  Diet-HS Snack? Nothing   Calorie Level Medium 1600-2000   Fluid consistency: Thin   Room service appropriate? Yes      03/04/23 0834            Subjective: Patient is any complaints during my visit.  Tells me he has very poor living conditions at home which led him to current issues.  Denies any nausea, vomiting, chest pain.   Examination:  General exam: Appears calm and comfortable  Respiratory system: Clear to auscultation. Respiratory effort normal. Cardiovascular system: S1 & S2 heard, RRR. No JVD, murmurs, rubs, gallops or clicks. No pedal edema. Gastrointestinal system: Abdomen is nondistended, soft and nontender. No organomegaly or masses felt. Normal bowel sounds heard. Central nervous system: Alert and oriented. No focal neurological deficits. Extremities: Symmetric 5 x 5 power. Skin: Bilateral lower extremities: Swelling and erythema.  There is evidence of large necrotic ulcer involving entire right heel, numerous maggots.  Chronic evidence of left great toe osteomyelitis Psychiatry: Judgement and insight appear normal. Mood & affect appropriate.  Objective: Vitals:   03/03/23 1823 03/03/23 2035 03/04/23 0509 03/04/23 0904  BP:   110/65 104/68  Pulse:  85 85 77  Resp:  20 17 18   Temp: 98.7 F (37.1 C) (!) 97.4 F (36.3 C) 98 F (36.7 C) 97.9 F (36.6 C)  TempSrc: Oral Oral  Oral  SpO2:  99%  95% 98%  Weight:  99.5 kg    Height:  6' (1.829 m)      Intake/Output Summary (Last 24 hours) at 03/04/2023 1106 Last data filed at 03/04/2023 0906 Gross per 24 hour  Intake 2034.26 ml  Output 1350 ml  Net 684.26 ml   Filed Weights   03/03/23 0905 03/03/23 2035  Weight: 108.9 kg 99.5 kg     Scheduled Meds:  acetaminophen  1,000 mg Oral Q8H   dorzolamide-timolol  1 drop Right Eye BID   gabapentin  100 mg Oral TID   insulin aspart  0-15 Units Subcutaneous TID WC   levETIRAcetam  500 mg Oral BID   phytonadione  10 mg Oral Once   Continuous Infusions:  sodium chloride 125 mL/hr at 03/04/23 0909   piperacillin-tazobactam (ZOSYN)  IV 3.375 g (03/04/23 0911)    Nutritional status     Body mass index is 29.75 kg/m.  Data Reviewed:   CBC: Recent Labs  Lab 03/03/23 0922 03/04/23 0430  WBC 22.4* 18.1*  NEUTROABS 18.0*  --   HGB 11.8* 11.2*  HCT 35.9* 32.7*  MCV 99.4 95.6  PLT 336 317   Basic Metabolic Panel: Recent Labs  Lab 03/03/23 0922 03/04/23 0430  NA 129* 133*  K 5.4* 4.2  CL 99 108  CO2 15* 16*  GLUCOSE 134* 134*  BUN 94* 67*  CREATININE 2.06* 1.42*  CALCIUM 9.2 8.3*   GFR: Estimated Creatinine Clearance: 67.6 mL/min (A) (by C-G formula based on SCr of 1.42 mg/dL (H)). Liver Function Tests: Recent Labs  Lab 03/03/23 0922  AST 65*  ALT 38  ALKPHOS 172*  BILITOT 1.1  PROT 9.1*  ALBUMIN 2.1*   No results for input(s): "LIPASE", "AMYLASE" in the last 168 hours. No results for input(s): "AMMONIA" in the last 168 hours. Coagulation Profile: Recent Labs  Lab 03/03/23 1249 03/04/23 0430  INR 1.8* 2.1*   Cardiac Enzymes: Recent Labs  Lab 03/03/23 0922  CKTOTAL 140   BNP (last 3 results) No results for input(s): "PROBNP" in the last 8760 hours. HbA1C: No results for input(s): "HGBA1C" in the last 72 hours. CBG: Recent Labs  Lab 03/03/23 1753 03/04/23 0908  GLUCAP 163* 113*   Lipid Profile: No results for input(s): "CHOL", "HDL", "LDLCALC", "TRIG", "CHOLHDL", "LDLDIRECT" in the last 72 hours. Thyroid Function Tests: No results for input(s): "TSH", "T4TOTAL", "FREET4", "T3FREE", "THYROIDAB" in the last 72 hours. Anemia Panel: No results for input(s): "VITAMINB12", "FOLATE", "FERRITIN", "TIBC", "IRON", "RETICCTPCT" in the  last 72 hours. Sepsis Labs: Recent Labs  Lab 03/03/23 1038  LATICACIDVEN 1.2    Recent Results (from the past 240 hour(s))  Blood culture (routine x 2)     Status: None (Preliminary result)   Collection Time: 03/03/23  9:23 AM   Specimen: BLOOD LEFT FOREARM  Result Value Ref Range Status   Specimen Description BLOOD LEFT FOREARM  Final   Special Requests   Final    BOTTLES DRAWN AEROBIC AND ANAEROBIC Blood Culture results may not be optimal due to an inadequate volume of blood received in culture bottles   Culture   Final    NO GROWTH < 24 HOURS Performed at [facility], [address] [place], [place] [address]    Report Status PENDING  Incomplete  Blood culture (routine x 2)     Status: None (Preliminary result)   Collection Time: 03/03/23  9:28 AM   Specimen: BLOOD  Result Value Ref Range Status  Specimen Description BLOOD RIGHT ANTECUBITAL  Final   Special Requests   Final    BOTTLES DRAWN AEROBIC AND ANAEROBIC Blood Culture results may not be optimal due to an inadequate volume of blood received in culture bottles   Culture   Final    NO GROWTH < 24 HOURS Performed at [facility], [address] [place], [place] [address]    Report Status PENDING  Incomplete  MRSA Next Gen by PCR, Nasal     Status: None   Collection Time: 03/03/23  1:25 PM   Specimen: Nasal Mucosa; Nasal Swab  Result Value Ref Range Status   MRSA by PCR Next Gen NOT DETECTED NOT DETECTED Final    Comment: (NOTE) The GeneXpert MRSA Assay (FDA approved for NASAL specimens only), is one component of a comprehensive MRSA colonization surveillance program. It is not intended to diagnose MRSA infection nor to guide or monitor treatment for MRSA infections. Test performance is not FDA approved in patients less than 36 years old. Performed at [facility], [address], [place], [place] [address]   C Difficile Quick Screen w PCR reflex     Status: None   Collection Time: 03/03/23  11:04 PM   Specimen: STOOL  Result Value Ref Range Status   C Diff antigen NEGATIVE NEGATIVE Final   C Diff toxin NEGATIVE NEGATIVE Final   C Diff interpretation No C. difficile detected.  Final    Comment: Performed at [facility], [address], [place], [place] [address]         Radiology Studies: MR FOOT RIGHT W WO CONTRAST  Result Date: 03/04/2023 CLINICAL DATA:  Open wound on the plantar aspect of the foot. EXAM: MRI OF THE RIGHT FOREFOOT WITHOUT AND WITH CONTRAST TECHNIQUE: Multiplanar, multisequence MR imaging of the right foot was performed before and after the administration of intravenous contrast. CONTRAST:  10mL GADAVIST GADOBUTROL 1 MMOL/ML IV SOLN COMPARISON:  Radiographs 03/03/2023 FINDINGS: Examination is quite limited due to patient motion. Large open wound noted on the plantar aspect. Diffuse severe cellulitis and diffuse myofasciitis but no discrete abscess. No definite MR findings for septic arthritis or osteomyelitis. Moderate midfoot degenerative changes could be early neuropathic disease. IMPRESSION: 1. Very limited examination due to patient motion. 2. Large open wound on the plantar aspect but no discrete abscess. 3. Diffuse cellulitis and myofasciitis. 4. No definite MR findings for septic arthritis or osteomyelitis. 5. Moderate midfoot degenerative changes could be early neuropathic disease. Electronically Signed   By: Rudie Meyer M.D.   On: 03/04/2023 08:29   DG Foot 2 Views Right  Result Date: 03/03/2023 CLINICAL DATA:  Foot ulcers. EXAM: RIGHT FOOT - 2 VIEW COMPARISON:  Right foot radiographs 09/18/2022; MRI right forefoot 06/23/2022 FINDINGS: There is diffuse decreased bone mineralization. There is again a staple overlying the plantar midfoot soft tissues. In this region there is a more superficial soft tissue ulceration measuring approximately 7 mm in craniocaudal depth, similar to prior. There is worsened scattered subcutaneous air extending into the  superficial hindfoot soft tissues just posterior to this ulcer. There is a mild-to-moderate plantar calcaneal heel spur. There may be slightly worsened low bone mineralization seen at the tip of the calcaneal heel spur, and it is difficult to exclude acute osteomyelitis. Mild interphalangeal joint space narrowing diffusely. Mild second tarsometatarsal joint space narrowing. Mild dorsal midfoot degenerative osteophytes on lateral view. No acute fracture. IMPRESSION: There is a staple overlying the plantar midfoot  soft tissues with similar overlying anterior midfoot soft tissue ulcer. There is worsened scattered subcutaneous air extending into the superficial/plantar hindfoot soft tissues just posterior to this ulcer. Possible new bone loss at the tip of a plantar calcaneal heel spur, and it is difficult to exclude early acute osteomyelitis in this region. Electronically Signed   By: Neita Garnet M.D.   On: 03/03/2023 10:33   DG Foot 2 Views Left  Addendum Date: 03/03/2023   ADDENDUM REPORT: 03/03/2023 10:29 IMPRESSION: As described in the findings, there is a new acute fracture of the calcaneus extending anteriorly and superiorly from the region of posterior plantar calcaneal bone erosion that is concerning for osteomyelitis. Electronically Signed   By: Neita Garnet M.D.   On: 03/03/2023 10:29   Result Date: 03/03/2023 CLINICAL DATA:  Foot ulcers. EXAM: LEFT FOOT - 2 VIEW COMPARISON:  Left foot radiographs 06/20/2022 FINDINGS: There is mild soft tissue loss of the distal medial aspect of the great toe which is mildly improved from prior and likely reflects interval at least partial healing of the prior ulcer. There is minimal worsening of the previously seen mild-to-moderate erosion of the distal medial aspect of the distal phalanx of the great toe, now with more chronic appearing partially corticated margin. Minimal great toe metatarsophalangeal joint space narrowing and peripheral osteophytosis. New  high-grade ulceration of the plantar heel in a region measuring up to approximately 7 cm in AP dimension. Lucency from this ulceration is also seen at the medial ankle/hindfoot on frontal view. There is new lucency within the adjacent inferior aspect of the calcaneus in a region measuring approximately 1.4 cm in craniocaudal height. Two new linear longitudinal acute fracture line lucency is extending from this region superiorly and anteriorly to the posterior calcaneal facet at the posterior subtalar joint. Mild dorsal navicular-cuneiform degenerative osteophytes. IMPRESSION: 1. New high-grade ulceration of the plantar heel in a region measuring up to 7 cm in AP dimension. There is new lucency and cortical erosion within the adjacent inferior aspect of the calcaneus in a region measuring approximately 1.4 cm in craniocaudal height. This is suspicious for acute osteomyelitis. 2. There is minimal worsening of the previously seen mild-to-moderate erosion of the distal medial aspect of the great toe distal phalanx. Improvement in the overlying distal medial great toe soft tissue ulcer. This may represent the sequela of the prior active osteomyelitis. It is unclear whether there is any ongoing osteomyelitis in this region. Electronically Signed: By: Neita Garnet M.D. On: 03/03/2023 10:24           LOS: 1 day   Time spent= 35 mins    Jarica Plass Joline Maxcy, MD [facility]  If 7PM-7AM, please contact night-coverage  03/04/2023, 11:06 AM

## 2023-03-04 NOTE — Progress Notes (Signed)
Patient refusing exam and did not want to be moved to MRI exam table.  RN notified.

## 2023-03-05 ENCOUNTER — Ambulatory Visit: Payer: Medicaid Other | Admitting: Family

## 2023-03-05 ENCOUNTER — Encounter (HOSPITAL_COMMUNITY)
Admission: EM | Disposition: A | Discharge status: Home / Self Care | Payer: Self-pay | Source: Home / Self Care | Attending: Family Medicine

## 2023-03-05 DIAGNOSIS — M86171 Other acute osteomyelitis, right ankle and foot: Secondary | ICD-10-CM | POA: Diagnosis not present

## 2023-03-05 LAB — CBC
HCT: 36.8 % — ABNORMAL LOW (ref 39.0–52.0)
Hemoglobin: 11.9 g/dL — ABNORMAL LOW (ref 13.0–17.0)
MCH: 32.2 pg (ref 26.0–34.0)
MCHC: 32.3 g/dL (ref 30.0–36.0)
MCV: 99.5 fL (ref 80.0–100.0)
Platelets: 329 10*3/uL (ref 150–400)
RBC: 3.7 MIL/uL — ABNORMAL LOW (ref 4.22–5.81)
RDW: 14.6 % (ref 11.5–15.5)
WBC: 21.6 10*3/uL — ABNORMAL HIGH (ref 4.0–10.5)
nRBC: 0 % (ref 0.0–0.2)

## 2023-03-05 LAB — GLUCOSE, CAPILLARY
Glucose-Capillary: 104 mg/dL — ABNORMAL HIGH (ref 70–99)
Glucose-Capillary: 102 mg/dL — ABNORMAL HIGH (ref 70–99)
Glucose-Capillary: 99 mg/dL (ref 70–99)
Glucose-Capillary: 132 mg/dL — ABNORMAL HIGH (ref 70–99)

## 2023-03-05 LAB — PROTIME-INR
INR: 2.1 — ABNORMAL HIGH (ref 0.8–1.2)
Prothrombin Time: 23.3 seconds — ABNORMAL HIGH (ref 11.4–15.2)

## 2023-03-05 LAB — BASIC METABOLIC PANEL
Anion gap: 7 (ref 5–15)
BUN: 39 mg/dL — ABNORMAL HIGH (ref 6–20)
CO2: 17 mmol/L — ABNORMAL LOW (ref 22–32)
Calcium: 8.1 mg/dL — ABNORMAL LOW (ref 8.9–10.3)
Chloride: 110 mmol/L (ref 98–111)
Creatinine, Ser: 1.22 mg/dL (ref 0.61–1.24)
GFR, Estimated: >60 mL/min (ref >60)
Glucose, Bld: 108 mg/dL — ABNORMAL HIGH (ref 70–99)
Potassium: 4.3 mmol/L (ref 3.5–5.1)
Sodium: 134 mmol/L — ABNORMAL LOW (ref 135–145)

## 2023-03-05 LAB — MAGNESIUM: Magnesium: 1.6 mg/dL — ABNORMAL LOW (ref 1.7–2.4)

## 2023-03-05 LAB — CULTURE, BLOOD (ROUTINE X 2)

## 2023-03-05 SURGERY — AMPUTATION BELOW KNEE
Anesthesia: Choice | Site: Knee | Laterality: Left

## 2023-03-05 MED ORDER — MAGNESIUM SULFATE 2 GM/50ML IV SOLN
2.0000 g | Freq: Once | INTRAVENOUS | Status: AC
  Administered 2023-03-05: 2 g via INTRAVENOUS
  Filled 2023-03-05: qty 50

## 2023-03-05 NOTE — H&P (View-Only) (Signed)
Patient ID: Jesse [name], male   DOB: 05/08/1961, 62 y.o.   MRN: [id number] I spoke with Jesse [name] again this afternoon. He seems to be most afraid of not being able to care for himself postoperatively which seems reasonable. I told him I would be back by to see him tomorrow and discussed that we could proceed with surgery on Friday. I will place an order for the social worker to discuss placement options with patient.  

## 2023-03-05 NOTE — Progress Notes (Signed)
PROGRESS NOTE    Jesse PETTIJOHN  ZOX:[id number] DOB: [birth year] DOA: 03/03/2023 PCP: Grayce Sessions, NP   Brief Narrative:  a 62 y.o. male with medical history significant of HTN, HLD, IDDM, diabetic neuropathy, chronic bilateral feet diabetic ulcers, chronic venous stasis and lymphedema, presented with worsening of bilateral feet infection.  Patient follows outpatient wound care and orthopedic and has failed several rounds of p.o. antibiotics including I&D.  Now admitted for bilateral lower extremity osteomyelitis, orthopedic following.   Assessment & Plan:  Principal Problem:   Osteomyelitis Active Problems:   DM (diabetes mellitus)   Sepsis   Charcot's arthropathy   Lymphedema    Severe sepsis secondary to bilateral lower extremity feet osteomyelitis, diabetic foot ulcer - Sepsis physiology slowly improving.  Continue IV fluids, empiric IV Zosyn. - N.p.o., MRI foot ordered but patient refused--re-reordered for later today - Orthopedic following--? Amputation later this week--patient seems to have capacity to refuse intervention.  I have asked psychiatry to weigh in to confirm his understanding of thought process,and ensuaing downstream effects of amputation refusal [such as sepsis, death and dying] ABI in 2023/08/14showed noncompressible vessels of left lower extremity.  Difficult to obtain TBI secondary to the size--would probably abandon Angio-wouldn't change need for amputation of a gangrenous foot   AKI with non-anion gap metabolic acidosis -Admission creatinine 2.06, slight improved, labs am   Hyperkalemia -Received Lokelma now resolved   Hyponatremia -Improved   HTN -Home medications-Diovan on hold.  IV as needed   Uncontrolled insulin-dependent diabetes mellitus type 2 -Home Lantus on hold.  Currently on sliding scale Accu-Chek.  Will adjust as necessary.  Prior history of alcohol abuse Coagulopathy -INR elevated at 2.1.  Not on any home anticoagulation.   Vitamin K given--unlikely can cure this.  Very poor living conditions at home, TOC notified and aware   DVT prophylaxis: Continue subcu heparin Code Status: Full code Family Communication: None at bedside Consults called: Dr. Lajoyce Corners, vascular  Ongoing evaluation for significant osteomyelitis/infection of the lateral lower extremities      Diet Orders (From admission, onward)     Start     Ordered   03/06/23 0001  Diet NPO time specified  Diet effective midnight        03/05/23 1629   03/05/23 1456  Diet regular Room service appropriate? Yes; Fluid consistency: Thin  Diet effective now       Question Answer Comment  Room service appropriate? Yes   Fluid consistency: Thin      03/05/23 1455            Subjective: Foul smell emanating from lower extremities as soon as I enter the room Coherent awake alert   Examination:  Coherent black male seems appropriate cannot tell me who the first black president was cannot tell me other orienting questions but otherwise is able to tell me place time year Chest is clear no wheeze no rales or rhonchi ROM is intact Pictures from 4/24 of lower extremities      Objective: Vitals:   03/04/23 1654 03/05/23 0449 03/05/23 0756 03/05/23 1644  BP: 100/62 105/61 108/66 120/77  Pulse: 72 77 76 73  Resp: 18 15 18 18   Temp: 99.2 F (37.3 C) 98.4 F (36.9 C) 97.9 F (36.6 C) 98.3 F (36.8 C)  TempSrc: Oral Oral Oral Oral  SpO2: 96% 97% 97% 99%  Weight:      Height:        Intake/Output Summary (Last 24 hours)  at 03/05/2023 1858 Last data filed at 03/05/2023 1823 Gross per 24 hour  Intake 490 ml  Output 1700 ml  Net -1210 ml    Filed Weights   03/03/23 0905 03/03/23 2035  Weight: 108.9 kg 99.5 kg    Scheduled Meds:  acetaminophen  1,000 mg Oral Q8H   dorzolamide-timolol  1 drop Right Eye BID   gabapentin  100 mg Oral TID   insulin aspart  0-15 Units Subcutaneous TID WC   levETIRAcetam  500 mg Oral BID   Continuous  Infusions:  piperacillin-tazobactam (ZOSYN)  IV 3.375 g (03/05/23 1813)    Nutritional status     Body mass index is 29.75 kg/m.  Data Reviewed:   CBC: Recent Labs  Lab 03/03/23 0922 03/04/23 0430 03/05/23 0442  WBC 22.4* 18.1* 21.6*  NEUTROABS 18.0*  --   --   HGB 11.8* 11.2* 11.9*  HCT 35.9* 32.7* 36.8*  MCV 99.4 95.6 99.5  PLT 336 317 329    Basic Metabolic Panel: Recent Labs  Lab 03/03/23 0922 03/04/23 0430 03/05/23 0442  NA 129* 133* 134*  K 5.4* 4.2 4.3  CL 99 108 110  CO2 15* 16* 17*  GLUCOSE 134* 134* 108*  BUN 94* 67* 39*  CREATININE 2.06* 1.42* 1.22  CALCIUM 9.2 8.3* 8.1*  MG  --   --  1.6*    GFR: Estimated Creatinine Clearance: 78.7 mL/min (by C-G formula based on SCr of 1.22 mg/dL). Liver Function Tests: Recent Labs  Lab 03/03/23 0922  AST 65*  ALT 38  ALKPHOS 172*  BILITOT 1.1  PROT 9.1*  ALBUMIN 2.1*    No results for input(s): "LIPASE", "AMYLASE" in the last 168 hours. No results for input(s): "AMMONIA" in the last 168 hours. Coagulation Profile: Recent Labs  Lab 03/03/23 1249 03/04/23 0430 03/05/23 0442  INR 1.8* 2.1* 2.1*    Cardiac Enzymes: Recent Labs  Lab 03/03/23 0922  CKTOTAL 140    BNP (last 3 results) No results for input(s): "PROBNP" in the last 8760 hours. HbA1C: No results for input(s): "HGBA1C" in the last 72 hours. CBG: Recent Labs  Lab 03/04/23 1656 03/04/23 2237 03/05/23 0933 03/05/23 1229 03/05/23 1647  GLUCAP 101* 122* 104* 102* 99    Lipid Profile: No results for input(s): "CHOL", "HDL", "LDLCALC", "TRIG", "CHOLHDL", "LDLDIRECT" in the last 72 hours. Thyroid Function Tests: No results for input(s): "TSH", "T4TOTAL", "FREET4", "T3FREE", "THYROIDAB" in the last 72 hours. Anemia Panel: No results for input(s): "VITAMINB12", "FOLATE", "FERRITIN", "TIBC", "IRON", "RETICCTPCT" in the last 72 hours. Sepsis Labs: Recent Labs  Lab 03/03/23 1038  LATICACIDVEN 1.2        Radiology  Studies: MR FOOT RIGHT W WO CONTRAST  Result Date: 03/04/2023 CLINICAL DATA:  Open wound on the plantar aspect of the foot. EXAM: MRI OF THE RIGHT FOREFOOT WITHOUT AND WITH CONTRAST TECHNIQUE: Multiplanar, multisequence MR imaging of the right foot was performed before and after the administration of intravenous contrast. CONTRAST:  10mL GADAVIST GADOBUTROL 1 MMOL/ML IV SOLN COMPARISON:  Radiographs 03/03/2023 FINDINGS: Examination is quite limited due to patient motion. Large open wound noted on the plantar aspect. Diffuse severe cellulitis and diffuse myofasciitis but no discrete abscess. No definite MR findings for septic arthritis or osteomyelitis. Moderate midfoot degenerative changes could be early neuropathic disease. IMPRESSION: 1. Very limited examination due to patient motion. 2. Large open wound on the plantar aspect but no discrete abscess. 3. Diffuse cellulitis and myofasciitis. 4. No definite MR findings for septic  arthritis or osteomyelitis. 5. Moderate midfoot degenerative changes could be early neuropathic disease. Electronically Signed   By: Rudie Meyer M.D.   On: 03/04/2023 08:29      LOS: 2 days   Time spent= 55 mins including discussion time with the patient's Sister [name] on the phone and consulting with Dr. Marijo Sanes, MD [facility]  If 7PM-7AM, please contact night-coverage  03/05/2023, 6:58 PM

## 2023-03-05 NOTE — Plan of Care (Addendum)
RN was called by MRI to see if pt willing to try to gown down again for MRI. RN went to speak with pt and educate pt on importance of getting scans and surgery if pt willing. RN spoke with pt extensively and explained to pt the severity of pt's infection and that complications that could arise but pt still very unsure about going down for MRI and surgery. Pt stating pain was a factor in pt not wanting to go down for MRI but RN educated pt RN could give IV dilaudid before MRI to help control pt pain. Pt continued to decline MRI. When RN spoke with Pt about whether pt had thought about having his surgery/amputation today pt still unsure and unwilling to go. RN made Dr.Duda and Dr.Samtani aware pt not willing to go for MRI and pt still not agreeable for surgery, RN unsure of pt's ability to comprehend the situation . MD's aware, Dr.Duda to come by later to see pt.

## 2023-03-05 NOTE — Plan of Care (Signed)
Pt gave RN permission to call pt sister and update his sister on pt status. RN called only sister listed Ms.[name] Derrell Lolling. RN updated ebony on pt's situation. Pt sister stated "I did not even know he was in the hospital." RN updated pt sister on pt status and all questions answered. Pt sister understanding and emotional, pt sister stated she does not want to lose her brother and wants him to get better. per pt sister she would be talking with her other siblings and calling/coming to see pt when able.

## 2023-03-05 NOTE — Progress Notes (Addendum)
This RN attempted to educate the patients on the benefits of consenting for surgery so that the wounds can be explored and possible BKA as this would mitigate the situation and save the gangrene progression upwards. He was given an opportunity to express his fears and his reason for declining MRI and the procedures. The patient became quite upset and requested me to change the topic because he does not want to speak about that topic again. This RN reassured him and aborted the topic.  1147 hrs Patient seems depressed. For a minute he did not want to expose his hand for scanning during the midnight antibiotic administration. I promised him I will only expose the wrist only for scanning. Comparison between yesterday and today, he is low morally.

## 2023-03-05 NOTE — Progress Notes (Signed)
   03/05/23 1558  Spiritual Encounters  Type of Visit Declined chaplain visit  OnCall Visit No  Advance Directives (For Healthcare)  Does Patient Have a Medical Advance Directive? No  Would patient like information on creating a medical advance directive? No - Patient declined   Chaplain's visit was declined. Patient stated he was not interested in AD.

## 2023-03-05 NOTE — TOC Initial Note (Signed)
Transition of Care [facility]) - Initial/Assessment Note    Patient Details  Name: Jesse [name] MRN: [id number] Date of Birth: 1961/05/29  Transition of Care [facility]) CM/SW Contact:    Janae Bridgeman, RN Phone Number: 03/05/2023, 3:13 PM  Clinical Narrative:                 CM met with the patient at the bedside to discuss TOC needs.  The patient was admitted to the hospital with sepsis, fever and infestation/infection of Left lower extremity.  The patient's legs were covered in maggots when he presented to the hospital.  The patient was able to answer all of my questions in great detail concerning his history, circumstance and home environment.  He states that he lives alone in the same apartment for the past 20 years and does not receive disability check at this time.  He states that his family and church help him with his rent, bills and cell phone bill.  The patient receives 290.00 per month in food stamps.  The patient utilizes Medicaid transportation and drives a Moped to appointments and to obtain groceries at the store.  He states that his sister, Jesse [name] lives in [place] near his residence but she does not drive.  The patient's other sister, Jesse [name] is assisting him with disability process at this time.  DME at the home include WC and RW.  The patient does not have Home health services but was in agreement for SNF placement for wound care of post-surgery if he wishes to proceed for surgery this week.  The patient states that he drinks an occasional beer but declines use of drugs nor smoking.  CM will continue to follow the patient for TOC needs.  Expected Discharge Plan: Skilled Nursing Facility Barriers to Discharge: Continued Medical Work up   Patient Goals and CMS Choice Patient states their goals for this hospitalization and ongoing recovery are:: to get better CMS Medicare.gov Compare Post Acute Care list provided to:: Patient Choice offered to / list presented to :  Patient      Expected Discharge Plan and Services   Discharge Planning Services: CM Consult Post Acute Care Choice: Resumption of Svcs/PTA Provider Living arrangements for the past 2 months: Apartment                                      Prior Living Arrangements/Services Living arrangements for the past 2 months: Apartment Lives with:: Self Patient language and need for interpreter reviewed:: Yes Do you feel safe going back to the place where you live?: Yes      Need for Family Participation in Patient Care: Yes (Comment) Care giver support system in place?: Yes (comment) Current home services: DME (WC, RW and BSC at the home) Criminal Activity/Legal Involvement Pertinent to Current Situation/Hospitalization: No - Comment as needed  Activities of Daily Living Home Assistive Devices/Equipment: Wheelchair, Environmental consultant (specify type) ADL Screening (condition at time of admission) Patient's cognitive ability adequate to safely complete daily activities?: Yes Is the patient deaf or have difficulty hearing?: No Does the patient have difficulty seeing, even when wearing glasses/contacts?: No Does the patient have difficulty concentrating, remembering, or making decisions?: No Patient able to express need for assistance with ADLs?: Yes Does the patient have difficulty dressing or bathing?: No Independently performs ADLs?: No Communication: Independent Dressing (OT): Independent Grooming: Independent Feeding: Independent Bathing: Independent Toileting: Independent with device (  comment) In/Out Bed: Independent with device (comment) Walks in Home: Independent with device (comment) Does the patient have difficulty walking or climbing stairs?: Yes Weakness of Legs: None Weakness of Arms/Hands: None  Permission Sought/Granted Permission sought to share information with : Case Manager       Permission granted to share info w AGENCY: Email sent to financial counselor to assist  with disability        Emotional Assessment Appearance:: Appears stated age Attitude/Demeanor/Rapport: Gracious Affect (typically observed): Accepting Orientation: : Oriented to Self, Oriented to Place, Oriented to  Time, Oriented to Situation Alcohol / Substance Use: Alcohol Use (patient "occasionally drinks beer", denies daily consumption) Psych Involvement: No (comment)  Admission diagnosis:  Osteomyelitis [M86.9] Wound infection [T14.8XXA, L08.9] Patient Active Problem List   Diagnosis Date Noted   Lymphedema 03/04/2023   Eye injury 11/21/2022   Hyperkalemia 07/10/2022   Chronic painful diabetic neuropathy 07/10/2022   Metabolic acidosis, normal anion gap (NAG) 07/10/2022   Hypomagnesemia 07/10/2022   Charcot's arthropathy 06/28/2022   Leukocytosis    Chronic 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    Severe protein-calorie malnutrition    Diabetic foot infection 06/22/2022   Sepsis 06/22/2022   Cellulitis in diabetic foot 04/17/2021   Osteomyelitis 04/14/2021   HTN (hypertension) 04/08/2021   HLD (hyperlipidemia) 04/08/2021   Alcohol abuse    DM (diabetes mellitus) 01/27/2014   AKI (acute kidney injury) 01/27/2014   PCP:  Jesse Sessions, NP Pharmacy:   [facility] [phone] - [place], [place] - [address] AT [facility] [address] [place] [address] Phone: [phone] Fax: [phone]     Social Determinants of Health (SDOH) Social History: SDOH Screenings   Food Insecurity: Food Insecurity Present (03/03/2023)  Housing: Low Risk  (03/03/2023)  Recent Concern: Housing - High Risk (01/25/2023)  Transportation Needs: No Transportation Needs (03/03/2023)  Utilities: Not At Risk (03/03/2023)  Depression (PHQ2-9): Medium Risk (10/28/2022)  Tobacco Use: Low Risk  (03/03/2023)   SDOH Interventions:     Readmission Risk Interventions    03/05/2023    3:13 PM   Readmission Risk Prevention Plan  Transportation Screening Complete  PCP or Specialist Appt within 5-7 Days Complete  Home Care Screening Complete  Medication Review (RN CM) Complete

## 2023-03-05 NOTE — Progress Notes (Signed)
      Long conversation with patient--he declines amputation-williong to consider "cleaning out area"--I think he might have some capacity to make decisions, but is unable to orient some. I will ask Psychiatry to weigh in after patient stated to me "My sister doesn't have anything to do with my decision"    Long conversation with Sister--she understands gravity of situation.   For now will let patient drink fluids and re-order MRI for later this pm  Appreciate Dr. Lajoyce Corners Collaboration in this case  Full note to follow.  Pleas Koch, MD [facility] 2:46 PM

## 2023-03-05 NOTE — Progress Notes (Signed)
Patient ID: Jesse [name], male   DOB: Nov 17, 1960, 62 y.o.   MRN: [id number] I spoke with Haruo again this afternoon. He seems to be most afraid of not being able to care for himself postoperatively which seems reasonable. I told him I would be back by to see him tomorrow and discussed that we could proceed with surgery on Friday. I will place an order for the social worker to discuss placement options with patient.

## 2023-03-05 NOTE — Progress Notes (Addendum)
[facility]   Subjective  - Some what confused and wants to get home.   Objective 105/61 77 98.4 F (36.9 C) (Oral) 15 97%  Intake/Output Summary (Last 24 hours) at 03/05/2023 0701 Last data filed at 03/04/2023 2046 Gross per 24 hour  Intake 1579.11 ml  Output 2050 ml  Net -470.89 ml         Assessment/Planning: This is a 62 y.o. male with bilateral lower extremity wounds who presented with sepsis   Plan for angiogram with possible intervention to optimized blood flow for limb salvage  He had maggots on the left heel and has been evaluated by Dr. Lajoyce Corners who recommends left below-knee amputation   Leukocytosis elevated 21.6 Cr elevated 1.42 INR 2.1 elevated with history of liver insufficiency  NPO pending Dr. Audrie Lia discussion with patient.  Vascular will hold off on angiogram until INR has decreased to lees than 2.0  Jesse [name] 03/05/2023 7:01 AM --  Laboratory Lab Results: Recent Labs    03/04/23 0430 03/05/23 0442  WBC 18.1* 21.6*  HGB 11.2* 11.9*  HCT 32.7* 36.8*  PLT 317 329   BMET Recent Labs    03/04/23 0430 03/05/23 0442  NA 133* 134*  K 4.2 4.3  CL 108 110  CO2 16* 17*  GLUCOSE 134* 108*  BUN 67* 39*  CREATININE 1.42* 1.22  CALCIUM 8.3* 8.1*    COAG Lab Results  Component Value Date   INR 2.1 (H) 03/05/2023   INR 2.1 (H) 03/04/2023   INR 1.8 (H) 03/03/2023   No results found for: "PTT"

## 2023-03-06 ENCOUNTER — Encounter (HOSPITAL_COMMUNITY)
Admission: EM | Disposition: A | Discharge status: Home / Self Care | Payer: Self-pay | Source: Home / Self Care | Attending: Family Medicine

## 2023-03-06 DIAGNOSIS — M86171 Other acute osteomyelitis, right ankle and foot: Secondary | ICD-10-CM | POA: Diagnosis not present

## 2023-03-06 HISTORY — PX: PERIPHERAL VASCULAR INTERVENTION: CATH118257

## 2023-03-06 HISTORY — PX: ABDOMINAL AORTOGRAM W/LOWER EXTREMITY: CATH118223

## 2023-03-06 LAB — BASIC METABOLIC PANEL
Anion gap: 11 (ref 5–15)
BUN: 29 mg/dL — ABNORMAL HIGH (ref 6–20)
CO2: 17 mmol/L — ABNORMAL LOW (ref 22–32)
Calcium: 8.4 mg/dL — ABNORMAL LOW (ref 8.9–10.3)
Chloride: 106 mmol/L (ref 98–111)
Creatinine, Ser: 1.1 mg/dL (ref 0.61–1.24)
GFR, Estimated: >60 mL/min (ref >60)
Glucose, Bld: 116 mg/dL — ABNORMAL HIGH (ref 70–99)
Potassium: 4.5 mmol/L (ref 3.5–5.1)
Sodium: 134 mmol/L — ABNORMAL LOW (ref 135–145)

## 2023-03-06 LAB — CBC WITH DIFFERENTIAL/PLATELET
Abs Immature Granulocytes: 0.37 10*3/uL — ABNORMAL HIGH (ref 0.00–0.07)
Basophils Absolute: 0.1 10*3/uL (ref 0.0–0.1)
Basophils Relative: 0 %
Eosinophils Absolute: 0.2 10*3/uL (ref 0.0–0.5)
Eosinophils Relative: 1 %
HCT: 34.5 % — ABNORMAL LOW (ref 39.0–52.0)
Hemoglobin: 11.8 g/dL — ABNORMAL LOW (ref 13.0–17.0)
Immature Granulocytes: 2 %
Lymphocytes Relative: 7 %
Lymphs Abs: 1.6 10*3/uL (ref 0.7–4.0)
MCH: 33.1 pg (ref 26.0–34.0)
MCHC: 34.2 g/dL (ref 30.0–36.0)
MCV: 96.6 fL (ref 80.0–100.0)
Monocytes Absolute: 1.9 10*3/uL — ABNORMAL HIGH (ref 0.1–1.0)
Monocytes Relative: 8 %
Neutro Abs: 20.7 10*3/uL — ABNORMAL HIGH (ref 1.7–7.7)
Neutrophils Relative %: 82 %
Platelets: 353 10*3/uL (ref 150–400)
RBC: 3.57 MIL/uL — ABNORMAL LOW (ref 4.22–5.81)
RDW: 14.6 % (ref 11.5–15.5)
WBC: 24.8 10*3/uL — ABNORMAL HIGH (ref 4.0–10.5)
nRBC: 0 % (ref 0.0–0.2)

## 2023-03-06 LAB — GLUCOSE, CAPILLARY
Glucose-Capillary: 116 mg/dL — ABNORMAL HIGH (ref 70–99)
Glucose-Capillary: 125 mg/dL — ABNORMAL HIGH (ref 70–99)
Glucose-Capillary: 126 mg/dL — ABNORMAL HIGH (ref 70–99)
Glucose-Capillary: 94 mg/dL (ref 70–99)

## 2023-03-06 LAB — PROTIME-INR
INR: 1.7 — ABNORMAL HIGH (ref 0.8–1.2)
Prothrombin Time: 19.7 seconds — ABNORMAL HIGH (ref 11.4–15.2)

## 2023-03-06 SURGERY — ABDOMINAL AORTOGRAM W/LOWER EXTREMITY
Anesthesia: LOCAL

## 2023-03-06 MED ORDER — ACETAMINOPHEN 325 MG PO TABS: 650.0000 mg | ORAL_TABLET | ORAL | Status: DC | PRN

## 2023-03-06 MED ORDER — SODIUM CHLORIDE 0.9 % IV SOLN: INTRAVENOUS | Status: AC

## 2023-03-06 MED ORDER — HEPARIN (PORCINE) IN NACL 1000-0.9 UT/500ML-% IV SOLN
INTRAVENOUS | Status: DC | PRN
  Administered 2023-03-06 (×2): 500 mL

## 2023-03-06 MED ORDER — HEPARIN SODIUM (PORCINE) 1000 UNIT/ML IJ SOLN
INTRAMUSCULAR | Status: AC
  Filled 2023-03-06: qty 10

## 2023-03-06 MED ORDER — SODIUM CHLORIDE 0.9% FLUSH
3.0000 mL | Freq: Two times a day (BID) | INTRAVENOUS | Status: DC
  Administered 2023-03-07 – 2023-03-09 (×4): 3 mL via INTRAVENOUS

## 2023-03-06 MED ORDER — LIDOCAINE HCL (PF) 1 % IJ SOLN
INTRAMUSCULAR | Status: AC
  Filled 2023-03-06: qty 30

## 2023-03-06 MED ORDER — ASPIRIN 81 MG PO TBEC
81.0000 mg | DELAYED_RELEASE_TABLET | Freq: Every day | ORAL | Status: DC
  Administered 2023-03-06 – 2023-03-10 (×5): 81 mg via ORAL
  Filled 2023-03-06 (×5): qty 1

## 2023-03-06 MED ORDER — HYDRALAZINE HCL 20 MG/ML IJ SOLN: 5.0000 mg | INTRAMUSCULAR | Status: DC | PRN

## 2023-03-06 MED ORDER — SODIUM CHLORIDE 0.9 % IV SOLN: INTRAVENOUS | Status: DC

## 2023-03-06 MED ORDER — IODIXANOL 320 MG/ML IV SOLN
INTRAVENOUS | Status: DC | PRN
  Administered 2023-03-06: 150 mL

## 2023-03-06 MED ORDER — ATORVASTATIN CALCIUM 40 MG PO TABS
40.0000 mg | ORAL_TABLET | Freq: Every day | ORAL | Status: DC
  Administered 2023-03-06 – 2023-03-09 (×4): 40 mg via ORAL
  Filled 2023-03-06 (×4): qty 1

## 2023-03-06 MED ORDER — LABETALOL HCL 5 MG/ML IV SOLN: 10.0000 mg | INTRAVENOUS | Status: DC | PRN

## 2023-03-06 MED ORDER — LIDOCAINE HCL (PF) 1 % IJ SOLN
INTRAMUSCULAR | Status: DC | PRN
  Administered 2023-03-06: 20 mL

## 2023-03-06 MED ORDER — SODIUM CHLORIDE 0.9% FLUSH: 3.0000 mL | INTRAVENOUS | Status: DC | PRN

## 2023-03-06 MED ORDER — SODIUM CHLORIDE 0.9 % IV SOLN: 250.0000 mL | INTRAVENOUS | Status: DC | PRN

## 2023-03-06 MED ORDER — HEPARIN SODIUM (PORCINE) 1000 UNIT/ML IJ SOLN
INTRAMUSCULAR | Status: DC | PRN
  Administered 2023-03-06: 10000 [IU] via INTRAVENOUS

## 2023-03-06 MED ORDER — ONDANSETRON HCL 4 MG/2ML IJ SOLN: 4.0000 mg | Freq: Four times a day (QID) | INTRAMUSCULAR | Status: DC | PRN

## 2023-03-06 SURGICAL SUPPLY — 22 items
BALLN STERLING OTW 4X80X135 (BALLOONS) ×2
BALLN STERLING SL OTW 3X80X150 (BALLOONS) ×2
BALLOON STERLING OTW 4X80X135 (BALLOONS) IMPLANT
BALLOON STRLNG SL OTW 3X80X150 (BALLOONS) IMPLANT
CATH OMNI FLUSH 5F 65CM (CATHETERS) IMPLANT
CATH QUICKCROSS .018X135CM (MICROCATHETER) IMPLANT
CATH QUICKCROSS SUPP .035X90CM (MICROCATHETER) IMPLANT
DEVICE CLOSURE MYNXGRIP 5F (Vascular Products) IMPLANT
KIT ENCORE 26 ADVANTAGE (KITS) IMPLANT
KIT MICROPUNCTURE NIT STIFF (SHEATH) IMPLANT
KIT PV (KITS) ×2 IMPLANT
SHEATH CATAPULT 5FR 60 (SHEATH) IMPLANT
SHEATH PINNACLE 5F 10CM (SHEATH) IMPLANT
SHEATH PROBE COVER 6X72 (BAG) IMPLANT
STOPCOCK MORSE 400PSI 3WAY (MISCELLANEOUS) IMPLANT
SYR MEDRAD MARK 7 150ML (SYRINGE) ×2 IMPLANT
TRANSDUCER W/STOPCOCK (MISCELLANEOUS) ×2 IMPLANT
TRAY PV CATH (CUSTOM PROCEDURE TRAY) ×2 IMPLANT
TUBING CIL FLEX 10 FLL-RA (TUBING) IMPLANT
WIRE EMERALD 3MM-J .035X260CM (WIRE) IMPLANT
WIRE G V18X300CM (WIRE) IMPLANT
WIRE STARTER BENTSON 035X150 (WIRE) IMPLANT

## 2023-03-06 NOTE — Progress Notes (Addendum)
         Plan for angiogram today with LE runoff and possible intervention.   Dr. Lajoyce Corners plans for possible left LE BKA  Assessment/Planning: This is a 62 y.o. male with bilateral lower extremity wounds who presented with sepsis   NPO Mosetta Pigeon  PA-C  I have seen and evaluated the patient. I agree with the PA note as documented above.  Dr. Lajoyce Corners has recommended a left below-knee amputation.  Plan will be left femoral approach with focus and intervention on the right leg to see if this is salvageable.  Cephus Shelling, MD [facility] [place] Office: [phone]

## 2023-03-06 NOTE — Progress Notes (Signed)
Pharmacy Antibiotic Note  Jesse [name] is a 62 y.o. male admitted on 03/03/2023 presenting with worsening foot infection.  Pharmacy has been consulted for zosyn dosing.  Plan for angiogram today with possible amputation on Friday.  Wbc continue to rise. Culture remains neg.   Scr 1.1  Plan: Zosyn 3.375g IV every 8 hours (extended 4h infusion) Monitor renal function, Ortho recs and any intervention plans   Height: 6' (182.9 cm) Weight: 99.5 kg (219 lb 5.7 oz) IBW/kg (Calculated) : 77.6  Temp (24hrs), Avg:98.3 F (36.8 C), Min:97.7 F (36.5 C), Max:99.1 F (37.3 C)  Recent Labs  Lab 03/03/23 0922 03/03/23 1038 03/04/23 0430 03/05/23 0442 03/06/23 0421  WBC 22.4*  --  18.1* 21.6* 24.8*  CREATININE 2.06*  --  1.42* 1.22 1.10  LATICACIDVEN  --  1.2  --   --   --      Estimated Creatinine Clearance: 87.3 mL/min (by C-G formula based on SCr of 1.1 mg/dL).    No Known Allergies  [name], PharmD, BCIDP, AAHIVP, CPP Infectious Disease Pharmacist 03/06/2023 1:32 PM

## 2023-03-06 NOTE — TOC Progression Note (Signed)
Transition of Care [facility]) - Progression Note    Patient Details  Name: Jesse [name] MRN: [id number] Date of Birth: [birth year]  Transition of Care [facility]) CM/SW Contact  Janae Bridgeman, RN Phone Number: 03/06/2023, 9:28 AM  Clinical Narrative:    CM spoke with Christia Reading, Financial Counselor and she plans to reach out to the patient to follow up regarding patient's progress with disability application.  The patient stated yesterday that his sister in [place], [place] was assisting him with this process.  Financial Counseling may send [facility] referral to assist.  The patient was seen by Dr. Lajoyce Corners last night and patient may have pending surgery scheduled for Friday.   Expected Discharge Plan: Skilled Nursing Facility Barriers to Discharge: Continued Medical Work up  Expected Discharge Plan and Services   Discharge Planning Services: CM Consult Post Acute Care Choice: Resumption of Svcs/PTA Provider Living arrangements for the past 2 months: Apartment                                       Social Determinants of Health (SDOH) Interventions SDOH Screenings   Food Insecurity: Food Insecurity Present (03/03/2023)  Housing: Low Risk  (03/03/2023)  Recent Concern: Housing - High Risk (01/25/2023)  Transportation Needs: No Transportation Needs (03/03/2023)  Utilities: Not At Risk (03/03/2023)  Depression (PHQ2-9): Medium Risk (10/28/2022)  Tobacco Use: Low Risk  (03/03/2023)    Readmission Risk Interventions    03/05/2023    3:13 PM  Readmission Risk Prevention Plan  Transportation Screening Complete  PCP or Specialist Appt within 5-7 Days Complete  Home Care Screening Complete  Medication Review (RN CM) Complete

## 2023-03-06 NOTE — Op Note (Signed)
Patient name: Jesse [name] MRN: [id number] DOB: 1961-06-04 Sex: male  03/06/2023 Pre-operative Diagnosis: Bilateral lower extremity ulceration Post-operative diagnosis:  Same Surgeon:  Cephus Shelling, MD Procedure Performed: 1.  Ultrasound-guided access left common femoral artery 2.  Aortogram with catheter selection of aorta 3.  Bilateral lower extremity arteriogram 4.  Right lower extremity arteriogram with selection of the SFA and anterior tibial artery 5.  Right anterior tibial artery angioplasty (3 mm x 80 mm Sterling and 4 mm x 80 mm Sterling) 6.  Mynx closure of the left common femoral artery  Indications: 62 year old male presented with bilateral lower extremity ulceration.  Has been evaluated by orthopedic surgery who has recommended a left below-knee amputation.  He presents today for attempted limb salvage of the right foot after risk benefits discussed.  Findings:   Aortogram showed no flow-limiting stenosis in the aortoiliac segment.  The right renal artery was visualized and was widely patent.  Right lower extremity arteriogram showed a widely patent common femoral, profunda, SFA, above and below-knee popliteal artery.  Patient had three-vessel runoff distally.  The anterior tibial had a focal 80% stenosis in the mid vessel.  The peroneal artery was diseased distally just above the ankle.  Dominant runoff into the foot was through the posterior tibial that was widely patent.  The anterior tibial stenosis was then treated with a 3 mm Sterling with 50% residual disease and I upsized to a 4 mm Sterling with less than 30% residual stenosis.  Preserved three-vessel runoff.  Left lower extremity arteriogram showed a widely patent common femoral, profunda, SFA, popliteal artery and what looked like three-vessel runoff although the contrast timing was poor.  Anterior tibial has some disease in the proximal vessel.   Procedure:  The patient was identified in the holding  area and taken to room 8.  The patient was then placed supine on the table and prepped and draped in the usual sterile fashion.  A time out was called.  Ultrasound was used to evaluate the left common femoral artery.  It was patent .  A digital ultrasound image was acquired.  A micropuncture needle was used to access the left common femoral artery under ultrasound guidance.  An 018 wire was advanced without resistance and a micropuncture sheath was placed.  The 018 wire was removed and a benson wire was placed.  The micropuncture sheath was exchanged for a 5 french sheath.  An omniflush catheter was advanced over the wire to the level of L-1.  An abdominal angiogram was obtained.  The catheter was pulled down in the abdominal aorta and we shot bilateral lower extremity runoff.  After evaluating images elected for tibial intervention.  Then crossed the aortic bifurcation with an Omni Flush catheter.  I placed a wire down the right SFA and exchanged for a long 5 French Catapault sheath in the left groin over the aortic bifurcation into the right SFA.  We got some dedicated right lower extremity hand-injection's to identify the lesion.  I then attempted pass a V18 down to the tibial arteries but this kept going out branches so used a J-wire to get into the below-knee popliteal artery and then exchanged back for the V18 over a quick cross catheter.  I then used a V18 to cannulate the anterior tibial through hand-injection and I got down the anterior tibial and crossed the stenosis.  The anterior tibial was treated with a 3 mm x 80 mm Sterling to nominal pressure for  2 minutes.  There was still 50% residual disease so I upsized to a 4 mm x 80 mm Sterling to nominal pressure for 2 minutes.  Excellent results.  Preserved three-vessel runoff.  Wires and catheters were removed.  Mynx was deployed in the left groin.  Plan: Patient is optimized in the right lower extremity from vascular surgery standpoint.  Three-vessel  runoff.  Will load on aspirin today.  Hold Plavix pending operative intervention from orthopedic surgery with Dr. Lajoyce Corners.  Cephus Shelling, MD [facility] [place] Office: [phone]

## 2023-03-06 NOTE — Consult Note (Signed)
[facility] Psychiatry Face-to-Face Psychiatric Evaluation   Service Date: March 06, 2023 LOS:  LOS: 3 days    Assessment  The patient is a 62 year old male with no documented or subjectively reported past psychiatric history.  He has a medical history significant for type 2 diabetes as well as lymphedema.  He was medically admitted on 4/22 for left foot wound.  The primary medical team and the orthopedic consultant recommended a transtibial amputation due to gangrene.  The patient has refused.  Psychiatry consultation was requested for capacity assessment.  Based on our assessment on 4/25, the patient has the capacity to refuse transtibial amputation as well as associated imaging and procedures.  The patient demonstrates no deficits in the 4 domains of cognitive capacity.  He has expressed a clear and consistent choice, demonstrating intact communication.  He is able to state his present medical conditions, the recommended intervention by the treatment team, and the likely consequences should he refuse that treatment.  He is able to explicitly state that he will die in the near future if he does not receive the amputation.  The patient produces logical rationales for why he does not want to receive the amputation.  Mental status examination is notable for the patient being fully alert and oriented, with intact memory and concentration.  The integrity of the patient's capacity may change with time, especially if the patient were to become delirious.  This capacity assessment does not serve as a replacement for subsequent determination by medical care providers.    Diagnoses:  Active Hospital problems: Principal Problem:   Osteomyelitis Active Problems:   DM (diabetes mellitus)   Sepsis   Charcot's arthropathy   Lymphedema     Plan  ## Safety and Observation Level:  - Based on my clinical evaluation, I estimate the patient to be at low risk of self harm in the current setting - At  this time, we recommend a routine level of observation. This decision is based on my review of the chart including patient's history and current presentation, interview of the patient, mental status examination, and consideration of suicide risk including evaluating suicidal ideation, plan, intent, suicidal or self-harm behaviors, risk factors, and protective factors. This judgment is based on our ability to directly address suicide risk, implement suicide prevention strategies and develop a safety plan while the patient is in the clinical setting. Please contact our team if there is a concern that risk level has changed.   ## Medications:  -- NA  ## Medical Decision Making Capacity:  As above  ## Further Work-up:  Per primary  ## Disposition:  TBD  ## Behavioral / Environmental:  --Routine obs  ##Legal Status VOL  Thank you for this consult request. Recommendations have been communicated to the primary team.  We will sign off at this time.   Carlyn Reichert, MD   NEW history  Relevant Aspects of Hospital Course:  Admitted on 03/03/2023 for foot wound.  Patient Report:  Pertinent history is gathered from the patient as follows. For ease of reading, objective statements that are made in the paragraphs that follow actually reflect subjective report of the patient.  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  He reports that his sister is close to him but is not able to support him financially.  He reports that he has  very limited financial means.  He denies experiencing significant depression and denies experiencing any suicidal thoughts over the past several weeks or months.  ROS:  As above  Collateral information:  none  Psychiatric History:  Information collected from patient, EMR  Family psych history: none   Social History:  As above   Family  History:  The patient's family history is not on file.  Medical History: Past Medical History:  Diagnosis Date   AKI (acute kidney injury) 01/27/2014   Alcohol abuse    Anasarca    Bilateral leg edema 02/07/2021   Cellulitis    Cellulitis and abscess 01/26/2014   Cellulitis of right foot 04/17/2021   Dental abscess 01/26/2014   Diabetes mellitus type 2 in obese    Diabetic foot infection 04/08/2021   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 2 diabetes mellitus, limited to breakdown of skin    Facial cellulitis 01/26/2014   HTN (hypertension)    Hypokalemia 01/28/2014   Infected dental carries 01/26/2014   Leukocytosis 01/27/2014   Osteomyelitis 04/14/2021   Right foot infection     Surgical History: Past Surgical History:  Procedure Laterality Date   I & D EXTREMITY Right 04/18/2021   Procedure: IRRIGATION AND DEBRIDEMENT OF FOOT;  Surgeon: Nadara Mustard, MD;  Location: [facility] OR;  Service: Orthopedics;  Laterality: Right;   I & D EXTREMITY Right 04/20/2021   Procedure: EXCISIONAL DEBRIDEMENT RIGHT FOOT, APPLICATION OF SKIN GRAFT;  Surgeon: Nadara Mustard, MD;  Location: [facility] OR;  Service: Orthopedics;  Laterality: Right;    Medications:   Current Facility-Administered Medications:    acetaminophen (TYLENOL) tablet 650 mg, 650 mg, Oral, Q6H PRN **OR** acetaminophen (TYLENOL) suppository 650 mg, 650 mg, Rectal, Q6H PRN, Emeline General, MD   acetaminophen (TYLENOL) tablet 1,000 mg, 1,000 mg, Oral, Q8H, Mikey College T, MD, 1,000 mg at 03/05/23 2129   bisacodyl (DULCOLAX) EC tablet 5 mg, 5 mg, Oral, Daily PRN, Mikey College T, MD   dorzolamide-timolol (COSOPT) 2-0.5 % ophthalmic solution 1 drop, 1 drop, Right Eye, BID, Mikey College T, MD, 1 drop at 03/05/23 0934   gabapentin (NEURONTIN) capsule 100 mg, 100 mg, Oral, TID, Mikey College T, MD, 100 mg at 03/05/23 2129   guaiFENesin (ROBITUSSIN) 100 MG/5ML liquid 5 mL, 5 mL, Oral, Q4H PRN, Amin, Ankit Chirag, MD   hydrALAZINE (APRESOLINE)  injection 5 mg, 5 mg, Intravenous, Q6H PRN, Mikey College T, MD   HYDROmorphone (DILAUDID) injection 0.5-1 mg, 0.5-1 mg, Intravenous, Q2H PRN, Mikey College T, MD   insulin aspart (novoLOG) injection 0-15 Units, 0-15 Units, Subcutaneous, TID WC, Mikey College T, MD, 2 Units at 03/04/23 1227   ipratropium-albuterol (DUONEB) 0.5-2.5 (3) MG/3ML nebulizer solution 3 mL, 3 mL, Nebulization, Q4H PRN, Amin, Ankit Chirag, MD   levETIRAcetam (KEPPRA) tablet 500 mg, 500 mg, Oral, BID, Mikey College T, MD, 500 mg at 03/05/23 2129   ondansetron (ZOFRAN) tablet 4 mg, 4 mg, Oral, Q6H PRN **OR** ondansetron (ZOFRAN) injection 4 mg, 4 mg, Intravenous, Q6H PRN, Mikey College T, MD   oxyCODONE (Oxy IR/ROXICODONE) immediate release tablet 5 mg, 5 mg, Oral, Q4H PRN, Mikey College T, MD, 5 mg at 03/04/23 0912   piperacillin-tazobactam (ZOSYN) IVPB 3.375 g, 3.375 g, Intravenous, Q8H, Daylene Posey, RPH, Last Rate: 12.5 mL/hr at 03/05/23 2341, 3.375 g at 03/05/23 2341   senna-docusate (Senokot-S) tablet 2 tablet, 2 tablet, Oral, QHS PRN, Amin, Ankit Chirag, MD   traZODone (DESYREL) tablet 50  mg, 50 mg, Oral, QHS PRN, Amin, Ankit Chirag, MD, 50 mg at 03/05/23 2129  Allergies: No Known Allergies     Objective  Vital signs:  Temp:  [97.7 F (36.5 C)-99.1 F (37.3 C)] 97.7 F (36.5 C) (04/25 0832) Pulse Rate:  [69-75] 69 (04/25 0832) Resp:  [15-18] 18 (04/25 0832) BP: (111-120)/(65-77) 111/67 (04/25 0832) SpO2:  [98 %-100 %] 100 % (04/25 1610)  Psychiatric Specialty Exam: Physical Exam Constitutional:      Appearance: the patient is not toxic-appearing.  Pulmonary:     Effort: Pulmonary effort is normal.  Neurological:     General: No focal deficit present.     Mental Status: the patient is alert and oriented to person, place, and time.   Review of Systems  Respiratory:  Negative for shortness of breath.   Cardiovascular:  Negative for chest pain.  Gastrointestinal:  Negative for abdominal pain, constipation,  diarrhea, nausea and vomiting.  Neurological:  Negative for headaches.      BP 111/67 (BP Location: Left Arm)   Pulse 69   Temp 97.7 F (36.5 C) (Oral)   Resp 18   Ht 6' (1.829 m)   Wt 99.5 kg   SpO2 100%   BMI 29.75 kg/m   General Appearance: Fairly Groomed  Eye Contact:  Good  Speech:  Clear and Coherent  Volume:  Normal  Mood:  Euthymic  Affect:  Congruent  Thought Process:  Coherent  Orientation:  Full (Time, Place, and Person)  Thought Content: Logical   Suicidal Thoughts:  No  Homicidal Thoughts:  No  Memory:  Immediate;   Good  Judgement:  fair  Insight:  fair  Psychomotor Activity:  Normal  Concentration:  Concentration: Good  Recall:  Good  Fund of Knowledge: Good  Language: Good  Akathisia:  No  Handed:    AIMS (if indicated): not done  Assets:  Communication Skills Desire for Improvement Financial Resources/Insurance Housing Leisure Time Physical Health  ADL's:  Intact  Cognition: WNL  Sleep:  Fair   Carlyn Reichert, MD PGY-2
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
## 2023-03-07 ENCOUNTER — Encounter (HOSPITAL_COMMUNITY): Payer: Self-pay | Admitting: Vascular Surgery

## 2023-03-07 ENCOUNTER — Encounter (HOSPITAL_COMMUNITY)
Admission: EM | Disposition: A | Discharge status: Home / Self Care | Payer: Self-pay | Source: Home / Self Care | Attending: Family Medicine

## 2023-03-07 DIAGNOSIS — M86171 Other acute osteomyelitis, right ankle and foot: Secondary | ICD-10-CM | POA: Diagnosis not present

## 2023-03-07 LAB — CBC WITH DIFFERENTIAL/PLATELET
Abs Immature Granulocytes: 0 10*3/uL (ref 0.00–0.07)
Basophils Absolute: 0 10*3/uL (ref 0.0–0.1)
Basophils Relative: 0 %
Eosinophils Absolute: 0 10*3/uL (ref 0.0–0.5)
Eosinophils Relative: 0 %
HCT: 37.9 % — ABNORMAL LOW (ref 39.0–52.0)
Hemoglobin: 12 g/dL — ABNORMAL LOW (ref 13.0–17.0)
Lymphocytes Relative: 1 %
Lymphs Abs: 0.3 10*3/uL — ABNORMAL LOW (ref 0.7–4.0)
MCH: 31.8 pg (ref 26.0–34.0)
MCHC: 31.7 g/dL (ref 30.0–36.0)
MCV: 100.5 fL — ABNORMAL HIGH (ref 80.0–100.0)
Monocytes Absolute: 1.9 10*3/uL — ABNORMAL HIGH (ref 0.1–1.0)
Monocytes Relative: 7 %
Neutro Abs: 24.6 10*3/uL — ABNORMAL HIGH (ref 1.7–7.7)
Neutrophils Relative %: 92 %
Platelets: 337 10*3/uL (ref 150–400)
RBC: 3.77 MIL/uL — ABNORMAL LOW (ref 4.22–5.81)
RDW: 14.6 % (ref 11.5–15.5)
WBC: 26.7 10*3/uL — ABNORMAL HIGH (ref 4.0–10.5)
nRBC: 0 % (ref 0.0–0.2)
nRBC: 0 /100 WBC

## 2023-03-07 LAB — CULTURE, BLOOD (ROUTINE X 2): Culture: NO GROWTH

## 2023-03-07 LAB — BASIC METABOLIC PANEL
Anion gap: 8 (ref 5–15)
BUN: 21 mg/dL — ABNORMAL HIGH (ref 6–20)
CO2: 17 mmol/L — ABNORMAL LOW (ref 22–32)
Calcium: 8.4 mg/dL — ABNORMAL LOW (ref 8.9–10.3)
Chloride: 110 mmol/L (ref 98–111)
Creatinine, Ser: 1.12 mg/dL (ref 0.61–1.24)
GFR, Estimated: >60 mL/min (ref >60)
Glucose, Bld: 162 mg/dL — ABNORMAL HIGH (ref 70–99)
Potassium: 4.7 mmol/L (ref 3.5–5.1)
Sodium: 135 mmol/L (ref 135–145)

## 2023-03-07 LAB — GLUCOSE, CAPILLARY
Glucose-Capillary: 156 mg/dL — ABNORMAL HIGH (ref 70–99)
Glucose-Capillary: 157 mg/dL — ABNORMAL HIGH (ref 70–99)
Glucose-Capillary: 109 mg/dL — ABNORMAL HIGH (ref 70–99)

## 2023-03-07 SURGERY — AMPUTATION BELOW KNEE
Anesthesia: Choice | Site: Knee | Laterality: Left

## 2023-03-07 MED ORDER — MUPIROCIN 2 % EX OINT
1.0000 | TOPICAL_OINTMENT | Freq: Two times a day (BID) | CUTANEOUS | Status: DC
  Administered 2023-03-08 – 2023-03-10 (×5): 1 via NASAL
  Filled 2023-03-07: qty 22

## 2023-03-07 MED ORDER — CHLORHEXIDINE GLUCONATE CLOTH 2 % EX PADS
6.0000 | MEDICATED_PAD | Freq: Every day | CUTANEOUS | Status: DC
  Administered 2023-03-07 – 2023-03-10 (×4): 6 via TOPICAL

## 2023-03-07 NOTE — Progress Notes (Signed)
Pt refusing meds, labs and CBG this morning d/t agitation r/t not being able to eat breakfast and wanting to go home. Pt now has allowed meds, cbg and labs to be completed. Dr. Mahala Menghini aware.
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
## 2023-03-07 NOTE — Progress Notes (Signed)
[facility]  Subjective  -no complaints   Objective 110/69 71 98.9 F (37.2 C) (Oral) (!) 24 97%  Intake/Output Summary (Last 24 hours) at 03/07/2023 1059 Last data filed at 03/07/2023 0741 Gross per 24 hour  Intake 1200 ml  Output 300 ml  Net 900 ml   Left groin without hematoma Right AT palpable   Laboratory Lab Results: Recent Labs    03/05/23 0442 03/06/23 0421  WBC 21.6* 24.8*  HGB 11.9* 11.8*  HCT 36.8* 34.5*  PLT 329 353   BMET Recent Labs    03/05/23 0442 03/06/23 0421  NA 134* 134*  K 4.3 4.5  CL 110 106  CO2 17* 17*  GLUCOSE 108* 116*  BUN 39* 29*  CREATININE 1.22 1.10  CALCIUM 8.1* 8.4*    COAG Lab Results  Component Value Date   INR 1.7 (H) 03/06/2023   INR 2.1 (H) 03/05/2023   INR 2.1 (H) 03/04/2023   No results found for: "PTT"  Assessment/Planning:  62 year old male with bilateral lower extremity ulcerations.  Yesterday he underwent left transfemoral access with a right anterior tibial angioplasty with excellent results.  Right AT is palpable.  He is still deciding about plan for left below-knee amputation by Dr. Lajoyce Corners.  Attempting limb salvage of the right leg.  Please call vascular if any questions or concerns.  Cephus Shelling 03/07/2023 10:59 AM --

## 2023-03-07 NOTE — Interval H&P Note (Signed)
History and Physical Interval Note:  03/07/2023 6:51 AM  Jesse [name]  has presented today for surgery, with the diagnosis of Osteomyelitis Left Foot.  The various methods of treatment have been discussed with the patient and family. After consideration of risks, benefits and other options for treatment, the patient has consented to  Procedure(s): LEFT BELOW KNEE AMPUTATION (Left) as a surgical intervention.  The patient's history has been reviewed, patient examined, no change in status, stable for surgery.  I have reviewed the patient's chart and labs.  Questions were answered to the patient's satisfaction.     Jesse [name]

## 2023-03-07 NOTE — Progress Notes (Signed)
Sister [name] called with a complaint about patient information being given out to Maretta Los, who apparently has told staff that he is the patient's brother but is actually a family friend. Dorinda Hill was privy to some patient info that [name] had not yet come to know, and she stated she usually gets her updates from the other sister, [name]. Apologized to [place] and explained we would confirm with patient how he'd like his health information protected when people call and who he'd like information given to. Patient confirmed Su Hilt, and Dorinda Hill are all acceptable to give information to; did not opt for a password. Patient's contacts in chart have been updated with these 3 names.

## 2023-03-08 ENCOUNTER — Inpatient Hospital Stay (HOSPITAL_COMMUNITY): Payer: Medicaid Other

## 2023-03-08 DIAGNOSIS — M86171 Other acute osteomyelitis, right ankle and foot: Secondary | ICD-10-CM | POA: Diagnosis not present

## 2023-03-08 LAB — CBC WITH DIFFERENTIAL/PLATELET
Abs Immature Granulocytes: 0.43 10*3/uL — ABNORMAL HIGH (ref 0.00–0.07)
Basophils Absolute: 0.1 10*3/uL (ref 0.0–0.1)
Basophils Relative: 0 %
Eosinophils Absolute: 0.2 10*3/uL (ref 0.0–0.5)
Eosinophils Relative: 1 %
HCT: 33.2 % — ABNORMAL LOW (ref 39.0–52.0)
Hemoglobin: 10.3 g/dL — ABNORMAL LOW (ref 13.0–17.0)
Immature Granulocytes: 2 %
Lymphocytes Relative: 8 %
Lymphs Abs: 1.8 10*3/uL (ref 0.7–4.0)
MCH: 31.5 pg (ref 26.0–34.0)
MCHC: 31 g/dL (ref 30.0–36.0)
MCV: 101.5 fL — ABNORMAL HIGH (ref 80.0–100.0)
Monocytes Absolute: 1.7 10*3/uL — ABNORMAL HIGH (ref 0.1–1.0)
Monocytes Relative: 7 %
Neutro Abs: 19.3 10*3/uL — ABNORMAL HIGH (ref 1.7–7.7)
Neutrophils Relative %: 82 %
Platelets: 330 10*3/uL (ref 150–400)
RBC: 3.27 MIL/uL — ABNORMAL LOW (ref 4.22–5.81)
RDW: 14.6 % (ref 11.5–15.5)
WBC: 23.5 10*3/uL — ABNORMAL HIGH (ref 4.0–10.5)
nRBC: 0 % (ref 0.0–0.2)

## 2023-03-08 LAB — BASIC METABOLIC PANEL
Anion gap: 6 (ref 5–15)
BUN: 21 mg/dL — ABNORMAL HIGH (ref 6–20)
CO2: 19 mmol/L — ABNORMAL LOW (ref 22–32)
Calcium: 8.3 mg/dL — ABNORMAL LOW (ref 8.9–10.3)
Chloride: 109 mmol/L (ref 98–111)
Creatinine, Ser: 1.11 mg/dL (ref 0.61–1.24)
GFR, Estimated: >60 mL/min (ref >60)
Glucose, Bld: 117 mg/dL — ABNORMAL HIGH (ref 70–99)
Potassium: 4.3 mmol/L (ref 3.5–5.1)
Sodium: 134 mmol/L — ABNORMAL LOW (ref 135–145)

## 2023-03-08 LAB — GLUCOSE, CAPILLARY
Glucose-Capillary: 102 mg/dL — ABNORMAL HIGH (ref 70–99)
Glucose-Capillary: 107 mg/dL — ABNORMAL HIGH (ref 70–99)
Glucose-Capillary: 122 mg/dL — ABNORMAL HIGH (ref 70–99)
Glucose-Capillary: 117 mg/dL — ABNORMAL HIGH (ref 70–99)

## 2023-03-08 LAB — CULTURE, BLOOD (ROUTINE X 2)

## 2023-03-08 MED ORDER — GADOBUTROL 1 MMOL/ML IV SOLN
10.0000 mL | Freq: Once | INTRAVENOUS | Status: AC | PRN
  Administered 2023-03-08: 10 mL via INTRAVENOUS
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
## 2023-03-08 NOTE — Plan of Care (Signed)

## 2023-03-09 DIAGNOSIS — M86171 Other acute osteomyelitis, right ankle and foot: Secondary | ICD-10-CM | POA: Diagnosis not present

## 2023-03-09 LAB — COMPREHENSIVE METABOLIC PANEL
ALT: 13 U/L (ref 0–44)
AST: 15 U/L (ref 15–41)
Albumin: 1.6 g/dL — ABNORMAL LOW (ref 3.5–5.0)
Alkaline Phosphatase: 115 U/L (ref 38–126)
Anion gap: 8 (ref 5–15)
BUN: 21 mg/dL — ABNORMAL HIGH (ref 6–20)
CO2: 19 mmol/L — ABNORMAL LOW (ref 22–32)
Calcium: 8.2 mg/dL — ABNORMAL LOW (ref 8.9–10.3)
Chloride: 107 mmol/L (ref 98–111)
Creatinine, Ser: 1.21 mg/dL (ref 0.61–1.24)
GFR, Estimated: >60 mL/min (ref >60)
Glucose, Bld: 112 mg/dL — ABNORMAL HIGH (ref 70–99)
Potassium: 4.4 mmol/L (ref 3.5–5.1)
Sodium: 134 mmol/L — ABNORMAL LOW (ref 135–145)
Total Bilirubin: 0.8 mg/dL (ref 0.3–1.2)
Total Protein: 6.6 g/dL (ref 6.5–8.1)

## 2023-03-09 LAB — CBC
HCT: 33.3 % — ABNORMAL LOW (ref 39.0–52.0)
Hemoglobin: 10.8 g/dL — ABNORMAL LOW (ref 13.0–17.0)
MCH: 32.6 pg (ref 26.0–34.0)
MCHC: 32.4 g/dL (ref 30.0–36.0)
MCV: 100.6 fL — ABNORMAL HIGH (ref 80.0–100.0)
Platelets: 339 10*3/uL (ref 150–400)
RBC: 3.31 MIL/uL — ABNORMAL LOW (ref 4.22–5.81)
RDW: 14.4 % (ref 11.5–15.5)
WBC: 24 10*3/uL — ABNORMAL HIGH (ref 4.0–10.5)
nRBC: 0 % (ref 0.0–0.2)

## 2023-03-09 LAB — GLUCOSE, CAPILLARY
Glucose-Capillary: 115 mg/dL — ABNORMAL HIGH (ref 70–99)
Glucose-Capillary: 158 mg/dL — ABNORMAL HIGH (ref 70–99)
Glucose-Capillary: 158 mg/dL — ABNORMAL HIGH (ref 70–99)
Glucose-Capillary: 124 mg/dL — ABNORMAL HIGH (ref 70–99)

## 2023-03-09 LAB — PROTIME-INR
INR: 1.7 — ABNORMAL HIGH (ref 0.8–1.2)
Prothrombin Time: 20 seconds — ABNORMAL HIGH (ref 11.4–15.2)

## 2023-03-09 NOTE — Care Plan (Signed)
Discussed with Dr. Lajoyce Corners of orthopedics-he has discussed the case with Dr. Roda Shutters of orthopedics who is willing to consult on patient-based on the pictures of the wounds in the chart, it appears that patient will need an above-knee amputation I went back to discuss this with the patient--he has decided he wants to leave tomorrow and that his friend/brother-in-law will pick him up-we will plan on discharge tomorrow morning  Patient understands the literal risks to life and limb based on all of our extensive conversations including the 1 previously today  > 20 minutes care coordination time  Pleas Koch, MD [facility] 5:12 PM
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
## 2023-03-09 NOTE — Plan of Care (Signed)

## 2023-03-10 ENCOUNTER — Other Ambulatory Visit (HOSPITAL_COMMUNITY): Payer: Self-pay

## 2023-03-10 ENCOUNTER — Emergency Department (HOSPITAL_COMMUNITY)
Admission: EM | Admit: 2023-03-10 | Discharge: 2023-03-11 | Discharge status: Against Medical Advice | Payer: Medicaid Other | Source: Home / Self Care | Attending: Emergency Medicine | Admitting: Emergency Medicine

## 2023-03-10 ENCOUNTER — Emergency Department (HOSPITAL_COMMUNITY): Payer: Medicaid Other

## 2023-03-10 DIAGNOSIS — Z5329 Procedure and treatment not carried out because of patient's decision for other reasons: Secondary | ICD-10-CM | POA: Insufficient documentation

## 2023-03-10 DIAGNOSIS — I96 Gangrene, not elsewhere classified: Secondary | ICD-10-CM | POA: Insufficient documentation

## 2023-03-10 DIAGNOSIS — R7989 Other specified abnormal findings of blood chemistry: Secondary | ICD-10-CM | POA: Insufficient documentation

## 2023-03-10 DIAGNOSIS — D72829 Elevated white blood cell count, unspecified: Secondary | ICD-10-CM | POA: Insufficient documentation

## 2023-03-10 LAB — PROTIME-INR
INR: 1.5 — ABNORMAL HIGH (ref 0.8–1.2)
Prothrombin Time: 18.1 seconds — ABNORMAL HIGH (ref 11.4–15.2)

## 2023-03-10 LAB — CBC
HCT: 33.9 % — ABNORMAL LOW (ref 39.0–52.0)
Hemoglobin: 10.5 g/dL — ABNORMAL LOW (ref 13.0–17.0)
MCH: 31.5 pg (ref 26.0–34.0)
MCHC: 31 g/dL (ref 30.0–36.0)
MCV: 101.8 fL — ABNORMAL HIGH (ref 80.0–100.0)
Platelets: 356 10*3/uL (ref 150–400)
RBC: 3.33 MIL/uL — ABNORMAL LOW (ref 4.22–5.81)
RDW: 14.6 % (ref 11.5–15.5)
WBC: 23 10*3/uL — ABNORMAL HIGH (ref 4.0–10.5)
nRBC: 0 % (ref 0.0–0.2)

## 2023-03-10 LAB — CBC WITH DIFFERENTIAL/PLATELET
Abs Immature Granulocytes: 0.33 10*3/uL — ABNORMAL HIGH (ref 0.00–0.07)
Basophils Absolute: 0.1 10*3/uL (ref 0.0–0.1)
Basophils Relative: 0 %
Eosinophils Absolute: 0.2 10*3/uL (ref 0.0–0.5)
Eosinophils Relative: 1 %
HCT: 34.2 % — ABNORMAL LOW (ref 39.0–52.0)
Hemoglobin: 10.9 g/dL — ABNORMAL LOW (ref 13.0–17.0)
Immature Granulocytes: 1 %
Lymphocytes Relative: 7 %
Lymphs Abs: 1.8 10*3/uL (ref 0.7–4.0)
MCH: 32.2 pg (ref 26.0–34.0)
MCHC: 31.9 g/dL (ref 30.0–36.0)
MCV: 100.9 fL — ABNORMAL HIGH (ref 80.0–100.0)
Monocytes Absolute: 2.4 10*3/uL — ABNORMAL HIGH (ref 0.1–1.0)
Monocytes Relative: 10 %
Neutro Abs: 20.4 10*3/uL — ABNORMAL HIGH (ref 1.7–7.7)
Neutrophils Relative %: 81 %
Platelets: 345 10*3/uL (ref 150–400)
RBC: 3.39 MIL/uL — ABNORMAL LOW (ref 4.22–5.81)
RDW: 14.5 % (ref 11.5–15.5)
WBC: 25.1 10*3/uL — ABNORMAL HIGH (ref 4.0–10.5)
nRBC: 0 % (ref 0.0–0.2)

## 2023-03-10 LAB — COMPREHENSIVE METABOLIC PANEL
ALT: 13 U/L (ref 0–44)
ALT: 14 U/L (ref 0–44)
AST: 16 U/L (ref 15–41)
AST: 17 U/L (ref 15–41)
Albumin: 1.7 g/dL — ABNORMAL LOW (ref 3.5–5.0)
Albumin: 1.9 g/dL — ABNORMAL LOW (ref 3.5–5.0)
Alkaline Phosphatase: 110 U/L (ref 38–126)
Alkaline Phosphatase: 114 U/L (ref 38–126)
Anion gap: 8 (ref 5–15)
Anion gap: 12 (ref 5–15)
BUN: 24 mg/dL — ABNORMAL HIGH (ref 6–20)
BUN: 26 mg/dL — ABNORMAL HIGH (ref 6–20)
CO2: 17 mmol/L — ABNORMAL LOW (ref 22–32)
CO2: 16 mmol/L — ABNORMAL LOW (ref 22–32)
Calcium: 8.2 mg/dL — ABNORMAL LOW (ref 8.9–10.3)
Calcium: 8.4 mg/dL — ABNORMAL LOW (ref 8.9–10.3)
Chloride: 109 mmol/L (ref 98–111)
Chloride: 103 mmol/L (ref 98–111)
Creatinine, Ser: 1.12 mg/dL (ref 0.61–1.24)
Creatinine, Ser: 1.38 mg/dL — ABNORMAL HIGH (ref 0.61–1.24)
GFR, Estimated: >60 mL/min (ref >60)
GFR, Estimated: 59 mL/min — ABNORMAL LOW (ref >60)
Glucose, Bld: 120 mg/dL — ABNORMAL HIGH (ref 70–99)
Glucose, Bld: 115 mg/dL — ABNORMAL HIGH (ref 70–99)
Potassium: 4.3 mmol/L (ref 3.5–5.1)
Potassium: 4.6 mmol/L (ref 3.5–5.1)
Sodium: 134 mmol/L — ABNORMAL LOW (ref 135–145)
Sodium: 131 mmol/L — ABNORMAL LOW (ref 135–145)
Total Bilirubin: 0.7 mg/dL (ref 0.3–1.2)
Total Bilirubin: 0.6 mg/dL (ref 0.3–1.2)
Total Protein: 7 g/dL (ref 6.5–8.1)
Total Protein: 7.4 g/dL (ref 6.5–8.1)

## 2023-03-10 LAB — LACTIC ACID, PLASMA: Lactic Acid, Venous: 1.3 mmol/L (ref 0.5–1.9)

## 2023-03-10 LAB — APTT: aPTT: 49 seconds — ABNORMAL HIGH (ref 24–36)

## 2023-03-10 LAB — GLUCOSE, CAPILLARY: Glucose-Capillary: 106 mg/dL — ABNORMAL HIGH (ref 70–99)

## 2023-03-10 MED ORDER — ASPIRIN 81 MG PO TBEC
81.0000 mg | DELAYED_RELEASE_TABLET | Freq: Every day | ORAL | 12 refills | Status: AC
  Filled 2023-03-10: qty 30, 30d supply, fill #0

## 2023-03-10 MED ORDER — ATORVASTATIN CALCIUM 40 MG PO TABS
40.0000 mg | ORAL_TABLET | Freq: Every day | ORAL | 0 refills | Status: AC
  Filled 2023-03-10: qty 30, 30d supply, fill #0

## 2023-03-10 MED ORDER — OXYCODONE HCL 5 MG PO TABS
5.0000 mg | ORAL_TABLET | ORAL | 0 refills | Status: DC | PRN
  Filled 2023-03-10: qty 30, 5d supply, fill #0

## 2023-03-10 MED ORDER — PIPERACILLIN-TAZOBACTAM 3.375 G IVPB: 3.3750 g | Freq: Three times a day (TID) | INTRAVENOUS | Status: DC

## 2023-03-10 MED ORDER — ACETAMINOPHEN 325 MG PO TABS: 650.0000 mg | ORAL_TABLET | ORAL | Status: DC | PRN

## 2023-03-10 MED ORDER — GUAIFENESIN 100 MG/5ML PO LIQD
5.0000 mL | ORAL | 0 refills | Status: DC | PRN
  Filled 2023-03-10: qty 120, 4d supply, fill #0

## 2023-03-10 MED ORDER — AMOXICILLIN-POT CLAVULANATE 875-125 MG PO TABS
2.0000 | ORAL_TABLET | Freq: Two times a day (BID) | ORAL | 0 refills | Status: DC
  Filled 2023-03-10: qty 60, 15d supply, fill #0

## 2023-03-10 MED ORDER — PIPERACILLIN-TAZOBACTAM 3.375 G IVPB 30 MIN
3.3750 g | Freq: Once | INTRAVENOUS | Status: AC
  Administered 2023-03-10: 3.375 g via INTRAVENOUS
  Filled 2023-03-10: qty 50

## 2023-03-10 MED ORDER — CLOPIDOGREL BISULFATE 75 MG PO TABS
75.0000 mg | ORAL_TABLET | Freq: Every day | ORAL | 11 refills | Status: AC
  Filled 2023-03-10: qty 30, 30d supply, fill #0

## 2023-03-10 NOTE — TOC Transition Note (Addendum)
Transition of Care [facility]) - CM/SW Discharge Note   Patient Details  Name: Jesse [name] MRN: [id number] Date of Birth: June 02, 1961  Transition [facility] Care [facility] CM/SW Contact:  Gala Lewandowsky, RN Phone Number: 03/10/2023, 11:47 AM   Clinical Narrative:  Case Manager spoke with the patient regarding insurance and home health services. Case Manager called all of the agencies on the Medicare.gov list in the patients service area. [facility] was the only agency willing to possibly accept; however, patient does not have a teachable caregiver in the home that the agency can educate on wound care. [facility] is not able to assist with home health services. Patient is aware that he will not have any any home health services for wound care. Case Manager discussed what the patients plan is and he states he wants a second opinion. Case Manager did discuss that he will likely be a high risk for readmission. 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 Case Manager discussed the wound care clinic with the patient and he is agreeable. Wound Care appointment scheduled for May 20 th @ 9:30 am-MD is agreeable to plan of care. Information is on the AVS. Patient called his friend Roe Coombs and he will be able to take the patient to appointments. Patient has DME WC, RW, and BSC. No further needs identified at this time.   Final next level of care: Home/Self Care Barriers to Discharge: No Barriers Identified   Patient Goals and CMS Choice CMS Medicare.gov Compare Post Acute Care list provided to:: Patient Choice offered to / list presented to : Patient  Discharge Plan and Services Additional resources added to the After Visit Summary for   In-house Referral: Clinical Social Work Discharge Planning Services: CM Consult Post Acute Care Choice: NA            DME Agency: NA       HH Arranged: NA (Unable to arrange no family for assistance with wound care.)    Social  Determinants of Health (SDOH) Interventions SDOH Screenings   Food Insecurity: Food Insecurity Present (03/03/2023)  Housing: Low Risk  (03/03/2023)  Recent Concern: Housing - High Risk (01/25/2023)  Transportation Needs: No Transportation Needs (03/03/2023)  Utilities: Not At Risk (03/03/2023)  Depression (PHQ2-9): Medium Risk (10/28/2022)  Tobacco Use: Low Risk  (03/07/2023)    Readmission Risk Interventions    03/05/2023    3:13 PM  Readmission Risk Prevention Plan  Transportation Screening Complete  PCP or Specialist Appt within 5-7 Days Complete  Home Care Screening Complete  Medication Review (RN CM) Complete

## 2023-03-10 NOTE — Discharge Summary (Signed)
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 Description BLOOD RIGHT ANTECUBITAL  Final   Special Requests   Final    BOTTLES DRAWN AEROBIC AND ANAEROBIC Blood Culture results may not be optimal due to an inadequate volume of blood received in culture bottles   Culture   Final    NO GROWTH 5 DAYS Performed at [facility], [address], [place], [place] [address]    Report Status 03/08/2023 FINAL  Final  MRSA Next Gen by PCR, Nasal     Status: None   Collection Time: 03/03/23  1:25 PM   Specimen: Nasal Mucosa; Nasal Swab  Result Value Ref Range Status   MRSA by PCR Next Gen NOT DETECTED NOT DETECTED Final    Comment: (NOTE) The GeneXpert MRSA Assay (FDA approved for NASAL specimens only), is one component of a comprehensive MRSA colonization surveillance program. It is not intended to diagnose MRSA infection nor to guide or monitor treatment for MRSA infections. Test performance is not FDA approved in patients less than 31 years old. Performed at [facility], [address]  [place], [place] [address]   C Difficile Quick Screen w PCR reflex  Status: None   Collection Time: 03/03/23 11:04 PM   Specimen: STOOL  Result Value Ref Range Status   C Diff antigen NEGATIVE NEGATIVE Final   C Diff toxin NEGATIVE NEGATIVE Final   C Diff interpretation No C. difficile detected.  Final    Comment: Performed at [facility], [address] [place], [place] [address]  Surgical PCR screen     Status: Abnormal   Collection Time: 03/04/23  1:27 PM   Specimen: Nasal Mucosa; Nasal Swab  Result Value Ref Range Status   MRSA, PCR NEGATIVE NEGATIVE Final   Staphylococcus aureus POSITIVE (A) NEGATIVE Final    Comment: (NOTE) The Xpert SA Assay (FDA approved for NASAL specimens in patients 41 years of age and older), is one component of a comprehensive surveillance program. It is not intended to diagnose infection nor to guide or monitor treatment. Performed at [facility], [address], [place], [place] [address]      Labs: Basic Metabolic Panel: Recent Labs  Lab 03/05/23 0442 03/06/23 0421 03/07/23 1115 03/08/23 0230 03/09/23 0142 03/10/23 0206  NA 134* 134* 135 134* 134* 134*  K 4.3 4.5 4.7 4.3 4.4 4.3  CL 110 106 110 109 107 109  CO2 17* 17* 17* 19* 19* 17*  GLUCOSE 108* 116* 162* 117* 112* 120*  BUN 39* 29* 21* 21* 21* 24*  CREATININE 1.22 1.10 1.12 1.11 1.21 1.12  CALCIUM 8.1* 8.4* 8.4* 8.3* 8.2* 8.2*  MG 1.6*  --   --   --   --   --    Liver Function Tests: Recent Labs  Lab 03/03/23 0922 03/09/23 0142 03/10/23 0206  AST 65* 15 16  ALT 38 13 13  ALKPHOS 172* 115 110  BILITOT 1.1 0.8 0.7  PROT 9.1* 6.6 7.0  ALBUMIN 2.1* 1.6* 1.7*   No results for input(s): "LIPASE", "AMYLASE" in the last 168 hours. No results for input(s): "AMMONIA" in the last 168 hours. CBC: Recent Labs  Lab 03/03/23 0922 03/04/23 0430 03/06/23 0421 03/07/23 1115 03/08/23 0230 03/09/23 0142 03/10/23 0206  WBC 22.4*   < > 24.8* 26.7* 23.5* 24.0* 23.0*   NEUTROABS 18.0*  --  20.7* 24.6* 19.3*  --   --   HGB 11.8*   < > 11.8* 12.0* 10.3* 10.8* 10.5*  HCT 35.9*   < > 34.5* 37.9* 33.2* 33.3* 33.9*  MCV 99.4   < > 96.6 100.5* 101.5* 100.6* 101.8*  PLT 336   < > 353 337 330 339 356   < > = values in this interval not displayed.   Cardiac Enzymes: Recent Labs  Lab 03/03/23 0922  CKTOTAL 140   BNP: BNP (last 3 results) No results for input(s): "BNP" in the last 8760 hours.  ProBNP (last 3 results) No results for input(s): "PROBNP" in the last 8760 hours.  CBG: Recent Labs  Lab 03/08/23 2207 03/09/23 0849 03/09/23 1249 03/09/23 1557 03/09/23 2128  GLUCAP 117* 115* 158* 158* 124*       Signed:  Rhetta Mura MD   [facility] 03/10/2023, 7:57 AM

## 2023-03-10 NOTE — ED Provider Notes (Signed)
[facility]-EMERGENCY DEPT [facility] Emergency Department Provider Note MRN:  [id number]  Arrival date & time: 03/11/23     Chief Complaint   Dehydration   History of Present Illness   Jesse [name] is a 62 y.o. year-old male presents to the ED with chief complaint of gangrenous left foot.  Recent admission for the same.  Has previously declined amputation and was discharged on Augmentin.  States that he got home today and "became dehydrated." Reports 8/10 pain.   History provided by patient.   Review of Systems  Pertinent positive and negative review of systems noted in HPI.    Physical Exam   Vitals:   03/11/23 0000 03/11/23 0015  BP: 105/69 110/60  Pulse: 79   Resp: (!) 25 (!) 28  Temp:    SpO2: 99%     CONSTITUTIONAL:  chronically ill-appearing, NAD NEURO:  Alert and oriented x 3, CN 3-12 grossly intact EYES:  eyes equal and reactive ENT/NECK:  Supple, no stridor  CARDIO:  normal rate, regular rhythm, appears well-perfused  PULM:  No respiratory distress, CTAB GI/GU:  non-distended,  MSK/SPINE: feet as pictured, necrotic smelling SKIN:  as pictured      *Additional and/or pertinent findings included in MDM below  Diagnostic and Interventional Summary    EKG Interpretation  Date/Time:    Ventricular Rate:    PR Interval:    QRS Duration:   QT Interval:    QTC Calculation:   R Axis:     Text Interpretation:         Labs Reviewed  COMPREHENSIVE METABOLIC PANEL - Abnormal; Notable for the following components:      Result Value   Sodium 131 (*)    CO2 16 (*)    Glucose, Bld 115 (*)    BUN 26 (*)    Creatinine, Ser 1.38 (*)    Calcium 8.4 (*)    Albumin 1.9 (*)    GFR, Estimated 59 (*)    All other components within normal limits  CBC WITH DIFFERENTIAL/PLATELET - Abnormal; Notable for the following components:   WBC 25.1 (*)    RBC 3.39 (*)    Hemoglobin 10.9 (*)    HCT 34.2 (*)    MCV 100.9 (*)    Neutro Abs 20.4 (*)    Monocytes  Absolute 2.4 (*)    Abs Immature Granulocytes 0.33 (*)    All other components within normal limits  PROTIME-INR - Abnormal; Notable for the following components:   Prothrombin Time 18.1 (*)    INR 1.5 (*)    All other components within normal limits  APTT - Abnormal; Notable for the following components:   aPTT 49 (*)    All other components within normal limits  CULTURE, BLOOD (ROUTINE X 2)  CULTURE, BLOOD (ROUTINE X 2)  LACTIC ACID, PLASMA  LACTIC ACID, PLASMA  URINALYSIS, ROUTINE W REFLEX MICROSCOPIC    DG Foot Complete Left  Final Result    DG Chest 1 View  Final Result      Medications  piperacillin-tazobactam (ZOSYN) IVPB 3.375 g (0 g Intravenous Stopped 03/11/23 0016)    Followed by  piperacillin-tazobactam (ZOSYN) IVPB 3.375 g (has no administration in time range)     Procedures  /  Critical Care .Critical Care  Performed by: Roxy Horseman, PA-C Authorized by: Roxy Horseman, PA-C   Critical care provider statement:    Critical care time (minutes):  35   Critical care was time spent personally by  me on the following activities:  Development of treatment plan with patient or surrogate, discussions with consultants, evaluation of patient's response to treatment, examination of patient, ordering and review of laboratory studies, ordering and review of radiographic studies, ordering and performing treatments and interventions, pulse oximetry, re-evaluation of patient's condition and review of old charts   ED Course and Medical Decision Making  I have reviewed the triage vital signs, the nursing notes, and pertinent available records from the EMR.  Social Determinants Affecting Complexity of Care: Patient has no clinically significant social determinants affecting this chief complaint..   ED Course:    Medical Decision Making Amount and/or Complexity of Data Reviewed Labs: ordered.    Details: Normal lactic Persistently elevated WBC at 25 Mildly elevated  creatinine Radiology: ordered and independent interpretation performed.    Details: Gas in soft tissue of left foot ECG/medicine tests: ordered.  Risk Prescription drug management. Decision regarding hospitalization.     Consultants: Consults deferred due to patient declining admission.   Treatment and Plan: Patient's exam and diagnostic results are concerning for worsening gangrene.  Feel that patient will need admission to the hospital for further treatment and evaluation.   Patients that they would like to leave.  I have discussed my concerns with the patient about them leaving without completing the evaluation.   Patient has capacity to make own medical decisions.  Patient presents with gangrenous left foot.  Worsening.  Symptoms include: Pain and swelling of left foot.  Concern for: worsening infection.  Study limitations and other tests offered include: Offered admission and consult for amputation as these would be life saving procedures.  Treatment and recommended follow-up include: Continue augmentin at home.  Follow-up with Dr. Lajoyce Corners.  Return if worsening.  I do not feel that the patient should leave prior to completing their workup. I have discussed the above symptoms, initial findings, study limitations, and treatment plan with the patient. Patient is not altered, does not have any distracting issues, and has capacity to make decisions for themselves. Patient places themselves at risk of sepsis, worsening symptoms, disability, morbidity and/or death.  Patient was just discharged from the hospital this morning.  He repeated declines amputation.  He was seen by psychiatry and found to have capacity to make his own medical decisions.  He has capacity tonight and is not altered.  As we cannot force him to undergo amputation or be admitted to the hospital, despite expressing in great detail the anticipated progression of this disease, I will discharge the patient at his request.   He says he'll probably return in a few days.  I told him he may not survive that long.  He acknowledges this and desires to be discharged.     Final Clinical Impressions(s) / ED Diagnoses     ICD-10-CM   1. Gangrene (HCC)  Naomi.[place]       ED Discharge Orders     None         Discharge Instructions Discussed with and Provided to Patient:   Discharge Instructions   None      Felipa Furnace 03/11/23 Bobbe Medico, MD 03/19/23 2014

## 2023-03-10 NOTE — Progress Notes (Signed)
CSW received consult for food resources for patient. CSW spoke with patient at bedside. Patient reports PTA he comes from home alone. Patient reports plan is to return home at dc. Patient reports his friend Jesse [name] will pick him up when ready for dc. CSW offered patient food resources. Patient accepted. SDOH screening complete. All questions answered. No further questions reported at this time.

## 2023-03-10 NOTE — ED Triage Notes (Signed)
Pt brougt in by [facility] from home. Was DC earlier today at 1600, has been dehydrated ever since, dry mouth and dizzy from infection. A/O x4.

## 2023-03-11 ENCOUNTER — Emergency Department (HOSPITAL_COMMUNITY)
Admission: EM | Admit: 2023-03-11 | Discharge: 2023-03-11 | Discharge status: Against Medical Advice | Payer: Medicaid Other | Source: Home / Self Care | Attending: Emergency Medicine | Admitting: Emergency Medicine

## 2023-03-11 DIAGNOSIS — Z5321 Procedure and treatment not carried out due to patient leaving prior to being seen by health care provider: Secondary | ICD-10-CM | POA: Insufficient documentation

## 2023-03-11 DIAGNOSIS — I96 Gangrene, not elsewhere classified: Secondary | ICD-10-CM | POA: Insufficient documentation

## 2023-03-11 DIAGNOSIS — M79671 Pain in right foot: Secondary | ICD-10-CM

## 2023-03-11 LAB — URINALYSIS, ROUTINE W REFLEX MICROSCOPIC
Bacteria, UA: NONE SEEN
Bilirubin Urine: NEGATIVE
Glucose, UA: NEGATIVE mg/dL
Ketones, ur: NEGATIVE mg/dL
Nitrite: NEGATIVE
Protein, ur: NEGATIVE mg/dL
Specific Gravity, Urine: 1.016 (ref 1.005–1.030)
pH: 5 (ref 5.0–8.0)

## 2023-03-11 LAB — CULTURE, BLOOD (ROUTINE X 2)

## 2023-03-11 NOTE — ED Triage Notes (Addendum)
Patient reports chronic left foot infection for several months , he was just discharged here this evening for the same complaint. X-ray/Blood tests done .

## 2023-03-11 NOTE — ED Provider Notes (Signed)
Seen less than 2 hours ago for bilateral foot gangrene.  It was recommended that patient stay in the hospital for IV antibiotics and evaluation for likely amputation.  Patient declined and left AGAINST MEDICAL ADVICE.  Patient had recently been admitted for the same and left AMA at that time.  He has had psychiatric evaluation and found to have capacity to make his own medical decisions.  He checked back in less than an hour after being discharged for foot pain and then decided that he wanted to leave again prior to my evaluation.  I did see the patient in the hallway and inquired as to why he wanted to leave to which patient stated that he had something personal to do at home and that he will return.  He did not want to provide any additional information.  Patient was able to provide understanding as to his medical situation and was aware that his condition can lead to death if not treated appropriately.  Patient again signed out AGAINST MEDICAL ADVICE.   Nira Conn, MD 03/11/23 [phone]

## 2023-03-11 NOTE — Discharge Instructions (Addendum)
This infection will kill you.  Return to the ER if you want to proceed with treatment and surgery.  If you choose not to return, I'd recommend setting your affairs in order for when to pass.

## 2023-03-11 NOTE — ED Notes (Signed)
PT decided to leave AMA from emergency department. Pt educated on the risks of leaving AMA by provider and this RN. Patient AAOX4 and able to make own decisions regarding medical care at this time. Vitals stable. No acute respiratory distress. Wounds dressed with xeroform dressings and Kerlix with socks placed over top.   AMA form signed by patient with witness by this RN.

## 2023-03-12 ENCOUNTER — Other Ambulatory Visit: Payer: Self-pay

## 2023-03-12 ENCOUNTER — Inpatient Hospital Stay (HOSPITAL_COMMUNITY)
Admission: EM | Admit: 2023-03-12 | Discharge: 2023-03-18 | DRG: 240 | Disposition: A | Discharge status: Rehabilitation Facility | Payer: Medicaid Other | Attending: Internal Medicine | Admitting: Internal Medicine

## 2023-03-12 DIAGNOSIS — Z794 Long term (current) use of insulin: Secondary | ICD-10-CM

## 2023-03-12 DIAGNOSIS — E860 Dehydration: Secondary | ICD-10-CM | POA: Diagnosis present

## 2023-03-12 DIAGNOSIS — M86172 Other acute osteomyelitis, left ankle and foot: Secondary | ICD-10-CM | POA: Diagnosis not present

## 2023-03-12 DIAGNOSIS — I872 Venous insufficiency (chronic) (peripheral): Secondary | ICD-10-CM | POA: Diagnosis present

## 2023-03-12 DIAGNOSIS — R7989 Other specified abnormal findings of blood chemistry: Secondary | ICD-10-CM | POA: Diagnosis not present

## 2023-03-12 DIAGNOSIS — L97519 Non-pressure chronic ulcer of other part of right foot with unspecified severity: Secondary | ICD-10-CM | POA: Diagnosis present

## 2023-03-12 DIAGNOSIS — L97419 Non-pressure chronic ulcer of right heel and midfoot with unspecified severity: Secondary | ICD-10-CM | POA: Diagnosis present

## 2023-03-12 DIAGNOSIS — E11621 Type 2 diabetes mellitus with foot ulcer: Secondary | ICD-10-CM | POA: Diagnosis present

## 2023-03-12 DIAGNOSIS — Z89512 Acquired absence of left leg below knee: Secondary | ICD-10-CM | POA: Diagnosis not present

## 2023-03-12 DIAGNOSIS — R799 Abnormal finding of blood chemistry, unspecified: Secondary | ICD-10-CM | POA: Diagnosis not present

## 2023-03-12 DIAGNOSIS — N289 Disorder of kidney and ureter, unspecified: Secondary | ICD-10-CM | POA: Diagnosis present

## 2023-03-12 DIAGNOSIS — Z8619 Personal history of other infectious and parasitic diseases: Secondary | ICD-10-CM

## 2023-03-12 DIAGNOSIS — E872 Acidosis, unspecified: Secondary | ICD-10-CM | POA: Diagnosis present

## 2023-03-12 DIAGNOSIS — M868X6 Other osteomyelitis, lower leg: Secondary | ICD-10-CM | POA: Diagnosis not present

## 2023-03-12 DIAGNOSIS — M25561 Pain in right knee: Secondary | ICD-10-CM | POA: Diagnosis not present

## 2023-03-12 DIAGNOSIS — D62 Acute posthemorrhagic anemia: Secondary | ICD-10-CM | POA: Diagnosis not present

## 2023-03-12 DIAGNOSIS — E1152 Type 2 diabetes mellitus with diabetic peripheral angiopathy with gangrene: Principal | ICD-10-CM | POA: Diagnosis present

## 2023-03-12 DIAGNOSIS — E1140 Type 2 diabetes mellitus with diabetic neuropathy, unspecified: Secondary | ICD-10-CM | POA: Diagnosis present

## 2023-03-12 DIAGNOSIS — Z5982 Transportation insecurity: Secondary | ICD-10-CM

## 2023-03-12 DIAGNOSIS — E1151 Type 2 diabetes mellitus with diabetic peripheral angiopathy without gangrene: Secondary | ICD-10-CM | POA: Diagnosis not present

## 2023-03-12 DIAGNOSIS — R944 Abnormal results of kidney function studies: Secondary | ICD-10-CM | POA: Diagnosis present

## 2023-03-12 DIAGNOSIS — M869 Osteomyelitis, unspecified: Secondary | ICD-10-CM | POA: Diagnosis present

## 2023-03-12 DIAGNOSIS — E871 Hypo-osmolality and hyponatremia: Secondary | ICD-10-CM | POA: Diagnosis present

## 2023-03-12 DIAGNOSIS — D649 Anemia, unspecified: Secondary | ICD-10-CM | POA: Diagnosis present

## 2023-03-12 DIAGNOSIS — I10 Essential (primary) hypertension: Secondary | ICD-10-CM | POA: Diagnosis not present

## 2023-03-12 DIAGNOSIS — I739 Peripheral vascular disease, unspecified: Secondary | ICD-10-CM | POA: Diagnosis present

## 2023-03-12 DIAGNOSIS — D75839 Thrombocytosis, unspecified: Secondary | ICD-10-CM | POA: Diagnosis not present

## 2023-03-12 DIAGNOSIS — B879 Myiasis, unspecified: Secondary | ICD-10-CM | POA: Diagnosis present

## 2023-03-12 DIAGNOSIS — Z87820 Personal history of traumatic brain injury: Secondary | ICD-10-CM | POA: Diagnosis not present

## 2023-03-12 DIAGNOSIS — I959 Hypotension, unspecified: Secondary | ICD-10-CM | POA: Diagnosis not present

## 2023-03-12 DIAGNOSIS — I129 Hypertensive chronic kidney disease with stage 1 through stage 4 chronic kidney disease, or unspecified chronic kidney disease: Secondary | ICD-10-CM | POA: Diagnosis present

## 2023-03-12 DIAGNOSIS — E119 Type 2 diabetes mellitus without complications: Secondary | ICD-10-CM

## 2023-03-12 DIAGNOSIS — Z79899 Other long term (current) drug therapy: Secondary | ICD-10-CM | POA: Diagnosis not present

## 2023-03-12 DIAGNOSIS — D72829 Elevated white blood cell count, unspecified: Secondary | ICD-10-CM | POA: Diagnosis present

## 2023-03-12 DIAGNOSIS — F54 Psychological and behavioral factors associated with disorders or diseases classified elsewhere: Secondary | ICD-10-CM | POA: Diagnosis not present

## 2023-03-12 DIAGNOSIS — E1169 Type 2 diabetes mellitus with other specified complication: Secondary | ICD-10-CM | POA: Diagnosis present

## 2023-03-12 DIAGNOSIS — L97509 Non-pressure chronic ulcer of other part of unspecified foot with unspecified severity: Secondary | ICD-10-CM | POA: Diagnosis not present

## 2023-03-12 DIAGNOSIS — E785 Hyperlipidemia, unspecified: Secondary | ICD-10-CM | POA: Diagnosis present

## 2023-03-12 DIAGNOSIS — Z4781 Encounter for orthopedic aftercare following surgical amputation: Secondary | ICD-10-CM | POA: Diagnosis not present

## 2023-03-12 DIAGNOSIS — M79672 Pain in left foot: Secondary | ICD-10-CM | POA: Diagnosis present

## 2023-03-12 DIAGNOSIS — L02611 Cutaneous abscess of right foot: Secondary | ICD-10-CM | POA: Diagnosis not present

## 2023-03-12 DIAGNOSIS — I96 Gangrene, not elsewhere classified: Principal | ICD-10-CM

## 2023-03-12 DIAGNOSIS — I890 Lymphedema, not elsewhere classified: Secondary | ICD-10-CM | POA: Diagnosis present

## 2023-03-12 DIAGNOSIS — Z5941 Food insecurity: Secondary | ICD-10-CM

## 2023-03-12 DIAGNOSIS — E118 Type 2 diabetes mellitus with unspecified complications: Secondary | ICD-10-CM | POA: Diagnosis not present

## 2023-03-12 DIAGNOSIS — K5900 Constipation, unspecified: Secondary | ICD-10-CM | POA: Diagnosis not present

## 2023-03-12 DIAGNOSIS — M86272 Subacute osteomyelitis, left ankle and foot: Secondary | ICD-10-CM | POA: Diagnosis not present

## 2023-03-12 DIAGNOSIS — L97529 Non-pressure chronic ulcer of other part of left foot with unspecified severity: Secondary | ICD-10-CM | POA: Diagnosis present

## 2023-03-12 DIAGNOSIS — G8918 Other acute postprocedural pain: Secondary | ICD-10-CM | POA: Diagnosis not present

## 2023-03-12 DIAGNOSIS — H5789 Other specified disorders of eye and adnexa: Secondary | ICD-10-CM | POA: Diagnosis not present

## 2023-03-12 LAB — CBC WITH DIFFERENTIAL/PLATELET
Abs Immature Granulocytes: 0.29 10*3/uL — ABNORMAL HIGH (ref 0.00–0.07)
Basophils Absolute: 0.1 10*3/uL (ref 0.0–0.1)
Basophils Relative: 0 %
Eosinophils Absolute: 0.1 10*3/uL (ref 0.0–0.5)
Eosinophils Relative: 1 %
HCT: 32.3 % — ABNORMAL LOW (ref 39.0–52.0)
Hemoglobin: 10.2 g/dL — ABNORMAL LOW (ref 13.0–17.0)
Immature Granulocytes: 1 %
Lymphocytes Relative: 7 %
Lymphs Abs: 1.5 10*3/uL (ref 0.7–4.0)
MCH: 31.6 pg (ref 26.0–34.0)
MCHC: 31.6 g/dL (ref 30.0–36.0)
MCV: 100 fL (ref 80.0–100.0)
Monocytes Absolute: 2 10*3/uL — ABNORMAL HIGH (ref 0.1–1.0)
Monocytes Relative: 9 %
Neutro Abs: 17.2 10*3/uL — ABNORMAL HIGH (ref 1.7–7.7)
Neutrophils Relative %: 82 %
Platelets: 355 10*3/uL (ref 150–400)
RBC: 3.23 MIL/uL — ABNORMAL LOW (ref 4.22–5.81)
RDW: 14.3 % (ref 11.5–15.5)
WBC: 21.1 10*3/uL — ABNORMAL HIGH (ref 4.0–10.5)
nRBC: 0 % (ref 0.0–0.2)

## 2023-03-12 LAB — RAPID URINE DRUG SCREEN, HOSP PERFORMED
Amphetamines: NOT DETECTED
Barbiturates: NOT DETECTED
Benzodiazepines: NOT DETECTED
Cocaine: NOT DETECTED
Opiates: NOT DETECTED
Tetrahydrocannabinol: NOT DETECTED

## 2023-03-12 LAB — COMPREHENSIVE METABOLIC PANEL
ALT: 13 U/L (ref 0–44)
AST: 19 U/L (ref 15–41)
Albumin: 1.9 g/dL — ABNORMAL LOW (ref 3.5–5.0)
Alkaline Phosphatase: 112 U/L (ref 38–126)
Anion gap: 11 (ref 5–15)
BUN: 30 mg/dL — ABNORMAL HIGH (ref 6–20)
CO2: 16 mmol/L — ABNORMAL LOW (ref 22–32)
Calcium: 8.7 mg/dL — ABNORMAL LOW (ref 8.9–10.3)
Chloride: 104 mmol/L (ref 98–111)
Creatinine, Ser: 1.27 mg/dL — ABNORMAL HIGH (ref 0.61–1.24)
GFR, Estimated: >60 mL/min (ref >60)
Glucose, Bld: 120 mg/dL — ABNORMAL HIGH (ref 70–99)
Potassium: 4.3 mmol/L (ref 3.5–5.1)
Sodium: 131 mmol/L — ABNORMAL LOW (ref 135–145)
Total Bilirubin: 0.6 mg/dL (ref 0.3–1.2)
Total Protein: 7.7 g/dL (ref 6.5–8.1)

## 2023-03-12 LAB — GLUCOSE, CAPILLARY
Glucose-Capillary: 151 mg/dL — ABNORMAL HIGH (ref 70–99)
Glucose-Capillary: 148 mg/dL — ABNORMAL HIGH (ref 70–99)
Glucose-Capillary: 122 mg/dL — ABNORMAL HIGH (ref 70–99)

## 2023-03-12 LAB — LACTIC ACID, PLASMA
Lactic Acid, Venous: 1.2 mmol/L (ref 0.5–1.9)
Lactic Acid, Venous: 1 mmol/L (ref 0.5–1.9)

## 2023-03-12 MED ORDER — ONDANSETRON HCL 4 MG/2ML IJ SOLN: 4.0000 mg | Freq: Four times a day (QID) | INTRAMUSCULAR | Status: DC | PRN

## 2023-03-12 MED ORDER — PIPERACILLIN-TAZOBACTAM 3.375 G IVPB
3.3750 g | Freq: Three times a day (TID) | INTRAVENOUS | Status: DC
  Administered 2023-03-12 – 2023-03-16 (×11): 3.375 g via INTRAVENOUS
  Filled 2023-03-12 (×12): qty 50

## 2023-03-12 MED ORDER — ALBUTEROL SULFATE (2.5 MG/3ML) 0.083% IN NEBU: 2.5000 mg | INHALATION_SOLUTION | Freq: Four times a day (QID) | RESPIRATORY_TRACT | Status: DC | PRN

## 2023-03-12 MED ORDER — INSULIN ASPART 100 UNIT/ML IJ SOLN
0.0000 [IU] | Freq: Three times a day (TID) | INTRAMUSCULAR | Status: DC
  Administered 2023-03-12: 1 [IU] via SUBCUTANEOUS
  Administered 2023-03-12: 2 [IU] via SUBCUTANEOUS
  Administered 2023-03-13: 5 [IU] via SUBCUTANEOUS
  Administered 2023-03-13 – 2023-03-14 (×2): 1 [IU] via SUBCUTANEOUS
  Administered 2023-03-15: 2 [IU] via SUBCUTANEOUS
  Administered 2023-03-16 – 2023-03-18 (×5): 1 [IU] via SUBCUTANEOUS

## 2023-03-12 MED ORDER — OXYCODONE HCL 5 MG PO TABS
5.0000 mg | ORAL_TABLET | ORAL | Status: DC | PRN
  Administered 2023-03-12: 5 mg via ORAL
  Filled 2023-03-12 (×2): qty 1

## 2023-03-12 MED ORDER — ASPIRIN 81 MG PO TBEC
81.0000 mg | DELAYED_RELEASE_TABLET | Freq: Every day | ORAL | Status: DC
  Administered 2023-03-12 – 2023-03-18 (×7): 81 mg via ORAL
  Filled 2023-03-12 (×7): qty 1

## 2023-03-12 MED ORDER — ACETAMINOPHEN 325 MG PO TABS: 650.0000 mg | ORAL_TABLET | Freq: Four times a day (QID) | ORAL | Status: DC | PRN

## 2023-03-12 MED ORDER — PNEUMOCOCCAL 20-VAL CONJ VACC 0.5 ML IM SUSY
0.5000 mL | PREFILLED_SYRINGE | INTRAMUSCULAR | Status: DC
  Filled 2023-03-12: qty 0.5

## 2023-03-12 MED ORDER — ACETAMINOPHEN 650 MG RE SUPP: 650.0000 mg | Freq: Four times a day (QID) | RECTAL | Status: DC | PRN

## 2023-03-12 MED ORDER — VANCOMYCIN HCL 2000 MG/400ML IV SOLN
2000.0000 mg | Freq: Once | INTRAVENOUS | Status: AC
  Administered 2023-03-12: 2000 mg via INTRAVENOUS
  Filled 2023-03-12: qty 400

## 2023-03-12 MED ORDER — VANCOMYCIN HCL IN DEXTROSE 1-5 GM/200ML-% IV SOLN
1000.0000 mg | Freq: Two times a day (BID) | INTRAVENOUS | Status: DC
  Administered 2023-03-12 – 2023-03-16 (×8): 1000 mg via INTRAVENOUS
  Filled 2023-03-12 (×8): qty 200

## 2023-03-12 MED ORDER — LACTATED RINGERS IV SOLN: INTRAVENOUS | Status: DC

## 2023-03-12 MED ORDER — ATORVASTATIN CALCIUM 40 MG PO TABS
40.0000 mg | ORAL_TABLET | Freq: Every day | ORAL | Status: DC
  Administered 2023-03-12 – 2023-03-17 (×6): 40 mg via ORAL
  Filled 2023-03-12 (×6): qty 1

## 2023-03-12 MED ORDER — INSULIN ASPART 100 UNIT/ML IJ SOLN: 0.0000 [IU] | Freq: Every day | INTRAMUSCULAR | Status: DC

## 2023-03-12 MED ORDER — PIPERACILLIN-TAZOBACTAM 3.375 G IVPB 30 MIN
3.3750 g | Freq: Once | INTRAVENOUS | Status: AC
  Administered 2023-03-12: 3.375 g via INTRAVENOUS
  Filled 2023-03-12: qty 50

## 2023-03-12 MED ORDER — CLOPIDOGREL BISULFATE 75 MG PO TABS
75.0000 mg | ORAL_TABLET | Freq: Every day | ORAL | Status: DC
  Administered 2023-03-12 – 2023-03-18 (×7): 75 mg via ORAL
  Filled 2023-03-12 (×7): qty 1

## 2023-03-12 MED ORDER — ENOXAPARIN SODIUM 40 MG/0.4ML IJ SOSY
40.0000 mg | PREFILLED_SYRINGE | INTRAMUSCULAR | Status: DC
  Administered 2023-03-12 – 2023-03-18 (×6): 40 mg via SUBCUTANEOUS
  Filled 2023-03-12 (×7): qty 0.4

## 2023-03-12 MED ORDER — PIPERACILLIN-TAZOBACTAM 3.375 G IVPB 30 MIN: 3.3750 g | Freq: Three times a day (TID) | INTRAVENOUS | Status: DC

## 2023-03-12 MED ORDER — GABAPENTIN 100 MG PO CAPS
100.0000 mg | ORAL_CAPSULE | Freq: Three times a day (TID) | ORAL | Status: DC
  Administered 2023-03-12 – 2023-03-18 (×19): 100 mg via ORAL
  Filled 2023-03-12 (×19): qty 1

## 2023-03-12 MED ORDER — ONDANSETRON HCL 4 MG PO TABS: 4.0000 mg | ORAL_TABLET | Freq: Four times a day (QID) | ORAL | Status: DC | PRN

## 2023-03-12 MED ORDER — INSULIN GLARGINE-YFGN 100 UNIT/ML ~~LOC~~ SOLN
5.0000 [IU] | Freq: Every day | SUBCUTANEOUS | Status: DC
  Administered 2023-03-12 – 2023-03-17 (×6): 5 [IU] via SUBCUTANEOUS
  Filled 2023-03-12 (×7): qty 0.05

## 2023-03-12 MED ORDER — SODIUM CHLORIDE 0.9% FLUSH
3.0000 mL | Freq: Two times a day (BID) | INTRAVENOUS | Status: DC
  Administered 2023-03-13 – 2023-03-18 (×10): 3 mL via INTRAVENOUS

## 2023-03-12 NOTE — TOC Initial Note (Signed)
Transition of Care [facility]) - Initial/Assessment Note    Patient Details  Name: Jesse [name] MRN: [id number] Date of Birth: April 27, 1961  [facility]) CM/SW Contact:    Tom-Johnson, Hershal Coria, RN Phone Number: 03/12/2023, 3:32 PM  Clinical Narrative:                  CM spoke with patient at bedside about needs for post hospital transition. Patient was discharge from the ED a day ago for same complaint, left AMA. Returned and admitted for Left foot gangrene. On IV abx. Patient was admitted couple weeks ago and outpatient wound care scheduled with the wound care clinic at [facility]. Ortho consulted for possible amputation.  Patient states he is from home alone. Does not have children, has five siblings and all are out of state except his sister whom is not able to assist at this time.  Has a walker, w/c and shower seat at home. States he is able to transfer from his bed to wheelchair and then able to do ADL's and chores.  Uses Medicaid transportation for appointments. PCP is Grayce Sessions, NP  and uses [facility] on [address] and [address] No TOC needs noted at this time. CM will continue to follow as patient progresses with care towards discharge.         Patient Goals and CMS Choice            Expected Discharge Plan and Services                                              Prior Living Arrangements/Services                       Activities of Daily Living Home Assistive Devices/Equipment: None ADL Screening (condition at time of admission) Patient's cognitive ability adequate to safely complete daily activities?: No Is the patient deaf or have difficulty hearing?: No Does the patient have difficulty seeing, even when wearing glasses/contacts?: No Does the patient have difficulty concentrating, remembering, or making decisions?: No Patient able to express need for assistance with ADLs?: Yes Does the patient have difficulty  dressing or bathing?: Yes Independently performs ADLs?: No Communication: Independent Dressing (OT): Needs assistance Is this a change from baseline?: Change from baseline, expected to last >3 days Grooming: Independent (set up) Feeding: Independent (needs set up) Bathing: Needs assistance Is this a change from baseline?: Change from baseline, expected to last >3 days Toileting: Needs assistance Is this a change from baseline?: Change from baseline, expected to last >3days In/Out Bed: Needs assistance Is this a change from baseline?: Change from baseline, expected to last >3 days Walks in Home: Needs assistance Is this a change from baseline?: Change from baseline, expected to last >3 days Does the patient have difficulty walking or climbing stairs?: Yes Weakness of Legs: None Weakness of Arms/Hands: None  Permission Sought/Granted                  Emotional Assessment              Admission diagnosis:  Gangrene (HCC) [I96] Gangrene of right foot [facility]) [I96] Patient Active Problem List   Diagnosis Date Noted   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   Lymphedema 03/04/2023   Eye injury 11/21/2022   Hyperkalemia 07/10/2022  Chronic painful diabetic neuropathy (HCC) 07/10/2022   Metabolic acidosis, normal anion gap (NAG) 07/10/2022   Hypomagnesemia 07/10/2022   Charcot's arthropathy 06/28/2022   Leukocytosis    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)    Severe protein-calorie malnutrition (HCC)    Diabetic foot infection (HCC) 06/22/2022   Sepsis (HCC) 06/22/2022   Cellulitis in diabetic foot (HCC) 04/17/2021   Osteomyelitis (HCC) 04/14/2021   HTN (hypertension) 04/08/2021   HLD (hyperlipidemia) 04/08/2021   Alcohol abuse    DM (diabetes mellitus) (HCC) 01/27/2014   AKI (acute kidney injury) (HCC) 01/27/2014   PCP:  Grayce Sessions, NP Pharmacy:   [facility] [phone] - [place], [place] -  [address] AT [facility] [address] [place] [address] Phone: [phone] Fax: [phone]     Social Determinants of Health (SDOH) Social History: SDOH Screenings   Food Insecurity: Food Insecurity Present (03/12/2023)  Housing: High Risk (03/12/2023)  Transportation Needs: Unmet Transportation Needs (03/12/2023)  Utilities: Not At Risk (03/12/2023)  Depression (PHQ2-9): Medium Risk (10/28/2022)  Tobacco Use: Low Risk  (03/07/2023)   SDOH Interventions:     Readmission Risk Interventions    03/05/2023    3:13 PM  Readmission Risk Prevention Plan  Transportation Screening Complete  PCP or Specialist Appt within 5-7 Days Complete  Home Care Screening Complete  Medication Review (RN CM) Complete

## 2023-03-12 NOTE — ED Triage Notes (Signed)
Pt reports being here and leaving prior to full treatment completion yesterday, said he was hoping to go home and his foot would get better, but it has had increased drainage and pain to the left foot. Denies fevers.

## 2023-03-12 NOTE — ED Triage Notes (Signed)
Pt arrives from home with c/o left foot 8/10 pain and bleeding. EMS noted increased bloody drainage when pt stood. En route, 116/78, hr 82, 96%, cbg 111.

## 2023-03-12 NOTE — ED Provider Notes (Signed)
[facility] Emergency Department Provider Note MRN:  [id number]  Arrival date & time: 03/12/23     Chief Complaint   Foot Pain   History of Present Illness   Jesse [name] is a 62 y.o. year-old male with a history of diabetes presenting to the ED with chief complaint of foot pain.  Continued foot pain and bloody discharge from the left heel.  Recently admitted and told that he needs an amputation.  He was very hesitant and decided to decline the amputation and was discharged.  Given that the symptoms continued and the drainage continued, he had time to think about it and now he is ready for surgery.  Review of Systems  A thorough review of systems was obtained and all systems are negative except as noted in the HPI and PMH.   Patient's Health History    Past Medical History:  Diagnosis Date   AKI (acute kidney injury) (HCC) 01/27/2014   Alcohol abuse    Anasarca    Bilateral leg edema 02/07/2021   Cellulitis    Cellulitis and abscess 01/26/2014   Cellulitis of right foot 04/17/2021   Dental abscess 01/26/2014   Diabetes mellitus type 2 in obese    Diabetic foot infection (HCC) 04/08/2021   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 2 diabetes mellitus, limited to breakdown of skin (HCC)    Facial cellulitis 01/26/2014   HTN (hypertension)    Hypokalemia 01/28/2014   Infected dental carries 01/26/2014   Leukocytosis 01/27/2014   Osteomyelitis (HCC) 04/14/2021   Right foot infection     Past Surgical History:  Procedure Laterality Date   ABDOMINAL AORTOGRAM W/LOWER EXTREMITY N/A 03/06/2023   Procedure: ABDOMINAL AORTOGRAM W/LOWER EXTREMITY;  Surgeon: Cephus Shelling, MD;  [facility];  Service: Cardiovascular;  Laterality: N/A;   I & D EXTREMITY Right 04/18/2021   Procedure: IRRIGATION AND DEBRIDEMENT OF FOOT;  Surgeon: Nadara Mustard, MD;  Location: [facility] OR;  Service: Orthopedics;  Laterality: Right;   I & D EXTREMITY Right  04/20/2021   Procedure: EXCISIONAL DEBRIDEMENT RIGHT FOOT, APPLICATION OF SKIN GRAFT;  Surgeon: Nadara Mustard, MD;  Location: [facility] OR;  Service: Orthopedics;  Laterality: Right;   PERIPHERAL VASCULAR INTERVENTION  03/06/2023   Procedure: PERIPHERAL VASCULAR INTERVENTION;  Surgeon: Cephus Shelling, MD;  [facility];  Service: Cardiovascular;;    No family history on file.  Social History   Socioeconomic History   Marital status: Single    Spouse name: Not on file   Number of children: Not on file   Years of education: 10th grade   Highest education level: Not on file  Occupational History   Occupation: Curator  Tobacco Use   Smoking status: Never   Smokeless tobacco: Never  Vaping Use   Vaping Use: Never used  Substance and Sexual Activity   Alcohol use: Not Currently    Alcohol/week: 2.0 standard drinks of alcohol    Types: 2 Cans of beer per week    Comment: Occasional   Drug use: No   Sexual activity: Not Currently    Partners: Female  Other Topics Concern   Not on file  Social History Narrative   Not on file   Social Determinants of Health   Financial Resource Strain: Not on file  Food Insecurity: Food Insecurity Present (03/03/2023)   Hunger Vital Sign    Worried About Running Out of Food in the  Last Year: Often true    Ran Out of Food in the Last Year: Often true  Transportation Needs: No Transportation Needs (03/03/2023)   PRAPARE - Administrator, Civil Service (Medical): No    Lack of Transportation (Non-Medical): No  Physical Activity: Not on file  Stress: Not on file  Social Connections: Not on file  Intimate Partner Violence: Not At Risk (03/03/2023)   Humiliation, Afraid, Rape, and Kick questionnaire    Fear of Current or Ex-Partner: No    Emotionally Abused: No    Physically Abused: No    Sexually Abused: No     Physical Exam   Vitals:   03/12/23 0200 03/12/23 0230  BP: (!) 121/59 122/62  Pulse: 83 81  Resp: 20 (!)  22  Temp:    SpO2: 99% 98%    CONSTITUTIONAL: Chronically ill-appearing, NAD NEURO/PSYCH:  Alert and oriented x 3, no focal deficits EYES:  eyes equal and reactive ENT/NECK:  no LAD, no JVD CARDIO: Regular rate, well-perfused, normal S1 and S2 PULM:  CTAB no wheezing or rhonchi GI/GU:  non-distended, non-tender MSK/SPINE:  No gross deformities, no edema SKIN: Foul-smelling deep ulcer to the left heel with devitalized black tissue   *Additional and/or pertinent findings included in MDM below  Diagnostic and Interventional Summary    EKG Interpretation  Date/Time:    Ventricular Rate:    PR Interval:    QRS Duration:   QT Interval:    QTC Calculation:   R Axis:     Text Interpretation:         Labs Reviewed  COMPREHENSIVE METABOLIC PANEL - Abnormal; Notable for the following components:      Result Value   Sodium 131 (*)    CO2 16 (*)    Glucose, Bld 120 (*)    BUN 30 (*)    Creatinine, Ser 1.27 (*)    Calcium 8.7 (*)    Albumin 1.9 (*)    All other components within normal limits  CBC WITH DIFFERENTIAL/PLATELET - Abnormal; Notable for the following components:   WBC 21.1 (*)    RBC 3.23 (*)    Hemoglobin 10.2 (*)    HCT 32.3 (*)    Neutro Abs 17.2 (*)    Monocytes Absolute 2.0 (*)    Abs Immature Granulocytes 0.29 (*)    All other components within normal limits  LACTIC ACID, PLASMA  LACTIC ACID, PLASMA    No orders to display    Medications - No data to display   Procedures  /  Critical Care .Critical Care  Performed by: Sabas Sous, MD Authorized by: Sabas Sous, MD   Critical care provider statement:    Critical care time (minutes):  35   Critical care was necessary to treat or prevent imminent or life-threatening deterioration of the following conditions: Gangrene.   Critical care was time spent personally by me on the following activities:  Development of treatment plan with patient or surrogate, discussions with consultants, evaluation  of patient's response to treatment, examination of patient, ordering and review of laboratory studies, ordering and review of radiographic studies, ordering and performing treatments and interventions, pulse oximetry, re-evaluation of patient's condition and review of old charts   ED Course and Medical Decision Making  Initial Impression and Ddx Gangrene, was here few hours ago.  Seems to be having a hard time coming to terms with the need for amputation.  On my evaluation he seems to have  reached acceptance.  Ready for surgery.  Vital signs reassuring.  Had a leukocytosis and a mild acidosis couple hours ago when he was here.  Initiating antibiotics, will admit to medicine.  Past medical/surgical history that increases complexity of ED encounter: Diabetes  Interpretation of Diagnostics I personally reviewed the laboratory assessment and my interpretation is as follows: Leukocytosis, acidosis    Patient Reassessment and Ultimate Disposition/Management     Admit  Patient management required discussion with the following services or consulting groups:  Hospitalist Service  Complexity of Problems Addressed Acute illness or injury that poses threat of life of bodily function  Additional Data Reviewed and Analyzed Further history obtained from: Prior ED visit notes and Prior labs/imaging results  Additional Factors Impacting ED Encounter Risk Consideration of hospitalization  Elmer Sow. Pilar Plate, MD [facility] Emergency Medicine [facility] [email or web address]  Final Clinical Impressions(s) / ED Diagnoses     ICD-10-CM   1. Gangrene (HCC)  Naomi.Beaver       ED Discharge Orders     None        Discharge Instructions Discussed with and Provided to Patient:   Discharge Instructions   None      Sabas Sous, MD 03/12/23 [id number]

## 2023-03-12 NOTE — ED Notes (Signed)
Pt presents to room via wc from triage c/o chronic left foot pain and drainage. Pt has gangrenous left foot was admitted on 03/03/23 and treated for sepsis due to wound and dc/d home yesterday. Pt in ER x 2 yesterday for same refused to stay refuses to have amputation procedure that is needed for treatment. Both feet have diabetic ulcers left worse then right . Left is draining foul smelling orange /green possible some blood from wound. Pt is hypotensive afebrile unable to walk on feet . Pt diabetic has neuropathy pulses weak cap refill greater then 3 seconds in both feet

## 2023-03-12 NOTE — H&P (Addendum)
History and Physical    Patient: Jesse [name]:[id number] DOB: 07-Nov-1961 DOA: 03/12/2023 DOS: the patient was seen and examined on 03/12/2023 PCP: Grayce Sessions, NP  Patient coming from: Home  Chief Complaint:  Chief Complaint  Patient presents with   Foot Pain   HPI: Jesse [name] is a 62 y.o. 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    Patient had just recently been hospitalized 4/22-4/29 due to sepsis thought secondary to bilateral lower extremities wounds and was diagnosed with osteomyelitis of the left heel.  Initial imaging studies had noted concern for osteomyelitis on the right heel, but MRI did not reveal this.  He had undergone revascularization with Dr. Chestine Spore during his hospital stay orthopedics had been consulted and patient was recommended for below-knee amputation by Dr. Lajoyce Corners, but at that time did not want to consider it as he did not know how he would care for himself.  After getting discharged home patient noticed that he had not been taking any other medications as previously prescribed.  Patient had come back to the emergency department on 4/29 and 4/30 due to symptoms and ultimately left AGAINST MEDICAL ADVICE.  They given him supplies and things to change his dressings at home, but after getting home reported that the foot started to bleed and he was not able to manage it.  Denied having any significant fevers or chills.  At this time he is coming to terms with needing the amputation.  In emergency department patient was noted to be mildly tachypneic with all other vital signs maintained.  Labs significant for WBC 21.1, hemoglobin 10.2, sodium 131, CO2 16, BUN 30, creatinine 1.27, anion gap 11, albumin 1.9, and lactic acid 1.2.  Urinalysis noted  small hemoglobin, small leukocytes, no bacteria seen, 0-5 RBCs/hpf, and 0-5 WBCs. UDS was negative.  Patient had been started on empiric antibiotics of vancomycin and cefepime.  Orthopedics was consulted for need of BKA.  Review of Systems: As mentioned in the history of present illness. All other systems reviewed and are negative. Past Medical History:  Diagnosis Date   AKI (acute kidney injury) (HCC) 01/27/2014   Alcohol abuse    Anasarca    Bilateral leg edema 02/07/2021   Cellulitis    Cellulitis and abscess 01/26/2014   Cellulitis of right foot 04/17/2021   Dental abscess 01/26/2014   Diabetes mellitus type 2 in obese    Diabetic foot infection (HCC) 04/08/2021   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 2 diabetes mellitus, limited to breakdown of skin (HCC)    Facial cellulitis 01/26/2014   HTN (hypertension)    Hypokalemia 01/28/2014   Infected dental carries 01/26/2014   Leukocytosis 01/27/2014   Osteomyelitis (HCC) 04/14/2021   Right foot infection    Past Surgical History:  Procedure Laterality Date   ABDOMINAL AORTOGRAM W/LOWER EXTREMITY N/A 03/06/2023   Procedure: ABDOMINAL AORTOGRAM W/LOWER EXTREMITY;  Surgeon: Cephus Shelling, MD;  [facility];  Service: Cardiovascular;  Laterality: N/A;   I & D EXTREMITY Right 04/18/2021   Procedure: IRRIGATION AND DEBRIDEMENT OF FOOT;  Surgeon: Nadara Mustard, MD;  Location: [facility] OR;  Service: Orthopedics;  Laterality: Right;   I & D EXTREMITY Right 04/20/2021   Procedure: EXCISIONAL DEBRIDEMENT RIGHT FOOT, APPLICATION OF  SKIN GRAFT;  Surgeon: Nadara Mustard, MD;  Location: [facility] OR;  Service: Orthopedics;  Laterality: Right;   PERIPHERAL VASCULAR INTERVENTION  03/06/2023   Procedure: PERIPHERAL VASCULAR INTERVENTION;  Surgeon: Cephus Shelling, MD;  [facility];  Service: Cardiovascular;;   Social History:  reports that he has never smoked. He has never used smokeless tobacco. He reports that he  does not currently use alcohol after a past usage of about 2.0 standard drinks of alcohol per week. He reports that he does not use drugs.  No Known Allergies  No family history on file.  Prior to Admission medications   Medication Sig Start Date End Date Taking? Authorizing Provider  insulin glargine (LANTUS) 100 UNIT/ML Solostar Pen INJECT 10 UNITS INTO THE SKIN DAILY. 11/20/22  Yes Grayce Sessions, NP  acetaminophen (TYLENOL) 325 MG tablet Take 2 tablets (650 mg total) by mouth every 4 (four) hours as needed for headache or mild pain. Patient not taking: Reported on 03/12/2023 03/10/23   Rhetta Mura, MD  amoxicillin-clavulanate (AUGMENTIN) 875-125 MG tablet Take 2 tablets by mouth 2 (two) times daily. Patient not taking: Reported on 03/12/2023 03/10/23   Rhetta Mura, MD  aspirin EC 81 MG tablet Take 1 tablet (81 mg total) by mouth daily. Swallow whole. Patient not taking: Reported on 03/12/2023 03/10/23   Rhetta Mura, MD  atorvastatin (LIPITOR) 40 MG tablet Take 1 tablet (40 mg total) by mouth at bedtime. Patient not taking: Reported on 03/12/2023 03/10/23   Rhetta Mura, MD  clopidogrel (PLAVIX) 75 MG tablet Take 1 tablet (75 mg total) by mouth daily. Patient not taking: Reported on 03/12/2023 03/10/23 03/09/24  Rhetta Mura, MD  dorzolamide-timolol (COSOPT) 2-0.5 % ophthalmic solution Place 1 drop into the right eye 2 (two) times daily. Patient not taking: Reported on 03/12/2023 11/23/22   Adam Phenix, PA-C  gabapentin (NEURONTIN) 100 MG capsule Take 1 capsule (100 mg total) by mouth 3 (three) times daily. Patient not taking: Reported on 03/12/2023 11/20/22 03/03/23  Grayce Sessions, NP  guaiFENesin (ROBITUSSIN) 100 MG/5ML liquid Take 5 mLs by mouth every 4 (four) hours as needed for cough or to loosen phlegm. Patient not taking: Reported on 03/12/2023 03/10/23   Rhetta Mura, MD  Insulin Pen Needle 32G X 4 MM MISC Use as directed with insulin pen  07/03/22   Rai, Ripudeep K, MD  Nystatin (GERHARDT'S BUTT CREAM) CREA Apply 1 Application topically 3 (three) times daily. Apply to inner thighs and buttocks liberally Patient not taking: Reported on 11/21/2022 07/03/22   Rai, Delene Ruffini, MD  oxyCODONE (OXY IR/ROXICODONE) 5 MG immediate release tablet Take 1 tablet (5 mg total) by mouth every 4 (four) hours as needed for moderate pain. Patient not taking: Reported on 03/12/2023 03/10/23   Rhetta Mura, MD  glipiZIDE (GLUCOTROL) 5 MG tablet Take 1 tablet (5 mg total) by mouth daily before breakfast. Patient not taking: Reported on 01/14/2016 01/31/14 01/14/16  Christiane Ha, MD    Physical Exam: Vitals:   03/12/23 0500 03/12/23 0545 03/12/23 0547 03/12/23 0600  BP: 99/62 (!) 109/49  (!) 101/52  Pulse: 74 78  77  Resp: (!) 23 20  (!) 24  Temp:   98.4 F (36.9 C)   TempSrc:   Oral   SpO2: 98% 99%  98%    Constitutional: Elderly male currently in no acute distress Eyes: PERRL, lids and conjunctivae normal ENMT: Mucous membranes are moist.   Neck: normal, supple,  Respiratory: clear to auscultation bilaterally, no wheezing, no crackles. Normal respiratory effort. No accessory muscle use.  Cardiovascular: Regular rate and rhythm, no murmurs / rubs / gallops.  Lymphedema.  2+ pedal pulses. No carotid bruits.  Abdomen: no tenderness, no masses palpated. No hepatosplenomegaly. Bowel sounds positive.  Musculoskeletal:   No joint deformity appreciated. Skin: Chronic venous stasis changes of the lower extremities with bilateral heel ulcers worse on the left with bloody drainage.  Patient with other shallow ulcers noted to bilateral lower extremities. Neurologic: CN 2-12 grossly intact.   Strength 5/5 in all 4.  Psychiatric: Normal judgment and insight. Alert and oriented x 3. Normal mood.   Data Reviewed:   EKG reveals sinus rhythm at 79 bpm. Reviewed labs, imaging, and pertinent records as noted above in HPI.  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  Patient presented back to hospital due to drainage and bleeding from his left foot.  He just recently been hospitalized and noted to have concern for gangrene of the left foot as well as osteomyelitis on MRI from 4/27. Blood cultures from 4/29 negative at 2 days.  He initially declined amputation, but is not ready to consider it at this time. -Admit to a medical telemetry bed -Continue to follow-up prior blood cultures. -Continue empiric antibiotics of vancomycin and Zosyn -/OT consulted -Orthopedics consulted for BKA with tentative plan for procedure on 5/4  Diabetic ulcer of right foot Present on admission.  Patient had been evaluated due to concern for osteomyelitis of the right foot as well, but showed no signs of septic arthritis or osteomyelitis. -Prevalon boots  -Wound care  Leukocytosis WBC elevated at 21.1.  Suspect secondary to above.   -Continue antibiotics as noted above -Recheck CBC tomorrow morning  Renal insufficiency Creatinine just mildly elevated at 1.27 with BUN 30.  Baseline creatinine noted to be around 1.1.  The elevated BUN to creatinine ratio suggest prerenal cause of symptoms. -Gentle IV fluids  Diabetes mellitus type 2, with long-term use of insulin Last hemoglobin A1c 6.9 on 11/21/2022.  Patient had previously been prescribed Lantus 10 units -Hypoglycemic protocols -Pharmacy substitution of Semglee reduced to 5 units nightly -CBGs before every meal with sensitive SSI  Peripheral vascular disease Patient had just underwent right anterior tibial angioplasty by Dr. Chestine Spore on 4/25. -Resume aspirin, Plavix, and statin  Hyponatremia Acute.  Sodium 131.  Possibly hypovolemic in nature. -Continue to monitor  Normocytic anemia Stable.  Hemoglobin 10.2 which appears similar to prior. -Continue to monitor  Hyperlipidemia -Resume atorvastatin.  DVT prophylaxis: Lovenox Advance Care Planning:   Code  Status: Full Code    Consults: Orthopedics  Family Communication: Patient's Sister [name] updated over the phone  Severity of Illness: The appropriate patient status for this patient is INPATIENT. Inpatient status is judged to be reasonable and necessary in order to provide the required intensity of service to ensure the patient's safety. The patient's presenting symptoms, physical exam findings, and initial radiographic and laboratory data in the context of their chronic comorbidities is felt to place them at high risk for further clinical deterioration. Furthermore, it is not anticipated that the patient will be medically stable for discharge from the hospital within 2 midnights of admission.   * I certify that at the point of admission it is my clinical judgment that the patient will require inpatient hospital care spanning beyond 2 midnights from the point of admission due to high intensity of service, high risk  for further deterioration and high frequency of surveillance required.*  Author: Clydie Braun, MD 03/12/2023 7:08 AM  For on call review [email or web address].

## 2023-03-12 NOTE — ED Notes (Signed)
Pt resting with eyes closed; respirations spontaneous, even, unlabored 

## 2023-03-12 NOTE — Plan of Care (Signed)

## 2023-03-12 NOTE — Consult Note (Signed)
Reason for Consult:Left foot osteo Referring Physician: Madelyn Flavors Time called: 0830 Time at bedside: 1012   Jesse [name] is an 62 y.o. male.  HPI: Jesse [name] is well-known to the orthopedic service. He was just admitted last week with left foot osteo and BKA was recommended. He changed his mind as last minute and decided to go home to care for it himself. He quickly came to realization he needed the surgery and returned. Nothing has changed in his clinical picture.  Past Medical History:  Diagnosis Date   AKI (acute kidney injury) (HCC) 01/27/2014   Alcohol abuse    Anasarca    Bilateral leg edema 02/07/2021   Cellulitis    Cellulitis and abscess 01/26/2014   Cellulitis of right foot 04/17/2021   Dental abscess 01/26/2014   Diabetes mellitus type 2 in obese    Diabetic foot infection (HCC) 04/08/2021   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 2 diabetes mellitus, limited to breakdown of skin (HCC)    Facial cellulitis 01/26/2014   HTN (hypertension)    Hypokalemia 01/28/2014   Infected dental carries 01/26/2014   Leukocytosis 01/27/2014   Osteomyelitis (HCC) 04/14/2021   Right foot infection     Past Surgical History:  Procedure Laterality Date   ABDOMINAL AORTOGRAM W/LOWER EXTREMITY N/A 03/06/2023   Procedure: ABDOMINAL AORTOGRAM W/LOWER EXTREMITY;  Surgeon: Cephus Shelling, MD;  [facility];  Service: Cardiovascular;  Laterality: N/A;   I & D EXTREMITY Right 04/18/2021   Procedure: IRRIGATION AND DEBRIDEMENT OF FOOT;  Surgeon: Nadara Mustard, MD;  Location: [facility] OR;  Service: Orthopedics;  Laterality: Right;   I & D EXTREMITY Right 04/20/2021   Procedure: EXCISIONAL DEBRIDEMENT RIGHT FOOT, APPLICATION OF SKIN GRAFT;  Surgeon: Nadara Mustard, MD;  Location: [facility] OR;  Service: Orthopedics;  Laterality: Right;   PERIPHERAL VASCULAR INTERVENTION  03/06/2023   Procedure: PERIPHERAL VASCULAR INTERVENTION;  Surgeon: Cephus Shelling, MD;  Location: [facility]  INV[facility] LAB;  Service: Cardiovascular;;    No family history on file.  Social History:  reports that he has never smoked. He has never used smokeless tobacco. He reports that he does not currently use alcohol after a past usage of about 2.0 standard drinks of alcohol per week. He reports that he does not use drugs.  Allergies: No Known Allergies  Medications: I have reviewed the patient's current medications.  Results for orders placed or performed during the hospital encounter of 03/12/23 (from the past 48 hour(s))  Lactic acid, plasma     Status: None   Collection Time: 03/12/23  1:22 AM  Result Value Ref Range   Lactic Acid, Venous 1.2 0.5 - 1.9 mmol/L    Comment: Performed at [facility] Lab, [address], [place], [place] [address]  Comprehensive metabolic panel     Status: Abnormal   Collection Time: 03/12/23  1:22 AM  Result Value Ref Range   Sodium 131 (L) 135 - 145 mmol/L   Potassium 4.3 3.5 - 5.1 mmol/L   Chloride 104 98 - 111 mmol/L   CO2 16 (L) 22 - 32 mmol/L   Glucose, Bld 120 (H) 70 - 99 mg/dL    Comment: Glucose reference range applies only to samples taken after fasting for at least 8 hours.   BUN 30 (H) 6 - 20 mg/dL   Creatinine, Ser 6.04 (H) 0.61 - 1.24 mg/dL   Calcium 8.7 (L) 8.9 - 10.3 mg/dL  Total Protein 7.7 6.5 - 8.1 g/dL   Albumin 1.9 (L) 3.5 - 5.0 g/dL   AST 19 15 - 41 U/L   ALT 13 0 - 44 U/L   Alkaline Phosphatase 112 38 - 126 U/L   Total Bilirubin 0.6 0.3 - 1.2 mg/dL   GFR, Estimated >16 >10 mL/min    Comment: (NOTE) Calculated using the CKD-EPI Creatinine Equation (2021)    Anion gap 11 5 - 15    Comment: Performed at [facility] Lab, [address], [place], [place] [address]  CBC with Differential     Status: Abnormal   Collection Time: 03/12/23  1:22 AM  Result Value Ref Range   WBC 21.1 (H) 4.0 - 10.5 K/uL   RBC 3.23 (L) 4.22 - 5.81 MIL/uL   Hemoglobin 10.2 (L) 13.0 - 17.0 g/dL   HCT 40.9 (L) 81.1 - 91.4 %   MCV 100.0 80.0 -  100.0 fL   MCH 31.6 26.0 - 34.0 pg   MCHC 31.6 30.0 - 36.0 g/dL   RDW 78.2 95.6 - 21.3 %   Platelets 355 150 - 400 K/uL   nRBC 0.0 0.0 - 0.2 %   Neutrophils Relative % 82 %   Neutro Abs 17.2 (H) 1.7 - 7.7 K/uL   Lymphocytes Relative 7 %   Lymphs Abs 1.5 0.7 - 4.0 K/uL   Monocytes Relative 9 %   Monocytes Absolute 2.0 (H) 0.1 - 1.0 K/uL   Eosinophils Relative 1 %   Eosinophils Absolute 0.1 0.0 - 0.5 K/uL   Basophils Relative 0 %   Basophils Absolute 0.1 0.0 - 0.1 K/uL   Immature Granulocytes 1 %   Abs Immature Granulocytes 0.29 (H) 0.00 - 0.07 K/uL    Comment: Performed at [facility] Lab, [address] [place], [place] [address]  Rapid urine drug screen (hospital performed)     Status: None   Collection Time: 03/12/23  5:50 AM  Result Value Ref Range   Opiates NONE DETECTED NONE DETECTED   Cocaine NONE DETECTED NONE DETECTED   Benzodiazepines NONE DETECTED NONE DETECTED   Amphetamines NONE DETECTED NONE DETECTED   Tetrahydrocannabinol NONE DETECTED NONE DETECTED   Barbiturates NONE DETECTED NONE DETECTED    Comment: (NOTE) DRUG SCREEN FOR MEDICAL PURPOSES ONLY.  IF CONFIRMATION IS NEEDED FOR ANY PURPOSE, NOTIFY LAB WITHIN 5 DAYS.  LOWEST DETECTABLE LIMITS FOR URINE DRUG SCREEN Drug Class                     Cutoff (ng/mL) Amphetamine and metabolites    1000 Barbiturate and metabolites    200 Benzodiazepine                 200 Opiates and metabolites        300 Cocaine and metabolites        300 THC                            50 Performed at [facility] Lab, [address], [place], [place] [address]     DG Chest 1 View  Result Date: 03/10/2023 CLINICAL DATA:  Sepsis EXAM: CHEST  1 VIEW COMPARISON:  11/21/2022 FINDINGS: Low lung volumes. Chronic elevation of right diaphragm. No acute airspace disease or effusion. Stable cardiomediastinal silhouette with aortic atherosclerosis. No pneumothorax IMPRESSION: No active disease. Low lung volumes with chronic  elevation of right diaphragm. Electronically Signed  By: Jasmine Pang M.D.   On: 03/10/2023 22:52   DG Foot Complete Left  Result Date: 03/10/2023 CLINICAL DATA:  Gangrene EXAM: LEFT FOOT - COMPLETE 3+ VIEW COMPARISON:  MRI 03/08/2023, radiograph 03/03/2023 FINDINGS: Large heel ulcer, appears increased compared to the prior radiographs. Increased gas in the heel soft tissues and tracking posterior to the ankle suspicious for necrotic infection. Suspected gas within the calcaneus. Lucency and erosive change at the plantar posterior calcaneus concerning for osteomyelitis with redemonstrated fracture lucency extending towards the posterior subtalar joint. Diffuse soft tissue edema, progressive. No change in the erosion at the tuft of first distal phalanx. Similar small erosions at the first proximal phalanx and head of the first metatarsal. IMPRESSION: 1. Increased gas in the heel soft tissues and tracking posterior, medial and lateral to the ankle suspicious for necrotic infection. Increased size of large heel ulcer. 2. Suspected gas within the calcaneus. Lucency and erosive change at the calcaneus consistent with osteomyelitis. Fracture deformity as before extending obliquely from the posterior plantar calcaneus towards the posterior subtalar joint. 3. Worsened soft tissue edema. 4. Similar erosive change at the tuft of first distal phalanx. Similar small erosions at the head of the first metatarsal and proximal phalanx. Electronically Signed   By: Jasmine Pang M.D.   On: 03/10/2023 22:52    Review of Systems  HENT:  Negative for ear discharge, ear pain, hearing loss and tinnitus.   Eyes:  Negative for photophobia and pain.  Respiratory:  Negative for cough and shortness of breath.   Cardiovascular:  Negative for chest pain.  Gastrointestinal:  Negative for abdominal pain, nausea and vomiting.  Genitourinary:  Negative for dysuria, flank pain, frequency and urgency.  Musculoskeletal:  Positive for  arthralgias (Left foot). Negative for back pain, myalgias and neck pain.  Neurological:  Negative for dizziness and headaches.  Hematological:  Does not bruise/bleed easily.  Psychiatric/Behavioral:  The patient is not nervous/anxious.    Blood pressure 110/69, pulse 74, temperature 98.4 F (36.9 C), temperature source Oral, resp. rate 17, height 6' (1.829 m), weight 96.3 kg, SpO2 98 %. Physical Exam Constitutional:      General: He is not in acute distress.    Appearance: He is well-developed. He is not diaphoretic.  HENT:     Head: Normocephalic and atraumatic.  Eyes:     General: No scleral icterus.       Right eye: No discharge.        Left eye: No discharge.     Conjunctiva/sclera: Conjunctivae normal.  Cardiovascular:     Rate and Rhythm: Normal rate and regular rhythm.  Pulmonary:     Effort: Pulmonary effort is normal. No respiratory distress.  Musculoskeletal:     Cervical back: Normal range of motion.  Feet:     Comments: Left foot -- Unchanged since admission 1 week ago Skin:    General: Skin is warm and dry.  Neurological:     Mental Status: He is alert.  Psychiatric:        Mood and Affect: Mood normal.        Behavior: Behavior normal.     Assessment/Plan: Left foot osteo -- Will plan BKA Saturday by Dr. Lajoyce Corners. Please keep NPO after MN Friday.    Freeman Caldron, PA-C Orthopedic Surgery [phone] 03/12/2023, 10:37 AM

## 2023-03-12 NOTE — ED Notes (Signed)
Provider at bedside

## 2023-03-12 NOTE — Progress Notes (Signed)
Pharmacy Antibiotic Note  Jesse [name] is a 62 y.o. male admitted on 03/12/2023 with  osteomyelitis .  Pharmacy has been consulted for vancomycin dosing. Also resumed on Zosyn per MD (previously receiving 4/22-4/29 during recent admission). SCr stable 1.27 on presentation.  Patient received vancomycin 2g IV x 1 in the ER this morning.  Plan: Vancomycin 1g IV q12h. Goal AUC 400-550. Expected AUC: 460 SCr used: 1.27 Zosyn 3.375g IV q8h (4h infusion) per MD Monitor clinical progress, c/s, renal function F/u de-escalation plan/LOT, vancomycin levels as indicated      Temp (24hrs), Avg:98.2 F (36.8 C), Min:98 F (36.7 C), Max:98.4 F (36.9 C)  Recent Labs  Lab 03/08/23 0230 03/09/23 0142 03/10/23 0206 03/10/23 2230 03/12/23 0122  WBC 23.5* 24.0* 23.0* 25.1* 21.1*  CREATININE 1.11 1.21 1.12 1.38* 1.27*  LATICACIDVEN  --   --   --  1.3 1.2    Estimated Creatinine Clearance: 75.6 mL/min (A) (by C-G formula based on SCr of 1.27 mg/dL (H)).    No Known Allergies  Antimicrobials this admission: 5/1 vancomycin >>  5/1 zosyn >>   Dose adjustments this admission:  Microbiology results: 4/29 BCx (previous admission): ngtd   Leia Alf, PharmD, BCPS Please check AMION for all [facility] contact numbers Clinical Pharmacist 03/12/2023 8:46 AM

## 2023-03-12 NOTE — Progress Notes (Signed)
New Admission Note:   Arrival Method: stretcher Mental Orientation: alert and oriented x4 Telemetry: 25m10 Assessment: Completed Skin: see assessment IV:RAC Pain: 8/10 Tubes: none Safety Measures: Safety Fall Prevention Plan has been given, discussed and signed Admission: Completed [facility] Orientation: Patient has been orientated to the room, unit and staff.  Family: None  Orders have been reviewed and implemented. Will continue to monitor the patient. Call light has been placed within reach and bed alarm has been activated.   Stacie Glaze LPN [facility] Phone: [phone]

## 2023-03-12 NOTE — ED Notes (Signed)
ED TO INPATIENT HANDOFF REPORT  ED Nurse Name and Phone #: [phone]  S Name/Age/Gender Jesse [name] 62 y.o. male Room/Bed: 039C/039C  Code Status   Code Status: Prior  Home/SNF/Other Home Patient oriented to: self, place, time, and situation Is this baseline? Yes   Triage Complete: Triage complete  Chief Complaint Gangrene of right foot (HCC) [I96]  Triage Note Pt arrives from home with c/o left foot 8/10 pain and bleeding. EMS noted increased bloody drainage when pt stood. En route, 116/78, hr 82, 96%, cbg 111.   Pt reports being here and leaving prior to full treatment completion yesterday, said he was hoping to go home and his foot would get better, but it has had increased drainage and pain to the left foot. Denies fevers.    Allergies No Known Allergies  Level of Care/Admitting Diagnosis ED Disposition     ED Disposition  Admit   Condition  --   Comment  Hospital Area: [facility] [[id number]]  Level of Care: Telemetry Medical [104]  May admit patient to [name] or [facility] if equivalent level of care is available:: No  Covid Evaluation: Asymptomatic - no recent exposure (last 10 days) testing not required  Diagnosis: Gangrene of right foot [facility]) [[id number]]  Admitting Physician: Clydie Braun [[id number]]  Attending Physician: Clydie Braun [[id number]]  Certification:: I certify this patient will need inpatient services for at least 2 midnights  Estimated Length of Stay: 3          B Medical/Surgery History Past Medical History:  Diagnosis Date   AKI (acute kidney injury) (HCC) 01/27/2014   Alcohol abuse    Anasarca    Bilateral leg edema 02/07/2021   Cellulitis    Cellulitis and abscess 01/26/2014   Cellulitis of right foot 04/17/2021   Dental abscess 01/26/2014   Diabetes mellitus type 2 in obese    Diabetic foot infection (HCC) 04/08/2021   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 2 diabetes mellitus, limited to  breakdown of skin (HCC)    Facial cellulitis 01/26/2014   HTN (hypertension)    Hypokalemia 01/28/2014   Infected dental carries 01/26/2014   Leukocytosis 01/27/2014   Osteomyelitis (HCC) 04/14/2021   Right foot infection    Past Surgical History:  Procedure Laterality Date   ABDOMINAL AORTOGRAM W/LOWER EXTREMITY N/A 03/06/2023   Procedure: ABDOMINAL AORTOGRAM W/LOWER EXTREMITY;  Surgeon: Cephus Shelling, MD;  [facility]: [facility];  Service: Cardiovascular;  Laterality: N/A;   I & D EXTREMITY Right 04/18/2021   Procedure: IRRIGATION AND DEBRIDEMENT OF FOOT;  Surgeon: Nadara Mustard, MD;  Location: [facility] OR;  Service: Orthopedics;  Laterality: Right;   I & D EXTREMITY Right 04/20/2021   Procedure: EXCISIONAL DEBRIDEMENT RIGHT FOOT, APPLICATION OF SKIN GRAFT;  Surgeon: Nadara Mustard, MD;  Location: [facility] OR;  Service: Orthopedics;  Laterality: Right;   PERIPHERAL VASCULAR INTERVENTION  03/06/2023   Procedure: PERIPHERAL VASCULAR INTERVENTION;  Surgeon: Cephus Shelling, MD;  [facility]: [facility];  Service: Cardiovascular;;     A IV Location/Drains/Wounds Patient Lines/Drains/Airways Status     Active Line/Drains/Airways     Name Placement date Placement time Site Days   Peripheral IV 03/12/23 20 G Anterior;Distal;Right;Upper Arm 03/12/23  0330  Arm  less than 1   Negative Pressure Wound Therapy Heel Right 04/20/21  1552  --  691   Wound / Incision (Open or Dehisced) 04/14/21 Diabetic ulcer Foot  Right 04/14/21  --  Foot  697   Wound / Incision (Open or Dehisced) 06/23/22 (IAD) Incontinence Associated Dermatitis Buttocks Left 06/23/22  0413  Buttocks  262   Wound / Incision (Open or Dehisced) 06/23/22 Toe (Comment  which one) Anterior;Left 06/23/22  --  Toe (Comment  which one)  262   Wound / Incision (Open or Dehisced) 06/23/22 Venous stasis ulcer Heel Left black 06/23/22  1900  Heel  262   Wound / Incision (Open or Dehisced) 06/26/22 Skin tear Pretibial Right red pink  06/26/22  1500  Pretibial  259   Wound / Incision (Open or Dehisced) 03/04/23 Heel Right large, open heel wound, smaller puncture site below 03/04/23  0700  Heel  8            Intake/Output Last 24 hours No intake or output data in the 24 hours ending 03/12/23 4098  Labs/Imaging Results for orders placed or performed during the hospital encounter of 03/12/23 (from the past 48 hour(s))  Lactic acid, plasma     Status: None   Collection Time: 03/12/23  1:22 AM  Result Value Ref Range   Lactic Acid, Venous 1.2 0.5 - 1.9 mmol/L    Comment: Performed at [facility] Lab, [address] [place], [place] [address]  Comprehensive metabolic panel     Status: Abnormal   Collection Time: 03/12/23  1:22 AM  Result Value Ref Range   Sodium 131 (L) 135 - 145 mmol/L   Potassium 4.3 3.5 - 5.1 mmol/L   Chloride 104 98 - 111 mmol/L   CO2 16 (L) 22 - 32 mmol/L   Glucose, Bld 120 (H) 70 - 99 mg/dL    Comment: Glucose reference range applies only to samples taken after fasting for at least 8 hours.   BUN 30 (H) 6 - 20 mg/dL   Creatinine, Ser 7.82 (H) 0.61 - 1.24 mg/dL   Calcium 8.7 (L) 8.9 - 10.3 mg/dL   Total Protein 7.7 6.5 - 8.1 g/dL   Albumin 1.9 (L) 3.5 - 5.0 g/dL   AST 19 15 - 41 U/L   ALT 13 0 - 44 U/L   Alkaline Phosphatase 112 38 - 126 U/L   Total Bilirubin 0.6 0.3 - 1.2 mg/dL   GFR, Estimated >95 >62 mL/min    Comment: (NOTE) Calculated using the CKD-EPI Creatinine Equation (2021)    Anion gap 11 5 - 15    Comment: Performed at [facility] Lab, [address], [place], [place] [address]  CBC with Differential     Status: Abnormal   Collection Time: 03/12/23  1:22 AM  Result Value Ref Range   WBC 21.1 (H) 4.0 - 10.5 K/uL   RBC 3.23 (L) 4.22 - 5.81 MIL/uL   Hemoglobin 10.2 (L) 13.0 - 17.0 g/dL   HCT 57.8 (L) 46.9 - 62.9 %   MCV 100.0 80.0 - 100.0 fL   MCH 31.6 26.0 - 34.0 pg   MCHC 31.6 30.0 - 36.0 g/dL   RDW 52.8 41.3 - 24.4 %   Platelets 355 150 - 400 K/uL   nRBC 0.0  0.0 - 0.2 %   Neutrophils Relative % 82 %   Neutro Abs 17.2 (H) 1.7 - 7.7 K/uL   Lymphocytes Relative 7 %   Lymphs Abs 1.5 0.7 - 4.0 K/uL   Monocytes Relative 9 %   Monocytes Absolute 2.0 (H) 0.1 - 1.0 K/uL   Eosinophils Relative 1 %   Eosinophils Absolute  0.1 0.0 - 0.5 K/uL   Basophils Relative 0 %   Basophils Absolute 0.1 0.0 - 0.1 K/uL   Immature Granulocytes 1 %   Abs Immature Granulocytes 0.29 (H) 0.00 - 0.07 K/uL    Comment: Performed at [facility] Lab, [address] [place], [place] [address]  Rapid urine drug screen (hospital performed)     Status: None   Collection Time: 03/12/23  5:50 AM  Result Value Ref Range   Opiates NONE DETECTED NONE DETECTED   Cocaine NONE DETECTED NONE DETECTED   Benzodiazepines NONE DETECTED NONE DETECTED   Amphetamines NONE DETECTED NONE DETECTED   Tetrahydrocannabinol NONE DETECTED NONE DETECTED   Barbiturates NONE DETECTED NONE DETECTED    Comment: (NOTE) DRUG SCREEN FOR MEDICAL PURPOSES ONLY.  IF CONFIRMATION IS NEEDED FOR ANY PURPOSE, NOTIFY LAB WITHIN 5 DAYS.  LOWEST DETECTABLE LIMITS FOR URINE DRUG SCREEN Drug Class                     Cutoff (ng/mL) Amphetamine and metabolites    1000 Barbiturate and metabolites    200 Benzodiazepine                 200 Opiates and metabolites        300 Cocaine and metabolites        300 THC                            50 Performed at [facility] Lab, [address] [place], [place] [address]    DG Chest 1 View  Result Date: 03/10/2023 CLINICAL DATA:  Sepsis EXAM: CHEST  1 VIEW COMPARISON:  11/21/2022 FINDINGS: Low lung volumes. Chronic elevation of right diaphragm. No acute airspace disease or effusion. Stable cardiomediastinal silhouette with aortic atherosclerosis. No pneumothorax IMPRESSION: No active disease. Low lung volumes with chronic elevation of right diaphragm. Electronically Signed   By: Jasmine Pang M.D.   On: 03/10/2023 22:52   DG Foot Complete Left  Result Date:  03/10/2023 CLINICAL DATA:  Gangrene EXAM: LEFT FOOT - COMPLETE 3+ VIEW COMPARISON:  MRI 03/08/2023, radiograph 03/03/2023 FINDINGS: Large heel ulcer, appears increased compared to the prior radiographs. Increased gas in the heel soft tissues and tracking posterior to the ankle suspicious for necrotic infection. Suspected gas within the calcaneus. Lucency and erosive change at the plantar posterior calcaneus concerning for osteomyelitis with redemonstrated fracture lucency extending towards the posterior subtalar joint. Diffuse soft tissue edema, progressive. No change in the erosion at the tuft of first distal phalanx. Similar small erosions at the first proximal phalanx and head of the first metatarsal. IMPRESSION: 1. Increased gas in the heel soft tissues and tracking posterior, medial and lateral to the ankle suspicious for necrotic infection. Increased size of large heel ulcer. 2. Suspected gas within the calcaneus. Lucency and erosive change at the calcaneus consistent with osteomyelitis. Fracture deformity as before extending obliquely from the posterior plantar calcaneus towards the posterior subtalar joint. 3. Worsened soft tissue edema. 4. Similar erosive change at the tuft of first distal phalanx. Similar small erosions at the head of the first metatarsal and proximal phalanx. Electronically Signed   By: Jasmine Pang M.D.   On: 03/10/2023 22:52    Pending Labs Unresulted Labs (From admission, onward)     Start     Ordered   03/12/23 0122  Lactic acid, plasma  Now then every 2 hours,  R (with STAT occurrences)      03/12/23 0121            Vitals/Pain Today's Vitals   03/12/23 0512 03/12/23 0545 03/12/23 0547 03/12/23 0600  BP:  (!) 109/49  (!) 101/52  Pulse:  78  77  Resp:  20  (!) 24  Temp:   98.4 F (36.9 C)   TempSrc:   Oral   SpO2:  99%  98%  PainSc: Asleep       Isolation Precautions No active isolations  Medications Medications  lactated ringers infusion (  Intravenous New Bag/Given 03/12/23 0611)  piperacillin-tazobactam (ZOSYN) IVPB 3.375 g (has no administration in time range)  piperacillin-tazobactam (ZOSYN) IVPB 3.375 g (0 g Intravenous Stopped 03/12/23 0403)  vancomycin (VANCOREADY) IVPB 2000 mg/400 mL (0 mg Intravenous Stopped 03/12/23 7425)    Mobility non-ambulatory     Focused Assessments skin Heel wounds   R Recommendations: See Admitting Provider Note  Report given to:   Additional Notes:

## 2023-03-13 DIAGNOSIS — I96 Gangrene, not elsewhere classified: Secondary | ICD-10-CM | POA: Diagnosis not present

## 2023-03-13 LAB — GLUCOSE, CAPILLARY
Glucose-Capillary: 146 mg/dL — ABNORMAL HIGH (ref 70–99)
Glucose-Capillary: 277 mg/dL — ABNORMAL HIGH (ref 70–99)
Glucose-Capillary: 123 mg/dL — ABNORMAL HIGH (ref 70–99)
Glucose-Capillary: 147 mg/dL — ABNORMAL HIGH (ref 70–99)

## 2023-03-13 LAB — BASIC METABOLIC PANEL
Anion gap: 6 (ref 5–15)
BUN: 21 mg/dL — ABNORMAL HIGH (ref 6–20)
CO2: 17 mmol/L — ABNORMAL LOW (ref 22–32)
Calcium: 8.1 mg/dL — ABNORMAL LOW (ref 8.9–10.3)
Chloride: 109 mmol/L (ref 98–111)
Creatinine, Ser: 1.13 mg/dL (ref 0.61–1.24)
GFR, Estimated: >60 mL/min (ref >60)
Glucose, Bld: 136 mg/dL — ABNORMAL HIGH (ref 70–99)
Potassium: 3.9 mmol/L (ref 3.5–5.1)
Sodium: 132 mmol/L — ABNORMAL LOW (ref 135–145)

## 2023-03-13 LAB — CBC
HCT: 27.6 % — ABNORMAL LOW (ref 39.0–52.0)
Hemoglobin: 9.2 g/dL — ABNORMAL LOW (ref 13.0–17.0)
MCH: 32.6 pg (ref 26.0–34.0)
MCHC: 33.3 g/dL (ref 30.0–36.0)
MCV: 97.9 fL (ref 80.0–100.0)
Platelets: 304 10*3/uL (ref 150–400)
RBC: 2.82 MIL/uL — ABNORMAL LOW (ref 4.22–5.81)
RDW: 14.3 % (ref 11.5–15.5)
WBC: 13.6 10*3/uL — ABNORMAL HIGH (ref 4.0–10.5)
nRBC: 0 % (ref 0.0–0.2)

## 2023-03-13 LAB — CULTURE, BLOOD (ROUTINE X 2): Culture: NO GROWTH

## 2023-03-13 MED ORDER — ADULT MULTIVITAMIN W/MINERALS CH
1.0000 | ORAL_TABLET | Freq: Every day | ORAL | Status: DC
  Administered 2023-03-14 – 2023-03-18 (×5): 1 via ORAL
  Filled 2023-03-13 (×5): qty 1

## 2023-03-13 MED ORDER — JUVEN PO PACK
1.0000 | PACK | Freq: Two times a day (BID) | ORAL | Status: DC
  Administered 2023-03-14 (×2): 1 via ORAL
  Filled 2023-03-13 (×3): qty 1

## 2023-03-13 MED ORDER — GLUCERNA SHAKE PO LIQD
237.0000 mL | Freq: Three times a day (TID) | ORAL | Status: DC
  Administered 2023-03-13 – 2023-03-18 (×15): 237 mL via ORAL
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
## 2023-03-13 NOTE — Progress Notes (Signed)
Initial Nutrition Assessment  DOCUMENTATION CODES:   Not applicable  INTERVENTION:  - Add Glucerna Shake po TID, each supplement provides 220 kcal and 10 grams of protein   - Add -1 packet Juven BID, each packet provides 95 calories, 2.5 grams of protein (collagen), and 9.8 grams of carbohydrate (3 grams sugar); also contains 7 grams of L-arginine and L-glutamine, 300 mg vitamin C, 15 mg vitamin E, 1.2 mcg vitamin B-12, 9.5 mg zinc, 200 mg calcium, and 1.5 g  Calcium Beta-hydroxy-Beta-methylbutyrate to support wound healing  - Add MVI q day.   NUTRITION DIAGNOSIS:   Increased nutrient needs related to wound healing as evidenced by estimated needs (plan for surgery.).  GOAL:   Patient will meet greater than or equal to 90% of their needs  MONITOR:   PO intake, Supplement acceptance  REASON FOR ASSESSMENT:   Malnutrition Screening Tool    ASSESSMENT:   62 y.o. male admits related to foot pain. PMH includes: AKI, alcohol abuse, T2DM, HTN. Pt is currently receiving medical management related to gangrene of left foot.  Meds reviewed:  lipitor, sliding scale insulin, semglee (5 units). Labs reviewed: Na low, BUN elevated.   RD working remotely. Attempted to call pt's room but no answer. Per record, pt has eaten 65-75% of his meals today. Pt has had some significant wt loss per record. Pt is planned for a L BKA on 5/4. RD will add supplements. Will continue to closely monitor PO intakes and attempt to gather more nutrition hx details at f/u.    NUTRITION - FOCUSED PHYSICAL EXAM: Attempt at f/u.   Diet Order:   Diet Order             Diet NPO time specified Except for: Sips with Meds  Diet effective midnight           Diet heart healthy/carb modified Room service appropriate? Yes; Fluid consistency: Thin  Diet effective now                   EDUCATION NEEDS:   Not appropriate for education at this time  Skin:  Skin Assessment: Skin Integrity Issues: Skin Integrity  Issues:: Incisions Incisions: right heel  Last BM:  5/1 - type 7  Height:   Ht Readings from Last 1 Encounters:  03/12/23 6' (1.829 m)    Weight:   Wt Readings from Last 1 Encounters:  03/12/23 96.3 kg    Ideal Body Weight:     BMI:  Body mass index is 28.79 kg/m.  Estimated Nutritional Needs:   Kcal:  2400-2900 kcals  Protein:  120-145 gm  Fluid:  >/= 2.4 L  Bethann Humble, RD, LDN, CNSC.

## 2023-03-13 NOTE — Progress Notes (Addendum)
PT Cancellation Note  Patient Details Name: Jesse [name] MRN: [id number] DOB: 25-Nov-1960   Cancelled Treatment:    Reason Eval/Treat Not Completed: Patient declined, no reason specified. Pt not willing to mobilize with PT at this time. Appears to be internally distracted throughout encounter. States he will not be having surgery this Saturday, and "may wait until next week some time" to have the BKA. PT recently evaluated pt on 4/27 and he was transferring at a supervision level. Will continue to monitor if/when pt has BKA and will plan to evaluate him s/p surgery.   Marylynn Pearson 03/13/2023, 10:54 AM  Conni Slipper, PT, DPT Acute Rehabilitation Services Secure Chat Preferred Office: [phone]

## 2023-03-14 DIAGNOSIS — I96 Gangrene, not elsewhere classified: Secondary | ICD-10-CM | POA: Diagnosis not present

## 2023-03-14 LAB — CBC WITH DIFFERENTIAL/PLATELET
Abs Immature Granulocytes: 0.13 10*3/uL — ABNORMAL HIGH (ref 0.00–0.07)
Basophils Absolute: 0 10*3/uL (ref 0.0–0.1)
Basophils Relative: 0 %
Eosinophils Absolute: 0.2 10*3/uL (ref 0.0–0.5)
Eosinophils Relative: 2 %
HCT: 29.4 % — ABNORMAL LOW (ref 39.0–52.0)
Hemoglobin: 9.5 g/dL — ABNORMAL LOW (ref 13.0–17.0)
Immature Granulocytes: 1 %
Lymphocytes Relative: 14 %
Lymphs Abs: 1.8 10*3/uL (ref 0.7–4.0)
MCH: 31.9 pg (ref 26.0–34.0)
MCHC: 32.3 g/dL (ref 30.0–36.0)
MCV: 98.7 fL (ref 80.0–100.0)
Monocytes Absolute: 1.6 10*3/uL — ABNORMAL HIGH (ref 0.1–1.0)
Monocytes Relative: 13 %
Neutro Abs: 9 10*3/uL — ABNORMAL HIGH (ref 1.7–7.7)
Neutrophils Relative %: 70 %
Platelets: 337 10*3/uL (ref 150–400)
RBC: 2.98 MIL/uL — ABNORMAL LOW (ref 4.22–5.81)
RDW: 14.4 % (ref 11.5–15.5)
WBC: 12.7 10*3/uL — ABNORMAL HIGH (ref 4.0–10.5)
nRBC: 0 % (ref 0.0–0.2)

## 2023-03-14 LAB — GLUCOSE, CAPILLARY
Glucose-Capillary: 104 mg/dL — ABNORMAL HIGH (ref 70–99)
Glucose-Capillary: 146 mg/dL — ABNORMAL HIGH (ref 70–99)
Glucose-Capillary: 119 mg/dL — ABNORMAL HIGH (ref 70–99)
Glucose-Capillary: 141 mg/dL — ABNORMAL HIGH (ref 70–99)

## 2023-03-14 LAB — BASIC METABOLIC PANEL
Anion gap: 10 (ref 5–15)
BUN: 15 mg/dL (ref 6–20)
CO2: 18 mmol/L — ABNORMAL LOW (ref 22–32)
Calcium: 8.4 mg/dL — ABNORMAL LOW (ref 8.9–10.3)
Chloride: 105 mmol/L (ref 98–111)
Creatinine, Ser: 0.94 mg/dL (ref 0.61–1.24)
GFR, Estimated: >60 mL/min (ref >60)
Glucose, Bld: 98 mg/dL (ref 70–99)
Potassium: 4.3 mmol/L (ref 3.5–5.1)
Sodium: 133 mmol/L — ABNORMAL LOW (ref 135–145)

## 2023-03-14 LAB — C DIFFICILE QUICK SCREEN W PCR REFLEX
C Diff antigen: NEGATIVE
C Diff interpretation: NOT DETECTED
C Diff toxin: NEGATIVE

## 2023-03-14 LAB — CULTURE, BLOOD (ROUTINE X 2)
Culture: NO GROWTH
Special Requests: ADEQUATE

## 2023-03-14 LAB — SURGICAL PCR SCREEN
MRSA, PCR: NEGATIVE
Staphylococcus aureus: POSITIVE — AB

## 2023-03-14 MED ORDER — CHLORHEXIDINE GLUCONATE 4 % EX SOLN
60.0000 mL | Freq: Once | CUTANEOUS | Status: AC
  Administered 2023-03-15: 4 via TOPICAL
  Filled 2023-03-14 (×2): qty 60

## 2023-03-14 MED ORDER — CEFAZOLIN SODIUM-DEXTROSE 2-4 GM/100ML-% IV SOLN: 2.0000 g | INTRAVENOUS | Status: DC

## 2023-03-14 MED ORDER — CEFAZOLIN SODIUM-DEXTROSE 2-4 GM/100ML-% IV SOLN
2.0000 g | INTRAVENOUS | Status: AC
  Administered 2023-03-15: 2 g via INTRAVENOUS

## 2023-03-14 MED ORDER — POVIDONE-IODINE 10 % EX SWAB: 2.0000 | Freq: Once | CUTANEOUS | Status: AC

## 2023-03-14 MED ORDER — TRANEXAMIC ACID-NACL 1000-0.7 MG/100ML-% IV SOLN
1000.0000 mg | INTRAVENOUS | Status: AC
  Administered 2023-03-15: 1000 mg via INTRAVENOUS

## 2023-03-14 MED ORDER — TRANEXAMIC ACID 1000 MG/10ML IV SOLN
2000.0000 mg | INTRAVENOUS | Status: DC
  Filled 2023-03-14: qty 20

## 2023-03-14 MED ORDER — MUPIROCIN 2 % EX OINT
1.0000 | TOPICAL_OINTMENT | Freq: Two times a day (BID) | CUTANEOUS | Status: DC
  Administered 2023-03-14 – 2023-03-18 (×9): 1 via NASAL
  Filled 2023-03-14 (×2): qty 22

## 2023-03-14 MED ORDER — POVIDONE-IODINE 10 % EX SWAB: 2.0000 | Freq: Once | CUTANEOUS | Status: DC

## 2023-03-14 MED ORDER — CHLORHEXIDINE GLUCONATE 4 % EX SOLN
60.0000 mL | Freq: Once | CUTANEOUS | Status: DC
  Filled 2023-03-14: qty 60
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
## 2023-03-14 NOTE — Anesthesia Preprocedure Evaluation (Signed)
Anesthesia Evaluation  Patient identified by MRN, date of birth, ID band Patient awake    Reviewed: Allergy & Precautions, NPO status , Patient's Chart, lab work & pertinent test results  Airway Mallampati: I  TM Distance: >3 FB Neck ROM: Full    Dental  (+) Poor Dentition, Missing, Loose, Chipped,    Pulmonary neg pulmonary ROS   breath sounds clear to auscultation       Cardiovascular hypertension, Pt. on medications + Peripheral Vascular Disease   Rhythm:Regular Rate:Normal     Neuro/Psych  PSYCHIATRIC DISORDERS      negative neurological ROS     GI/Hepatic negative GI ROS, Neg liver ROS,,,  Endo/Other  diabetes, Type 2, Insulin Dependent    Renal/GU Renal InsufficiencyRenal disease     Musculoskeletal negative musculoskeletal ROS (+)    Abdominal   Peds  Hematology negative hematology ROS (+)   Anesthesia Other Findings   Reproductive/Obstetrics                             Anesthesia Physical Anesthesia Plan  ASA: 3  Anesthesia Plan: Regional   Post-op Pain Management: Regional block*   Induction: Intravenous  PONV Risk Score and Plan: 3 and Ondansetron, Dexamethasone and Midazolam  Airway Management Planned: Natural Airway  Additional Equipment: None  Intra-op Plan:   Post-operative Plan: Extubation in OR  Informed Consent: I have reviewed the patients History and Physical, chart, labs and discussed the procedure including the risks, benefits and alternatives for the proposed anesthesia with the patient or authorized representative who has indicated his/her understanding and acceptance.       Plan Discussed with: CRNA  Anesthesia Plan Comments: (Possible GA w/ LMA  EKG: normal sinus rhythm.  Echo: 1. Left ventricular ejection fraction, by estimation, is 60 to 65%. The  left ventricle has normal function. The left ventricle has no regional  wall motion  abnormalities. Left ventricular diastolic parameters were  normal.  2. Right ventricular systolic function is normal. The right ventricular  size is normal. Tricuspid regurgitation signal is inadequate for assessing  PA pressure.  3. The mitral valve is normal in structure. Trivial mitral valve  regurgitation. No evidence of mitral stenosis.  4. The aortic valve has an indeterminant number of cusps. There is  moderate calcification of the aortic valve. Aortic valve regurgitation is  not visualized. Mild to moderate aortic valve sclerosis/calcification is  present, without any evidence of aortic  stenosis.  5. The inferior vena cava is dilated in size with <50% respiratory  variability, suggesting right atrial pressure of 15 mmHg. )        Anesthesia Quick Evaluation

## 2023-03-14 NOTE — Progress Notes (Signed)
Pt's sister, [name] at [phone], updated regarding pt condition and upcoming surgical plans

## 2023-03-14 NOTE — TOC Progression Note (Signed)
Transition of Care [facility]) - Progression Note    Patient Details  Name: Jesse [name] MRN: [id number] Date of Birth: 02-06-1961  Transition of [facility]) CM/SW Contact  Tom-Johnson, Hershal Coria, RN Phone Number: 03/14/2023, 1:43 PM  Clinical Narrative:     Patient continues on IV abx for Lt foot Gangrene. Scheduled for BKA tomorrow 03/15/2023 by Gaylord Shih.  CM will continue to follow as patient progresses with care towards discharge.       Expected Discharge Plan and Services                                               Social Determinants of Health (SDOH) Interventions SDOH Screenings   Food Insecurity: Food Insecurity Present (03/12/2023)  Housing: High Risk (03/12/2023)  Transportation Needs: Unmet Transportation Needs (03/12/2023)  Utilities: Not At Risk (03/12/2023)  Depression (PHQ2-9): Medium Risk (10/28/2022)  Tobacco Use: Low Risk  (03/07/2023)    Readmission Risk Interventions    03/05/2023    3:13 PM  Readmission Risk Prevention Plan  Transportation Screening Complete  PCP or Specialist Appt within 5-7 Days Complete  Home Care Screening Complete  Medication Review (RN CM) Complete

## 2023-03-14 NOTE — Progress Notes (Signed)
Pharmacy Antibiotic Note  Jesse [name] is a 62 y.o. male admitted on 03/12/2023 with  osteomyelitis .  Pharmacy has been consulted for vancomycin dosing. Also resumed on Zosyn per MD (previously receiving 4/22-4/29 during recent admission).   SCr has come down to 0.94.  Plan: Adjust Vancomycin to 1500 mg IV q12h. Goal AUC 400-550. Expected AUC: 537.3 SCr used: 0.94 Zosyn 3.375g IV q8h (4h infusion) per MD Monitor clinical progress, c/s, renal function F/u de-escalation plan/LOT, vancomycin levels as indicated Plan for surgery on 5/4   Height: 6' (182.9 cm) Weight: 96.3 kg (212 lb 4.9 oz) (bed, no blankets) IBW/kg (Calculated) : 77.6  Temp (24hrs), Avg:98.5 F (36.9 C), Min:98.1 F (36.7 C), Max:98.8 F (37.1 C)  Recent Labs  Lab 03/10/23 0206 03/10/23 2230 03/12/23 0122 03/12/23 1037 03/13/23 0426 03/14/23 0208  WBC 23.0* 25.1* 21.1*  --  13.6* 12.7*  CREATININE 1.12 1.38* 1.27*  --  1.13 0.94  LATICACIDVEN  --  1.3 1.2 1.0  --   --      Estimated Creatinine Clearance: 100.6 mL/min (by C-G formula based on SCr of 0.94 mg/dL).    No Known Allergies  Antimicrobials this admission: 5/1 vancomycin >>  5/1 zosyn >>   Dose adjustments this admission:  Microbiology results: 4/29 BCx (previous admission): ngtd    Thank you for allowing [place] to participate in this patients care. Signe Colt, PharmD 03/14/2023 12:44 PM  **Pharmacist phone directory can be found on [email or web address] listed under [facility]**

## 2023-03-14 NOTE — Progress Notes (Signed)
OT Cancellation Note  Patient Details Name: Jesse [name] MRN: [id number] DOB: [birth year]   Cancelled Treatment:    Reason Eval/Treat Not Completed: Other (comment) (Pt scheduled for BKA 03/15/23. Pt requests skilled OT evaluation be completed after BKA. OT answered pt's general questions regarding acute skilled therapy services post BKA. OT to eval following surgery.)  [name] "[name], OTR/L, MA Acute Rehab [phone]   Lendon Colonel 03/14/2023, 3:42 PM

## 2023-03-15 ENCOUNTER — Encounter (HOSPITAL_COMMUNITY): Payer: Self-pay | Admitting: Internal Medicine

## 2023-03-15 ENCOUNTER — Encounter (HOSPITAL_COMMUNITY)
Admission: EM | Disposition: A | Discharge status: Rehabilitation Facility | Payer: Self-pay | Source: Home / Self Care | Attending: Family Medicine

## 2023-03-15 ENCOUNTER — Inpatient Hospital Stay (HOSPITAL_COMMUNITY): Payer: Medicaid Other | Admitting: Anesthesiology

## 2023-03-15 ENCOUNTER — Other Ambulatory Visit: Payer: Self-pay

## 2023-03-15 DIAGNOSIS — E1152 Type 2 diabetes mellitus with diabetic peripheral angiopathy with gangrene: Secondary | ICD-10-CM

## 2023-03-15 DIAGNOSIS — Z794 Long term (current) use of insulin: Secondary | ICD-10-CM

## 2023-03-15 DIAGNOSIS — M86272 Subacute osteomyelitis, left ankle and foot: Secondary | ICD-10-CM

## 2023-03-15 DIAGNOSIS — I96 Gangrene, not elsewhere classified: Secondary | ICD-10-CM | POA: Diagnosis not present

## 2023-03-15 DIAGNOSIS — I10 Essential (primary) hypertension: Secondary | ICD-10-CM

## 2023-03-15 HISTORY — PX: AMPUTATION: SHX166

## 2023-03-15 LAB — CULTURE, BLOOD (ROUTINE X 2): Special Requests: ADEQUATE

## 2023-03-15 LAB — GLUCOSE, CAPILLARY
Glucose-Capillary: 108 mg/dL — ABNORMAL HIGH (ref 70–99)
Glucose-Capillary: 120 mg/dL — ABNORMAL HIGH (ref 70–99)
Glucose-Capillary: 122 mg/dL — ABNORMAL HIGH (ref 70–99)
Glucose-Capillary: 157 mg/dL — ABNORMAL HIGH (ref 70–99)
Glucose-Capillary: 153 mg/dL — ABNORMAL HIGH (ref 70–99)

## 2023-03-15 SURGERY — AMPUTATION BELOW KNEE
Anesthesia: Monitor Anesthesia Care | Site: Knee | Laterality: Left

## 2023-03-15 MED ORDER — CEFAZOLIN SODIUM-DEXTROSE 2-4 GM/100ML-% IV SOLN: 2.0000 g | Freq: Three times a day (TID) | INTRAVENOUS | Status: DC

## 2023-03-15 MED ORDER — TRANEXAMIC ACID-NACL 1000-0.7 MG/100ML-% IV SOLN
INTRAVENOUS | Status: AC
  Filled 2023-03-15: qty 100

## 2023-03-15 MED ORDER — DOCUSATE SODIUM 100 MG PO CAPS
100.0000 mg | ORAL_CAPSULE | Freq: Every day | ORAL | Status: DC
  Administered 2023-03-16 – 2023-03-18 (×3): 100 mg via ORAL
  Filled 2023-03-15 (×4): qty 1

## 2023-03-15 MED ORDER — PANTOPRAZOLE SODIUM 40 MG PO TBEC
40.0000 mg | DELAYED_RELEASE_TABLET | Freq: Every day | ORAL | Status: DC
  Administered 2023-03-15 – 2023-03-18 (×4): 40 mg via ORAL
  Filled 2023-03-15 (×4): qty 1

## 2023-03-15 MED ORDER — ORAL CARE MOUTH RINSE: 15.0000 mL | Freq: Once | OROMUCOSAL | Status: AC

## 2023-03-15 MED ORDER — GUAIFENESIN-DM 100-10 MG/5ML PO SYRP: 15.0000 mL | ORAL_SOLUTION | ORAL | Status: DC | PRN

## 2023-03-15 MED ORDER — ACETAMINOPHEN 160 MG/5ML PO SOLN: 325.0000 mg | ORAL | Status: DC | PRN

## 2023-03-15 MED ORDER — OXYCODONE HCL 5 MG PO TABS: 5.0000 mg | ORAL_TABLET | Freq: Once | ORAL | Status: DC | PRN

## 2023-03-15 MED ORDER — PROPOFOL 500 MG/50ML IV EMUL
INTRAVENOUS | Status: DC | PRN
  Administered 2023-03-15: 90 ug/kg/min via INTRAVENOUS

## 2023-03-15 MED ORDER — CEFAZOLIN SODIUM-DEXTROSE 2-4 GM/100ML-% IV SOLN
INTRAVENOUS | Status: AC
  Filled 2023-03-15: qty 100

## 2023-03-15 MED ORDER — PROPOFOL 10 MG/ML IV BOLUS
INTRAVENOUS | Status: AC
  Filled 2023-03-15: qty 20

## 2023-03-15 MED ORDER — HYDROMORPHONE HCL 1 MG/ML IJ SOLN
0.5000 mg | INTRAMUSCULAR | Status: DC | PRN
  Administered 2023-03-15: 1 mg via INTRAVENOUS
  Filled 2023-03-15: qty 1

## 2023-03-15 MED ORDER — POLYETHYLENE GLYCOL 3350 17 G PO PACK: 17.0000 g | PACK | Freq: Every day | ORAL | Status: DC | PRN

## 2023-03-15 MED ORDER — MIDAZOLAM HCL 2 MG/2ML IJ SOLN
INTRAMUSCULAR | Status: DC | PRN
  Administered 2023-03-15 (×2): 1 mg via INTRAVENOUS

## 2023-03-15 MED ORDER — PHENYLEPHRINE 80 MCG/ML (10ML) SYRINGE FOR IV PUSH (FOR BLOOD PRESSURE SUPPORT)
PREFILLED_SYRINGE | INTRAVENOUS | Status: DC | PRN
  Administered 2023-03-15 (×3): 80 ug via INTRAVENOUS

## 2023-03-15 MED ORDER — OXYCODONE HCL 5 MG PO TABS
10.0000 mg | ORAL_TABLET | ORAL | Status: DC | PRN
  Administered 2023-03-16 (×2): 15 mg via ORAL
  Filled 2023-03-15 (×2): qty 3

## 2023-03-15 MED ORDER — CHLORHEXIDINE GLUCONATE 0.12 % MT SOLN
OROMUCOSAL | Status: AC
  Administered 2023-03-15: 15 mL via OROMUCOSAL
  Filled 2023-03-15: qty 15

## 2023-03-15 MED ORDER — HYDRALAZINE HCL 20 MG/ML IJ SOLN: 5.0000 mg | INTRAMUSCULAR | Status: DC | PRN

## 2023-03-15 MED ORDER — OXYCODONE HCL 5 MG/5ML PO SOLN: 5.0000 mg | Freq: Once | ORAL | Status: DC | PRN

## 2023-03-15 MED ORDER — METOPROLOL TARTRATE 5 MG/5ML IV SOLN: 2.0000 mg | INTRAVENOUS | Status: DC | PRN

## 2023-03-15 MED ORDER — ALUM & MAG HYDROXIDE-SIMETH 200-200-20 MG/5ML PO SUSP: 15.0000 mL | ORAL | Status: DC | PRN

## 2023-03-15 MED ORDER — ONDANSETRON HCL 4 MG/2ML IJ SOLN: 4.0000 mg | Freq: Four times a day (QID) | INTRAMUSCULAR | Status: DC | PRN

## 2023-03-15 MED ORDER — ZINC SULFATE 220 (50 ZN) MG PO CAPS
220.0000 mg | ORAL_CAPSULE | Freq: Every day | ORAL | Status: DC
  Administered 2023-03-15 – 2023-03-18 (×4): 220 mg via ORAL
  Filled 2023-03-15 (×4): qty 1

## 2023-03-15 MED ORDER — ACETAMINOPHEN 325 MG PO TABS: 325.0000 mg | ORAL_TABLET | ORAL | Status: DC | PRN

## 2023-03-15 MED ORDER — PROPOFOL 10 MG/ML IV BOLUS
INTRAVENOUS | Status: DC | PRN
  Administered 2023-03-15: 60 mg via INTRAVENOUS

## 2023-03-15 MED ORDER — CHLORHEXIDINE GLUCONATE 0.12 % MT SOLN: 15.0000 mL | Freq: Once | OROMUCOSAL | Status: AC

## 2023-03-15 MED ORDER — MIDAZOLAM HCL 2 MG/2ML IJ SOLN
INTRAMUSCULAR | Status: AC
  Filled 2023-03-15: qty 2

## 2023-03-15 MED ORDER — VITAMIN C 500 MG PO TABS
1000.0000 mg | ORAL_TABLET | Freq: Every day | ORAL | Status: DC
  Administered 2023-03-15 – 2023-03-18 (×4): 1000 mg via ORAL
  Filled 2023-03-15 (×4): qty 2

## 2023-03-15 MED ORDER — JUVEN PO PACK
1.0000 | PACK | Freq: Two times a day (BID) | ORAL | Status: DC
  Administered 2023-03-15 – 2023-03-18 (×7): 1 via ORAL
  Filled 2023-03-15 (×7): qty 1

## 2023-03-15 MED ORDER — MAGNESIUM SULFATE 2 GM/50ML IV SOLN: 2.0000 g | Freq: Every day | INTRAVENOUS | Status: DC | PRN

## 2023-03-15 MED ORDER — ACETAMINOPHEN 325 MG PO TABS: 325.0000 mg | ORAL_TABLET | Freq: Four times a day (QID) | ORAL | Status: DC | PRN

## 2023-03-15 MED ORDER — FENTANYL CITRATE (PF) 100 MCG/2ML IJ SOLN: 25.0000 ug | INTRAMUSCULAR | Status: DC | PRN

## 2023-03-15 MED ORDER — FENTANYL CITRATE (PF) 250 MCG/5ML IJ SOLN
INTRAMUSCULAR | Status: DC | PRN
  Administered 2023-03-15: 50 ug via INTRAVENOUS

## 2023-03-15 MED ORDER — LACTATED RINGERS IV SOLN: INTRAVENOUS | Status: DC | PRN

## 2023-03-15 MED ORDER — LACTATED RINGERS IV SOLN: INTRAVENOUS | Status: DC

## 2023-03-15 MED ORDER — OXYCODONE HCL 5 MG PO TABS
5.0000 mg | ORAL_TABLET | ORAL | Status: DC | PRN
  Administered 2023-03-15: 5 mg via ORAL
  Administered 2023-03-15: 10 mg via ORAL
  Administered 2023-03-15: 5 mg via ORAL
  Administered 2023-03-17: 10 mg via ORAL
  Filled 2023-03-15: qty 2
  Filled 2023-03-15: qty 1
  Filled 2023-03-15: qty 2

## 2023-03-15 MED ORDER — 0.9 % SODIUM CHLORIDE (POUR BTL) OPTIME
TOPICAL | Status: DC | PRN
  Administered 2023-03-15: 1000 mL

## 2023-03-15 MED ORDER — PHENOL 1.4 % MT LIQD: 1.0000 | OROMUCOSAL | Status: DC | PRN

## 2023-03-15 MED ORDER — PROMETHAZINE HCL 25 MG/ML IJ SOLN: 6.2500 mg | INTRAMUSCULAR | Status: DC | PRN

## 2023-03-15 MED ORDER — LABETALOL HCL 5 MG/ML IV SOLN: 10.0000 mg | INTRAVENOUS | Status: DC | PRN

## 2023-03-15 MED ORDER — AMISULPRIDE (ANTIEMETIC) 5 MG/2ML IV SOLN: 10.0000 mg | Freq: Once | INTRAVENOUS | Status: DC | PRN

## 2023-03-15 MED ORDER — LIDOCAINE 2% (20 MG/ML) 5 ML SYRINGE
INTRAMUSCULAR | Status: DC | PRN
  Administered 2023-03-15: 50 mg via INTRAVENOUS

## 2023-03-15 MED ORDER — CHLORHEXIDINE GLUCONATE 0.12 % MT SOLN
OROMUCOSAL | Status: AC
  Filled 2023-03-15: qty 45

## 2023-03-15 MED ORDER — POTASSIUM CHLORIDE CRYS ER 20 MEQ PO TBCR: 20.0000 meq | EXTENDED_RELEASE_TABLET | Freq: Every day | ORAL | Status: DC | PRN

## 2023-03-15 MED ORDER — BISACODYL 5 MG PO TBEC: 5.0000 mg | DELAYED_RELEASE_TABLET | Freq: Every day | ORAL | Status: DC | PRN

## 2023-03-15 MED ORDER — FENTANYL CITRATE (PF) 250 MCG/5ML IJ SOLN
INTRAMUSCULAR | Status: AC
  Filled 2023-03-15: qty 5

## 2023-03-15 MED ORDER — SODIUM CHLORIDE 0.9 % IV SOLN: INTRAVENOUS | Status: DC

## 2023-03-15 MED ORDER — ACETAMINOPHEN 10 MG/ML IV SOLN: 1000.0000 mg | Freq: Once | INTRAVENOUS | Status: DC | PRN

## 2023-03-15 MED ORDER — CHLORHEXIDINE GLUCONATE CLOTH 2 % EX PADS
6.0000 | MEDICATED_PAD | Freq: Every day | CUTANEOUS | Status: DC
  Administered 2023-03-15 – 2023-03-17 (×3): 6 via TOPICAL

## 2023-03-15 MED ORDER — MAGNESIUM CITRATE PO SOLN
1.0000 | Freq: Once | ORAL | Status: DC | PRN
  Filled 2023-03-15: qty 296

## 2023-03-15 SURGICAL SUPPLY — 45 items
BAG COUNTER SPONGE SURGICOUNT (BAG) IMPLANT
BAG SPNG CNTER NS LX DISP (BAG)
BIT DRILL 3.2XOCPTL (BIT) ×1 IMPLANT
BIT DRL 3.2XOCPTL (BIT)
BLADE SAW RECIP 87.9 MT (BLADE) ×1 IMPLANT
BLADE SURG 21 STRL SS (BLADE) ×1 IMPLANT
BNDG CMPR 5X6 CHSV STRCH STRL (GAUZE/BANDAGES/DRESSINGS) ×1
BNDG COHESIVE 6X5 TAN ST LF (GAUZE/BANDAGES/DRESSINGS) IMPLANT
CANISTER WOUND CARE 500ML ATS (WOUND CARE) ×1 IMPLANT
COVER SURGICAL LIGHT HANDLE (MISCELLANEOUS) ×1 IMPLANT
CUFF TOURN SGL QUICK 34 (TOURNIQUET CUFF) ×1
CUFF TRNQT CYL 34X4.125X (TOURNIQUET CUFF) ×1 IMPLANT
DRAPE DERMATAC (DRAPES) IMPLANT
DRAPE INCISE IOBAN 66X45 STRL (DRAPES) ×1 IMPLANT
DRAPE U-SHAPE 47X51 STRL (DRAPES) ×1 IMPLANT
DRESSING PREVENA PLUS CUSTOM (GAUZE/BANDAGES/DRESSINGS) ×1 IMPLANT
DRESSING RESTOR ADAPTIFORM 49 (GAUZE/BANDAGES/DRESSINGS) IMPLANT
DRILL BIT (BIT)
DRSG PREVENA PLUS CUSTOM (GAUZE/BANDAGES/DRESSINGS) ×1
DRSG RESTOR ADAPTIFORM 49 (GAUZE/BANDAGES/DRESSINGS) ×1
DURAPREP 26ML APPLICATOR (WOUND CARE) ×1 IMPLANT
ELECT REM PT RETURN 9FT ADLT (ELECTROSURGICAL) ×1
ELECTRODE REM PT RTRN 9FT ADLT (ELECTROSURGICAL) ×1 IMPLANT
GLOVE BIOGEL PI IND STRL 9 (GLOVE) ×1 IMPLANT
GLOVE SURG ORTHO 9.0 STRL STRW (GLOVE) ×1 IMPLANT
GOWN STRL REUS W/ TWL XL LVL3 (GOWN DISPOSABLE) ×2 IMPLANT
GOWN STRL REUS W/TWL XL LVL3 (GOWN DISPOSABLE) ×2
GRAFT SKIN WND MICRO 38 (Tissue) IMPLANT
KIT BASIN OR (CUSTOM PROCEDURE TRAY) ×1 IMPLANT
KIT TURNOVER KIT B (KITS) ×1 IMPLANT
MANIFOLD NEPTUNE II (INSTRUMENTS) ×1 IMPLANT
NS IRRIG 1000ML POUR BTL (IV SOLUTION) ×1 IMPLANT
PACK ORTHO EXTREMITY (CUSTOM PROCEDURE TRAY) ×1 IMPLANT
PAD ARMBOARD 7.5X6 YLW CONV (MISCELLANEOUS) ×1 IMPLANT
PREVENA RESTOR ARTHOFORM 46X30 (CANNISTER) ×1 IMPLANT
SPONGE T-LAP 18X18 ~~LOC~~+RFID (SPONGE) IMPLANT
STAPLER VISISTAT 35W (STAPLE) IMPLANT
STOCKINETTE IMPERVIOUS LG (DRAPES) ×1 IMPLANT
SUT ETHILON 2 0 PSLX (SUTURE) IMPLANT
SUT SILK 2 0 (SUTURE) ×1
SUT SILK 2-0 18XBRD TIE 12 (SUTURE) ×1 IMPLANT
SUT VIC AB 1 CTX 27 (SUTURE) ×2 IMPLANT
TOWEL GREEN STERILE (TOWEL DISPOSABLE) ×1 IMPLANT
TUBE CONNECTING 12X1/4 (SUCTIONS) ×1 IMPLANT
YANKAUER SUCT BULB TIP NO VENT (SUCTIONS) ×1 IMPLANT

## 2023-03-15 NOTE — Consult Note (Signed)
ORTHOPAEDIC CONSULTATION  REQUESTING PHYSICIAN: Hughie Closs, MD  Chief Complaint: Abscess osteomyelitis left foot and ankle.  HPI: Jesse [name] is a 62 y.o. 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  Patient has previously been admitted and did not want to consider surgical intervention.  Patient returns to the hospital at this time with increasing pain and wishes to proceed with amputation at this time.  Past Medical History:  Diagnosis Date   AKI (acute kidney injury) (HCC) 01/27/2014   Alcohol abuse    Anasarca    Bilateral leg edema 02/07/2021   Cellulitis    Cellulitis and abscess 01/26/2014   Cellulitis of right foot 04/17/2021   Dental abscess 01/26/2014   Diabetes mellitus type 2 in obese    Diabetic foot infection (HCC) 04/08/2021   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 2 diabetes mellitus, limited to breakdown of skin (HCC)    Facial cellulitis 01/26/2014   HTN (hypertension)    Hypokalemia 01/28/2014   Infected dental carries 01/26/2014   Leukocytosis 01/27/2014   Osteomyelitis (HCC) 04/14/2021   Right foot infection    Past Surgical History:  Procedure Laterality Date   ABDOMINAL AORTOGRAM W/LOWER EXTREMITY N/A 03/06/2023   Procedure: ABDOMINAL AORTOGRAM W/LOWER EXTREMITY;  Surgeon: Cephus Shelling, MD;  Location: [facility];  Service: Cardiovascular;  Laterality: N/A;   I & D EXTREMITY Right 04/18/2021   Procedure: IRRIGATION AND DEBRIDEMENT OF FOOT;  Surgeon: Nadara Mustard, MD;  Location: [facility] OR;  Service: Orthopedics;  Laterality: Right;   I & D EXTREMITY Right 04/20/2021   Procedure: EXCISIONAL DEBRIDEMENT RIGHT FOOT, APPLICATION OF SKIN GRAFT;  Surgeon: Nadara Mustard, MD;  Location: [facility] OR;  Service: Orthopedics;  Laterality: Right;   PERIPHERAL VASCULAR INTERVENTION  03/06/2023   Procedure: PERIPHERAL VASCULAR INTERVENTION;  Surgeon: Cephus Shelling, MD;  Location: [facility];  Service: Cardiovascular;;   Social History   Socioeconomic History   Marital status: Single    Spouse name: Not on file   Number of children: Not on file   Years of education: 10th grade   Highest education level: Not on file  Occupational History   Occupation: Curator  Tobacco Use   Smoking status: Never   Smokeless tobacco: Never  Vaping Use   Vaping Use: Never used  Substance and Sexual Activity   Alcohol use: Not Currently    Alcohol/week: 2.0 standard drinks of alcohol    Types: 2 Cans of beer per week    Comment: Occasional   Drug use: No   Sexual activity: Not Currently    Partners: Female  Other Topics Concern   Not on file  Social History Narrative   Not on file   Social Determinants of Health   Financial Resource Strain: Not on file  Food Insecurity: Food Insecurity Present (03/12/2023)   Hunger Vital Sign    Worried About Running Out of Food in the Last Year: Often true    Ran Out of Food in the Last Year: Often true  Transportation Needs: Unmet Transportation Needs (03/12/2023)   PRAPARE - Administrator, Civil Service (Medical): Yes    Lack of Transportation (Non-Medical): Yes  Physical Activity: Not on file  Stress: Not on file  Social Connections: Not on file   History reviewed. No pertinent family history. - negative except otherwise stated in the family history section No  Known Allergies Prior to Admission medications   Medication Sig Start Date End Date Taking? Authorizing Provider  insulin glargine (LANTUS) 100 UNIT/ML Solostar Pen INJECT 10 UNITS INTO THE SKIN DAILY. 11/20/22  Yes Grayce Sessions, NP  acetaminophen (TYLENOL) 325 MG tablet Take 2 tablets (650 mg total) by mouth every 4 (four) hours as needed for headache or mild pain. Patient not taking: Reported on 03/12/2023 03/10/23   Rhetta Mura, MD  amoxicillin-clavulanate (AUGMENTIN) 875-125 MG tablet Take 2 tablets by mouth 2 (two) times daily. Patient not taking:  Reported on 03/12/2023 03/10/23   Rhetta Mura, MD  aspirin EC 81 MG tablet Take 1 tablet (81 mg total) by mouth daily. Swallow whole. Patient not taking: Reported on 03/12/2023 03/10/23   Rhetta Mura, MD  atorvastatin (LIPITOR) 40 MG tablet Take 1 tablet (40 mg total) by mouth at bedtime. Patient not taking: Reported on 03/12/2023 03/10/23   Rhetta Mura, MD  clopidogrel (PLAVIX) 75 MG tablet Take 1 tablet (75 mg total) by mouth daily. Patient not taking: Reported on 03/12/2023 03/10/23 03/09/24  Rhetta Mura, MD  dorzolamide-timolol (COSOPT) 2-0.5 % ophthalmic solution Place 1 drop into the right eye 2 (two) times daily. Patient not taking: Reported on 03/12/2023 11/23/22   Adam Phenix, PA-C  gabapentin (NEURONTIN) 100 MG capsule Take 1 capsule (100 mg total) by mouth 3 (three) times daily. Patient not taking: Reported on 03/12/2023 11/20/22 03/03/23  Grayce Sessions, NP  guaiFENesin (ROBITUSSIN) 100 MG/5ML liquid Take 5 mLs by mouth every 4 (four) hours as needed for cough or to loosen phlegm. Patient not taking: Reported on 03/12/2023 03/10/23   Rhetta Mura, MD  Insulin Pen Needle 32G X 4 MM MISC Use as directed with insulin pen 07/03/22   Rai, Ripudeep K, MD  Nystatin (GERHARDT'S BUTT CREAM) CREA Apply 1 Application topically 3 (three) times daily. Apply to inner thighs and buttocks liberally Patient not taking: Reported on 11/21/2022 07/03/22   Rai, Delene Ruffini, MD  oxyCODONE (OXY IR/ROXICODONE) 5 MG immediate release tablet Take 1 tablet (5 mg total) by mouth every 4 (four) hours as needed for moderate pain. Patient not taking: Reported on 03/12/2023 03/10/23   Rhetta Mura, MD  glipiZIDE (GLUCOTROL) 5 MG tablet Take 1 tablet (5 mg total) by mouth daily before breakfast. Patient not taking: Reported on 01/14/2016 01/31/14 01/14/16  Christiane Ha, MD   No results found. - pertinent xrays, CT, MRI studies were reviewed and independently  interpreted  Positive ROS: All other systems have been reviewed and were otherwise negative with the exception of those mentioned in the HPI and as above.  Physical Exam: General: Alert, no acute distress Psychiatric: Patient is competent for consent with normal mood and affect Lymphatic: No axillary or cervical lymphadenopathy Cardiovascular: No pedal edema Respiratory: No cyanosis, no use of accessory musculature GI: No organomegaly, abdomen is soft and non-tender    Images:  @ENCIMAGES @  Labs:  Lab Results  Component Value Date   HGBA1C 6.9 (H) 11/21/2022   HGBA1C 6.3 10/10/2022   HGBA1C 8.3 (H) 06/20/2022   ESRSEDRATE 132 (H) 03/03/2023   ESRSEDRATE 137 (H) 07/08/2022   ESRSEDRATE 107 (H) 06/27/2022   CRP 14.1 (H) 07/08/2022   CRP 16.9 (H) 06/27/2022   CRP 27.2 (H) 04/08/2021   LABURIC 5.3 07/04/2022   LABURIC 7.0 01/14/2016   REPTSTATUS PENDING 03/10/2023   GRAMSTAIN NO WBC SEEN NO ORGANISMS SEEN  04/18/2021   CULT  03/10/2023    NO GROWTH  4 DAYS Performed at [facility], [address] [place], [place] [address]    LABORGA PROTEUS MIRABILIS 04/18/2021    Lab Results  Component Value Date   ALBUMIN 1.9 (L) 03/12/2023   ALBUMIN 1.9 (L) 03/10/2023   ALBUMIN 1.7 (L) 03/10/2023   LABURIC 5.3 07/04/2022   LABURIC 7.0 01/14/2016        Latest Ref Rng & Units 03/14/2023    2:08 AM 03/13/2023    4:26 AM 03/12/2023    1:22 AM  CBC EXTENDED  WBC 4.0 - 10.5 K/uL 12.7  13.6  21.1   RBC 4.22 - 5.81 MIL/uL 2.98  2.82  3.23   Hemoglobin 13.0 - 17.0 g/dL 9.5  9.2  19.1   HCT 47.8 - 52.0 % 29.4  27.6  32.3   Platelets 150 - 400 K/uL 337  304  355   NEUT# 1.7 - 7.7 K/uL 9.0   17.2   Lymph# 0.7 - 4.0 K/uL 1.8   1.5     Neurologic: Patient does not have protective sensation bilateral lower extremities.   MUSCULOSKELETAL:   Skin: Examination patient has foul-smelling necrotic ulcer to the left foot.  The calf is soft no evidence of crepitation in the  calf.  Review of the MRI scan shows extensive abscess and osteomyelitis involving the hindfoot and ankle on the left.  Hemoglobin 9.5 with a white cell count of 12.7.  Albumin 1.9.  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  Plan: Plan: Will plan for transtibial amputation today.  Risk and benefits were discussed including the potential to proceed with an above-the-knee amputation today if there is abscess extending to the surgical site.  Risks and benefits were discussed including persistent infection nonhealing of the wound need for higher level amputation the potential for mortality.  Patient states he understands wished to proceed at this time.  Thank you for the consult and the opportunity to see Mr. Gilford Rile, MD [facility] [phone] 7:52 AM

## 2023-03-15 NOTE — Anesthesia Procedure Notes (Signed)
Anesthesia Regional Block: Adductor canal block   Pre-Anesthetic Checklist: , timeout performed,  Correct Patient, Correct Site, Correct Laterality,  Correct Procedure, Correct Position, site marked,  Risks and benefits discussed,  Surgical consent,  Pre-op evaluation,  At surgeon's request and post-op pain management  Laterality: Left  Prep: chloraprep       Needles:  Injection technique: Single-shot  Needle Type: Echogenic Stimulator Needle     Needle Length: 9cm  Needle Gauge: 21     Additional Needles:   Procedures:,,,, ultrasound used (permanent image in chart),,    Narrative:  Start time: 03/15/2023 7:30 AM End time: 03/15/2023 7:35 AM Injection made incrementally with aspirations every 5 mL.  Performed by: Personally  Anesthesiologist: [name], MD  Additional Notes: Discussed risks and benefits of the nerve block in detail, including but not limited vascular injury, permanent nerve damage and infection.   Patient tolerated the procedure well. Local anesthetic introduced in an incremental fashion under minimal resistance after negative aspirations. No paresthesias were elicited. After completion of the procedure, no acute issues were identified and patient continued to be monitored by RN.

## 2023-03-15 NOTE — Progress Notes (Signed)
PROGRESS NOTE    Jesse [name]  ZOX:[id number] DOB: Nov 28, 1960 DOA: 03/12/2023 PCP: Grayce Sessions, NP   Brief Narrative:  HPI: Jesse [name] is a 62 y.o. 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     Patient had just recently been hospitalized 4/22-4/29 due to sepsis thought secondary to bilateral lower extremities wounds and was diagnosed with osteomyelitis of the left heel.  Initial imaging studies had noted concern for osteomyelitis on the right heel, but MRI did not reveal this.  He had undergone revascularization with Dr. Chestine Spore during his hospital stay orthopedics had been consulted and patient was recommended for below-knee amputation by Dr. Lajoyce Corners, but at that time did not want to consider it as he did not know how he would care for himself.  After getting discharged home patient noticed that he had not been taking any other medications as previously prescribed.  Patient had come back to the emergency department on 4/29 and 4/30 due to symptoms and ultimately left AGAINST MEDICAL ADVICE.  They given him supplies and things to change his dressings at home, but after getting home reported that the foot started to bleed and he was not able to manage it.  Denied having any significant fevers or chills.  At this time he is coming to terms with needing the amputation.   In emergency department patient was noted to be mildly tachypneic with all other vital signs maintained.  Labs significant for WBC 21.1, hemoglobin 10.2, sodium 131, CO2 16, BUN 30, creatinine 1.27, anion gap 11, albumin 1.9, and lactic acid 1.2.  Urinalysis noted small hemoglobin, small leukocytes, no bacteria seen, 0-5 RBCs/hpf, and 0-5 WBCs. UDS was negative.  Patient had been started on empiric antibiotics  of vancomycin and cefepime.  Orthopedics was consulted for need of BKA.  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  He is agreeable for the surgery.  Seen by orthopedics, per their note, he is scheduled with Dr. Lajoyce Corners on Saturday.  Patient has been changing his mind back-and-forth, he threatened to leave AGAINST MEDICAL ADVICE but then decided to stay.  Patient finally underwent left BKA with Dr. Lajoyce Corners 03/15/2023.  Management per orthopedics.   Diabetic ulcer of right foot Present on admission.  Patient had been evaluated due to concern for osteomyelitis of the right foot as well, but showed no signs of septic arthritis or osteomyelitis.  Continue wound care.  Prevalon boots.   Renal insufficiency Creatinine just mildly elevated at 1.27 but he left with 1.38.  Now back to baseline.   Diabetes mellitus type 2, with long-term use of insulin Last hemoglobin A1c 6.9 on 11/21/2022.  Patient had previously been prescribed Lantus 10 units but currently on 5 units of Semglee and SSI and blood sugar controlled.   Peripheral vascular disease Patient had just underwent right anterior tibial angioplasty by  Dr. Chestine Spore on 4/25. Continue aspirin, Plavix, and statin   Chronic hyponatremia Baseline between 131-134.  At baseline now.   Normocytic anemia Stable.  Hemoglobin 10.2 which appears similar to prior. -Continue to monitor   Hyperlipidemia Continue atorvastatin.  DVT prophylaxis: SCD's Start: 03/15/23 1001 enoxaparin (LOVENOX) injection 40 mg Start: 03/12/23  0845   Code Status: Full Code  Family Communication:  None present at bedside.  Plan of care discussed with patient in length and he/she verbalized understanding and agreed with it.  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  Estimated body mass index is 28.79 kg/m as calculated from the following:   Height as of this encounter: 6' (1.829 m).   Weight as of this encounter: 96.3 kg.    Nutritional Assessment: Body mass index is 28.79 kg/m.Marland Kitchen Seen by dietician.  I agree with the assessment and plan as outlined below: Nutrition Status: Nutrition Problem: Increased nutrient needs Etiology: wound healing Signs/Symptoms: estimated needs (plan for surgery.) Interventions: Glucerna shake, MVI  . Skin Assessment: I have examined the patient's skin and I agree with the wound assessment as performed by the wound care RN as outlined below:    Consultants:  Orthopedics  Procedures:  None  Antimicrobials:  Anti-infectives (From admission, onward)    Start     Dose/Rate Route Frequency Ordered Stop   03/15/23 1100  ceFAZolin (ANCEF) IVPB 2g/100 mL premix  Status:  Discontinued        2 g 200 mL/hr over 30 Minutes Intravenous Every 8 hours 03/15/23 1000 03/15/23 1033   03/15/23 0710  ceFAZolin (ANCEF) 2-4 GM/100ML-% IVPB       Note to Pharmacy: Kathrene Bongo D: cabinet override      03/15/23 0710 03/15/23 0812   03/15/23 0600  ceFAZolin (ANCEF) IVPB 2g/100 mL premix        2 g 200 mL/hr over 30 Minutes Intravenous On call to O.R. 03/14/23 1158 03/15/23 0810   03/15/23 0600  ceFAZolin (ANCEF) IVPB 2g/100 mL premix  Status:  Discontinued        2 g 200 mL/hr over 30 Minutes Intravenous On call to O.R. 03/14/23 1200 03/14/23 1254   03/12/23 1600  vancomycin (VANCOCIN) IVPB 1000 mg/200 mL premix        1,000 mg 200 mL/hr over 60 Minutes Intravenous Every 12 hours 03/12/23 0846     03/12/23 1000  piperacillin-tazobactam (ZOSYN) IVPB 3.375  g  Status:  Discontinued        3.375 g 100 mL/hr over 30 Minutes Intravenous Every 8 hours 03/12/23 0715 03/12/23 0733   03/12/23 1000  piperacillin-tazobactam (ZOSYN) IVPB 3.375 g        3.375 g 12.5 mL/hr over 240 Minutes Intravenous Every 8 hours 03/12/23 0733     03/12/23 0330  vancomycin (VANCOREADY) IVPB 2000 mg/400 mL        2,000 mg 200 mL/hr over 120 Minutes Intravenous  Once 03/12/23 0321 03/12/23 0611   03/12/23 0315  piperacillin-tazobactam (ZOSYN) IVPB 3.375 g        3.375 g 100 mL/hr over 30 Minutes Intravenous  Once 03/12/23 0314 03/12/23 0403         Subjective: Seen and examined after the surgery.  Complains of pain in the left leg.  No other complaint. Objective: Vitals:   03/15/23 0910 03/15/23 0925 03/15/23 0938 03/15/23 1041  BP: 111/69 113/67 116/70   Pulse: 71 72 69   Resp: 20 19 19  Temp:  98.8 F (37.1 C) 98.4 F (36.9 C)   TempSrc:   Oral   SpO2: 97% 93% 98% 100%  Weight:      Height:        Intake/Output Summary (Last 24 hours) at 03/15/2023 1212 Last data filed at 03/15/2023 0842 Gross per 24 hour  Intake 976.87 ml  Output 650 ml  Net 326.87 ml    Filed Weights   03/12/23 0916  Weight: 96.3 kg    Examination:  General exam: Appears calm and comfortable  Respiratory system: Clear to auscultation. Respiratory effort normal. Cardiovascular system: S1 & S2 heard, RRR. No JVD, murmurs, rubs, gallops or clicks. No pedal edema. Gastrointestinal system: Abdomen is nondistended, soft and nontender. No organomegaly or masses felt. Normal bowel sounds heard. Central nervous system: Alert and oriented. No focal neurological deficits. Extremities: Left BKA. Skin: No rashes, lesions or ulcers.   Data Reviewed: I have personally reviewed following labs and imaging studies  CBC: Recent Labs  Lab 03/10/23 0206 03/10/23 2230 03/12/23 0122 03/13/23 0426 03/14/23 0208  WBC 23.0* 25.1* 21.1* 13.6* 12.7*  NEUTROABS  --  20.4* 17.2*  --  9.0*   HGB 10.5* 10.9* 10.2* 9.2* 9.5*  HCT 33.9* 34.2* 32.3* 27.6* 29.4*  MCV 101.8* 100.9* 100.0 97.9 98.7  PLT 356 345 355 304 337    Basic Metabolic Panel: Recent Labs  Lab 03/10/23 0206 03/10/23 2230 03/12/23 0122 03/13/23 0426 03/14/23 0208  NA 134* 131* 131* 132* 133*  K 4.3 4.6 4.3 3.9 4.3  CL 109 103 104 109 105  CO2 17* 16* 16* 17* 18*  GLUCOSE 120* 115* 120* 136* 98  BUN 24* 26* 30* 21* 15  CREATININE 1.12 1.38* 1.27* 1.13 0.94  CALCIUM 8.2* 8.4* 8.7* 8.1* 8.4*    GFR: Estimated Creatinine Clearance: 100.6 mL/min (by C-G formula based on SCr of 0.94 mg/dL). Liver Function Tests: Recent Labs  Lab 03/09/23 0142 03/10/23 0206 03/10/23 2230 03/12/23 0122  AST 15 16 17 19   ALT 13 13 14 13   ALKPHOS 115 110 114 112  BILITOT 0.8 0.7 0.6 0.6  PROT 6.6 7.0 7.4 7.7  ALBUMIN 1.6* 1.7* 1.9* 1.9*    No results for input(s): "LIPASE", "AMYLASE" in the last 168 hours. No results for input(s): "AMMONIA" in the last 168 hours. Coagulation Profile: Recent Labs  Lab 03/09/23 0142 03/10/23 2230  INR 1.7* 1.5*    Cardiac Enzymes: No results for input(s): "CKTOTAL", "CKMB", "CKMBINDEX", "TROPONINI" in the last 168 hours. BNP (last 3 results) No results for input(s): "PROBNP" in the last 8760 hours. HbA1C: No results for input(s): "HGBA1C" in the last 72 hours. CBG: Recent Labs  Lab 03/14/23 1711 03/14/23 2024 03/15/23 0644 03/15/23 0935 03/15/23 1120  GLUCAP 119* 141* 108* 120* 122*    Lipid Profile: No results for input(s): "CHOL", "HDL", "LDLCALC", "TRIG", "CHOLHDL", "LDLDIRECT" in the last 72 hours. Thyroid Function Tests: No results for input(s): "TSH", "T4TOTAL", "FREET4", "T3FREE", "THYROIDAB" in the last 72 hours. Anemia Panel: No results for input(s): "VITAMINB12", "FOLATE", "FERRITIN", "TIBC", "IRON", "RETICCTPCT" in the last 72 hours. Sepsis Labs: Recent Labs  Lab 03/10/23 2230 03/12/23 0122 03/12/23 1037  LATICACIDVEN 1.3 1.2 1.0      Recent Results (from the past 240 hour(s))  Blood Culture (routine x 2)     Status: None   Collection Time: 03/10/23 10:20 PM   Specimen: BLOOD  Result Value Ref Range Status   Specimen Description BLOOD LEFT ANTECUBITAL  Final  Special Requests   Final    BOTTLES DRAWN AEROBIC AND ANAEROBIC Blood Culture adequate volume   Culture   Final    NO GROWTH 5 DAYS Performed at [facility], [address], [place], [place] [address]    Report Status 03/15/2023 FINAL  Final  Blood Culture (routine x 2)     Status: None   Collection Time: 03/10/23 10:30 PM   Specimen: BLOOD  Result Value Ref Range Status   Specimen Description BLOOD SITE NOT SPECIFIED  Final   Special Requests   Final    BOTTLES DRAWN AEROBIC AND ANAEROBIC Blood Culture adequate volume   Culture   Final    NO GROWTH 5 DAYS Performed at [facility], [address], [place], [place] [address]    Report Status 03/15/2023 FINAL  Final  C Difficile Quick Screen w PCR reflex     Status: None   Collection Time: 03/14/23  1:06 PM   Specimen: STOOL  Result Value Ref Range Status   C Diff antigen NEGATIVE NEGATIVE Final   C Diff toxin NEGATIVE NEGATIVE Final   C Diff interpretation No C. difficile detected.  Final    Comment: Performed at [facility], [address] [place], [place] [address]  Surgical PCR screen     Status: Abnormal   Collection Time: 03/14/23  1:12 PM   Specimen: Nasal Mucosa; Nasal Swab  Result Value Ref Range Status   MRSA, PCR NEGATIVE NEGATIVE Final   Staphylococcus aureus POSITIVE (A) NEGATIVE Final    Comment: (NOTE) The Xpert SA Assay (FDA approved for NASAL specimens in patients 77 years of age and older), is one component of a comprehensive surveillance program. It is not intended to diagnose infection nor to guide or monitor treatment. Performed at [facility], [address] [place], [place] [address]      Radiology Studies: No results  found.  Scheduled Meds:  vitamin C  1,000 mg Oral Daily   aspirin EC  81 mg Oral Daily   atorvastatin  40 mg Oral QHS   Chlorhexidine Gluconate Cloth  6 each Topical Daily   clopidogrel  75 mg Oral Daily   [START ON 03/16/2023] docusate sodium  100 mg Oral Daily   enoxaparin (LOVENOX) injection  40 mg Subcutaneous Q24H   feeding supplement (GLUCERNA SHAKE)  237 mL Oral TID BM   gabapentin  100 mg Oral TID   insulin aspart  0-5 Units Subcutaneous QHS   insulin aspart  0-9 Units Subcutaneous TID WC   insulin glargine-yfgn  5 Units Subcutaneous QHS   multivitamin with minerals  1 tablet Oral Daily   mupirocin ointment  1 Application Nasal BID   nutrition supplement (JUVEN)  1 packet Oral BID BM   pantoprazole  40 mg Oral Daily   pneumococcal 20-valent conjugate vaccine  0.5 mL Intramuscular Tomorrow-1000   sodium chloride flush  3 mL Intravenous Q12H   zinc sulfate  220 mg Oral Daily   Continuous Infusions:  sodium chloride     magnesium sulfate bolus IVPB     piperacillin-tazobactam (ZOSYN)  IV 3.375 g (03/15/23 0209)   tranexamic acid     vancomycin 1,000 mg (03/15/23 0353)     LOS: 3 days   Hughie Closs, MD [facility]  03/15/2023, 12:12 PM   *Please note that this is a verbal dictation therefore any spelling or grammatical errors are due to the "[employer]agon [employer] One" system  interpretation.  Please page via Amion and do not message via secure chat for urgent patient care matters. Secure chat can be used for non urgent patient care matters.  How to contact the [facility] Attending or Consulting provider 7A - 7P or covering provider during after hours 7P -7A, for this patient?  Check the care team in [facility] and look for a) attending/consulting TR[facility] provider listed and b) the [facility] team listed. Page or secure chat 7A-7P. Log into [email or web address] and use [facility]'s universal password to access. If you do not have the password, please contact the hospital operator. Locate the [facility]  provider you are looking for under [facility] and page to a number that you can be directly reached. If you still have difficulty reaching the provider, please page the [facility] (Director on Call) for the Hospitalists listed on amion for assistance.

## 2023-03-15 NOTE — Progress Notes (Signed)
Inpatient Rehab Admissions Coordinator:  Consult received. Await therapy evaluations and recommendations to help determine appropriate rehab venue. Will continue to follow.  Rolin Schult Graves Madden, MS, CCC-SLP Admissions Coordinator [phone]  

## 2023-03-15 NOTE — Anesthesia Postprocedure Evaluation (Signed)
Anesthesia Post Note  Patient: Jesse [name]  Procedure(s) Performed: AMPUTATION BELOW KNEE (Left: Knee)     Patient location during evaluation: PACU Anesthesia Type: Regional Level of consciousness: awake and alert Pain management: pain level controlled Vital Signs Assessment: post-procedure vital signs reviewed and stable Respiratory status: spontaneous breathing, nonlabored ventilation, respiratory function stable and patient connected to nasal cannula oxygen Cardiovascular status: stable and blood pressure returned to baseline Postop Assessment: no apparent nausea or vomiting Anesthetic complications: no  No notable events documented.  Last Vitals:  Vitals:   03/15/23 0925 03/15/23 0938  BP: 113/67 116/70  Pulse: 72 69  Resp: 19 19  Temp: 37.1 C 36.9 C  SpO2: 93% 98%    Last Pain:  Vitals:   03/15/23 0938  TempSrc: Oral  PainSc:                  [name]

## 2023-03-15 NOTE — Anesthesia Procedure Notes (Signed)
Procedure Name: MAC Date/Time: 03/15/2023 8:05 AM  Performed by: Zollie Beckers, CRNAPre-anesthesia Checklist: Patient identified, Emergency Drugs available, Suction available and Patient being monitored Oxygen Delivery Method: Nasal cannula Placement Confirmation: positive ETCO2

## 2023-03-15 NOTE — Transfer of Care (Signed)
Immediate Anesthesia Transfer of Care Note  Patient: Jesse [name]  Procedure(s) Performed: AMPUTATION BELOW KNEE (Left: Knee)  Patient Location: PACU  Anesthesia Type:MAC and Regional  Level of Consciousness: awake and alert   Airway & Oxygen Therapy: Patient Spontanous Breathing  Post-op Assessment: Report given to RN and Post -op Vital signs reviewed and stable  Post vital signs: Reviewed and stable  Last Vitals:  Vitals Value Taken Time  BP 102/65 03/15/23 0853  Temp    Pulse 75 03/15/23 0857  Resp 25 03/15/23 0857  SpO2 96 % 03/15/23 0857  Vitals shown include unvalidated device data.  Last Pain:  Vitals:   03/15/23 0718  TempSrc: Oral  PainSc: 0-No pain         Complications: No notable events documented.

## 2023-03-15 NOTE — Anesthesia Procedure Notes (Signed)
Anesthesia Regional Block: Popliteal block   Pre-Anesthetic Checklist: , timeout performed,  Correct Patient, Correct Site, Correct Laterality,  Correct Procedure, Correct Position, site marked,  Risks and benefits discussed,  Surgical consent,  Pre-op evaluation,  At surgeon's request and post-op pain management  Laterality: Left  Prep: chloraprep       Needles:  Injection technique: Single-shot  Needle Type: Echogenic Stimulator Needle     Needle Length: 9cm  Needle Gauge: 21     Additional Needles:   Procedures:,,,, ultrasound used (permanent image in chart),,    Narrative:  Start time: 03/15/2023 7:25 AM End time: 03/15/2023 7:30 AM Injection made incrementally with aspirations every 5 mL.  Performed by: Personally  Anesthesiologist: [name], MD  Additional Notes: Discussed risks and benefits of the nerve block in detail, including but not limited vascular injury, permanent nerve damage and infection.   Patient tolerated the procedure well. Local anesthetic introduced in an incremental fashion under minimal resistance after negative aspirations. No paresthesias were elicited. After completion of the procedure, no acute issues were identified and patient continued to be monitored by RN.

## 2023-03-15 NOTE — Op Note (Signed)
03/15/2023  8:52 AM  PATIENT:  Jesse [name]    PRE-OPERATIVE DIAGNOSIS:  gangrene left leg  POST-OPERATIVE DIAGNOSIS:  Same  PROCEDURE:  AMPUTATION BELOW KNEE Application of Kerecis micro graft 38 cm  Application of Prevena customizable and Prevena arthroform wound VAC dressings Application of Vive Wear stump shrinker and the Hanger limb protector  SURGEON:  Nadara Mustard, MD  ANESTHESIA:   General  PREOPERATIVE INDICATIONS:  Jesse [name] is a  62 y.o. male with a diagnosis of gangrene left leg who failed conservative measures and elected for surgical management.    The risks benefits and alternatives were discussed with the patient preoperatively including but not limited to the risks of infection, bleeding, nerve injury, cardiopulmonary complications, the need for revision surgery, among others, and the patient was willing to proceed.  OPERATIVE IMPLANTS:   * No implants in log *   OPERATIVE FINDINGS: Muscle and soft tissue had good color consistency and viability at the amputation site.  No signs of of infection.  OPERATIVE PROCEDURE: Patient was brought to the operating room after undergoing a regional anesthetic.  After adequate levels anesthesia were obtained a thigh tourniquet was placed and the lower extremity was prepped using DuraPrep draped into a sterile field. The foot was draped out of the sterile field with impervious stockinette.  A timeout was called and the tourniquet inflated.  A transverse skin incision was made 12 cm distal to the tibial tubercle, the incision curved proximally, and a large posterior flap was created.  The tibia was transected just proximal to the skin incision and beveled anteriorly.  The fibula was transected just proximal to the tibial incision.  The sciatic nerve was pulled cut and allowed to retract.  The vascular bundles were suture ligated with 2-0 silk.  The tourniquet was deflated and hemostasis obtained.      The Kerecis micro  powder 38 cm was applied to the open wound that has a 200 cm surface area.   The deep and superficial fascial layers were closed using #1 Vicryl.  The skin was closed using staples.    The Prevena customizable dressing was applied this was overwrapped with the arthroform sponge.  Jesse [name] was used to secure the sponges and the circumferential compression was secured to the skin with Dermatac.  This was connected to the wound VAC pump and had a good suction fit this was covered with a stump shrinker and a limb protector.  Patient was taken to the PACU in stable condition.   DISCHARGE PLANNING:  Antibiotic duration: 24-hour antibiotics  Weightbearing: Nonweightbearing on the operative extremity  Pain medication: Opioid pathway  Dressing care/ Wound VAC: Continue wound VAC with the Prevena plus pump at discharge for 1 week  Ambulatory devices: Walker or kneeling scooter  Discharge to: Discharge planning based on recommendations per physical therapy  Follow-up: In the office 1 week after discharge.
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
## 2023-03-16 DIAGNOSIS — I96 Gangrene, not elsewhere classified: Secondary | ICD-10-CM | POA: Diagnosis not present

## 2023-03-16 LAB — CBC WITH DIFFERENTIAL/PLATELET
Abs Immature Granulocytes: 0.15 10*3/uL — ABNORMAL HIGH (ref 0.00–0.07)
Basophils Absolute: 0.1 10*3/uL (ref 0.0–0.1)
Basophils Relative: 0 %
Eosinophils Absolute: 0.3 10*3/uL (ref 0.0–0.5)
Eosinophils Relative: 2 %
HCT: 29.6 % — ABNORMAL LOW (ref 39.0–52.0)
Hemoglobin: 9.4 g/dL — ABNORMAL LOW (ref 13.0–17.0)
Immature Granulocytes: 1 %
Lymphocytes Relative: 11 %
Lymphs Abs: 1.7 10*3/uL (ref 0.7–4.0)
MCH: 32.2 pg (ref 26.0–34.0)
MCHC: 31.8 g/dL (ref 30.0–36.0)
MCV: 101.4 fL — ABNORMAL HIGH (ref 80.0–100.0)
Monocytes Absolute: 2.1 10*3/uL — ABNORMAL HIGH (ref 0.1–1.0)
Monocytes Relative: 14 %
Neutro Abs: 10.9 10*3/uL — ABNORMAL HIGH (ref 1.7–7.7)
Neutrophils Relative %: 72 %
Platelets: 373 10*3/uL (ref 150–400)
RBC: 2.92 MIL/uL — ABNORMAL LOW (ref 4.22–5.81)
RDW: 14.1 % (ref 11.5–15.5)
WBC: 15.1 10*3/uL — ABNORMAL HIGH (ref 4.0–10.5)
nRBC: 0 % (ref 0.0–0.2)

## 2023-03-16 LAB — BASIC METABOLIC PANEL
Anion gap: 12 (ref 5–15)
BUN: 28 mg/dL — ABNORMAL HIGH (ref 6–20)
CO2: 21 mmol/L — ABNORMAL LOW (ref 22–32)
Calcium: 8.3 mg/dL — ABNORMAL LOW (ref 8.9–10.3)
Chloride: 99 mmol/L (ref 98–111)
Creatinine, Ser: 1.48 mg/dL — ABNORMAL HIGH (ref 0.61–1.24)
GFR, Estimated: 54 mL/min — ABNORMAL LOW (ref >60)
Glucose, Bld: 153 mg/dL — ABNORMAL HIGH (ref 70–99)
Potassium: 4.3 mmol/L (ref 3.5–5.1)
Sodium: 132 mmol/L — ABNORMAL LOW (ref 135–145)

## 2023-03-16 LAB — GLUCOSE, CAPILLARY
Glucose-Capillary: 147 mg/dL — ABNORMAL HIGH (ref 70–99)
Glucose-Capillary: 124 mg/dL — ABNORMAL HIGH (ref 70–99)
Glucose-Capillary: 120 mg/dL — ABNORMAL HIGH (ref 70–99)
Glucose-Capillary: 150 mg/dL — ABNORMAL HIGH (ref 70–99)

## 2023-03-16 MED ORDER — ORAL CARE MOUTH RINSE: 15.0000 mL | OROMUCOSAL | Status: DC | PRN

## 2023-03-16 NOTE — Progress Notes (Signed)
PROGRESS NOTE    Jesse [name]  ZOX:[id number] DOB: Oct 04, 1961 DOA: 03/12/2023 PCP: Grayce Sessions, NP   Brief Narrative:  Jesse [name] is a 62 y.o. 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     Patient had just recently been hospitalized 4/22-4/29 due to sepsis thought secondary to bilateral lower extremities wounds and was diagnosed with osteomyelitis of the left heel.  Initial imaging studies had noted concern for osteomyelitis on the right heel, but MRI did not reveal this.  He had undergone revascularization with Dr. Chestine Spore during his hospital stay orthopedics had been consulted and patient was recommended for below-knee amputation by Dr. Lajoyce Corners, but at that time did not want to consider it as he did not know how he would care for himself.  He was also deemed to have capacity by psychiatry.  He was thus discharged on Augmentin.  He returned to ED on 4/29 and 4/30 due to continued pain and ultimately left AGAINST MEDICAL ADVICE.  They given him supplies and things to change his dressings at home, but after getting home reported that the foot started to bleed and he was not able to manage it.  Denied having any significant fevers or chills.  Turn to ED 1 more time and at this time he was agreeable for BKA. Patient had been started on empiric antibiotics of vancomycin and cefepime.  Orthopedics was consulted for need of BKA.  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 Lajoyce Corners 03/15/2023.  Will stop antibiotics today as Dr. Lajoyce Corners had recommended to continue only for 24 hours postop.  Management per orthopedics.   Diabetic ulcer of right foot Present on admission.  Patient had been evaluated due to concern for osteomyelitis of the right foot as well, but showed no signs of septic arthritis or osteomyelitis.  Continue wound care.  Prevalon boots.   Renal insufficiency Creatinine just mildly elevated at 1.27 but he left with 1.38.  Now back to baseline.   Diabetes mellitus type 2, with long-term use of insulin Last hemoglobin A1c 6.9 on 11/21/2022.  Patient had previously been prescribed Lantus 10 units but currently on 5 units of Semglee and SSI and blood sugar controlled.   Peripheral vascular disease Patient had just underwent right anterior tibial angioplasty by Dr. Chestine Spore on 4/25. Continue aspirin, Plavix, and statin   Chronic hyponatremia Baseline between 131-134.  At baseline now.   Normocytic anemia Stable.  Hemoglobin 10.2 which appears similar to prior. -Continue to monitor   Hyperlipidemia Continue atorvastatin.  DVT prophylaxis: SCD's Start: 03/15/23 1001 enoxaparin (LOVENOX) injection 40 mg Start: 03/12/23 0845   Code Status: Full Code  Family Communication:  None present at bedside.  Plan of care discussed with patient in length and he/she verbalized understanding and agreed with it.  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  Pending further  recommendations by orthopedics and PT OT eval.  Estimated body mass index is 28.79 kg/m as calculated from the following:   Height as of this encounter: 6' (1.829 m).   Weight as of this encounter: 96.3 kg.    Nutritional Assessment: Body mass index is 28.79 kg/m.Marland Kitchen Seen by dietician.  I agree with the assessment and plan as  outlined below: Nutrition Status: Nutrition Problem: Increased nutrient needs Etiology: wound healing Signs/Symptoms: estimated needs (plan for surgery.) Interventions: Glucerna shake, MVI  . Skin Assessment: I have examined the patient's skin and I agree with the wound assessment as performed by the wound care RN as outlined below:    Consultants:  Orthopedics  Procedures:  None  Antimicrobials:  Anti-infectives (From admission, onward)    Start     Dose/Rate Route Frequency Ordered Stop   03/15/23 1100  ceFAZolin (ANCEF) IVPB 2g/100 mL premix  Status:  Discontinued        2 g 200 mL/hr over 30 Minutes Intravenous Every 8 hours 03/15/23 1000 03/15/23 1033   03/15/23 0710  ceFAZolin (ANCEF) 2-4 GM/100ML-% IVPB       Note to Pharmacy: Kathrene Bongo D: cabinet override      03/15/23 0710 03/15/23 0812   03/15/23 0600  ceFAZolin (ANCEF) IVPB 2g/100 mL premix        2 g 200 mL/hr over 30 Minutes Intravenous On call to O.R. 03/14/23 1158 03/15/23 0810   03/15/23 0600  ceFAZolin (ANCEF) IVPB 2g/100 mL premix  Status:  Discontinued        2 g 200 mL/hr over 30 Minutes Intravenous On call to O.R. 03/14/23 1200 03/14/23 1254   03/12/23 1600  vancomycin (VANCOCIN) IVPB 1000 mg/200 mL premix        1,000 mg 200 mL/hr over 60 Minutes Intravenous Every 12 hours 03/12/23 0846     03/12/23 1000  piperacillin-tazobactam (ZOSYN) IVPB 3.375 g  Status:  Discontinued        3.375 g 100 mL/hr over 30 Minutes Intravenous Every 8 hours 03/12/23 0715 03/12/23 0733   03/12/23 1000  piperacillin-tazobactam (ZOSYN) IVPB 3.375 g        3.375 g 12.5 mL/hr over 240 Minutes Intravenous Every 8 hours 03/12/23 0733     03/12/23 0330  vancomycin (VANCOREADY) IVPB 2000 mg/400 mL        2,000 mg 200 mL/hr over 120 Minutes Intravenous  Once 03/12/23 0321 03/12/23 0611   03/12/23 0315  piperacillin-tazobactam (ZOSYN) IVPB 3.375 g        3.375 g 100 mL/hr over 30 Minutes Intravenous  Once 03/12/23 0314  03/12/23 0403         Subjective: Seen and examined.  He is happy that he made the decision to go with left BKA but he was still complaining of left lower extremity pain.  He was educated on phantom pain.  Objective: Vitals:   03/15/23 1702 03/15/23 2045 03/16/23 0454 03/16/23 0810  BP: 132/78 127/69 111/67 (!) 104/59  Pulse: 76 85 82 74  Resp: 19 (!) 24 17 18   Temp: 98.9 F (37.2 C) 98.4 F (36.9 C) 98.7 F (37.1 C) 98 F (36.7 C)  TempSrc: Oral Oral Oral Oral  SpO2: 100% 98% 100% 98%  Weight:      Height:        Intake/Output Summary (Last 24 hours) at 03/16/2023 1049 Last data filed at 03/16/2023 1009 Gross per 24 hour  Intake 1082.55 ml  Output 1000 ml  Net 82.55  ml    Filed Weights   03/12/23 0916  Weight: 96.3 kg    Examination:  General exam: Appears calm and comfortable  Respiratory system: Clear to auscultation. Respiratory effort normal. Cardiovascular system: S1 & S2 heard, RRR. No JVD, murmurs, rubs, gallops or clicks. No pedal edema. Gastrointestinal system: Abdomen is nondistended, soft and nontender. No organomegaly or masses felt. Normal bowel sounds heard. Central nervous system: Alert and oriented. No focal neurological deficits. Extremities: Left BKA Skin: Chronic right foot ulcer which is healing well.  Data Reviewed: I have personally reviewed following labs and imaging studies  CBC: Recent Labs  Lab 03/10/23 2230 03/12/23 0122 03/13/23 0426 03/14/23 0208 03/16/23 0831  WBC 25.1* 21.1* 13.6* 12.7* 15.1*  NEUTROABS 20.4* 17.2*  --  9.0* 10.9*  HGB 10.9* 10.2* 9.2* 9.5* 9.4*  HCT 34.2* 32.3* 27.6* 29.4* 29.6*  MCV 100.9* 100.0 97.9 98.7 101.4*  PLT 345 355 304 337 373    Basic Metabolic Panel: Recent Labs  Lab 03/10/23 2230 03/12/23 0122 03/13/23 0426 03/14/23 0208 03/16/23 0831  NA 131* 131* 132* 133* 132*  K 4.6 4.3 3.9 4.3 4.3  CL 103 104 109 105 99  CO2 16* 16* 17* 18* 21*  GLUCOSE 115* 120* 136* 98 153*  BUN 26* 30*  21* 15 28*  CREATININE 1.38* 1.27* 1.13 0.94 1.48*  CALCIUM 8.4* 8.7* 8.1* 8.4* 8.3*    GFR: Estimated Creatinine Clearance: 63.9 mL/min (A) (by C-G formula based on SCr of 1.48 mg/dL (H)). Liver Function Tests: Recent Labs  Lab 03/10/23 0206 03/10/23 2230 03/12/23 0122  AST 16 17 19   ALT 13 14 13   ALKPHOS 110 114 112  BILITOT 0.7 0.6 0.6  PROT 7.0 7.4 7.7  ALBUMIN 1.7* 1.9* 1.9*    No results for input(s): "LIPASE", "AMYLASE" in the last 168 hours. No results for input(s): "AMMONIA" in the last 168 hours. Coagulation Profile: Recent Labs  Lab 03/10/23 2230  INR 1.5*    Cardiac Enzymes: No results for input(s): "CKTOTAL", "CKMB", "CKMBINDEX", "TROPONINI" in the last 168 hours. BNP (last 3 results) No results for input(s): "PROBNP" in the last 8760 hours. HbA1C: No results for input(s): "HGBA1C" in the last 72 hours. CBG: Recent Labs  Lab 03/15/23 0935 03/15/23 1120 03/15/23 1619 03/15/23 2044 03/16/23 0748  GLUCAP 120* 122* 157* 153* 147*    Lipid Profile: No results for input(s): "CHOL", "HDL", "LDLCALC", "TRIG", "CHOLHDL", "LDLDIRECT" in the last 72 hours. Thyroid Function Tests: No results for input(s): "TSH", "T4TOTAL", "FREET4", "T3FREE", "THYROIDAB" in the last 72 hours. Anemia Panel: No results for input(s): "VITAMINB12", "FOLATE", "FERRITIN", "TIBC", "IRON", "RETICCTPCT" in the last 72 hours. Sepsis Labs: Recent Labs  Lab 03/10/23 2230 03/12/23 0122 03/12/23 1037  LATICACIDVEN 1.3 1.2 1.0     Recent Results (from the past 240 hour(s))  Blood Culture (routine x 2)     Status: None   Collection Time: 03/10/23 10:20 PM   Specimen: BLOOD  Result Value Ref Range Status   Specimen Description BLOOD LEFT ANTECUBITAL  Final   Special Requests   Final    BOTTLES DRAWN AEROBIC AND ANAEROBIC Blood Culture adequate volume   Culture   Final    NO GROWTH 5 DAYS Performed at [facility], [address] [place], [place] [address]    Report  Status 03/15/2023 FINAL  Final  Blood Culture (routine x 2)     Status: None   Collection Time: 03/10/23 10:30 PM   Specimen: BLOOD  Result Value Ref Range Status   Specimen Description BLOOD SITE NOT SPECIFIED  Final   Special Requests   Final    BOTTLES DRAWN AEROBIC AND ANAEROBIC Blood Culture adequate volume   Culture   Final    NO GROWTH 5 DAYS Performed at [facility], [address], [place], [place] [address]    Report Status 03/15/2023 FINAL  Final  C Difficile Quick Screen w PCR reflex     Status: None   Collection Time: 03/14/23  1:06 PM   Specimen: STOOL  Result Value Ref Range Status   C Diff antigen NEGATIVE NEGATIVE Final   C Diff toxin NEGATIVE NEGATIVE Final   C Diff interpretation No C. difficile detected.  Final    Comment: Performed at [facility], [address] [place], [place] [address]  Surgical PCR screen     Status: Abnormal   Collection Time: 03/14/23  1:12 PM   Specimen: Nasal Mucosa; Nasal Swab  Result Value Ref Range Status   MRSA, PCR NEGATIVE NEGATIVE Final   Staphylococcus aureus POSITIVE (A) NEGATIVE Final    Comment: (NOTE) The Xpert SA Assay (FDA approved for NASAL specimens in patients 55 years of age and older), is one component of a comprehensive surveillance program. It is not intended to diagnose infection nor to guide or monitor treatment. Performed at [facility], [address] [place], [place] [address]      Radiology Studies: No results found.  Scheduled Meds:  vitamin C  1,000 mg Oral Daily   aspirin EC  81 mg Oral Daily   atorvastatin  40 mg Oral QHS   Chlorhexidine Gluconate Cloth  6 each Topical Daily   clopidogrel  75 mg Oral Daily   docusate sodium  100 mg Oral Daily   enoxaparin (LOVENOX) injection  40 mg Subcutaneous Q24H   feeding supplement (GLUCERNA SHAKE)  237 mL Oral TID BM   gabapentin  100 mg Oral TID   insulin aspart  0-5 Units Subcutaneous QHS   insulin aspart  0-9 Units  Subcutaneous TID WC   insulin glargine-yfgn  5 Units Subcutaneous QHS   multivitamin with minerals  1 tablet Oral Daily   mupirocin ointment  1 Application Nasal BID   nutrition supplement (JUVEN)  1 packet Oral BID BM   pantoprazole  40 mg Oral Daily   pneumococcal 20-valent conjugate vaccine  0.5 mL Intramuscular Tomorrow-1000   sodium chloride flush  3 mL Intravenous Q12H   zinc sulfate  220 mg Oral Daily   Continuous Infusions:  sodium chloride 75 mL/hr at 03/15/23 1241   magnesium sulfate bolus IVPB     piperacillin-tazobactam (ZOSYN)  IV 3.375 g (03/16/23 0452)   vancomycin 1,000 mg (03/16/23 0451)     LOS: 4 days   Hughie Closs, MD [facility]  03/16/2023, 10:49 AM   *Please note that this is a verbal dictation therefore any spelling or grammatical errors are due to the "Dragon Medical One" system interpretation.  Please page via Amion and do not message via secure chat for urgent patient care matters. Secure chat can be used for non urgent patient care matters.  How to contact the [facility] Attending or Consulting provider 7A - 7P or covering provider during after hours 7P -7A, for this patient?  Check the care team in [facility] and look for a) attending/consulting TR[facility] provider listed and b) the [facility] team listed. Page or secure chat 7A-7P. Log into [email or web address]  and use 's universal password to access. If you do not have the password, please contact the hospital operator. Locate the [facility] provider you are looking for under [facility] and page to a number that you can be directly reached. If you still have difficulty reaching the provider, please page the [facility] (Director on Call) for the Hospitalists listed on amion for assistance.

## 2023-03-16 NOTE — Consult Note (Signed)
WOC Nurse Consult Note: Reason for Consult:right heel neuropathic ulcer and right foot wound. Of note, patient underwent a BKA of the left extremity this morning with Dr. Lajoyce Corners and will follow up with him in the office. Recommend Dr. Lajoyce Corners consider seeing for these ulcerations post discharge at those appointments. Wound type:Neuropathic Pressure Injury POA: N/A Measurement:Per nursing flow sheet Dressing procedure/placement/frequency: I have provided guidance for nursing in the care of these wounds using a daily soap and water cleanse rinse and dry followed by application of betadine swabstick (povidone-iodine) to the wounds. When dry, the wounds are to be covered with dry gauze and secured with a Kerlix roll gauze/paper tape. A Prevalon boot is to be worn at all times when in bed or chair.   Patient to follow for LLE with Dr. Lajoyce Corners in the office in 1 week.  Recommend Dr. Lajoyce Corners also consider following the wounds on the right foot at those appointments.  WOC nursing team will not follow, but will remain available to this patient, the nursing and medical teams.  Please re-consult if needed.  Thank you for inviting [place] to participate in this patient's Plan of Care.  Ladona Mow, MSN, RN, CNS, GNP, Leda Min, [employer], [employer] phone:  [phone]

## 2023-03-16 NOTE — Progress Notes (Signed)
Inpatient Rehab Admissions:  Inpatient Rehab Consult received.  I met with patient at the bedside for rehabilitation assessment and to discuss goals and expectations of an inpatient rehab admission.  Discussed average length of stay, insurance authorization requirement, and discharge home after completion of CIR. Pt acknowledged understanding. Pt interested in pursuing CIR. Pt unsure if he will have any assistance at home after discharge. Will discuss pt's dispo with physiatrist. Will continue to follow.  Signed: Wolfgang Phoenix, MS, CCC-SLP Admissions Coordinator [phone]

## 2023-03-16 NOTE — Plan of Care (Signed)
  Problem: Education: Goal: Individualized Educational Video(s) Outcome: Progressing   Problem: Activity: Goal: Ability to return to baseline activity level will improve Outcome: Progressing   Problem: Cardiovascular: Goal: Ability to achieve and maintain adequate cardiovascular perfusion will improve Outcome: Progressing Goal: Vascular access site(s) Level 0-1 will be maintained Outcome: Progressing   Problem: Health Behavior/Discharge Planning: Goal: Ability to safely manage health-related needs after discharge will improve Outcome: Progressing   Problem: Education: Goal: Ability to describe self-care measures that may prevent or decrease complications (Diabetes Survival Skills Education) will improve Outcome: Progressing Goal: Individualized Educational Video(s) Outcome: Progressing   Problem: Coping: Goal: Ability to adjust to condition or change in health will improve Outcome: Progressing   Problem: Fluid Volume: Goal: Ability to maintain a balanced intake and output will improve Outcome: Progressing   Problem: Health Behavior/Discharge Planning: Goal: Ability to identify and utilize available resources and services will improve Outcome: Progressing Goal: Ability to manage health-related needs will improve Outcome: Progressing   Problem: Metabolic: Goal: Ability to maintain appropriate glucose levels will improve Outcome: Progressing   Problem: Nutritional: Goal: Maintenance of adequate nutrition will improve Outcome: Progressing Goal: Progress toward achieving an optimal weight will improve Outcome: Progressing   Problem: Skin Integrity: Goal: Risk for impaired skin integrity will decrease Outcome: Progressing   Problem: Tissue Perfusion: Goal: Adequacy of tissue perfusion will improve Outcome: Progressing   Problem: Education: Goal: Knowledge of General Education information will improve Description: Including pain rating scale, medication(s)/side  effects and non-pharmacologic comfort measures Outcome: Progressing   Problem: Health Behavior/Discharge Planning: Goal: Ability to manage health-related needs will improve Outcome: Progressing   Problem: Clinical Measurements: Goal: Ability to maintain clinical measurements within normal limits will improve Outcome: Progressing Goal: Will remain free from infection Outcome: Progressing Goal: Diagnostic test results will improve Outcome: Progressing Goal: Respiratory complications will improve Outcome: Progressing Goal: Cardiovascular complication will be avoided Outcome: Progressing   Problem: Activity: Goal: Risk for activity intolerance will decrease Outcome: Progressing   Problem: Nutrition: Goal: Adequate nutrition will be maintained Outcome: Progressing   Problem: Coping: Goal: Level of anxiety will decrease Outcome: Progressing   Problem: Elimination: Goal: Will not experience complications related to bowel motility Outcome: Progressing Goal: Will not experience complications related to urinary retention Outcome: Progressing   Problem: Pain Managment: Goal: General experience of comfort will improve Outcome: Progressing   Problem: Safety: Goal: Ability to remain free from injury will improve Outcome: Progressing   Problem: Skin Integrity: Goal: Risk for impaired skin integrity will decrease Outcome: Progressing   Problem: Education: Goal: Knowledge of the prescribed therapeutic regimen will improve Outcome: Progressing Goal: Ability to verbalize activity precautions or restrictions will improve Outcome: Progressing Goal: Understanding of discharge needs will improve Outcome: Progressing   Problem: Activity: Goal: Ability to perform//tolerate increased activity and mobilize with assistive devices will improve Outcome: Progressing   Problem: Clinical Measurements: Goal: Postoperative complications will be avoided or minimized Outcome: Progressing    Problem: Self-Care: Goal: Ability to meet self-care needs will improve Outcome: Progressing   Problem: Self-Concept: Goal: Ability to maintain and perform role responsibilities to the fullest extent possible will improve Outcome: Progressing   Problem: Pain Management: Goal: Pain level will decrease with appropriate interventions Outcome: Progressing   Problem: Skin Integrity: Goal: Demonstration of wound healing without infection will improve Outcome: Progressing

## 2023-03-17 ENCOUNTER — Encounter (HOSPITAL_COMMUNITY): Payer: Self-pay | Admitting: Orthopedic Surgery

## 2023-03-17 DIAGNOSIS — I96 Gangrene, not elsewhere classified: Secondary | ICD-10-CM | POA: Diagnosis not present

## 2023-03-17 DIAGNOSIS — Z89512 Acquired absence of left leg below knee: Secondary | ICD-10-CM

## 2023-03-17 DIAGNOSIS — G8918 Other acute postprocedural pain: Secondary | ICD-10-CM | POA: Diagnosis not present

## 2023-03-17 LAB — CBC WITH DIFFERENTIAL/PLATELET
Abs Immature Granulocytes: 0.18 10*3/uL — ABNORMAL HIGH (ref 0.00–0.07)
Basophils Absolute: 0 10*3/uL (ref 0.0–0.1)
Basophils Relative: 0 %
Eosinophils Absolute: 0.3 10*3/uL (ref 0.0–0.5)
Eosinophils Relative: 2 %
HCT: 26.7 % — ABNORMAL LOW (ref 39.0–52.0)
Hemoglobin: 8.6 g/dL — ABNORMAL LOW (ref 13.0–17.0)
Immature Granulocytes: 1 %
Lymphocytes Relative: 10 %
Lymphs Abs: 1.6 10*3/uL (ref 0.7–4.0)
MCH: 32 pg (ref 26.0–34.0)
MCHC: 32.2 g/dL (ref 30.0–36.0)
MCV: 99.3 fL (ref 80.0–100.0)
Monocytes Absolute: 1.6 10*3/uL — ABNORMAL HIGH (ref 0.1–1.0)
Monocytes Relative: 11 %
Neutro Abs: 11.4 10*3/uL — ABNORMAL HIGH (ref 1.7–7.7)
Neutrophils Relative %: 76 %
Platelets: 382 10*3/uL (ref 150–400)
RBC: 2.69 MIL/uL — ABNORMAL LOW (ref 4.22–5.81)
RDW: 14.1 % (ref 11.5–15.5)
WBC: 15.1 10*3/uL — ABNORMAL HIGH (ref 4.0–10.5)
nRBC: 0 % (ref 0.0–0.2)

## 2023-03-17 LAB — RENAL FUNCTION PANEL
Albumin: 1.6 g/dL — ABNORMAL LOW (ref 3.5–5.0)
Anion gap: 10 (ref 5–15)
BUN: 27 mg/dL — ABNORMAL HIGH (ref 6–20)
CO2: 21 mmol/L — ABNORMAL LOW (ref 22–32)
Calcium: 8.5 mg/dL — ABNORMAL LOW (ref 8.9–10.3)
Chloride: 104 mmol/L (ref 98–111)
Creatinine, Ser: 1.19 mg/dL (ref 0.61–1.24)
GFR, Estimated: >60 mL/min (ref >60)
Glucose, Bld: 150 mg/dL — ABNORMAL HIGH (ref 70–99)
Phosphorus: 2.8 mg/dL (ref 2.5–4.6)
Potassium: 4.5 mmol/L (ref 3.5–5.1)
Sodium: 135 mmol/L (ref 135–145)

## 2023-03-17 LAB — MAGNESIUM: Magnesium: 1.6 mg/dL — ABNORMAL LOW (ref 1.7–2.4)

## 2023-03-17 LAB — GLUCOSE, CAPILLARY
Glucose-Capillary: 91 mg/dL (ref 70–99)
Glucose-Capillary: 140 mg/dL — ABNORMAL HIGH (ref 70–99)
Glucose-Capillary: 128 mg/dL — ABNORMAL HIGH (ref 70–99)
Glucose-Capillary: 164 mg/dL — ABNORMAL HIGH (ref 70–99)

## 2023-03-17 MED ORDER — MAGNESIUM SULFATE 2 GM/50ML IV SOLN
2.0000 g | Freq: Once | INTRAVENOUS | Status: AC
  Administered 2023-03-17: 2 g via INTRAVENOUS
  Filled 2023-03-17: qty 50

## 2023-03-17 NOTE — Hospital Course (Signed)
Jesse [name] is a 62 y.o. 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   Patient had just recently been hospitalized 4/22-4/29 due to sepsis thought secondary to bilateral lower extremities wounds and was diagnosed with osteomyelitis of the left heel.  Initial imaging studies had noted concern for osteomyelitis on the right heel, but MRI did not reveal this.  He had undergone revascularization with Dr. Chestine Spore during his hospital stay orthopedics had been consulted and patient was recommended for below-knee amputation by Dr. Lajoyce Corners, but at that time did not want to consider it as he did not know how he would care for himself.  He was also deemed to have capacity by psychiatry.  He was thus discharged on Augmentin.  He returned to ED on 4/29 and 4/30 due to continued pain and ultimately left AGAINST MEDICAL ADVICE. 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  Has a wound VAC.  Awaiting rehab.
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
## 2023-03-17 NOTE — [place] FL2 (Signed)
[place] MEDICAID FL2 LEVEL OF CARE FORM     IDENTIFICATION  Patient Name: Jesse [name] Birthdate: Apr 18, 1961 Sex: male Admission Date (Current Location): 03/12/2023  [place] and [place] Number:  Haynes Bast [id number] Facility and Address:  [facility], [address], [place], [place] [address]      Provider Number: [id number]  Attending Physician Name and Address:  Rolly Salter, MD  Relative Name and Phone Number:  Yahmir, Gish (Sister)  [phone]    Current Level of Care: SNF Recommended Level of Care: Skilled Nursing Facility Prior Approval Number:    Date Approved/Denied:   PASRR Number: [id number]  Discharge Plan: SNF    Current Diagnoses: Patient Active Problem List   Diagnosis Date Noted   Gangrene of left foot (HCC) 03/12/2023   Renal insufficiency 03/12/2023   PVD (peripheral vascular disease) (HCC) 03/12/2023   Hyponatremia 03/12/2023   Normocytic anemia 03/12/2023   Lymphedema 03/04/2023   Eye injury 11/21/2022   Hyperkalemia 07/10/2022   Chronic painful diabetic neuropathy (HCC) 07/10/2022   Metabolic acidosis, normal anion gap (NAG) 07/10/2022   Hypomagnesemia 07/10/2022   Charcot's arthropathy 06/28/2022   Leukocytosis    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)    Severe protein-calorie malnutrition (HCC)    Diabetic foot infection (HCC) 06/22/2022   Sepsis (HCC) 06/22/2022   Cellulitis in diabetic foot (HCC) 04/17/2021   Osteomyelitis of left foot (HCC) 04/14/2021   HTN (hypertension) 04/08/2021   HLD (hyperlipidemia) 04/08/2021   Alcohol abuse    Diabetic ulcer of right foot (HCC)    Diabetes mellitus type II, controlled (HCC) 01/27/2014   AKI (acute kidney injury) (HCC) 01/27/2014    Orientation RESPIRATION BLADDER Height & Weight     Self, Time, Situation, Place  Normal Continent Weight: 212 lb 4.9 oz (96.3 kg) (bed, no blankets) Height:  6'  (182.9 cm)  BEHAVIORAL SYMPTOMS/MOOD NEUROLOGICAL BOWEL NUTRITION STATUS      Continent Diet (see d/c summary)  AMBULATORY STATUS COMMUNICATION OF NEEDS Skin   Total Care Verbally Surgical wounds (Left leg closed incision, R. Heel ulcer, R. Foot ulcer)                       Personal Care Assistance Level of Assistance  Bathing, Dressing, Feeding Bathing Assistance: Limited assistance Feeding assistance: Limited assistance Dressing Assistance: Limited assistance     Functional Limitations Info  Speech, Hearing, Sight Sight Info: Adequate Hearing Info: Adequate Speech Info: Adequate    SPECIAL CARE FACTORS FREQUENCY  PT (By licensed PT), OT (By licensed OT)     PT Frequency: 5x/ week OT Frequency: 5x/ week            Contractures Contractures Info: Not present    Additional Factors Info  Code Status, Allergies, Insulin Sliding Scale Code Status Info: Full Allergies Info: No Known Allergies   Insulin Sliding Scale Info: see d/c medication list       Current Medications (03/17/2023):  This is the current hospital active medication list Current Facility-Administered Medications  Medication Dose Route Frequency Provider Last Rate Last Admin   0.9 %  sodium chloride infusion   Intravenous Continuous Nadara Mustard, MD 75 mL/hr at 03/15/23 1241 New Bag at 03/15/23 1241   acetaminophen (TYLENOL) tablet 325-650 mg  325-650 mg Oral Q6H PRN Nadara Mustard, MD       albuterol (PROVENTIL) (2.5 MG/3ML) 0.083% nebulizer solution 2.5 mg  2.5 mg Nebulization Q6H PRN Nadara Mustard, MD       alum & mag hydroxide-simeth (MAALOX/MYLANTA) 200-200-20 MG/5ML suspension 15-30 mL  15-30 mL Oral Q2H PRN Nadara Mustard, MD       ascorbic acid (VITAMIN C) tablet 1,000 mg  1,000 mg Oral Daily Nadara Mustard, MD   1,000 mg at 03/17/23 0844   aspirin EC tablet 81 mg  81 mg Oral Daily Nadara Mustard, MD   81 mg at 03/17/23 0844   atorvastatin (LIPITOR) tablet 40 mg  40 mg Oral QHS Nadara Mustard, MD   40 mg at 03/16/23 2224   bisacodyl (DULCOLAX) EC tablet 5 mg  5 mg Oral Daily PRN Nadara Mustard, MD       Chlorhexidine Gluconate Cloth 2 % PADS 6 each  6 each Topical Daily Nadara Mustard, MD   6 each at 03/17/23 0846   clopidogrel (PLAVIX) tablet 75 mg  75 mg Oral Daily Nadara Mustard, MD   75 mg at 03/17/23 0845   docusate sodium (COLACE) capsule 100 mg  100 mg Oral Daily Nadara Mustard, MD   100 mg at 03/17/23 0845   enoxaparin (LOVENOX) injection 40 mg  40 mg Subcutaneous Q24H Nadara Mustard, MD   40 mg at 03/17/23 0741   feeding supplement (GLUCERNA SHAKE) (GLUCERNA SHAKE) liquid 237 mL  237 mL Oral TID BM Nadara Mustard, MD   237 mL at 03/17/23 1359   gabapentin (NEURONTIN) capsule 100 mg  100 mg Oral TID Nadara Mustard, MD   100 mg at 03/17/23 1504   guaiFENesin-dextromethorphan (ROBITUSSIN DM) 100-10 MG/5ML syrup 15 mL  15 mL Oral Q4H PRN Nadara Mustard, MD       hydrALAZINE (APRESOLINE) injection 5 mg  5 mg Intravenous Q20 Min PRN Nadara Mustard, MD       HYDROmorphone (DILAUDID) injection 0.5-1 mg  0.5-1 mg Intravenous Q4H PRN Nadara Mustard, MD   1 mg at 03/15/23 2353   insulin aspart (novoLOG) injection 0-5 Units  0-5 Units Subcutaneous QHS Nadara Mustard, MD       insulin aspart (novoLOG) injection 0-9 Units  0-9 Units Subcutaneous TID WC Nadara Mustard, MD   1 Units at 03/17/23 1157   insulin glargine-yfgn (SEMGLEE) injection 5 Units  5 Units Subcutaneous QHS Nadara Mustard, MD   5 Units at 03/16/23 2223   labetalol (NORMODYNE) injection 10 mg  10 mg Intravenous Q10 min PRN Nadara Mustard, MD       magnesium citrate solution 1 Bottle  1 Bottle Oral Once PRN Nadara Mustard, MD       magnesium sulfate IVPB 2 g 50 mL  2 g Intravenous Daily PRN Nadara Mustard, MD       metoprolol tartrate (LOPRESSOR) injection 2-5 mg  2-5 mg Intravenous Q2H PRN Nadara Mustard, MD       multivitamin with minerals tablet 1 tablet  1 tablet Oral Daily Nadara Mustard, MD   1 tablet at 03/17/23 0844    mupirocin ointment (BACTROBAN) 2 % 1 Application  1 Application Nasal BID Nadara Mustard, MD   1 Application at 03/17/23 0845   nutrition supplement (JUVEN) (JUVEN) powder packet 1 packet  1 packet Oral BID BM Nadara Mustard, MD   1 packet at 03/17/23 1359   ondansetron (ZOFRAN) injection 4 mg  4 mg Intravenous Q6H PRN Aldean Baker  V, MD       Oral care mouth rinse  15 mL Mouth Rinse PRN Hughie Closs, MD       oxyCODONE (Oxy IR/ROXICODONE) immediate release tablet 10-15 mg  10-15 mg Oral Q4H PRN Nadara Mustard, MD   15 mg at 03/16/23 2224   oxyCODONE (Oxy IR/ROXICODONE) immediate release tablet 5-10 mg  5-10 mg Oral Q4H PRN Nadara Mustard, MD   10 mg at 03/17/23 0844   pantoprazole (PROTONIX) EC tablet 40 mg  40 mg Oral Daily Nadara Mustard, MD   40 mg at 03/17/23 0844   phenol (CHLORASEPTIC) mouth spray 1 spray  1 spray Mouth/Throat PRN Nadara Mustard, MD       pneumococcal 20-valent conjugate vaccine (PREVNAR 20) injection 0.5 mL  0.5 mL Intramuscular Tomorrow-1000 Nadara Mustard, MD       polyethylene glycol (MIRALAX / GLYCOLAX) packet 17 g  17 g Oral Daily PRN Nadara Mustard, MD       sodium chloride flush (NS) 0.9 % injection 3 mL  3 mL Intravenous Q12H Nadara Mustard, MD   3 mL at 03/17/23 0846   zinc sulfate capsule 220 mg  220 mg Oral Daily Nadara Mustard, MD   220 mg at 03/17/23 1610     Discharge Medications: Please see discharge summary for a list of discharge medications.  Relevant Imaging Results:  Relevant Lab Results:   Additional Information SSN: [id number] F Kassem Kibbe, LCSWA
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
## 2023-03-17 NOTE — Consult Note (Signed)
Physical Medicine and Rehabilitation Consult Reason for Consult:left bka, functional mobility deficits Referring Physician: Allena Katz   HPI: Jesse [name] is a 62 y.o. male with history of DM with DPN, chronic bilateral feet ulcers, PAD who had worsening of his wounds ultimately leading to osteomyelitis and gangrene. He initially turned down a left BKA and was in and out of the hospital over a week long stretch. Ultimately due to pain and ongoing wound issues/bleeding the patient agreed to a left BKA which performed on 03/15/23 by Dr. Lajoyce Corners. He has ongoing wound issues in the right foot which is being dressed daily. Blood sugars under reasonable control. Pt working through pain. He was min assist with sit-std transfers and hasn't ambulated yet. He has been min assist with ADL's.   ROS Past Medical History:  Diagnosis Date   AKI (acute kidney injury) (HCC) 01/27/2014   Alcohol abuse    Anasarca    Bilateral leg edema 02/07/2021   Cellulitis    Cellulitis and abscess 01/26/2014   Cellulitis of right foot 04/17/2021   Dental abscess 01/26/2014   Diabetes mellitus type 2 in obese    Diabetic foot infection (HCC) 04/08/2021   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 2 diabetes mellitus, limited to breakdown of skin (HCC)    Facial cellulitis 01/26/2014   HTN (hypertension)    Hypokalemia 01/28/2014   Infected dental carries 01/26/2014   Leukocytosis 01/27/2014   Osteomyelitis (HCC) 04/14/2021   Right foot infection    Past Surgical History:  Procedure Laterality Date   ABDOMINAL AORTOGRAM W/LOWER EXTREMITY N/A 03/06/2023   Procedure: ABDOMINAL AORTOGRAM W/LOWER EXTREMITY;  Surgeon: Cephus Shelling, MD;  [facility];  Service: Cardiovascular;  Laterality: N/A;   AMPUTATION Left 03/15/2023   Procedure: AMPUTATION BELOW KNEE;  Surgeon: Nadara Mustard, MD;  Location: [facility] OR;  Service: Orthopedics;  Laterality: Left;   I & D EXTREMITY Right 04/18/2021   Procedure:  IRRIGATION AND DEBRIDEMENT OF FOOT;  Surgeon: Nadara Mustard, MD;  Location[facility] OR;  Service: Orthopedics;  Laterality: Right;   I & D EXTREMITY Right 04/20/2021   Procedure: EXCISIONAL DEBRIDEMENT RIGHT FOOT, APPLICATION OF SKIN GRAFT;  Surgeon: Nadara Mustard, MD;  Location: [facility] OR;  Service: Orthopedics;  Laterality: Right;   PERIPHERAL VASCULAR INTERVENTION  03/06/2023   Procedure: PERIPHERAL VASCULAR INTERVENTION;  Surgeon: Cephus Shelling, MD;  [facility];  Service: Cardiovascular;;   History reviewed. No pertinent family history. Social History:  reports that he has never smoked. He has never used smokeless tobacco. He reports that he does not currently use alcohol after a past usage of about 2.0 standard drinks of alcohol per week. He reports that he does not use drugs. Allergies: No Known Allergies Medications Prior to Admission  Medication Sig Dispense Refill   insulin glargine (LANTUS) 100 UNIT/ML Solostar Pen INJECT 10 UNITS INTO THE SKIN DAILY. 6 mL 3   acetaminophen (TYLENOL) 325 MG tablet Take 2 tablets (650 mg total) by mouth every 4 (four) hours as needed for headache or mild pain. (Patient not taking: Reported on 03/12/2023)     amoxicillin-clavulanate (AUGMENTIN) 875-125 MG tablet Take 2 tablets by mouth 2 (two) times daily. (Patient not taking: Reported on 03/12/2023) 60 tablet 0   aspirin EC 81 MG tablet Take 1 tablet (81 mg total) by mouth daily. Swallow whole. (Patient not taking: Reported on 03/12/2023) 30 tablet 12  atorvastatin (LIPITOR) 40 MG tablet Take 1 tablet (40 mg total) by mouth at bedtime. (Patient not taking: Reported on 03/12/2023) 30 tablet 0   clopidogrel (PLAVIX) 75 MG tablet Take 1 tablet (75 mg total) by mouth daily. (Patient not taking: Reported on 03/12/2023) 30 tablet 11   dorzolamide-timolol (COSOPT) 2-0.5 % ophthalmic solution Place 1 drop into the right eye 2 (two) times daily. (Patient not taking: Reported on 03/12/2023) 10 mL 0   gabapentin  (NEURONTIN) 100 MG capsule Take 1 capsule (100 mg total) by mouth 3 (three) times daily. (Patient not taking: Reported on 03/12/2023) 90 capsule 0   guaiFENesin (ROBITUSSIN) 100 MG/5ML liquid Take 5 mLs by mouth every 4 (four) hours as needed for cough or to loosen phlegm. (Patient not taking: Reported on 03/12/2023) 120 mL 0   Insulin Pen Needle 32G X 4 MM MISC Use as directed with insulin pen 100 each 1   Nystatin (GERHARDT'S BUTT CREAM) CREA Apply 1 Application topically 3 (three) times daily. Apply to inner thighs and buttocks liberally (Patient not taking: Reported on 11/21/2022) 30 each 2   oxyCODONE (OXY IR/ROXICODONE) 5 MG immediate release tablet Take 1 tablet (5 mg total) by mouth every 4 (four) hours as needed for moderate pain. (Patient not taking: Reported on 03/12/2023) 30 tablet 0    Home: Home Living Family/patient expects to be discharged to:: Private residence Living Arrangements: Alone Available Help at Discharge: Family, Available PRN/intermittently Type of Home: House Home Access: Stairs to enter [employer] of Steps: 1 Entrance Stairs-Rails: None Home Layout: One level Bathroom Shower/Tub: Engineer, manufacturing systems: Standard Bathroom Accessibility: No Home Equipment: Wheelchair - manual, Agricultural consultant (2 wheels) Additional Comments: inconsistent info regarding home set-up. reports typically there alone, but cousin able to assist some if needed  Functional History: Prior Function Prior Level of Function : Independent/Modified Independent Mobility Comments: pt reports mod indep with use of RW and wheelchair. states he gets out 1x/wk using moped to run errands. primarily sedentary, reports "sitting in the sunshine" as his hobby ADLs Comments: reports mod indep with ADL/iADLs. 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) Functional Status:  Mobility: Bed Mobility Overal bed mobility: Modified Independent General bed mobility comments: increased  time Transfers Overall transfer level: Needs assistance Transfers: Bed to chair/wheelchair/BSC Bed to/from chair/wheelchair/BSC transfer type:: Lateral/scoot transfer  Lateral/Scoot Transfers: Min guard General transfer comment: cues for technique      ADL: ADL Overall ADL's : Needs assistance/impaired Eating/Feeding: Independent, Sitting Grooming: Wash/dry hands, Wash/dry face, Oral care, Sitting, Set up Upper Body Bathing: Minimal assistance, Sitting Lower Body Bathing: Minimal assistance, Sitting/lateral leans Upper Body Dressing : Set up, Sitting Lower Body Dressing: Moderate assistance, Sitting/lateral leans Toilet Transfer: Min guard, BSC/3in1 Toilet Transfer Details (indicate cue type and reason): simulated lateral scoot to recliner Toileting- Clothing Manipulation and Hygiene: Minimal assistance, Sitting/lateral lean  Cognition: Cognition Overall Cognitive Status: No family/caregiver present to determine baseline cognitive functioning Orientation Level: Oriented X4 Cognition Arousal/Alertness: Awake/alert Behavior During Therapy: Flat affect Overall Cognitive Status: No family/caregiver present to determine baseline cognitive functioning Area of Impairment: Problem solving, Attention Current Attention Level: Selective Problem Solving: Slow processing, Decreased initiation, Difficulty sequencing, Requires verbal cues General Comments: pleasant and agreeable to mobility. pt follows simple cues well with slight extra time. overall mildly flat affect and appears to have slightly reduced awareness of overall health/condition.  Blood pressure 108/64, pulse 81, temperature 98.3 F (36.8 C), temperature source Oral, resp. rate 18, height 6' (1.829 m), weight  96.3 kg, SpO2 98 %. Physical Exam Constitutional:      General: He is not in acute distress. HENT:     Head: Normocephalic.     Right Ear: External ear normal.     Left Ear: External ear normal.     Mouth/Throat:      Mouth: Mucous membranes are moist.  Eyes:     Pupils: Pupils are equal, round, and reactive to light.  Cardiovascular:     Rate and Rhythm: Normal rate and regular rhythm.     Pulses: Normal pulses.  Pulmonary:     Effort: Pulmonary effort is normal.  Abdominal:     Palpations: Abdomen is soft.  Musculoskeletal:     Cervical back: Normal range of motion.     Comments: Left BK in vac. RLE with chronic changes/deformities noted. Mild swelling  Skin:    Comments: Woody appearance to RLE. Foot in kerlix wrap.   Neurological:     Mental Status: He is alert.     Comments: Alert and oriented x 3. Normal insight and awareness. Intact Memory. Normal language and speech. Cranial nerve exam unremarkable. MMT: UE 4+/5. LLE limited by pain, vac but appears to have antigravity. RLE grossly 4/5. Decreased LT in foot. Marland Kitchen    Psychiatric:        Mood and Affect: Mood normal.        Behavior: Behavior normal.     Results for orders placed or performed during the hospital encounter of 03/12/23 (from the past 24 hour(s))  Glucose, capillary     Status: Abnormal   Collection Time: 03/16/23  4:16 PM  Result Value Ref Range   Glucose-Capillary 120 (H) 70 - 99 mg/dL  Glucose, capillary     Status: Abnormal   Collection Time: 03/16/23  9:21 PM  Result Value Ref Range   Glucose-Capillary 150 (H) 70 - 99 mg/dL  Glucose, capillary     Status: None   Collection Time: 03/17/23  7:20 AM  Result Value Ref Range   Glucose-Capillary 91 70 - 99 mg/dL  Renal function panel     Status: Abnormal   Collection Time: 03/17/23  9:02 AM  Result Value Ref Range   Sodium 135 135 - 145 mmol/L   Potassium 4.5 3.5 - 5.1 mmol/L   Chloride 104 98 - 111 mmol/L   CO2 21 (L) 22 - 32 mmol/L   Glucose, Bld 150 (H) 70 - 99 mg/dL   BUN 27 (H) 6 - 20 mg/dL   Creatinine, Ser 1.61 0.61 - 1.24 mg/dL   Calcium 8.5 (L) 8.9 - 10.3 mg/dL   Phosphorus 2.8 2.5 - 4.6 mg/dL   Albumin 1.6 (L) 3.5 - 5.0 g/dL   GFR, Estimated >09 >60  mL/min   Anion gap 10 5 - 15  CBC with Differential/Platelet     Status: Abnormal   Collection Time: 03/17/23  9:02 AM  Result Value Ref Range   WBC 15.1 (H) 4.0 - 10.5 K/uL   RBC 2.69 (L) 4.22 - 5.81 MIL/uL   Hemoglobin 8.6 (L) 13.0 - 17.0 g/dL   HCT 45.4 (L) 09.8 - 11.9 %   MCV 99.3 80.0 - 100.0 fL   MCH 32.0 26.0 - 34.0 pg   MCHC 32.2 30.0 - 36.0 g/dL   RDW 14.7 82.9 - 56.2 %   Platelets 382 150 - 400 K/uL   nRBC 0.0 0.0 - 0.2 %   Neutrophils Relative % 76 %   Neutro Abs 11.4 (  H) 1.7 - 7.7 K/uL   Lymphocytes Relative 10 %   Lymphs Abs 1.6 0.7 - 4.0 K/uL   Monocytes Relative 11 %   Monocytes Absolute 1.6 (H) 0.1 - 1.0 K/uL   Eosinophils Relative 2 %   Eosinophils Absolute 0.3 0.0 - 0.5 K/uL   Basophils Relative 0 %   Basophils Absolute 0.0 0.0 - 0.1 K/uL   Immature Granulocytes 1 %   Abs Immature Granulocytes 0.18 (H) 0.00 - 0.07 K/uL  Magnesium     Status: Abnormal   Collection Time: 03/17/23  9:02 AM  Result Value Ref Range   Magnesium 1.6 (L) 1.7 - 2.4 mg/dL  Glucose, capillary     Status: Abnormal   Collection Time: 03/17/23 11:33 AM  Result Value Ref Range   Glucose-Capillary 140 (H) 70 - 99 mg/dL   No results found.  Assessment/Plan: Diagnosis: 62 yo male with osteomyelitis and gangrene of the LLE due to PAD and DM, ultimately necessitating a left BKA.   Does the need for close, 24 hr/day medical supervision in concert with the patient's rehab needs make it unreasonable for this patient to be served in a less intensive setting? Yes Co-Morbidities requiring supervision/potential complications:  -diabetic ulcer right foot -acute on chronic renal insufficiency -hyponatremia -DM2 on insulin -post-op pain, wound care  Due to bladder management, bowel management, safety, skin/wound care, disease management, medication administration, pain management, and patient education, does the patient require 24 hr/day rehab nursing? Yes Does the patient require coordinated  care of a physician, rehab nurse, therapy disciplines of PT, OT to address physical and functional deficits in the context of the above medical diagnosis(es)? Yes Addressing deficits in the following areas: balance, endurance, locomotion, strength, transferring, bowel/bladder control, bathing, dressing, feeding, grooming, toileting, and psychosocial support Can the patient actively participate in an intensive therapy program of at least 3 hrs of therapy per day at least 5 days per week? Yes The potential for patient to make measurable gains while on inpatient rehab is excellent Anticipated functional outcomes upon discharge from inpatient rehab are modified independent  with PT, modified independent with OT, n/a with SLP at w/c level. Estimated rehab length of stay to reach the above functional goals is: 7-8 days Anticipated discharge destination: Home Overall Rehab/Functional Prognosis: excellent  POST ACUTE RECOMMENDATIONS: This patient's condition is appropriate for continued rehabilitative care in the following setting: CIR Patient has agreed to participate in recommended program. Yes Note that insurance prior authorization may be required for reimbursement for recommended care.  Comment: Rehab admissions coordinator to follow up. Need to make sure he understands what we're trying to accomplish on rehab. He has a sister who can only check on him at home.    MEDICAL RECOMMENDATIONS: May need further titration of gabapentin for phantom limb pain.    I have personally performed a face to face diagnostic evaluation of this patient. Additionally, I have examined the patient's medical record including any pertinent labs and radiographic images. If the physician assistant has documented in this note, I have reviewed and edited or otherwise concur with the physician assistant's documentation.  Thanks,  Ranelle Oyster, MD 03/17/2023

## 2023-03-17 NOTE — TOC Progression Note (Signed)
Transition of Care [facility]) - Initial/Assessment Note    Patient Details  Name: Jesse [name] MRN: [id number] Date of Birth: 1961-01-07  Transition of Care [facility]) CM/SW Contact:    Ralene Bathe, LCSWA Phone Number: 03/17/2023, 11:14 AM  Clinical Narrative:                 CSW received consult for possible SNF placement at time of discharge. CSW spoke with patient. Patient is hopeful that he will be admitted to inpatient rehab, but is agreeable to SNF if [facility] is unable to accept.  Patient reports preference for a facility in [place]. CSW discussed insurance authorization process and will provide Medicare SNF ratings list. CSW will send out referrals for review and provide bed offers as available.   Skilled Nursing Rehab Facilities-   [email or web address]   Ratings out of 5 stars (5 the highest)   Name Address  Phone # Quality Care Staffing Health Inspection Overall  [facility] [address] [phone] [facility] [address] [place] [phone] 4 1 3 2   [facility] [address], [facility] [phone] [facility] [address] [place] [phone] 4 1 3 2   [facility]  [address], [place] [phone] 3 2 5 5   [facility] [address], [facility] [phone] 2 1 2 1   [facility] [address] [place] [phone] 4 1 2 1   [facility] [address] [place] [phone] 2 4 3 3   [address] ([place]) [address] [place] [phone] 3 2 2 2   [facility] [address] [place], [place] [phone] 1 2 1 1   [name]) [address], [place] [phone] 3 1 1 1   [facility] [address] Liliane Shi [phone] 4 3 4 4   [facility] [address] [place] [phone] 3 4 3 3           [facility] [address] [place] [phone]       [facility], [place] [address], [place] [phone] 1 1 2 1   [facility] [address] [place] [phone] 2 2 4 4   [facility] [address] [phone] 2 1 4 3   [facility] [address] [place] [phone] 3 3 3 3           [address] (no [facility]) [address], [place] [phone] 4 4 5 5   [facility] (No [employer]) [address] [place] [address] [place], [place] [phone] 2 2 4 4   [facility] [address] [place] [phone] 2 1 2 1   [facility] (No [employer]) [address], [place] [phone] 5 5 5 5   [facility] [address], [facility] [place] [phone] 2 3 5 5   [facility] [address] [place] [phone] 2 1 1 1   [facility] [address] Liliane Shi [phone] 5 2 5 5   [facility]  [address] [place] [phone] 2 2 2 2   [facility] [address] [place] [phone] 4 2 1 1   [facility] [address] [place], [place] [phone] 2 2 3 3           [facility] [address], [place] [phone] 1 1 1 1   [facility] [address] Evlyn Clines  [phone] 2 3 3 3   [facility] (No Hum[employer]) [address], [place] [phone] 2 1 3  2  [facility]) [address], [place] [phone] 1 1 1 1   [facility] [address] [place] Levan [phone] 3 2 5 5   [facility] [address], [place] [phone] 2 1 1 1           [facility] [address] [place], [place] [phone] 4 4 5 5   [facility])  [address] [place] [phone] 2 1 2 1   [facility]) [address], [place] [phone]  1 4 3   [facility] [address], [place] [phone] 3 5 4 5   [address] [place], [place] [phone] 3 2 2 2   [facility]) [address] [place] [phone] 2 1 3 2           Patient Goals and CMS  Choice            Expected Discharge Plan and Services                                              Prior Living Arrangements/Services                       Activities of Daily Living Home Assistive Devices/Equipment: None ADL Screening (condition at time of admission) Patient's cognitive ability adequate to safely complete daily activities?: No Is the patient deaf or have difficulty hearing?: No Does the patient have difficulty seeing, even when wearing glasses/contacts?: No Does the patient have difficulty concentrating, remembering, or making decisions?: No Patient able to express need for assistance with ADLs?: Yes Does the patient have difficulty dressing or bathing?: Yes Independently performs ADLs?: No Communication: Independent Dressing (OT): Needs assistance Is this a change from baseline?: Change from baseline, expected to last >3 days Grooming: Independent (set up) Feeding: Independent (needs set up) Bathing: Needs assistance Is this a change from baseline?: Change from baseline, expected to last >3 days Toileting: Needs assistance Is this a change from baseline?: Change from baseline, expected to last >3days In/Out Bed: Needs assistance Is this a change from baseline?: Change from baseline, expected to last >3 days Walks in Home: Needs assistance Is this a change from baseline?: Change from baseline, expected to last >3 days Does the patient have difficulty walking or climbing stairs?: Yes Weakness of Legs: None Weakness of Arms/Hands: None  Permission Sought/Granted                  Emotional Assessment              Admission diagnosis:  Gangrene (HCC) [I96] Gangrene of right foot (HCC) [I96] Patient Active Problem List   Diagnosis Date Noted   Gangrene of left foot (HCC) 03/12/2023   Renal insufficiency 03/12/2023   PVD (peripheral vascular disease) (HCC) 03/12/2023   Hyponatremia 03/12/2023   Normocytic anemia  03/12/2023   Lymphedema 03/04/2023   Eye injury 11/21/2022   Hyperkalemia 07/10/2022   Chronic painful diabetic neuropathy (HCC) 07/10/2022   Metabolic acidosis, normal anion gap (NAG) 07/10/2022   Hypomagnesemia 07/10/2022   Charcot's arthropathy 06/28/2022   Leukocytosis    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)    Severe protein-calorie malnutrition (HCC)    Diabetic foot infection (HCC) 06/22/2022   Sepsis (HCC) 06/22/2022   Cellulitis in  diabetic foot (HCC) 04/17/2021   Osteomyelitis of left foot (HCC) 04/14/2021   HTN (hypertension) 04/08/2021   HLD (hyperlipidemia) 04/08/2021   Alcohol abuse    Diabetic ulcer of right foot (HCC)    Diabetes mellitus type II, controlled (HCC) 01/27/2014   AKI (acute kidney injury) (HCC) 01/27/2014   PCP:  Grayce Sessions, NP Pharmacy:   [facility] [phone] - [place], [place] - [address] AT [facility] [address] [place] [address] Phone: [phone] Fax: [phone]     Social Determinants of Health (SDOH) Social History: SDOH Screenings   Food Insecurity: Food Insecurity Present (03/12/2023)  Housing: High Risk (03/12/2023)  Transportation Needs: Unmet Transportation Needs (03/12/2023)  Utilities: Not At Risk (03/12/2023)  Depression (PHQ2-9): Medium Risk (10/28/2022)  Tobacco Use: Low Risk  (03/17/2023)   SDOH Interventions:     Readmission Risk Interventions    03/05/2023    3:13 PM  Readmission Risk Prevention Plan  Transportation Screening Complete  PCP or Specialist Appt within 5-7 Days Complete  Home Care Screening Complete  Medication Review (RN CM) Complete
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
## 2023-03-17 NOTE — Progress Notes (Addendum)
Inpatient Rehab Admissions Coordinator:  Saw pt at bedside. Discussed physiatrist goals. Pt acknowledged understanding. Pt continues to want to pursue CIR. Will continue to follow.   ADDENDUM 1410: Insurance authorization started.     Wolfgang Phoenix, MS, CCC-SLP Admissions Coordinator [phone]

## 2023-03-18 ENCOUNTER — Encounter (HOSPITAL_COMMUNITY): Payer: Self-pay | Admitting: Physical Medicine & Rehabilitation

## 2023-03-18 ENCOUNTER — Other Ambulatory Visit: Payer: Self-pay

## 2023-03-18 ENCOUNTER — Inpatient Hospital Stay (HOSPITAL_COMMUNITY)
Admission: RE | Admit: 2023-03-18 | Discharge: 2023-04-02 | DRG: 464 | Disposition: A | Discharge status: Skilled Nursing Facility | Payer: Medicaid Other | Source: Intra-hospital | Attending: Physical Medicine & Rehabilitation | Admitting: Physical Medicine & Rehabilitation

## 2023-03-18 DIAGNOSIS — D62 Acute posthemorrhagic anemia: Secondary | ICD-10-CM | POA: Diagnosis present

## 2023-03-18 DIAGNOSIS — L97419 Non-pressure chronic ulcer of right heel and midfoot with unspecified severity: Secondary | ICD-10-CM | POA: Diagnosis present

## 2023-03-18 DIAGNOSIS — E1151 Type 2 diabetes mellitus with diabetic peripheral angiopathy without gangrene: Secondary | ICD-10-CM | POA: Diagnosis not present

## 2023-03-18 DIAGNOSIS — E11621 Type 2 diabetes mellitus with foot ulcer: Secondary | ICD-10-CM | POA: Diagnosis present

## 2023-03-18 DIAGNOSIS — I959 Hypotension, unspecified: Secondary | ICD-10-CM | POA: Diagnosis not present

## 2023-03-18 DIAGNOSIS — R944 Abnormal results of kidney function studies: Secondary | ICD-10-CM | POA: Diagnosis present

## 2023-03-18 DIAGNOSIS — Z89512 Acquired absence of left leg below knee: Secondary | ICD-10-CM | POA: Diagnosis not present

## 2023-03-18 DIAGNOSIS — Z7409 Other reduced mobility: Secondary | ICD-10-CM | POA: Diagnosis present

## 2023-03-18 DIAGNOSIS — D75839 Thrombocytosis, unspecified: Secondary | ICD-10-CM | POA: Diagnosis not present

## 2023-03-18 DIAGNOSIS — K5900 Constipation, unspecified: Secondary | ICD-10-CM | POA: Diagnosis not present

## 2023-03-18 DIAGNOSIS — E871 Hypo-osmolality and hyponatremia: Secondary | ICD-10-CM | POA: Diagnosis present

## 2023-03-18 DIAGNOSIS — I96 Gangrene, not elsewhere classified: Secondary | ICD-10-CM | POA: Diagnosis not present

## 2023-03-18 DIAGNOSIS — F54 Psychological and behavioral factors associated with disorders or diseases classified elsewhere: Secondary | ICD-10-CM | POA: Diagnosis not present

## 2023-03-18 DIAGNOSIS — I872 Venous insufficiency (chronic) (peripheral): Secondary | ICD-10-CM | POA: Diagnosis present

## 2023-03-18 DIAGNOSIS — H5789 Other specified disorders of eye and adnexa: Secondary | ICD-10-CM | POA: Diagnosis not present

## 2023-03-18 DIAGNOSIS — E118 Type 2 diabetes mellitus with unspecified complications: Secondary | ICD-10-CM

## 2023-03-18 DIAGNOSIS — M79671 Pain in right foot: Secondary | ICD-10-CM | POA: Diagnosis not present

## 2023-03-18 DIAGNOSIS — Z794 Long term (current) use of insulin: Secondary | ICD-10-CM | POA: Diagnosis not present

## 2023-03-18 DIAGNOSIS — I10 Essential (primary) hypertension: Secondary | ICD-10-CM | POA: Diagnosis present

## 2023-03-18 DIAGNOSIS — Z79899 Other long term (current) drug therapy: Secondary | ICD-10-CM

## 2023-03-18 DIAGNOSIS — E119 Type 2 diabetes mellitus without complications: Secondary | ICD-10-CM

## 2023-03-18 DIAGNOSIS — R5381 Other malaise: Secondary | ICD-10-CM | POA: Diagnosis present

## 2023-03-18 DIAGNOSIS — F32A Depression, unspecified: Secondary | ICD-10-CM | POA: Diagnosis present

## 2023-03-18 DIAGNOSIS — R7989 Other specified abnormal findings of blood chemistry: Secondary | ICD-10-CM | POA: Diagnosis not present

## 2023-03-18 DIAGNOSIS — L97519 Non-pressure chronic ulcer of other part of right foot with unspecified severity: Secondary | ICD-10-CM | POA: Diagnosis present

## 2023-03-18 DIAGNOSIS — M869 Osteomyelitis, unspecified: Secondary | ICD-10-CM | POA: Diagnosis not present

## 2023-03-18 DIAGNOSIS — D72829 Elevated white blood cell count, unspecified: Secondary | ICD-10-CM | POA: Diagnosis present

## 2023-03-18 DIAGNOSIS — E785 Hyperlipidemia, unspecified: Secondary | ICD-10-CM | POA: Diagnosis present

## 2023-03-18 DIAGNOSIS — M868X6 Other osteomyelitis, lower leg: Secondary | ICD-10-CM

## 2023-03-18 DIAGNOSIS — L02611 Cutaneous abscess of right foot: Secondary | ICD-10-CM | POA: Diagnosis not present

## 2023-03-18 DIAGNOSIS — R748 Abnormal levels of other serum enzymes: Secondary | ICD-10-CM | POA: Diagnosis present

## 2023-03-18 DIAGNOSIS — R799 Abnormal finding of blood chemistry, unspecified: Secondary | ICD-10-CM | POA: Diagnosis not present

## 2023-03-18 DIAGNOSIS — Z4781 Encounter for orthopedic aftercare following surgical amputation: Principal | ICD-10-CM

## 2023-03-18 DIAGNOSIS — E1152 Type 2 diabetes mellitus with diabetic peripheral angiopathy with gangrene: Secondary | ICD-10-CM | POA: Diagnosis present

## 2023-03-18 DIAGNOSIS — L853 Xerosis cutis: Secondary | ICD-10-CM | POA: Diagnosis present

## 2023-03-18 DIAGNOSIS — I890 Lymphedema, not elsewhere classified: Secondary | ICD-10-CM | POA: Diagnosis present

## 2023-03-18 LAB — CBC WITH DIFFERENTIAL/PLATELET
Abs Immature Granulocytes: 0.11 10*3/uL — ABNORMAL HIGH (ref 0.00–0.07)
Basophils Absolute: 0.1 10*3/uL (ref 0.0–0.1)
Basophils Relative: 0 %
Eosinophils Absolute: 0.4 10*3/uL (ref 0.0–0.5)
Eosinophils Relative: 3 %
HCT: 26.4 % — ABNORMAL LOW (ref 39.0–52.0)
Hemoglobin: 8.3 g/dL — ABNORMAL LOW (ref 13.0–17.0)
Immature Granulocytes: 1 %
Lymphocytes Relative: 13 %
Lymphs Abs: 1.7 10*3/uL (ref 0.7–4.0)
MCH: 31.8 pg (ref 26.0–34.0)
MCHC: 31.4 g/dL (ref 30.0–36.0)
MCV: 101.1 fL — ABNORMAL HIGH (ref 80.0–100.0)
Monocytes Absolute: 1.6 10*3/uL — ABNORMAL HIGH (ref 0.1–1.0)
Monocytes Relative: 12 %
Neutro Abs: 10 10*3/uL — ABNORMAL HIGH (ref 1.7–7.7)
Neutrophils Relative %: 71 %
Platelets: 393 10*3/uL (ref 150–400)
RBC: 2.61 MIL/uL — ABNORMAL LOW (ref 4.22–5.81)
RDW: 14 % (ref 11.5–15.5)
WBC: 13.9 10*3/uL — ABNORMAL HIGH (ref 4.0–10.5)
nRBC: 0 % (ref 0.0–0.2)

## 2023-03-18 LAB — GLUCOSE, CAPILLARY
Glucose-Capillary: 101 mg/dL — ABNORMAL HIGH (ref 70–99)
Glucose-Capillary: 143 mg/dL — ABNORMAL HIGH (ref 70–99)
Glucose-Capillary: 143 mg/dL — ABNORMAL HIGH (ref 70–99)
Glucose-Capillary: 128 mg/dL — ABNORMAL HIGH (ref 70–99)
Glucose-Capillary: 143 mg/dL — ABNORMAL HIGH (ref 70–99)

## 2023-03-18 LAB — BASIC METABOLIC PANEL
Anion gap: 10 (ref 5–15)
BUN: 25 mg/dL — ABNORMAL HIGH (ref 6–20)
CO2: 20 mmol/L — ABNORMAL LOW (ref 22–32)
Calcium: 8.4 mg/dL — ABNORMAL LOW (ref 8.9–10.3)
Chloride: 103 mmol/L (ref 98–111)
Creatinine, Ser: 1.01 mg/dL (ref 0.61–1.24)
GFR, Estimated: >60 mL/min (ref >60)
Glucose, Bld: 132 mg/dL — ABNORMAL HIGH (ref 70–99)
Potassium: 4.2 mmol/L (ref 3.5–5.1)
Sodium: 133 mmol/L — ABNORMAL LOW (ref 135–145)

## 2023-03-18 LAB — MAGNESIUM: Magnesium: 1.9 mg/dL (ref 1.7–2.4)

## 2023-03-18 LAB — SURGICAL PATHOLOGY

## 2023-03-18 MED ORDER — ALUM & MAG HYDROXIDE-SIMETH 200-200-20 MG/5ML PO SUSP: 30.0000 mL | ORAL | Status: DC | PRN

## 2023-03-18 MED ORDER — INSULIN ASPART 100 UNIT/ML IJ SOLN
0.0000 [IU] | Freq: Three times a day (TID) | INTRAMUSCULAR | Status: DC
  Administered 2023-03-19 – 2023-03-22 (×7): 1 [IU] via SUBCUTANEOUS
  Administered 2023-03-23: 2 [IU] via SUBCUTANEOUS
  Administered 2023-03-24: 1 [IU] via SUBCUTANEOUS
  Administered 2023-03-25 – 2023-03-26 (×2): 2 [IU] via SUBCUTANEOUS
  Administered 2023-03-26 – 2023-03-27 (×2): 1 [IU] via SUBCUTANEOUS
  Administered 2023-03-28: 2 [IU] via SUBCUTANEOUS
  Administered 2023-03-29: 1 [IU] via SUBCUTANEOUS
  Administered 2023-03-29: 2 [IU] via SUBCUTANEOUS
  Administered 2023-03-30 (×3): 1 [IU] via SUBCUTANEOUS
  Administered 2023-03-31: 2 [IU] via SUBCUTANEOUS
  Administered 2023-03-31 (×2): 1 [IU] via SUBCUTANEOUS
  Administered 2023-04-01: 2 [IU] via SUBCUTANEOUS
  Administered 2023-04-01: 1 [IU] via SUBCUTANEOUS
  Administered 2023-04-02: 2 [IU] via SUBCUTANEOUS

## 2023-03-18 MED ORDER — GABAPENTIN 100 MG PO CAPS
100.0000 mg | ORAL_CAPSULE | Freq: Three times a day (TID) | ORAL | Status: DC
  Administered 2023-03-18 – 2023-04-02 (×43): 100 mg via ORAL
  Filled 2023-03-18 (×43): qty 1

## 2023-03-18 MED ORDER — OXYCODONE HCL 5 MG PO TABS
10.0000 mg | ORAL_TABLET | ORAL | Status: DC | PRN
  Administered 2023-03-19 (×2): 10 mg via ORAL
  Administered 2023-03-20: 15 mg via ORAL
  Administered 2023-03-22: 10 mg via ORAL
  Filled 2023-03-18: qty 2
  Filled 2023-03-18: qty 3
  Filled 2023-03-18: qty 2
  Filled 2023-03-18: qty 3
  Filled 2023-03-18: qty 2

## 2023-03-18 MED ORDER — ZINC SULFATE 220 (50 ZN) MG PO CAPS: 220.0000 mg | ORAL_CAPSULE | Freq: Every day | ORAL | 0 refills | Status: AC

## 2023-03-18 MED ORDER — METHOCARBAMOL 500 MG PO TABS
500.0000 mg | ORAL_TABLET | Freq: Four times a day (QID) | ORAL | Status: DC | PRN
  Administered 2023-03-20: 500 mg via ORAL
  Filled 2023-03-18: qty 1

## 2023-03-18 MED ORDER — VITAMIN C 500 MG PO TABS
1000.0000 mg | ORAL_TABLET | Freq: Every day | ORAL | Status: DC
  Administered 2023-03-19 – 2023-04-02 (×14): 1000 mg via ORAL
  Filled 2023-03-18 (×14): qty 2

## 2023-03-18 MED ORDER — SORBITOL 70 % SOLN: 30.0000 mL | Freq: Every day | Status: DC | PRN

## 2023-03-18 MED ORDER — JUVEN PO PACK
1.0000 | PACK | Freq: Two times a day (BID) | ORAL | Status: DC
  Administered 2023-03-19 – 2023-04-01 (×21): 1 via ORAL
  Filled 2023-03-18 (×22): qty 1

## 2023-03-18 MED ORDER — ACETAMINOPHEN 325 MG PO TABS: 325.0000 mg | ORAL_TABLET | ORAL | Status: DC | PRN

## 2023-03-18 MED ORDER — ATORVASTATIN CALCIUM 40 MG PO TABS
40.0000 mg | ORAL_TABLET | Freq: Every day | ORAL | Status: DC
  Administered 2023-03-18 – 2023-03-20 (×3): 40 mg via ORAL
  Filled 2023-03-18 (×3): qty 1

## 2023-03-18 MED ORDER — ONDANSETRON HCL 4 MG/2ML IJ SOLN: 4.0000 mg | Freq: Four times a day (QID) | INTRAMUSCULAR | Status: DC | PRN

## 2023-03-18 MED ORDER — PANTOPRAZOLE SODIUM 40 MG PO TBEC
40.0000 mg | DELAYED_RELEASE_TABLET | Freq: Every day | ORAL | Status: DC
  Administered 2023-03-19 – 2023-04-02 (×14): 40 mg via ORAL
  Filled 2023-03-18 (×14): qty 1

## 2023-03-18 MED ORDER — GABAPENTIN 100 MG PO CAPS: 100.0000 mg | ORAL_CAPSULE | Freq: Three times a day (TID) | ORAL | 0 refills | Status: AC

## 2023-03-18 MED ORDER — ASPIRIN 81 MG PO TBEC
81.0000 mg | DELAYED_RELEASE_TABLET | Freq: Every day | ORAL | Status: DC
  Administered 2023-03-19 – 2023-03-30 (×11): 81 mg via ORAL
  Filled 2023-03-18 (×11): qty 1

## 2023-03-18 MED ORDER — GLUCERNA SHAKE PO LIQD: 237.0000 mL | Freq: Three times a day (TID) | ORAL | 0 refills | Status: AC

## 2023-03-18 MED ORDER — POLYETHYLENE GLYCOL 3350 17 G PO PACK
17.0000 g | PACK | Freq: Every day | ORAL | Status: DC | PRN
  Administered 2023-03-30: 17 g via ORAL
  Filled 2023-03-18: qty 1

## 2023-03-18 MED ORDER — POLYETHYLENE GLYCOL 3350 17 G PO PACK: 17.0000 g | PACK | Freq: Every day | ORAL | 0 refills | Status: AC | PRN

## 2023-03-18 MED ORDER — JUVEN PO PACK: 1.0000 | PACK | Freq: Two times a day (BID) | ORAL | 0 refills | Status: AC

## 2023-03-18 MED ORDER — FLEET ENEMA 7-19 GM/118ML RE ENEM: 1.0000 | ENEMA | Freq: Once | RECTAL | Status: DC | PRN

## 2023-03-18 MED ORDER — OXYCODONE-ACETAMINOPHEN 5-325 MG PO TABS: 1.0000 | ORAL_TABLET | ORAL | 0 refills | Status: DC | PRN

## 2023-03-18 MED ORDER — INSULIN GLARGINE-YFGN 100 UNIT/ML ~~LOC~~ SOLN
5.0000 [IU] | Freq: Every day | SUBCUTANEOUS | Status: DC
  Administered 2023-03-18 – 2023-04-01 (×15): 5 [IU] via SUBCUTANEOUS
  Filled 2023-03-18 (×17): qty 0.05

## 2023-03-18 MED ORDER — TRAZODONE HCL 50 MG PO TABS: 50.0000 mg | ORAL_TABLET | Freq: Every evening | ORAL | Status: DC | PRN

## 2023-03-18 MED ORDER — INSULIN ASPART 100 UNIT/ML IJ SOLN: 0.0000 [IU] | Freq: Every day | INTRAMUSCULAR | Status: DC

## 2023-03-18 MED ORDER — ENOXAPARIN SODIUM 40 MG/0.4ML IJ SOSY: 40.0000 mg | PREFILLED_SYRINGE | INTRAMUSCULAR | Status: DC

## 2023-03-18 MED ORDER — CLOPIDOGREL BISULFATE 75 MG PO TABS
75.0000 mg | ORAL_TABLET | Freq: Every day | ORAL | Status: DC
  Administered 2023-03-19 – 2023-03-30 (×11): 75 mg via ORAL
  Filled 2023-03-18 (×11): qty 1

## 2023-03-18 MED ORDER — OXYCODONE HCL 5 MG PO TABS
5.0000 mg | ORAL_TABLET | ORAL | Status: DC | PRN
  Administered 2023-03-24: 5 mg via ORAL
  Administered 2023-03-25: 10 mg via ORAL
  Filled 2023-03-18: qty 1
  Filled 2023-03-18: qty 2

## 2023-03-18 MED ORDER — ZINC SULFATE 220 (50 ZN) MG PO CAPS
220.0000 mg | ORAL_CAPSULE | Freq: Every day | ORAL | Status: AC
  Administered 2023-03-19 – 2023-03-27 (×9): 220 mg via ORAL
  Filled 2023-03-18 (×9): qty 1

## 2023-03-18 MED ORDER — ASCORBIC ACID 1000 MG PO TABS: 1000.0000 mg | ORAL_TABLET | Freq: Every day | ORAL | 0 refills | Status: AC

## 2023-03-18 MED ORDER — GUAIFENESIN-DM 100-10 MG/5ML PO SYRP: 10.0000 mL | ORAL_SOLUTION | Freq: Four times a day (QID) | ORAL | Status: DC | PRN

## 2023-03-18 MED ORDER — ENOXAPARIN SODIUM 40 MG/0.4ML IJ SOSY
40.0000 mg | PREFILLED_SYRINGE | INTRAMUSCULAR | Status: DC
  Administered 2023-03-19 – 2023-03-30 (×11): 40 mg via SUBCUTANEOUS
  Filled 2023-03-18 (×11): qty 0.4

## 2023-03-18 MED ORDER — ADULT MULTIVITAMIN W/MINERALS CH
1.0000 | ORAL_TABLET | Freq: Every day | ORAL | Status: DC
  Administered 2023-03-19 – 2023-04-02 (×14): 1 via ORAL
  Filled 2023-03-18 (×14): qty 1

## 2023-03-18 MED ORDER — INSULIN GLARGINE 100 UNIT/ML SOLOSTAR PEN: 5.0000 [IU] | PEN_INJECTOR | Freq: Every day | SUBCUTANEOUS | 3 refills | Status: DC

## 2023-03-18 MED ORDER — PANTOPRAZOLE SODIUM 40 MG PO TBEC: 40.0000 mg | DELAYED_RELEASE_TABLET | Freq: Every day | ORAL | 0 refills | Status: AC

## 2023-03-18 MED ORDER — DOCUSATE SODIUM 100 MG PO CAPS
100.0000 mg | ORAL_CAPSULE | Freq: Every day | ORAL | Status: DC
  Administered 2023-03-19 – 2023-04-02 (×14): 100 mg via ORAL
  Filled 2023-03-18 (×14): qty 1

## 2023-03-18 MED ORDER — MUPIROCIN 2 % EX OINT
1.0000 | TOPICAL_OINTMENT | Freq: Two times a day (BID) | CUTANEOUS | Status: AC
  Administered 2023-03-19: 1 via NASAL
  Filled 2023-03-18 (×2): qty 22

## 2023-03-18 MED ORDER — DOCUSATE SODIUM 100 MG PO CAPS: 100.0000 mg | ORAL_CAPSULE | Freq: Every day | ORAL | 0 refills | Status: DC

## 2023-03-18 MED ORDER — ADULT MULTIVITAMIN W/MINERALS CH: 1.0000 | ORAL_TABLET | Freq: Every day | ORAL | Status: AC

## 2023-03-18 NOTE — Plan of Care (Signed)
  Problem: Consults Goal: RH LIMB LOSS PATIENT EDUCATION Description: Description: See Patient Education module for eduction specifics. Outcome: Progressing   Problem: RH BOWEL ELIMINATION Goal: RH STG MANAGE BOWEL WITH ASSISTANCE Description: STG Manage Bowel with mod I Assistance. Outcome: Progressing Goal: RH STG MANAGE BOWEL W/MEDICATION W/ASSISTANCE Description: STG Manage Bowel with Medication with mod I Assistance. Outcome: Progressing   Problem: RH SKIN INTEGRITY Goal: RH STG SKIN FREE OF INFECTION/BREAKDOWN Description: Manage independently Outcome: Progressing Goal: RH STG MAINTAIN SKIN INTEGRITY WITH ASSISTANCE Description: STG Maintain Skin Integrity With Assistance. Outcome: Progressing Goal: RH STG ABLE TO PERFORM INCISION/WOUND CARE W/ASSISTANCE Description: STG Able To Perform Incision/Wound Care Without Assistance. Outcome: Progressing   Problem: RH SAFETY Goal: RH STG ADHERE TO SAFETY PRECAUTIONS W/ASSISTANCE/DEVICE Description: STG Adhere to Safety Precautions With cues Assistance/Device. Outcome: Progressing   Problem: RH KNOWLEDGE DEFICIT LIMB LOSS Goal: RH STG INCREASE KNOWLEDGE OF SELF CARE AFTER LIMB LOSS Description: Patient will be able to manage care independently using educational resources for medications, skin care, and dietary modifications. Outcome: Progressing   Problem: Education: Goal: Knowledge of the prescribed therapeutic regimen will improve Outcome: Progressing Goal: Ability to verbalize activity precautions or restrictions will improve Outcome: Progressing Goal: Understanding of discharge needs will improve Outcome: Progressing   Problem: Activity: Goal: Ability to perform//tolerate increased activity and mobilize with assistive devices will improve Outcome: Progressing   Problem: Clinical Measurements: Goal: Postoperative complications will be avoided or minimized Outcome: Progressing   Problem: Self-Care: Goal: Ability to  meet self-care needs will improve Outcome: Progressing   Problem: Self-Concept: Goal: Ability to maintain and perform role responsibilities to the fullest extent possible will improve Outcome: Progressing   Problem: Pain Management: Goal: Pain level will decrease with appropriate interventions Outcome: Progressing   Problem: Skin Integrity: Goal: Demonstration of wound healing without infection will improve Outcome: Progressing   Problem: Education: Goal: Understanding of CV disease, CV risk reduction, and recovery process will improve Outcome: Progressing Goal: Individualized Educational Video(s) Outcome: Progressing   Problem: Activity: Goal: Ability to return to baseline activity level will improve Outcome: Progressing   Problem: Cardiovascular: Goal: Ability to achieve and maintain adequate cardiovascular perfusion will improve Outcome: Progressing Goal: Vascular access site(s) Level 0-1 will be maintained Outcome: Progressing   Problem: Health Behavior/Discharge Planning: Goal: Ability to safely manage health-related needs after discharge will improve Outcome: Progressing

## 2023-03-18 NOTE — TOC Transition Note (Signed)
Transition of Care [facility]) - CM/SW Discharge Note   Patient Details  Name: Jesse [name] MRN: [id number] Date of Birth: 12-13-1960  [facility] CM/SW Contact:  Tom-Johnson, Hershal Coria, RN Phone Number: 03/18/2023, 1:33 PM   Clinical Narrative:     Patient is scheduled for discharge to CIR today. In-hospital transfer via bed. No further TOC needs noted  Final next level of care: IP Rehab Facility Barriers to Discharge: Barriers Resolved   Patient Goals and CMS Choice      Discharge Placement                  Patient to be transferred to facility by: In-hospital transfer to CIR via bed      Discharge Plan and Services Additional resources added to the After Visit Summary for                  DME Arranged: N/A DME Agency: NA         HH Agency: NA        Social Determinants of Health (SDOH) Interventions SDOH Screenings   Food Insecurity: Food Insecurity Present (03/12/2023)  Housing: High Risk (03/12/2023)  Transportation Needs: Unmet Transportation Needs (03/12/2023)  Utilities: Not At Risk (03/12/2023)  Depression (PHQ2-9): Medium Risk (10/28/2022)  Tobacco Use: Low Risk  (03/17/2023)     Readmission Risk Interventions    03/18/2023    1:31 PM 03/05/2023    3:13 PM  Readmission Risk Prevention Plan  Transportation Screening Complete Complete  PCP or Specialist Appt within 5-7 Days  Complete  Home Care Screening  Complete  Medication Review (RN CM)  Complete  Medication Review (RN Care Manager) Referral to Pharmacy   PCP or Specialist appointment within 3-5 days of discharge Complete   HRI or Home Care Consult Complete   SW Recovery Care/Counseling Consult Complete   Palliative Care Screening Not Applicable   Skilled Nursing Facility Not Applicable

## 2023-03-18 NOTE — Discharge Summary (Signed)
Physician Discharge Summary   Patient: Jesse [name] MRN: [id number] DOB: [birth year]  Admit date:     03/12/2023  Discharge date: 03/18/23  Discharge Physician: Lynden Oxford  PCP: Grayce Sessions, NP  Recommendations at discharge: Follow up with PCP and Dr Lajoyce Corners as recommended Pt will need prescriptions according to his progress at the CIR   Follow-up Information     Nadara Mustard, MD Follow up in 1 week(s).   Specialty: Orthopedic Surgery Contact information: [address] [place] [address] [phone]         Grayce Sessions, NP. Schedule an appointment as soon as possible for a visit in 1 week(s).   Specialty: Internal Medicine Contact information: [address] [place] [address] [phone]                Discharge Diagnoses: Principal Problem:   Gangrene of left foot [facility]) Active Problems:   Osteomyelitis of left foot (HCC)   Diabetic ulcer of right foot (HCC)   Leukocytosis   Renal insufficiency   Diabetes mellitus type II, controlled (HCC)   PVD (peripheral vascular disease) (HCC)   Hyponatremia   Normocytic anemia   HLD (hyperlipidemia)  Hospital Course: Jesse [name] is a 62 y.o. 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   Patient had just recently been hospitalized 4/22-4/29 due to sepsis thought secondary to bilateral lower extremities wounds and was diagnosed with osteomyelitis of the left heel.  Initial imaging studies had noted concern for osteomyelitis on the right heel, but MRI did not reveal this.  He had undergone revascularization with Dr. Chestine Spore during his hospital stay orthopedics had been consulted and patient was recommended for below-knee amputation by Dr. Lajoyce Corners, but at that time did not want to  consider it as he did not know how he would care for himself.  He was also deemed to have capacity by psychiatry.  He was thus discharged on Augmentin.  He returned to ED on 4/29 and 4/30 due to continued pain and ultimately left AGAINST MEDICAL ADVICE. 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  Has a wound VAC.  Awaiting rehab. Assessment and Plan  Osteomyelitis of the left foot with gangrene See details above, patient finally underwent left BKA with Dr. Lajoyce Corners 03/15/2023. Postoperatively Dr. Lajoyce Corners had recommended to continue only for 24 hours.  Management per orthopedics.  Has a wound VAC.  Nonweightbearing recommended.  PT OT recommending CIR.  Currently being evaluated.   Diabetic ulcer of right foot Present on admission.  Patient had been evaluated due to concern for osteomyelitis of the right foot as well, but showed no signs of septic arthritis or osteomyelitis.  Continue wound care.  Prevalon boots.   Renal insufficiency Creatinine just mildly elevated at 1.27 but he left with 1.38.  Now back to baseline.   Diabetes mellitus type 2, with long-term use of insulin with PVD Last hemoglobin A1c 6.9 on 11/21/2022.  Patient had previously been prescribed Lantus 10 units but currently on 5 units of Semglee and SSI and blood sugar controlled.   Peripheral vascular disease Patient had just underwent right anterior tibial angioplasty by Dr. Chestine Spore on 4/25. Continue aspirin, Plavix, and statin   Chronic hyponatremia Baseline between 131-134.  At baseline now.   Normocytic anemia Hemoglobin appears Stable to prior. -  Continue to monitor   Hyperlipidemia Continue atorvastatin.   Consultants:  Orthopedics   Procedures performed:  AMPUTATION BELOW KNEE Application of Kerecis micro graft 38 cm  Application of Prevena customizable and Prevena arthroform wound VAC dressings Application of Vive Wear stump shrinker and the Hanger limb protector  DISCHARGE MEDICATION: Allergies as of 03/18/2023   No Known  Allergies      Medication List     STOP taking these medications    amoxicillin-clavulanate 875-125 MG tablet Commonly known as: AUGMENTIN   FT Tussin Adult 100 MG/5ML liquid Generic drug: guaiFENesin   Gerhardt's butt cream Crea   oxyCODONE 5 MG immediate release tablet Commonly known as: Oxy IR/ROXICODONE       TAKE these medications    acetaminophen 325 MG tablet Commonly known as: TYLENOL Take 2 tablets (650 mg total) by mouth every 4 (four) hours as needed for headache or mild pain.   ascorbic acid 1000 MG tablet Commonly known as: VITAMIN C Take 1 tablet (1,000 mg total) by mouth daily. Start taking on: Mar 19, 2023   Aspirin Low Dose 81 MG tablet Generic drug: aspirin EC Take 1 tablet (81 mg total) by mouth daily. Swallow whole.   atorvastatin 40 MG tablet Commonly known as: LIPITOR Take 1 tablet (40 mg total) by mouth at bedtime.   clopidogrel 75 MG tablet Commonly known as: Plavix Take 1 tablet (75 mg total) by mouth daily.   docusate sodium 100 MG capsule Commonly known as: COLACE Take 1 capsule (100 mg total) by mouth daily. Start taking on: Mar 19, 2023   dorzolamide-timolol 2-0.5 % ophthalmic solution Commonly known as: COSOPT Place 1 drop into the right eye 2 (two) times daily.   feeding supplement (GLUCERNA SHAKE) Liqd Take 237 mLs by mouth 3 (three) times daily between meals.   nutrition supplement (JUVEN) Pack Take 1 packet by mouth 2 (two) times daily between meals.   gabapentin 100 MG capsule Commonly known as: NEURONTIN Take 1 capsule (100 mg total) by mouth 3 (three) times daily.   insulin glargine 100 UNIT/ML Solostar Pen Commonly known as: LANTUS Inject 5 Units into the skin daily. What changed: how much to take   Insulin Pen Needle 32G X 4 MM Misc Use as directed with insulin pen   multivitamin with minerals Tabs tablet Take 1 tablet by mouth daily. Start taking on: Mar 19, 2023   oxyCODONE-acetaminophen 5-325 MG  tablet Commonly known as: Percocet Take 1 tablet by mouth every 4 (four) hours as needed for severe pain or moderate pain.   pantoprazole 40 MG tablet Commonly known as: PROTONIX Take 1 tablet (40 mg total) by mouth daily. Start taking on: Mar 19, 2023   polyethylene glycol 17 g packet Commonly known as: MIRALAX / GLYCOLAX Take 17 g by mouth daily as needed for mild constipation.   zinc sulfate 220 (50 Zn) MG capsule Take 1 capsule (220 mg total) by mouth daily. Start taking on: Mar 19, 2023               Discharge Care Instructions  (From admission, onward)           Start     Ordered   03/18/23 0000  Discharge wound care:       Comments: Wound care to right heel neuropathic ulcer and right foot wound: Wash with soap and water, rinse and dry. Paint wounds with povidone-iodine swabsticks, allow to dry. When dry, cover wounds with dry dressing and  secure with Kerlix roll gauze/paper tape. Place foot into Prevalon boot.   03/18/23 1301           Disposition: CIR Diet recommendation: Carb modified diet  Discharge Exam: Vitals:   03/17/23 1502 03/17/23 2106 03/18/23 0431 03/18/23 0715  BP: 112/68 135/69 119/60 117/66  Pulse: 79 81 80 75  Resp: 18 17 12 18   Temp: 98 F (36.7 C) 99.2 F (37.3 C) 98.3 F (36.8 C) 98.2 F (36.8 C)  TempSrc: Oral Oral Oral Oral  SpO2: 98% 100% 96% 99%  Weight:      Height:       General: Appear in mild distress; no visible Abnormal Neck Mass Or lumps, Conjunctiva normal Cardiovascular: S1 and S2 Present, no Murmur, Respiratory: good respiratory effort, Bilateral Air entry present and CTA, no Crackles, no wheezes Abdomen: Bowel Sound present, Non tender  Extremities: no right Pedal edema, left leg AKA,  both wrapped.  Neurology: alert and oriented to time, place, and person  [facility] Weights   03/12/23 0916  Weight: 96.3 kg   Condition at discharge: stable  The results of significant diagnostics from this hospitalization  (including imaging, microbiology, ancillary and laboratory) are listed below for reference.   Imaging Studies: DG Chest 1 View  Result Date: 03/10/2023 CLINICAL DATA:  Sepsis EXAM: CHEST  1 VIEW COMPARISON:  11/21/2022 FINDINGS: Low lung volumes. Chronic elevation of right diaphragm. No acute airspace disease or effusion. Stable cardiomediastinal silhouette with aortic atherosclerosis. No pneumothorax IMPRESSION: No active disease. Low lung volumes with chronic elevation of right diaphragm. Electronically Signed   By: Jasmine Pang M.D.   On: 03/10/2023 22:52   DG Foot Complete Left  Result Date: 03/10/2023 CLINICAL DATA:  Gangrene EXAM: LEFT FOOT - COMPLETE 3+ VIEW COMPARISON:  MRI 03/08/2023, radiograph 03/03/2023 FINDINGS: Large heel ulcer, appears increased compared to the prior radiographs. Increased gas in the heel soft tissues and tracking posterior to the ankle suspicious for necrotic infection. Suspected gas within the calcaneus. Lucency and erosive change at the plantar posterior calcaneus concerning for osteomyelitis with redemonstrated fracture lucency extending towards the posterior subtalar joint. Diffuse soft tissue edema, progressive. No change in the erosion at the tuft of first distal phalanx. Similar small erosions at the first proximal phalanx and head of the first metatarsal. IMPRESSION: 1. Increased gas in the heel soft tissues and tracking posterior, medial and lateral to the ankle suspicious for necrotic infection. Increased size of large heel ulcer. 2. Suspected gas within the calcaneus. Lucency and erosive change at the calcaneus consistent with osteomyelitis. Fracture deformity as before extending obliquely from the posterior plantar calcaneus towards the posterior subtalar joint. 3. Worsened soft tissue edema. 4. Similar erosive change at the tuft of first distal phalanx. Similar small erosions at the head of the first metatarsal and proximal phalanx. Electronically Signed   By:  Jasmine Pang M.D.   On: 03/10/2023 22:52   MR FOOT LEFT W WO CONTRAST  Result Date: 03/08/2023 CLINICAL DATA:  Suspected infection. Calcaneal lesion on radiography. Foot ulcers. EXAM: MRI OF THE LEFT FOREFOOT WITHOUT AND WITH CONTRAST TECHNIQUE: Multiplanar, multisequence MR imaging of the left foot was performed both before and after administration of intravenous contrast. Osteomyelitis protocol MRI of the foot was obtained, to include the entire foot and ankle. This protocol uses a large field of view to cover the entire foot and ankle, and is suitable for assessing bony structures for osteomyelitis. Due to the large field of view and imaging  plane choice, this protocol is less sensitive for assessing small structures such as ligamentous structures of the foot and ankle, compared to a dedicated forefoot or dedicated hindfoot exam. CONTRAST:  10mL GADAVIST GADOBUTROL 1 MMOL/ML IV SOLN COMPARISON:  Radiographs 03/03/2023 FINDINGS: Bones/Joint/Cartilage Long cloaca versus less likely fracture of the calcaneus noted, with a long bony defect in central calcaneus extending from the posterior subtalar joint margin to the posteroinferior surface of the calcaneus were discontinuous with local ulceration and gas in the soft tissues as on image 20 of series 14. Approaching the posterior subtalar joint this process becomes less appreciable long the lateral and medial cortex of the calcaneus, which would be unusual for fracture and which causes me to favor this as representing a long cloaca. Diffuse edema and enhancement in the calcaneus compatible with osteomyelitis. There is some bony erosion along the posteroinferior calcaneus margin in the vicinity of the localized ulcerations and gas collections. There is poor fat saturation in the vicinity of the phalanges, interfering with characterization, although on STIR images no definite phalangeal osseous edema is observed. Degenerative findings including spurring and lateral  erosion along the head of the first metatarsal, possibly from gout arthropathy. Small geode along the base of the fourth metatarsal. Ligaments N/A Muscles and Tendons Obscured plantar fascial attachment site the calcaneus, with extensive localized gas and hypoenhancing and possibly devascularized soft tissues along the plantar heel. Diffuse edema in the distal forefoot musculature, probably neurogenic, components of infectious myositis not excluded. Soft tissues As discussed above there is extensive ulceration and gas along the posterior plantar heel tracking adjacent to the calcaneal cloaca and tracking medial and lateral to the calcaneus. Some of the collections of gas and fluid may potentially be loculated/contained, for example the 3.8 by 2.9 by 2.5 cm collection of gas and fluid medial to the calcaneus on image 67 of series 10. Dorsal subcutaneous edema and enhancement tracks along the forefoot, probably from cellulitis. IMPRESSION: 1. 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 The posterior capsule inferior calcaneus is demineralized from infection. Extensive osteomyelitis of the calcaneus. 2. Extensive ulceration and gas along the posterior plantar heel tracking adjacent to the calcaneal cloaca and tracking medial and lateral to the calcaneus. Some of the collections of gas and fluid may be loculated/contained, such as the collection medial to the calcaneus described above. 3. Dorsal subcutaneous edema and enhancement tracks along the forefoot, probably from cellulitis. 4. Degenerative findings including spurring and lateral erosion along the head of the first metatarsal, possibly from gout arthropathy. 5. Diffuse edema in the distal forefoot musculature, probably neurogenic, components of infectious myositis not excluded. 6. Obscured plantar fascial attachment site the calcaneus, with extensive localized gas and hypoenhancing and  possibly devascularized soft tissues along the plantar heel tracking adjacent to the calcaneal cloaca and tracking medial and lateral to the calcaneus. Electronically Signed   By: Gaylyn Rong M.D.   On: 03/08/2023 14:36   PERIPHERAL VASCULAR CATHETERIZATION  Result Date: 03/06/2023 Images from the original result were not included.   Patient name: Jesse [name]      MRN: [id number]        DOB: Apr 21, 1961            Sex: male  03/06/2023 Pre-operative Diagnosis: Bilateral lower extremity ulceration Post-operative diagnosis:  Same Surgeon:  Cephus Shelling, MD Procedure Performed: 1.  Ultrasound-guided access left common femoral artery 2.  Aortogram with catheter selection of aorta  3.  Bilateral lower extremity arteriogram 4.  Right lower extremity arteriogram with selection of the SFA and anterior tibial artery 5.  Right anterior tibial artery angioplasty (3 mm x 80 mm Sterling and 4 mm x 80 mm Sterling) 6.  Mynx closure of the left common femoral artery  Indications: 62 year old male presented with bilateral lower extremity ulceration.  Has been evaluated by orthopedic surgery who has recommended a left below-knee amputation.  He presents today for attempted limb salvage of the right foot after risk benefits discussed.  Findings:  Aortogram showed no flow-limiting stenosis in the aortoiliac segment.  The right renal artery was visualized and was widely patent.  Right lower extremity arteriogram showed a widely patent common femoral, profunda, SFA, above and below-knee popliteal artery.  Patient had three-vessel runoff distally.  The anterior tibial had a focal 80% stenosis in the mid vessel.  The peroneal artery was diseased distally just above the ankle.  Dominant runoff into the foot was through the posterior tibial that was widely patent.  The anterior tibial stenosis was then treated with a 3 mm Sterling with 50% residual disease and I upsized to a 4 mm Sterling with less than 30% residual  stenosis.  Preserved three-vessel runoff.  Left lower extremity arteriogram showed a widely patent common femoral, profunda, SFA, popliteal artery and what looked like three-vessel runoff although the contrast timing was poor.  Anterior tibial has some disease in the proximal vessel.             Procedure:  The patient was identified in the holding area and taken to room 8.  The patient was then placed supine on the table and prepped and draped in the usual sterile fashion.  A time out was called.  Ultrasound was used to evaluate the left common femoral artery.  It was patent .  A digital ultrasound image was acquired.  A micropuncture needle was used to access the left common femoral artery under ultrasound guidance.  An 018 wire was advanced without resistance and a micropuncture sheath was placed.  The 018 wire was removed and a benson wire was placed.  The micropuncture sheath was exchanged for a 5 french sheath.  An omniflush catheter was advanced over the wire to the level of L-1.  An abdominal angiogram was obtained.  The catheter was pulled down in the abdominal aorta and we shot bilateral lower extremity runoff.  After evaluating images elected for tibial intervention.  Then crossed the aortic bifurcation with an Omni Flush catheter.  I placed a wire down the right SFA and exchanged for a long 5 French Catapault sheath in the left groin over the aortic bifurcation into the right SFA.  We got some dedicated right lower extremity hand-injection's to identify the lesion.  I then attempted pass a V18 down to the tibial arteries but this kept going out branches so used a J-wire to get into the below-knee popliteal artery and then exchanged back for the V18 over a quick cross catheter.  I then used a V18 to cannulate the anterior tibial through hand-injection and I got down the anterior tibial and crossed the stenosis.  The anterior tibial was treated with a 3 mm x 80 mm Sterling to nominal pressure for 2  minutes.  There was still 50% residual disease so I upsized to a 4 mm x 80 mm Sterling to nominal pressure for 2 minutes.  Excellent results.  Preserved three-vessel runoff.  Wires and catheters were removed.  Mynx was deployed in the left groin.  Plan: Patient is optimized in the right lower extremity from vascular surgery standpoint.  Three-vessel runoff.  Will load on aspirin today.  Hold Plavix pending operative intervention from orthopedic surgery with Dr. Lajoyce Corners.  Cephus Shelling, MD [facility] Office: [phone]    MR FOOT RIGHT W WO CONTRAST  Result Date: 03/04/2023 CLINICAL DATA:  Open wound on the plantar aspect of the foot. EXAM: MRI OF THE RIGHT FOREFOOT WITHOUT AND WITH CONTRAST TECHNIQUE: Multiplanar, multisequence MR imaging of the right foot was performed before and after the administration of intravenous contrast. CONTRAST:  10mL GADAVIST GADOBUTROL 1 MMOL/ML IV SOLN COMPARISON:  Radiographs 03/03/2023 FINDINGS: Examination is quite limited due to patient motion. Large open wound noted on the plantar aspect. Diffuse severe cellulitis and diffuse myofasciitis but no discrete abscess. No definite MR findings for septic arthritis or osteomyelitis. Moderate midfoot degenerative changes could be early neuropathic disease. IMPRESSION: 1. Very limited examination due to patient motion. 2. Large open wound on the plantar aspect but no discrete abscess. 3. Diffuse cellulitis and myofasciitis. 4. No definite MR findings for septic arthritis or osteomyelitis. 5. Moderate midfoot degenerative changes could be early neuropathic disease. Electronically Signed   By: Rudie Meyer M.D.   On: 03/04/2023 08:29   DG Foot 2 Views Right  Result Date: 03/03/2023 CLINICAL DATA:  Foot ulcers. EXAM: RIGHT FOOT - 2 VIEW COMPARISON:  Right foot radiographs 09/18/2022; MRI right forefoot 06/23/2022 FINDINGS: There is diffuse decreased bone mineralization. There is again a staple  overlying the plantar midfoot soft tissues. In this region there is a more superficial soft tissue ulceration measuring approximately 7 mm in craniocaudal depth, similar to prior. There is worsened scattered subcutaneous air extending into the superficial hindfoot soft tissues just posterior to this ulcer. There is a mild-to-moderate plantar calcaneal heel spur. There may be slightly worsened low bone mineralization seen at the tip of the calcaneal heel spur, and it is difficult to exclude acute osteomyelitis. Mild interphalangeal joint space narrowing diffusely. Mild second tarsometatarsal joint space narrowing. Mild dorsal midfoot degenerative osteophytes on lateral view. No acute fracture. IMPRESSION: There is a staple overlying the plantar midfoot soft tissues with similar overlying anterior midfoot soft tissue ulcer. There is worsened scattered subcutaneous air extending into the superficial/plantar hindfoot soft tissues just posterior to this ulcer. Possible new bone loss at the tip of a plantar calcaneal heel spur, and it is difficult to exclude early acute osteomyelitis in this region. Electronically Signed   By: Neita Garnet M.D.   On: 03/03/2023 10:33   DG Foot 2 Views Left  Addendum Date: 03/03/2023   ADDENDUM REPORT: 03/03/2023 10:29 IMPRESSION: As described in the findings, there is a new acute fracture of the calcaneus extending anteriorly and superiorly from the region of posterior plantar calcaneal bone erosion that is concerning for osteomyelitis. Electronically Signed   By: Neita Garnet M.D.   On: 03/03/2023 10:29   Result Date: 03/03/2023 CLINICAL DATA:  Foot ulcers. EXAM: LEFT FOOT - 2 VIEW COMPARISON:  Left foot radiographs 06/20/2022 FINDINGS: There is mild soft tissue loss of the distal medial aspect of the great toe which is mildly improved from prior and likely reflects interval at least partial healing of the prior ulcer. There is minimal worsening of the previously seen  mild-to-moderate erosion of the distal medial aspect of the distal phalanx of the great toe, now with more chronic appearing partially corticated  margin. Minimal great toe metatarsophalangeal joint space narrowing and peripheral osteophytosis. New high-grade ulceration of the plantar heel in a region measuring up to approximately 7 cm in AP dimension. Lucency from this ulceration is also seen at the medial ankle/hindfoot on frontal view. There is new lucency within the adjacent inferior aspect of the calcaneus in a region measuring approximately 1.4 cm in craniocaudal height. Two new linear longitudinal acute fracture line lucency is extending from this region superiorly and anteriorly to the posterior calcaneal facet at the posterior subtalar joint. Mild dorsal navicular-cuneiform degenerative osteophytes. IMPRESSION: 1. New high-grade ulceration of the plantar heel in a region measuring up to 7 cm in AP dimension. There is new lucency and cortical erosion within the adjacent inferior aspect of the calcaneus in a region measuring approximately 1.4 cm in craniocaudal height. This is suspicious for acute osteomyelitis. 2. There is minimal worsening of the previously seen mild-to-moderate erosion of the distal medial aspect of the great toe distal phalanx. Improvement in the overlying distal medial great toe soft tissue ulcer. This may represent the sequela of the prior active osteomyelitis. It is unclear whether there is any ongoing osteomyelitis in this region. Electronically Signed: By: Neita Garnet M.D. On: 03/03/2023 10:24    Microbiology: Results for orders placed or performed during the hospital encounter of 03/12/23  C Difficile Quick Screen w PCR reflex     Status: None   Collection Time: 03/14/23  1:06 PM   Specimen: STOOL  Result Value Ref Range Status   C Diff antigen NEGATIVE NEGATIVE Final   C Diff toxin NEGATIVE NEGATIVE Final   C Diff interpretation No C. difficile detected.  Final     Comment: Performed at [facility], [address], [place], [place] [address]  Surgical PCR screen     Status: Abnormal   Collection Time: 03/14/23  1:12 PM   Specimen: Nasal Mucosa; Nasal Swab  Result Value Ref Range Status   MRSA, PCR NEGATIVE NEGATIVE Final   Staphylococcus aureus POSITIVE (A) NEGATIVE Final    Comment: (NOTE) The Xpert SA Assay (FDA approved for NASAL specimens in patients 90 years of age and older), is one component of a comprehensive surveillance program. It is not intended to diagnose infection nor to guide or monitor treatment. Performed at [facility], [address] [place], [place] [address]    Labs: CBC: Recent Labs  Lab 03/12/23 0122 03/13/23 0426 03/14/23 0208 03/16/23 0831 03/17/23 0902 03/18/23 0850  WBC 21.1* 13.6* 12.7* 15.1* 15.1* 13.9*  NEUTROABS 17.2*  --  9.0* 10.9* 11.4* 10.0*  HGB 10.2* 9.2* 9.5* 9.4* 8.6* 8.3*  HCT 32.3* 27.6* 29.4* 29.6* 26.7* 26.4*  MCV 100.0 97.9 98.7 101.4* 99.3 101.1*  PLT 355 304 337 373 382 393   Basic Metabolic Panel: Recent Labs  Lab 03/13/23 0426 03/14/23 0208 03/16/23 0831 03/17/23 0902 03/18/23 0850  NA 132* 133* 132* 135 133*  K 3.9 4.3 4.3 4.5 4.2  CL 109 105 99 104 103  CO2 17* 18* 21* 21* 20*  GLUCOSE 136* 98 153* 150* 132*  BUN 21* 15 28* 27* 25*  CREATININE 1.13 0.94 1.48* 1.19 1.01  CALCIUM 8.1* 8.4* 8.3* 8.5* 8.4*  MG  --   --   --  1.6* 1.9  PHOS  --   --   --  2.8  --    Liver Function Tests: Recent Labs  Lab 03/12/23 0122 03/17/23 0902  AST 19  --  ALT 13  --   ALKPHOS 112  --   BILITOT 0.6  --   PROT 7.7  --   ALBUMIN 1.9* 1.6*   CBG: Recent Labs  Lab 03/17/23 1133 03/17/23 1616 03/17/23 2105 03/18/23 0707 03/18/23 1116  GLUCAP 140* 128* 164* 101* 143*    Discharge time spent: greater than 30 minutes.  Signed: Lynden Oxford, MD [facility]

## 2023-03-18 NOTE — Plan of Care (Signed)
  Problem: Activity: Goal: Ability to return to baseline activity level will improve Outcome: Progressing   Problem: Cardiovascular: Goal: Ability to achieve and maintain adequate cardiovascular perfusion will improve Outcome: Progressing Goal: Vascular access site(s) Level 0-1 will be maintained Outcome: Progressing   Problem: Health Behavior/Discharge Planning: Goal: Ability to safely manage health-related needs after discharge will improve Outcome: Progressing   Problem: Education: Goal: Ability to describe self-care measures that may prevent or decrease complications (Diabetes Survival Skills Education) will improve Outcome: Progressing Goal: Individualized Educational Video(s) Outcome: Progressing   Problem: Coping: Goal: Ability to adjust to condition or change in health will improve Outcome: Progressing   Problem: Fluid Volume: Goal: Ability to maintain a balanced intake and output will improve Outcome: Progressing   Problem: Health Behavior/Discharge Planning: Goal: Ability to identify and utilize available resources and services will improve Outcome: Progressing Goal: Ability to manage health-related needs will improve Outcome: Progressing   Problem: Metabolic: Goal: Ability to maintain appropriate glucose levels will improve Outcome: Progressing   Problem: Nutritional: Goal: Maintenance of adequate nutrition will improve Outcome: Progressing Goal: Progress toward achieving an optimal weight will improve Outcome: Progressing   Problem: Skin Integrity: Goal: Risk for impaired skin integrity will decrease Outcome: Progressing   Problem: Tissue Perfusion: Goal: Adequacy of tissue perfusion will improve Outcome: Progressing   Problem: Education: Goal: Knowledge of General Education information will improve Description: Including pain rating scale, medication(s)/side effects and non-pharmacologic comfort measures Outcome: Progressing   Problem: Health  Behavior/Discharge Planning: Goal: Ability to manage health-related needs will improve Outcome: Progressing   Problem: Clinical Measurements: Goal: Ability to maintain clinical measurements within normal limits will improve Outcome: Progressing Goal: Will remain free from infection Outcome: Progressing Goal: Diagnostic test results will improve Outcome: Progressing Goal: Respiratory complications will improve Outcome: Progressing Goal: Cardiovascular complication will be avoided Outcome: Progressing   Problem: Activity: Goal: Risk for activity intolerance will decrease Outcome: Progressing   Problem: Nutrition: Goal: Adequate nutrition will be maintained Outcome: Progressing   Problem: Coping: Goal: Level of anxiety will decrease Outcome: Progressing   Problem: Elimination: Goal: Will not experience complications related to bowel motility Outcome: Progressing Goal: Will not experience complications related to urinary retention Outcome: Progressing   Problem: Pain Managment: Goal: General experience of comfort will improve Outcome: Progressing   Problem: Safety: Goal: Ability to remain free from injury will improve Outcome: Progressing   Problem: Skin Integrity: Goal: Risk for impaired skin integrity will decrease Outcome: Progressing   Problem: Education: Goal: Knowledge of the prescribed therapeutic regimen will improve Outcome: Progressing Goal: Ability to verbalize activity precautions or restrictions will improve Outcome: Progressing Goal: Understanding of discharge needs will improve Outcome: Progressing   Problem: Activity: Goal: Ability to perform//tolerate increased activity and mobilize with assistive devices will improve Outcome: Progressing   Problem: Clinical Measurements: Goal: Postoperative complications will be avoided or minimized Outcome: Progressing   Problem: Self-Care: Goal: Ability to meet self-care needs will improve Outcome:  Progressing   Problem: Self-Concept: Goal: Ability to maintain and perform role responsibilities to the fullest extent possible will improve Outcome: Progressing   Problem: Pain Management: Goal: Pain level will decrease with appropriate interventions Outcome: Progressing   Problem: Skin Integrity: Goal: Demonstration of wound healing without infection will improve Outcome: Progressing

## 2023-03-18 NOTE — Progress Notes (Signed)
PMR Admission Coordinator Pre-Admission Assessment   Patient: Jesse [name] is an 63 y.o., male MRN: [id number] DOB: 05/06/1961 Height: 6' (182.9 cm) Weight: 96.3 kg (bed, no blankets)                                                                                                                                                  Insurance Information HMO:     PPO:      PCP:      IPA:      80/20:      OTHER:  PRIMARY: [place] Medicaid [employer]      Policy#: [id number]      Subscriber: patient CM Name: Debbe Odea       Phone#: [phone]     Fax#: [phone] Pre-Cert#: [id number]      Employer:  Approved 5/7 for admit 5/7-5/14 Benefits:  Phone #: [phone]     Name:  Eff. Date: 06/11/22-06/11/23     Deduct:       Out of Pocket Max:       Life Max:   CIR: 100% coverage      SNF:  Outpatient:      Co-Pay:  Home Health:       Co-Pay:  DME:      Co-Pay:  Providers: in-network SECONDARY:       Policy#:       Phone#:    Artist:       Phone#:    The Data processing manager" for patients in Inpatient Rehabilitation Facilities with attached "Privacy Act Statement-Health Care Records" was provided and verbally reviewed with: N/A   Emergency Contact Information Contact Information       Name Relation Home Work Mobile    [place] Sister [phone]   [phone]    [name], [name]     [phone]         Current Medical History  Patient Admitting Diagnosis: s/p L BKA History of Present Illness: Pt is a 62 year old male with  medical hx significant for: chronic bilateral feet diabetic ulcers, HTN, HLD, SDH 2/2 trauma, DM II with diabetic neuropathy, PVD, lymphedema. Pt presented to [facility] on 03/15/23 d/t foot pain. Pt also had bloody discharge from left heel. Pt recently admitted 03/03/23-03/10/23 d/t sepsis thought secondary to bilateral lower extremities wounds. Diagnosed with osteomyelitis of left heel. Underwent revascularization with  Dr. Chestine Spore on 4/25.  BKA recommended and pt declined amputation.Pt returned to ED on 4/29-4/30 d/t continuous symptoms and ultimately left AMA. During current presentation, pt now agreeable to amputation. 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 Therapy evaluations completed and CIR recommended d/t pt's deficits in functional mobility and inability to complete ADLs independently. Glasgow Coma Scale Score: (!) 20  Patient's medical record from [facility] has been reviewed by the rehabilitation admission coordinator and physician.   Past Medical History      Past Medical History:  Diagnosis Date   AKI (acute kidney injury) (HCC) 01/27/2014   Alcohol abuse     Anasarca     Bilateral leg edema 02/07/2021   Cellulitis     Cellulitis and abscess 01/26/2014   Cellulitis of right foot 04/17/2021   Dental abscess 01/26/2014   Diabetes mellitus type 2 in obese     Diabetic foot infection (HCC) 04/08/2021   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 2 diabetes mellitus, limited to breakdown of skin (HCC)     Facial cellulitis 01/26/2014   HTN (hypertension)     Hypokalemia 01/28/2014   Infected dental carries 01/26/2014   Leukocytosis 01/27/2014   Osteomyelitis (HCC) 04/14/2021   Right foot infection        Has the patient had major surgery during 100 days prior to admission? Yes   Family History  family history is not on file.     Current Medications    Current Facility-Administered Medications:    0.9 %  sodium chloride infusion, , Intravenous, Continuous, Nadara Mustard, MD, Last Rate: 75 mL/hr at 03/15/23 1241, New Bag at 03/15/23 1241   acetaminophen (TYLENOL) tablet 325-650 mg, 325-650 mg, Oral, Q6H PRN, Nadara Mustard, MD   albuterol (PROVENTIL) (2.5 MG/3ML) 0.083% nebulizer solution 2.5 mg, 2.5 mg, Nebulization, Q6H PRN, Nadara Mustard, MD   alum & mag hydroxide-simeth  (MAALOX/MYLANTA) 200-200-20 MG/5ML suspension 15-30 mL, 15-30 mL, Oral, Q2H PRN, Nadara Mustard, MD   ascorbic acid (VITAMIN C) tablet 1,000 mg, 1,000 mg, Oral, Daily, Nadara Mustard, MD, 1,000 mg at 03/17/23 0844   aspirin EC tablet 81 mg, 81 mg, Oral, Daily, Nadara Mustard, MD, 81 mg at 03/17/23 0844   atorvastatin (LIPITOR) tablet 40 mg, 40 mg, Oral, QHS, Nadara Mustard, MD, 40 mg at 03/16/23 2224   bisacodyl (DULCOLAX) EC tablet 5 mg, 5 mg, Oral, Daily PRN, Nadara Mustard, MD   Chlorhexidine Gluconate Cloth 2 % PADS 6 each, 6 each, Topical, Daily, Nadara Mustard, MD, 6 each at 03/17/23 0846   clopidogrel (PLAVIX) tablet 75 mg, 75 mg, Oral, Daily, Nadara Mustard, MD, 75 mg at 03/17/23 0845   docusate sodium (COLACE) capsule 100 mg, 100 mg, Oral, Daily, Nadara Mustard, MD, 100 mg at 03/17/23 0845   enoxaparin (LOVENOX) injection 40 mg, 40 mg, Subcutaneous, Q24H, Nadara Mustard, MD, 40 mg at 03/17/23 0741   feeding supplement (GLUCERNA SHAKE) (GLUCERNA SHAKE) liquid 237 mL, 237 mL, Oral, TID BM, Nadara Mustard, MD, 237 mL at 03/17/23 1359   gabapentin (NEURONTIN) capsule 100 mg, 100 mg, Oral, TID, Nadara Mustard, MD, 100 mg at 03/17/23 1504   guaiFENesin-dextromethorphan (ROBITUSSIN DM) 100-10 MG/5ML syrup 15 mL, 15 mL, Oral, Q4H PRN, Nadara Mustard, MD   hydrALAZINE (APRESOLINE) injection 5 mg, 5 mg, Intravenous, Q20 Min PRN, Nadara Mustard, MD   HYDROmorphone (DILAUDID) injection 0.5-1 mg, 0.5-1 mg, Intravenous, Q4H PRN, Nadara Mustard, MD, 1 mg at 03/15/23 2353   insulin aspart (novoLOG) injection 0-5 Units, 0-5 Units, Subcutaneous, QHS, Nadara Mustard, MD   insulin aspart (novoLOG) injection 0-9 Units, 0-9 Units, Subcutaneous, TID WC, Nadara Mustard, MD, 1 Units at 03/17/23 1619   insulin glargine-yfgn (SEMGLEE) injection 5 Units, 5 Units, Subcutaneous, QHS,  Nadara Mustard, MD, 5 Units at 03/16/23 2223   labetalol (NORMODYNE) injection 10 mg, 10 mg, Intravenous, Q10 min PRN, Nadara Mustard,  MD   magnesium citrate solution 1 Bottle, 1 Bottle, Oral, Once PRN, Nadara Mustard, MD   magnesium sulfate IVPB 2 g 50 mL, 2 g, Intravenous, Daily PRN, Nadara Mustard, MD   metoprolol tartrate (LOPRESSOR) injection 2-5 mg, 2-5 mg, Intravenous, Q2H PRN, Nadara Mustard, MD   multivitamin with minerals tablet 1 tablet, 1 tablet, Oral, Daily, Nadara Mustard, MD, 1 tablet at 03/17/23 0844   mupirocin ointment (BACTROBAN) 2 % 1 Application, 1 Application, Nasal, BID, Nadara Mustard, MD, 1 Application at 03/17/23 0845   nutrition supplement (JUVEN) (JUVEN) powder packet 1 packet, 1 packet, Oral, BID BM, Nadara Mustard, MD, 1 packet at 03/17/23 1359   ondansetron (ZOFRAN) injection 4 mg, 4 mg, Intravenous, Q6H PRN, Nadara Mustard, MD   Oral care mouth rinse, 15 mL, Mouth Rinse, PRN, Hughie Closs, MD   oxyCODONE (Oxy IR/ROXICODONE) immediate release tablet 10-15 mg, 10-15 mg, Oral, Q4H PRN, Nadara Mustard, MD, 15 mg at 03/16/23 2224   oxyCODONE (Oxy IR/ROXICODONE) immediate release tablet 5-10 mg, 5-10 mg, Oral, Q4H PRN, Nadara Mustard, MD, 10 mg at 03/17/23 0844   pantoprazole (PROTONIX) EC tablet 40 mg, 40 mg, Oral, Daily, Nadara Mustard, MD, 40 mg at 03/17/23 0844   phenol (CHLORASEPTIC) mouth spray 1 spray, 1 spray, Mouth/Throat, PRN, Nadara Mustard, MD   pneumococcal 20-valent conjugate vaccine (PREVNAR 20) injection 0.5 mL, 0.5 mL, Intramuscular, Tomorrow-1000, Nadara Mustard, MD   polyethylene glycol (MIRALAX / GLYCOLAX) packet 17 g, 17 g, Oral, Daily PRN, Nadara Mustard, MD   sodium chloride flush (NS) 0.9 % injection 3 mL, 3 mL, Intravenous, Q12H, Nadara Mustard, MD, 3 mL at 03/17/23 0846   zinc sulfate capsule 220 mg, 220 mg, Oral, Daily, Nadara Mustard, MD, 220 mg at 03/17/23 0844   Patients Current Diet:  Diet Order                  Diet Carb Modified Fluid consistency: Thin; Room service appropriate? Yes  Diet effective now                         Precautions /  Restrictions Precautions Precautions: Fall Precaution Comments: wound on right foot as well (dressing noted) Other Brace: L LE limb protector Restrictions Weight Bearing Restrictions: Yes LLE Weight Bearing: Non weight bearing    Has the patient had 2 or more falls or a fall with injury in the past year?Yes   Prior Activity Level Limited Community (1-2x/wk): gets out of house ~2 days/week, drives   Prior Functional Level Prior Function Prior Level of Function : Independent/Modified Independent Mobility Comments: pt reports mod indep with use of RW and wheelchair. states he gets out 1x/wk using moped to run errands. primarily sedentary, reports "sitting in the sunshine" as his hobby ADLs Comments: reports mod indep with ADL/iADLs. 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  Independent   Indoor Mobility: Did the patient need assistance with walking from room to room (with or without device)? Independent   Stairs: Did the patient need assistance with internal or external stairs (with or without device)? Unknown )Pt reports he avoids stairs)   Functional Cognition: Did the  patient need help planning regular tasks such as shopping or remembering to take medications? Independent   Patient Information Are you of Hispanic, Latino/a,or Spanish origin?: A. No, not of Hispanic, Latino/a, or Spanish origin What is your race?: B. Black or African American Do you need or want an interpreter to communicate with a doctor or health care staff?: 0. No   Patient's Response To:  Health Literacy and Transportation Is the patient able to respond to health literacy and transportation needs?: Yes Health Literacy - How often do you need to have someone help you when you read instructions, pamphlets, or other written material from your doctor or pharmacy?: Never In the past 12 months, has lack of transportation kept you from  medical appointments or from getting medications?: Yes In the past 12 months, has lack of transportation kept you from meetings, work, or from getting things needed for daily living?: Yes   Home Assistive Devices / Equipment Home Assistive Devices/Equipment: None Home Equipment: Wheelchair - manual, Agricultural consultant (2 wheels)   Prior Device Use: Indicate devices/aids used by the patient prior to current illness, exacerbation or injury? Manual wheelchair and Walker   Current Functional Level Cognition   Overall Cognitive Status: No family/caregiver present to determine baseline cognitive functioning Current Attention Level: Selective Orientation Level: Oriented X4 General Comments: pleasant and agreeable to mobility. pt follows simple cues well with slight extra time. overall mildly flat affect and appears to have slightly reduced awareness of overall health/condition.    Extremity Assessment (includes Sensation/Coordination)   Upper Extremity Assessment: Overall WFL for tasks assessed  Lower Extremity Assessment: Defer to PT evaluation RLE Deficits / Details: LE edema, wrapped in gauze at foot/heel. RLE Sensation: history of peripheral neuropathy LLE Deficits / Details: limbn protector in place LLE Sensation: history of peripheral neuropathy     ADLs   Overall ADL's : Needs assistance/impaired Eating/Feeding: Independent, Sitting Grooming: Wash/dry hands, Wash/dry face, Oral care, Sitting, Set up Upper Body Bathing: Minimal assistance, Sitting Lower Body Bathing: Minimal assistance, Sitting/lateral leans Upper Body Dressing : Set up, Sitting Lower Body Dressing: Moderate assistance, Sitting/lateral leans Toilet Transfer: Min guard, BSC/3in1 Toilet Transfer Details (indicate cue type and reason): simulated lateral scoot to recliner Toileting- Clothing Manipulation and Hygiene: Minimal assistance, Sitting/lateral lean     Mobility   Overal bed mobility: Modified  Independent General bed mobility comments: NT patient received in recliner, not ready to get back into bed yet     Transfers   Overall transfer level: Needs assistance Equipment used: Rolling walker (2 wheels) Transfers: Sit to/from Stand Sit to Stand: Mod assist Bed to/from chair/wheelchair/BSC transfer type:: Lateral/scoot transfer  Lateral/Scoot Transfers: Min guard General transfer comment: attempted sit to stand transfer from recliner. Patient hesitant and was unable to get fully standing. He is able to raise bottom from recliner. Cues for hand placement, and technique. Patient may have improved confidence and success with parallel bars going forward.     Ambulation / Gait / Stairs / Wheelchair Mobility   Ambulation/Gait General Gait Details: unable     Posture / Balance Balance Overall balance assessment: Needs assistance Sitting-balance support: Feet supported Sitting balance-Leahy Scale: Good     Special needs/care consideration Continuous Drip IV  0.9% sodium chloride infusion, Wound Vac leg/left, Skin Erythema/Redness: buttocks/bilateral: Surgical incision: leg/left; Diabetic ulcer: heel, foot/right, MASD-buttocks/bilateral, and Diabetic management Semglee 5 units daily at bedtime; Novolog 0-5 units daily at bedtime; Novolog 0-9 units 3x daily with meals  Previous Home Environment (from acute therapy documentation) Living Arrangements: Alone Available Help at Discharge: Family, Available PRN/intermittently (pt will be by self the majority of the time. Family will occassionally be able to help.) Type of Home: Apartment Home Layout: One level Home Access: Stairs to enter Entrance Stairs-Rails: None Entrance Stairs-Number of Steps: 1 Bathroom Shower/Tub: Engineer, manufacturing systems: Standard Bathroom Accessibility: Yes How Accessible: Accessible via walker Home Care Services: No Additional Comments: inconsistent info regarding home set-up. reports typically  there alone, but cousin able to assist some if needed   Discharge Living Setting Plans for Discharge Living Setting: Patient's home Type of Home at Discharge: Apartment Discharge Home Layout: One level Discharge Home Access: Stairs to enter Entrance Stairs-Rails: None Entrance Stairs-Number of Steps: 1 Discharge Bathroom Shower/Tub: Tub/shower unit Discharge Bathroom Toilet: Standard Discharge Bathroom Accessibility: Yes How Accessible: Accessible via walker Does the patient have any problems obtaining your medications?: No   Social/Family/Support Systems Anticipated Caregiver: Self with sister [name] helping occasionally Caregiver Availability: Intermittent Discharge Plan Discussed with Primary Caregiver: Yes Is Caregiver In Agreement with Plan?: Yes Does Caregiver/Family have Issues with Lodging/Transportation while Pt is in Rehab?: No     Goals Patient/Family Goal for Rehab: Mod I wheelchair level:PT/OT Expected length of stay: 7-8 days Pt/Family Agrees to Admission and willing to participate: Yes Program Orientation Provided & Reviewed with Pt/Caregiver Including Roles  & Responsibilities: Yes     Decrease burden of Care through IP rehab admission: NA     Possible need for SNF placement upon discharge:Not anticipated     Patient Condition: This patient's condition remains as documented in the consult dated 03/17/23, in which the Rehabilitation Physician determined and documented that the patient's condition is appropriate for intensive rehabilitative care in an inpatient rehabilitation facility. Will admit to inpatient rehab 03/18/23.   Preadmission Screen Completed By:  Domingo Pulse, CCC-SLP, 03/17/2023 4:45 PM with updates by Megan Salon, MS, CCC-SLP   ______________________________________________________________________   Discussed status with Dr. Riley Kill on5/7/24 at 930  and received approval for admission today.   Admission Coordinator:  Domingo Pulse,  [id number] /Date5/7/24

## 2023-03-18 NOTE — Progress Notes (Signed)
DISCHARGE NOTE HOME Jesse [name] to be discharged [name] per MD order. Discussed prescriptions and follow up appointments with the patient and staff in rehab Bari Mantis, RN) medication list explained in detail. Patient verbalized understanding.  Skin clean, dry and intact without evidence of skin break down, no evidence of skin tears noted. IV catheter discontinued intact. Site without signs and symptoms of complications. Dressing and pressure applied. Pt denies pain at the site currently. No complaints noted.  Patient free of lines, drains, and wounds.   An After Visit Summary (AVS) was printed and given to the patient. Pt. Transferred to rehab room 410  Margarita Grizzle, RN

## 2023-03-18 NOTE — Progress Notes (Signed)
Inpatient Rehab Admissions Coordinator:    I have a CIR bed for this Pt. RN may call report to [phone].   Pt. In agreement to dc to CIR for intensive rehab program with goal of discharging home alone at modified independent level.  Megan Salon, MS, CCC-SLP Rehab Admissions Coordinator  [phone] (celll) [phone] (office)

## 2023-03-18 NOTE — Plan of Care (Signed)
Wound Plan  Wounds present: right heel neuropathic ulcer and right foot wound.  Of note, patient underwent a BKA of the left extremity this morning with Dr. Lajoyce Corners and will follow up with him in the office. s   Braden Score: 14 Sensory: 2 Moisture: 3 Activity: 2 Mobility: 2 Nutrition: 3 Friction: 2  Interventions: Wound care to right heel neuropathic ulcer and right foot wound: Wash with soap and water, rinse and dry. Paint wounds with povidone-iodine swabsticks, allow to dry. When dry, cover wounds with dry dressing and secure with Kerlix roll gauze/paper tape. Place foot into Prevalon boot.  Prevalon boot is to be worn at all times when in bed or chair.   Patient on CMM, thin liquid diet; eating 75-100% of meals  Zinc and Vitamin C and MVI Juven twice a day   Ladona Mow, MSN, RN, CNS, GNP, CWOCN, CWON-AP, WOCNF, FAAN  WOC :Recommend Dr. Lajoyce Corners consider seeing for these ulcerations post discharge at those appointment  Contributors: Mauro Kaufmann, RN [facility] Chana Bode, RN, CRRN

## 2023-03-18 NOTE — Progress Notes (Signed)
Patient ID: Jesse [name], male   DOB: Mar 25, 1961, 62 y.o.   MRN: [id number] Patient is postoperative day 3 left below-knee amputation.  Patient is much more alert and oriented.  There is no drainage in the wound VAC canister.  Plan for inpatient versus outpatient rehab.
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
## 2023-03-19 DIAGNOSIS — E871 Hypo-osmolality and hyponatremia: Secondary | ICD-10-CM

## 2023-03-19 DIAGNOSIS — D62 Acute posthemorrhagic anemia: Secondary | ICD-10-CM

## 2023-03-19 DIAGNOSIS — L97509 Non-pressure chronic ulcer of other part of unspecified foot with unspecified severity: Secondary | ICD-10-CM

## 2023-03-19 DIAGNOSIS — E11621 Type 2 diabetes mellitus with foot ulcer: Secondary | ICD-10-CM

## 2023-03-19 DIAGNOSIS — M869 Osteomyelitis, unspecified: Secondary | ICD-10-CM | POA: Diagnosis not present

## 2023-03-19 DIAGNOSIS — D72829 Elevated white blood cell count, unspecified: Secondary | ICD-10-CM

## 2023-03-19 DIAGNOSIS — Z794 Long term (current) use of insulin: Secondary | ICD-10-CM

## 2023-03-19 DIAGNOSIS — R799 Abnormal finding of blood chemistry, unspecified: Secondary | ICD-10-CM

## 2023-03-19 LAB — COMPREHENSIVE METABOLIC PANEL
ALT: 59 U/L — ABNORMAL HIGH (ref 0–44)
AST: 50 U/L — ABNORMAL HIGH (ref 15–41)
Albumin: 1.7 g/dL — ABNORMAL LOW (ref 3.5–5.0)
Alkaline Phosphatase: 164 U/L — ABNORMAL HIGH (ref 38–126)
Anion gap: 10 (ref 5–15)
BUN: 25 mg/dL — ABNORMAL HIGH (ref 6–20)
CO2: 20 mmol/L — ABNORMAL LOW (ref 22–32)
Calcium: 8.5 mg/dL — ABNORMAL LOW (ref 8.9–10.3)
Chloride: 103 mmol/L (ref 98–111)
Creatinine, Ser: 1.14 mg/dL (ref 0.61–1.24)
GFR, Estimated: >60 mL/min (ref >60)
Glucose, Bld: 109 mg/dL — ABNORMAL HIGH (ref 70–99)
Potassium: 4 mmol/L (ref 3.5–5.1)
Sodium: 133 mmol/L — ABNORMAL LOW (ref 135–145)
Total Bilirubin: 0.4 mg/dL (ref 0.3–1.2)
Total Protein: 7.4 g/dL (ref 6.5–8.1)

## 2023-03-19 LAB — CBC WITH DIFFERENTIAL/PLATELET
Abs Immature Granulocytes: 0.08 10*3/uL — ABNORMAL HIGH (ref 0.00–0.07)
Basophils Absolute: 0 10*3/uL (ref 0.0–0.1)
Basophils Relative: 0 %
Eosinophils Absolute: 0.4 10*3/uL (ref 0.0–0.5)
Eosinophils Relative: 3 %
HCT: 27.2 % — ABNORMAL LOW (ref 39.0–52.0)
Hemoglobin: 8.9 g/dL — ABNORMAL LOW (ref 13.0–17.0)
Immature Granulocytes: 1 %
Lymphocytes Relative: 14 %
Lymphs Abs: 1.9 10*3/uL (ref 0.7–4.0)
MCH: 32 pg (ref 26.0–34.0)
MCHC: 32.7 g/dL (ref 30.0–36.0)
MCV: 97.8 fL (ref 80.0–100.0)
Monocytes Absolute: 1.7 10*3/uL — ABNORMAL HIGH (ref 0.1–1.0)
Monocytes Relative: 12 %
Neutro Abs: 9.4 10*3/uL — ABNORMAL HIGH (ref 1.7–7.7)
Neutrophils Relative %: 70 %
Platelets: 424 10*3/uL — ABNORMAL HIGH (ref 150–400)
RBC: 2.78 MIL/uL — ABNORMAL LOW (ref 4.22–5.81)
RDW: 14 % (ref 11.5–15.5)
WBC: 13.5 10*3/uL — ABNORMAL HIGH (ref 4.0–10.5)
nRBC: 0 % (ref 0.0–0.2)

## 2023-03-19 LAB — GLUCOSE, CAPILLARY
Glucose-Capillary: 113 mg/dL — ABNORMAL HIGH (ref 70–99)
Glucose-Capillary: 132 mg/dL — ABNORMAL HIGH (ref 70–99)
Glucose-Capillary: 141 mg/dL — ABNORMAL HIGH (ref 70–99)
Glucose-Capillary: 169 mg/dL — ABNORMAL HIGH (ref 70–99)

## 2023-03-19 NOTE — Progress Notes (Signed)
[facility] Individual Statement of Services  Patient Name:  Jesse [name]  Date:  03/19/2023  Welcome to the [facility].  Our goal is to provide you with an individualized program based on your diagnosis and situation, designed to meet your specific needs.  With this comprehensive rehabilitation program, you will be expected to participate in at least 3 hours of rehabilitation therapies Monday-Friday, with modified therapy programming on the weekends.  Your rehabilitation program will include the following services:  Physical Therapy (PT), Occupational Therapy (OT), 24 hour per day rehabilitation nursing, Therapeutic Recreaction (TR), Neuropsychology, Care Coordinator, Rehabilitation Medicine, Nutrition Services, and Pharmacy Services  Weekly team conferences will be held on Wednesday to discuss your progress.  Your Inpatient Rehabilitation Care Coordinator will talk with you frequently to get your input and to update you on team discussions.  Team conferences with you and your family in attendance may also be held.  Expected length of stay: 10-12 days  Overall anticipated outcome: mod/I wheelchair level  Depending on your progress and recovery, your program may change. Your Inpatient Rehabilitation Care Coordinator will coordinate services and will keep you informed of any changes. Your Inpatient Rehabilitation Care Coordinator's name and contact numbers are listed  below.  The following services may also be recommended but are not provided by the Inpatient Rehabilitation Center:   Home Health Rehabiltiation Services Outpatient Rehabilitation Services    Arrangements will be made to provide these services after discharge if needed.  Arrangements include referral to agencies that provide these services.  Your insurance has been verified to be:  Medicaid [employer] Your primary doctor is:  Gwinda Passe  Pertinent information will be  shared with your doctor and your insurance company.  Inpatient Rehabilitation Care Coordinator:  Dossie Der, [place] [phone] or [phone]  Information discussed with and copy given to patient by: Lucy Chris, 03/19/2023, 10:20 AM

## 2023-03-19 NOTE — Discharge Summary (Incomplete)
Physician Discharge Summary  Patient ID: Jesse [name] MRN: [name] DOB/AGE: 1960-12-24 62 y.o.  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 No intravenous contrast was administered.   COMPARISON:  Radiograph 03/26/2023, MRI 03/03/2023   FINDINGS: Bones/Joint/Cartilage   There is minimal marrow edema signal within the plantar posterior calcaneus, similar to prior exam (sagittal stir images 15-19). Preserved T1 marrow signal. The cortex is intact. There is no evidence of acute fracture. Mild degenerative change in the midfoot with dorsal spurring. Plantar and dorsal calcaneal spurring.   Ligaments   Intact medial and lateral ankle ligaments with chronic deep deltoid sprain. Intact high ankle ligaments.   Muscles and Tendons   Ruptured plantar fascia in the plantar midfoot, unchanged from prior exam. Thickening of the proximal plantar fascia near the calcaneal insertion.   Soft tissues   There is a plantar heel soft tissue wound which extends to within 6 mm of the proximal plantar fascia. There is extensive adjacent edema signal without well-defined/organized  collection. Additional plantar ulcer more distally and another along the lateral midfoot with underlying soft tissue swelling but no organized fluid collection. There is focal susceptibility artifact in the medial midfoot corresponding to a stable seen on recent radiograph.   IMPRESSION: Plantar soft tissue ulcers, largest along the plantar heel, extending to within 6 mm of the underlying proximal plantar fascia. Minimal subcortical marrow edema in the posterior plantar calcaneus, similar to prior exam, favored to reflect reactive marrow change. No definitive osteomyelitis. No evidence of an organized soft tissue abscess.     Electronically Signed   By: Caprice Renshaw M.D.   On: 03/26/2023 13:32   arrative & Impression  CLINICAL DATA:  Left foot ulcer. Open sore on plantar surface of the right heel.   EXAM: RIGHT FOOT COMPLETE - 3+ VIEW   COMPARISON:  None Available.   FINDINGS: There is no evidence of fracture or dislocation. There is no cortical erosion or periosteal reaction. Plantar calcaneal spurring. Mild arthritic changes of the interphalangeal joint of the first digit. Skin irregularity about the plantar aspect of the foot consistent with history of non healing foot ulcer. Generalized soft tissue swelling about the dorsum of the foot.   IMPRESSION: 1. No radiographic evidence of osteomyelitis. 2. Skin irregularity about the plantar aspect of the foot consistent with history of non healing foot ulcer. Generalized soft tissue edema.     Electronically Signed   By: Larose Hires D.O.   On: 03/26/2023 08:33   Labs:  Basic Metabolic Panel: Recent Labs  Lab 03/27/23 0658 03/30/23 0532 03/31/23 1116  NA 136 135 137  K 4.4 4.2 4.3  CL 105 105 106  CO2 23 22 22   GLUCOSE 120* 166* 193*  BUN 35* 40* 33*  CREATININE 0.99 1.10 0.94  CALCIUM 9.2 8.7* 9.3  MG  --   --  1.6*    CBC: Recent Labs  Lab 03/30/23 0532 03/31/23 1116 04/02/23 0554  WBC 9.0 10.3  10.0  NEUTROABS 5.5  --   --   HGB 7.0* 8.1* 7.1*  HCT 21.9* 26.3* 22.2*  MCV 99.5 102.3* 98.7  PLT 376 400 340    CBG: Recent Labs  Lab 03/31/23 2051 04/01/23 0642 04/01/23 1151 04/01/23 1635 04/01/23 2237  GLUCAP 162* 117* 145* 159* 187*   Component 4 d ago  Specimen Description TISSUE  Special Requests FOOT RIGHT  Gram Stain FEW WBC PRESENT, PREDOMINANTLY PMN ABUNDANT GRAM POSITIVE COCCI IN PAIRS IN CHAINS IN CLUSTERS ABUNDANT GRAM NEGATIVE RODS FEW GRAM NEGATIVE COCCOBACILLI Performed at [facility], [address], [place], [place] [address]  Culture  Abnormal  MULTIPLE ORGANISMS PRESENT, NONE PREDOMINANT NO STAPHYLOCOCCUS AUREUS ISOLATED NO GROUP A STREP (S.PYOGENES) ISOLATED MIXED ANAEROBIC FLORA PRESENT.  CALL LAB IF FURTHER IID REQUIRED.   Report Status 03/31/2023 FINAL  Resulting Agency [facility]               Brief HPI:   Jesse [name] is a 62 y.o. male with a chronic left ankle wound managed as an outpatient by Dr. Lajoyce Corners. He presented to the emergency department on 03/12/2023 complaining of increased pain and bloody drainage from his left foot. He has a history of chronic bilateral lower extremity diabetic ulcers, peripheral vascular disease, lymphedema and vascular insufficiency. He had recently been hospitalized due to gangrene of the left foot and MRI from 4/27 was consistent with osteomyelitis. Initially declined amputation. Antibiotics were reinitiated including vancomycin and Zosyn. Orthopedic surgery consulted. He was taken to the operating room on 5/04 and underwent left below-knee amputation and placement of Prevena incisional dressing. Blood sugars are currently controlled on sliding scale insulin and Semglee. Chronic right foot wound also evaluated and negative for osteomyelitis. Offload with Prevalon boots. His A1c was last checked on 11/21/2022 and was 6.9%. He is status post right anterior tibial angioplasty by Dr. Chestine Spore on 4/25 and  continues on aspirin, Plavix and statin. He has a history of chronic hyponatremia with baseline values of 1 31-1 34. Acute blood loss anemia atop chronic anemia.    Hospital Course: Jesse [name] was admitted to rehab 03/18/2023 for inpatient therapies to consist of PT, ST and OT at least three hours five days a week. Past admission physiatrist, therapy team and rehab RN have worked together to provide customized collaborative inpatient rehab. Follow-up labs on 5/08: no change in serum sodium of 133 or in BUN of 25. Elevated liver enzymes. Leukocytosis essentially unchanged. H and H with slight improvement to 8.9/27.2. Elevated LFTs and statin  held for two doses and Tylenol limited to q 6 hours prn. ARAFO ordered for right foot/ankle. Darco shoe while ambulating. Plantar heels ulcers X two on right. One is granulating and the other has fibrinous exudate present. Local wound care continued. Neuropsychology consultation on 5/10. Prevena incisional dressing removed on 5/11 and shrinker sock placed. On 5/13, right foot dressing removed and noted that patient has boggy, fibronecrotic tissue on his heel-->he reports that he stepped on something which has caused disruption of the skin graft that he had a couple of years ago. Reached out to Dr. Lajoyce Corners with question of need for debridement and/or antibiotics for wound management. Have concerns about patient's ability to manage wound independently after discharge. Doxycycline initiated. Foot x-ray obtained. MRI ordered by Dr. Lajoyce Corners. MRI scan does not show definite osteomyelitis.  Clinically this extends down to bone. On 5/17, he underwent right heel excisional debridement, cultures obtained and wound VAC applied. SNF sought.  LFTs normal on 5/16. Hemoglobin down to 7.0 on 5/19 and patient refused transfusion. Aspirin and Plavix discontinued on 5/20. Hgb up to 8.1. Platelets down to 400k and serum magnesium 1.6 on 5/20. Given Slow-mag.  Dr. Lajoyce Corners removed wound VAC 5/21: "The  heel ulcer is stable. Patient will need dry dressing changes daily to the right heel ulcer. He will continue nonweightbearing on the right." Continue doxycycline and Levaquin and follow-up as outpatient with Dr. Lajoyce Corners.  Blood pressures were monitored on TID basis and remained stable.  Diabetes has been monitored with ac/hs CBG checks and SSI was use prn for tighter BS control. Semglee 5 units q HS continued. Bedtime insulin coverage d/c 5/11 given good control.   Left BKA and  Right foot prior to debridement         Rehab course: During patient's stay in rehab weekly team conferences were held to monitor patient's progress, set goals and discuss barriers to discharge. At admission, patient required  min with mobility and with basic self-care skills.   He  has had improvement in activity tolerance, balance, postural control as well as ability to compensate for deficits. He has had improvement in functional use RUE/LUE  and RLE/LLE as well as improvement in awareness. Pt donned L LE limb protector with mod I. Pt performed slide board bed to WC with mod I.  Pt requires verbal questioning cue for reminder to elevate leg rests on power wheelchair.  Pt performed slideboard transfer to car with mod I. Pt performed slideboard transfer power wheelchair to mat table with mod I. Discharge to SNF.    Discharge disposition: 03-Skilled Nursing Facility     Diet: carb modified  Special Instructions: No driving, alcohol consumption or tobacco use.  Needs follow-up CBC in 3-5 days to follow-up anemia.  Non-weight bearing on right lower extremity.  Discharge Instructions     Discharge patient   Complete by: As directed    Discharge disposition: 03-Skilled Nursing Facility   Discharge patient date: 04/02/2023      Allergies as of 04/02/2023   No Known Allergies      Medication List     STOP taking these medications    docusate sodium 100 MG capsule Commonly known as: COLACE   insulin  glargine 100 UNIT/ML Solostar Pen Commonly known as: LANTUS   oxyCODONE-acetaminophen 5-325 MG tablet Commonly known as: Percocet       TAKE these medications    acetaminophen 325 MG tablet Commonly known as: TYLENOL Take 1-2 tablets (325-650 mg total) by mouth every 6 (six) hours as needed for mild pain. What changed:  how much to take when to take this reasons to take this   artificial tears Oint ophthalmic ointment Commonly known as: LACRILUBE Place into the right eye every 4 (four) hours as needed for dry eyes.   ascorbic acid 1000 MG tablet Commonly known as: VITAMIN C Take 1 tablet (1,000 mg total) by mouth daily.   Aspirin Low Dose 81 MG tablet Generic drug: aspirin EC Take 1 tablet (81 mg total) by mouth daily. Swallow whole.   atorvastatin 40 MG tablet Commonly known as: LIPITOR Take 1 tablet (40 mg total) by mouth at bedtime.   clopidogrel 75 MG tablet Commonly known as: Plavix Take 1 tablet (75 mg total) by mouth daily.   dorzolamide-timolol 2-0.5 % ophthalmic solution Commonly  known as: COSOPT Place 1 drop into the right eye 2 (two) times daily.   doxycycline 100 MG tablet Commonly known as: VIBRA-TABS Take 1 tablet (100 mg total) by mouth every 12 (twelve) hours.   feeding supplement (GLUCERNA SHAKE) Liqd Take 237 mLs by mouth 3 (three) times daily between meals.   nutrition supplement (JUVEN) Pack Take 1 packet by mouth 2 (two) times daily between meals.   gabapentin 100 MG capsule Commonly known as: NEURONTIN Take 1 capsule (100 mg total) by mouth 3 (three) times daily.   insulin aspart 100 UNIT/ML injection Commonly known as: novoLOG Inject 0-9 Units into the skin 3 (three) times daily with meals.   insulin glargine-yfgn 100 UNIT/ML injection Commonly known as: SEMGLEE Inject 0.05 mLs (5 Units total) into the skin at bedtime.   Insulin Pen Needle 32G X 4 MM Misc Use as directed with insulin pen   levofloxacin 750 MG tablet Commonly  known as: Levaquin Take 1 tablet (750 mg total) by mouth daily for 14 days.   magnesium chloride 64 MG Tbec SR tablet Commonly known as: SLOW-MAG Take 1 tablet (64 mg total) by mouth 2 (two) times daily.   multivitamin with minerals Tabs tablet Take 1 tablet by mouth daily.   oxyCODONE 5 MG immediate release tablet Commonly known as: Oxy IR/ROXICODONE Take 1 tablet (5 mg total) by mouth every 6 (six) hours as needed for severe pain (pain score 7-10).   pantoprazole 40 MG tablet Commonly known as: PROTONIX Take 1 tablet (40 mg total) by mouth daily.   polyethylene glycol 17 g packet Commonly known as: MIRALAX / GLYCOLAX Take 17 g by mouth daily as needed for mild constipation.   zinc sulfate 220 (50 Zn) MG capsule Take 1 capsule (220 mg total) by mouth daily.        Contact information for follow-up providers     Grayce Sessions, NP Follow up.   Specialty: Internal Medicine Why: Call the office make arrangements for hospital follow-up appointment. Contact information: [address] [place] [address] [phone]         Nadara Mustard, MD Follow up.   Specialty: Orthopedic Surgery Why: Call the office to make arrangements for hospital follow-up appointment and staple removal Contact information: [address] [place] [address] [phone]         Fanny Dance, MD Follow up.   Specialty: Physical Medicine and Rehabilitation Why: As needed Contact information: [address] [place] [address] [phone]              Contact information for after-discharge care     Destination     [facility] .   Service: Skilled Nursing Contact information: [address] [place] [address] [phone]                     Signed: Milinda Antis 04/02/2023, 9:44 AM

## 2023-03-19 NOTE — Evaluation (Signed)
Occupational Therapy Assessment and Plan  Patient Details  Name: Jesse [name] MRN: [id number] Date of Birth: 10/17/1961  OT Diagnosis:  L BKA Rehab Potential: Rehab Potential (ACUTE ONLY): Excellent ELOS: 10-12 days   Today's Date: 03/19/2023 OT Individual Time: 0830-1000 OT Individual Time Calculation (min): 90 min     Hospital Problem: Principal Problem:   Osteomyelitis of left lower extremity (HCC)   Past Medical History:  Past Medical History:  Diagnosis Date   AKI (acute kidney injury) (HCC) 01/27/2014   Alcohol abuse    Anasarca    Bilateral leg edema 02/07/2021   Cellulitis    Cellulitis and abscess 01/26/2014   Cellulitis of right foot 04/17/2021   Dental abscess 01/26/2014   Diabetes mellitus type 2 in obese    Diabetic foot infection (HCC) 04/08/2021   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 2 diabetes mellitus, limited to breakdown of skin (HCC)    Facial cellulitis 01/26/2014   HTN (hypertension)    Hypokalemia 01/28/2014   Infected dental carries 01/26/2014   Leukocytosis 01/27/2014   Osteomyelitis (HCC) 04/14/2021   Right foot infection    Past Surgical History:  Past Surgical History:  Procedure Laterality Date   ABDOMINAL AORTOGRAM W/LOWER EXTREMITY N/A 03/06/2023   Procedure: ABDOMINAL AORTOGRAM W/LOWER EXTREMITY;  Surgeon: Cephus Shelling, MD;  Location: [facility];  Service: Cardiovascular;  Laterality: N/A;   AMPUTATION Left 03/15/2023   Procedure: AMPUTATION BELOW KNEE;  Surgeon: Nadara Mustard, MD;  Location: [place] OR;  Service: Orthopedics;  Laterality: Left;   I & D EXTREMITY Right 04/18/2021   Procedure: IRRIGATION AND DEBRIDEMENT OF FOOT;  Surgeon: Nadara Mustard, MD;  Location: [facility] OR;  Service: Orthopedics;  Laterality: Right;   I & D EXTREMITY Right 04/20/2021   Procedure: EXCISIONAL DEBRIDEMENT RIGHT FOOT, APPLICATION OF SKIN GRAFT;  Surgeon: Nadara Mustard, MD;  Location: [facility] OR;  Service: Orthopedics;  Laterality: Right;    PERIPHERAL VASCULAR INTERVENTION  03/06/2023   Procedure: PERIPHERAL VASCULAR INTERVENTION;  Surgeon: Cephus Shelling, MD;  Location: [facility];  Service: Cardiovascular;;    Assessment & Plan Clinical Impression: Jesse [name] is a 62 y.o. male with history of DM with DPN, chronic bilateral feet ulcers, PAD who had worsening of his wounds ultimately leading to osteomyelitis and gangrene. He initially turned down a left BKA and was in and out of the hospital over a week long stretch. Ultimately due to pain and ongoing wound issues/bleeding the patient agreed to a left BKA which performed on 03/15/23 by Dr. Lajoyce Corners. He has ongoing wound issues in the right foot which is being dressed daily. Blood sugars under reasonable control. Pt working through pain. He was min assist with sit-std transfers and hasn't ambulated yet. He has been min assist with ADL's.   Patient transferred to CIR on 03/18/2023 .    Patient currently requires min with basic self-care skills secondary to muscle weakness, decreased cardiorespiratoy endurance, and decreased standing balance, decreased postural control, and decreased balance strategies.  Prior to hospitalization, patient could complete ADLs with independent .  Patient will benefit from skilled intervention to increase independence with basic self-care skills prior to discharge home independently.  Anticipate patient will require intermittent supervision and follow up home health.  OT - End of Session Activity Tolerance: Tolerates 30+ min activity with multiple rests Endurance Deficit: Yes Endurance Deficit Description: generalized weakness OT Assessment Rehab Potential (ACUTE ONLY): Excellent OT Barriers  to Discharge: Decreased caregiver support OT Barriers to Discharge Comments: only intermittent supervision OT Patient demonstrates impairments in the following area(s): Balance;Sensory;Endurance;Motor;Pain;Skin Integrity OT Basic ADL's Functional Problem(s):  Bathing;Dressing;Toileting OT Advanced ADL's Functional Problem(s): Simple Meal Preparation OT Transfers Functional Problem(s): Toilet;Tub/Shower OT Additional Impairment(s): None OT Plan OT Intensity: Minimum of 1-2 x/day, 45 to 90 minutes OT Frequency: 5 out of 7 days OT Duration/Estimated Length of Stay: 10-12 days OT Treatment/Interventions: Balance/vestibular training;Discharge planning;Self Care/advanced ADL retraining;Pain management;Therapeutic Activities;Functional mobility training;Patient/family education;Therapeutic Exercise;Wheelchair propulsion/positioning;UE/LE Strength taining/ROM;Psychosocial support;Neuromuscular re-education;Community reintegration;DME/adaptive equipment instruction OT Self Feeding Anticipated Outcome(s): no goal OT Basic Self-Care Anticipated Outcome(s): mod I OT Toileting Anticipated Outcome(s): mod I OT Bathroom Transfers Anticipated Outcome(s): mod I OT Recommendation Patient destination: Home Follow Up Recommendations: Home health OT Equipment Recommended: 3 in 1 bedside comode;Tub/shower bench Equipment Details: drop arm BSC, TTB   OT Evaluation Precautions/Restrictions  Precautions Precautions: Fall Precaution Comments: L BKA, R foot wound Other Brace: L LE limb protector Restrictions Weight Bearing Restrictions: Yes LLE Weight Bearing: Non weight bearing General Chart Reviewed: Yes Family/Caregiver Present: No    Home Living/Prior Functioning Home Living Family/patient expects to be discharged to:: Private residence Living Arrangements: Alone Available Help at Discharge: Family, Available PRN/intermittently Type of Home: Apartment Home Access: Stairs to enter Secretary/administrator of Steps: 1 Entrance Stairs-Rails: None Home Layout: One level Bathroom Shower/Tub: Engineer, manufacturing systems: Standard Bathroom Accessibility: Yes Additional Comments: Cousin will assist  Lives With: Alone IADL History Homemaking  Responsibilities: Yes Meal Prep Responsibility: Primary (just microwave) Laundry Responsibility: Primary Cleaning Responsibility: Primary Bill Paying/Finance Responsibility: Primary Shopping Responsibility: Primary Current License: Yes Mode of Transportation: Other (comment) (moped) Occupation: Unemployed Prior Function Level of Independence: Independent with basic ADLs, Independent with transfers, Requires assistive device for independence  Able to Take Stairs?: Yes Driving: Yes Vision Baseline Vision/History: 0 No visual deficits Ability to See in Adequate Light: 0 Adequate Patient Visual Report: No change from baseline Vision Assessment?: No apparent visual deficits Perception  Perception: Within Functional Limits Praxis Praxis: Intact Cognition Cognition Overall Cognitive Status: No family/caregiver present to determine baseline cognitive functioning Arousal/Alertness: Awake/alert Orientation Level: Person;Place;Situation Person: Oriented Place: Oriented Situation: Oriented Memory: Impaired Memory Impairment: Decreased recall of new information Awareness: Appears intact Problem Solving: Appears intact Safety/Judgment: Appears intact Brief Interview for Mental Status (BIMS) Repetition of Three Words (First Attempt): 3 Temporal Orientation: Year: Correct Temporal Orientation: Month: Accurate within 5 days Temporal Orientation: Day: Correct Recall: "Sock": Yes, after cueing ("something to wear") Recall: "Blue": Yes, no cue required Recall: "Bed": No, could not recall BIMS Summary Score: 12 Sensation Sensation Light Touch: Impaired Detail Central sensation comments: Difficult to assess distal end of residual limb with wound vac on but sensation seems to be intact Coordination Gross Motor Movements are Fluid and Coordinated: No Fine Motor Movements are Fluid and Coordinated: Yes Coordination and Movement Description: L BKA Finger Nose Finger Test: [facility] Motor   Motor Motor: Other (comment) Motor - Skilled Clinical Observations: L BKA, R LE wound  Trunk/Postural Assessment  Cervical Assessment Cervical Assessment: Within Functional Limits Thoracic Assessment Thoracic Assessment: Within Functional Limits Lumbar Assessment Lumbar Assessment: Within Functional Limits Postural Control Postural Control: Deficits on evaluation Righting Reactions: Delayed  Balance Balance Balance Assessed: Yes Static Sitting Balance Static Sitting - Level of Assistance: 6: Modified independent (Device/Increase time) Dynamic Sitting Balance Dynamic Sitting - Level of Assistance: 6: Modified independent (Device/Increase time) Static Standing Balance Static Standing - Balance Support: During functional activity;Bilateral upper extremity  supported Static Standing - Level of Assistance: 5: Stand by assistance Dynamic Standing Balance Dynamic Standing - Balance Support: During functional activity;Bilateral upper extremity supported Dynamic Standing - Level of Assistance: 4: Min assist Extremity/Trunk Assessment RUE Assessment RUE Assessment: Within Functional Limits LUE Assessment LUE Assessment: (P) Exceptions to [facility]  Care Tool Care Tool Self Care Eating   Eating Assist Level: Set up assist    Oral Care    Oral Care Assist Level: Set up assist    Bathing   Body parts bathed by patient: Right arm;Left arm;Chest;Abdomen;Front perineal area;Right upper leg;Left upper leg;Right lower leg;Face Body parts bathed by helper: Buttocks Body parts n/a: Left lower leg (BKA) Assist Level: Minimal Assistance - Patient > 75%    Upper Body Dressing(including orthotics)   What is the patient wearing?: Pull over shirt   Assist Level: Supervision/Verbal cueing    Lower Body Dressing (excluding footwear)   What is the patient wearing?: Pants Assist for lower body dressing: Minimal Assistance - Patient > 75%    Putting on/Taking off footwear   What is the patient  wearing?: Non-skid slipper socks Assist for footwear: Moderate Assistance - Patient 50 - 74%       Care Tool Toileting Toileting activity   Assist for toileting: Moderate Assistance - Patient 50 - 74%     Care Tool Bed Mobility Roll left and right activity   Roll left and right assist level: Supervision/Verbal cueing    Sit to lying activity   Sit to lying assist level: Supervision/Verbal cueing    Lying to sitting on side of bed activity   Lying to sitting on side of bed assist level: the ability to move from lying on the back to sitting on the side of the bed with no back support.: Supervision/Verbal cueing     Care Tool Transfers Sit to stand transfer   Sit to stand assist level: Moderate Assistance - Patient 50 - 74%    Chair/bed transfer   Chair/bed transfer assist level: Minimal Assistance - Patient > 75%     Toilet transfer   Assist Level: Minimal Assistance - Patient > 75%     Care Tool Cognition  Expression of Ideas and Wants Expression of Ideas and Wants: 4. Without difficulty (complex and basic) - expresses complex messages without difficulty and with speech that is clear and easy to understand  Understanding Verbal and Non-Verbal Content Understanding Verbal and Non-Verbal Content: 4. Understands (complex and basic) - clear comprehension without cues or repetitions   Memory/Recall Ability Memory/Recall Ability : Current season;That he or she is in a hospital/hospital unit   Refer to Care Plan for Long Term Goals  SHORT TERM GOAL WEEK 1 OT Short Term Goal 1 (Week 1): Pt will complete toileting tasks with (S) overall OT Short Term Goal 2 (Week 1): Pt will complete stand from the w/c with CGA OT Short Term Goal 3 (Week 1): Pt will complete toilet transfer using LRAD with (S)  Recommendations for other services: None    Skilled Therapeutic Intervention ADL ADL Eating: Independent Where Assessed-Eating: Bed level Grooming: Supervision/safety Where  Assessed-Grooming: Wheelchair Upper Body Bathing: Supervision/safety Where Assessed-Upper Body Bathing: Edge of bed Lower Body Bathing: Minimal assistance Where Assessed-Lower Body Bathing: Edge of bed Upper Body Dressing: Supervision/safety Where Assessed-Upper Body Dressing: Edge of bed Lower Body Dressing: Moderate assistance Where Assessed-Lower Body Dressing: Edge of bed Toileting: Moderate assistance Where Assessed-Toileting: Bedside Commode Toilet Transfer: Minimal assistance Toilet Transfer Method: Squat pivot Toilet  Transfer Equipment: Drop arm bedside commode Mobility  Bed Mobility Bed Mobility: Sit to Supine;Supine to Sit Supine to Sit: Supervision/Verbal cueing Sit to Supine: Supervision/Verbal cueing Transfers Sit to Stand: Moderate Assistance - Patient 50-74% Stand to Sit: Moderate Assistance - Patient 50-74%   Skilled OT evaluation completed with the creation of pt centered OT POC. Pt educated on condition, ELOS, rehab expectations, and fall risk reduction strategies throughout session. Pt able to stand from elevated EOB with min A, requiring heavier mod A from low w/c. Squat pivot transfer with CGA. Limb loss education provided throughout session. Pt very receptive. Inquiring re power w/c. Will f/u with CSW and vendor. Pt refused safety alarm belt at end of session but verbally agreed to not getting up without assist. Pt was left sitting up in the w/c with all needs met and call bell within reach.    Discharge Criteria: Patient will be discharged from OT if patient refuses treatment 3 consecutive times without medical reason, if treatment goals not met, if there is a change in medical status, if patient makes no progress towards goals or if patient is discharged from hospital.  The above assessment, treatment plan, treatment alternatives and goals were discussed and mutually agreed upon: by patient  Crissie Reese 03/19/2023, 10:28 AM

## 2023-03-19 NOTE — H&P (Signed)
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 Lajoyce Corners.  He presented to the emergency department on 03/12/2023 complaining of increased pain and bloody drainage from his left foot.  He has a history of chronic bilateral lower extremity diabetic ulcers, peripheral vascular disease, lymphedema and vascular insufficiency.  He had recently been hospitalized due to gangrene of the left foot and MRI from 4/27 was consistent with osteomyelitis.  Initially declined amputation.  Antibiotics were reinitiated including vancomycin and Zosyn.  Orthopedic surgery consulted.  He was taken to the operating room on 5/04 and underwent left below-knee amputation and placement of Prevena incisional dressing.  Blood sugars are currently controlled on sliding scale insulin and Semglee.  Chronic right foot wound also evaluated and negative for osteomyelitis.  Offload with Prevalon boots.  His A1c was last checked on 11/21/2022 and was 6.9%.  He is status post right anterior tibial angioplasty by Dr. Chestine Spore on 4/25 and continues on aspirin, Plavix and statin.  He has a history of chronic hyponatremia with baseline values of 1 31-1 34.  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   Lives alone. Sister in [place], brother is a Naval architect.   Review of Systems  Constitutional:  Negative for chills and fever.  HENT:  Negative for hearing loss and sore throat.   Eyes:  Negative for blurred vision and double vision.  Respiratory:  Negative for cough and shortness of breath.   Cardiovascular:  Negative for chest pain and palpitations.  Gastrointestinal:  Negative for  constipation, nausea and vomiting.  Genitourinary:  Negative for dysuria and urgency.  Musculoskeletal:  Negative for back pain and myalgias.       Post-op pain controlled  Neurological:  Negative for dizziness and headaches.  Psychiatric/Behavioral:  Negative for depression. The patient is not nervous/anxious.         Past Medical History:  Diagnosis Date   AKI (acute kidney injury) (HCC) 01/27/2014   Alcohol abuse     Anasarca     Bilateral leg edema 02/07/2021   Cellulitis     Cellulitis and abscess 01/26/2014   Cellulitis of right foot 04/17/2021   Dental abscess 01/26/2014   Diabetes mellitus type 2 in obese     Diabetic foot infection (HCC) 04/08/2021   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 2 diabetes mellitus, limited to breakdown of skin (HCC)     Facial cellulitis 01/26/2014   HTN (hypertension)     Hypokalemia 01/28/2014   Infected dental carries 01/26/2014   Leukocytosis 01/27/2014   Osteomyelitis (HCC) 04/14/2021   Right foot infection           Past Surgical History:  Procedure Laterality Date   ABDOMINAL AORTOGRAM W/LOWER EXTREMITY N/A 03/06/2023    Procedure: ABDOMINAL AORTOGRAM W/LOWER EXTREMITY;  Surgeon: Cephus Shelling, MD;  Location: [facility];  Service: Cardiovascular;  Laterality: N/A;   AMPUTATION Left 03/15/2023    Procedure: AMPUTATION BELOW KNEE;  Surgeon: Nadara Mustard, MD;  Location: [facility];  Service: Orthopedics;  Laterality: Left;   I & D EXTREMITY Right 04/18/2021    Procedure: IRRIGATION AND  DEBRIDEMENT OF FOOT;  Surgeon: Nadara Mustard, MD;  Location: [facility] OR;  Service: Orthopedics;  Laterality: Right;   I & D EXTREMITY Right 04/20/2021    Procedure: EXCISIONAL DEBRIDEMENT RIGHT FOOT, APPLICATION OF SKIN GRAFT;  Surgeon: Nadara Mustard, MD;  Location: [facility] OR;  Service: Orthopedics;  Laterality: Right;   PERIPHERAL VASCULAR INTERVENTION   03/06/2023    Procedure: PERIPHERAL VASCULAR INTERVENTION;  Surgeon: Cephus Shelling, MD;   Location: [facility];  Service: Cardiovascular;;    History reviewed. No pertinent family history. Social History:  reports that he has never smoked. He has never used smokeless tobacco. He reports that he does not currently use alcohol after a past usage of about 2.0 standard drinks of alcohol per week. He reports that he does not use drugs. Allergies: No Known Allergies       Medications Prior to Admission  Medication Sig Dispense Refill   [DISCONTINUED] insulin glargine (LANTUS) 100 UNIT/ML Solostar Pen INJECT 10 UNITS INTO THE SKIN DAILY. 6 mL 3   acetaminophen (TYLENOL) 325 MG tablet Take 2 tablets (650 mg total) by mouth every 4 (four) hours as needed for headache or mild pain. (Patient not taking: Reported on 03/12/2023)       amoxicillin-clavulanate (AUGMENTIN) 875-125 MG tablet Take 2 tablets by mouth 2 (two) times daily. (Patient not taking: Reported on 03/12/2023) 60 tablet 0   aspirin EC 81 MG tablet Take 1 tablet (81 mg total) by mouth daily. Swallow whole. (Patient not taking: Reported on 03/12/2023) 30 tablet 12   atorvastatin (LIPITOR) 40 MG tablet Take 1 tablet (40 mg total) by mouth at bedtime. (Patient not taking: Reported on 03/12/2023) 30 tablet 0   clopidogrel (PLAVIX) 75 MG tablet Take 1 tablet (75 mg total) by mouth daily. (Patient not taking: Reported on 03/12/2023) 30 tablet 11   dorzolamide-timolol (COSOPT) 2-0.5 % ophthalmic solution Place 1 drop into the right eye 2 (two) times daily. (Patient not taking: Reported on 03/12/2023) 10 mL 0   guaiFENesin (ROBITUSSIN) 100 MG/5ML liquid Take 5 mLs by mouth every 4 (four) hours as needed for cough or to loosen phlegm. (Patient not taking: Reported on 03/12/2023) 120 mL 0   Insulin Pen Needle 32G X 4 MM MISC Use as directed with insulin pen 100 each 1   Nystatin (GERHARDT'S BUTT CREAM) CREA Apply 1 Application topically 3 (three) times daily. Apply to inner thighs and buttocks liberally (Patient not taking: Reported on 11/21/2022) 30  each 2   oxyCODONE (OXY IR/ROXICODONE) 5 MG immediate release tablet Take 1 tablet (5 mg total) by mouth every 4 (four) hours as needed for moderate pain. (Patient not taking: Reported on 03/12/2023) 30 tablet 0   [DISCONTINUED] gabapentin (NEURONTIN) 100 MG capsule Take 1 capsule (100 mg total) by mouth 3 (three) times daily. (Patient not taking: Reported on 03/12/2023) 90 capsule 0          Home: Home Living Family/patient expects to be discharged to:: Private residence Living Arrangements: Alone Available Help at Discharge: Family, Available PRN/intermittently (pt will be by self the majority of the time. Family will occassionally be able to help.) Type of Home: Apartment Home Access: Stairs to enter [employer] of Steps: 1 Entrance Stairs-Rails: None Home Layout: One level Bathroom Shower/Tub: Engineer, manufacturing systems: Standard Bathroom Accessibility: Yes Home Equipment: Wheelchair - manual, Agricultural consultant (2 wheels) Additional Comments: inconsistent info regarding home set-up. reports typically there alone, but cousin able to assist some if  needed   Functional History: Prior Function Prior Level of Function : Independent/Modified Independent Mobility Comments: pt reports mod indep with use of RW and wheelchair. states he gets out 1x/wk using moped to run errands. primarily sedentary, reports "sitting in the sunshine" as his hobby ADLs Comments: reports mod indep with ADL/iADLs. 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)   Functional Status:  Mobility: Bed Mobility Overal bed mobility: Modified Independent General bed mobility comments: increased time Transfers Overall transfer level: Needs assistance Equipment used: None, Ambulation equipment used Transfers: Sit to/from Stand Sit to Stand: Mod assist, From elevated surface Bed to/from chair/wheelchair/BSC transfer type:: Lateral/scoot transfer  Lateral/Scoot Transfers: Min guard General transfer comment: mod A to  power up to stand from elevated EOB x3 into stedy frame, able to complete x10 to<>from stedy pad with min guard, min guard to laterally scoot EOB>chair at end of session Ambulation/Gait General Gait Details: unable   ADL: ADL Overall ADL's : Needs assistance/impaired Eating/Feeding: Independent, Sitting Grooming: Wash/dry hands, Wash/dry face, Oral care, Sitting, Set up Upper Body Bathing: Minimal assistance, Sitting Lower Body Bathing: Minimal assistance, Sitting/lateral leans Upper Body Dressing : Set up, Sitting Lower Body Dressing: Moderate assistance, Sitting/lateral leans Toilet Transfer: Min guard, BSC/3in1 Toilet Transfer Details (indicate cue type and reason): simulated lateral scoot to recliner Toileting- Clothing Manipulation and Hygiene: Minimal assistance, Sitting/lateral lean   Cognition: Cognition Overall Cognitive Status: No family/caregiver present to determine baseline cognitive functioning Orientation Level: Oriented X4 Cognition Arousal/Alertness: Awake/alert Behavior During Therapy: Flat affect Overall Cognitive Status: No family/caregiver present to determine baseline cognitive functioning Area of Impairment: Safety/judgement, Awareness, Problem solving Current Attention Level: Selective Problem Solving: Slow processing, Decreased initiation, Difficulty sequencing, Requires verbal cues General Comments: pleasant and agreeable to mobility. pt follows simple cues well with slight extra time. overall mildly flat affect and appears to have slightly reduced awareness of overall health/condition.   Physical Exam: Blood pressure 117/66, pulse 75, temperature 98.2 F (36.8 C), temperature source Oral, resp. rate 18, height 6' (1.829 m), weight 96.3 kg, SpO2 99 %. Physical Exam Constitutional:      General: He is not in acute distress. HENT:     Head: Normocephalic and atraumatic.     Comments: Poor dentition    Nose: Nose normal.  Eyes:     Extraocular  Movements: Extraocular movements intact.     Pupils: Pupils are equal, round, and reactive to light.  Cardiovascular:     Rate and Rhythm: Normal rate and regular rhythm.     Heart sounds: No murmur heard. Pulmonary:     Effort: Pulmonary effort is normal.     Breath sounds: Normal breath sounds.  Abdominal:     General: Bowel sounds are normal.     Palpations: Abdomen is soft.  Musculoskeletal:     Right lower leg: Edema present.     Comments: Right foot/ankle dressing intact; Prevalon boot in place. Prevena dressing to left residual limb with good seal.   Skin:    Comments: Right heel ulcer  Neurological:     General: No focal deficit present.     Mental Status: He is alert and oriented to person, place, and time.     Comments: Alert and oriented x 3. Normal insight and awareness. Intact Memory. Normal language and speech. Cranial nerve exam unremarkable. MMT: 5/5 BLE, RLE grossly 4/5 prox to distal with some limitations distally. LLE limited by vac but grossly 3-4/5. Decreased LT RLE below mid calf. Marland Kitchen  Psychiatric:        Mood and Affect: Mood normal.        Behavior: Behavior normal.        Lab Results Last 48 Hours        Results for orders placed or performed during the hospital encounter of 03/12/23 (from the past 48 hour(s))  Glucose, capillary     Status: Abnormal    Collection Time: 03/16/23  4:16 PM  Result Value Ref Range    Glucose-Capillary 120 (H) 70 - 99 mg/dL      Comment: Glucose reference range applies only to samples taken after fasting for at least 8 hours.  Glucose, capillary     Status: Abnormal    Collection Time: 03/16/23  9:21 PM  Result Value Ref Range    Glucose-Capillary 150 (H) 70 - 99 mg/dL      Comment: Glucose reference range applies only to samples taken after fasting for at least 8 hours.  Glucose, capillary     Status: None    Collection Time: 03/17/23  7:20 AM  Result Value Ref Range    Glucose-Capillary 91 70 - 99 mg/dL      Comment:  Glucose reference range applies only to samples taken after fasting for at least 8 hours.  Renal function panel     Status: Abnormal    Collection Time: 03/17/23  9:02 AM  Result Value Ref Range    Sodium 135 135 - 145 mmol/L    Potassium 4.5 3.5 - 5.1 mmol/L    Chloride 104 98 - 111 mmol/L    CO2 21 (L) 22 - 32 mmol/L    Glucose, Bld 150 (H) 70 - 99 mg/dL      Comment: Glucose reference range applies only to samples taken after fasting for at least 8 hours.    BUN 27 (H) 6 - 20 mg/dL    Creatinine, Ser 4.09 0.61 - 1.24 mg/dL    Calcium 8.5 (L) 8.9 - 10.3 mg/dL    Phosphorus 2.8 2.5 - 4.6 mg/dL    Albumin 1.6 (L) 3.5 - 5.0 g/dL    GFR, Estimated >81 >19 mL/min      Comment: (NOTE) Calculated using the CKD-EPI Creatinine Equation (2021)      Anion gap 10 5 - 15      Comment: Performed at [facility], [address] [place], [place] [address]  CBC with Differential/Platelet     Status: Abnormal    Collection Time: 03/17/23  9:02 AM  Result Value Ref Range    WBC 15.1 (H) 4.0 - 10.5 K/uL    RBC 2.69 (L) 4.22 - 5.81 MIL/uL    Hemoglobin 8.6 (L) 13.0 - 17.0 g/dL    HCT 95.6 (L) 21.3 - 52.0 %    MCV 99.3 80.0 - 100.0 fL    MCH 32.0 26.0 - 34.0 pg    MCHC 32.2 30.0 - 36.0 g/dL    RDW 08.6 57.8 - 46.9 %    Platelets 382 150 - 400 K/uL    nRBC 0.0 0.0 - 0.2 %    Neutrophils Relative % 76 %    Neutro Abs 11.4 (H) 1.7 - 7.7 K/uL    Lymphocytes Relative 10 %    Lymphs Abs 1.6 0.7 - 4.0 K/uL    Monocytes Relative 11 %    Monocytes Absolute 1.6 (H) 0.1 - 1.0 K/uL    Eosinophils Relative 2 %    Eosinophils Absolute 0.3  0.0 - 0.5 K/uL    Basophils Relative 0 %    Basophils Absolute 0.0 0.0 - 0.1 K/uL    Immature Granulocytes 1 %    Abs Immature Granulocytes 0.18 (H) 0.00 - 0.07 K/uL      Comment: Performed at [facility], [address], [place], [place] [address]  Magnesium     Status: Abnormal    Collection Time: 03/17/23  9:02 AM  Result Value Ref Range     Magnesium 1.6 (L) 1.7 - 2.4 mg/dL      Comment: Performed at [facility], [address] [place], [place] [address]  Glucose, capillary     Status: Abnormal    Collection Time: 03/17/23 11:33 AM  Result Value Ref Range    Glucose-Capillary 140 (H) 70 - 99 mg/dL      Comment: Glucose reference range applies only to samples taken after fasting for at least 8 hours.  Glucose, capillary     Status: Abnormal    Collection Time: 03/17/23  4:16 PM  Result Value Ref Range    Glucose-Capillary 128 (H) 70 - 99 mg/dL      Comment: Glucose reference range applies only to samples taken after fasting for at least 8 hours.  Glucose, capillary     Status: Abnormal    Collection Time: 03/17/23  9:05 PM  Result Value Ref Range    Glucose-Capillary 164 (H) 70 - 99 mg/dL      Comment: Glucose reference range applies only to samples taken after fasting for at least 8 hours.  Glucose, capillary     Status: Abnormal    Collection Time: 03/18/23  7:07 AM  Result Value Ref Range    Glucose-Capillary 101 (H) 70 - 99 mg/dL      Comment: Glucose reference range applies only to samples taken after fasting for at least 8 hours.  CBC with Differential/Platelet     Status: Abnormal    Collection Time: 03/18/23  8:50 AM  Result Value Ref Range    WBC 13.9 (H) 4.0 - 10.5 K/uL    RBC 2.61 (L) 4.22 - 5.81 MIL/uL    Hemoglobin 8.3 (L) 13.0 - 17.0 g/dL    HCT 09.8 (L) 11.9 - 52.0 %    MCV 101.1 (H) 80.0 - 100.0 fL    MCH 31.8 26.0 - 34.0 pg    MCHC 31.4 30.0 - 36.0 g/dL    RDW 14.7 82.9 - 56.2 %    Platelets 393 150 - 400 K/uL    nRBC 0.0 0.0 - 0.2 %    Neutrophils Relative % 71 %    Neutro Abs 10.0 (H) 1.7 - 7.7 K/uL    Lymphocytes Relative 13 %    Lymphs Abs 1.7 0.7 - 4.0 K/uL    Monocytes Relative 12 %    Monocytes Absolute 1.6 (H) 0.1 - 1.0 K/uL    Eosinophils Relative 3 %    Eosinophils Absolute 0.4 0.0 - 0.5 K/uL    Basophils Relative 0 %    Basophils Absolute 0.1 0.0 - 0.1 K/uL    Immature  Granulocytes 1 %    Abs Immature Granulocytes 0.11 (H) 0.00 - 0.07 K/uL      Comment: Performed at [facility], [address], [place], [place] [address]  Basic metabolic panel     Status: Abnormal    Collection Time: 03/18/23  8:50 AM  Result Value Ref Range    Sodium  133 (L) 135 - 145 mmol/L    Potassium 4.2 3.5 - 5.1 mmol/L    Chloride 103 98 - 111 mmol/L    CO2 20 (L) 22 - 32 mmol/L    Glucose, Bld 132 (H) 70 - 99 mg/dL      Comment: Glucose reference range applies only to samples taken after fasting for at least 8 hours.    BUN 25 (H) 6 - 20 mg/dL    Creatinine, Ser 1.61 0.61 - 1.24 mg/dL    Calcium 8.4 (L) 8.9 - 10.3 mg/dL    GFR, Estimated >09 >60 mL/min      Comment: (NOTE) Calculated using the CKD-EPI Creatinine Equation (2021)      Anion gap 10 5 - 15      Comment: Performed at [facility], [address], [place], [place] [address]  Magnesium     Status: None    Collection Time: 03/18/23  8:50 AM  Result Value Ref Range    Magnesium 1.9 1.7 - 2.4 mg/dL      Comment: Performed at [facility], [address] [place], [place] [address]  Glucose, capillary     Status: Abnormal    Collection Time: 03/18/23 11:16 AM  Result Value Ref Range    Glucose-Capillary 143 (H) 70 - 99 mg/dL      Comment: Glucose reference range applies only to samples taken after fasting for at least 8 hours.      Imaging Results (Last 48 hours)  No results found.         Blood pressure 117/66, pulse 75, temperature 98.2 F (36.8 C), temperature source Oral, resp. rate 18, height 6' (1.829 m), weight 96.3 kg, SpO2 99 %.   Medical Problem List and Plan: 1. Functional deficits secondary to osteomyelitis of left foot which necessitated a left BKA             -patient may shower if vac unit protected from water             -ELOS/Goals: 7-8 days, mod I with PT and OT at w/c level   2.  Antithrombotics: -DVT/anticoagulation:  Pharmaceutical: Lovenox              -antiplatelet therapy: Aspirin and Plavix    3. Pain Management: Tylenol, oxycodone as needed             -gabapentin 100 mg TID             -pt reports reasonable pain control at present 4. Mood/Behavior/Sleep: LCSW to evaluate and provide emotional support             -antipsychotic agents: n/a   5. Neuropsych/cognition: This patient is capable of making decisions on his own behalf.   6. Skin/Wound Care: Routine skin care checks             -Prevena vac comes off on 5/11---examine wound at that time             -right heel ulcer; off-load and continue daily dressing changes   7. Fluids/Electrolytes/Nutrition: Routine Is and Os and follow-up chemistries             -continue MVI and Vit C, zinc supplementation             -continue Juven   9: Hyperlipidemia: continue statin   10: DM-2: CBGs QID; carb modified diet             -continue  SSI             -continue Semglee 5 units at bedtime   11: Hyponatremia: trend/follow-up BMP   12: Elevated BUN: follow-up BMP             -encourage fluids 13: Leukocytosis: downward trend; follow-up CBC   14: Anemia: multifactorial/acute blood loss: follow-up CBC   15: 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 Lajoyce Corners on 4/07 to have legs wrapped with Dynaflex     Milinda Antis, PA-C 03/18/2023  I have personally performed a face to face diagnostic evaluation of this patient and formulated the key components of the plan.  Additionally, I have personally reviewed laboratory data, imaging studies, as well as relevant notes and concur with the physician assistant's documentation above.  The patient's status has not changed from the original H&P.  Any changes in documentation from the acute care chart have been noted above.  Ranelle Oyster, MD, [place]

## 2023-03-19 NOTE — Progress Notes (Signed)
Occupational Therapy Session Note  Patient Details  Name: Jesse [name] MRN: [id number] Date of Birth: 05-13-1961  Today's Date: 03/19/2023 OT Individual Time: 1330-1430 OT Individual Time Calculation (min): 60 min    Short Term Goals: Week 1:  OT Short Term Goal 1 (Week 1): Pt will complete toileting tasks with (S) overall OT Short Term Goal 2 (Week 1): Pt will complete stand from the w/c with CGA OT Short Term Goal 3 (Week 1): Pt will complete toilet transfer using LRAD with (S)  Skilled Therapeutic Interventions/Progress Updates:  Pt received sitting in the w/c with 7/10 pain in his residual limb and R foot, requesting pain medication- alerted RN who delivered shortly thereafter. He propelled the w/c using backward method and R foot with min cueing for looking behind him but (S) overall. He reports this is baseline w/c propulsion for him. He stood from the w/c and reported sudden need to have a BM. Returned to the room and pt completed 2 hops forward and a pivot to the [facility] over toilet with CGA using the RW. He required min cueing for RW management. He voided BM and urine. Min cueing from OT for positioning himself on the [facility] to be able to reach for peri care. After this cueing he was able to complete thorough hygiene with only CGA. He stood for skin inspection- alerted RN to bleeding area below glute and entered for assessment. He stood x3 and required min A with the RW. He returned to the w/c with min A following. He stood with CGA and completed 2x10 LLE hip extension and abduction to strengthen residual limb and reinforce proper positioning/alignment of limb. He was left sitting up with all needs met. Wound vac plugged in.    Therapy Documentation Precautions:  Precautions Precautions: Fall Precaution Comments: L BKA, R foot wound Other Brace: L LE limb protector Restrictions Weight Bearing Restrictions: Yes LLE Weight Bearing: Non weight bearing  Therapy/Group: Individual  Therapy  Crissie Reese 03/19/2023, 1:26 PM

## 2023-03-19 NOTE — Progress Notes (Signed)
Inpatient Rehabilitation Care Coordinator Assessment and Plan Patient Details  Name: Jesse [name] MRN: [id number] Date of Birth: 09/25/1961  Today's Date: 03/19/2023  Hospital Problems: Principal Problem:   Osteomyelitis of left lower extremity [facility])  Past Medical History:  Past Medical History:  Diagnosis Date   AKI (acute kidney injury) (HCC) 01/27/2014   Alcohol abuse    Anasarca    Bilateral leg edema 02/07/2021   Cellulitis    Cellulitis and abscess 01/26/2014   Cellulitis of right foot 04/17/2021   Dental abscess 01/26/2014   Diabetes mellitus type 2 in obese    Diabetic foot infection (HCC) 04/08/2021   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 2 diabetes mellitus, limited to breakdown of skin (HCC)    Facial cellulitis 01/26/2014   HTN (hypertension)    Hypokalemia 01/28/2014   Infected dental carries 01/26/2014   Leukocytosis 01/27/2014   Osteomyelitis (HCC) 04/14/2021   Right foot infection    Past Surgical History:  Past Surgical History:  Procedure Laterality Date   ABDOMINAL AORTOGRAM W/LOWER EXTREMITY N/A 03/06/2023   Procedure: ABDOMINAL AORTOGRAM W/LOWER EXTREMITY;  Surgeon: Cephus Shelling, MD;  Location: [facility] INVASIVE [facility];  Service: Cardiovascular;  Laterality: N/A;   AMPUTATION Left 03/15/2023   Procedure: AMPUTATION BELOW KNEE;  Surgeon: Nadara Mustard, MD;  Location: [facility] OR;  Service: Orthopedics;  Laterality: Left;   I & D EXTREMITY Right 04/18/2021   Procedure: IRRIGATION AND DEBRIDEMENT OF FOOT;  Surgeon: Nadara Mustard, MD;  Location: [facility] OR;  Service: Orthopedics;  Laterality: Right;   I & D EXTREMITY Right 04/20/2021   Procedure: EXCISIONAL DEBRIDEMENT RIGHT FOOT, APPLICATION OF SKIN GRAFT;  Surgeon: Nadara Mustard, MD;  Location: [facility] OR;  Service: Orthopedics;  Laterality: Right;   PERIPHERAL VASCULAR INTERVENTION  03/06/2023   Procedure: PERIPHERAL VASCULAR INTERVENTION;  Surgeon: Cephus Shelling, MD;  Location: [facility] INV[facility] CV [facility];   Service: Cardiovascular;;   Social History:  reports that he has never smoked. He has never used smokeless tobacco. He reports that he does not currently use alcohol after a past usage of about 2.0 standard drinks of alcohol per week. He reports that he does not use drugs.  Family / Support Systems Marital Status: Single Patient Roles: Other (Comment) (sibling and cousin) Other Supports: [name]-sister [phone] Wonda Cerise [phone] Brother who is a truck Dietitian Caregiver: self sister-[name] comes every once in a while to assist Ability/Limitations of Caregiver: pt will need to be mod/i to return home safely Caregiver Availability: Intermittent Family Dynamics: Close with siblings but all are busy and work and do not live locally. 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 He does what he needs to do to live alone and relies on himself.  He is aware of his wounds but doesn't really feel his feet or legs and feels this does not help with his healing. Was wheelchair level prior to admission Recent Psychosocial Issues: other health issues Psychiatric History: No history would benefit from seeing neuro-psych while here due to he is likely to lose his other leg with the wounds he has. Substance Abuse History: No history  Patient / Family Perceptions, Expectations & Goals Pt/Family understanding of illness & functional limitations: Pt can explain his amputation and the owunds on his other leg. He reports he had no idea he had them and can not feel his lower legs either. He does talk with the MD and feels he understands his treatment moving forward. He seems to need educaiton regarding his disabetes and wound healing Premorbid pt/family roles/activities: brother, cousin, friend Anticipated changes in roles/activities/participation: resume Pt/family expectations/goals: Pt states: " I hope to do well but not be here long."  [employer]: None Premorbid Home Care/DME Agencies: Other (Comment) (wc, rw, tub seat) Transportation available at discharge: uses medicaid transport and was using moped but diffcult due to leg wounds Is the patient able to respond to transportation needs?: Yes In the past 12 months, has lack of transportation kept you from medical appointments or from getting medications?: No In the past 12 months, has lack of transportation kept you from meetings, work, or from getting things needed for daily living?: No Resource referrals recommended: Neuropsychology  Discharge Planning Living Arrangements: Alone Support Systems: Friends/neighbors, Other relatives Type of Residence: Private residence Insurance Resources: Medicaid (specify county) Surveyor, quantity Resources: SSI Financial Screen Referred: No Living Expenses: Psychologist, sport and exercise Management: Patient Does the patient have any problems obtaining your  medications?: No Home Management: self Patient/Family Preliminary Plans: Return home alone will need to be mod/i to return home safely. He was managing unsure how well prior to admission with his leg wounds. He wants a pwoer chair due to other leg may not heal. Care Coordinator Barriers to Discharge: Decreased caregiver support, Lack of/limited family support Care Coordinator Anticipated Follow Up Needs: HH/OP  Clinical Impression Pleasant gentleman who is motivated to do well and recover, although his other leg has wounds also. Discussed with Sandra-OT power chair evaluation while here. Will await therapist evaluation and work on discharge needs.   Lucy Chris 03/19/2023, 10:19 AM

## 2023-03-19 NOTE — Progress Notes (Addendum)
PROGRESS NOTE   Subjective/Complaints: No new complaints this AM.   Review of Systems  Constitutional:  Negative for chills and fever.  HENT:  Negative for congestion.   Respiratory:  Negative for shortness of breath.   Cardiovascular:  Negative for chest pain.  Gastrointestinal:  Negative for nausea and vomiting.  Genitourinary:  Negative for dysuria.  Musculoskeletal:  Negative for joint pain.  Neurological:  Positive for sensory change and weakness. Negative for headaches.     Objective:   No results found. Recent Labs    03/18/23 0850 03/19/23 0459  WBC 13.9* 13.5*  HGB 8.3* 8.9*  HCT 26.4* 27.2*  PLT 393 424*   Recent Labs    03/18/23 0850 03/19/23 0459  NA 133* 133*  K 4.2 4.0  CL 103 103  CO2 20* 20*  GLUCOSE 132* 109*  BUN 25* 25*  CREATININE 1.01 1.14  CALCIUM 8.4* 8.5*    Intake/Output Summary (Last 24 hours) at 03/19/2023 0844 Last data filed at 03/19/2023 0700 Gross per 24 hour  Intake 118 ml  Output 1250 ml  Net -1132 ml        Physical Exam: Vital Signs Blood pressure (!) 141/83, pulse 86, temperature 97.7 F (36.5 C), temperature source Oral, resp. rate 17, height 6' (1.829 m), weight 89.1 kg, SpO2 98 %.  Physical Exam: Blood pressure 117/66, pulse 75, temperature 98.2 F (36.8 C), temperature source Oral, resp. rate 18, height 6' (1.829 m), weight 96.3 kg, SpO2 99 %.    General:  No apparent distress HEENT: Head is normocephalic, atraumatic, PERRLA, EOMI, poor dentition Neck: Supple without JVD or lymphadenopathy Heart: Reg rate and rhythm. No murmurs rubs or gallops Chest: CTA bilaterally without wheezes, rales, or rhonchi; no distress Abdomen: Soft, non-tender, non-distended, bowel sounds positive. Extremities: No clubbing, cyanosis, or edema. Pulses are 2+ Psych: Pt's affect is appropriate. Pt is cooperative Skin: Clean and intact without signs of  breakdown Musculoskeletal:     Right lower leg: Edema present.     Comments: Right foot/ankle dressing intact; Wound vac to left residual limb with good seal.  NO drainage in canister.  Skin:    Comments: Right heel ulcer, please see image- appears unchanged for image yesterday Neurological:     General: No focal deficit present.     Mental Status: He is alert and oriented to person, place, and time.     Comments: Alert and oriented x 3. Normal insight and awareness. Intact Memory. Normal language and speech. Cranial nerve exam unremarkable. MMT: 5/5 BLE, RLE grossly 4/5 prox to distal with some limitations distally. LLE limited by vac but grossly 3-4/5. Decreased LT RLE below mid calf.  Psychiatric:        Mood and Affect: Mood normal.        Assessment/Plan: 1. Functional deficits which require 3+ hours per day of interdisciplinary therapy in a comprehensive inpatient rehab setting. Physiatrist is providing close team supervision and 24 hour management of active medical problems listed below. Physiatrist and rehab team continue to assess barriers to discharge/monitor patient progress toward functional and medical goals  Care Tool:  Bathing  Bathing assist       Upper Body Dressing/Undressing Upper body dressing        Upper body assist      Lower Body Dressing/Undressing Lower body dressing            Lower body assist       Toileting Toileting    Toileting assist       Transfers Chair/bed transfer  Transfers assist           Locomotion Ambulation   Ambulation assist              Walk 10 feet activity   Assist           Walk 50 feet activity   Assist           Walk 150 feet activity   Assist           Walk 10 feet on uneven surface  activity   Assist           Wheelchair     Assist               Wheelchair 50 feet with 2 turns activity    Assist            Wheelchair  150 feet activity     Assist          Blood pressure (!) 141/83, pulse 86, temperature 97.7 F (36.5 C), temperature source Oral, resp. rate 17, height 6' (1.829 m), weight 89.1 kg, SpO2 98 %.  Medical Problem List and Plan: 1. Functional deficits secondary to osteomyelitis of left foot which necessitated a left BKA             -patient may shower if vac unit protected from water             -ELOS/Goals: 7-8 days, mod I with PT and OT at w/c level  -Team conference today please see physician documentation under team conference tab, met with team  to discuss problems,progress, and goals. Formulized individual treatment plan based on medical history, underlying problem and comorbidities.     2.  Antithrombotics: -DVT/anticoagulation:  Pharmaceutical: Lovenox             -antiplatelet therapy: Aspirin and Plavix    3. Pain Management: Tylenol, oxycodone as needed             -gabapentin 100 mg TID             -pt reports reasonable pain control at present 4. Mood/Behavior/Sleep: LCSW to evaluate and provide emotional support             -antipsychotic agents: n/a   5. Neuropsych/cognition: This patient is capable of making decisions on his own behalf.   6. Skin/Wound Care: Routine skin care checks             -Prevena vac comes off on 5/11---examine wound at that time             -right heel ulcer; off-load and continue daily dressing changes   7. Fluids/Electrolytes/Nutrition: Routine Is and Os and follow-up chemistries             -continue MVI and Vit C, zinc supplementation             -continue Juven   9: Hyperlipidemia: continue statin   10: DM-2: CBGs QID; carb modified diet             -continue SSI             -  continue Semglee 5 units at bedtime  -5/8 CBGs controlled   CBG (last 3)  Recent Labs    03/18/23 1750 03/18/23 2055 03/19/23 0621  GLUCAP 128* 143* 113*      11: Hyponatremia: trend/follow-up BMP -5/8 Na stable 133     Latest Ref Rng & Units  03/19/2023    4:59 AM 03/18/2023    8:50 AM 03/17/2023    9:02 AM  BMP  Glucose 70 - 99 mg/dL 161  096  045   BUN 6 - 20 mg/dL 25  25  27    Creatinine 0.61 - 1.24 mg/dL 4.09  8.11  9.14   Sodium 135 - 145 mmol/L 133  133  135   Potassium 3.5 - 5.1 mmol/L 4.0  4.2  4.5   Chloride 98 - 111 mmol/L 103  103  104   CO2 22 - 32 mmol/L 20  20  21    Calcium 8.9 - 10.3 mg/dL 8.5  8.4  8.5       12: Elevated BUN: follow-up BMP             -encourage fluids  -5/8 BUN stable 25, continue to encourage fluid intake  13: Leukocytosis: downward trend; follow-up CBC   -5/8 WBC slightly decreased to 13.5 today 14: Anemia: multifactorial/acute blood loss: follow-up CBC  -5/8 HGB stable at 8.9   15: 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 Lajoyce Corners on 4/07 to have legs wrapped with Dynaflex    LOS: 1 days A FACE TO FACE EVALUATION WAS PERFORMED  Fanny Dance 03/19/2023, 8:44 AM

## 2023-03-19 NOTE — Progress Notes (Signed)
Provided patient with education about need for seatbelt alarm or chair pad alarm. Patient refusing to use them, says it makes him feel "three times as bad" when it's on. Educated about purpose and ubiquity of alarms, still refuse. States he will not get up without calling for assistance.

## 2023-03-19 NOTE — Discharge Instructions (Addendum)
Inpatient Rehab Discharge Instructions  Jesse [name] Discharge date and time:  04/02/2023  Activities/Precautions/ Functional Status: Activity: no lifting, driving, or strenuous exercise until cleared by MD. Non-weight bearing to right lower extremity Diet: diabetic diet Wound Care: keep wound clean and dry; change dressing on right foot daily; contact Dr. Audrie Lia office for drainage, redness, pain Functional status:  ___ No restrictions     ___ Walk up steps independently ___ 24/7 supervision/assistance   ___ Walk up steps with assistance _x__ Intermittent supervision/assistance  ___ Bathe/dress independently ___ Walk with walker     ___ Bathe/dress with assistance ___ Walk Independently    ___ Shower independently ___ Walk with assistance    __x_ Shower with assistance _x__ No alcohol     ___ Return to work/school ________  Special Instructions: No driving, alcohol consumption or tobacco use.  My questions have been answered and I understand these instructions. I will adhere to these goals and the provided educational materials after my discharge from the hospital.  Patient/Caregiver Signature _______________________________ Date __________  Clinician Signature _______________________________________ Date __________  Please bring this form and your medication list with you to all your follow-up doctor's appointments.

## 2023-03-19 NOTE — Plan of Care (Signed)
  Problem: RH Balance Goal: LTG Patient will maintain dynamic standing balance (PT) Description: LTG:  Patient will maintain dynamic standing balance with assistance during mobility activities (PT) Flowsheets (Taken 03/19/2023 1714) LTG: Pt will maintain dynamic standing balance during mobility activities with:: Independent with assistive device    Problem: Sit to Stand Goal: LTG:  Patient will perform sit to stand with assistance level (PT) Description: LTG:  Patient will perform sit to stand with assistance level (PT) Flowsheets (Taken 03/19/2023 1714) LTG: PT will perform sit to stand in preparation for functional mobility with assistance level: Independent with assistive device   Problem: RH Bed Mobility Goal: LTG Patient will perform bed mobility with assist (PT) Description: LTG: Patient will perform bed mobility with assistance, with/without cues (PT). Flowsheets (Taken 03/19/2023 1714) LTG: Pt will perform bed mobility with assistance level of: Independent with assistive device    Problem: RH Bed to Chair Transfers Goal: LTG Patient will perform bed/chair transfers w/assist (PT) Description: LTG: Patient will perform bed to chair transfers with assistance (PT). Flowsheets (Taken 03/19/2023 1714) LTG: Pt will perform Bed to Chair Transfers with assistance level: Independent with assistive device    Problem: RH Car Transfers Goal: LTG Patient will perform car transfers with assist (PT) Description: LTG: Patient will perform car transfers with assistance (PT). Flowsheets (Taken 03/19/2023 1714) LTG: Pt will perform car transfers with assist:: Set up assist    Problem: RH Ambulation Goal: LTG Patient will ambulate in home environment (PT) Description: LTG: Patient will ambulate in home environment, # of feet with assistance (PT). Flowsheets (Taken 03/19/2023 1714) LTG: Pt will ambulate in home environ  assist needed:: Independent with assistive device LTG: Ambulation distance in home  environment: 10   Problem: RH Wheelchair Mobility Goal: LTG Patient will propel w/c in controlled environment (PT) Description: LTG: Patient will propel wheelchair in controlled environment, # of feet with assist (PT) Flowsheets (Taken 03/19/2023 1714) LTG: Pt will propel w/c in controlled environ  assist needed:: Independent with assistive device Goal: LTG Patient will propel w/c in home environment (PT) Description: LTG: Patient will propel wheelchair in home environment, # of feet with assistance (PT). Flowsheets (Taken 03/19/2023 1714) LTG: Pt will propel w/c in home environ  assist needed:: Independent with assistive device Goal: LTG Patient will propel w/c in community environment (PT) Description: LTG: Patient will propel wheelchair in community environment, # of feet with assist (PT) Flowsheets (Taken 03/19/2023 1714) LTG: Pt will propel w/c in community environ  assist needed:: Independent with assistive device

## 2023-03-19 NOTE — Progress Notes (Signed)
Inpatient Rehabilitation  Patient information reviewed and entered into eRehab system by Lezlee Gills M. Maryiah Olvey, M.A., CCC/SLP, PPS Coordinator.  Information including medical coding, functional ability and quality indicators will be reviewed and updated through discharge.    

## 2023-03-19 NOTE — Progress Notes (Signed)
Inpatient Rehabilitation Admission Medication Review by a Pharmacist  A complete drug regimen review was completed for this patient to identify any potential clinically significant medication issues.  High Risk Drug Classes Is patient taking? Indication by Medication  Antipsychotic No   Anticoagulant Yes Lovenox - DVT px  Antibiotic No   Opioid Yes Oxycodone - pain  Antiplatelet Yes ASA/Plavix - PVD  Hypoglycemics/insulin Yes SSI/semglee - DM  Vasoactive Medication No   Chemotherapy No   Other Yes Robaxin - spasms Trazodone - sleep Vitamin C/zinc - wound Protonix - GERD Neurontin - neuropathy Atorvastatin - HLD     Type of Medication Issue Identified Description of Issue Recommendation(s)  Drug Interaction(s) (clinically significant)     Duplicate Therapy     Allergy     No Medication Administration End Date     Incorrect Dose     Additional Drug Therapy Needed     Significant med changes from prior encounter (inform family/care partners about these prior to discharge).    Other  Cosopt Resume if needed in CIR or discharge    Clinically significant medication issues were identified that warrant physician communication and completion of prescribed/recommended actions by midnight of the next day:  No  Name of provider notified for urgent issues identified:   Provider Method of Notification:     Pharmacist comments:   Time spent performing this drug regimen review (minutes):  30   Devante Capano BS, PharmD, BCPS Clinical Pharmacist 03/19/2023 7:11 AM  Contact: [phone] after 3 PM  "Be curious, not judgmental..." -Debbora Dus

## 2023-03-19 NOTE — Progress Notes (Signed)
Physical Therapy Assessment and Plan  Patient Details  Name: Jesse [name] MRN: [id number] Date of Birth: 04-23-1961  PT Diagnosis: Abnormal posture, Abnormality of gait, Difficulty walking, Edema, Impaired cognition, Impaired sensation, Muscle weakness, and Pain in R foot  Rehab Potential: Fair ELOS: 10-12 days   Today's Date: 03/19/2023 PT Individual Time: 6213-0865 PT Individual Time Calculation (min): 71 min    Hospital Problem: Principal Problem:   Osteomyelitis of left lower extremity (HCC)   Past Medical History:  Past Medical History:  Diagnosis Date   AKI (acute kidney injury) (HCC) 01/27/2014   Alcohol abuse    Anasarca    Bilateral leg edema 02/07/2021   Cellulitis    Cellulitis and abscess 01/26/2014   Cellulitis of right foot 04/17/2021   Dental abscess 01/26/2014   Diabetes mellitus type 2 in obese    Diabetic foot infection (HCC) 04/08/2021   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 2 diabetes mellitus, limited to breakdown of skin (HCC)    Facial cellulitis 01/26/2014   HTN (hypertension)    Hypokalemia 01/28/2014   Infected dental carries 01/26/2014   Leukocytosis 01/27/2014   Osteomyelitis (HCC) 04/14/2021   Right foot infection    Past Surgical History:  Past Surgical History:  Procedure Laterality Date   ABDOMINAL AORTOGRAM W/LOWER EXTREMITY N/A 03/06/2023   Procedure: ABDOMINAL AORTOGRAM W/LOWER EXTREMITY;  Surgeon: Cephus Shelling, MD;  Location: [facility];  Service: Cardiovascular;  Laterality: N/A;   AMPUTATION Left 03/15/2023   Procedure: AMPUTATION BELOW KNEE;  Surgeon: Nadara Mustard, MD;  Location: [facility] OR;  Service: Orthopedics;  Laterality: Left;   I & D EXTREMITY Right 04/18/2021   Procedure: IRRIGATION AND DEBRIDEMENT OF FOOT;  Surgeon: Nadara Mustard, MD;  Location[facility] OR;  Service: Orthopedics;  Laterality: Right;   I & D EXTREMITY Right 04/20/2021   Procedure: EXCISIONAL DEBRIDEMENT RIGHT FOOT, APPLICATION OF SKIN GRAFT;   Surgeon: Nadara Mustard, MD;  Location: [facility] OR;  Service: Orthopedics;  Laterality: Right;   PERIPHERAL VASCULAR INTERVENTION  03/06/2023   Procedure: PERIPHERAL VASCULAR INTERVENTION;  Surgeon: Cephus Shelling, MD;  Location: [facility];  Service: Cardiovascular;;    Assessment & Plan Clinical Impression: Patient is a 62 y.o. 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     Patient had just recently been hospitalized 4/22-4/29 due to sepsis thought secondary to bilateral lower extremities wounds and was diagnosed with osteomyelitis of the left heel.  Initial imaging studies had noted concern for osteomyelitis on the right heel, but MRI did not reveal this.  He had undergone revascularization with Dr. Chestine Spore during his hospital stay orthopedics had been consulted and patient was recommended for below-knee amputation by Dr. Lajoyce Corners, but at that time did not want to consider it as he did not know how he would care for himself.  After getting discharged home patient noticed that he had not been taking any other medications as previously prescribed.  Patient had come back to the emergency department on 4/29 and 4/30 due to symptoms and ultimately left AGAINST MEDICAL ADVICE.  They given him supplies and things to change his dressings at home, but after getting home reported that the foot started to bleed and he was not able to manage it.  Denied having any significant fevers or chills.  At this time he is coming to terms with needing the amputation.   In emergency department patient was noted to be mildly tachypneic with all other vital signs maintained.  Labs significant for WBC 21.1, hemoglobin 10.2, sodium 131, CO2 16, BUN 30, creatinine 1.27, anion gap 11, albumin 1.9, and  lactic acid 1.2.  Urinalysis noted small hemoglobin, small leukocytes, no bacteria seen, 0-5 RBCs/hpf, and 0-5 WBCs. UDS was negative.  Patient had been started on empiric antibiotics of vancomycin and cefepime.  Orthopedics was consulted for need of BKA.During current presentation, pt now agreeable to amputation. 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 Therapy evaluations completed and CIR recommended d/t pt's deficits in functional mobility and inability to complete ADLs independently.    Patient currently requires min with mobility secondary to muscle weakness, decreased cardiorespiratoy endurance, and decreased standing balance, decreased postural control, decreased balance strategies, and difficulty maintaining precautions.  Prior to hospitalization, patient was modified independent  with mobility and lived with Alone in a Apartment home.  Home access is 1Stairs to enter.  Patient will benefit from skilled PT intervention to maximize safe functional mobility, minimize fall risk, and decrease caregiver burden for planned discharge home alone.  Anticipate patient will benefit from follow up HH at discharge.  PT - End of Session Activity Tolerance: Tolerates 30+ min activity with multiple rests Endurance Deficit: Yes Endurance Deficit Description: generalized weakness PT Assessment Rehab Potential (ACUTE/IP ONLY): Fair PT Barriers to Discharge: Home environment access/layout;Decreased caregiver support;Wound Care;Lack of/limited family support;Weight bearing restrictions;Medication compliance;Behavior PT Barriers to Discharge Comments: pain PT Patient demonstrates impairments in the following area(s): Balance;Pain;Behavior;Edema;Safety PT Transfers Functional Problem(s): Bed Mobility;Bed to Chair;Car PT Locomotion Functional Problem(s): Ambulation;Wheelchair Mobility PT Plan PT Intensity: Minimum of 1-2  x/day ,45 to 90 minutes PT Frequency: 5 out of 7 days PT Duration Estimated Length of Stay: 10-12 days PT Treatment/Interventions: Ambulation/gait training;Discharge planning;Cognitive remediation/compensation;DME/adaptive equipment instruction;Functional mobility training;Pain management;Splinting/orthotics;Therapeutic Activities;UE/LE Strength taining/ROM;Balance/vestibular training;Community reintegration;Disease management/prevention;Neuromuscular re-education;Patient/family education;Skin care/wound management;Stair training;Therapeutic Exercise;UE/LE Coordination activities;Wheelchair propulsion/positioning PT Transfers Anticipated Outcome(s): mod I PT Locomotion Anticipated Outcome(s): mod I PT Recommendation Recommendations for Other Services: Speech consult Follow Up Recommendations: Home health PT Patient destination: Home Equipment Recommended: To be determined Equipment Details: pt has manual WC and RW at home, will need power wheelchair, wheelchair consult scheduled for tuesday   PT Evaluation Precautions/Restrictions Precautions Precautions: Fall Precaution Comments: L BKA, R foot wound, wound vac Required Braces or Orthoses: Other Brace Other Brace: L LE limb protector Restrictions Weight Bearing Restrictions: Yes LLE Weight Bearing: Non weight bearing  Pain Interference Pain Interference Pain Effect on Sleep: 2. Occasionally Pain Interference with Therapy Activities: 1. Rarely or not at all Pain Interference with Day-to-Day Activities: 1. Rarely or not at all Home Living/Prior Functioning Home Living Available Help at Discharge: Other (Comment) (pt reports he does not have anyone to provide assistance upon discharge) Type of Home: Apartment Home Access: Stairs to enter Entrance Stairs-Number of Steps: 1 Entrance Stairs-Rails: None Home Layout: One level Bathroom Shower/Tub: Armed forces operational officer Accessibility: Yes Additional  Comments: pt reports cousin will not be able to assist upon discharge  Lives With: Alone Prior Function Level of Independence: Independent with basic ADLs;Independent with transfers;Requires assistive device for independence (pt reports he used a RW and WC at PLOF, primarily WC)  Able to Take Stairs?:  Yes Driving: Yes (a moped, pt does not have a car) Vocation: Unemployed Vision/Perception  Vision - History Ability to See in Adequate Light: 0 Adequate Perception Perception: Within Functional Limits Praxis Praxis: Intact  Cognition Overall Cognitive Status: No family/caregiver present to determine baseline cognitive functioning Arousal/Alertness: Awake/alert Orientation Level: Oriented X4 Memory: Impaired Memory Impairment: Decreased recall of new information Awareness: Appears intact Problem Solving: Appears intact Safety/Judgment: Impaired Sensation Sensation Light Touch: Impaired Detail Central sensation comments: Difficult to assess distal end of residual limb with wound vac on but sensation seems to be intact Coordination Gross Motor Movements are Fluid and Coordinated: No Fine Motor Movements are Fluid and Coordinated: Yes Coordination and Movement Description: L BKA Finger Nose Finger Test: [facility] Motor  Motor Motor: Other (comment) Motor - Skilled Clinical Observations: L BKA, R LE wound   Trunk/Postural Assessment  Cervical Assessment Cervical Assessment: Within Functional Limits Thoracic Assessment Thoracic Assessment: Within Functional Limits Lumbar Assessment Lumbar Assessment: Within Functional Limits Postural Control Postural Control: Deficits on evaluation Righting Reactions: Delayed  Balance Balance Balance Assessed: Yes Static Sitting Balance Static Sitting - Level of Assistance: 6: Modified independent (Device/Increase time) Dynamic Sitting Balance Dynamic Sitting - Level of Assistance: 6: Modified independent (Device/Increase time) Static Standing  Balance Static Standing - Balance Support: During functional activity;Bilateral upper extremity supported Static Standing - Level of Assistance: 5: Stand by assistance (CGA) Dynamic Standing Balance Dynamic Standing - Balance Support: During functional activity;Bilateral upper extremity supported Dynamic Standing - Level of Assistance: 5: Stand by assistance (CGA) Extremity Assessment  RUE Assessment General Strength Comments: Cramping in B hands affects FMC LUE Assessment LUE Assessment: Within Functional Limits General Strength Comments: Cramping in B hands affects FMC RLE Assessment RLE Assessment: Within Functional Limits LLE Assessment LLE Assessment: Exceptions to [facility] General Strength Comments: grossly 2/5, limited by pain/weakness  Care Tool Care Tool Bed Mobility Roll left and right activity   Roll left and right assist level: Supervision/Verbal cueing    Sit to lying activity   Sit to lying assist level: Supervision/Verbal cueing    Lying to sitting on side of bed activity   Lying to sitting on side of bed assist level: the ability to move from lying on the back to sitting on the side of the bed with no back support.: Supervision/Verbal cueing     Care Tool Transfers Sit to stand transfer   Sit to stand assist level: Minimal Assistance - Patient > 75%    Chair/bed transfer   Chair/bed transfer assist level: Minimal Assistance - Patient > 75%     Psychologist, clinical transfer assist level: Minimal Assistance - Patient > 75%      Care Tool Locomotion Ambulation Ambulation activity did not occur: Safety/medical concerns (pt refused due to R LE pain/wounds)        Walk 10 feet activity Walk 10 feet activity did not occur: Safety/medical concerns       Walk 50 feet with 2 turns activity Walk 50 feet with 2 turns activity did not occur: Safety/medical concerns      Walk 150 feet activity Walk 150 feet activity did not occur:  Safety/medical concerns      Walk 10 feet on uneven surfaces activity Walk 10 feet on uneven surfaces activity did not occur: Safety/medical concerns      Stairs Stair activity did not occur: Safety/medical concerns        Walk up/down 1  step activity Walk up/down 1 step or curb (drop down) activity did not occur: Safety/medical concerns (pt refused due to R LE pain/wound)      Walk up/down 4 steps activity Walk up/down 4 steps activity did not occur: Safety/medical concerns      Walk up/down 12 steps activity Walk up/down 12 steps activity did not occur: Safety/medical concerns      Pick up small objects from floor Pick up small object from the floor (from standing position) activity did not occur: Safety/medical concerns      Wheelchair Is the patient using a wheelchair?: Yes Type of Wheelchair: Manual   Wheelchair assist level: Supervision/Verbal cueing    Wheel 50 feet with 2 turns activity   Assist Level: Supervision/Verbal cueing  Wheel 150 feet activity   Assist Level: Supervision/Verbal cueing    Refer to Care Plan for Long Term Goals  SHORT TERM GOAL WEEK 1 PT Short Term Goal 1 (Week 1): Therapist will initiate power wheelchair consult PT Short Term Goal 2 (Week 1): Pt will initiate gait training PT Short Term Goal 3 (Week 1): Pt will perform sit<>stand with CGA and LRAD PT Short Term Goal 4 (Week 1): Pt will perform bed to chair transfer with CGA and LRAD  Recommendations for other services: None   Skilled Therapeutic Intervention Mobility Bed Mobility Bed Mobility: Sit to Supine;Supine to Sit Supine to Sit: Supervision/Verbal cueing Sit to Supine: Supervision/Verbal cueing Transfers Transfers: Sit to Stand;Stand to Sit;Stand Pivot Transfers Sit to Stand: Minimal Assistance - Patient > 75% Stand to Sit: Minimal Assistance - Patient > 75% Stand Pivot Transfers: Minimal Assistance - Patient > 75% Stand Pivot Transfer Details: Verbal cues for  sequencing;Verbal cues for technique;Verbal cues for precautions/safety Stand Pivot Transfer Details (indicate cue type and reason): CGA to min A-varies based on height of surface Transfer (Assistive device): Rolling walker Locomotion  Gait Ambulation: No Gait Gait: No Stairs / Additional Locomotion Stairs: No Wheelchair Mobility Wheelchair Assistance: Supervision/Verbal cueing Wheelchair Propulsion: Right lower extremity Distance: 150   Discharge Criteria: Patient will be discharged from PT if patient refuses treatment 3 consecutive times without medical reason, if treatment goals not met, if there is a change in medical status, if patient makes no progress towards goals or if patient is discharged from hospital.  The above assessment, treatment plan, treatment alternatives and goals were discussed and mutually agreed upon: by patient   Today's Interventions  Pt seated in WC upon arrival with slide board under L LE with slide board and cushion falling from beneath patient with wound vac on. Pt agreeable to therapy. Pt denies any pain.    Evaluation completed (see details above and below) with education on PT POC and goals and individual treatment initiated with focus on safety awareness. Education on PT evaluation, CIR policies and therapy schedule.   Pt performed wheelchair pushup for therapist to reposition cushion. Pt performed sit to stand with RW and min A, verbal cues provided for UE positioning for safety.   Pt performed stand pivot transfer WC to bed with RW and min A for stand and CGA for pivot with therapist navigating wound vac. Pt performed all bed mobility with supervision, with therapist navigating wound vac.   Pt performed stand pivot transfer bed to new WC with RW and CGA from elevated bed.   Therapist provided pt with L amputee leg rest.   Pt performed stand pivot transfer with RW to car simulator at height of van and CGA.  Pt attempted picking up cone from  floor with min A with RW but unsafe. Education provided for use of reacher to pick up objects from floor with standing.   Pt refused ambulation with RW as pt very unsafe with R LE wounds. Pt attempted curb transfer on 5" step with RW and min A, pt ascended backwards with demonstration and verbal cuing for technique, but pt stopped midway and requested to stop secondary to R LE wounds and discomfort.   Pt self propelled WC backwards with R LE x150 feet with increased time and supervision for obstacle negotiation. Pt attempted to propel with B UE and R LE, but unable to secondary to B UE spasms.   Pt questioning possibility of power chair. Discussed insurance will cover either power chair or prosthesis. Pt opted to prioritize prosthesis. Therapist began communication with [facility] to schedule motor wheelchair consult.   Pt seated in WC at end of session with all needs within reach. Pt attempted to apply seatbelt alarm and/or chair alarm but pt refused both, despite therapist providing education for purpose of safety devices and risk of not wearing them.  Notified nursing.    [facility] [place], DPT  03/19/2023, 4:57 PM

## 2023-03-19 NOTE — Progress Notes (Signed)
Inpatient Rehabilitation Admission Medication Review by a Pharmacist  A complete drug regimen review was completed for this patient to identify any potential clinically significant medication issues.  High Risk Drug Classes Is patient taking? Indication by Medication  Antipsychotic No   Anticoagulant Yes Lovenox - DVT px  Antibiotic No   Opioid Yes Oxycodone - pain  Antiplatelet Yes ASA/Plavix - PVD  Hypoglycemics/insulin Yes SSI/semglee - DM  Vasoactive Medication No   Chemotherapy No   Other Yes Robaxin - spasms Trazodone - sleep Vitamin C/zinc - wound Protonix - GERD Neurontin - neuropathy Atorvastatin - HLD     Type of Medication Issue Identified Description of Issue Recommendation(s)  Drug Interaction(s) (clinically significant)     Duplicate Therapy     Allergy     No Medication Administration End Date     Incorrect Dose     Additional Drug Therapy Needed     Significant med changes from prior encounter (inform family/care partners about these prior to discharge).    Other  Cosopt Resume if needed in CIR or discharge    Clinically significant medication issues were identified that warrant physician communication and completion of prescribed/recommended actions by midnight of the next day:  No  Name of provider notified for urgent issues identified:   Provider Method of Notification:     Pharmacist comments:   Time spent performing this drug regimen review (minutes):  30   Aylah Yeary BS, PharmD, BCPS Clinical Pharmacist 03/19/2023 7:01 AM  Contact: [phone] after 3 PM  "Be curious, not judgmental..." -Debbora Dus

## 2023-03-19 NOTE — Patient Care Conference (Signed)
Inpatient RehabilitationTeam Conference and Plan of Care Update Date: 03/19/2023   Time: 12:03 PM    Patient Name: Jesse [name]      Medical Record Number: [id number]  Date of Birth: 1960/11/19 Sex: Male         Room/Bed: 4W10C/4W10C-01 Payor Info: Payor: [place] MEDICAID PREPAID HEALTH PLAN / Plan: [place] MEDICAID [employer] [place] / Product Type: *No Product type* /    Admit Date/Time:  03/18/2023  3:51 PM  Primary Diagnosis:  Osteomyelitis of left lower extremity [facility])  Hospital Problems: Principal Problem:   Osteomyelitis of left lower extremity [facility])    Expected Discharge Date: Expected Discharge Date: 03/29/23  Team Members Present: Physician leading conference: Dr. Fanny Dance Social Worker Present: Dossie Der, LCSW Nurse Present: Chana Bode, RN PT Present: Ambrose Finland, PT OT Present: Jake Shark, OT PPS Coordinator present : Fae Pippin, SLP     Current Status/Progress Goal Weekly Team Focus  Bowel/Bladder   Pt is continent of bowel/bladder   Pt will remain continent of bowel/bladder   Will assess qshift and PRN    Swallow/Nutrition/ Hydration               ADL's   (S) UB ADLs, min A UB ADLs, min transfers   mod I   ADLs, transfers, dynamic balance, d.c planning    Mobility   CGA with sit<>stand, stand pivot transfer with RW, bed mobility supervision, wheelchair mobility supervision, pt uses R LE to propel backwards; pt unwilling to try gait/stairs due to pain in R LE   mod I  wheelchair consult for power WC    Communication                Safety/Cognition/ Behavioral Observations               Pain   Pt denies pain   Pt will remain pain free   Will assess qshift and PRN    Skin   Pt has diabetic ulcers on the right foot   Pt's diabetic ulcers will heal  Will assess qshift and PRN      Discharge Planning:  New evaluation today-home alone will need to be mod/i level and was told would be prior to admission here. Has  cousin who may help?   Team Discussion: Patient with left BKA wound vac and right foot ulcer(s) with dressing. Mild hyponatremia noted; encouraged fluids. Anemia stable per MD.   Patient on target to meet rehab goals: yes, currently needs supervision for upper body care and min assist for lower body care with min assist for stand pivot transfers. Needs mod assist to rise from low surfaces but CGA for normal sit - stand transfers. Needs CGA for car transfer and can complete stand pivot transfers using a rolling walker with CGA .  Goals for discharge set for mod I overall.  *See Care Plan and progress notes for long and short-term goals.   Revisions to Treatment Plan:  N/a   Teaching Needs: Skin care, medications, dietary modifications, transfers, etc.   Current Barriers to Discharge: Home enviroment access/layout, Wound care, Lack of/limited family support, and Weight bearing restrictions  Possible Resolutions to Barriers: Power wheelchair consult Recommend ramp for entry to apartment; SW to contact landlord regarding ramp installation DME: Power wheelchair, TTB and Drop arm [facility]     Medical Summary Current Status: R foot wound, R BKA, leukocytosis, LE edema, Hyponatremia, DM2, elevated BUn, anemia, DM2  Barriers to Discharge: Medical stability;Weight  bearing restrictions;Complicated Wound;Self-care education  Barriers to Discharge Comments: R foot wound, R BKA, leukocytosis, LE edema, Hyponatremia, DM2, elevated BUn, anemia, DM2, accessibility Possible Resolutions to [employer] Focus: WOund care for R LE and educate on care, continue wound vac until 5/11, monitor CBG, encourage fluids   Continued Need for Acute Rehabilitation Level of Care: The patient requires daily medical management by a physician with specialized training in physical medicine and rehabilitation for the following reasons: Direction of a multidisciplinary physical rehabilitation program to maximize functional  independence : Yes Medical management of patient stability for increased activity during participation in an intensive rehabilitation regime.: Yes Analysis of laboratory values and/or radiology reports with any subsequent need for medication adjustment and/or medical intervention. : Yes   I attest that I was present, lead the team conference, and concur with the assessment and plan of the team.   Chana Bode B 03/19/2023, 4:20 PM

## 2023-03-20 DIAGNOSIS — R799 Abnormal finding of blood chemistry, unspecified: Secondary | ICD-10-CM | POA: Diagnosis not present

## 2023-03-20 DIAGNOSIS — E11621 Type 2 diabetes mellitus with foot ulcer: Secondary | ICD-10-CM | POA: Diagnosis not present

## 2023-03-20 DIAGNOSIS — L97519 Non-pressure chronic ulcer of other part of right foot with unspecified severity: Secondary | ICD-10-CM | POA: Diagnosis not present

## 2023-03-20 DIAGNOSIS — M869 Osteomyelitis, unspecified: Secondary | ICD-10-CM | POA: Diagnosis not present

## 2023-03-20 DIAGNOSIS — H5789 Other specified disorders of eye and adnexa: Secondary | ICD-10-CM

## 2023-03-20 LAB — GLUCOSE, CAPILLARY
Glucose-Capillary: 102 mg/dL — ABNORMAL HIGH (ref 70–99)
Glucose-Capillary: 97 mg/dL (ref 70–99)
Glucose-Capillary: 129 mg/dL — ABNORMAL HIGH (ref 70–99)
Glucose-Capillary: 130 mg/dL — ABNORMAL HIGH (ref 70–99)

## 2023-03-20 MED ORDER — ARTIFICIAL TEARS OPHTHALMIC OINT
TOPICAL_OINTMENT | OPHTHALMIC | Status: DC | PRN
  Filled 2023-03-20: qty 3.5

## 2023-03-20 MED ORDER — GERHARDT'S BUTT CREAM
TOPICAL_CREAM | Freq: Two times a day (BID) | CUTANEOUS | Status: DC
  Administered 2023-03-30: 1 via TOPICAL
  Filled 2023-03-20: qty 1

## 2023-03-20 NOTE — Progress Notes (Signed)
Occupational Therapy Session Note  Patient Details  Name: Jesse [name] MRN: [id number] Date of Birth: 05/18/1961  Today's Date: 03/20/2023 OT Individual Time: 4098-1191 OT Individual Time Calculation (min): 47 min    Short Term Goals: Week 1:  OT Short Term Goal 1 (Week 1): Pt will complete toileting tasks with (S) overall OT Short Term Goal 2 (Week 1): Pt will complete stand from the w/c with CGA OT Short Term Goal 3 (Week 1): Pt will complete toilet transfer using LRAD with (S)  Skilled Therapeutic Interventions/Progress Updates:    Patient agreeable to participate in OT session. Reports some pain/discomfort in right foot when it is "messed with". States a bandaid was put on it. Did not provide a number score. Monitored during session.   Patient participated in skilled OT session focusing on ADL re-training, functional transfers, and standing balance during sink level bathing and dressing. Therapist provided education during all mobility and ADL tasks regarding safety and judgement while managing RW and wheelchair in order to promote improved judgement and safety awareness when participating in all daily tasks with decreased risk of falls. UB bathing and dressing completed seated in w/c at sink level with Set-up. LB bathing completed seated performing lateral leans and sit to stand to wash buttocks. Min A provided for standing balance and for washing buttocks. LB dressing completed with Max A. VC provided during session for sequencing and safety awareness as needed. Required Mod A for power up from lower surfaces (recliner) using RW.  Pt declined safety alarm belt at end of session .  Therapy Documentation Precautions:  Precautions Precautions: Fall Precaution Comments: L BKA, R foot wound, wound vac Required Braces or Orthoses: Other Brace Other Brace: L LE limb protector Restrictions Weight Bearing Restrictions: Yes LLE Weight Bearing: Non weight bearing   Therapy/Group:  Individual Therapy  Limmie Patricia, OTR/L,CBIS  Supplemental OT - [facility] and [facility] Secure Chat Preferred   03/20/2023, 7:55 AM

## 2023-03-20 NOTE — Progress Notes (Signed)
Right foot assessed with attendant RN. I probed the heel wound and am unable to express any drainage. Clean with soap and water daily. Continue with Betadine paint for heel wound and cover plantar ulcer with Adaptic and cover with dry gauze. Change daily.

## 2023-03-20 NOTE — Progress Notes (Signed)
Orthopedic Tech Progress Note Patient Details:  Jesse [name] [birth year] [id number]   Ortho Devices Type of Ortho Device: Prafo boot/shoe Ortho Device/Splint Location: Deliver[facility] to 4west desk Ortho Device/Splint Interventions: Floria Raveling 03/20/2023, 12:01 PM

## 2023-03-20 NOTE — Progress Notes (Signed)
Physical Therapy Session Note  Patient Details  Name: Jesse [name] MRN: [id number] Date of Birth: 01/01/1961  Today's Date: 03/20/2023 PT Individual Time: 1120-1203 PT Individual Time Calculation (min): 43 min   Short Term Goals: Week 1:  PT Short Term Goal 1 (Week 1): Therapist will initiate power wheelchair consult PT Short Term Goal 2 (Week 1): Pt will initiate gait training PT Short Term Goal 3 (Week 1): Pt will perform sit<>stand with CGA and LRAD PT Short Term Goal 4 (Week 1): Pt will perform bed to chair transfer with CGA and LRAD  Skilled Therapeutic Interventions/Progress Updates: Patient in [facility] on entrance to room. Patient alert and agreeable to PT session.   Patient reported 6/10 pain on R lateral LE.  Wheelchair Mobility:  - Patient was seen with R heel padding that prevented him from putting foot on foot pedal for transportation. PTA provided and adjusted new leg rest that could be elevated.  Therapeutic Exercise: Pt performed the following exercises with therapist providing the described cuing and facilitation for improvement. TE performed to increase patient's cardiovascular endurance with rest breaks provided throughout circuit 2/2 patient report of 6/10 pain on R lateral thigh when performing marches (muscle fatigue pain), and 7/10 pain on R lateral LE above lateral malleolus.  - R LAQ 4 x 10 (alternated between sets with anterior weight shift/forward leaning and sitting march) VC provided for slow/controlled motion, and 1 second hold at full extension.  - sitting march R LE 4 x 10 (VC provided for slow and controlled motion) - Anterior weightshift/forward leaning to PTA hand in various heights and directions with cues to engage abdominals. Patient instructed to anteriorly scoot on WC to avoid using back of WC. VC provided for patient to keep upright posture.  Patient left in [facility] at end of session with brakes locked, bed alarm set, and all needs within reach.       Therapy Documentation Precautions:  Precautions Precautions: Fall Precaution Comments: L BKA, R foot wound, wound vac Required Braces or Orthoses: Other Brace Other Brace: L LE limb protector Restrictions Weight Bearing Restrictions: Yes LLE Weight Bearing: Non weight bearing  Therapy/Group: Individual Therapy  Brenly Trawick PTA 03/20/2023, 4:07 PM

## 2023-03-20 NOTE — Progress Notes (Signed)
Physical Therapy Session Note  Patient Details  Name: Jesse [name] MRN: [id number] Date of Birth: Nov 11, 1961  Today's Date: 03/20/2023 PT Individual Time: 0915-1020, 4098-1191 PT Individual Time Calculation (min): 65 min, 58 min   Short Term Goals: Week 1:  PT Short Term Goal 1 (Week 1): Therapist will initiate power wheelchair consult PT Short Term Goal 2 (Week 1): Pt will initiate gait training PT Short Term Goal 3 (Week 1): Pt will perform sit<>stand with CGA and LRAD PT Short Term Goal 4 (Week 1): Pt will perform bed to chair transfer with CGA and LRAD  Skilled Therapeutic Interventions/Progress Updates:      Treatment Session 1   Pt seated in WC upon arrival with L LE limb protector and wound vac on. Pt agreeable to therapy. Pt reports 9/10 R foot pain. Therapist and pt noticied significantly foul odor from R LE. Notified nurse, MD, and PA via secure chat.   Order changed for pt to wear prevalon boot at all times when OOB/in chair.  Messaged IDT via secure chat to determine if R LE should be NWB as it is not safe for pt to stand with prevalon boot. PA is ordering [employer]. Holding standing activity until R LE [facility] arrives.   Therapist donned prevalon boot on R LE.   Pt performed the following seated therex with emphasis on UE/LE strength:    3x10 alternating marching   3x10 glute sets   3x10 B LAQ  3x10 L quad sets  3x10 hip abduction  3x10 wheelchair pushups   3x10 bicep curls with 5# dowel rod   Attempted shoulder press and horizontal chest press, but pt unable to secondary to L shoulder with overhead motion.   Pt seated in WC at end of session with all needs within reach. Pt refused chair alarm/seatbelt alarm.   Treatment Session 2   Pt seated in WC upon arrival. Pt agreeable to therapy. Pt reports buttock pain. Pt notified nursing, nursing wanting to assess buttocks. Pt wearing R PRAFO as [employer] has not yet arrived. Holding standing activity until [facility] arrives.   Pt  performed slide board transfer WC <> bed with supervision, pt reports does not increase buttock pain. Pt performed sit<>supine and rolling L with supervision.   Therapist provided pt with Roho cushion. Pt performed slide board transfer bed <> WC with supervision. Therapist ensured proper fit of roho cushion.   Pt performed slide board transfer bed<>WC with supervision. Pt performed sitting to supine and rolling to prone with supervision. Pt performed 1x10 prone hip extension bilaterally , 1x10 sidelying hip abduction bilaterally, 1x10 bilateral SLR with verbal and tactile cues for technique, and to reduce compensation.   Education provided for pressure relief changing position every 2 hours, alternating being up in chair and in hospital bed to reduce friction/shear forces on buttocks. Pt verbalizes understanding but reports he likes to sit up in his WC during day and sleep in recliner.   Pt seated in WC at end of session with all needs within reach. Pt refused chair alarm but reports he will not get up by himself. Nursing notified.   Therapy Documentation Precautions:  Precautions Precautions: Fall Precaution Comments: L BKA, R foot wound, wound vac Required Braces or Orthoses: Other Brace Other Brace: L LE limb protector Restrictions Weight Bearing Restrictions: Yes LLE Weight Bearing: Non weight bearing     Therapy/Group: Individual Therapy  [facility] Ambrose Finland, [place], DPT  03/20/2023, 7:38 AM

## 2023-03-20 NOTE — Progress Notes (Signed)
PROGRESS NOTE   Subjective/Complaints: Wound R foot continues to have odor. Discussed R foot with Dr. Lajoyce Corners, [facility] boot for RLE ordered. Pt reports some irritation to R eye due to eye lashes, reports he had eye drops in past. Dr. No new additional concerns this AM.  Denies pain.    Review of Systems  Constitutional:  Negative for chills and fever.  HENT:  Negative for congestion.   Respiratory:  Negative for shortness of breath.   Cardiovascular:  Negative for chest pain.  Gastrointestinal:  Negative for nausea and vomiting.  Genitourinary:  Negative for dysuria.  Musculoskeletal:  Negative for joint pain.  Skin:  Negative for rash.  Neurological:  Positive for sensory change and weakness. Negative for headaches.     Objective:   No results found. Recent Labs    03/18/23 0850 03/19/23 0459  WBC 13.9* 13.5*  HGB 8.3* 8.9*  HCT 26.4* 27.2*  PLT 393 424*    Recent Labs    03/18/23 0850 03/19/23 0459  NA 133* 133*  K 4.2 4.0  CL 103 103  CO2 20* 20*  GLUCOSE 132* 109*  BUN 25* 25*  CREATININE 1.01 1.14  CALCIUM 8.4* 8.5*     Intake/Output Summary (Last 24 hours) at 03/20/2023 0831 Last data filed at 03/20/2023 0700 Gross per 24 hour  Intake 118 ml  Output --  Net 118 ml         Physical Exam: Vital Signs Blood pressure 132/66, pulse 77, temperature 98.7 F (37.1 C), resp. rate 15, height 6' (1.829 m), weight 89.1 kg, SpO2 99 %.  Physical Exam: Blood pressure 117/66, pulse 75, temperature 98.2 F (36.8 C), temperature source Oral, resp. rate 18, height 6' (1.829 m), weight 96.3 kg, SpO2 99 %.    General:  No apparent distress HEENT: Head is normocephalic, atraumatic, PERRLA, EOMI, poor dentition, no eye erythema/drainage noted Neck: Supple without JVD or lymphadenopathy Heart: Reg rate and rhythm. No murmurs rubs or gallops Chest: CTA bilaterally without wheezes, rales, or rhonchi; no  distress Abdomen: Soft, non-tender, non-distended, bowel sounds positive. Extremities: No clubbing, cyanosis, or edema. Pulses are 2+ Psych: Pt's affect is appropriate. Pt is cooperative Skin: Clean and intact without signs of breakdown Musculoskeletal:     Right lower leg: Edema present.     Comments: Right foot/ankle dressing intact; Wound vac to left residual limb with good seal.  NO drainage in canister.  Skin:    Comments: Right heel ulcer, please see image- appears unchanged for image yesterday, wound does have chronic odor  Neurological:     General: No focal deficit present.     Mental Status: He is alert and oriented to person, place, and time.     Comments: Alert and oriented x 3. Normal insight and awareness. Intact Memory. Normal language and speech. Cranial nerve exam unremarkable. MMT: 5/5 BLE, RLE grossly 4/5 prox to distal with some limitations distally. LLE limited by vac but grossly 3-4/5. Decreased LT RLE below mid calf.  Psychiatric:        Mood and Affect: Mood normal.        Assessment/Plan: 1. Functional deficits which require 3+ hours per  day of interdisciplinary therapy in a comprehensive inpatient rehab setting. Physiatrist is providing close team supervision and 24 hour management of active medical problems listed below. Physiatrist and rehab team continue to assess barriers to discharge/monitor patient progress toward functional and medical goals  Care Tool:  Bathing    Body parts bathed by patient: Right arm, Left arm, Chest, Abdomen, Front perineal area, Right upper leg, Left upper leg, Right lower leg, Face   Body parts bathed by helper: Buttocks Body parts n/a: Left lower leg (BKA)   Bathing assist Assist Level: Minimal Assistance - Patient > 75%     Upper Body Dressing/Undressing Upper body dressing   What is the patient wearing?: Pull over shirt    Upper body assist Assist Level: Supervision/Verbal cueing    Lower Body  Dressing/Undressing Lower body dressing      What is the patient wearing?: Pants     Lower body assist Assist for lower body dressing: Minimal Assistance - Patient > 75%     Toileting Toileting    Toileting assist Assist for toileting: Moderate Assistance - Patient 50 - 74%     Transfers Chair/bed transfer  Transfers assist     Chair/bed transfer assist level: Minimal Assistance - Patient > 75%     Locomotion Ambulation   Ambulation assist   Ambulation activity did not occur: Safety/medical concerns (pt refused due to R LE pain/wounds)          Walk 10 feet activity   Assist  Walk 10 feet activity did not occur: Safety/medical concerns        Walk 50 feet activity   Assist Walk 50 feet with 2 turns activity did not occur: Safety/medical concerns         Walk 150 feet activity   Assist Walk 150 feet activity did not occur: Safety/medical concerns         Walk 10 feet on uneven surface  activity   Assist Walk 10 feet on uneven surfaces activity did not occur: Safety/medical concerns         Wheelchair     Assist Is the patient using a wheelchair?: Yes Type of Wheelchair: Manual    Wheelchair assist level: Supervision/Verbal cueing      Wheelchair 50 feet with 2 turns activity    Assist        Assist Level: Supervision/Verbal cueing   Wheelchair 150 feet activity     Assist      Assist Level: Supervision/Verbal cueing   Blood pressure 132/66, pulse 77, temperature 98.7 F (37.1 C), resp. rate 15, height 6' (1.829 m), weight 89.1 kg, SpO2 99 %.  Medical Problem List and Plan: 1. 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  Antithrombotics: -DVT/anticoagulation:  Pharmaceutical: Lovenox              -antiplatelet therapy: Aspirin and Plavix    3. Pain Management: Tylenol, oxycodone as needed             -gabapentin 100 mg TID             -pt reports reasonable pain control at present 4. Mood/Behavior/Sleep: LCSW to  evaluate and provide emotional support             -antipsychotic agents: n/a   5. Neuropsych/cognition: This patient is capable of making decisions on his own behalf.   6. Skin/Wound Care: Routine skin care checks             -Prevena vac comes off on 5/11---examine wound at that time             -right heel ulcer; off-load and continue daily dressing changes  -Dicussed with Dr. Lajoyce Corners 5/9, ordered ARAFO to wear at all times ok to WB in [facility]   7. Fluids/Electrolytes/Nutrition: Routine Is and Os and follow-up chemistries             -continue MVI and Vit C, zinc supplementation             -continue Juven   9: Hyperlipidemia: continue statin   10: DM-2: CBGs QID; carb modified diet             -continue SSI             -continue Semglee 5 units at bedtime  -5/9 CBG controlled, continue current   CBG (last 3)  Recent Labs    03/19/23 1618 03/19/23 2136 03/20/23 0618  GLUCAP 141* 169* 102*       11: Hyponatremia: trend/follow-up BMP -5/8 Na stable 133     Latest Ref Rng & Units 03/19/2023    4:59 AM 03/18/2023    8:50 AM 03/17/2023    9:02 AM  BMP  Glucose 70 - 99 mg/dL 811  914  782   BUN 6 - 20 mg/dL 25  25  27    Creatinine 0.61 - 1.24 mg/dL 9.56  2.13  0.86   Sodium 135 - 145 mmol/L 133  133  135   Potassium 3.5 - 5.1 mmol/L 4.0  4.2  4.5   Chloride 98 - 111 mmol/L 103  103  104   CO2 22 - 32 mmol/L 20  20  21    Calcium 8.9 - 10.3 mg/dL 8.5  8.4  8.5       12: Elevated BUN: follow-up BMP             -encourage fluids  -5/8 BUN stable 25, continue to encourage fluid intake  -Discussed fluid intake  13: Leukocytosis: downward trend; follow-up CBC   -5/8 WBC slightly decreased to 13.5 today  14: Anemia: multifactorial/acute blood loss: follow-up  CBC  -5/8 HGB stable at 8.9   15: 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 Lajoyce Corners on 4/07 to have legs wrapped with Dynaflex   16 Right Eye irritation  -Lubricating drops ordered Q4h prn  LOS: 2 days A FACE TO FACE EVALUATION WAS PERFORMED  Fanny Dance 03/20/2023, 8:31 AM

## 2023-03-21 DIAGNOSIS — E785 Hyperlipidemia, unspecified: Secondary | ICD-10-CM

## 2023-03-21 DIAGNOSIS — E871 Hypo-osmolality and hyponatremia: Secondary | ICD-10-CM | POA: Diagnosis not present

## 2023-03-21 DIAGNOSIS — D75839 Thrombocytosis, unspecified: Secondary | ICD-10-CM

## 2023-03-21 DIAGNOSIS — F54 Psychological and behavioral factors associated with disorders or diseases classified elsewhere: Secondary | ICD-10-CM

## 2023-03-21 DIAGNOSIS — R7989 Other specified abnormal findings of blood chemistry: Secondary | ICD-10-CM

## 2023-03-21 DIAGNOSIS — L97519 Non-pressure chronic ulcer of other part of right foot with unspecified severity: Secondary | ICD-10-CM | POA: Diagnosis not present

## 2023-03-21 DIAGNOSIS — M869 Osteomyelitis, unspecified: Secondary | ICD-10-CM | POA: Diagnosis not present

## 2023-03-21 DIAGNOSIS — E11621 Type 2 diabetes mellitus with foot ulcer: Secondary | ICD-10-CM | POA: Diagnosis not present

## 2023-03-21 LAB — GLUCOSE, CAPILLARY
Glucose-Capillary: 137 mg/dL — ABNORMAL HIGH (ref 70–99)
Glucose-Capillary: 121 mg/dL — ABNORMAL HIGH (ref 70–99)
Glucose-Capillary: 130 mg/dL — ABNORMAL HIGH (ref 70–99)
Glucose-Capillary: 141 mg/dL — ABNORMAL HIGH (ref 70–99)

## 2023-03-21 MED ORDER — ACETAMINOPHEN 325 MG PO TABS: 325.0000 mg | ORAL_TABLET | Freq: Four times a day (QID) | ORAL | Status: DC | PRN

## 2023-03-21 MED ORDER — ATORVASTATIN CALCIUM 40 MG PO TABS
40.0000 mg | ORAL_TABLET | Freq: Every day | ORAL | Status: DC
  Administered 2023-03-25 – 2023-04-01 (×8): 40 mg via ORAL
  Filled 2023-03-21 (×8): qty 1

## 2023-03-21 NOTE — Progress Notes (Signed)
PROGRESS NOTE   Subjective/Complaints: No new concerns this morning.  PRAFO was ordered but was too small, larger size has been ordered.  Reports pain is controlled.  He would like to shave, may be best if family or friend could bring an Neurosurgeon.   Review of Systems  Constitutional:  Negative for chills, fever and malaise/fatigue.  HENT:  Negative for congestion.   Respiratory:  Negative for shortness of breath.   Cardiovascular:  Negative for chest pain.  Gastrointestinal:  Negative for abdominal pain, constipation, nausea and vomiting.  Genitourinary:  Negative for dysuria.  Musculoskeletal:  Negative for joint pain.  Skin:  Negative for rash.  Neurological:  Positive for sensory change and weakness. Negative for headaches.     Objective:   No results found. Recent Labs    03/18/23 0850 03/19/23 0459  WBC 13.9* 13.5*  HGB 8.3* 8.9*  HCT 26.4* 27.2*  PLT 393 424*    Recent Labs    03/18/23 0850 03/19/23 0459  NA 133* 133*  K 4.2 4.0  CL 103 103  CO2 20* 20*  GLUCOSE 132* 109*  BUN 25* 25*  CREATININE 1.01 1.14  CALCIUM 8.4* 8.5*     Intake/Output Summary (Last 24 hours) at 03/21/2023 0752 Last data filed at 03/20/2023 2131 Gross per 24 hour  Intake 118 ml  Output 250 ml  Net -132 ml         Physical Exam: Vital Signs Blood pressure 119/70, pulse 78, temperature 98.2 F (36.8 C), resp. rate 17, height 6' (1.829 m), weight 89.1 kg, SpO2 99 %.  Physical Exam: Blood pressure 117/66, pulse 75, temperature 98.2 F (36.8 C), temperature source Oral, resp. rate 18, height 6' (1.829 m), weight 96.3 kg, SpO2 99 %.    General:  No apparent distress HEENT: [place], AT, poor dentition, MMM Heart: Reg rate and rhythm.  Chest: CTA bilaterally Abdomen: Soft, non-tender, non-distended, bowel sounds positive. Extremities: Right heel ulcer, left BKA Psych: Pt's affect is appropriate. Pt is  cooperative  Musculoskeletal:     Right lower leg: Edema present.     Comments: Right foot/ankle dressing intact; Wound vac to left residual limb with good seal.  NO drainage in canister.  Skin:    Comments: Right heel ulcer, please see image- appears unchanged , wound does have chronic odor, dressing clean and dry this morning Neurological:     General: No focal deficit present.     Mental Status: He is alert and oriented to person, place, and time.     Comments: Alert and oriented x 3. Normal insight and awareness. Intact Memory. Normal language and speech. Cranial nerve exam unremarkable. MMT: 5/5 BLE, RLE grossly 4/5 prox to distal with some limitations distally. LLE limited by vac but grossly 3-4/5. Decreased LT RLE below mid calf.  Psychiatric:        Mood and Affect: Mood normal.      Photo was taken yesterday Wendi Maya   Assessment/Plan: 1. Functional deficits which require 3+ hours per day of interdisciplinary therapy in a comprehensive inpatient rehab setting. Physiatrist is providing close team supervision and 24 hour management of active medical problems listed below.  Physiatrist and rehab team continue to assess barriers to discharge/monitor patient progress toward functional and medical goals  Care Tool:  Bathing    Body parts bathed by patient: Right arm, Left arm, Chest, Abdomen, Front perineal area, Right upper leg, Left upper leg, Right lower leg, Face   Body parts bathed by helper: Buttocks Body parts n/a: Left lower leg (BKA)   Bathing assist Assist Level: Minimal Assistance - Patient > 75%     Upper Body Dressing/Undressing Upper body dressing   What is the patient wearing?: Pull over shirt    Upper body assist Assist Level: Supervision/Verbal cueing    Lower Body Dressing/Undressing Lower body dressing      What is the patient wearing?: Pants     Lower body assist Assist for lower body dressing: Minimal Assistance - Patient > 75%      Toileting Toileting    Toileting assist Assist for toileting: Moderate Assistance - Patient 50 - 74%     Transfers Chair/bed transfer  Transfers assist     Chair/bed transfer assist level: Minimal Assistance - Patient > 75%     Locomotion Ambulation   Ambulation assist   Ambulation activity did not occur: Safety/medical concerns (pt refused due to R LE pain/wounds)          Walk 10 feet activity   Assist  Walk 10 feet activity did not occur: Safety/medical concerns        Walk 50 feet activity   Assist Walk 50 feet with 2 turns activity did not occur: Safety/medical concerns         Walk 150 feet activity   Assist Walk 150 feet activity did not occur: Safety/medical concerns         Walk 10 feet on uneven surface  activity   Assist Walk 10 feet on uneven surfaces activity did not occur: Safety/medical concerns         Wheelchair     Assist Is the patient using a wheelchair?: Yes Type of Wheelchair: Manual    Wheelchair assist level: Supervision/Verbal cueing      Wheelchair 50 feet with 2 turns activity    Assist        Assist Level: Supervision/Verbal cueing   Wheelchair 150 feet activity     Assist      Assist Level: Supervision/Verbal cueing   Blood pressure 119/70, pulse 78, temperature 98.2 F (36.8 C), resp. rate 17, height 6' (1.829 m), weight 89.1 kg, SpO2 99 %.  Medical Problem List and Plan: 1. 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  Antithrombotics: -DVT/anticoagulation:  Pharmaceutical: Lovenox             -antiplatelet therapy: Aspirin and Plavix    3. Pain Management: Tylenol,  oxycodone as needed             -gabapentin 100 mg TID             -pt reports reasonable pain control at present 4. Mood/Behavior/Sleep: LCSW to evaluate and provide emotional support             -  antipsychotic agents: n/a   5. Neuropsych/cognition: This patient is capable of making decisions on his own behalf.   6. Skin/Wound Care: Routine skin care checks             -Prevena vac comes off on 5/11---examine wound at that time             -right heel ulcer; off-load and continue daily dressing changes  -Dicussed with Dr. Lajoyce Corners 5/9, ordered ARAFO to wear at all times ok to WB in [facility]   7. Fluids/Electrolytes/Nutrition: Routine Is and Os and follow-up chemistries             -continue MVI and Vit C, zinc supplementation             -continue Juven   9: Hyperlipidemia: continue statin -Hold statin for a few days, recheck Monday   10: DM-2: CBGs QID; carb modified diet             -continue SSI             -continue Semglee 5 units at bedtime  -5/10 CBGs controlled   CBG (last 3)  Recent Labs    03/20/23 1639 03/20/23 2104 03/21/23 0608  GLUCAP 129* 130* 137*       11: Hyponatremia: trend/follow-up BMP -5/8 Na stable 133 recheck Monday     Latest Ref Rng & Units 03/19/2023    4:59 AM 03/18/2023    8:50 AM 03/17/2023    9:02 AM  BMP  Glucose 70 - 99 mg/dL 841  324  401   BUN 6 - 20 mg/dL 25  25  27    Creatinine 0.61 - 1.24 mg/dL 0.27  2.53  6.64   Sodium 135 - 145 mmol/L 133  133  135   Potassium 3.5 - 5.1 mmol/L 4.0  4.2  4.5   Chloride 98 - 111 mmol/L 103  103  104   CO2 22 - 32 mmol/L 20  20  21    Calcium 8.9 - 10.3 mg/dL 8.5  8.4  8.5       12: Elevated BUN: follow-up BMP             -encourage fluids  -5/8 BUN stable 25, continue to encourage fluid intake  -Encourage fluid intake, recheck Monday  13: Leukocytosis: downward trend; follow-up CBC   -5/8 WBC slightly decreased to 13.5 today  -No signs of infection noted  14: Anemia: multifactorial/acute blood  loss: follow-up CBC  -5/8 HGB stable at 8.9   15: 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 Lajoyce Corners on 4/07 to have legs wrapped with Dynaflex   16 Right Eye irritation  -Lubricating drops ordered Q4h prn  17.  Elevated LFTs -Hold statin for a few days, decrease Tylenol to every 6 hours as needed, recheck Monday  18: Thrombocytosis  -Likely reactive, recheck Monday LOS: 3 days A FACE TO FACE EVALUATION WAS PERFORMED  Fanny Dance 03/21/2023, 7:52 AM

## 2023-03-21 NOTE — IPOC Note (Signed)
Overall Plan of Care [facility]) Patient Details Name: Jesse [name] MRN: [id number] DOB: 1961-03-24  Admitting Diagnosis: Osteomyelitis of left lower extremity [facility])  Hospital Problems: Principal Problem:   Osteomyelitis of left lower extremity (HCC)     Functional Problem List: Nursing Bowel, Medication Management, Safety, Pain, Endurance, Skin Integrity, Nutrition  PT Balance, Pain, Behavior, Edema, Safety  OT Balance, Sensory, Endurance, Motor, Pain, Skin Integrity  SLP    TR         Basic ADL's: OT Bathing, Dressing, Toileting     Advanced  ADL's: OT Simple Meal Preparation     Transfers: PT Bed Mobility, Bed to Chair, Car  OT Toilet, Tub/Shower     Locomotion: PT Ambulation, Wheelchair Mobility     Additional Impairments: OT None  SLP        TR      Anticipated Outcomes Item Anticipated Outcome  Self Feeding no goal  Swallowing      Basic self-care  mod I  Toileting  mod I   Bathroom Transfers mod I  Bowel/Bladder  manage bowel w mod I assist  Transfers  mod I  Locomotion  mod I  Communication     Cognition     Pain  < 4 with prns  Safety/Judgment  manage w cues   Therapy Plan: PT Intensity: Minimum of 1-2 x/day ,45 to 90 minutes PT Frequency: 5 out of 7 days PT Duration Estimated Length of Stay: 10-12 days OT Intensity: Minimum of 1-2 x/day, 45 to 90 minutes OT Frequency: 5 out of 7 days OT Duration/Estimated Length of Stay: 10-12 days     Team Interventions: Nursing Interventions Patient/Family Education, Bowel Management, Pain Management, Skin Care/Wound Management, Disease Management/Prevention, Medication Management, Discharge Planning  PT interventions Ambulation/gait training, Discharge planning, Cognitive remediation/compensation, DME/adaptive equipment instruction, Functional mobility training, Pain management, Splinting/orthotics, Therapeutic Activities, UE/LE Strength taining/ROM, Warden/ranger, Community  reintegration, Disease management/prevention, Neuromuscular re-education, Patient/family education, Skin care/wound management, Stair training, Therapeutic Exercise, UE/LE Coordination activities, Wheelchair propulsion/positioning  OT Interventions Warden/ranger, Discharge planning, Self Care/advanced ADL retraining, Pain management, Therapeutic Activities, Functional mobility training, Patient/family education, Therapeutic Exercise, Wheelchair propulsion/positioning, UE/LE Strength taining/ROM, Psychosocial support, Neuromuscular re-education, Community reintegration, DME/adaptive equipment instruction  SLP Interventions    TR Interventions    SW/CM Interventions Discharge Planning, Psychosocial Support, Patient/Family Education   Barriers to Discharge MD  Medical stability, Wound care, and Weight bearing restrictions  Nursing Decreased caregiver support, Home environment access/layout, Wound Care, Incontinence 1 level lste apt no rail solo; cousin will assist prn;pt reports mod indep with use of RW and wheelchair. states he gets out 1x/wk using moped to run errands. primarily sedentary, reports "sitting in the sunshine" as his hobby  PT Home environment access/layout, Decreased caregiver support, Wound Care, Lack of/limited family support, Weight bearing restrictions, Medication compliance, Behavior pain  OT Decreased caregiver support only intermittent supervision  SLP      SW Decreased caregiver support, Lack of/limited family support     Team Discharge Planning: Destination: PT-Home ,OT- Home , SLP-  Projected Follow-up: PT-Home health PT, OT-  Home health OT, SLP-  Projected Equipment Needs: PT-To be determined, OT- 3 in 1 bedside comode, Tub/shower bench, SLP-  Equipment Details: PT-pt has manual WC and RW at home, will need power wheelchair, wheelchair consult scheduled for tuesday, OT-drop arm BSC, TTB Patient/family involved in discharge planning: PT- Patient,   OT-Patient, SLP-   MD ELOS: 10-12 days Medical Rehab Prognosis:  Good Assessment: The  patient has been admitted for CIR therapies with the diagnosis of 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 The team will be addressing functional mobility, strength, stamina, balance, safety, adaptive techniques and equipment, self-care, bowel and bladder mgt, patient and caregiver education. Goals have been set at  Mod I at [facility] level PT/OT. Anticipated discharge destination is home.        See Team Conference Notes for weekly updates to the plan of care

## 2023-03-21 NOTE — Consult Note (Signed)
WOC consulted to update orders to manage exudate and suggestion for offloading  I have updated orders for topical care that includes silver hydrofiber for the draining wounds, top with ABD pads, kerlix. Daily  Request Hanger to evaluate for offloading boot/device.  It will be difficult to offload. Suggested possible adaptic devices which could be recommended by PT/OT   Re consult if needed, will not follow at this time. Thanks  Jerame Hedding [employer], RN,CWOCN, CNS, CWON-AP [phone])

## 2023-03-21 NOTE — Consult Note (Signed)
Neuropsychological Consultation Comprehensive Inpatient Rehab   Patient:   Jesse [name]   DOB:   12-11-1960  MR Number:  [id number]  Location:  [facility] [address] CENTER A [id number] [place] [address] Dept: [phone] Loc: [phone]           Date of Service:   03/13/2023  Start Time:   10 AM End Time:   11 AM  Provider/Observer:  Arley Phenix, Psy.D.       Clinical Neuropsychologist       Billing Code/Service: [phone]  Reason for Service:    Jesse [name] is a 62 year old male referred for neuropsychological consultation due to coping and adjustment with recent left BKA and current admission into the inpatient rehabilitation unit.  Patient has a past history of poorly managed diabetes, chronic left ankle wound.  Patient presented to the emergency department on 03/12/2023 complaining of increased pain and bloody drainage from his left foot.  Patient has a history of chronic bilateral lower extremity diabetic ulcers, peripheral vascular disease, lymphedema and vascular insufficiently.  Patient had recent hospitalization due to gangrene of left foot and MRI was consistent with osteomyelitis.  At the time, the patient had declined amputation.  Antibiotics were attempted again.  Patient ultimately agreed and was taken to the operating room on 5/4 undergoing left below the knee amputation.  Patient also has chronic right foot wound being addressed and negative for osteomyelitis.  During visit today, patient was pleasant and engaged but showed very limited understanding and comprehension of his medical status.  Patient was very concrete in his understanding but denied significant depression or anxiety symptoms.  Patient was unclear about factors leading to his infection and ultimate amputation attempting to attributed to a wide range of issues beyond his unmanaged diabetes.  Patient also was unable to describe  appropriate nutritional and dietary expectations for managing his diabetes.  Patient minimized issues he was having with his right foot.  There was a very strong odor to the room but patient appeared to be unfazed.  Patient did acknowledge that family members had urged him to seek care noting the amount of odor he was having emanate from his wounds.  I suspect the strong smell was coming from his right foot ulcerations.  HPI for the current admission:    HPI: Jesse [name] is a 62 year old diabetic male with a chronic left ankle wound managed as an outpatient by Dr. Lajoyce Corners. He presented to the emergency department on 03/12/2023 complaining of increased pain and bloody drainage from his left foot. He has a history of chronic bilateral lower extremity diabetic ulcers, peripheral vascular disease, lymphedema and vascular insufficiency. He had recently been hospitalized due to gangrene of the left foot and MRI from 4/27 was consistent with osteomyelitis. Initially declined amputation. Antibiotics were reinitiated including vancomycin and Zosyn. Orthopedic surgery consulted. He was taken to the operating room on 5/04 and underwent left below-knee amputation and placement of Prevena incisional dressing. Blood sugars are currently controlled on sliding scale insulin and Semglee. Chronic right foot wound also evaluated and negative for osteomyelitis. Offload with Prevalon boots. His A1c was last checked on 11/21/2022 and was 6.9%. He is status post right anterior tibial angioplasty by Dr. Chestine Spore on 4/25 and continues on aspirin, Plavix and statin. He has a history of chronic hyponatremia with baseline values of 1 31-1 34. Acute blood loss anemia atop chronic anemia.The patient requires inpatient medicine  and rehabilitation evaluations and services for ongoing dysfunction secondary to osteomyelitis left foot status post left BKA.   Medical History:   Past Medical History:  Diagnosis Date   AKI (acute kidney injury) (HCC)  01/27/2014   Alcohol abuse    Anasarca    Bilateral leg edema 02/07/2021   Cellulitis    Cellulitis and abscess 01/26/2014   Cellulitis of right foot 04/17/2021   Dental abscess 01/26/2014   Diabetes mellitus type 2 in obese    Diabetic foot infection (HCC) 04/08/2021   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 2 diabetes mellitus, limited to breakdown of skin (HCC)    Facial cellulitis 01/26/2014   HTN (hypertension)    Hypokalemia 01/28/2014   Infected dental carries 01/26/2014   Leukocytosis 01/27/2014   Osteomyelitis (HCC) 04/14/2021   Right foot infection          Patient Active Problem List   Diagnosis Date Noted   Osteomyelitis of left lower extremity (HCC) 03/18/2023   Gangrene of left foot (HCC) 03/12/2023   Renal insufficiency 03/12/2023   PVD (peripheral vascular disease) (HCC) 03/12/2023   Hyponatremia 03/12/2023   Normocytic anemia 03/12/2023   Lymphedema 03/04/2023   Eye injury 11/21/2022   Hyperkalemia 07/10/2022   Chronic painful diabetic neuropathy (HCC) 07/10/2022   Metabolic acidosis, normal anion gap (NAG) 07/10/2022   Hypomagnesemia 07/10/2022   Charcot's arthropathy 06/28/2022   Leukocytosis    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)    Severe protein-calorie malnutrition (HCC)    Diabetic foot infection (HCC) 06/22/2022   Sepsis (HCC) 06/22/2022   Cellulitis in diabetic foot (HCC) 04/17/2021   Osteomyelitis of left foot (HCC) 04/14/2021   HTN (hypertension) 04/08/2021   HLD (hyperlipidemia) 04/08/2021   Alcohol abuse    Diabetic ulcer of right foot (HCC)    Diabetes mellitus type II, controlled (HCC) 01/27/2014   AKI (acute kidney injury) (HCC) 01/27/2014    Behavioral Observation/Mental Status:   Jesse [name]  presents as a 62 y.o.-year-old Right handed African American Male who appeared his stated age. his dress was Appropriate and he was Fairly Groomed and his manners were  Appropriate to the situation.  his participation was indicative of Inattentive and Redirectable behaviors.  There were physical disabilities noted.  he displayed an appropriate level of cooperation and motivation.    Interactions:    Active Inattentive and Redirectable  Attention:   abnormal and attention span appeared shorter than expected for age  Memory:   abnormal; remote memory intact, recent memory impaired  Visuo-spatial:   not examined  Speech (Volume):  normal  Speech:   normal; normal  Thought Process:  Circumstantial and Tangential  Concrete, Circumstantial, and Distracted  Though Content:  WNL; not suicidal and not homicidal  Orientation:   person and place  Judgment:   Poor  Planning:   Poor  Affect:    Appropriate  Mood:    Euthymic  Insight:   Shallow  Intelligence:   low  Impression/DX:   Jesse [name] is a 62 year old male referred for neuropsychological consultation due to coping and adjustment with recent left BKA and current admission into the inpatient rehabilitation unit.  Patient has a past history of poorly managed diabetes, chronic left ankle wound.  Patient presented to the emergency department on 03/12/2023 complaining of increased pain and bloody drainage from his left foot.  Patient has a history of chronic bilateral lower extremity  diabetic ulcers, peripheral vascular disease, lymphedema and vascular insufficiently.  Patient had recent hospitalization due to gangrene of left foot and MRI was consistent with osteomyelitis.  At the time, the patient had declined amputation.  Antibiotics were attempted again.  Patient ultimately agreed and was taken to the operating room on 5/4 undergoing left below the knee amputation.  Patient also has chronic right foot wound being addressed and negative for osteomyelitis.  During visit today, patient was pleasant and engaged but showed very limited understanding and comprehension of his medical status.  Patient was  very concrete in his understanding but denied significant depression or anxiety symptoms.  Patient was unclear about factors leading to his infection and ultimate amputation attempting to attributed to a wide range of issues beyond his unmanaged diabetes.  Patient also was unable to describe appropriate nutritional and dietary expectations for managing his diabetes.  Patient minimized issues he was having with his right foot.  There was a very strong odor to the room but patient appeared to be unfazed.  Patient did acknowledge that family members had urged him to seek care noting the amount of odor he was having emanate from his wounds.  I suspect the strong smell was coming from his right foot ulcerations.  Disposition/Plan:  Today we worked on coping and adjustment with recent left BKA and ongoing infection and vascular issues with his right foot.  Patient is concerned about his capacity and ability going forward after he was discharged particularly around how he was going to be able to ambulate.  Patient somewhat unrealistic about what would be needed for prostatic as he has significant ongoing medical issues for his right foot and while his diabetes has been much better managed in the hospital there will need to be more specific and concrete explanations regarding how to manage his diabetes independently.  I would strongly encourage a referral to a dietitian that can address these issues if possible.          Electronically Signed   _______________________ Arley Phenix, Psy.D. Clinical Neuropsychologist

## 2023-03-21 NOTE — Progress Notes (Signed)
Occupational Therapy Session Note  Patient Details  Name: Jesse [name] MRN: [id number] Date of Birth: Oct 18, 1961  Today's Date: 03/21/2023 OT Individual Time: 0865-7846 OT Individual Time Calculation (min): 71 min    Short Term Goals: Week 1:  OT Short Term Goal 1 (Week 1): Pt will complete toileting tasks with (S) overall OT Short Term Goal 2 (Week 1): Pt will complete stand from the w/c with CGA OT Short Term Goal 3 (Week 1): Pt will complete toilet transfer using LRAD with (S)  Skilled Therapeutic Interventions/Progress Updates:    Pt received sitting with no c/o pain, agreeable to OT session. Reviewed notes that said to defer standing under darco shoe was delivered. Pt reporting they delivered the wrong size and is waiting on a bigger one. Initiated ADL retraining from seated level. He completed UB ADLs with assist for reaching back and occasional cueing for sequencing. He completed LB bathing with instructions for lateral leans to reach his bottom.  OT providing education on general skin care needs- skin is very dry and will continue to benefit from exfoliation and lotion application. With min cueing he was able to apply butt cream and lotion as needed. OT managed wound vac and then pt able to don shorts with min A to pull up posteriorly. Squat pivot to the w/c following bathing with (S). He completed oral care with a sponge d/t tooth pain with set up assist. He completed a lateral squat to the mat with (S). Worked on reaching his R foot while seated on the mat to allow pt to complete foot care at home. He was able to change his sock in this manner. Min cueing for positioning. He then completed 3x12 modified sit ups on the mat to address core stability and strength for carryover to ADLs. He returned to the w/c and to his room. He was left sitting up in the recliner with all needs met. Notified RN of his foot wound draining and odor.    Therapy Documentation Precautions:   Precautions Precautions: Fall Precaution Comments: L BKA, R foot wound, wound vac Required Braces or Orthoses: Other Brace Other Brace: L LE limb protector Restrictions Weight Bearing Restrictions: Yes LLE Weight Bearing: Non weight bearing  Therapy/Group: Individual Therapy  Crissie Reese 03/21/2023, 8:43 AM

## 2023-03-21 NOTE — Progress Notes (Signed)
Physical Therapy Session Note  Patient Details  Name: Jesse [name] MRN: [id number] Date of Birth: Mar 23, 1961  Today's Date: 03/21/2023 PT Individual Time: 1300-1400 PT Individual Time Calculation (min): 60 min   Short Term Goals: Week 1:  PT Short Term Goal 1 (Week 1): Therapist will initiate power wheelchair consult PT Short Term Goal 2 (Week 1): Pt will initiate gait training PT Short Term Goal 3 (Week 1): Pt will perform sit<>stand with CGA and LRAD PT Short Term Goal 4 (Week 1): Pt will perform bed to chair transfer with CGA and LRAD  Skilled Therapeutic Interventions/Progress Updates: Pt presents sitting in w/c and agreeable to therapy.  Pt has not received new Darco shoe, maintain no standing/gait until received.  Slight drainage from plantar surface R foot, Nursing will change bandage.  Pt wheeled to dayroom for time conservation.  Pt performed SB transfer w/c > mat table w/ supervision, pt able to lean and assist w/ placement.  Pt sat EOM and performed seated catching/bouncing/throwing green T-ball.  Limb protector removed and pt performed seated LAQ, hip flexion, isometric add, abduction w/ green T-band, lat rows and chest/tricep press w/ green T-band 3 x 10-15.  Pt transferred w/ SB mat > w/c (upward bias) w/ supervision after assist for placement.  Pt returned to room and remained sitting in w/c, wound vac plugged in and all needs in reach.     Therapy Documentation Precautions:  Precautions Precautions: Fall Precaution Comments: L BKA, R foot wound, wound vac Required Braces or Orthoses: Other Brace Other Brace: L LE limb protector Restrictions Weight Bearing Restrictions: Yes LLE Weight Bearing: Non weight bearing General:   Vital Signs:   Pain:0/10      Therapy/Group: Individual Therapy  [name] 03/21/2023, 2:01 PM

## 2023-03-21 NOTE — Progress Notes (Signed)
Occupational Therapy Session Note  Patient Details  Name: Jesse [name] MRN: [id number] Date of Birth: November 17, 1960  Today's Date: 03/21/2023 OT Individual Time: 1105-1205 OT Individual Time Calculation (min): 60 min    Short Term Goals: Week 1:  OT Short Term Goal 1 (Week 1): Pt will complete toileting tasks with (S) overall OT Short Term Goal 2 (Week 1): Pt will complete stand from the w/c with CGA OT Short Term Goal 3 (Week 1): Pt will complete toilet transfer using LRAD with (S)  Skilled Therapeutic Interventions/Progress Updates:   Pt up in recliner upon OT arrival. OT noted pt's w/c provided was 16 x 16 and pt 5'11" 196 lbs. OT retrieved 18 x 20 w/c and coordinated ROHO cushion, L amputee pad and positioned pt in that seating with + results. Pt transferred lateral scoot to w/c with min A and min cues for hand placement. Pt taken out of room to attempt short distance w/c skills. Pt reported discomfort B hands and only able to self propel 15 ft x 2 with rest. Secure chat to team to inquire about a pair of w/c gloves. Left pt up in w/c with all needs and nurse call button in reach. Pt refuses chair seat or belt alarm at this time and OT reinforced need for assist prior toi mobilizing.    Pain:  8/10 R foot with elevation, repositioning and distraction, positioned in fitting w/c with L residual limb on amputee pad/board and blanket support with 6/10 pain at end of session, reported discomfort in B hands with w/c propulsion very short distance   Therapy Documentation Precautions:  Precautions Precautions: Fall Precaution Comments: L BKA, R foot wound, wound vac Required Braces or Orthoses: Other Brace Other Brace: L LE limb protector Restrictions Weight Bearing Restrictions: Yes LLE Weight Bearing: Non weight bearing   Therapy/Group: Individual Therapy  Vicenta Dunning 03/21/2023, 7:32 AM

## 2023-03-22 DIAGNOSIS — M869 Osteomyelitis, unspecified: Secondary | ICD-10-CM | POA: Diagnosis not present

## 2023-03-22 LAB — GLUCOSE, CAPILLARY
Glucose-Capillary: 117 mg/dL — ABNORMAL HIGH (ref 70–99)
Glucose-Capillary: 119 mg/dL — ABNORMAL HIGH (ref 70–99)
Glucose-Capillary: 133 mg/dL — ABNORMAL HIGH (ref 70–99)
Glucose-Capillary: 271 mg/dL — ABNORMAL HIGH (ref 70–99)

## 2023-03-22 NOTE — Progress Notes (Signed)
PROGRESS NOTE   Subjective/Complaints: Asks if dressing will be changed today No other complaints this morning Discussed with nursing that wound vac may be removed today and shrinker donned afterward    Review of Systems  Constitutional:  Negative for chills, fever, malaise/fatigue and weight loss.  HENT:  Negative for congestion.   Respiratory:  Negative for shortness of breath.   Cardiovascular:  Negative for chest pain.  Gastrointestinal:  Negative for abdominal pain, constipation, nausea and vomiting.  Genitourinary:  Negative for dysuria.  Musculoskeletal:  Negative for joint pain.  Skin:  Negative for rash.  Neurological:  Positive for sensory change and weakness. Negative for headaches.     Objective:   No results found. No results for input(s): "WBC", "HGB", "HCT", "PLT" in the last 72 hours.  No results for input(s): "NA", "K", "CL", "CO2", "GLUCOSE", "BUN", "CREATININE", "CALCIUM" in the last 72 hours.   Intake/Output Summary (Last 24 hours) at 03/22/2023 1343 Last data filed at 03/22/2023 0735 Gross per 24 hour  Intake 596 ml  Output 1100 ml  Net -504 ml         Physical Exam: Vital Signs Blood pressure 113/70, pulse 82, temperature 98.6 F (37 C), temperature source Oral, resp. rate 15, height 6' (1.829 m), weight 89.1 kg, SpO2 100 %.  Physical Exam: Blood pressure 117/66, pulse 75, temperature 98.2 F (36.8 C), temperature source Oral, resp. rate 18, height 6' (1.829 m), weight 96.3 kg, SpO2 99 %. Gen: no distress, normal appearing HEENT: oral mucosa pink and moist, NCAT Cardio: Reg rate Chest: normal effort, normal rate of breathing Abd: soft, non-distended Extremities: Right heel ulcer, left BKA Psych: Pt's affect is appropriate. Pt is cooperative  Musculoskeletal:     Right lower leg: Edema present.     Comments: Right foot/ankle dressing intact; Wound vac to left residual limb with good  seal.  NO drainage in canister.  Skin:    Comments: Right heel ulcer, please see image- appears unchanged , wound does have chronic odor, dressing clean and dry this morning Neurological:     General: No focal deficit present.     Mental Status: He is alert and oriented to person, place, and time.     Comments: Alert and oriented x 3. Normal insight and awareness. Intact Memory. Normal language and speech. Cranial nerve exam unremarkable. MMT: 5/5 BLE, RLE grossly 4/5 prox to distal with some limitations distally. LLE limited by vac but grossly 3-4/5. Decreased LT RLE below mid calf.  Psychiatric:        Mood and Affect: Mood normal.      Photo was taken yesterday Wendi Maya   Assessment/Plan: 1. Functional deficits which require 3+ hours per day of interdisciplinary therapy in a comprehensive inpatient rehab setting. Physiatrist is providing close team supervision and 24 hour management of active medical problems listed below. Physiatrist and rehab team continue to assess barriers to discharge/monitor patient progress toward functional and medical goals  Care Tool:  Bathing    Body parts bathed by patient: Right arm, Left arm, Chest, Abdomen, Front perineal area, Right upper leg, Left upper leg, Right lower leg, Face, Buttocks   Body parts  bathed by helper: Buttocks Body parts n/a: Left lower leg (BKA)   Bathing assist Assist Level: Supervision/Verbal cueing     Upper Body Dressing/Undressing Upper body dressing   What is the patient wearing?: Pull over shirt    Upper body assist Assist Level: Supervision/Verbal cueing    Lower Body Dressing/Undressing Lower body dressing      What is the patient wearing?: Pants     Lower body assist Assist for lower body dressing: Minimal Assistance - Patient > 75%     Toileting Toileting    Toileting assist Assist for toileting: Moderate Assistance - Patient 50 - 74%     Transfers Chair/bed transfer  Transfers assist   Chair/bed transfer activity did not occur: N/A  Chair/bed transfer assist level: Supervision/Verbal cueing (SB)     Locomotion Ambulation   Ambulation assist   Ambulation activity did not occur: Safety/medical concerns (pt refused due to R LE pain/wounds)          Walk 10 feet activity   Assist  Walk 10 feet activity did not occur: Safety/medical concerns        Walk 50 feet activity   Assist Walk 50 feet with 2 turns activity did not occur: Safety/medical concerns         Walk 150 feet activity   Assist Walk 150 feet activity did not occur: Safety/medical concerns         Walk 10 feet on uneven surface  activity   Assist Walk 10 feet on uneven surfaces activity did not occur: Safety/medical concerns         Wheelchair     Assist Is the patient using a wheelchair?: Yes Type of Wheelchair: Manual    Wheelchair assist level: Supervision/Verbal cueing      Wheelchair 50 feet with 2 turns activity    Assist        Assist Level: Supervision/Verbal cueing   Wheelchair 150 feet activity     Assist      Assist Level: Supervision/Verbal cueing   Blood pressure 113/70, pulse 82, temperature 98.6 F (37 C), temperature source Oral, resp. rate 15, height 6' (1.829 m), weight 89.1 kg, SpO2 100 %.  Medical Problem List and Plan: 1. 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  Antithrombotics: -DVT/anticoagulation:  Pharmaceutical: Lovenox             -antiplatelet therapy: Aspirin and Plavix    3. Pain: Tylenol, oxycodone as needed             -continue gabapentin 100 mg TID             -pt reports reasonable pain control at  present 4. Mood/Behavior/Sleep: LCSW to evaluate and provide emotional support             -antipsychotic agents: n/a   5. Neuropsych/cognition: This patient is capable of making decisions on his own behalf.   6. Wound vac:              -Prevena vac comes off on 5/11---placed order for removal             -  right heel ulcer; off-load and continue daily dressing changes  -Dicussed with Dr. Lajoyce Corners 5/9, ordered ARAFO to wear at all times ok to WB in [employer]   7. Fluids/Electrolytes/Nutrition: Routine Is and Os and follow-up chemistries             -continue MVI and Vit C, zinc supplementation             -continue Juven   9: Hyperlipidemia: -Hold statin for a few days, recheck Monday   10: DM-2: CBGs QID; carb modified diet             -continue Semglee 5 units at bedtime  D/c bedtime ISS given excellent control and since patient will not have this at home   CBG (last 3)  Recent Labs    03/21/23 2046 03/22/23 0548 03/22/23 1202  GLUCAP 141* 117* 119*       11: Hyponatremia: trend/follow-up BMP -5/8 Na stable 133 recheck Monday     Latest Ref Rng & Units 03/19/2023    4:59 AM 03/18/2023    8:50 AM 03/17/2023    9:02 AM  BMP  Glucose 70 - 99 mg/dL 914  782  956   BUN 6 - 20 mg/dL 25  25  27    Creatinine 0.61 - 1.24 mg/dL 2.13  0.86  5.78   Sodium 135 - 145 mmol/L 133  133  135   Potassium 3.5 - 5.1 mmol/L 4.0  4.2  4.5   Chloride 98 - 111 mmol/L 103  103  104   CO2 22 - 32 mmol/L 20  20  21    Calcium 8.9 - 10.3 mg/dL 8.5  8.4  8.5       12: Elevated BUN: follow-up BMP             -encourage fluids  -5/8 BUN stable 25, continue to encourage fluid intake  -Encourage fluid intake, recheck Monday  13: Leukocytosis: downward trend; follow-up CBC   -5/8 WBC slightly decreased to 13.5 today  -No signs of infection noted  14: Anemia: multifactorial/acute blood loss: follow-up CBC  -5/8 HGB stable at 8.9   15: 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 Lajoyce Corners on 4/07 to have legs wrapped with Dynaflex   16 Right Eye irritation  -continue Lubricating drops ordered Q4h prn  17.  Elevated LFTs -Hold statin for a few days, decrease Tylenol to every 6 hours as needed, recheck Monday  18: Thrombocytosis  -Likely reactive, recheck Monday LOS: 4 days A FACE TO FACE EVALUATION WAS PERFORMED  Drema Pry Schuyler Olden 03/22/2023, 1:43 PM

## 2023-03-22 NOTE — Progress Notes (Signed)
Occupational Therapy Session Note  Patient Details  Name: MONFORD NASCA MRN: [id number] Date of Birth: November 02, 1961  {CHL IP REHAB OT TIME CALCULATIONS:[id number]}   Short Term Goals: Week 1:  OT Short Term Goal 1 (Week 1): Pt will complete toileting tasks with (S) overall OT Short Term Goal 2 (Week 1): Pt will complete stand from the w/c with CGA OT Short Term Goal 3 (Week 1): Pt will complete toilet transfer using LRAD with (S)  Skilled Therapeutic Interventions/Progress Updates:  Pt received *** for skilled OT session with focus on ***. Pt agreeable to interventions, demonstrating overall *** mood. Pt reported ***/10 pain, stating "***" in reference to ***. OT offering intermediate rest breaks and positioning suggestions throughout session to address pain/fatigue and maximize participation/safety in session.    Pt remained *** with all immediate needs met at end of session. Pt continues to be appropriate for skilled OT intervention to promote further functional independence.   Therapy Documentation Precautions:  Precautions Precautions: Fall Precaution Comments: L BKA, R foot wound, wound vac Required Braces or Orthoses: Other Brace Other Brace: L LE limb protector Restrictions Weight Bearing Restrictions: Yes LLE Weight Bearing: Non weight bearing    Therapy/Group: Individual Therapy  Lou Cal, OTR/L, MSOT  03/22/2023, 3:25 PM

## 2023-03-22 NOTE — Progress Notes (Addendum)
Orthopedic Tech Progress Note Patient Details:  [name] [birth year] [id number]  Routine order for R heel offloading boot called into [facility]. Order has incorrect laterality listed as pt had a L BKA. R side is correct side.  Patient ID: Jesse [name], male   DOB: 04-Jan-1961, 62 y.o.   MRN: [id number]  Docia Furl 03/22/2023, 1:57 PM

## 2023-03-23 DIAGNOSIS — M869 Osteomyelitis, unspecified: Secondary | ICD-10-CM | POA: Diagnosis not present

## 2023-03-23 LAB — GLUCOSE, CAPILLARY
Glucose-Capillary: 115 mg/dL — ABNORMAL HIGH (ref 70–99)
Glucose-Capillary: 183 mg/dL — ABNORMAL HIGH (ref 70–99)
Glucose-Capillary: 136 mg/dL — ABNORMAL HIGH (ref 70–99)

## 2023-03-23 MED ORDER — HYDROCERIN EX CREA
TOPICAL_CREAM | Freq: Two times a day (BID) | CUTANEOUS | Status: DC
  Administered 2023-03-30: 1 via TOPICAL
  Filled 2023-03-23 (×2): qty 113

## 2023-03-23 NOTE — Progress Notes (Signed)
Physical Therapy Session Note  Patient Details  Name: Jesse [name] MRN: [id number] Date of Birth: 03/12/1961  Today's Date: 03/23/2023 PT Individual Time: 0800-0859, 4098-1191 PT Individual Time Calculation (min): 59 min, 42 minute  Short Term Goals: Week 1:  PT Short Term Goal 1 (Week 1): Therapist will initiate power wheelchair consult PT Short Term Goal 2 (Week 1): Pt will initiate gait training PT Short Term Goal 3 (Week 1): Pt will perform sit<>stand with CGA and LRAD PT Short Term Goal 4 (Week 1): Pt will perform bed to chair transfer with CGA and LRAD  Skilled Therapeutic Interventions/Progress Updates:     Pt supine in bed upon arrival. Pt agreeable to therpay. Pt denies any pain. Darco has not arrived, nurse reports it should arrive on Monday.   Therapist donned R LE PRAFO. Pt performed slide board transfer with set up assist.    Wound vac removed as of yesterday. Education provided on donning/doffing L LE limb guard, L LE shrinker. Pt doffed shrinker with supervision, and donned with min A for untangling straps, therapist provided demonstration for technique to reduce straps from untangling.   Pt doffed shrinker with mod A as shrinker has adhered to residual limb/staples, nurse present to apply saline, to avoid any damage to staples.   Therapist provided education and demonstration for cleaning shrinker with luke warm water, and changing shrinker every other day. Education provided for purpose of shrinker.   Pt has mild bleeding from one staple, nurse and Dr. Carlis Abbott present and agreed to applying dressing and ACE bandage today, and applying shrinker tomorrow once dried.   Pt educated on importance of keeping L LE straightened to reduce risk of contractures and to assist with future prosthetic fitting.   Pt performed 1x10 L LE seated hip flexion, 1x10 L LE quad sets, 1x10.   Pt seated in WC at end of session with all needs within reach. Pt refusing to wear seatbelt alarm  but reports he will not get up without assistance.  Nursing notified.   Treatment Session 2   Pt seated in [facility] upon arrival with nurse in room. Pt agreeable to therapy. Pt denies any pain. Pt wearing R LE PRAFO.   Pt performed slide board transfer x2 with supervision, with verbal cues provided for pt to position wheelchair, remove leg rests, arm rests and place/remove slide board.   Pt seated EOM, pt doffed DARCO with supervision, verbal instructional cues provided.   Pt performed skin inspection of R LE with mirror. Education provided on importance of daily skin inspection to reduce risk of infection, amputation. Pt verbalized understanding. Education provided to perform on B LE. Education provided upon discharge to notify PCP immediately if pt notices any new wounds. Pt verbalized understanding. Upon pt performing skin inspection, pt reports, "it looks good."   Pt performed 1x10 R LE ankle pumps, B LE LAQ, B LE SLR, B LE sidelying hip abduction, B LE prone hip extension; with therapist providing verbal and tactile cueing for proper form, and to reduce compensation. Upon performing bed mobility, pt had dry skin covering mat table, pt reports, "I think it is from the soap they use here" Education provided that it is not from the soap, that this has been there prior to pt arriving at hospital. Therapist concerned about pt ability to perform skin care/dressing changes at home. Notified OT to practice with pt next session.   Pt donned/doffed L LE limb protector, therapist assessing to see what pt recalls from  previous session. Pt donned/doffed with supervision for proper placement around limb prior to strapping with increased time.      Pt seated in [facility] with all needs within reach. Pt refusing seatbelt alarm, nursing aware.    Therapy Documentation Precautions:  Precautions Precautions: Fall Precaution Comments: L BKA, R foot wound, wound vac Required Braces or Orthoses: Other Brace Other  Brace: L LE limb protector Restrictions Weight Bearing Restrictions: Yes LLE Weight Bearing: Non weight bearing    Therapy/Group: Individual Therapy  [facility] Ambrose [place], [place], DPT  03/23/2023, 8:53 AM

## 2023-03-23 NOTE — Progress Notes (Signed)
Physical Therapy Session Note  Patient Details  Name: Jesse [name] MRN: [id number] Date of Birth: Jan 29, 1961  Today's Date: 03/23/2023 PT Individual Time: 1105-1200 PT Individual Time Calculation (min): 55 min   Short Term Goals: Week 1:  PT Short Term Goal 1 (Week 1): Therapist will initiate power wheelchair consult PT Short Term Goal 2 (Week 1): Pt will initiate gait training PT Short Term Goal 3 (Week 1): Pt will perform sit<>stand with CGA and LRAD PT Short Term Goal 4 (Week 1): Pt will perform bed to chair transfer with CGA and LRAD  Skilled Therapeutic Interventions/Progress Updates:     Pt received seated in [facility] and agrees to therapy. Reports pain in R foot. Number not provided. PT provides rest breaks and repositioning to manage pain. Session focused on [facility] mobility training and strengthening/endurance training of bilateral upper extremities. Pt self propels WC indoors over levels surfaces 2x50' with upper extremities. Pt then transported outside for practice with WC mobility over unlevel and varying surfaces. Pt propels WC outdoors 5x50' with cues for optimal propulsion technique, 1x75' with minA due to graded surface.  Seated rest break. Pt performs upper extremity ergometer for additional upper extremity strengthening and core work. Pt completes x7:00 in forward diretion with cues for posture and body mechanics.   Pt left seated in WC with all needs within reach.   Therapy Documentation Precautions:  Precautions Precautions: Fall Precaution Comments: L BKA, R foot wound, wound vac Required Braces or Orthoses: Other Brace Other Brace: L LE limb protector Restrictions Weight Bearing Restrictions: Yes LLE Weight Bearing: Non weight bearing    Therapy/Group: Individual Therapy  Beau Fanny, PT, DPT 03/23/2023, 3:06 PM

## 2023-03-23 NOTE — Progress Notes (Signed)
PROGRESS NOTE   Subjective/Complaints: Still has not received [facility] boot, discussed with nursing and this is due to be delivered tomorrow Residual limb examined and healing well    Review of Systems  Constitutional:  Negative for chills, diaphoresis, fever, malaise/fatigue and weight loss.  HENT:  Negative for congestion.   Respiratory:  Negative for shortness of breath.   Cardiovascular:  Negative for chest pain.  Gastrointestinal:  Negative for abdominal pain, constipation, nausea and vomiting.  Genitourinary:  Negative for dysuria.  Musculoskeletal:  Negative for joint pain.  Skin:  Negative for rash.  Neurological:  Positive for sensory change and weakness. Negative for headaches.     Objective:   No results found. No results for input(s): "WBC", "HGB", "HCT", "PLT" in the last 72 hours.  No results for input(s): "NA", "K", "CL", "CO2", "GLUCOSE", "BUN", "CREATININE", "CALCIUM" in the last 72 hours.   Intake/Output Summary (Last 24 hours) at 03/23/2023 1008 Last data filed at 03/23/2023 0400 Gross per 24 hour  Intake 360 ml  Output 1675 ml  Net -1315 ml         Physical Exam: Vital Signs Blood pressure 117/72, pulse 81, temperature 98.4 F (36.9 C), resp. rate 18, height 6' (1.829 m), weight 89.1 kg, SpO2 98 %.  Physical Exam: Blood pressure 117/66, pulse 75, temperature 98.2 F (36.8 C), temperature source Oral, resp. rate 18, height 6' (1.829 m), weight 96.3 kg, SpO2 99 %. Gen: no distress, normal appearing HEENT: oral mucosa pink and moist, NCAT Cardio: Reg rate Chest: normal effort, normal rate of breathing Abd: soft, non-distended Psych: pleasant, normal affect Skin: intact Extremities: Right heel ulcer, left BKA Psych: Pt's affect is appropriate. Pt is cooperative  Musculoskeletal:     Right lower leg: Edema present.     Comments: Right foot/ankle dressing intact; Wound vac to left residual  limb with good seal.  NO drainage in canister.  Skin:    Comments: Right heel ulcer, please see image- appears unchanged , wound does have chronic odor, dressing clean and dry this morning Neurological:     General: No focal deficit present.     Mental Status: He is alert and oriented to person, place, and time.     Comments: Alert and oriented x 3. Normal insight and awareness. Intact Memory. Normal language and speech. Cranial nerve exam unremarkable. MMT: 5/5 BLE, RLE grossly 4/5 prox to distal with some limitations distally. LLE limited by vac but grossly 3-4/5. Decreased LT RLE below mid calf.  Psychiatric:        Mood and Affect: Mood normal.      Photo was taken yesterday Wendi Maya   Assessment/Plan: 1. Functional deficits which require 3+ hours per day of interdisciplinary therapy in a comprehensive inpatient rehab setting. Physiatrist is providing close team supervision and 24 hour management of active medical problems listed below. Physiatrist and rehab team continue to assess barriers to discharge/monitor patient progress toward functional and medical goals  Care Tool:  Bathing    Body parts bathed by patient: Right arm, Left arm, Chest, Abdomen, Front perineal area, Right upper leg, Left upper leg, Right lower leg, Face, Buttocks   Body  parts bathed by helper: Buttocks Body parts n/a: Left lower leg (BKA)   Bathing assist Assist Level: Supervision/Verbal cueing     Upper Body Dressing/Undressing Upper body dressing   What is the patient wearing?: Pull over shirt    Upper body assist Assist Level: Supervision/Verbal cueing    Lower Body Dressing/Undressing Lower body dressing      What is the patient wearing?: Pants     Lower body assist Assist for lower body dressing: Minimal Assistance - Patient > 75%     Toileting Toileting    Toileting assist Assist for toileting: Moderate Assistance - Patient 50 - 74%     Transfers Chair/bed  transfer  Transfers assist  Chair/bed transfer activity did not occur: N/A  Chair/bed transfer assist level: Supervision/Verbal cueing (SB)     Locomotion Ambulation   Ambulation assist   Ambulation activity did not occur: Safety/medical concerns (pt refused due to R LE pain/wounds)          Walk 10 feet activity   Assist  Walk 10 feet activity did not occur: Safety/medical concerns        Walk 50 feet activity   Assist Walk 50 feet with 2 turns activity did not occur: Safety/medical concerns         Walk 150 feet activity   Assist Walk 150 feet activity did not occur: Safety/medical concerns         Walk 10 feet on uneven surface  activity   Assist Walk 10 feet on uneven surfaces activity did not occur: Safety/medical concerns         Wheelchair     Assist Is the patient using a wheelchair?: Yes Type of Wheelchair: Manual    Wheelchair assist level: Supervision/Verbal cueing      Wheelchair 50 feet with 2 turns activity    Assist        Assist Level: Supervision/Verbal cueing   Wheelchair 150 feet activity     Assist      Assist Level: Supervision/Verbal cueing   Blood pressure 117/72, pulse 81, temperature 98.4 F (36.9 C), resp. rate 18, height 6' (1.829 m), weight 89.1 kg, SpO2 98 %.  Medical Problem List and Plan: 1. 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  Impaired mobility: continue Lovenox             -antiplatelet therapy: Aspirin and Plavix    3. Pain: Tylenol, oxycodone as needed             -continue gabapentin 100 mg TID             -pt reports reasonable pain control at present 4.  Mood/Behavior/Sleep: LCSW to evaluate and provide emotional support             -antipsychotic agents: n/a   5. Neuropsych/cognition: This patient is capable of making decisions on his own behalf.   6. Wound vac:              -Prevena vac comes off on 5/11---placed order for removal, residual limb  examined and healing well             -right heel ulcer; off-load and continue daily dressing changes  -Dicussed with Dr. Lajoyce Corners 5/9, ordered ARAFO to wear at all times ok to WB in [facility]   7. Fluids/Electrolytes/Nutrition: Routine Is and Os and follow-up chemistries             -continue MVI and Vit C, zinc supplementation             -continue Juven   9: Hyperlipidemia: -Hold statin for a few days, recheck Monday   10: DM-2: CBGs QID; carb modified diet             -continue Semglee 5 units at bedtime  D/c bedtime ISS given excellent control and since patient will not have this at home   CBG (last 3)  Recent Labs    03/22/23 1610 03/22/23 2145 03/23/23 0633  GLUCAP 133* 271* 115*       11: Hyponatremia: trend/follow-up BMP -5/8 Na stable 133 recheck Monday     Latest Ref Rng & Units 03/19/2023    4:59 AM 03/18/2023    8:50 AM 03/17/2023    9:02 AM  BMP  Glucose 70 - 99 mg/dL 161  096  045   BUN 6 - 20 mg/dL 25  25  27    Creatinine 0.61 - 1.24 mg/dL 4.09  8.11  9.14   Sodium 135 - 145 mmol/L 133  133  135   Potassium 3.5 - 5.1 mmol/L 4.0  4.2  4.5   Chloride 98 - 111 mmol/L 103  103  104   CO2 22 - 32 mmol/L 20  20  21    Calcium 8.9 - 10.3 mg/dL 8.5  8.4  8.5       12: Elevated BUN: follow-up BMP             -encourage fluids  -5/8 BUN stable 25, continue to encourage fluid intake  -Encourage fluid intake, recheck Monday  13: Leukocytosis: downward trend; follow-up CBC   -5/8 WBC slightly decreased to 13.5 today  -No signs of infection noted  14: Anemia: multifactorial/acute blood loss: follow-up CBC  -5/8 HGB stable at 8.9   15: 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 Lajoyce Corners on 4/07 to have legs wrapped with Dynaflex   16 Right Eye irritation  -continue Lubricating drops ordered Q4h prn  17.  Elevated LFTs -Hold statin for a few days, decrease Tylenol to every 6 hours as needed, recheck Monday  18: Thrombocytosis  -Likely reactive, recheck Monday  19. Dry skin: Eucerin ordered LOS: 5 days A FACE TO FACE EVALUATION WAS PERFORMED  Jaiceon Collister P Jearlene Bridwell 03/23/2023, 10:08 AM

## 2023-03-24 ENCOUNTER — Telehealth: Payer: Self-pay

## 2023-03-24 DIAGNOSIS — E11621 Type 2 diabetes mellitus with foot ulcer: Secondary | ICD-10-CM | POA: Diagnosis not present

## 2023-03-24 DIAGNOSIS — L97519 Non-pressure chronic ulcer of other part of right foot with unspecified severity: Secondary | ICD-10-CM | POA: Diagnosis not present

## 2023-03-24 DIAGNOSIS — M869 Osteomyelitis, unspecified: Secondary | ICD-10-CM | POA: Diagnosis not present

## 2023-03-24 DIAGNOSIS — Z89512 Acquired absence of left leg below knee: Secondary | ICD-10-CM | POA: Diagnosis not present

## 2023-03-24 LAB — CBC
HCT: 27.9 % — ABNORMAL LOW (ref 39.0–52.0)
Hemoglobin: 8.7 g/dL — ABNORMAL LOW (ref 13.0–17.0)
MCH: 31.6 pg (ref 26.0–34.0)
MCHC: 31.2 g/dL (ref 30.0–36.0)
MCV: 101.5 fL — ABNORMAL HIGH (ref 80.0–100.0)
Platelets: 529 10*3/uL — ABNORMAL HIGH (ref 150–400)
RBC: 2.75 MIL/uL — ABNORMAL LOW (ref 4.22–5.81)
RDW: 14 % (ref 11.5–15.5)
WBC: 10.4 10*3/uL (ref 4.0–10.5)
nRBC: 0 % (ref 0.0–0.2)

## 2023-03-24 LAB — COMPREHENSIVE METABOLIC PANEL
ALT: 60 U/L — ABNORMAL HIGH (ref 0–44)
AST: 33 U/L (ref 15–41)
Albumin: 1.9 g/dL — ABNORMAL LOW (ref 3.5–5.0)
Alkaline Phosphatase: 163 U/L — ABNORMAL HIGH (ref 38–126)
Anion gap: 9 (ref 5–15)
BUN: 27 mg/dL — ABNORMAL HIGH (ref 6–20)
CO2: 22 mmol/L (ref 22–32)
Calcium: 9.1 mg/dL (ref 8.9–10.3)
Chloride: 106 mmol/L (ref 98–111)
Creatinine, Ser: 1.07 mg/dL (ref 0.61–1.24)
GFR, Estimated: >60 mL/min (ref >60)
Glucose, Bld: 142 mg/dL — ABNORMAL HIGH (ref 70–99)
Potassium: 4.4 mmol/L (ref 3.5–5.1)
Sodium: 137 mmol/L (ref 135–145)
Total Bilirubin: 0.4 mg/dL (ref 0.3–1.2)
Total Protein: 7.7 g/dL (ref 6.5–8.1)

## 2023-03-24 LAB — GLUCOSE, CAPILLARY
Glucose-Capillary: 112 mg/dL — ABNORMAL HIGH (ref 70–99)
Glucose-Capillary: 127 mg/dL — ABNORMAL HIGH (ref 70–99)
Glucose-Capillary: 108 mg/dL — ABNORMAL HIGH (ref 70–99)
Glucose-Capillary: 142 mg/dL — ABNORMAL HIGH (ref 70–99)

## 2023-03-24 MED ORDER — ENSURE MAX PROTEIN PO LIQD
11.0000 [oz_av] | Freq: Every day | ORAL | Status: DC
  Administered 2023-03-24 – 2023-04-01 (×9): 11 [oz_av] via ORAL

## 2023-03-24 NOTE — Progress Notes (Deleted)
[employer] [facility] boot delivered to this patient's room. MD Natale Lay states that this boot is not appropriate and should use PRAFO boot. Orders updated.

## 2023-03-24 NOTE — Progress Notes (Signed)
Physical Therapy Session Note  Patient Details  Name: Jesse [name] MRN: [id number] Date of Birth: [birth year]  Today's Date: 03/24/2023 PT Individual Time: 1015-1040 PT Individual Time Calculation (min): 25 min   Short Term Goals: Week 1:  PT Short Term Goal 1 (Week 1): Therapist will initiate power wheelchair consult PT Short Term Goal 2 (Week 1): Pt will initiate gait training PT Short Term Goal 3 (Week 1): Pt will perform sit<>stand with CGA and LRAD PT Short Term Goal 4 (Week 1): Pt will perform bed to chair transfer with CGA and LRAD  Skilled Therapeutic Interventions/Progress Updates:    Chart reviewed and pt agreeable to therapy. Pt received seated in WC with no c/o pain. Also of note, DRACO boot already donned. Session focused on functional transfers and [facility] management to promote safe home access. Pt initiated session with sit to stand using CGA + RW + VC for safety and education on sequencing. Pt then completed 6 sit to stands with CGA progressing to supervision + RW + demonstration of good WC safety between each stand by unlocking/relocking breaks. Pt them completed 176ft WC propulsion with supervision + increased time, using 6 mins and then VC for navigation of space in room. Session education emphasized need to alarm in room for safety with pt initially not agreeable, but agreeable to chair alarm with education. At end of session, pt was left seated in [facility] with alarm engaged, nurse call bell and all needs in reach.     Therapy Documentation Precautions:  Precautions Precautions: Fall Precaution Comments: L BKA, R foot wound, wound vac Required Braces or Orthoses: Other Brace Other Brace: L LE limb protector Restrictions Weight Bearing Restrictions: Yes LLE Weight Bearing: Non weight bearing General:      Therapy/Group: Individual Therapy  Dionne Milo, PT, DPT 03/24/2023, 10:46 AM

## 2023-03-24 NOTE — Plan of Care (Signed)
Discontinued standing and ambulation goals as pt is now NWB B LE.  Problem: RH Balance Goal: LTG Patient will maintain dynamic standing balance (PT) Description: LTG:  Patient will maintain dynamic standing balance with assistance during mobility activities (PT) Outcome: Not Applicable   Problem: Sit to Stand Goal: LTG:  Patient will perform sit to stand with assistance level (PT) Description: LTG:  Patient will perform sit to stand with assistance level (PT) Outcome: Not Applicable   Problem: RH Ambulation Goal: LTG Patient will ambulate in home environment (PT) Description: LTG: Patient will ambulate in home environment, # of feet with assistance (PT). Outcome: Not Applicable

## 2023-03-24 NOTE — Telephone Encounter (Signed)
Delle Reining with rehabilitation at [facility] is calling about the patient's right leg (had amputation on the left): On the right heel, he has an ulcer that has a strong odor and is "mushy." There is a copious amount of drainage along with that odor. She is wanting to know if Dr. Lajoyce Corners could come and check out the right foot -- he is in 4W10. Also, she would like to know if the patient should be on antibiotics for wound prophylaxis.    Rinaldo Cloud can be reached on her cell: #[phone].

## 2023-03-24 NOTE — Progress Notes (Signed)
Orthopedic Tech Progress Note Patient Details:  Jesse [name] Mar 11, 1961 [id number]  Called in order to [employer] for a PRAFO BOOT  Patient ID: Jesse [name], male   DOB: Mar 09, 1961, 62 y.o.   MRN: [id number]  [name] 03/24/2023, 2:01 PM

## 2023-03-24 NOTE — Progress Notes (Signed)
Discussed difficulty with off loading right foot--he will need to be toe touch to avoid ulcerated areas. Discussed keeping weight off and focusing on transfers only--lateral scoot and/or SB with patient. His concern was that he does housework from his wheelchair and motorized chair will help him manage that at NWB status. Reached out to [employer], LCSW who reported that [facility] evaluation has been scheduled for this week. Looked at his foot dressing which had been soaked through with serous drainage with strong odor. Dressing removed and note that patient has boggy, fibronecrotic tissue on his heel-->he reports that he stepped on something which has caused disruption of the skin graft that he had a couple of years ago. Reached out to Dr. Lajoyce Corners with question of need for debridement and/or antibiotics for wound management. Have concerns about patient's ability to manage wound independently after discharge.

## 2023-03-24 NOTE — Progress Notes (Addendum)
Physical Therapy Session Note  Patient Details  Name: Jesse [name] MRN: [id number] Date of Birth: 1961/02/23  Today's Date: 03/24/2023 PT Individual Time: 1104-1200 PT Individual Time Calculation (min): 56 min   Short Term Goals: Week 1:  PT Short Term Goal 1 (Week 1): Therapist will initiate power wheelchair consult PT Short Term Goal 2 (Week 1): Pt will initiate gait training PT Short Term Goal 3 (Week 1): Pt will perform sit<>stand with CGA and LRAD PT Short Term Goal 4 (Week 1): Pt will perform bed to chair transfer with CGA and LRAD  Skilled Therapeutic Interventions/Progress Updates:      Pt seated in [facility] receiving wound care from nurse, opted to use ace bandage with figure 8 wrapping as shrinker still wet. Pt agreeable to therapy. Pt wearing L LE DARCO today. Pt denies any pain.   Pt doffed limb protector with supervision, and donned limb protector with min A as foam piece initially fell out, verbal cues provided for proper placement of black piece around patient knee, verbal cues provided for proper placement. Pt required increased time.   Pt donned R leg rest with min A to get holes lined up on prongs, verbal cuing provided for technique. Pt donned L leg rest with supervision.   Pt performed sit to stand with RW and CGA to stabilize RW. Pt ambulated 1x12 feet and 1x10 feet with RW and superivison, verbal cues provided for safety with RW.   Pt performed stand pivot with RW and supervision.   Pt performed sit<>stand x10 from mat table with RW and CGA/sup, verbal cues provided for technique with B UE for controlled descent with stand<>sit.   Pt requires verbal cuing to lock brakes on WC prior to standing. Therapist utilizing questioning cue as reminder to pt.   Discussed pt wheelchair consultation for power wheelchair on Thursday. Discussed with pt to determine possibility of installing a ramp. Pt reports he is able to contact his landlord.   Pt seated in WC at end of session  with chair alarm on and needs within reach at end of session.   Therapy Documentation Precautions:  Precautions Precautions: Fall Precaution Comments: L BKA, R foot wound, wound vac Required Braces or Orthoses: Other Brace Other Brace: L LE limb protector Restrictions Weight Bearing Restrictions: Yes LLE Weight Bearing: Non weight bearing    Therapy/Group: Individual Therapy  [employer] Ambrose [place], [place], DPT  03/24/2023, 11:59 AM

## 2023-03-24 NOTE — Progress Notes (Signed)
Physical Therapy Session Note  Patient Details  Name: Jesse [name] MRN: [id number] Date of Birth: [birth year]  Today's Date: 03/24/2023 PT Individual Time: 0865-7846 PT Individual Time Calculation (min): 60 min   Short Term Goals: Week 1:  PT Short Term Goal 1 (Week 1): Therapist will initiate power wheelchair consult PT Short Term Goal 2 (Week 1): Pt will initiate gait training PT Short Term Goal 3 (Week 1): Pt will perform sit<>stand with CGA and LRAD PT Short Term Goal 4 (Week 1): Pt will perform bed to chair transfer with CGA and LRAD  Skilled Therapeutic Interventions/Progress Updates:  Patient greeted supine in bed and agreeable to PT treatment session. Patient requesting to use the restroom prior to starting session. Patient transitioned from semi-reclined to sitting EOB with supv. Patient then performed lateral transfer via slideboard and CGA for safety- VC throughout for improved sequencing and placement/removal of board. While seated on the drop-arm bedside commode, patient had continent void and BM. Patient was able to lean side to side in order to perform pericare. Patient transferred back to sitting EOB same as above and was able to pull pants over hips from a seated position. Patient transitioned back to semi-reclined in bed with supv. While supine in bed therapist un-wrapped R foot and went through the process of outlining his wounds in order for proper set-up of [facility] boot- Where the wounds are located, therapist removed the pieces of the shoe insert in order to off-load wounds when WB. Therapist re-wrapped R foot with abd pad and gauze with RN notified and aware. Therapist noted area of blood on ace wrap of residual limb in limb guard- RN also notified and aware of this. Patient transferred from EOB to wheelchair via slideboard and set-up assistance- VC for improved head/hips relationship with good effort noted. Patient left sitting upright in wheelchair in room with call bell  within reach and all needs met- Patient denied using the the posey belt at this time, however thoroughly educated regarding not getting up on his own and patient agreeable.   Therapy Documentation Precautions:  Precautions Precautions: Fall Precaution Comments: L BKA, R foot wound, wound vac Required Braces or Orthoses: Other Brace Other Brace: L LE limb protector Restrictions Weight Bearing Restrictions: Yes LLE Weight Bearing: Non weight bearing  Pain: Reports mild pain in R foot, no number given, however reports improvements in symptoms with [facility] boot donned.    Therapy/Group: Individual Therapy  Dereke Neumann 03/24/2023, 7:51 AM

## 2023-03-24 NOTE — Progress Notes (Signed)
PROGRESS NOTE   Subjective/Complaints: PRAFO does not appear in the room today. Had ordered extra large one Friday, will order new one today.       Review of Systems  Constitutional:  Negative for chills.  HENT:  Negative for congestion.   Eyes:  Negative for pain.  Respiratory:  Negative for shortness of breath.   Cardiovascular:  Negative for chest pain.  Gastrointestinal:  Negative for abdominal pain, nausea and vomiting.  Genitourinary:  Negative for dysuria and urgency.  Musculoskeletal:  Negative for joint pain.  Neurological:  Positive for sensory change and weakness. Negative for headaches.     Objective:   No results found. No results for input(s): "WBC", "HGB", "HCT", "PLT" in the last 72 hours.  No results for input(s): "NA", "K", "CL", "CO2", "GLUCOSE", "BUN", "CREATININE", "CALCIUM" in the last 72 hours.   Intake/Output Summary (Last 24 hours) at 03/24/2023 0815 Last data filed at 03/24/2023 1610 Gross per 24 hour  Intake 1798 ml  Output 1150 ml  Net 648 ml         Physical Exam: Vital Signs Blood pressure 125/78, pulse 79, temperature 98.4 F (36.9 C), temperature source Oral, resp. rate 18, height 6' (1.829 m), weight 89.1 kg, SpO2 100 %.  Physical Exam: Blood pressure 117/66, pulse 75, temperature 98.2 F (36.8 C), temperature source Oral, resp. rate 18, height 6' (1.829 m), weight 96.3 kg, SpO2 99 %. Gen: no distress, normal appearing HEENT: oral mucosa pink and moist, NCAT Cardio: Reg rate Chest: normal effort, normal rate of breathing Abd: soft, non-distended Psych: pleasant, normal affect Skin: intact Extremities: Right heel ulcer, left BKA Psych: Pt's affect is appropriate. Pt is cooperative  Musculoskeletal:     Right lower leg: Edema present.     Comments: Right foot/ankle dressing intact; Wound vac to left residual limb has been removed.  NO drainage in canister.  Skin:     Comments: Right heel ulcer, please see image- appears unchanged , wound does have chronic odor, dressing clean and dry this morning Neurological:     General: No focal deficit present.     Mental Status: He is alert and oriented to person, place, and time.     Comments: Alert and oriented x 3. Normal insight and awareness. Intact Memory. Normal language and speech. Cranial nerve exam unremarkable. MMT: 5/5 BLE, RLE grossly 4/5 prox to distal with some limitations distally. LLE limited by vac but grossly 3-4/5. Decreased LT RLE below mid calf.  Psychiatric:        Mood and Affect: Mood normal.         Assessment/Plan: 1. Functional deficits which require 3+ hours per day of interdisciplinary therapy in a comprehensive inpatient rehab setting. Physiatrist is providing close team supervision and 24 hour management of active medical problems listed below. Physiatrist and rehab team continue to assess barriers to discharge/monitor patient progress toward functional and medical goals  Care Tool:  Bathing    Body parts bathed by patient: Right arm, Left arm, Chest, Abdomen, Front perineal area, Right upper leg, Left upper leg, Right lower leg, Face, Buttocks   Body parts bathed by helper: Buttocks Body parts n/a:  Left lower leg (BKA)   Bathing assist Assist Level: Supervision/Verbal cueing     Upper Body Dressing/Undressing Upper body dressing   What is the patient wearing?: Pull over shirt    Upper body assist Assist Level: Supervision/Verbal cueing    Lower Body Dressing/Undressing Lower body dressing      What is the patient wearing?: Pants     Lower body assist Assist for lower body dressing: Minimal Assistance - Patient > 75%     Toileting Toileting    Toileting assist Assist for toileting: Moderate Assistance - Patient 50 - 74%     Transfers Chair/bed transfer  Transfers assist  Chair/bed transfer activity did not occur: N/A  Chair/bed transfer assist level:  Supervision/Verbal cueing (SB)     Locomotion Ambulation   Ambulation assist   Ambulation activity did not occur: Safety/medical concerns (pt refused due to R LE pain/wounds)          Walk 10 feet activity   Assist  Walk 10 feet activity did not occur: Safety/medical concerns        Walk 50 feet activity   Assist Walk 50 feet with 2 turns activity did not occur: Safety/medical concerns         Walk 150 feet activity   Assist Walk 150 feet activity did not occur: Safety/medical concerns         Walk 10 feet on uneven surface  activity   Assist Walk 10 feet on uneven surfaces activity did not occur: Safety/medical concerns         Wheelchair     Assist Is the patient using a wheelchair?: Yes Type of Wheelchair: Manual    Wheelchair assist level: Supervision/Verbal cueing      Wheelchair 50 feet with 2 turns activity    Assist        Assist Level: Supervision/Verbal cueing   Wheelchair 150 feet activity     Assist      Assist Level: Supervision/Verbal cueing   Blood pressure 125/78, pulse 79, temperature 98.4 F (36.9 C), temperature source Oral, resp. rate 18, height 6' (1.829 m), weight 89.1 kg, SpO2 100 %.  Medical Problem List and Plan: 1. 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  Impaired mobility: continue Lovenox             -antiplatelet therapy: Aspirin and Plavix    3. Pain: Tylenol, oxycodone as needed             -continue gabapentin 100 mg TID             -pt reports reasonable pain control at present 4. Mood/Behavior/Sleep: LCSW  to evaluate and provide emotional support             -antipsychotic agents: n/a   5. Neuropsych/cognition: This patient is capable of making decisions on his own behalf.   6. Wound vac:              -Prevena vac comes off on 5/11---placed order  for removal, residual limb examined and healing well             -right heel ulcer; off-load and continue daily dressing changes  -Dicussed with Dr. Lajoyce Corners 5/9, ordered ARAFO to wear at all times ok to WB in [facility], otherwise NWB     7. Fluids/Electrolytes/Nutrition: Routine Is and Os and follow-up chemistries             -continue MVI and Vit C, zinc supplementation             -continue Juven   9: Hyperlipidemia: -Hold statin for a few days, recheck Monday   10: DM-2: CBGs QID; carb modified diet             -continue Semglee 5 units at bedtime  D/c bedtime ISS given excellent control and since patient will not have this at home  -Controlled    CBG (last 3)  Recent Labs    03/23/23 1616 03/23/23 2137 03/24/23 0531  GLUCAP 183* 136* 112*       11: Hyponatremia: trend/follow-up BMP -5/8 Na stable 133 -5/13 Na stable 137     Latest Ref Rng & Units 03/24/2023    7:47 AM 03/19/2023    4:59 AM 03/18/2023    8:50 AM  BMP  Glucose 70 - 99 mg/dL 161  096  045   BUN 6 - 20 mg/dL 27  25  25    Creatinine 0.61 - 1.24 mg/dL 4.09  8.11  9.14   Sodium 135 - 145 mmol/L 137  133  133   Potassium 3.5 - 5.1 mmol/L 4.4  4.0  4.2   Chloride 98 - 111 mmol/L 106  103  103   CO2 22 - 32 mmol/L 22  20  20    Calcium 8.9 - 10.3 mg/dL 9.1  8.5  8.4       12: Elevated BUN: follow-up BMP             -encourage fluids  -5/8 BUN stable 25, continue to encourage fluid intake  -5/13 Cr down to 1.07  13: Leukocytosis: downward trend; follow-up CBC   -5/8 WBC slightly decreased to 13.5 today  -No signs of infection noted  14: Anemia: multifactorial/acute blood loss: follow-up CBC  -5/8 HGB stable at 8.9  -5/13 HGB stable at 8.7    15: 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 Lajoyce Corners on 4/07 to have legs wrapped with Dynaflex   16 Right Eye irritation  -continue Lubricating drops ordered Q4h prn  17.  Elevated LFTs -Hold statin for a few days, decrease Tylenol to every 6 hours as needed, -AST down to 33, ALT still 60   18: Thrombocytosis  -5/13 529 plt today, likely reactive   19. Dry skin: Eucerin ordered   LOS: 6 days A FACE TO FACE EVALUATION WAS PERFORMED  Fanny Dance 03/24/2023, 8:15 AM

## 2023-03-24 NOTE — Telephone Encounter (Signed)
Please see the message below.

## 2023-03-24 NOTE — Progress Notes (Signed)
Occupational Therapy Session Note  Patient Details  Name: Jesse [name] MRN: [id number] Date of Birth: [birth year]  Today's Date: 03/24/2023 OT Individual Time: 1350-1415 OT Individual Time Calculation (min): 25 min    Short Term Goals: Week 1:  OT Short Term Goal 1 (Week 1): Pt will complete toileting tasks with (S) overall OT Short Term Goal 2 (Week 1): Pt will complete stand from the w/c with CGA OT Short Term Goal 3 (Week 1): Pt will complete toilet transfer using LRAD with (S)  Skilled Therapeutic Interventions/Progress Updates:  Skilled OT intervention completed with focus on wound care, DC planning. Pt received seated in w/c with nursing present assisting with wound care. Pt agreeable to session. No pain reported.  Pt and nursing with confusion about status for WB on R foot due to severe wounds. DARCO shoe in room with insole that off loads selected areas, however per MD, pt to use PRAFO vs DARCO. Reviewed best plan for wound healing, safety with functional transfers and DC plan with PA.   Plan is to keep pt NWB on R foot (despite orthotic worn), along with L limb, to prevent further injury to severe wounds. OT advocated that pt is not to stand/transfer with PRAFO on R foot as this is not safe nor functional. Reinforced this to pt with education provided about PRAFO use for bed time and lateral scoot transfers only however pt with questionable awareness and carryover.  OT donned PRAFO on R foot for off loading effect when seated in w/c, total A needed. Safety plan adjusted and nurse/primary PT notified of status to reflect current medical status and future transfers. Pt will need reinforcement on PRAFO use and lateral scoot transfers for DC vs ambulation.  Pt remained seated in w/c, with chair alarm on/activated, and with all needs in reach at end of session.   Therapy Documentation Precautions:  Precautions Precautions: Fall Precaution Comments: L BKA, R foot wound, wound  vac Required Braces or Orthoses: Other Brace Other Brace: L LE limb protector Restrictions Weight Bearing Restrictions: Yes LLE Weight Bearing: Non weight bearing    Therapy/Group: Individual Therapy  Melvyn Novas, MS, OTR/L  03/24/2023, 2:40 PM

## 2023-03-25 DIAGNOSIS — L97519 Non-pressure chronic ulcer of other part of right foot with unspecified severity: Secondary | ICD-10-CM | POA: Diagnosis not present

## 2023-03-25 DIAGNOSIS — E11621 Type 2 diabetes mellitus with foot ulcer: Secondary | ICD-10-CM | POA: Diagnosis not present

## 2023-03-25 DIAGNOSIS — Z89512 Acquired absence of left leg below knee: Secondary | ICD-10-CM | POA: Diagnosis not present

## 2023-03-25 DIAGNOSIS — M869 Osteomyelitis, unspecified: Secondary | ICD-10-CM | POA: Diagnosis not present

## 2023-03-25 LAB — GLUCOSE, CAPILLARY
Glucose-Capillary: 95 mg/dL (ref 70–99)
Glucose-Capillary: 159 mg/dL — ABNORMAL HIGH (ref 70–99)
Glucose-Capillary: 112 mg/dL — ABNORMAL HIGH (ref 70–99)
Glucose-Capillary: 147 mg/dL — ABNORMAL HIGH (ref 70–99)

## 2023-03-25 MED ORDER — DOXYCYCLINE HYCLATE 100 MG PO TABS
100.0000 mg | ORAL_TABLET | Freq: Two times a day (BID) | ORAL | Status: DC
  Administered 2023-03-25 – 2023-04-02 (×15): 100 mg via ORAL
  Filled 2023-03-25 (×15): qty 1

## 2023-03-25 NOTE — Progress Notes (Signed)
Occupational Therapy Session Note  Patient Details  Name: Jesse [name] MRN: [id number] Date of Birth: June 08, 1961  Today's Date: 03/25/2023 OT Individual Time: 1415-1530 OT Individual Time Calculation (min): 75 min    Short Term Goals: Week 1:  OT Short Term Goal 1 (Week 1): Pt will complete toileting tasks with (S) overall OT Short Term Goal 2 (Week 1): Pt will complete stand from the w/c with CGA OT Short Term Goal 3 (Week 1): Pt will complete toilet transfer using LRAD with (S)  Skilled Therapeutic Interventions/Progress Updates:   Pt seen for skilled OT session with pt up in new PWC. Pt reports he was "evicted" from his apt and was making calls to help upon OT arrival. Offered support while determining needs for session. Pt denied need for commode for BM but requested OT to train in use of urinal while in PWC. Pt able to navigate to retrieve urinal hooked to bed with OT focus on ensuring safe mobility through narrow spaces as R LE in PRAFO needs to be negotiated to to ensure clearance at all times. Pt reports some issues with vision but appeared to comensate failrly well. Pt able to place urinal after OT educ in use of seating at slight recline and management of clothing. Pt able to complete voiding with set up only. See Flowsheets for data. Pt then instructed in joystick management and safety to navigate under sink to access for hand washing. Pt performed hand washing with S. Pt then navigated to narrowest doorway on unit for R turn mngt into ortho gym. Pt needed close S open hallways with cues to adjust speed up[/dowhn then min A/CGA and cues for tighter turn x 2. Pt then navigated throughout doorways in and out of apt kitchen with CGA and min cues always reinforcing R foot clearance. Final part of session spent instructing hoe pt can perform full tilt for pressure relief as well as recline for pressure relief. Instructed on power charge cord and pt able to connect x 2 trials with min cues.  Pt refused chair alarm and left with needs and nurse call button on lap reminded to call nurse with any needs.   Pain:  5/10 in R foot down to 3/10 with distraction, TIS education for full recline and re-positioning and pressure relief  Therapy Documentation Precautions:  Precautions Precautions: Fall Precaution Comments: L BKA, R foot wound, wound vac Required Braces or Orthoses: Other Brace Other Brace: L LE limb protector Restrictions Weight Bearing Restrictions: Yes LLE Weight Bearing: Non weight bearing    Therapy/Group: Individual Therapy  Vicenta Dunning 03/25/2023, 7:52 AM

## 2023-03-25 NOTE — Progress Notes (Addendum)
Patient ID: Jesse [name], male   DOB: [birth year], 62 y.o.   MRN: [id number]  Long conversation with pt and Jesse [name] via speaker phone who feels he needs to go to a facility to get the care he needs. Pt feels he can manage at home with a power chair, but currently does not have a ramp to get into his apartment. John feels his apartment is not large enough to accommodate a power chair, but his friend-Jesse [name] is here and feels a power chair would fit in his apartment. But he also feels pt needs to go to a facility and feels he needs to be forced too. Discussed with both Jesse [name] and Jesse [name] pt can not be forced to go to a SNF unless he agree's. He currently does not agree. He has not paid his rent for this month and he will work on this. Jesse [name] feels if he needs too he will start the process of eviction but has not currently started this. He will call pt's brother-Jesse [name] who is a long distance truck driver and discuss with him. He reports he feels the same way as he does regarding pt needing to go to a facility. Will see if his brother-Jesse [name] will call him and discuss what he feels he should do.Continue to work on best discharge plan for pt. He was seen by psychiatry 4/25 and they felt he has capacity to make his own decisions.  10:35 am-Spoke with Jesse [name]-sister to discuss her concerns.  She feels he needs to go to a facility to get care. She is aware of him not agreeing to this. Asked her to reach out to him or a family member who he listens too.

## 2023-03-25 NOTE — Progress Notes (Signed)
Nurse attempted to do wound care this morning. Patient refused said he did not do that at night only during the day. Nurse educated patient that wound care is to be done twice daily. Patient understood, but still wanted to sleep more and wait on day shift. Nurse informed patient she would notify oncoming nurse.

## 2023-03-25 NOTE — Progress Notes (Signed)
Patient ID: Jesse [name], male   DOB: June 24, 1961, 62 y.o.   MRN: [id number]  Spoke with Harmon Pier who reports pt can not go home at discharge. He will have to go to a facility.Discussed pt would need to agree to this and currently has not. He feels he can manage at home even if the landlord does not want him there he has not evicted him. Pt also as capacity to make his own decisions even if not the best decision for him. Family to talk with pt regarding discharge plan.

## 2023-03-25 NOTE — Progress Notes (Signed)
Occupational Therapy Session Note  Patient Details  Name: GREGROY LAHAIE MRN: [id number] Date of Birth: 1961-04-17  Today's Date: 03/25/2023 OT Individual Time: 0865-7846 OT Individual Time Calculation (min): 45 min    Short Term Goals: Week 1:  OT Short Term Goal 1 (Week 1): Pt will complete toileting tasks with (S) overall OT Short Term Goal 2 (Week 1): Pt will complete stand from the w/c with CGA OT Short Term Goal 3 (Week 1): Pt will complete toilet transfer using LRAD with (S) Week 2:     Skilled Therapeutic Interventions/Progress Updates:    1:1 When entered the room SW, pt and pt's landlord have a discussion about home environment and concerns and have questions answered.   Pt reports that his manual chair at home is too big and reports fatigue from moving it around and has had conversations about wanting a power chair.  PT is now nonweightbearing bilaterally and is doing SB transfers.  Introduced pt to power chair and its features and took it for a test drive. Pt able to perform transfer via slide board from manual w/c to bed to power chair with close supervision with A for placement of the board. Talked about helpful features of a power chair to help him.  Also discussed ordering a hospital bed (has a air mattress right now), bariatric BSC  for lateral surface for lateral leans and slide board for safety.  Pt required min A to back up chair next to bed but was able to forward steer around the unit.   Pt left sitting up in power chair; turned off with seat belt donned.   Therapy Documentation Precautions:  Precautions Precautions: Fall Precaution Comments: L BKA, R foot wound, wound vac Required Braces or Orthoses: Other Brace Other Brace: L LE limb protector Restrictions Weight Bearing Restrictions: Yes RLE Weight Bearing: Non weight bearing LLE Weight Bearing: Non weight bearing   Pain: No reports of pain in session .   Therapy/Group: Individual Therapy  Roney Mans [facility] 03/25/2023, 12:03 PM

## 2023-03-25 NOTE — Progress Notes (Signed)
Occupational Therapy Note  Patient Details  Name: [name] MRN: [id number] Date of Birth: 11/28/1960    The following the equipment is recommended by occupational therapy to increase pt's ability to perform ADL and decr burden of care: bariatric  drop arm BSC  for lateral surface for lateral leans and slide board for safety. Pt is non weightbearing bilaterally.      Roney Mans [facility] 03/25/2023, 12:09 PM

## 2023-03-25 NOTE — Progress Notes (Addendum)
PROGRESS NOTE   Subjective/Complaints: Patient was seen by Dr. Lajoyce Corners this morning.  MRI without definitive osteomyelitis but plan for debridement with wound VAC placement tomorrow.  Patient with no additional concerns this morning.  He reports pain is under control.    Review of Systems  Constitutional:  Negative for fever and malaise/fatigue.  HENT:  Negative for congestion.   Respiratory:  Negative for shortness of breath.   Cardiovascular:  Negative for chest pain.  Gastrointestinal:  Negative for abdominal pain, constipation, nausea and vomiting.  Genitourinary:  Negative for dysuria.  Musculoskeletal:  Negative for joint pain.  Skin:  Negative for rash.  Neurological:  Positive for sensory change and weakness. Negative for speech change.     Objective:   No results found. Recent Labs    03/24/23 0747  WBC 10.4  HGB 8.7*  HCT 27.9*  PLT 529*    Recent Labs    03/24/23 0747  NA 137  K 4.4  CL 106  CO2 22  GLUCOSE 142*  BUN 27*  CREATININE 1.07  CALCIUM 9.1     Intake/Output Summary (Last 24 hours) at 03/25/2023 0835 Last data filed at 03/25/2023 0454 Gross per 24 hour  Intake 534 ml  Output 1675 ml  Net -1141 ml         Physical Exam: Vital Signs Blood pressure 131/77, pulse 77, temperature 97.9 F (36.6 C), temperature source Oral, resp. rate 18, height 6' (1.829 m), weight 89.1 kg, SpO2 100 %.  Physical Exam: Blood pressure 117/66, pulse 75, temperature 98.2 F (36.8 C), temperature source Oral, resp. rate 18, height 6' (1.829 m), weight 96.3 kg, SpO2 99 %. Gen: no distress, normal appearing, sitting in wheelchair HEENT: oral mucosa pink and moist, NCAT Cardio: RRR Chest: normal effort, normal rate of breathing Abd: soft, non-distended, positive bowel sounds Extremities: Right heel ulcer, left BKA Psych: Pt's affect is appropriate. Pt is cooperative  Musculoskeletal:     Right lower  leg: Edema present.     Comments:  Wound vac to left residual limb has been removed.  Wearing limb protector on left lower extremity. Skin:    Comments: Right heel ulcer-with dressing intact wearing PR AFO Neurological:     General: No focal deficit present.     Mental Status: He is alert and oriented to person, place, and time.     Comments: Alert and oriented x 3. Normal insight and awareness. Intact Memory. Normal language and speech. Cranial nerve exam unremarkable. MMT: 5/5 BLE, RLE grossly 4/5 prox to distal with some limitations distally. LLE limited by vac but grossly 3-4/5. Decreased LT RLE below mid calf.  Psychiatric:        Mood and Affect: Appropriate, normal affect        Assessment/Plan: 1. Functional deficits which require 3+ hours per day of interdisciplinary therapy in a comprehensive inpatient rehab setting. Physiatrist is providing close team supervision and 24 hour management of active medical problems listed below. Physiatrist and rehab team continue to assess barriers to discharge/monitor patient progress toward functional and medical goals  Care Tool:  Bathing    Body parts bathed by patient: Right arm, Left arm,  Chest, Abdomen, Front perineal area, Right upper leg, Left upper leg, Right lower leg, Face, Buttocks   Body parts bathed by helper: Buttocks Body parts n/a: Left lower leg (BKA)   Bathing assist Assist Level: Supervision/Verbal cueing     Upper Body Dressing/Undressing Upper body dressing   What is the patient wearing?: Pull over shirt    Upper body assist Assist Level: Supervision/Verbal cueing    Lower Body Dressing/Undressing Lower body dressing      What is the patient wearing?: Pants     Lower body assist Assist for lower body dressing: Minimal Assistance - Patient > 75%     Toileting Toileting    Toileting assist Assist for toileting: Moderate Assistance - Patient 50 - 74%     Transfers Chair/bed transfer  Transfers  assist  Chair/bed transfer activity did not occur: N/A  Chair/bed transfer assist level: Supervision/Verbal cueing (SB)     Locomotion Ambulation   Ambulation assist   Ambulation activity did not occur: Safety/medical concerns (pt refused due to R LE pain/wounds)          Walk 10 feet activity   Assist  Walk 10 feet activity did not occur: Safety/medical concerns        Walk 50 feet activity   Assist Walk 50 feet with 2 turns activity did not occur: Safety/medical concerns         Walk 150 feet activity   Assist Walk 150 feet activity did not occur: Safety/medical concerns         Walk 10 feet on uneven surface  activity   Assist Walk 10 feet on uneven surfaces activity did not occur: Safety/medical concerns         Wheelchair     Assist Is the patient using a wheelchair?: Yes Type of Wheelchair: Manual    Wheelchair assist level: Supervision/Verbal cueing      Wheelchair 50 feet with 2 turns activity    Assist        Assist Level: Supervision/Verbal cueing   Wheelchair 150 feet activity     Assist      Assist Level: Supervision/Verbal cueing   Blood pressure 131/77, pulse 77, temperature 97.9 F (36.6 C), temperature source Oral, resp. rate 18, height 6' (1.829 m), weight 89.1 kg, SpO2 100 %.  Medical Problem List and Plan: 1. 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  However discussed with patient I think he would have very difficult  time caring for himself at his apartment, unlikely to be able to care for his wounds effectively.  Discussed concerns that if wounds are not taking care of this could increase the risk of additional surgeries and amputations.  Discussed looking into SNF, patient says he will consider and let us know later today.  Will likely need to delay his discharge until early next week due to upcoming surgery.  2.  Impaired mobility: continue Lovenox             -antiplatelet therapy: Aspirin and Plavix    3. Pain: Tylenol, oxycodone as needed             -continue gabapentin 100 mg TID             -pt reports reasonable pain control at present  4. Mood/Behavior/Sleep: LCSW to evaluate and provide emotional support             -antipsychotic agents: n/a   5. Neuropsych/cognition: This patient is capable of making decisions on his own behalf.   6. Wound vac:              -Prevena vac comes off on 5/11---placed order for removal, residual limb examined and healing well             -right heel ulcer; off-load and continue daily dressing changes  -Dicussed with Dr. Lajoyce Corners 5/9, ordered ARAFO to wear at all times ok to WB in [facility], otherwise NWB  -He continues to have drainage from his right foot wound, drainage with strong odor, last Dr. Lajoyce Corners for recommendations regarding wound management  -Debridement scheduled with Dr. Lajoyce Corners tomorrow for right heel     7. Fluids/Electrolytes/Nutrition: Routine Is and Os and follow-up chemistries             -continue MVI and Vit C, zinc supplementation             -continue Juven   9: Hyperlipidemia: -Hold statin for a few days, recheck Monday -5/14 statin has been restarted   10: DM-2: CBGs QID; carb modified diet             -continue Semglee 5 units at bedtime  D/c bedtime ISS given excellent control and since patient will not have this at home  -5/15 well-controlled overall   CBG (last 3)  Recent Labs    03/24/23 1654 03/24/23 2103 03/25/23 0603  GLUCAP  108* 142* 95       11: Hyponatremia: trend/follow-up BMP -5/8 Na stable 133 -5/1 sodium stable at 136 6     Latest Ref Rng & Units 03/27/2023    6:58 AM 03/24/2023    7:47 AM 03/19/2023    4:59 AM  BMP  Glucose 70 - 99 mg/dL 960  454  098   BUN 6 - 20 mg/dL 35  27  25   Creatinine 0.61 - 1.24 mg/dL 1.19  1.47  8.29   Sodium 135 - 145 mmol/L 136  137  133   Potassium 3.5 - 5.1 mmol/L 4.4  4.4  4.0   Chloride 98 - 111 mmol/L 105  106  103   CO2 22 - 32 mmol/L 23  22  20    Calcium 8.9 - 10.3 mg/dL 9.2  9.1  8.5       12: Elevated BUN: follow-up BMP             -encourage fluids  -5/8 BUN stable 25, continue to encourage fluid intake  -5/13 Cr down to 1.07  13: Leukocytosis: downward trend; follow-up CBC  -5/16 WBC stable at 9.2  14: Anemia: multifactorial/acute blood loss: follow-up CBC  -5/8 HGB stable at 8.9  -5/16 Hgb stable at 9.2   15: 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 Lajoyce Corners on 4/07 to have legs wrapped with Dynaflex  16 Right Eye irritation  -continue Lubricating drops ordered Q4h prn  17.  Elevated LFTs -Hold statin for a few days, decrease Tylenol to every 6 hours as needed,  -AST down to 33, ALT still 60  -5/16 AST and ALT improved today  18: Thrombocytosis  -5/13 529 plt today, likely reactive   19. Dry skin: Eucerin ordered   LOS: 7 days A FACE TO FACE EVALUATION WAS PERFORMED  Fanny Dance 03/25/2023, 8:35 AM

## 2023-03-25 NOTE — Progress Notes (Addendum)
Physical Therapy Weekly Progress Note  Patient Details  Name: Jesse [name] MRN: [id number] Date of Birth: 11/06/1961  Beginning of progress report period: Mar 19, 2023 End of progress report period: Mar 25, 2023  Today's Date: 03/25/2023 PT Individual Time: 0830-0935 PT Individual Time Calculation (min): 65 min  and Today's Date: 03/25/2023 PT Missed Time: 10 Minutes Missed Time Reason: Other (Comment) (pt eating breakfast)  Patient has met 2 of 4 short term goals.  Discontinued standing and ambulation goal as pt is now NWB on R LE in addition to L LE to facilitate improved healing of wounds on R LE. Power wheelchair assessment scheduled for 5/14. Pt has multiple barriers  to pt discharge including home environment, no support, and pt ability to perform wound care.  Pt home enviornment  has 1 step to enter, and the bathroom is not accessible via wheelchair, pt requires wound care for B LE and has no support to assist with wound care.   Patient continues to demonstrate the following deficits muscle weakness, decreased problem solving, and difficulty maintaining precautions and therefore will continue to benefit from skilled PT intervention to increase functional independence with mobility.  Patient progressing toward long term goals..  Continue plan of care.  PT Short Term Goals Week 1:  PT Short Term Goal 1 (Week 1): Therapist will initiate power wheelchair consult (Wheelchair assessment scheduled for 5/16) PT Short Term Goal 1 - Progress (Week 1): Progressing toward goal PT Short Term Goal 2 (Week 1): Pt will initiate gait training PT Short Term Goal 2 - Progress (Week 1): Discontinued (comment) (Discontinued as pt now NWB R LE) PT Short Term Goal 3 (Week 1): Pt will perform sit<>stand with CGA and LRAD (Discontinued as pt now NWB R LE) PT Short Term Goal 3 - Progress (Week 1): Discontinued (comment) PT Short Term Goal 4 (Week 1): Pt will perform bed to chair transfer with CGA and  LRAD PT Short Term Goal 4 - Progress (Week 1): Met Week 2:  PT Short Term Goal 1 (Week 2): STG=LTG 2/2 ELOS  Skilled Therapeutic Interventions/Progress Updates:      Pt seated in manual WC upon arrival receiving wound care from nurse with R LE PRAFO on. Pt agreeable to therapy after finishing breakfast. PT MISSED 10 MINUTES OF THERAPY. (EATING BREAKFAST). Pt reports 6/10 pain in RLE, premedicated. Pt provided rest breaks and repsoitioning as needed for pain.   Education provided for R LE NWB to facilitate improved healing of wounds on R LE. Pt verablized understanding.   Pt informed me that he was evicted yesterday by landlord. Notified social work.   Treatment session focused on L LE skin inspection, donning/doffing shrinker, limb protector, and safety awareness with slide board transfer.   Pt utilized mirror to perform skin inspection with supervision, education provided to perform skin inspection daily upon discharge and to notify doctor if pt notices any new wounds, bleeding, abnormal discharge.   Therapist assessed pt recall of previous education provided regarding purpose of shrinker, purpose of limb protector, how to clean shrinker, frequency of cleaning shrinker. Pt demos poor recall. Pt reiterated education with pt reporting teach back. Will continue to assess daily.   Pt donned shrinker with supervision and increased time. Pt donned limb protector with supervision and increased time.   Pt positioned manual wheelchair with min A next to mat table to prep for slideboard transfer. Pt performed mass practice of slide board transfer from manual WC to mat table with supervision,   verbal cues provided for safety with placement of slide board, and NWBing R LE. Initially provided moderate instructional cuing, progressed to minimal questioning cuing with repeated trials.   Pt seated in WC at end of session with chair alarm on, and needs within reach at end of session.   Therapy  Documentation Precautions:  Precautions Precautions: Fall Precaution Comments: L BKA, R foot wound, wound vac Required Braces or Orthoses: Other Brace Other Brace: L LE limb protector Restrictions Weight Bearing Restrictions: Yes LLE Weight Bearing: Non weight bearing  Therapy/Group: Individual Therapy  Josiah Wojtaszek Netha Dafoe, PT, DPT  03/25/2023, 7:38 AM  

## 2023-03-26 ENCOUNTER — Inpatient Hospital Stay (HOSPITAL_COMMUNITY): Payer: Medicaid Other

## 2023-03-26 LAB — GLUCOSE, CAPILLARY
Glucose-Capillary: 146 mg/dL — ABNORMAL HIGH (ref 70–99)
Glucose-Capillary: 117 mg/dL — ABNORMAL HIGH (ref 70–99)
Glucose-Capillary: 164 mg/dL — ABNORMAL HIGH (ref 70–99)
Glucose-Capillary: 179 mg/dL — ABNORMAL HIGH (ref 70–99)

## 2023-03-26 MED ORDER — LIVING WELL WITH DIABETES BOOK
Freq: Once | Status: AC
  Filled 2023-03-26: qty 1

## 2023-03-26 NOTE — Progress Notes (Signed)
Physical Therapy Session Note  Patient Details  Name: Jesse [name] MRN: [id number] Date of Birth: 1960/11/17  Today's Date: 03/26/2023 PT Individual Time: 4098-1191 PT Individual Time Calculation (min): 33 min   Short Term Goals: Week 2:  PT Short Term Goal 1 (Week 2): STG=LTG 2/2 ELOS  Skilled Therapeutic Interventions/Progress Updates:      Therapy Documentation Precautions:  Precautions Precautions: Fall Precaution Comments: L BKA, R foot wound, wound vac Required Braces or Orthoses: Other Brace Other Brace: L LE limb protector Restrictions Weight Bearing Restrictions: Yes RLE Weight Bearing: Non weight bearing LLE Weight Bearing: Non weight bearing   Pt received semi-reclined in bed with nurse present. Pt requests to utilize bed pan for toileting. Pt (S) for safety with rolling and for removal of scrub pants. PT positioned bed pan and pt continent of bowel, documented in flowsheet. Pt dependent for posterior peri-care and supervision for anterior peri-care. Pt (S) for upper body dressing and min A for lower body dressing. Pt total A for board placement and (S) for slide board transfer to [facility]. Pt left seated in PWC at bedside with nurse tech present. Pt without reports of pain in session.     Therapy/Group: Individual Therapy  Truitt Leep Truitt Leep PT, DPT  03/26/2023, 7:27 AM

## 2023-03-26 NOTE — Progress Notes (Signed)
Living Well with Diabetes booklet given to and discussed with pt. Answered questions pt had. Materials left at bedside for pt to reference. Mylo Red, LPN

## 2023-03-26 NOTE — Progress Notes (Signed)
Physical Therapy Session Note  Patient Details  Name: Jesse [name] MRN: [id number] Date of Birth: May 23, 1961  Today's Date: 03/26/2023 PT Individual Time: 1334-1430 PT Individual Time Calculation (min): 56 min   Short Term Goals: Week 1:  PT Short Term Goal 1 (Week 1): Therapist will initiate power wheelchair consult (Wheelchair assessment scheduled for 5/16) PT Short Term Goal 1 - Progress (Week 1): Progressing toward goal PT Short Term Goal 2 (Week 1): Pt will initiate gait training PT Short Term Goal 2 - Progress (Week 1): Discontinued (comment) (Discontinued as pt now NWB R LE) PT Short Term Goal 3 (Week 1): Pt will perform sit<>stand with CGA and LRAD (Discontinued as pt now NWB R LE) PT Short Term Goal 3 - Progress (Week 1): Discontinued (comment) PT Short Term Goal 4 (Week 1): Pt will perform bed to chair transfer with CGA and LRAD PT Short Term Goal 4 - Progress (Week 1): Met Week 2:  PT Short Term Goal 1 (Week 2): STG=LTG 2/2 ELOS  Skilled Therapeutic Interventions/Progress Updates:      Pt seated in WC upon arrival. Pt agreeable to therapy. Pt denies any pain.   Pt very emotional regarding potential eviction. Pt utilized therapeutic use of self to provide support/reassurance. Therapist expressed concerns regarding pt discharging home--1 step entrance, pt ability to provide wound care, pt ability to access bathroom. Therapist reiterated options social work discussed with pt. Pt refuses to go to a SNF and feels he can perform wound care himself. Pt biggest concern is figuring out where he is going to live if he gets evicted from his current house. Notified Social Work.   Pt self propelled power wheelchair with supervision 150+ feet, verbal cues provided for obstacle negotiation and to look behind pt prior to backing up for safety.   Pt practiced positioning power wheelchair next to hospital bed to prepare for slide board transfer. Pt performed x15 with min A fading to  supervision, with mod A verbal cuing for technique.   Pt performed slide board transfer x10 WC<>bed with pt maintaining R LE NWBing precautions and pt placing board with supervision for safety with slide board placement.   Pt seated in [facility] with all needs within reach at end of session.   Therapy Documentation Precautions:  Precautions Precautions: Fall Precaution Comments: L BKA, R foot wound, wound vac Required Braces or Orthoses: Other Brace Other Brace: L LE limb protector Restrictions Weight Bearing Restrictions: Yes RLE Weight Bearing: Non weight bearing LLE Weight Bearing: Non weight bearing  Therapy/Group: Individual Therapy  [facility] Ambrose Finland, [place], DPT  03/26/2023, 4:20 PM

## 2023-03-26 NOTE — Progress Notes (Signed)
Occupational Therapy Session Note  Patient Details  Name: Jesse [name] MRN: [id number] Date of Birth: [birth year]  Today's Date: 03/26/2023 OT Individual Time: 1015-1100 OT Individual Time Calculation (min): 45 min    Short Term Goals: Week 1:  OT Short Term Goal 1 (Week 1): Pt will complete toileting tasks with (S) overall OT Short Term Goal 2 (Week 1): Pt will complete stand from the w/c with CGA OT Short Term Goal 3 (Week 1): Pt will complete toilet transfer using LRAD with (S)   Skilled Therapeutic Interventions/Progress Updates:    Pt sitting up in power wheelchair.  With Dr. Natale Lay with client changing dressings therefore missed 15 minutes of treatment.  Upon OT returning, pt reported having urgent bowel movement needs.  Completed lateral scoot transfer using sliding board with step by step Vcs for positioning of board and power w/c and needing supervision overall to complete transfer to EOB.  Supervision also needed for EOB urinal use to refrain from weightbearing through RLE (continent of urine; see flowsheet).  Supervision needed for sit to supine and pulling pants down over hips with max assist to place bedpan.  Total assist pericare at bed level completed.  Pt completed sliding board transfer to power w/c with supervision including placement and removal of board.   Pt maneuvered power w/c in tight space to sink and doffed shirt with mod I, bathed UB with min assist for back only. Donned clean shirt with setup, direct hand off to nurse tech.    Therapy Documentation Precautions:  Precautions Precautions: Fall Precaution Comments: L BKA, R foot wound, wound vac Required Braces or Orthoses: Other Brace Other Brace: L LE limb protector Restrictions Weight Bearing Restrictions: Yes RLE Weight Bearing: Non weight bearing LLE Weight Bearing: Non weight bearing   Therapy/Group: Individual Therapy  Amie Critchley 03/26/2023, 12:29 PM

## 2023-03-26 NOTE — Progress Notes (Signed)
Patient ID: Jesse [name], male   DOB: 1960/12/26, 62 y.o.   MRN: [id number]  Met with pt and spoke with Mike-brother, Lisa-sister and Tonya-niece via telephone to discuss team conference progress, home situation and discharge plan. [name] reports he can not go back to the apartment it is not wheelchair accessible and he can not get into the bathroom and land lord does not want him back. He will be evicted from here. Family would like for him to go to a SNF where he can get care and heal. Pt has yet to agree to this, but is thinking it over. Will need to await MD recommendations after the MRI that was done today. Informed all if plan is for SNF will ned to find a facility willing to take him and he will not be going on Sat 5/18. Pt is agreeable to wait to see what Dr. Lajoyce Corners has to tell him and go from there. Pt has not spoken with his family due to not answering his phone when it was ringing in hs room and he said not to answer it. [name] reports he did talk with him last night. Will continue to work on discharge plan.

## 2023-03-26 NOTE — Progress Notes (Signed)
Patient ID: Jesse [name], male   DOB: 08/30/1961, 62 y.o.   MRN: [id number] Examination the right foot patient has a necrotic plantar right heel ulcer that probes to bone.  There is odor and maceration and drainage.  Patient has a strong palpable dorsalis pedis pulse.  I will order an MRI scan.  Concern for osteomyelitis of the calcaneus.  And discussed with the patient the possibility of a transtibial amputation on the right.

## 2023-03-26 NOTE — Progress Notes (Signed)
Physical Therapy Session Note  Patient Details  Name: Jesse [name] MRN: [id number] Date of Birth: 14-Mar-1961  Today's Date: 03/26/2023 PT Individual Time: 0859-1000 PT Individual Time Calculation (min): 61 min   Short Term Goals: Week 2:  PT Short Term Goal 1 (Week 2): STG=LTG 2/2 ELOS  Skilled Therapeutic Interventions/Progress Updates:    Chart reviewed and pt agreeable to therapy. Pt received seated in PWC with no c/o pain. Session focused on safety and practice with PWC navigation to promote safe home and community access. Pt initiated session with navigation of PWC around hallways and gym spaces using supervision. Pt required periodic VC for safe turns. Pt then practiced off-loading techniques to prevent pressure sores with PT. Pt was able to both verbalize and demonstrate the techniques for the required time for each position. Pt also discussed and demonstrated understanding of PWC features to support pressure relief. Pt then practiced PWC navigation around elevators, outdoors on level and inclined concrete, and gift shops. T/o PWC navigation, pt and PT discussed and demonstrated various safety and PWC management techniques to support safe community mobility including observation of ground surface changes, observation of public movement in aisles and other tight spaces, and adaptions for safe reversing in public. Pt then returned to room in [facility] with supervision. At end of session, pt was left seated in PWC with alarm engaged, nurse call bell and all needs in reach.     Therapy Documentation Precautions:  Precautions Precautions: Fall Precaution Comments: L BKA, R foot wound, wound vac Required Braces or Orthoses: Other Brace Other Brace: L LE limb protector Restrictions Weight Bearing Restrictions: Yes RLE Weight Bearing: Non weight bearing LLE Weight Bearing: Non weight bearing     Therapy/Group: Individual Therapy  Dionne Milo, PT, DPT 03/26/2023, 10:17 AM

## 2023-03-26 NOTE — Progress Notes (Addendum)
PROGRESS NOTE   Subjective/Complaints: Pt seen by Dr. Lajoyce Corners today who ordered MRI due to concern for osteomyelitis of R heel.     Review of Systems  Constitutional:  Negative for fever and malaise/fatigue.  HENT:  Negative for congestion.   Respiratory:  Negative for shortness of breath.   Cardiovascular:  Negative for chest pain.  Gastrointestinal:  Negative for abdominal pain, constipation, diarrhea, nausea and vomiting.  Musculoskeletal:  Negative for joint pain.  Skin:  Negative for rash.  Neurological:  Positive for sensory change and weakness. Negative for headaches.     Objective:   DG Foot Complete Right  Result Date: 03/26/2023 CLINICAL DATA:  Left foot ulcer. Open sore on plantar surface of the right heel. EXAM: RIGHT FOOT COMPLETE - 3+ VIEW COMPARISON:  None Available. FINDINGS: There is no evidence of fracture or dislocation. There is no cortical erosion or periosteal reaction. Plantar calcaneal spurring. Mild arthritic changes of the interphalangeal joint of the first digit. Skin irregularity about the plantar aspect of the foot consistent with history of non healing foot ulcer. Generalized soft tissue swelling about the dorsum of the foot. IMPRESSION: 1. No radiographic evidence of osteomyelitis. 2. Skin irregularity about the plantar aspect of the foot consistent with history of non healing foot ulcer. Generalized soft tissue edema. Electronically Signed   By: Larose Hires D.O.   On: 03/26/2023 08:33   Recent Labs    03/24/23 0747  WBC 10.4  HGB 8.7*  HCT 27.9*  PLT 529*    Recent Labs    03/24/23 0747  NA 137  K 4.4  CL 106  CO2 22  GLUCOSE 142*  BUN 27*  CREATININE 1.07  CALCIUM 9.1     Intake/Output Summary (Last 24 hours) at 03/26/2023 0853 Last data filed at 03/26/2023 0730 Gross per 24 hour  Intake 594 ml  Output 1550 ml  Net -956 ml         Physical Exam: Vital Signs Blood pressure  121/72, pulse 84, temperature 97.8 F (36.6 C), resp. rate 18, height 6' (1.829 m), weight 89.1 kg, SpO2 100 %.  Physical Exam: Blood pressure 117/66, pulse 75, temperature 98.2 F (36.8 C), temperature source Oral, resp. rate 18, height 6' (1.829 m), weight 96.3 kg, SpO2 99 %. Gen: no distress, normal appearing HEENT: oral mucosa pink and moist, NCAT Cardio: Reg rate Chest: normal effort, normal rate of breathing Abd: soft, non-distended Skin: intact    Extremities: Right heel ulcer, left BKA Psych: Pt's affect is appropriate. Pt is cooperative  Musculoskeletal:     Right lower leg: Edema present.     Comments:  Wound vac to left residual limb has been removed. Small skin tear anterior incision.   Wearing limb protector on left lower extremity. Skin:    Comments: Right heel ulcers, please see image.  Wearing PR AFO Neurological:     General: No focal deficit present.     Mental Status: He is alert and oriented to person, place, and time.     Comments: Alert and oriented x 3. Normal insight and awareness. Intact Memory. Normal language and speech. Cranial nerve exam unremarkable. MMT: 5/5 BLE,  RLE grossly 4/5 prox to distal with some limitations distally. LLE limited by vac but grossly 3-4/5. Decreased LT RLE below mid calf.  Psychiatric:        Mood and Affect: Appropriate, normal affect     Assessment/Plan: 1. Functional deficits which require 3+ hours per day of interdisciplinary therapy in a comprehensive inpatient rehab setting. Physiatrist is providing close team supervision and 24 hour management of active medical problems listed below. Physiatrist and rehab team continue to assess barriers to discharge/monitor patient progress toward functional and medical goals  Care Tool:  Bathing    Body parts bathed by patient: Right arm, Left arm, Chest, Abdomen, Front perineal area, Right upper leg, Left upper leg, Right lower leg, Face, Buttocks   Body parts bathed by helper:  Buttocks Body parts n/a: Left lower leg (BKA)   Bathing assist Assist Level: Supervision/Verbal cueing     Upper Body Dressing/Undressing Upper body dressing   What is the patient wearing?: Pull over shirt    Upper body assist Assist Level: Supervision/Verbal cueing    Lower Body Dressing/Undressing Lower body dressing      What is the patient wearing?: Pants     Lower body assist Assist for lower body dressing: Minimal Assistance - Patient > 75%     Toileting Toileting    Toileting assist Assist for toileting: Moderate Assistance - Patient 50 - 74%     Transfers Chair/bed transfer  Transfers assist  Chair/bed transfer activity did not occur: N/A  Chair/bed transfer assist level: Supervision/Verbal cueing (SB)     Locomotion Ambulation   Ambulation assist   Ambulation activity did not occur: Safety/medical concerns (pt refused due to R LE pain/wounds)          Walk 10 feet activity   Assist  Walk 10 feet activity did not occur: Safety/medical concerns        Walk 50 feet activity   Assist Walk 50 feet with 2 turns activity did not occur: Safety/medical concerns         Walk 150 feet activity   Assist Walk 150 feet activity did not occur: Safety/medical concerns         Walk 10 feet on uneven surface  activity   Assist Walk 10 feet on uneven surfaces activity did not occur: Safety/medical concerns         Wheelchair     Assist Is the patient using a wheelchair?: Yes Type of Wheelchair: Manual    Wheelchair assist level: Supervision/Verbal cueing      Wheelchair 50 feet with 2 turns activity    Assist        Assist Level: Supervision/Verbal cueing   Wheelchair 150 feet activity     Assist      Assist Level: Supervision/Verbal cueing   Blood pressure 121/72, pulse 84, temperature 97.8 F (36.6 C), resp. rate 18, height 6' (1.829 m), weight 89.1 kg, SpO2 100 %.  Medical Problem List and Plan: 1.  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 Formulized individual treatment plan based on medical history, underlying problem and comorbidities.    2.  Impaired mobility: continue Lovenox             -antiplatelet therapy: Aspirin and Plavix    3. Pain: Tylenol, oxycodone as needed             -continue gabapentin 100 mg TID             -pt reports reasonable pain control at present  4. Mood/Behavior/Sleep: LCSW to evaluate and provide emotional support             -antipsychotic agents: n/a   5. Neuropsych/cognition: This patient is capable of making decisions on his own behalf.   6. Wound vac:              -Prevena vac comes off on 5/11---placed order for removal, residual limb examined and healing well             -right heel ulcer; off-load and continue daily dressing changes  -Dicussed with Dr. Lajoyce Corners 5/9, ordered ARAFO to wear at all times ok to WB in [facility], otherwise NWB  -He continues to have drainage from his right foot wound, drainage with strong odor, contact Dr. Lajoyce Corners for recommendations regarding wound management  -5/15 PT started on doxycycline for possible heel infection yesterday, today MRI ordered by Dr. Lajoyce Corners for concern of osteomyelitis    7. Fluids/Electrolytes/Nutrition: Routine Is and Os and follow-up chemistries             -continue MVI and Vit C, zinc supplementation             -continue Juven    9: Hyperlipidemia: -Hold statin for a few days, recheck Monday -5/14 statin has been restarted   10: DM-2: CBGs QID; carb modified diet             -continue Semglee 5 units at bedtime  D/c bedtime ISS given excellent control and since patient will not have this at home  -5/15 CBGS well controlled.    CBG (last 3)  Recent Labs    03/25/23 1647 03/25/23 2058 03/26/23 0603  GLUCAP 112* 147* 146*       11: Hyponatremia: trend/follow-up BMP -5/8 Na stable 133 -5/13 Na stable 137     Latest Ref Rng & Units 03/24/2023    7:47 AM 03/19/2023    4:59 AM 03/18/2023    8:50 AM  BMP  Glucose 70 - 99 mg/dL 161  096  045   BUN 6 - 20 mg/dL 27  25  25    Creatinine 0.61 - 1.24 mg/dL 4.09  8.11  9.14   Sodium 135 - 145 mmol/L 137  133  133   Potassium 3.5 - 5.1 mmol/L 4.4  4.0  4.2   Chloride 98 - 111 mmol/L 106  103  103   CO2 22 - 32 mmol/L 22  20  20    Calcium 8.9 - 10.3 mg/dL 9.1  8.5  8.4       12: Elevated BUN: follow-up BMP             -encourage fluids  -5/8 BUN stable 25, continue to encourage fluid intake  -5/13 Cr down to 1.07, BUN 27  Recheck tomorrow, continue to encourage  fluid intake   13: Leukocytosis: downward trend; follow-up CBC  -5/13 WBC down to 10.4  14: Anemia: multifactorial/acute blood loss: follow-up CBC  -5/8 HGB stable at 8.9  -5/13 HGB stable at 8.7   Recheck tomorrow   15: 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 Lajoyce Corners on 4/07 to have legs wrapped with Dynaflex   16 Right Eye irritation  -continue Lubricating drops ordered Q4h prn  17.  Elevated LFTs -Hold statin for a few days, decrease Tylenol to every 6 hours as needed,  -AST down to 33, ALT still 60  -Recheck tomorrow ordered  18: Thrombocytosis  -5/13 529 plt today, likely reactive   19. Dry skin: Eucerin ordered   LOS: 8 days A FACE TO FACE EVALUATION WAS PERFORMED  Fanny Dance 03/26/2023, 8:53 AM

## 2023-03-27 LAB — CBC
HCT: 29.1 % — ABNORMAL LOW (ref 39.0–52.0)
Hemoglobin: 9.2 g/dL — ABNORMAL LOW (ref 13.0–17.0)
MCH: 31.5 pg (ref 26.0–34.0)
MCHC: 31.6 g/dL (ref 30.0–36.0)
MCV: 99.7 fL (ref 80.0–100.0)
Platelets: 493 10*3/uL — ABNORMAL HIGH (ref 150–400)
RBC: 2.92 MIL/uL — ABNORMAL LOW (ref 4.22–5.81)
RDW: 14.2 % (ref 11.5–15.5)
WBC: 9.2 10*3/uL (ref 4.0–10.5)
nRBC: 0 % (ref 0.0–0.2)

## 2023-03-27 LAB — COMPREHENSIVE METABOLIC PANEL
ALT: 42 U/L (ref 0–44)
AST: 22 U/L (ref 15–41)
Albumin: 2.3 g/dL — ABNORMAL LOW (ref 3.5–5.0)
Alkaline Phosphatase: 136 U/L — ABNORMAL HIGH (ref 38–126)
Anion gap: 8 (ref 5–15)
BUN: 35 mg/dL — ABNORMAL HIGH (ref 6–20)
CO2: 23 mmol/L (ref 22–32)
Calcium: 9.2 mg/dL (ref 8.9–10.3)
Chloride: 105 mmol/L (ref 98–111)
Creatinine, Ser: 0.99 mg/dL (ref 0.61–1.24)
GFR, Estimated: >60 mL/min (ref >60)
Glucose, Bld: 120 mg/dL — ABNORMAL HIGH (ref 70–99)
Potassium: 4.4 mmol/L (ref 3.5–5.1)
Sodium: 136 mmol/L (ref 135–145)
Total Bilirubin: 0.3 mg/dL (ref 0.3–1.2)
Total Protein: 8.1 g/dL (ref 6.5–8.1)

## 2023-03-27 LAB — GLUCOSE, CAPILLARY
Glucose-Capillary: 117 mg/dL — ABNORMAL HIGH (ref 70–99)
Glucose-Capillary: 111 mg/dL — ABNORMAL HIGH (ref 70–99)
Glucose-Capillary: 146 mg/dL — ABNORMAL HIGH (ref 70–99)
Glucose-Capillary: 160 mg/dL — ABNORMAL HIGH (ref 70–99)
Glucose-Capillary: 168 mg/dL — ABNORMAL HIGH (ref 70–99)

## 2023-03-27 NOTE — Progress Notes (Signed)
Physical Therapy Session Note  Patient Details  Name: Jesse [name] MRN: [id number] Date of Birth: May 07, 1961  Today's Date: 03/27/2023 PT Individual Time: 9518-8416 PT Individual Time Calculation (min): 69 min   Short Term Goals: Week 1:  PT Short Term Goal 1 (Week 1): Therapist will initiate power wheelchair consult (Wheelchair assessment scheduled for 5/16) PT Short Term Goal 1 - Progress (Week 1): Progressing toward goal PT Short Term Goal 2 (Week 1): Pt will initiate gait training PT Short Term Goal 2 - Progress (Week 1): Discontinued (comment) (Discontinued as pt now NWB R LE) PT Short Term Goal 3 (Week 1): Pt will perform sit<>stand with CGA and LRAD (Discontinued as pt now NWB R LE) PT Short Term Goal 3 - Progress (Week 1): Discontinued (comment) PT Short Term Goal 4 (Week 1): Pt will perform bed to chair transfer with CGA and LRAD PT Short Term Goal 4 - Progress (Week 1): Met Week 2:  PT Short Term Goal 1 (Week 2): STG=LTG 2/2 ELOS  Skilled Therapeutic Interventions/Progress Updates:      Pt seated in WC upon arrival. Pt agreeable to therapy. Pt denies any pain.  Wheelchair evaluation cancelled 2/2 secondary to scheduling conflict. Will reschedule for next week in the instance pt opts to go home versus SNF.    Pt still seems unsure if he wants to discharge to his home, or to a SNF. Treatment session focused on community accessibility via power wheelchair in the instance patient decides to discharge home, as pt will have to navigate power wheelchair a mile down road to go to grocery store, pt will be on sidewalk for a block but the remainder of distance is on the street.   Pt propelled power wheelchair from room to outside. Pt navigated multiple obstacles including elevator, cars, pedestrians, etc.with supervision. Pt practiced navigating power wheelchair at various speeds. Pt practiced stopping power wheelchair suddenly. Pt performed 10 laps around women's entrance, with verbal  cues provided to travel on R side of road as close to sidewalk as possible, look both ways prior to crossing road, utilize higher speed setting when crossing street for safety. Pt able to recall safety cues by last few laps.   Pt practiced steering in apartment set up at low speed setting with supervison.  Discussed pt home set up. Pt house has one step to enter, pt is NWBing B LE, and pt reports his landlord likely won't allow him to install a ramp. Pt bathroom is not accessible via wheelchair. Pt reports landlord likely won't allow pt to widen door frame. Discussed proximity of toilet to doorframe. Pt not within pt arms reach. Therapist discussed concerns of how pt would empty BSC bucket into toilet safely with B NWB precautions, and bathroom inaccessible to wheelchair. Encouraged pt to consider bathroom accessibility, one step into entrance, and wound care when making decision to discharge home, or to SNF. Pt verbalized understanding of therapist concerns.   Pt seated in WC at end of session with all needs within reach. Pt refusing alarm but aware not to get up by himself.     Therapy Documentation Precautions:  Precautions Precautions: Fall Precaution Comments: L BKA, R foot wound, wound vac Required Braces or Orthoses: Other Brace Other Brace: L LE limb protector Restrictions Weight Bearing Restrictions: Yes RLE Weight Bearing: Non weight bearing LLE Weight Bearing: Non weight bearing  Therapy/Group: Individual Therapy  [facility] Ambrose Finland, [place], DPT  03/27/2023, 7:46 AM

## 2023-03-27 NOTE — Progress Notes (Signed)
Patient ID: Jesse [name], male   DOB: Sep 25, 1961, 62 y.o.   MRN: [id number] Patient is seen for right heel ulceration.  MRI scan does not show definite osteomyelitis.  Clinically this extends down to bone.  Will plan for debridement in the operating room for the right heel ulcer tomorrow.  Patient will be able to continue his current therapy and rehab without change in his [facility] boot.  Patient will have a Praveena plus portable wound VAC pump for discharge to home.

## 2023-03-27 NOTE — Progress Notes (Signed)
Physical Therapy Session Note  Patient Details  Name: SHNEUR ULRICH MRN: [id number] Date of Birth: 07/03/1961  Today's Date: 03/27/2023 PT Individual Time: 1035-1130 PT Individual Time Calculation (min): 55 min   Short Term Goals: Week 2:  PT Short Term Goal 1 (Week 2): STG=LTG 2/2 ELOS  Skilled Therapeutic Interventions/Progress Updates:     Pt received seated in power WC and agrees to therapy. Reports 6/10 pain in R foot. PT provides repositioning and rest breaks as needed to manage pain. Pt maneuvers power WC with cues from PT for safety, ensuring that pt decreases speed setting when navigating obstacles in crowded environment. PT has extended discussion with pt regarding recommendation for discharge to SNF due to pt's mobility impairments and care requirements. Pt verbalizes understanding but also feels that he would be "ok" discharging home and feels he is able to take care of himself, despite PT education on amount of assistance pt requires for mobility and self care tasks. Pt performs upper extremity and core exercises while seated in power WC to strengthen muscles to assist with functional mobility. Pt completes 3x20 biceps curls with 3lb bar, shoulder presses, and 3x10 trunk rotations holding onto bar vertically to promote weight shifting and dynamic sitting balance. Pt then completes 3x10 triceps press-ups with cue to lift buttocks as high as possible off chair. Pt maneuvers power WC >500', including up and down ramp, requiring verbal cues for safe sequencing and management. Following, pt left seated with all needs within reach.   Therapy Documentation Precautions:  Precautions Precautions: Fall Precaution Comments: L BKA, R foot wound, wound vac Required Braces or Orthoses: Other Brace Other Brace: L LE limb protector Restrictions Weight Bearing Restrictions: Yes RLE Weight Bearing: Non weight bearing LLE Weight Bearing: Non weight bearing    Therapy/Group: Individual  Therapy  Beau Fanny, PT, DPT 03/27/2023, 12:18 PM

## 2023-03-27 NOTE — Progress Notes (Signed)
Consent obtained for right heel debridement. Placed in chart.  Mylo Red, LPN

## 2023-03-27 NOTE — [place] FL2 (Signed)
[place] MEDICAID FL2 LEVEL OF CARE FORM     IDENTIFICATION  Patient Name: Jesse [name] Birthdate: 09-Aug-1961 Sex: male Admission Date (Current Location): 03/18/2023  [place] and [place] Number:  Haynes Bast [id number] [employer] Facility and Address:  [facility], [address] [place], [place] [address]      Provider Number: [id number]  Attending Physician Name and Address:  Fanny Dance, MD  Relative Name and Phone Number:  Mike-brother [phone]    Current Level of Care: Other (Comment) (Rehab) Recommended Level of Care: Skilled Nursing Facility Prior Approval Number:    Date Approved/Denied:   PASRR Number: [id number]  Discharge Plan: SNF    Current Diagnoses: Patient Active Problem List   Diagnosis Date Noted   Coping style affecting medical condition 03/21/2023   Osteomyelitis of left lower extremity (HCC) 03/18/2023   Gangrene of left foot (HCC) 03/12/2023   Renal insufficiency 03/12/2023   PVD (peripheral vascular disease) (HCC) 03/12/2023   Hyponatremia 03/12/2023   Normocytic anemia 03/12/2023   Lymphedema 03/04/2023   Eye injury 11/21/2022   Hyperkalemia 07/10/2022   Chronic painful diabetic neuropathy (HCC) 07/10/2022   Metabolic acidosis, normal anion gap (NAG) 07/10/2022   Hypomagnesemia 07/10/2022   Charcot's arthropathy 06/28/2022   Leukocytosis    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)    Severe protein-calorie malnutrition (HCC)    Diabetic foot infection (HCC) 06/22/2022   Sepsis (HCC) 06/22/2022   Cellulitis in diabetic foot (HCC) 04/17/2021   Osteomyelitis of left foot (HCC) 04/14/2021   HTN (hypertension) 04/08/2021   HLD (hyperlipidemia) 04/08/2021   Alcohol abuse    Diabetic ulcer of right foot (HCC)    Diabetes mellitus type II, controlled (HCC) 01/27/2014   AKI (acute kidney injury) (HCC) 01/27/2014    Orientation  RESPIRATION BLADDER Height & Weight     Self, Time, Situation, Place  Normal Continent Weight: 196 lb 6.9 oz (89.1 kg) Height:  6' (182.9 cm)  BEHAVIORAL SYMPTOMS/MOOD NEUROLOGICAL BOWEL NUTRITION STATUS      Continent Diet (Carb Modified thin liquids)  AMBULATORY STATUS COMMUNICATION OF NEEDS Skin   Total Care (wheelchair level) Verbally Surgical wounds, Wound Vac (7 days wound vac-Prevana)                       Personal Care Assistance Level of Assistance  Bathing, Dressing Bathing Assistance: Limited assistance Feeding assistance: Independent Dressing Assistance: Limited assistance     Functional Limitations Info             SPECIAL CARE FACTORS FREQUENCY  PT (By licensed PT), OT (By licensed OT)     PT Frequency: 5x week OT Frequency: 3-5 x week            Contractures Contractures Info: Not present    Additional Factors Info  Code Status, Allergies Code Status Info: Full Code Allergies Info: NKDA           Current Medications (03/27/2023):  This is the current hospital active medication list Current Facility-Administered Medications  Medication Dose Route Frequency Provider Last Rate Last Admin   acetaminophen (TYLENOL) tablet 325-650 mg  325-650 mg Oral Q6H PRN Fanny Dance, MD       alum & mag hydroxide-simeth (MAALOX/MYLANTA) 200-200-20 MG/5ML suspension 30 mL  30 mL Oral Q4H PRN Setzer, Lynnell Jude, PA-C       artificial tears (LACRILUBE) ophthalmic ointment   Right Eye  Q4H PRN Fanny Dance, MD       ascorbic acid (VITAMIN C) tablet 1,000 mg  1,000 mg Oral Daily Milinda Antis, PA-C   1,000 mg at 03/27/23 1610   aspirin EC tablet 81 mg  81 mg Oral Daily Milinda Antis, PA-C   81 mg at 03/27/23 0841   atorvastatin (LIPITOR) tablet 40 mg  40 mg Oral QHS Fanny Dance, MD   40 mg at 03/26/23 2119   clopidogrel (PLAVIX) tablet 75 mg  75 mg Oral Daily Milinda Antis, PA-C   75 mg at 03/27/23 0841   docusate sodium (COLACE) capsule 100  mg  100 mg Oral Daily Milinda Antis, PA-C   100 mg at 03/27/23 0841   doxycycline (VIBRA-TABS) tablet 100 mg  100 mg Oral Q12H Jacquelynn Cree, PA-C   100 mg at 03/27/23 0841   enoxaparin (LOVENOX) injection 40 mg  40 mg Subcutaneous Q24H Milinda Antis, PA-C   40 mg at 03/27/23 9604   gabapentin (NEURONTIN) capsule 100 mg  100 mg Oral TID Milinda Antis, PA-C   100 mg at 03/27/23 5409   Gerhardt's butt cream   Topical BID Milinda Antis, PA-C   Given at 03/26/23 2120   guaiFENesin-dextromethorphan (ROBITUSSIN DM) 100-10 MG/5ML syrup 10 mL  10 mL Oral Q6H PRN Milinda Antis, PA-C       hydrocerin (EUCERIN) cream   Topical BID Horton Chin, MD   Given at 03/27/23 0841   insulin aspart (novoLOG) injection 0-9 Units  0-9 Units Subcutaneous TID WC Milinda Antis, PA-C   2 Units at 03/26/23 1725   insulin glargine-yfgn (SEMGLEE) injection 5 Units  5 Units Subcutaneous QHS Milinda Antis, PA-C   5 Units at 03/26/23 2121   methocarbamol (ROBAXIN) tablet 500 mg  500 mg Oral Q6H PRN Milinda Antis, PA-C   500 mg at 03/20/23 8119   multivitamin with minerals tablet 1 tablet  1 tablet Oral Daily Milinda Antis, PA-C   1 tablet at 03/27/23 1478   nutrition supplement (JUVEN) (JUVEN) powder packet 1 packet  1 packet Oral BID BM Milinda Antis, PA-C   1 packet at 03/27/23 1015   ondansetron (ZOFRAN) injection 4 mg  4 mg Intravenous Q6H PRN Milinda Antis, PA-C       oxyCODONE (Oxy IR/ROXICODONE) immediate release tablet 10-15 mg  10-15 mg Oral Q4H PRN Milinda Antis, PA-C   10 mg at 03/22/23 1945   oxyCODONE (Oxy IR/ROXICODONE) immediate release tablet 5-10 mg  5-10 mg Oral Q4H PRN Milinda Antis, PA-C   10 mg at 03/25/23 2956   pantoprazole (PROTONIX) EC tablet 40 mg  40 mg Oral Daily Milinda Antis, PA-C   40 mg at 03/27/23 0841   polyethylene glycol (MIRALAX / GLYCOLAX) packet 17 g  17 g Oral Daily PRN Milinda Antis, PA-C       protein supplement (ENSURE MAX) liquid  11 oz  Oral Q0600 Jacquelynn Cree, PA-C   11 oz at 03/26/23 2121   sodium phosphate (FLEET) 7-19 GM/118ML enema 1 enema  1 enema Rectal Once PRN Milinda Antis, PA-C       sorbitol 70 % solution 30 mL  30 mL Oral Daily PRN Milinda Antis, PA-C       traZODone (DESYREL) tablet 50 mg  50 mg Oral QHS PRN Milinda Antis, PA-C       zinc sulfate  capsule 220 mg  220 mg Oral Daily Milinda Antis, PA-C   220 mg at 03/27/23 1610     Discharge Medications: Please see discharge summary for a list of discharge medications.  Relevant Imaging Results:  Relevant Lab Results:   Additional Information SSN: [id number]  Braelyn Jenson, Lemar Livings, LCSW

## 2023-03-27 NOTE — Progress Notes (Signed)
Occupational Therapy Session Note  Patient Details  Name: FLEMING MATEL MRN: [id number] Date of Birth: Nov 22, 1960  Today's Date: 03/27/2023 OT Individual Time: 4098-1191 OT Individual Time Calculation (min): 55 min    Short Term Goals: Week 1:  OT Short Term Goal 1 (Week 1): Pt will complete toileting tasks with (S) overall OT Short Term Goal 2 (Week 1): Pt will complete stand from the w/c with CGA OT Short Term Goal 3 (Week 1): Pt will complete toilet transfer using LRAD with (S)  Skilled Therapeutic Interventions/Progress Updates:    Patient agreeable to participate in OT session. Reports 0/10 pain level.   Patient participated in skilled OT session focusing on ADL re-training and functional transfers while working on new WB restrictions to RLE. UB bathing and dressing completed seated in recliner at sink level with Set-up. LB bathing completed seated performing lateral leans to wash buttocks in order to maintain WB status.   Assist provided to donn PRAFO boot on RLE and don/doff limb protector. Pt able to don scrub shorts without physical assist while lateral leans. Once finished with bathing/dressing, pt completed lateral scoot transfer towards left from drop arm recliner to powered w/c. Therapist provided set-up for transfer and stabilized recliner for safety. VC provided to maintain WB restrictions on right LE.  Therapy Documentation Precautions:  Precautions Precautions: Fall Precaution Comments: L BKA, R foot wound, wound vac Required Braces or Orthoses: Other Brace Other Brace: L LE limb protector Restrictions Weight Bearing Restrictions: Yes RLE Weight Bearing: Non weight bearing LLE Weight Bearing: Non weight bearing  Therapy/Group: Individual Therapy  Limmie Patricia, OTR/L,CBIS  Supplemental OT - [facility] and [facility] Secure Chat Preferred   03/27/2023, 7:00 AM

## 2023-03-27 NOTE — H&P (View-Only) (Signed)
Patient ID: Jesse [name], male   DOB: 06/25/1961, 62 y.o.   MRN: [id number] Patient is seen for right heel ulceration.  MRI scan does not show definite osteomyelitis.  Clinically this extends down to bone.  Will plan for debridement in the operating room for the right heel ulcer tomorrow.  Patient will be able to continue his current therapy and rehab without change in his PRAFO boot.  Patient will have a Praveena plus portable wound VAC pump for discharge to home. 

## 2023-03-27 NOTE — Patient Care Conference (Signed)
Inpatient RehabilitationTeam Conference and Plan of Care Update Date: 03/26/2023   Time: 12:01 PM    Patient Name: Jesse [name]      Medical Record Number: [id number]  Date of Birth: 1961-05-13 Sex: Male         Room/Bed: 4W10C/4W10C-01 Payor Info: Payor: [place] MEDICAID PREPAID HEALTH PLAN / Plan: [place] MEDICAID [employer] [place] / Product Type: *No Product type* /    Admit Date/Time:  03/18/2023  3:51 PM  Primary Diagnosis:  Osteomyelitis of left lower extremity [facility])  Hospital Problems: Principal Problem:   Osteomyelitis of left lower extremity (HCC) Active Problems:   Coping style affecting medical condition    Expected Discharge Date: Expected Discharge Date:  (D/C pending)  Team Members Present: Physician leading conference: Dr. Fanny Dance Social Worker Present: Dossie Der, LCSW Nurse Present: Chana Bode, RN PT Present: Ambrose Finland, PT OT Present: Roney Mans, OT SLP Present: Other (comment) Fae Pippin, SLP) PPS Coordinator present : Fae Pippin, SLP     Current Status/Progress Goal Weekly Team Focus  Bowel/Bladder   Pt continent B/B  LBM  03/24/23   Pt will maintain normal B/B pattern   Assist pt with toileting needs qshift/prn    Swallow/Nutrition/ Hydration               ADL's   Pt currently unable to care for his wounds on either LE; supervision for UB ADLs, can transfers via slide board wtih min guard to supervision. Pt is now non weight bearing through his right LE due to wound   mod I   will have a w/c eval this week on Thursday, focus on problem solving being alone at home and how to manage things (ie like emptying out [facility]) and practicing ADLs from power chair level .    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 shrinker and limbguard in place   Pt will be free from infection  Promote healing and prevent further breakdown      Discharge Planning:  Pt and family have differing opinions pt wants to return home and family wants him to go to a facility. Dr Lajoyce Corners did see this am and plans on MRI of other leg, may need more surgery. Landlord has not evicted him but will start if comes home according to him. Family very concerned but only can check on him. Continue to work on plan for pt. He feels if had power chair would do better than was doing prior to admission   Team Discussion: Patient post left BKA with MASD on buttocks and ulcer on right foot/heel on antibiotics for right heel wound.  Patient on target to meet rehab goals: Currently patient is non-weight bearing on right foot with PRAFO.  Able to complete slide board transfers with close supervision  and lateral leans to manage clothes.  *See Care Plan and progress notes for long and short-term goals.   Revisions to Treatment Plan:  Orthopedic consult for wound care MRI of right foot r/o osteomyelitis and or abscess Consult to dietician/diabetes coordinator Wheelchair assessment consult   Teaching Needs: Safety, skin care, wound care, medications, insulin administration,dietary modification, transfers, toileting, etc.  Current Barriers to Discharge: Home enviroment access/layout, Wound care, and Lack of/limited family support  Possible Resolutions to Barriers: Ramp recommended for entry to home; SW following up with landlord and family SNF option reviewed; patient declined DME: wide-drop-arm BSC,  hospital bed, slide board and power wheelchair     Medical Summary Current Status: L BKA, R foot wound, DM2, elevated BUN  Barriers to Discharge: Complicated Wound;Medical stability;Weight bearing restrictions  Barriers to Discharge Comments: L BKA, R foot wound, DM2, elevated BUN Possible Resolutions to [employer] Focus: MRI for R foot wound, PFAFO and nonweightbrearing RLE, monitor LFTs- recheck tomorrow   Continued Need for Acute Rehabilitation Level of Care: The patient requires daily medical management by a physician with specialized training in physical medicine and rehabilitation for the following reasons: Direction of a multidisciplinary physical rehabilitation program to maximize functional independence : Yes Medical management of patient stability for increased activity during participation in an intensive rehabilitation regime.: Yes Analysis of laboratory values and/or radiology reports with any subsequent need for medication adjustment and/or medical intervention. : Yes   I attest that I was present, lead the team conference, and concur with the assessment and plan of the team.   Chana Bode B 03/27/2023, 10:20 AM

## 2023-03-27 NOTE — Progress Notes (Addendum)
Patient ID: Jesse [name], male   DOB: 29-Oct-1961, 62 y.o.   MRN: [id number]  Met with pt to discuss once again the plan at discharge. He is on the fence with agreeing to going to a SNF. He has not officially been evicted it takes 30-60 days for this to occur but his land lord does not want him to come back there. This worker plans to send Fl2 out to see if can get any offers. Pt does not want to go to a NH but to try to see how he can do in his apartment. To have I & D on leg tomorrow per Dr. Lajoyce Corners. Dr. Benjie Karvonen reports will be a few extra days so not discharging Sat 5/18. Pt will let worker know by the ned of the day what he wants to do.  3:38 PM Spoke with pt when on the phone with his brother he has agreed to let this worker look for a place of him but then still may refuse if he does not want to go to the place found. Will sent out FL2. Have also sent referral to [facility] to assist with SSD/SSI application

## 2023-03-27 NOTE — Progress Notes (Addendum)
Nutrition Follow-up  DOCUMENTATION CODES:   Not applicable  INTERVENTION:  - Continue to Monitor PO intakes.   NUTRITION DIAGNOSIS:   Increased nutrient needs related to wound healing as evidenced by estimated needs.  GOAL:   Patient will meet greater than or equal to 90% of their needs  MONITOR:   PO intake  REASON FOR ASSESSMENT:   Consult Wound healing, Diet education  ASSESSMENT:   62 y.o. male admits to CIR related to functional deficits in setting of osteomyelitis of left lower extremity. PMH includes: AKI, alcohol abuse, T2DM, HTN.  Meds reviewed: Vit C, lipitor, colace, sliding scale insulin, MVI, semglee (5 units), zinc sulfate. Labs reviewed: BUN elevated. FS BG 111-179 mg/dL.   Consult for wound healing and DM diet education. Pt was seen yesterday by RD.  The pt reports that he has been eating well. Per record, pt has eaten 75-100% of his meals. Blood sugars are fairly controlled. RD was able to briefly review CHO controlled diet. RD discussed the Plate Method with the pt. Pt verbalized understanding. RD uncertain of actual understanding. RD will continue to monitor POC.   NUTRITION - FOCUSED PHYSICAL EXAM:  Flowsheet Row Most Recent Value  Orbital Region No depletion  Upper Arm Region No depletion  Thoracic and Lumbar Region No depletion  Buccal Region No depletion  Temple Region Moderate depletion  Clavicle Bone Region No depletion  Clavicle and Acromion Bone Region No depletion  Scapular Bone Region No depletion  Dorsal Hand No depletion  Patellar Region No depletion  Anterior Thigh Region No depletion  Posterior Calf Region No depletion  Edema (RD Assessment) None  Hair Reviewed  Eyes Reviewed  Mouth Reviewed  Skin Reviewed  Nails Reviewed       Diet Order:   Diet Order             Diet Carb Modified Fluid consistency: Thin; Room service appropriate? Yes  Diet effective now                   EDUCATION NEEDS:   Not appropriate  for education at this time  Skin:  Skin Integrity Issues:: Diabetic Ulcer Diabetic Ulcer: right heel  Last BM:  5/15 - type 6  Height:   Ht Readings from Last 1 Encounters:  03/18/23 6' (1.829 m)    Weight:   Wt Readings from Last 1 Encounters:  03/18/23 89.1 kg    Ideal Body Weight:     BMI:  Body mass index is 26.64 kg/m.  Estimated Nutritional Needs:   Kcal:  2225-2675 kcals  Protein:  110-145 gm  Fluid:  >/= 2.2 L  Bethann Humble, RD, LDN, CNSC.

## 2023-03-27 NOTE — Progress Notes (Signed)
PROGRESS NOTE   Subjective/Complaints: Patient was seen by Dr. Lajoyce Corners this morning.  MRI without definitive osteomyelitis but plan for debridement with wound VAC placement tomorrow.  Patient with no additional concerns this morning.  He reports pain is under control.    Review of Systems  Constitutional:  Negative for fever and malaise/fatigue.  HENT:  Negative for congestion.   Respiratory:  Negative for shortness of breath.   Cardiovascular:  Negative for chest pain.  Gastrointestinal:  Negative for abdominal pain, constipation, nausea and vomiting.  Genitourinary:  Negative for dysuria.  Musculoskeletal:  Negative for joint pain.  Skin:  Negative for rash.  Neurological:  Positive for sensory change and weakness. Negative for speech change.     Objective:   MR HEEL RIGHT WO CONTRAST  Result Date: 03/26/2023 CLINICAL DATA:  Osteomyelitis suspected, ankle, xray done EXAM: MR OF THE RIGHT HEEL WITHOUT CONTRAST TECHNIQUE: Multiplanar, multisequence MR imaging of the right heel was performed. No intravenous contrast was administered. COMPARISON:  Radiograph 03/26/2023, MRI 03/03/2023 FINDINGS: Bones/Joint/Cartilage There is minimal marrow edema signal within the plantar posterior calcaneus, similar to prior exam (sagittal stir images 15-19). Preserved T1 marrow signal. The cortex is intact. There is no evidence of acute fracture. Mild degenerative change in the midfoot with dorsal spurring. Plantar and dorsal calcaneal spurring. Ligaments Intact medial and lateral ankle ligaments with chronic deep deltoid sprain. Intact high ankle ligaments. Muscles and Tendons Ruptured plantar fascia in the plantar midfoot, unchanged from prior exam. Thickening of the proximal plantar fascia near the calcaneal insertion. Soft tissues There is a plantar heel soft tissue wound which extends to within 6 mm of the proximal plantar fascia. There is extensive  adjacent edema signal without well-defined/organized collection. Additional plantar ulcer more distally and another along the lateral midfoot with underlying soft tissue swelling but no organized fluid collection. There is focal susceptibility artifact in the medial midfoot corresponding to a stable seen on recent radiograph. IMPRESSION: Plantar soft tissue ulcers, largest along the plantar heel, extending to within 6 mm of the underlying proximal plantar fascia. Minimal subcortical marrow edema in the posterior plantar calcaneus, similar to prior exam, favored to reflect reactive marrow change. No definitive osteomyelitis. No evidence of an organized soft tissue abscess. Electronically Signed   By: Caprice Renshaw M.D.   On: 03/26/2023 13:32   DG Foot Complete Right  Result Date: 03/26/2023 CLINICAL DATA:  Left foot ulcer. Open sore on plantar surface of the right heel. EXAM: RIGHT FOOT COMPLETE - 3+ VIEW COMPARISON:  None Available. FINDINGS: There is no evidence of fracture or dislocation. There is no cortical erosion or periosteal reaction. Plantar calcaneal spurring. Mild arthritic changes of the interphalangeal joint of the first digit. Skin irregularity about the plantar aspect of the foot consistent with history of non healing foot ulcer. Generalized soft tissue swelling about the dorsum of the foot. IMPRESSION: 1. No radiographic evidence of osteomyelitis. 2. Skin irregularity about the plantar aspect of the foot consistent with history of non healing foot ulcer. Generalized soft tissue edema. Electronically Signed   By: Larose Hires D.O.   On: 03/26/2023 08:33   Recent Labs    03/27/23  0658  WBC 9.2  HGB 9.2*  HCT 29.1*  PLT 493*    Recent Labs    03/27/23 0658  NA 136  K 4.4  CL 105  CO2 23  GLUCOSE 120*  BUN 35*  CREATININE 0.99  CALCIUM 9.2     Intake/Output Summary (Last 24 hours) at 03/27/2023 1313 Last data filed at 03/27/2023 0916 Gross per 24 hour  Intake 596 ml  Output  1825 ml  Net -1229 ml         Physical Exam: Vital Signs Blood pressure 124/71, pulse 87, temperature 97.9 F (36.6 C), resp. rate 18, height 6' (1.829 m), weight 89.1 kg, SpO2 100 %.  Physical Exam: Blood pressure 117/66, pulse 75, temperature 98.2 F (36.8 C), temperature source Oral, resp. rate 18, height 6' (1.829 m), weight 96.3 kg, SpO2 99 %. Gen: no distress, normal appearing, sitting in wheelchair HEENT: oral mucosa pink and moist, NCAT Cardio: RRR Chest: normal effort, normal rate of breathing Abd: soft, non-distended, positive bowel sounds Extremities: Right heel ulcer, left BKA Psych: Pt's affect is appropriate. Pt is cooperative  Musculoskeletal:     Right lower leg: Edema present.     Comments:  Wound vac to left residual limb has been removed.  Wearing limb protector on left lower extremity. Skin:    Comments: Right heel ulcer-with dressing intact wearing PR AFO Neurological:     General: No focal deficit present.     Mental Status: He is alert and oriented to person, place, and time.     Comments: Alert and oriented x 3. Normal insight and awareness. Intact Memory. Normal language and speech. Cranial nerve exam unremarkable. MMT: 5/5 BLE, RLE grossly 4/5 prox to distal with some limitations distally. LLE limited by vac but grossly 3-4/5. Decreased LT RLE below mid calf.  Psychiatric:        Mood and Affect: Appropriate, normal affect        Assessment/Plan: 1. Functional deficits which require 3+ hours per day of interdisciplinary therapy in a comprehensive inpatient rehab setting. Physiatrist is providing close team supervision and 24 hour management of active medical problems listed below. Physiatrist and rehab team continue to assess barriers to discharge/monitor patient progress toward functional and medical goals  Care Tool:  Bathing    Body parts bathed by patient: Right arm, Left arm, Chest, Abdomen, Front perineal area, Right upper leg, Left  upper leg, Right lower leg, Face, Buttocks   Body parts bathed by helper: Buttocks Body parts n/a: Left lower leg (BKA)   Bathing assist Assist Level: Supervision/Verbal cueing     Upper Body Dressing/Undressing Upper body dressing   What is the patient wearing?: Pull over shirt    Upper body assist Assist Level: Supervision/Verbal cueing    Lower Body Dressing/Undressing Lower body dressing      What is the patient wearing?: Pants     Lower body assist Assist for lower body dressing: Minimal Assistance - Patient > 75%     Toileting Toileting    Toileting assist Assist for toileting: Moderate Assistance - Patient 50 - 74%     Transfers Chair/bed transfer  Transfers assist  Chair/bed transfer activity did not occur: N/A  Chair/bed transfer assist level: Supervision/Verbal cueing (SB)     Locomotion Ambulation   Ambulation assist   Ambulation activity did not occur: Safety/medical concerns (pt refused due to R LE pain/wounds)          Walk 10 feet activity  Assist  Walk 10 feet activity did not occur: Safety/medical concerns        Walk 50 feet activity   Assist Walk 50 feet with 2 turns activity did not occur: Safety/medical concerns         Walk 150 feet activity   Assist Walk 150 feet activity did not occur: Safety/medical concerns         Walk 10 feet on uneven surface  activity   Assist Walk 10 feet on uneven surfaces activity did not occur: Safety/medical concerns         Wheelchair     Assist Is the patient using a wheelchair?: Yes Type of Wheelchair: Manual    Wheelchair assist level: Supervision/Verbal cueing      Wheelchair 50 feet with 2 turns activity    Assist        Assist Level: Supervision/Verbal cueing   Wheelchair 150 feet activity     Assist      Assist Level: Supervision/Verbal cueing   Blood pressure 124/71, pulse 87, temperature 97.9 F (36.6 C), resp. rate 18, height 6'  (1.829 m), weight 89.1 kg, SpO2 100 %.  Medical Problem List and Plan: 1. 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  However discussed with patient I think he would have very difficult time caring for himself at his apartment, unlikely to be able to care for his wounds effectively.  Discussed concerns that if wounds are not taking care of this could increase the risk of additional surgeries and amputations.  Discussed looking into SNF, patient says he will consider and let us know later today.  Will likely need to delay his discharge until early next week due to upcoming surgery.  2.  Impaired mobility: continue Lovenox             -antiplatelet therapy: Aspirin and Plavix    3. Pain: Tylenol, oxycodone as needed             -continue gabapentin 100 mg TID             -pt reports reasonable pain control at present  4. Mood/Behavior/Sleep: LCSW to evaluate and provide emotional support             -antipsychotic agents: n/a   5. Neuropsych/cognition: This patient is capable of making decisions on his own behalf.   6. Wound vac:              -Prevena vac comes off on 5/11---placed order for removal, residual limb examined and healing well             -right heel ulcer; off-load and continue daily dressing changes  -Dicussed with Dr. Lajoyce Corners 5/9, ordered ARAFO to wear at all times ok to WB in [facility], otherwise NWB  -He continues to have drainage from his right foot wound, drainage with strong odor, last Dr. Lajoyce Corners for  recommendations regarding wound management  -Debridement scheduled with Dr. Lajoyce Corners tomorrow for right heel     7. Fluids/Electrolytes/Nutrition: Routine Is and Os and follow-up chemistries             -continue MVI and Vit C, zinc supplementation             -continue Juven   9: Hyperlipidemia: -Hold statin for a few days, recheck Monday -5/14 statin has been restarted   10: DM-2: CBGs QID; carb modified diet             -continue Semglee 5 units at bedtime  D/c bedtime ISS given excellent control and since patient will not have this at home  -5/15 well-controlled overall   CBG (last 3)  Recent Labs    03/26/23 2101 03/27/23 0602 03/27/23 1135  GLUCAP 179* 117* 111*       11: Hyponatremia: trend/follow-up BMP -5/8 Na stable 133 -5/1 sodium stable at 136 6     Latest Ref Rng & Units 03/27/2023    6:58 AM 03/24/2023    7:47 AM 03/19/2023    4:59 AM  BMP  Glucose 70 - 99 mg/dL 086  578  469   BUN 6 - 20 mg/dL 35  27  25   Creatinine 0.61 - 1.24 mg/dL 6.29  5.28  4.13   Sodium 135 - 145 mmol/L 136  137  133   Potassium 3.5 - 5.1 mmol/L 4.4  4.4  4.0   Chloride 98 - 111 mmol/L 105  106  103   CO2 22 - 32 mmol/L 23  22  20    Calcium 8.9 - 10.3 mg/dL 9.2  9.1  8.5       12: Elevated BUN: follow-up BMP             -encourage fluids  -5/8 BUN stable 25, continue to encourage fluid intake  -5/13 Cr down to 1.07  13: Leukocytosis: downward trend; follow-up CBC  -5/16 WBC stable at 9.2  14: Anemia: multifactorial/acute blood loss: follow-up CBC  -5/8 HGB stable at 8.9  -5/16 Hgb stable at 9.2   15: 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 Lajoyce Corners on 4/07 to have legs wrapped with Dynaflex   16 Right Eye irritation  -continue Lubricating drops ordered Q4h prn  17.  Elevated LFTs -Hold statin for a few days, decrease Tylenol to every 6 hours as needed,  -AST down to 33, ALT still 60  -5/16 AST and ALT improved  today  18: Thrombocytosis  -5/13 529 plt today, likely reactive   19. Dry skin: Eucerin ordered   LOS: 9 days A FACE TO FACE EVALUATION WAS PERFORMED  Fanny Dance 03/27/2023, 1:13 PM

## 2023-03-28 ENCOUNTER — Other Ambulatory Visit: Payer: Self-pay

## 2023-03-28 ENCOUNTER — Inpatient Hospital Stay (HOSPITAL_COMMUNITY): Payer: Medicaid Other | Admitting: Certified Registered"

## 2023-03-28 ENCOUNTER — Encounter (HOSPITAL_COMMUNITY): Payer: Self-pay | Admitting: Physical Medicine & Rehabilitation

## 2023-03-28 ENCOUNTER — Encounter (HOSPITAL_COMMUNITY)
Admission: RE | Disposition: A | Discharge status: Skilled Nursing Facility | Payer: Self-pay | Source: Intra-hospital | Attending: Physical Medicine & Rehabilitation

## 2023-03-28 DIAGNOSIS — Z794 Long term (current) use of insulin: Secondary | ICD-10-CM

## 2023-03-28 DIAGNOSIS — I10 Essential (primary) hypertension: Secondary | ICD-10-CM

## 2023-03-28 DIAGNOSIS — L02611 Cutaneous abscess of right foot: Secondary | ICD-10-CM

## 2023-03-28 DIAGNOSIS — E1151 Type 2 diabetes mellitus with diabetic peripheral angiopathy without gangrene: Secondary | ICD-10-CM

## 2023-03-28 HISTORY — PX: I & D EXTREMITY: SHX5045

## 2023-03-28 LAB — CBC
HCT: 27 % — ABNORMAL LOW (ref 39.0–52.0)
Hemoglobin: 8.5 g/dL — ABNORMAL LOW (ref 13.0–17.0)
MCH: 31.7 pg (ref 26.0–34.0)
MCHC: 31.5 g/dL (ref 30.0–36.0)
MCV: 100.7 fL — ABNORMAL HIGH (ref 80.0–100.0)
Platelets: 495 10*3/uL — ABNORMAL HIGH (ref 150–400)
RBC: 2.68 MIL/uL — ABNORMAL LOW (ref 4.22–5.81)
RDW: 14.3 % (ref 11.5–15.5)
WBC: 12.6 10*3/uL — ABNORMAL HIGH (ref 4.0–10.5)
nRBC: 0 % (ref 0.0–0.2)

## 2023-03-28 LAB — GLUCOSE, CAPILLARY
Glucose-Capillary: 101 mg/dL — ABNORMAL HIGH (ref 70–99)
Glucose-Capillary: 87 mg/dL (ref 70–99)
Glucose-Capillary: 103 mg/dL — ABNORMAL HIGH (ref 70–99)
Glucose-Capillary: 98 mg/dL (ref 70–99)
Glucose-Capillary: 109 mg/dL — ABNORMAL HIGH (ref 70–99)
Glucose-Capillary: 166 mg/dL — ABNORMAL HIGH (ref 70–99)
Glucose-Capillary: 150 mg/dL — ABNORMAL HIGH (ref 70–99)
Glucose-Capillary: 152 mg/dL — ABNORMAL HIGH (ref 70–99)

## 2023-03-28 LAB — AEROBIC/ANAEROBIC CULTURE W GRAM STAIN (SURGICAL/DEEP WOUND)

## 2023-03-28 SURGERY — IRRIGATION AND DEBRIDEMENT EXTREMITY
Anesthesia: Monitor Anesthesia Care | Laterality: Right

## 2023-03-28 MED ORDER — SODIUM CHLORIDE 0.9 % IV SOLN: INTRAVENOUS | Status: DC

## 2023-03-28 MED ORDER — CHLORHEXIDINE GLUCONATE 0.12 % MT SOLN: 15.0000 mL | Freq: Once | OROMUCOSAL | Status: AC

## 2023-03-28 MED ORDER — LIDOCAINE HCL (PF) 1 % IJ SOLN
INTRAMUSCULAR | Status: DC | PRN
  Administered 2023-03-28: 30 mL

## 2023-03-28 MED ORDER — MAGNESIUM CITRATE PO SOLN: 1.0000 | Freq: Once | ORAL | Status: DC | PRN

## 2023-03-28 MED ORDER — VITAMIN C 500 MG PO TABS: 1000.0000 mg | ORAL_TABLET | Freq: Every day | ORAL | Status: DC

## 2023-03-28 MED ORDER — PANTOPRAZOLE SODIUM 40 MG PO TBEC: 40.0000 mg | DELAYED_RELEASE_TABLET | Freq: Every day | ORAL | Status: DC

## 2023-03-28 MED ORDER — PHENOL 1.4 % MT LIQD: 1.0000 | OROMUCOSAL | Status: DC | PRN

## 2023-03-28 MED ORDER — JUVEN PO PACK: 1.0000 | PACK | Freq: Two times a day (BID) | ORAL | Status: DC

## 2023-03-28 MED ORDER — HYDRALAZINE HCL 20 MG/ML IJ SOLN: 5.0000 mg | INTRAMUSCULAR | Status: DC | PRN

## 2023-03-28 MED ORDER — INSULIN ASPART 100 UNIT/ML IJ SOLN: 0.0000 [IU] | INTRAMUSCULAR | Status: DC | PRN

## 2023-03-28 MED ORDER — LIDOCAINE HCL (CARDIAC) PF 100 MG/5ML IV SOSY
PREFILLED_SYRINGE | INTRAVENOUS | Status: DC | PRN
  Administered 2023-03-28: 20 mg via INTRAVENOUS

## 2023-03-28 MED ORDER — LIDOCAINE HCL (PF) 1 % IJ SOLN
INTRAMUSCULAR | Status: AC
  Filled 2023-03-28: qty 30

## 2023-03-28 MED ORDER — MAGNESIUM SULFATE 2 GM/50ML IV SOLN: 2.0000 g | Freq: Every day | INTRAVENOUS | Status: DC | PRN

## 2023-03-28 MED ORDER — PROPOFOL 10 MG/ML IV BOLUS
INTRAVENOUS | Status: AC
  Filled 2023-03-28: qty 20

## 2023-03-28 MED ORDER — POTASSIUM CHLORIDE CRYS ER 20 MEQ PO TBCR: 20.0000 meq | EXTENDED_RELEASE_TABLET | Freq: Every day | ORAL | Status: DC | PRN

## 2023-03-28 MED ORDER — PHENYLEPHRINE 80 MCG/ML (10ML) SYRINGE FOR IV PUSH (FOR BLOOD PRESSURE SUPPORT)
PREFILLED_SYRINGE | INTRAVENOUS | Status: AC
  Filled 2023-03-28: qty 10

## 2023-03-28 MED ORDER — MIDAZOLAM HCL 5 MG/5ML IJ SOLN
INTRAMUSCULAR | Status: DC | PRN
  Administered 2023-03-28: 2 mg via INTRAVENOUS

## 2023-03-28 MED ORDER — CHLORHEXIDINE GLUCONATE 4 % EX SOLN: 60.0000 mL | Freq: Once | CUTANEOUS | Status: DC

## 2023-03-28 MED ORDER — HYDROMORPHONE HCL 1 MG/ML IJ SOLN: 0.5000 mg | INTRAMUSCULAR | Status: DC | PRN

## 2023-03-28 MED ORDER — OXYCODONE HCL 5 MG PO TABS: 10.0000 mg | ORAL_TABLET | ORAL | Status: DC | PRN

## 2023-03-28 MED ORDER — METOPROLOL TARTRATE 5 MG/5ML IV SOLN: 2.0000 mg | INTRAVENOUS | Status: DC | PRN

## 2023-03-28 MED ORDER — BISACODYL 5 MG PO TBEC: 5.0000 mg | DELAYED_RELEASE_TABLET | Freq: Every day | ORAL | Status: DC | PRN

## 2023-03-28 MED ORDER — MIDAZOLAM HCL 2 MG/2ML IJ SOLN
INTRAMUSCULAR | Status: AC
  Filled 2023-03-28: qty 2

## 2023-03-28 MED ORDER — GUAIFENESIN-DM 100-10 MG/5ML PO SYRP: 15.0000 mL | ORAL_SOLUTION | ORAL | Status: DC | PRN

## 2023-03-28 MED ORDER — ONDANSETRON HCL 4 MG/2ML IJ SOLN
INTRAMUSCULAR | Status: DC | PRN
  Administered 2023-03-28: 4 mg via INTRAVENOUS

## 2023-03-28 MED ORDER — CEFAZOLIN SODIUM-DEXTROSE 2-4 GM/100ML-% IV SOLN
2.0000 g | Freq: Three times a day (TID) | INTRAVENOUS | Status: AC
  Administered 2023-03-28 – 2023-03-29 (×2): 2 g via INTRAVENOUS
  Filled 2023-03-28: qty 100

## 2023-03-28 MED ORDER — FENTANYL CITRATE (PF) 100 MCG/2ML IJ SOLN
INTRAMUSCULAR | Status: DC | PRN
  Administered 2023-03-28 (×2): 25 ug via INTRAVENOUS

## 2023-03-28 MED ORDER — ONDANSETRON HCL 4 MG/2ML IJ SOLN: 4.0000 mg | Freq: Four times a day (QID) | INTRAMUSCULAR | Status: DC | PRN

## 2023-03-28 MED ORDER — LABETALOL HCL 5 MG/ML IV SOLN: 10.0000 mg | INTRAVENOUS | Status: DC | PRN

## 2023-03-28 MED ORDER — PROPOFOL 10 MG/ML IV BOLUS
INTRAVENOUS | Status: DC | PRN
  Administered 2023-03-28: 20 mg via INTRAVENOUS
  Administered 2023-03-28 (×5): 10 mg via INTRAVENOUS

## 2023-03-28 MED ORDER — CEFAZOLIN SODIUM-DEXTROSE 2-4 GM/100ML-% IV SOLN
2.0000 g | INTRAVENOUS | Status: AC
  Administered 2023-03-28: 2 g via INTRAVENOUS

## 2023-03-28 MED ORDER — ONDANSETRON HCL 4 MG/2ML IJ SOLN
INTRAMUSCULAR | Status: AC
  Filled 2023-03-28: qty 2

## 2023-03-28 MED ORDER — CHLORHEXIDINE GLUCONATE 0.12 % MT SOLN
OROMUCOSAL | Status: AC
  Administered 2023-03-28: 15 mL via OROMUCOSAL
  Filled 2023-03-28: qty 15

## 2023-03-28 MED ORDER — CEFAZOLIN SODIUM-DEXTROSE 2-4 GM/100ML-% IV SOLN
INTRAVENOUS | Status: AC
  Filled 2023-03-28: qty 100

## 2023-03-28 MED ORDER — ALUM & MAG HYDROXIDE-SIMETH 200-200-20 MG/5ML PO SUSP: 15.0000 mL | ORAL | Status: DC | PRN

## 2023-03-28 MED ORDER — OXYCODONE HCL 5 MG PO TABS
5.0000 mg | ORAL_TABLET | ORAL | Status: DC | PRN
  Administered 2023-04-01: 5 mg via ORAL
  Filled 2023-03-28: qty 2

## 2023-03-28 MED ORDER — DOCUSATE SODIUM 100 MG PO CAPS: 100.0000 mg | ORAL_CAPSULE | Freq: Every day | ORAL | Status: DC

## 2023-03-28 MED ORDER — POVIDONE-IODINE 10 % EX SWAB: 2.0000 | Freq: Once | CUTANEOUS | Status: DC

## 2023-03-28 MED ORDER — PHENYLEPHRINE 80 MCG/ML (10ML) SYRINGE FOR IV PUSH (FOR BLOOD PRESSURE SUPPORT)
PREFILLED_SYRINGE | INTRAVENOUS | Status: DC | PRN
  Administered 2023-03-28 (×2): 80 ug via INTRAVENOUS

## 2023-03-28 MED ORDER — FENTANYL CITRATE (PF) 100 MCG/2ML IJ SOLN
INTRAMUSCULAR | Status: AC
  Filled 2023-03-28: qty 2

## 2023-03-28 MED ORDER — 0.9 % SODIUM CHLORIDE (POUR BTL) OPTIME
TOPICAL | Status: DC | PRN
  Administered 2023-03-28: 1000 mL

## 2023-03-28 MED ORDER — POLYETHYLENE GLYCOL 3350 17 G PO PACK: 17.0000 g | PACK | Freq: Every day | ORAL | Status: DC | PRN

## 2023-03-28 MED ORDER — LACTATED RINGERS IV SOLN: INTRAVENOUS | Status: DC

## 2023-03-28 MED ORDER — ORAL CARE MOUTH RINSE: 15.0000 mL | Freq: Once | OROMUCOSAL | Status: AC

## 2023-03-28 MED ORDER — ZINC SULFATE 220 (50 ZN) MG PO CAPS: 220.0000 mg | ORAL_CAPSULE | Freq: Every day | ORAL | Status: DC

## 2023-03-28 MED ORDER — ACETAMINOPHEN 325 MG PO TABS: 325.0000 mg | ORAL_TABLET | Freq: Four times a day (QID) | ORAL | Status: DC | PRN

## 2023-03-28 SURGICAL SUPPLY — 41 items
BAG COUNTER SPONGE SURGICOUNT (BAG) IMPLANT
BAG SPNG CNTER NS LX DISP (BAG)
BLADE SURG 21 STRL SS (BLADE) ×1 IMPLANT
BNDG CMPR 5X4 CHSV STRCH STRL (GAUZE/BANDAGES/DRESSINGS) ×1
BNDG CMPR 5X6 CHSV STRCH STRL (GAUZE/BANDAGES/DRESSINGS)
BNDG COHESIVE 4X5 TAN STRL LF (GAUZE/BANDAGES/DRESSINGS) IMPLANT
BNDG COHESIVE 6X5 TAN ST LF (GAUZE/BANDAGES/DRESSINGS) IMPLANT
BNDG GAUZE DERMACEA FLUFF 4 (GAUZE/BANDAGES/DRESSINGS) ×2 IMPLANT
BNDG GZE DERMACEA 4 6PLY (GAUZE/BANDAGES/DRESSINGS) ×2
CANISTER PREVENA 45 (CANNISTER) IMPLANT
COVER SURGICAL LIGHT HANDLE (MISCELLANEOUS) ×2 IMPLANT
DRAPE DERMATAC (DRAPES) IMPLANT
DRAPE U-SHAPE 47X51 STRL (DRAPES) ×1 IMPLANT
DRESSING VERAFLO CLEANS CC MED (GAUZE/BANDAGES/DRESSINGS) IMPLANT
DRSG ADAPTIC 3X8 NADH LF (GAUZE/BANDAGES/DRESSINGS) ×1 IMPLANT
DRSG VERAFLO CLEANSE CC MED (GAUZE/BANDAGES/DRESSINGS) ×1
DURAPREP 26ML APPLICATOR (WOUND CARE) ×1 IMPLANT
ELECT REM PT RETURN 9FT ADLT (ELECTROSURGICAL)
ELECTRODE REM PT RTRN 9FT ADLT (ELECTROSURGICAL) IMPLANT
GAUZE SPONGE 4X4 12PLY STRL (GAUZE/BANDAGES/DRESSINGS) ×1 IMPLANT
GLOVE BIOGEL PI IND STRL 9 (GLOVE) ×1 IMPLANT
GLOVE SURG ORTHO 9.0 STRL STRW (GLOVE) ×1 IMPLANT
GOWN STRL REUS W/ TWL XL LVL3 (GOWN DISPOSABLE) ×2 IMPLANT
GOWN STRL REUS W/TWL XL LVL3 (GOWN DISPOSABLE) ×2
GRAFT SKIN WND MICRO 38 (Tissue) IMPLANT
HANDPIECE INTERPULSE COAX TIP (DISPOSABLE)
KIT BASIN OR (CUSTOM PROCEDURE TRAY) ×1 IMPLANT
KIT DRSG PREVENA PLUS 7DAY 125 (MISCELLANEOUS) IMPLANT
KIT TURNOVER KIT B (KITS) ×1 IMPLANT
MANIFOLD NEPTUNE II (INSTRUMENTS) ×1 IMPLANT
NS IRRIG 1000ML POUR BTL (IV SOLUTION) ×1 IMPLANT
PACK ORTHO EXTREMITY (CUSTOM PROCEDURE TRAY) ×1 IMPLANT
PAD ARMBOARD 7.5X6 YLW CONV (MISCELLANEOUS) ×2 IMPLANT
SET HNDPC FAN SPRY TIP SCT (DISPOSABLE) IMPLANT
STOCKINETTE IMPERVIOUS 9X36 MD (GAUZE/BANDAGES/DRESSINGS) IMPLANT
SUT ETHILON 2 0 PSLX (SUTURE) ×1 IMPLANT
SWAB COLLECTION DEVICE MRSA (MISCELLANEOUS) ×1 IMPLANT
SWAB CULTURE ESWAB REG 1ML (MISCELLANEOUS) IMPLANT
TOWEL GREEN STERILE (TOWEL DISPOSABLE) ×1 IMPLANT
TUBE CONNECTING 12X1/4 (SUCTIONS) ×1 IMPLANT
YANKAUER SUCT BULB TIP NO VENT (SUCTIONS) ×1 IMPLANT

## 2023-03-28 NOTE — Progress Notes (Signed)
Physical Therapy Session Note  Patient Details  Name: TORREY PICCINI MRN: [id number] Date of Birth: [birth year]  Today's Date: 03/28/2023 PT Individual Time: 8469-6295 PT Individual Time Calculation (min): 37 min   Short Term Goals: Week 1:  PT Short Term Goal 1 (Week 1): Therapist will initiate power wheelchair consult (Wheelchair assessment scheduled for 5/16) PT Short Term Goal 1 - Progress (Week 1): Progressing toward goal PT Short Term Goal 2 (Week 1): Pt will initiate gait training PT Short Term Goal 2 - Progress (Week 1): Discontinued (comment) (Discontinued as pt now NWB R LE) PT Short Term Goal 3 (Week 1): Pt will perform sit<>stand with CGA and LRAD (Discontinued as pt now NWB R LE) PT Short Term Goal 3 - Progress (Week 1): Discontinued (comment) PT Short Term Goal 4 (Week 1): Pt will perform bed to chair transfer with CGA and LRAD PT Short Term Goal 4 - Progress (Week 1): Met Week 2:  PT Short Term Goal 1 (Week 2): STG=LTG 2/2 ELOS  Skilled Therapeutic Interventions/Progress Updates:     Pt BP low and therapy session remained in room for therapeutic exercise to monitor Rt foot drainage and BP throughout. Session focused on UE/LE strengthening.  BP at start of session 1447:79/51 mmHg HR 76 bpm   Pt completed seated UE and LE strengthening in TIS power WC, slightly reclined position.  Pt using 5lbs dowel rod - 2x15 chest press  - 2x15 bicep curl - 2x15 quad set Lt/Rt  1505: 91/59 mmHg HR 78 bpm following exercises  - Rt LE LAQ 1x 15 reps  1511: 85/58 mmHg HR 78 bpm  Pt c/o feeling clod and requesting sweater. Assisted with donning sweater in sitting, pt required min assist to manage donning arm sleeve on Rt due to IV and assist to pull sweater over head as material getting caught on hair.  LE exercises continued with Bil LE resting down.  - Rt LE LAQ 1x 15 reps - Rt LE marching 2x 15 reps - Lt LE SLR in tilted back WC   EOS pt  requesting to remain in TIS power  WC. BP EOS 87/55 mmhg HR 82 bpm. Pt continued to c/o cold and provided 2 warm blankets. Lab tech in room on arrival at EOS.  Therapy Documentation Precautions:  Precautions Precautions: Fall Precaution Comments: L BKA, R foot wound, wound vac Required Braces or Orthoses: Other Brace Other Brace: L LE limb protector Restrictions Weight Bearing Restrictions: Yes RLE Weight Bearing: Non weight bearing LLE Weight Bearing: Non weight bearing  Pain: Pain Assessment Pain Scale: 0-10 Pain Score: 0-No pain    Therapy/Group: Individual Therapy   Wynn Maudlin, DPT Acute Rehabilitation Services Office [phone]  03/28/23 12:44 PM

## 2023-03-28 NOTE — Progress Notes (Signed)
Physical Therapy Session Note  Patient Details  Name: Jesse [name] MRN: [id number] Date of Birth: [birth year]  Today's Date: 03/28/2023 PT Missed Time: 30 Minutes Missed Time Reason: Unavailable (Comment) (procedure)  Skilled Therapeutic Interventions/Progress Updates:   Pt off unit for procedure, therefore missed 30 minutes of skilled physical therapy. Will attempt to make up missed time as pt becomes available.   Therapy Documentation Precautions:  Precautions Precautions: Fall Precaution Comments: L BKA, R foot wound, wound vac Required Braces or Orthoses: Other Brace Other Brace: L LE limb protector Restrictions Weight Bearing Restrictions: Yes RLE Weight Bearing: Non weight bearing LLE Weight Bearing: Non weight bearing  Therapy/Group: Individual Therapy Marlana Salvage Zaunegger Blima Rich PT, DPT 03/28/2023, 7:15 AM

## 2023-03-28 NOTE — Progress Notes (Signed)
Occupational Therapy Weekly Progress Note  Patient Details  Name: Jesse [name] MRN: [id number] Date of Birth: Jul 08, 1961  Beginning of progress report period: Mar 19, 2023 End of progress report period: Mar 28, 2023     Patient has met 2 of 3 short term goals.  Discontinued STG related to standing up from w/c due to pt's current WB restrictions of TDWB on the RLE.   Patient continues to demonstrate the following deficits: muscle weakness, and decreased sitting balance, decreased postural control, decreased balance strategies, and difficulty maintaining precautions and therefore will continue to benefit from skilled OT intervention to enhance overall performance with BADL and iADL.  Patient progressing toward long term goals..  Continue plan of care.  OT Short Term Goals Week 1:  OT Short Term Goal 1 (Week 1): Pt will complete toileting tasks with (S) overall OT Short Term Goal 1 - Progress (Week 1): Met OT Short Term Goal 2 (Week 1): Pt will complete stand from the w/c with CGA OT Short Term Goal 2 - Progress (Week 1): Discontinued (comment) (Standing goal no longer appropriate as pt is TDWB on RLE as of 03/28/23.) OT Short Term Goal 3 (Week 1): Pt will complete toilet transfer using LRAD with (S) OT Short Term Goal 3 - Progress (Week 1): Met Week 2:  OT Short Term Goal 1 (Week 2): STG = LTGS (d/t ELOS)   Therapy Documentation Precautions:  Precautions Precautions: Fall Precaution Comments: L BKA, R foot wound, wound vac Required Braces or Orthoses: Other Brace Other Brace: L LE limb protector Restrictions Weight Bearing Restrictions: Yes RLE Weight Bearing: Non weight bearing LLE Weight Bearing: Non weight bearing  Therapy/Group: Individual Therapy  Limmie Patricia, OTR/L,CBIS  Supplemental OT - [facility] Secure Chat Preferred   03/28/2023, 2:46 PM

## 2023-03-28 NOTE — Anesthesia Preprocedure Evaluation (Addendum)
Anesthesia Evaluation  Patient identified by MRN, date of birth, ID band Patient awake    Reviewed: Allergy & Precautions, NPO status , Patient's Chart, lab work & pertinent test results  Airway Mallampati: II  TM Distance: >3 FB Neck ROM: Full    Dental  (+) Poor Dentition, Missing   Pulmonary neg pulmonary ROS   Pulmonary exam normal        Cardiovascular hypertension, + Peripheral Vascular Disease  Normal cardiovascular exam  ECHO: 1. Left ventricular ejection fraction, by estimation, is 60 to 65%. The  left ventricle has normal function. The left ventricle has no regional  wall motion abnormalities. Left ventricular diastolic parameters were  normal.   2. Right ventricular systolic function is normal. The right ventricular  size is normal. Tricuspid regurgitation signal is inadequate for assessing  PA pressure.   3. The mitral valve is normal in structure. Trivial mitral valve  regurgitation. No evidence of mitral stenosis.   4. The aortic valve has an indeterminant number of cusps. There is  moderate calcification of the aortic valve. Aortic valve regurgitation is  not visualized. Mild to moderate aortic valve sclerosis/calcification is  present, without any evidence of aortic   stenosis.   5. The inferior vena cava is dilated in size with <50% respiratory  variability, suggesting right atrial pressure of 15 mmHg.     Neuro/Psych negative neurological ROS  negative psych ROS   GI/Hepatic negative GI ROS,,,(+)     substance abuse    Endo/Other  diabetes, Insulin Dependent    Renal/GU Renal disease     Musculoskeletal negative musculoskeletal ROS (+)    Abdominal   Peds  Hematology  (+) Blood dyscrasia (Plavix), anemia   Anesthesia Other Findings Abscess Right Heel  Reproductive/Obstetrics                             Anesthesia Physical Anesthesia Plan  ASA: 3  Anesthesia  Plan: MAC   Post-op Pain Management:    Induction: Intravenous  PONV Risk Score and Plan: 1 and Ondansetron, Dexamethasone, Propofol infusion, Midazolam and Treatment may vary due to age or medical condition  Airway Management Planned: Simple Face Mask  Additional Equipment:   Intra-op Plan:   Post-operative Plan:   Informed Consent: I have reviewed the patients History and Physical, chart, labs and discussed the procedure including the risks, benefits and alternatives for the proposed anesthesia with the patient or authorized representative who has indicated his/her understanding and acceptance.     Dental advisory given  Plan Discussed with: CRNA  Anesthesia Plan Comments:        Anesthesia Quick Evaluation

## 2023-03-28 NOTE — Op Note (Signed)
03/28/2023  9:30 AM  PATIENT:  Jesse [name]    PRE-OPERATIVE DIAGNOSIS:  Abscess Right Heel  POST-OPERATIVE DIAGNOSIS:  Same  PROCEDURE:  RIGHT HEEL EXCISIONAL DEBRIDEMENT of skin and soft tissue and fascia. Tissue was sent for cultures. Application of Kerecis micro graft 38 cm. Application of cleanse choice wound VAC sponge x 1.  SURGEON:  Nadara Mustard, MD  PHYSICIAN ASSISTANT:None ANESTHESIA:   General  PREOPERATIVE INDICATIONS:  Jesse [name] is a  62 y.o. male with a diagnosis of Abscess Right Heel who failed conservative measures and elected for surgical management.    The risks benefits and alternatives were discussed with the patient preoperatively including but not limited to the risks of infection, bleeding, nerve injury, cardiopulmonary complications, the need for revision surgery, among others, and the patient was willing to proceed.  OPERATIVE IMPLANTS:   Implant Name Type Inv. Item Serial No. Manufacturer Lot No. LRB No. Used Action  GRAFT SKIN WND MICRO 38 - [id number] Tissue GRAFT SKIN WND MICRO 38  [employer] [id number] Right 1 Implanted    @ENCIMAGES @  OPERATIVE FINDINGS: Patient had large necrotic wound that extended down to the fascia of the calcaneus.  Calcaneus with mild involvement.  Tissue was sent for cultures.  Patient will need long-term antibiotics based on cultures.  OPERATIVE PROCEDURE: Patient was brought the operating room and underwent MAC anesthetic.  After adequate levels anesthesia obtained patient's right lower extremity was prepped using DuraPrep draped into a sterile field a timeout was called.  Patient underwent a local block with 10 cc of 1% lidocaine plain.  A elliptical incision was made around the ulcerative tissue this left a wound that was 6 x 5 cm and 2 cm deep.  Tissue margins were clear electrocautery was used hemostasis wound was irrigated normal saline.  The micro powder was applied 38 cm of Kerecis graft.  This was  covered with a cleanse choice wound VAC sponge secured with derma tack Ioban and a Coban wrap.  Patient was taken the PACU in stable condition.   DISCHARGE PLANNING:  Antibiotic duration: Continue antibiotics based on culture sensitivities.  Proceed  Weightbearing: Touchdown weightbearing on the right  Pain medication: Opioid pathway  Dressing care/ Wound VAC: Discharge with the Praveena plus portable wound VAC pump.  Ambulatory devices: Walker  Discharge to: Discharge planning based on social worker recommendations  Follow-up: In the office 1 week post operative.

## 2023-03-28 NOTE — Progress Notes (Signed)
Patient right heel dressing site was leaking post debridement procedure. Wound vac  previa was replaced with hospital wound vac/dressing reinforced. MD aware.

## 2023-03-28 NOTE — Progress Notes (Signed)
Occupational Therapy Session Note  Patient Details  Name: Jesse [name] MRN: [id number] Date of Birth: 1961-02-26  AM Session: Today's Date: 03/28/2023 OT Missed Time: 60 Minutes Missed Time Reason: Unavailable (comment) Pt missed scheduled OT session this morning. Currently in OR for debridement of right heel.   PM Session: Today's Date: 03/28/2023 OT Individual Time: 1300-1330 OT Individual Time Calculation (min): 30 min   Short Term Goals: Week 1:  OT Short Term Goal 1 (Week 1): Pt will complete toileting tasks with (S) overall OT Short Term Goal 2 (Week 1): Pt will complete stand from the w/c with CGA OT Short Term Goal 3 (Week 1): Pt will complete toilet transfer using LRAD with (S)  Skilled Therapeutic Interventions/Progress Updates:    PM Session: Patient agreeable to participate in OT session. Reports 0/10 pain level.   Patient participated in skilled OT session focusing on ADL re-training, patient education, and functional transfers. When readjusting left thumb protector, this OT noticed incisional drainage that had soaked through shrinker onto limb protector. Nursing was notified and provided pt care, applying fresh gauze pad. OT hand washed shrinker and applied new clean shrinker to left residual limb; pt requiring total assist.  Educated pt on updated WB status to RLE of TDWB. Pt verbalized understanding. LB dressing completed at bed level with Mod A. OT managed wound vac and laced BLE into shorts while pt completed rolling right and left to pull shorts up over hips. Mod I level demonstrated for supine to sit. While seated on EOB, pt completed UB dressing with Set-up. With TIS chair positioned on right side of pt, he completed a lateral scoot transfer with SBA. RLE was propped up on stationary chair with 2 pillows for elevation.        Therapy Documentation Precautions:  Precautions Precautions: Fall Precaution Comments: L BKA, R foot wound, wound vac Required  Braces or Orthoses: Other Brace Other Brace: L LE limb protector Restrictions Weight Bearing Restrictions: Yes RLE Weight Bearing: Non weight bearing LLE Weight Bearing: Non weight bearing  Therapy/Group: Individual Therapy  Limmie Patricia, OTR/L,CBIS  Supplemental OT - [facility] Secure Chat Preferred   03/28/2023, 7:58 AM

## 2023-03-28 NOTE — Progress Notes (Signed)
Patient ID: Jesse [name], male   DOB: 1960/12/24, 62 y.o.   MRN: [id number] Patient is status post debridement right heel ulcer.  Examination at bedside the wound VAC has a good suction fit.  There is minimal drainage.  The outside dressing may be changed as needed.

## 2023-03-28 NOTE — Progress Notes (Signed)
Physical Therapy Session Note  Patient Details  Name: Jesse [name] MRN: [id number] Date of Birth: August 19, 1961  Today's Date: 03/28/2023 PT Missed Time: 45 Minutes Missed Time Reason: Unavailable (Comment) (just returned from OR and pt's wound vac not sealed properly.)  Short Term Goals: Week 1:  PT Short Term Goal 1 (Week 1): Therapist will initiate power wheelchair consult (Wheelchair assessment scheduled for 5/16) PT Short Term Goal 1 - Progress (Week 1): Progressing toward goal PT Short Term Goal 2 (Week 1): Pt will initiate gait training PT Short Term Goal 2 - Progress (Week 1): Discontinued (comment) (Discontinued as pt now NWB R LE) PT Short Term Goal 3 (Week 1): Pt will perform sit<>stand with CGA and LRAD (Discontinued as pt now NWB R LE) PT Short Term Goal 3 - Progress (Week 1): Discontinued (comment) PT Short Term Goal 4 (Week 1): Pt will perform bed to chair transfer with CGA and LRAD PT Short Term Goal 4 - Progress (Week 1): Met Week 2:  PT Short Term Goal 1 (Week 2): STG=LTG 2/2 ELOS  Skilled Therapeutic Interventions/Progress Updates:  Pt off unit for procedure, therefore missed 45 minutes of skilled physical therapy. Will attempt to make up missed time as pt becomes available.   Therapy Documentation Precautions:  Precautions Precautions: Fall Precaution Comments: L BKA, R foot wound, wound vac Required Braces or Orthoses: Other Brace Other Brace: L LE limb protector Restrictions Weight Bearing Restrictions: Yes RLE Weight Bearing: Non weight bearing LLE Weight Bearing: Non weight bearing    Therapy/Group: Individual Therapy   Wynn Maudlin, DPT Acute Rehabilitation Services Office [phone]  03/28/23 12:44 PM

## 2023-03-28 NOTE — Progress Notes (Signed)
PROGRESS NOTE   Subjective/Complaints: Dr. Lajoyce Corners completed R heel debridement today. Pt denies pain. He is agreeable to SNF placement.     Review of Systems  Constitutional:  Negative for fever.  HENT:  Negative for congestion.   Respiratory:  Negative for shortness of breath.   Cardiovascular:  Negative for chest pain.  Gastrointestinal:  Negative for abdominal pain, diarrhea, nausea and vomiting.  Genitourinary:  Negative for dysuria.  Musculoskeletal:  Negative for joint pain.  Neurological:  Positive for sensory change and weakness. Negative for dizziness, speech change and headaches.     Objective:   MR HEEL RIGHT WO CONTRAST  Result Date: 03/26/2023 CLINICAL DATA:  Osteomyelitis suspected, ankle, xray done EXAM: MR OF THE RIGHT HEEL WITHOUT CONTRAST TECHNIQUE: Multiplanar, multisequence MR imaging of the right heel was performed. No intravenous contrast was administered. COMPARISON:  Radiograph 03/26/2023, MRI 03/03/2023 FINDINGS: Bones/Joint/Cartilage There is minimal marrow edema signal within the plantar posterior calcaneus, similar to prior exam (sagittal stir images 15-19). Preserved T1 marrow signal. The cortex is intact. There is no evidence of acute fracture. Mild degenerative change in the midfoot with dorsal spurring. Plantar and dorsal calcaneal spurring. Ligaments Intact medial and lateral ankle ligaments with chronic deep deltoid sprain. Intact high ankle ligaments. Muscles and Tendons Ruptured plantar fascia in the plantar midfoot, unchanged from prior exam. Thickening of the proximal plantar fascia near the calcaneal insertion. Soft tissues There is a plantar heel soft tissue wound which extends to within 6 mm of the proximal plantar fascia. There is extensive adjacent edema signal without well-defined/organized collection. Additional plantar ulcer more distally and another along the lateral midfoot with underlying  soft tissue swelling but no organized fluid collection. There is focal susceptibility artifact in the medial midfoot corresponding to a stable seen on recent radiograph. IMPRESSION: Plantar soft tissue ulcers, largest along the plantar heel, extending to within 6 mm of the underlying proximal plantar fascia. Minimal subcortical marrow edema in the posterior plantar calcaneus, similar to prior exam, favored to reflect reactive marrow change. No definitive osteomyelitis. No evidence of an organized soft tissue abscess. Electronically Signed   By: Caprice Renshaw M.D.   On: 03/26/2023 13:32   Recent Labs    03/27/23 0658  WBC 9.2  HGB 9.2*  HCT 29.1*  PLT 493*    Recent Labs    03/27/23 0658  NA 136  K 4.4  CL 105  CO2 23  GLUCOSE 120*  BUN 35*  CREATININE 0.99  CALCIUM 9.2     Intake/Output Summary (Last 24 hours) at 03/28/2023 1142 Last data filed at 03/28/2023 0917 Gross per 24 hour  Intake 1180 ml  Output 1450 ml  Net -270 ml         Physical Exam: Vital Signs Blood pressure 130/72, pulse 84, temperature (!) 97.4 F (36.3 C), temperature source Oral, resp. rate 16, height 6' (1.829 m), weight 89 kg, SpO2 100 %.  Physical Exam: Blood pressure 117/66, pulse 75, temperature 98.2 F (36.8 C), temperature source Oral, resp. rate 18, height 6' (1.829 m), weight 96.3 kg, SpO2 99 %. Gen: no distress, normal appearing, sitting in wheelchair HEENT: oral mucosa pink and  moist, NCAT Cardio: RRR Chest: normal effort, normal rate of breathing Abd: soft, non-distended, positive bowel sounds Extremities: Right heel ulcer, left BKA Psych: Pt's affect is appropriate. Pt is cooperative  Musculoskeletal:     Right lower leg: Edema present.     Comments:  Wound vac in place to R heel, serosangeonous drainage noted, VAC does not appear to be suctioning correctly . Wearing limb protector on left lower extremity.  Neurological:     General: No focal deficit present.     Mental Status: He  is alert and oriented to person, place, and time.     Comments: Alert and oriented x 3. Normal insight and awareness. Intact Memory. Normal language and speech. Cranial nerve exam unremarkable. MMT: 5/5 BLE, RLE grossly 4/5 prox to distal with some limitations distally. LLE limited by vac but grossly 3-4/5. Decreased LT RLE below mid calf.  Psychiatric:        Mood and Affect: Appropriate, normal affect        Assessment/Plan: 1. Functional deficits which require 3+ hours per day of interdisciplinary therapy in a comprehensive inpatient rehab setting. Physiatrist is providing close team supervision and 24 hour management of active medical problems listed below. Physiatrist and rehab team continue to assess barriers to discharge/monitor patient progress toward functional and medical goals  Care Tool:  Bathing    Body parts bathed by patient: Right arm, Left arm, Chest, Abdomen, Front perineal area, Right upper leg, Left upper leg, Right lower leg, Face, Buttocks   Body parts bathed by helper: Buttocks Body parts n/a: Left lower leg (BKA)   Bathing assist Assist Level: Supervision/Verbal cueing     Upper Body Dressing/Undressing Upper body dressing   What is the patient wearing?: Pull over shirt    Upper body assist Assist Level: Supervision/Verbal cueing    Lower Body Dressing/Undressing Lower body dressing      What is the patient wearing?: Pants     Lower body assist Assist for lower body dressing: Minimal Assistance - Patient > 75%     Toileting Toileting    Toileting assist Assist for toileting: Moderate Assistance - Patient 50 - 74%     Transfers Chair/bed transfer  Transfers assist  Chair/bed transfer activity did not occur: N/A  Chair/bed transfer assist level: Supervision/Verbal cueing (SB)     Locomotion Ambulation   Ambulation assist   Ambulation activity did not occur: Safety/medical concerns (pt refused due to R LE pain/wounds)           Walk 10 feet activity   Assist  Walk 10 feet activity did not occur: Safety/medical concerns        Walk 50 feet activity   Assist Walk 50 feet with 2 turns activity did not occur: Safety/medical concerns         Walk 150 feet activity   Assist Walk 150 feet activity did not occur: Safety/medical concerns         Walk 10 feet on uneven surface  activity   Assist Walk 10 feet on uneven surfaces activity did not occur: Safety/medical concerns         Wheelchair     Assist Is the patient using a wheelchair?: Yes Type of Wheelchair: Manual    Wheelchair assist level: Supervision/Verbal cueing      Wheelchair 50 feet with 2 turns activity    Assist        Assist Level: Supervision/Verbal cueing   Wheelchair 150 feet activity  Assist      Assist Level: Supervision/Verbal cueing   Blood pressure 130/72, pulse 84, temperature (!) 97.4 F (36.3 C), temperature source Oral, resp. rate 16, height 6' (1.829 m), weight 89 kg, SpO2 100 %.  Medical Problem List and Plan: 1. 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  However discussed with patient I think he would have very difficult time caring for himself at his apartment, unlikely to be able to care for his wounds effectively.  Discussed concerns that if wounds are not taking care of this could increase the risk of additional surgeries and amputations.  Discussed looking into SNF, patient says he will consider  and let us know later today.  Will likely need to delay his discharge until early next week due to upcoming surgery.  -Pt agreeable to SNF  2.  Impaired mobility: continue Lovenox             -antiplatelet therapy: Aspirin and Plavix    3. Pain: Tylenol, oxycodone as needed             -continue gabapentin 100 mg TID             -pt reports reasonable pain control at present  4. Mood/Behavior/Sleep: LCSW to evaluate and provide emotional support             -antipsychotic agents: n/a   5. Neuropsych/cognition: This patient is capable of making decisions on his own behalf.   6. Wound vac:              -Prevena vac comes off on 5/11---placed order for removal, residual limb examined and healing well             -right heel ulcer; off-load and continue daily dressing changes  -Dicussed with Dr. Lajoyce Corners 5/9, ordered ARAFO to wear at all times ok to WB in [facility], otherwise NWB  -He continues to have drainage from his right foot wound, drainage with strong odor, last Dr. Lajoyce Corners for recommendations regarding wound management  -Debridement completed 5/17 by Dr. Lajoyce Corners, nursing to call [facility] to eval wound vac     7. Fluids/Electrolytes/Nutrition: Routine Is and Os and follow-up chemistries             -continue MVI and Vit C, zinc supplementation             -continue Juven   9: Hyperlipidemia: -Hold statin for a few days, recheck Monday -5/14 statin has been restarted   10: DM-2: CBGs QID; carb modified diet             -continue Semglee 5 units at bedtime  D/c bedtime ISS given excellent control and since patient will not have this at home  -5/17 controlled overall, CBGs lower than usual today due to recent NPO, diet to be resumed    CBG (last 3)  Recent Labs  03/28/23 0809 03/28/23 0915 03/28/23 1129  GLUCAP 87 103* 98       11: Hyponatremia: trend/follow-up BMP -5/8 Na stable 133 -5/16 sodium stable at 136      Latest Ref Rng & Units 03/27/2023    6:58 AM 03/24/2023    7:47 AM  03/19/2023    4:59 AM  BMP  Glucose 70 - 99 mg/dL 161  096  045   BUN 6 - 20 mg/dL 35  27  25   Creatinine 0.61 - 1.24 mg/dL 4.09  8.11  9.14   Sodium 135 - 145 mmol/L 136  137  133   Potassium 3.5 - 5.1 mmol/L 4.4  4.4  4.0   Chloride 98 - 111 mmol/L 105  106  103   CO2 22 - 32 mmol/L 23  22  20    Calcium 8.9 - 10.3 mg/dL 9.2  9.1  8.5       12: Elevated BUN: follow-up BMP             -encourage fluids  -5/8 BUN stable 25, continue to encourage fluid intake  -5/13 Cr down to 1.07  13: Leukocytosis: downward trend; follow-up CBC  -5/16 WBC stable at 9.2  14: Anemia: multifactorial/acute blood loss: follow-up CBC  -5/8 HGB stable at 8.9  -5/16 Hgb stable at 9.2  Recheck Monday unless has continued significant bleeding    15: 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 Lajoyce Corners on 4/07 to have legs wrapped with Dynaflex   16 Right Eye irritation  -continue Lubricating drops ordered Q4h prn  17.  Elevated LFTs -Hold statin for a few days, decrease Tylenol to every 6 hours as needed,  -AST down to 33, ALT still 60  -5/16 AST and ALT improved today  18: Thrombocytosis  -5/16 PLT down to 493, likely reactive  19. Dry skin: Eucerin ordered   LOS: 10 days A FACE TO FACE EVALUATION WAS PERFORMED  Fanny Dance 03/28/2023, 11:42 AM

## 2023-03-28 NOTE — Interval H&P Note (Signed)
History and Physical Interval Note:  03/28/2023 6:43 AM  Jesse [name]  has presented today for surgery, with the diagnosis of Abscess Right Heel.  The various methods of treatment have been discussed with the patient and family. After consideration of risks, benefits and other options for treatment, the patient has consented to  Procedure(s): RIGHT HEEL DEBRIDEMENT (Right) as a surgical intervention.  The patient's history has been reviewed, patient examined, no change in status, stable for surgery.  I have reviewed the patient's chart and labs.  Questions were answered to the patient's satisfaction.     Nadara Mustard

## 2023-03-28 NOTE — Progress Notes (Signed)
Patient ID: Jesse [name], male   DOB: 13-Dec-1960, 62 y.o.   MRN: [id number] Have sent out FL2 and currently have no bed offers. Pt's medicaid is not in network with many of the facilities. Will expand the search to other towns.

## 2023-03-28 NOTE — Consult Note (Signed)
WOC consult received for bleeding NPWT. This NPWT placed by Dr. Lajoyce Corners who will manage.   Thank you,    Priscella Mann MSN, RN-BC, [employer] [phone]

## 2023-03-28 NOTE — Progress Notes (Signed)
Pt been on clear liquids from midnight and NPO from 4:30 am per orders. G2 drink unavailable per dietary and pharmacy. On call provider Dan. A. (PA) notified.  CBG 101 @0623  Pre op checklist pending.  Dominica Severin   RN

## 2023-03-28 NOTE — Transfer of Care (Signed)
Immediate Anesthesia Transfer of Care Note  Patient: Jesse [name]  Procedure(s) Performed: RIGHT HEEL DEBRIDEMENT (Right)  Patient Location: PACU  Anesthesia Type:MAC  Level of Consciousness: awake, alert , and oriented  Airway & Oxygen Therapy: Patient Spontanous Breathing  Post-op Assessment: Report given to RN and Post -op Vital signs reviewed and stable  Post vital signs: Reviewed and stable  Last Vitals:  Vitals Value Taken Time  BP 122/77 03/28/23 0914  Temp 36.7 C 03/28/23 0914  Pulse 81 03/28/23 0916  Resp 16 03/28/23 0916  SpO2 98 % 03/28/23 0916  Vitals shown include unvalidated device data.  Last Pain:  Vitals:   03/28/23 0914  TempSrc:   PainSc: 0-No pain      Patients Stated Pain Goal: 0 (03/21/23 0759)  Complications: No notable events documented.

## 2023-03-29 ENCOUNTER — Encounter (HOSPITAL_COMMUNITY): Payer: Self-pay | Admitting: Orthopedic Surgery

## 2023-03-29 LAB — AEROBIC/ANAEROBIC CULTURE W GRAM STAIN (SURGICAL/DEEP WOUND)

## 2023-03-29 LAB — GLUCOSE, CAPILLARY
Glucose-Capillary: 124 mg/dL — ABNORMAL HIGH (ref 70–99)
Glucose-Capillary: 117 mg/dL — ABNORMAL HIGH (ref 70–99)
Glucose-Capillary: 163 mg/dL — ABNORMAL HIGH (ref 70–99)
Glucose-Capillary: 139 mg/dL — ABNORMAL HIGH (ref 70–99)

## 2023-03-29 NOTE — Progress Notes (Signed)
PROGRESS NOTE   Subjective/Complaints:  Pt reports he's been having a lot more pain of R heel ever since [facility] placed/ACE wrap placed- not sure what's hurting more- explained could even be actual heel debridement.   Usually taking pain meds 1x/day- not more.   LBM yesterday.      Review of Systems  Constitutional:  Negative for fever.  HENT:  Negative for congestion.   Respiratory:  Negative for shortness of breath.   Cardiovascular:  Negative for chest pain.  Gastrointestinal:  Negative for abdominal pain, diarrhea, nausea and vomiting.  Genitourinary:  Negative for dysuria.  Musculoskeletal:  Positive for joint pain.  Neurological:  Positive for sensory change and weakness. Negative for dizziness, speech change and headaches.     Objective:   No results found. Recent Labs    03/27/23 0658 03/28/23 1534  WBC 9.2 12.6*  HGB 9.2* 8.5*  HCT 29.1* 27.0*  PLT 493* 495*    Recent Labs    03/27/23 0658  NA 136  K 4.4  CL 105  CO2 23  GLUCOSE 120*  BUN 35*  CREATININE 0.99  CALCIUM 9.2     Intake/Output Summary (Last 24 hours) at 03/29/2023 1503 Last data filed at 03/29/2023 0732 Gross per 24 hour  Intake 1647.32 ml  Output 900 ml  Net 747.32 ml         Physical Exam: Vital Signs Blood pressure (!) 92/57, pulse 86, temperature 97.6 F (36.4 C), resp. rate 19, height 6' (1.829 m), weight 89 kg, SpO2 100 %.  Physical Exam: Blood pressure 117/66, pulse 75, temperature 98.2 F (36.8 C), temperature source Oral, resp. rate 18, height 6' (1.829 m), weight 96.3 kg, SpO2 99 %.    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 Pt is cooperative  Musculoskeletal:     Right lower leg: Edema present.     Comments:  Wound vac in place to R heel, serosangeonous drainage noted, VAC does not appear to be suctioning correctly . Wearing limb protector on left lower extremity.  Neurological:     General: No focal deficit present.     Mental Status: He is alert and oriented to person, place, and time.     Comments: Alert and oriented x 3. Normal insight and awareness. Intact Memory. Normal language and speech. Cranial nerve exam unremarkable. MMT: 5/5 BLE, RLE grossly 4/5 prox to distal with some limitations distally. LLE limited by vac but grossly 3-4/5. Decreased LT RLE below mid calf.  Psychiatric:        Mood and Affect: Appropriate, normal affect        Assessment/Plan: 1. Functional deficits which require 3+ hours per day of interdisciplinary therapy in a comprehensive inpatient rehab setting. Physiatrist is providing close team supervision and 24 hour management of active medical problems listed below. Physiatrist  and rehab team continue to assess barriers to discharge/monitor patient progress toward functional and medical goals  Care Tool:  Bathing    Body parts bathed by patient: Right arm, Left arm, Chest, Abdomen, Front perineal area, Right upper leg, Left upper leg, Right lower leg, Face, Buttocks   Body parts bathed by helper: Buttocks Body parts n/a: Left lower leg (BKA)   Bathing assist Assist Level: Supervision/Verbal cueing     Upper Body Dressing/Undressing Upper body dressing   What is the patient wearing?: Pull over shirt    Upper body assist Assist Level: Supervision/Verbal cueing    Lower Body Dressing/Undressing Lower body dressing      What is the patient wearing?: Pants     Lower body assist Assist for lower body dressing: Minimal Assistance - Patient > 75%      Toileting Toileting    Toileting assist Assist for toileting: Moderate Assistance - Patient 50 - 74%     Transfers Chair/bed transfer  Transfers assist  Chair/bed transfer activity did not occur: N/A  Chair/bed transfer assist level: Supervision/Verbal cueing (SB)     Locomotion Ambulation   Ambulation assist   Ambulation activity did not occur: Safety/medical concerns (pt refused due to R LE pain/wounds)          Walk 10 feet activity   Assist  Walk 10 feet activity did not occur: Safety/medical concerns        Walk 50 feet activity   Assist Walk 50 feet with 2 turns activity did not occur: Safety/medical concerns         Walk 150 feet activity   Assist Walk 150 feet activity did not occur: Safety/medical concerns         Walk 10 feet on uneven surface  activity   Assist Walk 10 feet on uneven surfaces activity did not occur: Safety/medical concerns         Wheelchair     Assist Is the patient using a wheelchair?: Yes Type of Wheelchair: Manual    Wheelchair assist level: Supervision/Verbal cueing      Wheelchair 50 feet with 2 turns activity    Assist        Assist Level: Supervision/Verbal cueing   Wheelchair 150 feet activity     Assist      Assist Level: Supervision/Verbal cueing   Blood pressure (!) 92/57, pulse 86, temperature 97.6 F (36.4 C), resp. rate 19, height 6' (1.829 m), weight 89 kg, SpO2 100 %.  Medical Problem List and Plan: 1. 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  However discussed with patient I think he would have very difficult time caring for himself at his apartment, unlikely to be able to care for his wounds effectively.  Discussed concerns that if wounds are not taking care of this could increase the risk of additional surgeries and amputations.  Discussed looking into  SNF, patient says he will consider and let us know later today.  Will likely need to delay his discharge until early next week due to upcoming surgery.  -Pt agreeable to SNF  Con't CIR PT and OT- wound VAC for now for R foot wound 2.  Impaired mobility: continue Lovenox             -antiplatelet therapy: Aspirin and Plavix    3. Pain: Tylenol, oxycodone as needed             -continue gabapentin 100 mg TID             -pt reports reasonable pain control at present  5/18- pt reports pain is really bad and only taking meds ~ 1x/day- asked him to ask for it more frequently.  4. Mood/Behavior/Sleep: LCSW to evaluate and provide emotional support             -antipsychotic agents: n/a   5. Neuropsych/cognition: This patient is capable of making decisions on his own behalf.   6. Wound vac:              -Prevena vac comes off on 5/11---placed order for removal, residual limb examined and healing well             -right heel ulcer; off-load and continue daily dressing changes  -Dicussed with Dr. Lajoyce Corners 5/9, ordered ARAFO to wear at all times ok to WB in [facility], otherwise NWB  -He continues to have drainage from his right foot wound, drainage with strong odor, last Dr. Lajoyce Corners for recommendations regarding wound management  -Debridement completed 5/17 by Dr. Lajoyce Corners, nursing to call WOC to eval wound vac  5/18- odor much better- VAC on R foot- s/p debridement- labs Monday to make sure Hb is stable   7. Fluids/Electrolytes/Nutrition: Routine Is and Os and follow-up chemistries             -continue MVI and Vit C, zinc supplementation              -continue Juven   9: Hyperlipidemia: -Hold statin for a few days, recheck Monday -5/14 statin has been restarted   10: DM-2: CBGs QID; carb modified diet             -continue Semglee 5 units at bedtime  D/c bedtime ISS given excellent control and since patient will not have this at home  -5/17 controlled overall, CBGs lower than usual today due to recent NPO, diet to be resumed   5/18- BG's 120s-150s- con't regimen   CBG (last 3)  Recent Labs    03/28/23 2107 03/29/23 0611 03/29/23 1130  GLUCAP 150* 124* 117*       11: Hyponatremia: trend/follow-up BMP -5/8 Na stable 133 -5/16 sodium stable at 136      Latest Ref Rng & Units 03/27/2023    6:58 AM 03/24/2023    7:47 AM 03/19/2023    4:59 AM  BMP  Glucose 70 - 99 mg/dL 161  096  045   BUN 6 - 20 mg/dL 35  27  25   Creatinine 0.61 - 1.24 mg/dL 4.09  8.11  9.14   Sodium 135 - 145 mmol/L 136  137  133   Potassium 3.5 - 5.1 mmol/L 4.4  4.4  4.0   Chloride 98 - 111 mmol/L 105  106  103   CO2 22 - 32 mmol/L 23  22  20    Calcium 8.9 - 10.3 mg/dL 9.2  9.1  8.5       12: Elevated BUN: follow-up BMP             -encourage fluids  -5/8 BUN stable 25, continue to encourage fluid intake  -5/13 Cr down to 1.07  13: Leukocytosis: downward trend; follow-up CBC  -5/16 WBC stable at 9.2  5/17- WBC 12.6- is afebrile- and s/p debridement- will recheck tomorrow 14: Anemia: multifactorial/acute blood loss: follow-up CBC  -5/8 HGB stable at 8.9  -5/16 Hgb stable at 9.2  Recheck Monday unless has continued significant bleeding    5.18- Hb 8.5- will recheck in AM 15: 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 Lajoyce Corners on 4/07 to have legs wrapped with Dynaflex   16 Right Eye irritation  -continue Lubricating drops ordered Q4h prn  17.  Elevated LFTs -Hold statin for a few days, decrease Tylenol to every 6 hours as needed,  -AST down to 33, ALT still 60  -5/16 AST and ALT  improved today  18: Thrombocytosis  -5/16 PLT down to 493, likely reactive  19. Dry skin: Eucerin ordered  20. Hypotension-   5/18- BP low 80s-90s systolic- on no BP meds- if dry, will give IVFs tomorrow- said not symptomatic- if stays low, might need midodrine.    I spent a total of 52    minutes on total care today- >50% coordination of care- due to going through meds and cleaning up- also complex medical issues- Hb, Leukocytosis, review of labs/vitals, and d/w nursing about Sx's of possible low BP   LOS: 11 days A FACE TO FACE EVALUATION WAS PERFORMED  Jesse [name] 03/29/2023, 3:03 PM

## 2023-03-29 NOTE — Progress Notes (Signed)
Patient ID: Jesse [name], male   DOB: Jan 02, 1961, 62 y.o.   MRN: [id number] Patient is status post debridement left heel necrotic ulcer.  There is 100 cc in the wound VAC canister.  There currently is a good suction fit.  Will discontinue the wound VAC dressing at time of discharge.  Tissues are showing multiple organisms with sensitivities pending.  Will need discharge on 3 to 4 weeks of antibiotic coverage.

## 2023-03-29 NOTE — Progress Notes (Addendum)
Per Provider noties patient is status post debriedment right heel ulcer. Outside dressing changed. Moderate serous drainage on the gauze and ACE wrapping. Scant serous drainage on the ABD at the ankle. At the toes above the wound vac dressing site gauze dressing as serous drainage size of a sand dollar. Elastic adhesive bandage has serous drainage on the plantar side of the foot. Serous drainage pooling at the top of the wound vac dressing. To touch scant amount of serous fluid on gloves during dressing change at the toe area. From assessment serous fluid draining from the toe area down the plantar side of patient's foot. Serous drainage as well collected in wound vac container at bedside. Reports no negative symptoms at this time. Calm and cooperative.

## 2023-03-30 LAB — BASIC METABOLIC PANEL
Anion gap: 8 (ref 5–15)
BUN: 40 mg/dL — ABNORMAL HIGH (ref 6–20)
CO2: 22 mmol/L (ref 22–32)
Calcium: 8.7 mg/dL — ABNORMAL LOW (ref 8.9–10.3)
Chloride: 105 mmol/L (ref 98–111)
Creatinine, Ser: 1.1 mg/dL (ref 0.61–1.24)
GFR, Estimated: >60 mL/min (ref >60)
Glucose, Bld: 166 mg/dL — ABNORMAL HIGH (ref 70–99)
Potassium: 4.2 mmol/L (ref 3.5–5.1)
Sodium: 135 mmol/L (ref 135–145)

## 2023-03-30 LAB — CBC WITH DIFFERENTIAL/PLATELET
Abs Immature Granulocytes: 0.04 10*3/uL (ref 0.00–0.07)
Basophils Absolute: 0.1 10*3/uL (ref 0.0–0.1)
Basophils Relative: 1 %
Eosinophils Absolute: 0.3 10*3/uL (ref 0.0–0.5)
Eosinophils Relative: 3 %
HCT: 21.9 % — ABNORMAL LOW (ref 39.0–52.0)
Hemoglobin: 7 g/dL — ABNORMAL LOW (ref 13.0–17.0)
Immature Granulocytes: 0 %
Lymphocytes Relative: 23 %
Lymphs Abs: 2.1 10*3/uL (ref 0.7–4.0)
MCH: 31.8 pg (ref 26.0–34.0)
MCHC: 32 g/dL (ref 30.0–36.0)
MCV: 99.5 fL (ref 80.0–100.0)
Monocytes Absolute: 1 10*3/uL (ref 0.1–1.0)
Monocytes Relative: 11 %
Neutro Abs: 5.5 10*3/uL (ref 1.7–7.7)
Neutrophils Relative %: 62 %
Platelets: 376 10*3/uL (ref 150–400)
RBC: 2.2 MIL/uL — ABNORMAL LOW (ref 4.22–5.81)
RDW: 14.4 % (ref 11.5–15.5)
WBC: 9 10*3/uL (ref 4.0–10.5)
nRBC: 0 % (ref 0.0–0.2)

## 2023-03-30 LAB — GLUCOSE, CAPILLARY
Glucose-Capillary: 139 mg/dL — ABNORMAL HIGH (ref 70–99)
Glucose-Capillary: 150 mg/dL — ABNORMAL HIGH (ref 70–99)
Glucose-Capillary: 124 mg/dL — ABNORMAL HIGH (ref 70–99)
Glucose-Capillary: 191 mg/dL — ABNORMAL HIGH (ref 70–99)

## 2023-03-30 MED ORDER — SODIUM CHLORIDE 0.9% IV SOLUTION: Freq: Once | INTRAVENOUS | Status: DC

## 2023-03-30 NOTE — Progress Notes (Signed)
Physical Therapy Session Note  Patient Details  Name: Jesse [name] MRN: [id number] Date of Birth: June 20, 1961  Today's Date: 03/30/2023 PT Individual Time: 1300-1416 PT Individual Time Calculation (min): 76 min   Short Term Goals: Week 2:  PT Short Term Goal 1 (Week 2): STG=LTG 2/2 ELOS  Skilled Therapeutic Interventions/Progress Updates:      Pt seated in WC upon arrival. Pt agreeable to therapy. Pt denies any pain.   Pt B LE in dependent position in WC. Education provided for pt to elevate leg rest on power wheelchair and rest on pillow to reduce risk of contracture.   Pt reports he plans to go to a SNF once a bed is available.  Pt propelled power wheelchair from room to main gym with mod I. Pt performed the following seated therex for UE/LE strengthening.   1x10 glute sets 1x10 long sitting SLR B 1x10 long sitting hip abduction B 1x10 quad sets B 1x10 LAQ B 1x15 ankle pumps R LE  1x20 bicep curls with 5# dowel rod   Pt lowered legs to dependent position and performed 1x15 wheelchair pushups, verbal cues provided to maintain B LE NWBing.   Pt performed slideboard transfer x2 wheelchair to mat table with supervision for positioning wheelchair next to mat table and for maintenance of NWBing precautions, pt able to place slidebaord safely.   Pt performed the following therex on mat table for UE/LE/core strengthening: 1x10 sidelying hip abduction bilaterally , 2x10 prone hip extension bilaterally.   Pt performed the following activities to improve obstacle negotiation and preparation for slide booard transfer Weaving in and out of cones 6 x 5 cones (with WC facing forward)  3 laps around semi circle of day room (backwards with verbal cues to look behind pt) X5 positioning wheelchair next to mat table to prepare for slideboard transfer, initially requiring verbal cues but progressing to able to do on own and verablize safe positioning vs unsafe.  Propulsion in small space of  room, with verabl cuing of being cautious of R LE and obstacle negotiation.   At end of session, Pt reports shrinker has not been changed in 2 days, and asking if it is time to change it. Discussed importance of changing every other day. Therapist checked and shrinker currently adhered to staples. Notified nursing of need to change shrinker with saline.   Pt seated in WC at end of session with all needs within reach.      Therapy Documentation Precautions:  Precautions Precautions: Fall Precaution Comments: L BKA, R foot wound, wound vac Required Braces or Orthoses: Other Brace Other Brace: L LE limb protector Restrictions Weight Bearing Restrictions: Yes RLE Weight Bearing: Non weight bearing LLE Weight Bearing: Non weight bearing   Therapy/Group: Individual Therapy  [facility] [place], [address], DPT  03/30/2023, 1:07 PM

## 2023-03-30 NOTE — Progress Notes (Signed)
PROGRESS NOTE   Subjective/Complaints:   Pt reports thinks they will remove VAC on Tuesday.  Pain better controlled now that taking pain meds more often however still shows Last dose was 5/17! Also hasn't taken Dilaudid IV. .  LBM 2 daysa go- doesn't feel constipated.   No complaints today.    Review of Systems  Constitutional:  Negative for fever.  HENT:  Negative for congestion.   Respiratory:  Negative for shortness of breath.   Cardiovascular:  Negative for chest pain.  Gastrointestinal:  Negative for abdominal pain, diarrhea, nausea and vomiting.  Genitourinary:  Negative for dysuria.  Musculoskeletal:  Positive for joint pain.  Neurological:  Positive for sensory change and weakness. Negative for dizziness, speech change and headaches.  Psychiatric/Behavioral:  Positive for depression.      Objective:   No results found. Recent Labs    03/28/23 1534 03/30/23 0532  WBC 12.6* 9.0  HGB 8.5* 7.0*  HCT 27.0* 21.9*  PLT 495* 376    Recent Labs    03/30/23 0532  NA 135  K 4.2  CL 105  CO2 22  GLUCOSE 166*  BUN 40*  CREATININE 1.10  CALCIUM 8.7*     Intake/Output Summary (Last 24 hours) at 03/30/2023 1032 Last data filed at 03/30/2023 0954 Gross per 24 hour  Intake 236 ml  Output 1500 ml  Net -1264 ml         Physical Exam: Vital Signs Blood pressure 100/64, pulse 86, temperature (!) 97.5 F (36.4 C), temperature source Oral, resp. rate 20, height 6' (1.829 m), weight 89 kg, SpO2 99 %.  Physical Exam: Blood pressure 117/66, pulse 75, temperature 98.2 F (36.8 C), temperature source Oral, resp. rate 18, height 6' (1.829 m), weight 96.3 kg, SpO2 99 %.   General: awake, alert, appropriate, sitting up in bed; nursing giving insulin/lovenox; NAD HENT: conjugate gaze; oropharynx moist CV: regular rate and rhythm; no JVD Pulmonary: CTA B/L; no W/R/R- good air movement GI: soft, NT, ND, (+)BS-  normoactive Psychiatric: appropriate- less pain per pt Neurological: Ox3  Extremities: Right heel ulcer with wound VAC in place  with ~ 1/3 full VAC cannister- dark blood colored; and ACE wrap overtop- rewarpped ACE wrap left BKA with shrinker in place Psych: Pt's affect is appropriate. Pt is cooperative  Musculoskeletal:     Right lower leg: Edema present.     Comments:  Wound vac in place to R heel, serosangeonous drainage noted, VAC does not appear to be suctioning correctly . Wearing limb protector on left lower extremity.  Neurological:     General: No focal deficit present.     Mental Status: He is alert and oriented to person, place, and time.     Comments: Alert and oriented x 3. Normal insight and awareness. Intact Memory. Normal language and speech. Cranial nerve exam unremarkable. MMT: 5/5 BLE, RLE grossly 4/5 prox to distal with some limitations distally. LLE limited by vac but grossly 3-4/5. Decreased LT RLE below mid calf.  Psychiatric:        Mood and Affect: Appropriate, normal affect        Assessment/Plan: 1. Functional deficits which require 3+  hours per day of interdisciplinary therapy in a comprehensive inpatient rehab setting. Physiatrist is providing close team supervision and 24 hour management of active medical problems listed below. Physiatrist and rehab team continue to assess barriers to discharge/monitor patient progress toward functional and medical goals  Care Tool:  Bathing    Body parts bathed by patient: Right arm, Left arm, Chest, Abdomen, Front perineal area, Right upper leg, Left upper leg, Right lower leg, Face, Buttocks   Body parts bathed by helper: Buttocks Body parts n/a: Left lower leg (BKA)   Bathing assist Assist Level: Supervision/Verbal cueing     Upper Body Dressing/Undressing Upper body dressing   What is the patient wearing?: Pull over shirt    Upper body assist Assist Level: Supervision/Verbal cueing    Lower Body  Dressing/Undressing Lower body dressing      What is the patient wearing?: Pants     Lower body assist Assist for lower body dressing: Minimal Assistance - Patient > 75%     Toileting Toileting    Toileting assist Assist for toileting: Moderate Assistance - Patient 50 - 74%     Transfers Chair/bed transfer  Transfers assist  Chair/bed transfer activity did not occur: N/A  Chair/bed transfer assist level: Supervision/Verbal cueing (SB)     Locomotion Ambulation   Ambulation assist   Ambulation activity did not occur: Safety/medical concerns (pt refused due to R LE pain/wounds)          Walk 10 feet activity   Assist  Walk 10 feet activity did not occur: Safety/medical concerns        Walk 50 feet activity   Assist Walk 50 feet with 2 turns activity did not occur: Safety/medical concerns         Walk 150 feet activity   Assist Walk 150 feet activity did not occur: Safety/medical concerns         Walk 10 feet on uneven surface  activity   Assist Walk 10 feet on uneven surfaces activity did not occur: Safety/medical concerns         Wheelchair     Assist Is the patient using a wheelchair?: Yes Type of Wheelchair: Manual    Wheelchair assist level: Supervision/Verbal cueing      Wheelchair 50 feet with 2 turns activity    Assist        Assist Level: Supervision/Verbal cueing   Wheelchair 150 feet activity     Assist      Assist Level: Supervision/Verbal cueing   Blood pressure 100/64, pulse 86, temperature (!) 97.5 F (36.4 C), temperature source Oral, resp. rate 20, height 6' (1.829 m), weight 89 kg, SpO2 99 %.  Medical Problem List and Plan: 1. 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  However discussed with patient I think he would have very difficult time caring for himself at his apartment, unlikely to be  able to care for his wounds effectively.  Discussed concerns that if wounds are not taking care of this could increase the risk of additional surgeries and amputations.  Discussed looking into SNF, patient says he will consider and let us know later today.  Will likely need to delay his discharge until early next week due to upcoming surgery.  -Pt agreeable to SNF  Con't CIR PT and OT wound VAC for now for R foot wound- VAC 1/3 full with sanguinous drainage- will check CBC tomorrow to make sure Hb stable.  2.  Impaired mobility: continue Lovenox             -antiplatelet therapy: Aspirin and Plavix    3. Pain: Tylenol, oxycodone as needed             -continue gabapentin 100 mg TID             -pt reports reasonable pain control at present  5/18- pt reports pain is really bad and only taking meds ~ 1x/day- asked him to ask for it more frequently.  4. Mood/Behavior/Sleep: LCSW to evaluate and provide emotional support             -antipsychotic agents: n/a   5. Neuropsych/cognition: This patient is capable of making decisions on his own behalf.   6. Wound vac:              -Prevena vac comes off on 5/11---placed order for removal, residual limb examined and healing well             -right heel ulcer; off-load and continue daily dressing changes  -Dicussed with Dr. Lajoyce Corners 5/9, ordered ARAFO to wear at all times ok to WB in [facility] otherwise NWB  -He continues to have drainage from his right foot wound, drainage with strong odor, last Dr. Lajoyce Corners for recommendations regarding wound management  -Debridement completed  5/17 by Dr. Lajoyce Corners, nursing to call [facility] to eval wound vac  5/18- odor much better- VAC on R foot- s/p debridement- labs Monday to make sure Hb is stable   5/19- Hb down to 7.0- will transfuse 1 unit pRBCs since appears to be actively bleeding- however, went to try and encourage pt to do- he says he doesn't feel dizzy so doesn't want  blood at this time- went over the full risks/benefits and he said would re-eval tomorrow AM if it's lower. Has received Plavix/Lovenox and ASA today- will hold /stop for tomorrow AM for now- will all need to be restarted when safe. Pt DID sign consent, but doesn't want blood right now.  7. Fluids/Electrolytes/Nutrition: Routine Is and Os and follow-up chemistries             -continue MVI and Vit C, zinc supplementation             -continue Juven   9: Hyperlipidemia: -Hold statin for a few days, recheck Monday -5/14 statin has been restarted   10: DM-2: CBGs QID; carb modified diet             -continue Semglee 5 units at bedtime  D/c bedtime ISS given excellent control and since patient will not have this at home  -5/17 controlled overall, CBGs lower than usual today due to recent NPO, diet to be resumed   5/18- BG's 120s-150s- con't regimen  5/19- CBGs 130s-160s- con't regimen  CBG (last 3)  Recent Labs    03/29/23 1625 03/29/23 2118 03/30/23 0642  GLUCAP 163* 139* 139*  11: Hyponatremia: trend/follow-up BMP -5/8 Na stable 133 -5/16 sodium stable at 136      Latest Ref Rng & Units 03/30/2023    5:32 AM 03/27/2023    6:58 AM 03/24/2023    7:47 AM  BMP  Glucose 70 - 99 mg/dL 161  096  045   BUN 6 - 20 mg/dL 40  35  27   Creatinine 0.61 - 1.24 mg/dL 4.09  8.11  9.14   Sodium 135 - 145 mmol/L 135  136  137   Potassium 3.5 - 5.1 mmol/L 4.2  4.4  4.4   Chloride 98 - 111 mmol/L 105  105  106   CO2 22 - 32 mmol/L 22  23  22    Calcium 8.9 - 10.3 mg/dL 8.7  9.2  9.1       12: Elevated BUN: follow-up BMP             -encourage fluids  -5/8 BUN  stable 25, continue to encourage fluid intake  -5/13 Cr down to 1.07  5/19- BUN 40- pt doesn't want IVFs- will drink-   13: Leukocytosis: downward trend; follow-up CBC  -5/16 WBC stable at 9.2  5/17- WBC 12.6- is afebrile- and s/p debridement- will recheck tomorrow 14: ABLA Anemia: multifactorial/acute blood loss: follow-up CBC  -5/8 HGB stable at 8.9  -5/16 Hgb stable at 9.2  Recheck Monday unless has continued significant bleeding    5.18- Hb 8.5- will recheck in AM  5/19- Hb 7.0 today- pt refusing IVFs or Blood at this time-doesn't feel bad/dizzy, so doesn't want blood.  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 Lajoyce Corners on 4/07 to have legs wrapped with Dynaflex   16 Right Eye irritation  -continue Lubricating drops ordered Q4h prn  17.  Elevated LFTs -Hold statin for a few days, decrease Tylenol to every 6 hours as needed,  -AST down to 33, ALT still 60  -5/16 AST and ALT improved today  18: Thrombocytosis  -5/16 PLT down to 493, likely reactive  5/19- Plts down to 376k 19. Dry skin: Eucerin ordered  20. Hypotension-   5/18- BP low 80s-90s systolic- on no BP meds- if dry, will give IVFs tomorrow- said not symptomatic- if stays low, might need midodrine.   5/19- BP doing better- 100s-110s today. Didn't get dizzy/lightheade din therapy.    I spent a total of 58    minutes on total care today- >50% coordination of care- due to d/w staff about Hb of 7.0- pt seen 2x second time to discuss getting blood- he doesn't want today- might think about getting tomorrow, so held Plavix, ASA 81 mg and Lovenox.       LOS: 12 days A FACE TO FACE EVALUATION WAS PERFORMED  Avika Carbine 03/30/2023, 10:32 AM

## 2023-03-30 NOTE — Progress Notes (Signed)
Physical Therapy Session Note  Patient Details  Name: Jesse [name] MRN: [id number] Date of Birth: [birth year]  Today's Date: 03/30/2023 PT Individual Time: 0900-0945 PT Individual Time Calculation (min): 45 min   Short Term Goals: Week 2:  PT Short Term Goal 1 (Week 2): STG=LTG 2/2 ELOS  Skilled Therapeutic Interventions/Progress Updates:   Pt received in room lying supine in bed. No complaints of pain. Pt agreeable to PT.   Therex: Pt states he will do exercises lying in bed. 3 lb dowel chest press x 20/bicep curl x 20/tricep press x 20. 2 lb dowel shoulder flex/ext to 90 degrees. Bil shoulder ext with elbows flex/ext x 20 each. Glute set x 20; L QS x 20, SLR x 20, hip abduction/adduction SLR combo x 20; R QS x 20, SLR x 20, hip abduction/adduction SLR combo x 7. Pt requested the latter to be discontinued due to pulling in R knee. Pt without complaints of pain with therex. Quality of movement concentrated on all.   Pt left in bed post tx, bed alarm on, cell phone and call light within reach. No reports of pain.     Therapy Documentation Precautions:  Precautions Precautions: Fall Precaution Comments: L BKA, R foot wound, wound vac Required Braces or Orthoses: Other Brace Other Brace: L LE limb protector Restrictions Weight Bearing Restrictions: Yes RLE Weight Bearing: Non weight bearing LLE Weight Bearing: Non weight bearing     Therapy/Group: Individual Therapy  Luna Fuse 03/30/2023, 12:14 PM

## 2023-03-31 ENCOUNTER — Encounter (HOSPITAL_BASED_OUTPATIENT_CLINIC_OR_DEPARTMENT_OTHER): Payer: Medicaid Other | Admitting: Internal Medicine

## 2023-03-31 DIAGNOSIS — I959 Hypotension, unspecified: Secondary | ICD-10-CM

## 2023-03-31 DIAGNOSIS — K5900 Constipation, unspecified: Secondary | ICD-10-CM

## 2023-03-31 LAB — COMPREHENSIVE METABOLIC PANEL
ALT: 37 U/L (ref 0–44)
AST: 29 U/L (ref 15–41)
Albumin: 2.5 g/dL — ABNORMAL LOW (ref 3.5–5.0)
Alkaline Phosphatase: 118 U/L (ref 38–126)
Anion gap: 9 (ref 5–15)
BUN: 33 mg/dL — ABNORMAL HIGH (ref 6–20)
CO2: 22 mmol/L (ref 22–32)
Calcium: 9.3 mg/dL (ref 8.9–10.3)
Chloride: 106 mmol/L (ref 98–111)
Creatinine, Ser: 0.94 mg/dL (ref 0.61–1.24)
GFR, Estimated: >60 mL/min (ref >60)
Glucose, Bld: 193 mg/dL — ABNORMAL HIGH (ref 70–99)
Potassium: 4.3 mmol/L (ref 3.5–5.1)
Sodium: 137 mmol/L (ref 135–145)
Total Bilirubin: 0.2 mg/dL — ABNORMAL LOW (ref 0.3–1.2)
Total Protein: 8.2 g/dL — ABNORMAL HIGH (ref 6.5–8.1)

## 2023-03-31 LAB — CBC
HCT: 26.3 % — ABNORMAL LOW (ref 39.0–52.0)
Hemoglobin: 8.1 g/dL — ABNORMAL LOW (ref 13.0–17.0)
MCH: 31.5 pg (ref 26.0–34.0)
MCHC: 30.8 g/dL (ref 30.0–36.0)
MCV: 102.3 fL — ABNORMAL HIGH (ref 80.0–100.0)
Platelets: 400 10*3/uL (ref 150–400)
RBC: 2.57 MIL/uL — ABNORMAL LOW (ref 4.22–5.81)
RDW: 14.5 % (ref 11.5–15.5)
WBC: 10.3 10*3/uL (ref 4.0–10.5)
nRBC: 0 % (ref 0.0–0.2)

## 2023-03-31 LAB — GLUCOSE, CAPILLARY
Glucose-Capillary: 137 mg/dL — ABNORMAL HIGH (ref 70–99)
Glucose-Capillary: 153 mg/dL — ABNORMAL HIGH (ref 70–99)
Glucose-Capillary: 129 mg/dL — ABNORMAL HIGH (ref 70–99)
Glucose-Capillary: 162 mg/dL — ABNORMAL HIGH (ref 70–99)

## 2023-03-31 LAB — AEROBIC/ANAEROBIC CULTURE W GRAM STAIN (SURGICAL/DEEP WOUND)

## 2023-03-31 LAB — MAGNESIUM: Magnesium: 1.6 mg/dL — ABNORMAL LOW (ref 1.7–2.4)

## 2023-03-31 MED ORDER — MAGNESIUM CHLORIDE 64 MG PO TBEC
1.0000 | DELAYED_RELEASE_TABLET | Freq: Two times a day (BID) | ORAL | Status: DC
  Administered 2023-03-31 – 2023-04-02 (×4): 64 mg via ORAL
  Filled 2023-03-31 (×5): qty 1

## 2023-03-31 MED ORDER — SORBITOL 70 % SOLN
30.0000 mL | Freq: Once | Status: AC
  Administered 2023-03-31: 30 mL via ORAL
  Filled 2023-03-31: qty 30

## 2023-03-31 NOTE — Progress Notes (Addendum)
PROGRESS NOTE   Subjective/Complaints:   Pt declined  labs this AM. After I spoke with him this AM he agreed. He says he would consider transfusion if still needed after labs.   No complaints today.    Review of Systems  Constitutional:  Negative for fever and malaise/fatigue.  HENT:  Negative for congestion.   Eyes:  Negative for double vision.  Respiratory:  Negative for shortness of breath.   Cardiovascular:  Negative for chest pain.  Gastrointestinal:  Negative for abdominal pain, diarrhea, nausea and vomiting.  Genitourinary:  Negative for dysuria.  Musculoskeletal:  Positive for joint pain.  Skin:  Negative for rash.  Neurological:  Positive for sensory change and weakness. Negative for tingling and speech change.  Psychiatric/Behavioral:  Positive for depression.      Objective:   No results found. Recent Labs    03/28/23 1534 03/30/23 0532  WBC 12.6* 9.0  HGB 8.5* 7.0*  HCT 27.0* 21.9*  PLT 495* 376    Recent Labs    03/30/23 0532  NA 135  K 4.2  CL 105  CO2 22  GLUCOSE 166*  BUN 40*  CREATININE 1.10  CALCIUM 8.7*     Intake/Output Summary (Last 24 hours) at 03/31/2023 0813 Last data filed at 03/31/2023 0742 Gross per 24 hour  Intake 836 ml  Output 700 ml  Net 136 ml         Physical Exam: Vital Signs Blood pressure 123/69, pulse 79, temperature 98.5 F (36.9 C), temperature source Oral, resp. rate 18, height 6' (1.829 m), weight 89 kg, SpO2 100 %.  Physical Exam: Blood pressure 117/66, pulse 75, temperature 98.2 F (36.8 C), temperature source Oral, resp. rate 18, height 6' (1.829 m), weight 96.3 kg, SpO2 99 %.   General: awake, alert, appropriate, sitting on commode; NAD HENT: conjugate gaze; oropharynx moist CV: regular rate and rhythm; no JVD Pulmonary: CTA B/L; no W/R/R- no increased WOBC GI: soft, NT, ND, (+)BS- normoactive Psychiatric: appropriate,  pleasant Neurological: Ox3  Extremities: Right heel ulcer with wound VAC in place  with ~ 1/3 full VAC cannister- dark blood colored; and ACE wrap overtop Psych: Pt's affect is appropriate. Pt is cooperative  Musculoskeletal:     Right lower leg: Edema present.     Comments:  Wound vac in place to R heel, serosangeonous drainage noted, VAC does not appear to be suctioning correctly . Wearing limb protector on left lower extremity.  Neurological:     General: No focal deficit present.     Mental Status: He is alert and oriented to person, place, and time.     Comments: Alert and oriented x 3. Normal insight and awareness. Intact Memory. Normal language and speech. Cranial nerve exam unremarkable. MMT: 5/5 BLE, RLE grossly 4/5 prox to distal with some limitations distally. LLE limited by vac but grossly 3-4/5. Decreased LT RLE below mid calf.  Psychiatric:        Mood and Affect: Appropriate, normal affect        Assessment/Plan: 1. Functional deficits which require 3+ hours per day of interdisciplinary therapy in a comprehensive inpatient rehab setting. Physiatrist is providing close team supervision  and 24 hour management of active medical problems listed below. Physiatrist and rehab team continue to assess barriers to discharge/monitor patient progress toward functional and medical goals  Care Tool:  Bathing    Body parts bathed by patient: Right arm, Left arm, Chest, Abdomen, Front perineal area, Right upper leg, Left upper leg, Right lower leg, Face, Buttocks   Body parts bathed by helper: Buttocks Body parts n/a: Left lower leg (BKA)   Bathing assist Assist Level: Supervision/Verbal cueing     Upper Body Dressing/Undressing Upper body dressing   What is the patient wearing?: Pull over shirt    Upper body assist Assist Level: Supervision/Verbal cueing    Lower Body Dressing/Undressing Lower body dressing      What is the patient wearing?: Pants     Lower body  assist Assist for lower body dressing: Minimal Assistance - Patient > 75%     Toileting Toileting    Toileting assist Assist for toileting: Moderate Assistance - Patient 50 - 74%     Transfers Chair/bed transfer  Transfers assist  Chair/bed transfer activity did not occur: N/A  Chair/bed transfer assist level: Supervision/Verbal cueing (SB)     Locomotion Ambulation   Ambulation assist   Ambulation activity did not occur: Safety/medical concerns (pt refused due to R LE pain/wounds)          Walk 10 feet activity   Assist  Walk 10 feet activity did not occur: Safety/medical concerns        Walk 50 feet activity   Assist Walk 50 feet with 2 turns activity did not occur: Safety/medical concerns         Walk 150 feet activity   Assist Walk 150 feet activity did not occur: Safety/medical concerns         Walk 10 feet on uneven surface  activity   Assist Walk 10 feet on uneven surfaces activity did not occur: Safety/medical concerns         Wheelchair     Assist Is the patient using a wheelchair?: Yes Type of Wheelchair: Manual    Wheelchair assist level: Supervision/Verbal cueing      Wheelchair 50 feet with 2 turns activity    Assist        Assist Level: Supervision/Verbal cueing   Wheelchair 150 feet activity     Assist      Assist Level: Supervision/Verbal cueing   Blood pressure 123/69, pulse 79, temperature 98.5 F (36.9 C), temperature source Oral, resp. rate 18, height 6' (1.829 m), weight 89 kg, SpO2 100 %.  Medical Problem List and Plan: 1. 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  Impaired mobility: continue Lovenox             -antiplatelet therapy: Aspirin and Plavix    3. Pain: Tylenol, oxycodone as needed             -  continue gabapentin 100 mg TID             -pt reports reasonable pain control at present  5/18- pt reports pain is really bad and only taking meds ~ 1x/day- asked him to ask for it more frequently.  4. Mood/Behavior/Sleep: LCSW to evaluate and provide emotional support             -antipsychotic agents: n/a   5. Neuropsych/cognition: This patient is capable of making decisions on his own behalf.   6. Wound vac:              -Prevena vac comes off on 5/11---placed order for removal, residual limb examined and healing well             -right heel ulcer; off-load and continue daily dressing changes  -Dicussed with Dr. Lajoyce Corners 5/9, ordered ARAFO to wear at all times ok to WB in [facility], otherwise NWB  -He continues to have drainage from his right foot wound, drainage with strong odor, last Dr. Lajoyce Corners for recommendations regarding wound management  -Debridement completed 5/17 by Dr. Lajoyce Corners, nursing to call [facility] to eval wound vac  5/18- odor much better- VAC on R foot- s/p debridement- labs Monday to make sure Hb is stable   5/19- Hb down to 7.0- will transfuse 1 unit pRBCs since appears to be actively bleeding- however, went to try and encourage pt to do- he says he doesn't feel dizzy so doesn't want  blood at this time- went over the full risks/benefits and he said would re-eval tomorrow AM if it's lower. Has received Plavix/Lovenox and ASA today- will hold /stop for tomorrow AM for now- will all need to be restarted when safe. Pt DID sign consent, but doesn't want blood right now.   5/20 does not appear to have much more drainage in VAC today 7. Fluids/Electrolytes/Nutrition: Routine Is and Os and  follow-up chemistries             -continue MVI and Vit C, zinc supplementation             -continue Juven   9: Hyperlipidemia: -Hold statin for a few days, recheck Monday -5/14 statin has been restarted   10: DM-2: CBGs QID; carb modified diet             -continue Semglee 5 units at bedtime  D/c bedtime ISS given excellent control and since patient will not have this at home  -5/17 controlled overall, CBGs lower than usual today due to recent NPO, diet to be resumed   5/18- BG's 120s-150s- con't regimen  5/20 Controlled overalll, continue current  CBG (last 3)  Recent Labs    03/30/23 1711 03/30/23 2043 03/31/23 0615  GLUCAP 124* 191* 137*       11: Hyponatremia: trend/follow-up BMP -5/8 Na stable 133 -5/16 sodium stable at 136      Latest Ref Rng & Units 03/30/2023    5:32 AM 03/27/2023    6:58 AM 03/24/2023    7:47 AM  BMP  Glucose 70 - 99 mg/dL 161  096  045   BUN 6 - 20 mg/dL 40  35  27   Creatinine 0.61 - 1.24 mg/dL 4.09  8.11  9.14   Sodium 135 - 145 mmol/L 135  136  137   Potassium 3.5 - 5.1 mmol/L 4.2  4.4  4.4   Chloride 98 - 111 mmol/L 105  105  106   CO2 22 - 32 mmol/L  22  23  22    Calcium 8.9 - 10.3 mg/dL 8.7  9.2  9.1       12: Elevated BUN: follow-up BMP             -encourage fluids  -5/8 BUN stable 25, continue to encourage fluid intake  -5/13 Cr down to 1.07  5/19- BUN 40- pt doesn't want IVFs- will drink-   Recheck today  13: Leukocytosis: downward trend; follow-up CBC  -5/16 WBC stable at 9.2  5/17- WBC 12.6- is afebrile- and s/p debridement- will recheck tomorrow 14: ABLA Anemia: multifactorial/acute blood loss: follow-up CBC  -5/8 HGB stable at 8.9  -5/16 Hgb stable at 9.2  Recheck Monday unless has continued significant bleeding    5.18- Hb 8.5- will recheck in AM  5/19- Hb 7.0 today- pt refusing IVFs or Blood at this time-doesn't feel bad/dizzy, so doesn't want blood.    5/20- Declined labs in AM but agreed to do this now. He says  he would consider transfusion. 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 Lajoyce Corners on 4/07 to have legs wrapped with Dynaflex   16 Right Eye irritation  -continue Lubricating drops ordered Q4h prn  17.  Elevated LFTs -Hold statin for a few days, decrease Tylenol to every 6 hours as needed,  -AST down to 33, ALT still 60  -5/16 AST and ALT improved today -Recheck today  18: Thrombocytosis  -5/16 PLT down to 493, likely reactive  5/19- Plts down to 376k 19. Dry skin: Eucerin ordered  20. Hypotension-   5/18- BP low 80s-90s systolic- on no BP meds- if dry, will give IVFs tomorrow- said not symptomatic- if stays low, might need midodrine.   5/19- BP doing better- 100s-110s today. Didn't get dizzy/lightheade din therapy.   5/20 vitals stable this AM     03/31/2023    4:04 AM 03/30/2023    7:30 PM 03/30/2023    4:07 AM  Vitals with BMI  Systolic 123 104 454   110  Diastolic 69 61 64   64  Pulse 79 86 86   86    20. Constipation  -sorbitol 30mg  ordered  21. Low mg -Slow mg started   LOS: 13 days A FACE TO FACE EVALUATION WAS PERFORMED  Fanny Dance 03/31/2023, 8:13 AM

## 2023-03-31 NOTE — Progress Notes (Signed)
Physical Therapy Session Note  Patient Details  Name: Jesse [name] MRN: [id number] Date of Birth: 1961-10-20  Today's Date: 03/31/2023 PT Individual Time: 4098-1191, 4782-9562 PT Individual Time Calculation (min): 57 min, 71   Short Term Goals: Week 2:  PT Short Term Goal 1 (Week 2): STG=LTG 2/2 ELOS  Skilled Therapeutic Interventions/Progress Updates:      Treatment Session 1  Pt received seated in power wheelchair with B LE in dependent position with nurse in room. Pt agreeable to therapy. Pt denies any pain.   Therapist followed up to determine if shrinker was changed yesterday after session. Pt reports it was not. Therapist doffed shrinker with mod A assist of nurse and saline solution as pt shrinker is adhered to staples.   Pt donned shrinker with supervision, as pt asking for verification of proper technique throughout. Pt required reminder of need to wear L LE limb protector when OOB. Pt donned L LE limb protector with mod I and increased time. Pt navigated obstacles in room with power WC (multidirectional navigation) to grab shrinker and clean it in sink and hang it in bathroom to dry, supervision for obstacle navigation, education provided to lower B LE when navigating tight spaces to avoid hitting R LE on objects. Pt cleaned shrinker I sink with supervision. Pt operating controls of power wheelchair (turning on and off and raising/lowering legs) independently.   Discussed importance of elevating L LE when resting for contracture prevention and healing. Pt able to verablize teach back of when resting in WC legs should be elevated on leg rests and pillow, but lowered when performing slide board transfer and navigating tight spaces to avoid injury to R LE. Pt able to verbalize NWBing precautions.   Pt propelled power wheelchair with mod I from room to day room. Pt positioned wheelchair against mat table with supervision, therapist assessed pt recall of lowering LE leg rests for  slideboard transfer, pt able to recall with minimal questioning cuing. Reiterated positioning needed for slideboard transfer, pt then positioned wheelchair next to mat table with mod I, and performed slide board transfer with clean up assist only as pt was able to grab pt slide board out of bag on back of wheelchair and position it safely for transfer, but unable to return it to back of wheelchair. Will assess during afternoon session, pt ability to recall all steps without cues.   Pt seated in WC at end of session with all needs within reach.   Treatment Session 2   Pt seated in power WC upon arrival with legs in dependent position. Therapist provided questioning cue for proper positioning of LE when resting in WC. Pt adjust leg rests to elevated position with mod I.  Pt propelled power WC to day room with mod I. Pt positioned wheelchair, managed wheelchair parts (leg rest and arm rests) and placed slideboard and performed transfer with clean up assist.   Pt propelled power WC from day room 1st floor outside. Pt practicing adjusting speed as appropriate, speeding up in less busy enviornments and slowing down in condensed spaces, intermittent minimal cuing provided as needed. Pt navigated elevator x2  with supervision and increased time, education provided to lower leg rest prior to entering elevator to safely navigate small space, education provided to raise legs again once out of elevator. Pt demos recall of cues on 2nd trial.   Pt performed mass practice of navigated power wheelchair backwards with obstacles on both sides, verbal cues provided for looking to both  sides for safety as pt initially bumping into objects. Pt utilized trial and error and verabal cues to improve proficiency. Pt able to complete 3 circles backwards safely without hitting any objects with mod I.   Pt completed 2x10 of the following exercises in power wheelchair for B LE/core strengthening    Long sitting SLR  Long sitting  hip abduction   R LE ankle pumps  LAQ unilaterally, and LAQ simulatenously bilaterally for core activation  Modified sit up   Glute sets  Wheelchair pushups  Alternating seated marching  Pt seated in WC at end of session with all needs within reach.          Therapy Documentation Precautions:  Precautions Precautions: Fall Precaution Comments: L BKA, R foot wound, wound vac Required Braces or Orthoses: Other Brace Other Brace: L LE limb protector Restrictions Weight Bearing Restrictions: Yes RLE Weight Bearing: Non weight bearing LLE Weight Bearing: Non weight bearing   Therapy/Group: Individual Therapy  [facility] Ambrose Finland, [place], DPT  03/31/2023, 7:34 AM

## 2023-03-31 NOTE — Progress Notes (Signed)
Occupational Therapy Session Note  Patient Details  Name: Jesse [name] MRN: [id number] Date of Birth: 10-29-1961  Today's Date: 03/31/2023 OT Individual Time: 4098-1191 OT Individual Time Calculation (min): 54 min    Short Term Goals: Week 2:  OT Short Term Goal 1 (Week 2): STG = LTGS (d/t ELOS)  Skilled Therapeutic Interventions/Progress Updates:    Pt received sitting in the power w/c with no c/o pain, agreeable to OT session. Lab entered initially for blood draw. He was able to navigate the w/c to the therapy gym with no additional cueing- good management of doorways, hallway obstacles and appropriate speed use. He placed the slideboard and completed a lateral scoot with (S). He completed 3x15 modified sit ups to address core stability necessary to complete wide lateral leans on the [facility] for hygiene/LB dressing. He held a 4lb dumbbell to add resistance to increase activity intensity. He then completed 2 more lateral lean activities with unilateral functional reach overhead and laterally to simulate more toileting tasks. He completed 6x20 repetitions. Rest breaks provided intermittently. He then completed functional reaching toward the ground to address foot care at home. He returned to his w/c and to his room. He was left sitting up with all needs met. Wound vac plugged in.   Therapy Documentation Precautions:  Precautions Precautions: Fall Precaution Comments: L BKA, R foot wound, wound vac Required Braces or Orthoses: Other Brace Other Brace: L LE limb protector Restrictions Weight Bearing Restrictions: Yes RLE Weight Bearing: Non weight bearing LLE Weight Bearing: Non weight bearing  Therapy/Group: Individual Therapy  Crissie Reese 03/31/2023, 6:27 AM

## 2023-03-31 NOTE — Progress Notes (Signed)
Patient ID: Jesse [name], male   DOB: 1961-09-03, 62 y.o.   MRN: [id number]  Met with pt to inform him he has one bed offer on [place] and one in [place]. He has chosen [facility] which is [place]. Have contacted Brianne at [facility] to let know has accepted bed and she will begin insurance auth. Pt feels doing well and hopes Dr Lajoyce Corners will come this week before he leaves and checks his foot before he leaves. He is aware he will go with his manual wheelchair and not have the power chair he is using here, could get one once discharged from facility

## 2023-03-31 NOTE — Progress Notes (Signed)
Pt declined his am labs this morning.

## 2023-03-31 NOTE — Anesthesia Postprocedure Evaluation (Signed)
Anesthesia Post Note  Patient: Jesse [name]  Procedure(s) Performed: RIGHT HEEL DEBRIDEMENT (Right)     Patient location during evaluation: PACU Anesthesia Type: MAC Level of consciousness: awake Pain management: pain level controlled Vital Signs Assessment: post-procedure vital signs reviewed and stable Respiratory status: spontaneous breathing, nonlabored ventilation and respiratory function stable Cardiovascular status: blood pressure returned to baseline and stable Postop Assessment: no apparent nausea or vomiting Anesthetic complications: no   No notable events documented.  Last Vitals:  Vitals:   03/31/23 0404 03/31/23 1536  BP: 123/69 112/66  Pulse: 79 84  Resp: 18 18  Temp: 36.9 C 37.2 C  SpO2: 100% 100%    Last Pain:  Vitals:   03/31/23 1700  TempSrc:   PainSc: 0-No pain   Pain Goal: Patients Stated Pain Goal: 0 (03/21/23 0759)                 Catheryn Bacon Dorri Ozturk

## 2023-03-31 NOTE — Progress Notes (Signed)
Occupational Therapy Session Note  Patient Details  Name: Jesse [name] MRN: [id number] Date of Birth: [birth year]  Today's Date: 03/31/2023 OT Individual Time: 1003-1030 OT Individual Time Calculation (min): 27 min    Short Term Goals: Week 2:  OT Short Term Goal 1 (Week 2): STG = LTGS (d/t ELOS)  Skilled Therapeutic Interventions/Progress Updates:    Pt greeted sitting in power chair and agreeable to OT treatment session. Pt with question regarding where [facility] center was located. OT pulled up location on computer to show pt and help orient him. Pt then completed UB there-ex using 5 lb weighted bar. 3 sets of 10 chest press, bicep curl, and straight arm raise. Pt left seated in power chair with needs met.   Therapy Documentation Precautions:  Precautions Precautions: Fall Precaution Comments: L BKA, R foot wound, wound vac Required Braces or Orthoses: Other Brace Other Brace: L LE limb protector Restrictions Weight Bearing Restrictions: Yes RLE Weight Bearing: Non weight bearing LLE Weight Bearing: Non weight bearing Pain:  5/10 R LE, rest and repositioned  Therapy/Group: Individual Therapy  Mal Amabile 03/31/2023, 10:25 AM

## 2023-04-01 LAB — GLUCOSE, CAPILLARY
Glucose-Capillary: 117 mg/dL — ABNORMAL HIGH (ref 70–99)
Glucose-Capillary: 145 mg/dL — ABNORMAL HIGH (ref 70–99)
Glucose-Capillary: 159 mg/dL — ABNORMAL HIGH (ref 70–99)
Glucose-Capillary: 187 mg/dL — ABNORMAL HIGH (ref 70–99)

## 2023-04-01 NOTE — Progress Notes (Signed)
Physical Therapy Session Note  Patient Details  Name: Jesse [name] MRN: [id number] Date of Birth: 1961-06-25  Today's Date: 04/01/2023 PT Individual Time: 4098-1191, 4782-9562 PT Individual Time Calculation (min): 60 min, 28 min   and Today's Date: 04/01/2023 PT Missed Time: 15 Minutes Missed Time Reason: Other (Comment) (eating)  Short Term Goals: Week 2:  PT Short Term Goal 1 (Week 2): STG=LTG 2/2 ELOS  Skilled Therapeutic Interventions/Progress Updates:      Treatment Session 1  Pt seated EOB upon arrival. Pt reports Dr. Lajoyce Corners just changed the dressing and removed the wound vac on his R LE. Pt recalls conversation from Dr. Lajoyce Corners that he is still not able to put any weight on R leg. Pt requesting to eat breakfast. Therapist provided pt with tray. Pt missed 15 minutes to eat breakfast. Pt agreeable to session upon therapist return. Pt denies any pain.   Pt donned L LE limb protector with mod I. Pt performed slide board bed to WC with mod I.  Pt requires verbal questioning cue for reminder to elevate leg rests on power wheelchair.   Pt performed slideboard transfer to car with mod I. Pt performed slideboard transfer power wheelchair to mat table with mod I.   Pt performed the following therex on mat table to increase UE/LE/core strenghtening:   2x10 glute bridges with B UE on bolster 2x10 prone hip extension bilaterally  2x10 sidelying hip abduction bilaterally 2x10 modified crunches with 3# medicine ball 2x10 russian twists with 3# medicine ball 2x10 LAQ bilaterally 2x10 supine SLR bilaterally  2x10 supine SLR bilaterally simultaneously for core activation   Pt performed slideboard transfer mod I mat table<>WC.   MD present for daily rounds. Social work present to notify pt of discharge tomorrow to SNF in [place].   Treatment Session 2   Pt seated in WC upon arrival with B LE in dependent position. Pt reports 8/10 pain in R LE, pt reports mild pain relief with dependent  position versus elevated position. Pt reports this pain is new since woundvac removal. Notified nursing. Pt performed dishcarge components. Therapist reviewed and provided handout for HEP. Therapist made written note on supine active straight leg raise to keep both legs straight, and for wheelchair pushup and alternating marching to keep legs off of floor for maintenance of NWBing precuations. Pt verablized understanding. Pt demos ability to read patients written notes next to each exercise for maintenance of precautions.   Access Code: [email or web address] URL: [email or web address] Date: 04/01/2023 Prepared by: Ambrose Finland  Exercises - Sidelying Hip Abduction  - 1 x daily - 7 x weekly - 3 sets - 10 reps - Prone Hip Extension  - 1 x daily - 7 x weekly - 3 sets - 10 reps - Supine Active Straight Leg Raise  - 1 x daily - 7 x weekly - 3 sets - 10 reps - Seated Long Arc Quad  - 1 x daily - 7 x weekly - 3 sets - 10 reps - Supine Ankle Pumps  - 1 x daily - 7 x weekly - 3 sets - 10 reps - Wheelchair Push-Up (AKA)  - 1 x daily - 7 x weekly - 3 sets - 10 reps - Prone Press Up  - 1 x daily - 7 x weekly - 3 sets - 10 reps - Seated March  - 1 x daily - 7 x weekly - 3 sets - 10 reps  Pt had bags all packed up for discharge tomorrow, therapist added  HEP handout to pt belongings. Therapist also brought pt 2nd shrinker from bathroom and added it to pt belongings.   Pt had questions regarding time of discharge tomorrow and transportation. Education provided for ambulance transport and discharge around 10 am. Pt verablized understanding. Pt seated in [facility] with all needs within reach at end of session.   Therapy Documentation Precautions:  Precautions Precautions: Fall Precaution Comments: L BKA, R foot wound, wound vac Required Braces or Orthoses: Other Brace Other Brace: L LE limb protector Restrictions Weight Bearing Restrictions: Yes RLE Weight Bearing: Non weight bearing LLE Weight Bearing: Non  weight bearing  Therapy/Group: Individual Therapy  [facility] Ambrose Finland, [place], DPT  04/01/2023, 7:41 AM

## 2023-04-01 NOTE — Progress Notes (Addendum)
Patient ID: Jesse [name], male   DOB: [birth year], 62 y.o.   MRN: [id number]  Spoke with PA and RN Coordinator who reports he will need a regular wound vac not the prevena due to too much drainage from foot. Have contacted Brianne[place] to inform he will need this over the disposble one due to amount of drainage coming from foot. Do have insurance approval and Colin Mulders feels can get wound vac for tomorrow transfer. Sandra-PA aware of transfer tomrorrow and will let pt and brother know.  9:19 AM Told by MD wound vac is off and will go tomorrow without one. Called Brianne back to inform her of this. Have informed pt, along with Mike-brother and Lisa-sister of transfer tomorrow. Work on transfer for tomorrow.  2:11 PM Have set up [facility] for 10:00 tomorrow to transport to [facility]. Pt and family aware.

## 2023-04-01 NOTE — Progress Notes (Signed)
Patient ID: Jesse [name], male   DOB: 1961/10/21, 62 y.o.   MRN: [id number] The wound VAC dressing is removed there was a large hematoma beneath the dressing.  The foot was cleansed the heel ulcer is stable.  Patient will need dry dressing changes daily to the right heel ulcer.  He will continue nonweightbearing on the right.

## 2023-04-01 NOTE — Progress Notes (Signed)
Occupational Therapy Discharge Summary  Patient Details  Name: Jesse [name] MRN: [id number] Date of Birth: 1960/11/16  Date of Discharge from OT service:Apr 01, 2023  Today's Date: 04/01/2023 OT Individual Time: 1300-1415 1st Session, 1300-1415 2nd Session  OT Individual Time Calculation (min): 75 min, 75 min    Patient has met 6 of 6 long term goals due to improved activity tolerance, improved balance, postural control, and ability to compensate for deficits.  Patient to discharge at overall Modified Independent level from w/c level. Using [facility] for toileting and w/c for mobility.  Patient's care partner is SNF and is independent to provide the necessary physical assistance at discharge for follow up wound care needs.   Reasons goals not met: n/a   Recommendation:  Patient will benefit from ongoing skilled OT services in skilled nursing facility setting to continue to advance functional skills in the area of BADL, iADL, and Reduce care partner burden.  Equipment: No equipment provided  Reasons for discharge: treatment goals met  Patient/family agrees with progress made and goals achieved: Yes Sister also present for discharge visit   OT Discharge Precautions/Restrictions  Precautions Precautions: Fall Precaution Comments: L BKA, R foot wound, Required Braces or Orthoses: Other Brace Other Brace: L LE limb protector Restrictions Weight Bearing Restrictions: Yes RLE Weight Bearing: Non weight bearing LLE Weight Bearing: Non weight bearing   Therapy Vitals Temp: 97.7 F (36.5 C) Temp Source: Oral Pulse Rate: 84 Resp: 18 BP: 109/82 Patient Position (if appropriate): Sitting Oxygen Therapy SpO2: 100 % O2 Device: Room Air Pain Pain Assessment Pain Scale: 0-10 Pain Score: 6  Pain Type: Surgical pain Pain Location: Foot Pain Orientation: Right Pain Descriptors / Indicators: Aching Pain Onset: On-going Patients Stated Pain Goal: 2 Pain Intervention(s): Pain med  given for lower pain score than stated, per patient request;Rest;Elevated extremity;Distraction;Emotional support Multiple Pain Sites: No ADL ADL Eating: Independent Where Assessed-Eating: Wheelchair Grooming: Modified independent Where Assessed-Grooming: Wheelchair Upper Body Bathing: Modified independent Where Assessed-Upper Body Bathing: Sitting at sink Lower Body Bathing: Modified independent Where Assessed-Lower Body Bathing: Bed level, Wheelchair, Sitting at sink Upper Body Dressing: Modified independent (Device) Where Assessed-Upper Body Dressing: Sitting at sink Lower Body Dressing: Modified independent Where Assessed-Lower Body Dressing: Sitting at sink, Bed level Toileting: Modified independent Where Assessed-Toileting: Bedside Commode Toilet Transfer: Modified independent Toilet Transfer Method: Scientist, research (life sciences): Drop arm bedside commode Tub/Shower Transfer: Not assessed ADL Comments: Pt now mod I for all BADL's bed and PWC level including use of TB on and off DABSC and urinal use for toileting. Weight shifting for buttocks cleansing and peri care as well as donning and doffing pull up shorts. Mod I for all sink side seated ADL's. Pt mod I to don and doff L limb protector and nursing assists with R lower leg care due to wounds Vision Baseline Vision/History: 0 No visual deficits Patient Visual Report: No change from baseline Vision Assessment?: No apparent visual deficits Perception  Perception: Within Functional Limits Praxis Praxis: Intact Cognition Cognition Overall Cognitive Status: No family/caregiver present to determine baseline cognitive functioning Arousal/Alertness: Awake/alert Orientation Level: Person;Place;Situation Memory: Appears intact Memory Impairment: Decreased recall of new information Awareness: Appears intact Problem Solving: Appears intact Safety/Judgment: Appears intact Brief Interview for Mental Status  (BIMS) Repetition of Three Words (First Attempt): 3 Temporal Orientation: Year: Correct Temporal Orientation: Month: Accurate within 5 days Temporal Orientation: Day: Correct Recall: "Sock": Yes, no cue required Recall: "Blue": Yes, no cue required Recall: "Bed": Yes, no cue  required BIMS Summary Score: 15 Sensation Sensation Light Touch: Impaired Detail Central sensation comments: light touch imapired on distal residual limb as well as distal R LE post I&D and wound vac removal. Proprioception: Appears Intact Stereognosis: Appears Intact Coordination Gross Motor Movements are Fluid and Coordinated: No Fine Motor Movements are Fluid and Coordinated: No (B hand stiffness) Coordination and Movement Description: L BKA Finger Nose Finger Test: [facility] Motor  Motor Motor: Other (comment) Motor - Skilled Clinical Observations: L BKA, R LE wound s/p I&D Mobility  Bed Mobility Bed Mobility: Sit to Supine;Supine to Sit Supine to Sit: Independent with assistive device Sit to Supine: Independent with assistive device  Trunk/Postural Assessment  Cervical Assessment Cervical Assessment: Within Functional Limits Thoracic Assessment Thoracic Assessment: Within Functional Limits Lumbar Assessment Lumbar Assessment: Within Functional Limits Postural Control Postural Control: Deficits on evaluation Righting Reactions: Delayed  Balance Balance Balance Assessed: Yes Static Sitting Balance Static Sitting - Level of Assistance: 7: Independent Dynamic Sitting Balance Dynamic Sitting - Level of Assistance: 6: Modified independent (Device/Increase time) Sitting balance - Comments: mod I with slidebaord transfer Extremity/Trunk Assessment RUE Assessment RUE Assessment: Within Functional Limits General Strength Comments: Cramping in B hands continues to affect FMC but significantly improved LUE Assessment LUE Assessment: Within Functional Limits General Strength Comments: Cramping in B hands  affects FMC but improved from IE  1st Session: Brief session to address skin integrity, B UE HEP and ADL item gathering all in prep for d/c tomorrow. BIMS scored 15/15. Pt used w/c to gather items and plac in personal property bags. Issued green tband (heavy) and pt able to demo chest pulls, sh flexion, and triceps press all 3 sets of 10 reps with mod I. Plan for full toileting and self care and mobility retraining completion last visit at end of day. Pt left with all needs and safety measures in place.   Pain: 8/10 R LE with elevation, meds and rest    2nd Session:   Completed full discharge reassessment and training visit with pt with focus on DABSC transfer for toileting with BM mngt, UB and LB self care including peri and buttocks hygiene and sink side sponge bathing and dressing. Pt now mod I for all aspects of care except mngt of R LE dressing and care. Pt performing lateral TB transfer from w/c to and from [facility], removal and replacement of L LE limb protector, peri and buttocks hygiene and LE dressing doffing and donning elastic waist basketball shorts. Sink side w/c level sponge bathing, oral care and dressing mod I. OT completed skin protection, positioning and falls prevention education and completed CIR education and discharge this visit.    Pain:  8/10 R LE with elevation, meds and rest   Vicenta Dunning 04/01/2023, 4:16 PM

## 2023-04-01 NOTE — Progress Notes (Incomplete)
Inpatient Rehabilitation Care Coordinator Discharge Note   Patient Details  Name: OTHON [name] MRN: [id number] Date of Birth: 09-Jan-1961   Discharge location: GOING TO [facility] FOR MORE REHAB  Length of Stay: 15 DAYS  Discharge activity level: MIN ASSIST WHEELCHAIR LEVEL  Home/community participation: ACTIVE  Patient response WU:JWJXBJ Literacy - How often do you need to have someone help you when you read instructions, pamphlets, or other written material from your doctor or pharmacy?: Never  Patient response YN:WGNFAO Isolation - How often do you feel lonely or isolated from those around you?: Never  Services provided included: MD, RD, PT, OT, RN, CM, TR, Pharmacy, Neuropsych, SW  Financial Services:  Financial Services Utilized: Medicaid    Choices offered to/list presented to: PT AND BROTHER  Follow-up services arranged:  Other (Comment) (SNF)     [facility] REFERRAL MADE FOR ASSISTANCE WITH SSD/SSI APPLICATION      Patient response to transportation need: Is the patient able to respond to transportation needs?: Yes In the past 12 months, has lack of transportation kept you from medical appointments or from getting medications?: Yes In the past 12 months, has lack of transportation kept you from meetings, work, or from getting things needed for daily living?: Yes   Patient/Family verbalized understanding of follow-up arrangements:  Yes  Individual responsible for coordination of the follow-up plan: SELF AND MIKE-BROTHER [phone]  Confirmed correct DME delivered: Lucy Chris 04/01/2023    Comments (or additional information): PT AGREED TO GOING TO A SNF FOR MORE REHAB AND FOR WOUND HEALING. HE WAS NOT MANAGING WELL AT HOME ALONE. HIS SIBLINGS CALL BUT DO NOT COME AND SEE HIM. CONCERN IS HIS WOUND ON HIS LEFT LEG WILL NEED TO BE AMPUTATED IF DOES NOT HEAL. FOLLOW UP WITH DR DUDA. CHALLENGING PT THAT HAS VERY LITTLE SUPPORTS AND WILL NEED TO STAY  IN A FACILITY UNTIL MOD/I AND ABLE TO CARE FOR HIMSELF   Summary of Stay    Date/Time Discharge Planning CSW  03/26/23 0937 Pt and family have differing opinions pt wants to return home and family wants him to go to a facility. Dr Lajoyce Corners did see this am and plans on MRI of other leg, may need more surgery. Landlord has not evicted him but will start if comes home according to him. Family very concerned but only can check on him. Continue to work on plan for pt. He feels if had power chair would do better than was doing prior to admission RGD  03/19/23 0844 New evaluation today-home alone will need to be mod/i level and was told would be prior to admission here. Has cousin who may help? RGD       Lucy Chris

## 2023-04-01 NOTE — Progress Notes (Signed)
Physical Therapy Discharge Summary  Patient Details  Name: Jesse [name] MRN: [id number] Date of Birth: 1960/11/28  Date of Discharge from PT service:Apr 01, 2023  Patient has met 6 of 6 long term goals due to improved activity tolerance, improved balance, improved postural control, increased strength, decreased pain, ability to compensate for deficits, improved attention, improved awareness, and improved coordination.  Patient to discharge at a wheelchair level Modified Independent.   Patient's care partner unavailable to provide the necessary physical assistance at discharge. Pt will be discharging to SNF to receive assistance for wound care.   Recommendation:  Patient will benefit from ongoing skilled PT services in skilled nursing facility setting to continue to advance safe functional mobility, address ongoing impairments and minimize fall risk.  Equipment: No equipment provided  Reasons for discharge: treatment goals met and discharge from hospital  Patient/family agrees with progress made and goals achieved: Yes  PT Discharge Precautions/Restrictions Precautions Precautions: Fall Precaution Comments: L BKA, R foot wound, Required Braces or Orthoses: Other Brace Other Brace: L LE limb protector Restrictions RLE Weight Bearing: Non weight bearing LLE Weight Bearing: Non weight bearing Vision/Perception  Vision - History Ability to See in Adequate Light: 0 Adequate Perception Perception: Within Functional Limits Praxis Praxis: Intact  Cognition Arousal/Alertness: Awake/alert Year: 2024 Month: May Day of Week: Correct Memory: Appears intact Awareness: Appears intact Problem Solving: Appears intact Safety/Judgment: Appears intact Sensation Sensation Light Touch: Impaired Detail Central sensation comments: light touch imapired on distal residual limb as well as distal R LE post I&D and wound vac removal. Coordination Gross Motor Movements are Fluid and  Coordinated: No Fine Motor Movements are Fluid and Coordinated: Yes Coordination and Movement Description: L BKA Finger Nose Finger Test: [facility] Motor  Motor Motor: Other (comment) Motor - Skilled Clinical Observations: L BKA, R LE wound s/p I&D  Mobility Bed Mobility Bed Mobility: Sit to Supine;Supine to Sit Supine to Sit: Independent with assistive device Sit to Supine: Independent with assistive device Transfers Transfers: Lateral/Scoot Transfers Sit to Stand:  (no longer safe to perform 2/2 pt NWBing B) Lateral/Scoot Transfers: Independent with assistive device Transfer (Assistive device): Other (Comment) (slideboard) Locomotion  Gait Ambulation: No Gait Gait: No Stairs / Additional Locomotion Stairs: No Corporate treasurer: Yes Wheelchair Assistance: Independent with assistive device (with use of power wheelchair) Occupational hygienist: Power Wheelchair Parts Management: Independent Distance: 150+  Trunk/Postural Assessment  Cervical Assessment Cervical Assessment: Within Film/video editor Assessment: Within Functional Limits Lumbar Assessment Lumbar Assessment: Within Functional Limits Postural Control Postural Control: Deficits on evaluation Righting Reactions: Delayed  Balance Balance Balance Assessed: Yes Static Sitting Balance Static Sitting - Level of Assistance: 7: Independent Dynamic Sitting Balance Dynamic Sitting - Level of Assistance: 6: Modified independent (Device/Increase time) Sitting balance - Comments: mod I with slidebaord transfer Extremity Assessment  RLE Assessment RLE Assessment: Within Functional Limits LLE Assessment LLE Assessment: Exceptions to [facility] General Strength Comments: grossly [address] PT, DPT  04/01/2023, 11:47 AM

## 2023-04-01 NOTE — Progress Notes (Signed)
PROGRESS NOTE   Subjective/Complaints:   HGB up to 8.1 on labs yesterday.  Dr. Lajoyce Corners removed wound vac this AM. DC to SNF tomorrow.  No complaints today.    Review of Systems  Constitutional:  Negative for fever.  Respiratory:  Negative for shortness of breath.   Cardiovascular:  Negative for chest pain.  Gastrointestinal:  Negative for abdominal pain, nausea and vomiting.  Genitourinary: Negative.   Musculoskeletal:  Positive for joint pain.  Neurological:  Positive for sensory change and weakness. Negative for dizziness and headaches.  Psychiatric/Behavioral:  Positive for depression.      Objective:   No results found. Recent Labs    03/30/23 0532 03/31/23 1116  WBC 9.0 10.3  HGB 7.0* 8.1*  HCT 21.9* 26.3*  PLT 376 400    Recent Labs    03/30/23 0532 03/31/23 1116  NA 135 137  K 4.2 4.3  CL 105 106  CO2 22 22  GLUCOSE 166* 193*  BUN 40* 33*  CREATININE 1.10 0.94  CALCIUM 8.7* 9.3     Intake/Output Summary (Last 24 hours) at 04/01/2023 0829 Last data filed at 04/01/2023 0430 Gross per 24 hour  Intake 596 ml  Output 1200 ml  Net -604 ml         Physical Exam: Vital Signs Blood pressure (!) 97/58, pulse 90, temperature 98.8 F (37.1 C), resp. rate 15, height 6' (1.829 m), weight 89 kg, SpO2 100 %.  Physical Exam: Blood pressure 117/66, pulse 75, temperature 98.2 F (36.8 C), temperature source Oral, resp. rate 18, height 6' (1.829 m), weight 96.3 kg, SpO2 99 %.   General: awake, alert, appropriate, working with therapy in the gym HENT: conjugate gaze; oropharynx moist CV: regular rate and rhythm; no JVD Pulmonary: CTA B/L; no W/R/R- no increased WOBC GI: soft, NT, ND, (+)BS- normoactive Psychiatric: appropriate, pleasant Neurological: Ox3  Extremities: Right heel with dry dressing, strong odor noted L BKA Psych: Pt's affect is appropriate. Pt is cooperative  Musculoskeletal:      Right lower leg: Edema present.    Neurological:     General: No focal deficit present.     Mental Status: He is alert and oriented to person, place, and time.     Comments: Alert and oriented x 3. Normal insight and awareness. Intact Memory. Normal language and speech. Cranial nerve exam unremarkable. MMT: 5/5 BLE, RLE grossly 4/5 prox to distal with some limitations distally. LLE limited by vac but grossly 3-4/5. Decreased LT RLE below mid calf.  Psychiatric:        Mood and Affect: Appropriate, normal affect        Assessment/Plan: 1. Functional deficits which require 3+ hours per day of interdisciplinary therapy in a comprehensive inpatient rehab setting. Physiatrist is providing close team supervision and 24 hour management of active medical problems listed below. Physiatrist and rehab team continue to assess barriers to discharge/monitor patient progress toward functional and medical goals  Care Tool:  Bathing    Body parts bathed by patient: Right arm, Left arm, Chest, Abdomen, Front perineal area, Right upper leg, Left upper leg, Right lower leg, Face, Buttocks   Body parts bathed by  helper: Buttocks Body parts n/a: Left lower leg (BKA)   Bathing assist Assist Level: Supervision/Verbal cueing     Upper Body Dressing/Undressing Upper body dressing   What is the patient wearing?: Pull over shirt    Upper body assist Assist Level: Supervision/Verbal cueing    Lower Body Dressing/Undressing Lower body dressing      What is the patient wearing?: Pants     Lower body assist Assist for lower body dressing: Minimal Assistance - Patient > 75%     Toileting Toileting    Toileting assist Assist for toileting: Moderate Assistance - Patient 50 - 74%     Transfers Chair/bed transfer  Transfers assist  Chair/bed transfer activity did not occur: N/A  Chair/bed transfer assist level: Supervision/Verbal cueing (SB)     Locomotion Ambulation   Ambulation  assist   Ambulation activity did not occur: Safety/medical concerns (pt refused due to R LE pain/wounds)          Walk 10 feet activity   Assist  Walk 10 feet activity did not occur: Safety/medical concerns        Walk 50 feet activity   Assist Walk 50 feet with 2 turns activity did not occur: Safety/medical concerns         Walk 150 feet activity   Assist Walk 150 feet activity did not occur: Safety/medical concerns         Walk 10 feet on uneven surface  activity   Assist Walk 10 feet on uneven surfaces activity did not occur: Safety/medical concerns         Wheelchair     Assist Is the patient using a wheelchair?: Yes Type of Wheelchair: Manual    Wheelchair assist level: Supervision/Verbal cueing      Wheelchair 50 feet with 2 turns activity    Assist        Assist Level: Supervision/Verbal cueing   Wheelchair 150 feet activity     Assist      Assist Level: Supervision/Verbal cueing   Blood pressure (!) 97/58, pulse 90, temperature 98.8 F (37.1 C), resp. rate 15, height 6' (1.829 m), weight 89 kg, SpO2 100 %.  Medical Problem List and Plan: 1. 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  Impaired mobility: continue Lovenox             -antiplatelet therapy: Aspirin and Plavix    3. Pain: Tylenol, oxycodone as needed             -continue gabapentin 100 mg TID             -pt reports reasonable pain control at present  5/18- pt  reports pain is really bad and only taking meds ~ 1x/day- asked him to ask for it more frequently.  4. Mood/Behavior/Sleep: LCSW to evaluate and provide emotional support             -  antipsychotic agents: n/a   5. Neuropsych/cognition: This patient is capable of making decisions on his own behalf.   6. Wound vac:              -Prevena vac comes off on 5/11---placed order for removal, residual limb examined and healing well             -right heel ulcer; off-load and continue daily dressing changes  -Dicussed with Dr. Lajoyce Corners 5/9, ordered ARAFO to wear at all times ok to WB in [facility], otherwise NWB  -He continues to have drainage from his right foot wound, drainage with strong odor, last Dr. Lajoyce Corners for recommendations regarding wound management  -Debridement completed 5/17 by Dr. Lajoyce Corners, nursing to call WOC to eval wound vac  5/18- odor much better- VAC on R foot- s/p debridement- labs Monday to make sure Hb is stable   5/19- Hb down to 7.0- will transfuse 1 unit pRBCs since appears to be actively bleeding- however, went to try and encourage pt to do- he says he doesn't feel dizzy so doesn't want  blood at this time- went over the full risks/benefits and he said would re-eval tomorrow AM if it's lower. Has received Plavix/Lovenox and ASA today- will hold /stop for tomorrow AM for now- will all need to be restarted when safe. Pt DID sign consent, but doesn't want blood right now.   5/21 VAC removed by Dr. Lajoyce Corners today, large hematoma was behind dressing, continue NWB to right LE 7. Fluids/Electrolytes/Nutrition: Routine Is and Os and follow-up chemistries             -continue MVI and Vit C, zinc supplementation             -continue Juven   9: Hyperlipidemia: -Hold statin for a few days, recheck Monday -5/14 statin has been restarted   10: DM-2: CBGs QID; carb modified diet             -continue Semglee 5 units at bedtime  D/c bedtime ISS given excellent control and since patient will not have this at  home  -5/17 controlled overall, CBGs lower than usual today due to recent NPO, diet to be resumed   5/18- BG's 120s-150s- con't regimen  5/21 well controlled  CBG (last 3)  Recent Labs    03/31/23 1634 03/31/23 2051 04/01/23 0642  GLUCAP 129* 162* 117*       11: Hyponatremia: trend/follow-up BMP -5/8 Na stable 133 -5/21 Na stable 137     Latest Ref Rng & Units 03/31/2023   11:16 AM 03/30/2023    5:32 AM 03/27/2023    6:58 AM  BMP  Glucose 70 - 99 mg/dL 098  119  147   BUN 6 - 20 mg/dL 33  40  35   Creatinine 0.61 - 1.24 mg/dL 8.29  5.62  1.30   Sodium 135 - 145 mmol/L 137  135  136   Potassium 3.5 - 5.1 mmol/L 4.3  4.2  4.4   Chloride 98 - 111 mmol/L 106  105  105   CO2 22 - 32 mmol/L 22  22  23    Calcium 8.9 - 10.3 mg/dL 9.3  8.7  9.2       12: Elevated BUN: follow-up BMP             -encourage fluids  -5/8 BUN stable 25, continue to encourage fluid intake  -5/13 Cr down to 1.07  5/19- BUN 40- pt doesn't want IVFs- will drink-  5/21 Bun down to 33, cr down to 0.94, continue to encourage oral fluids  13: Leukocytosis: downward trend; follow-up CBC  -5/16 WBC stable at 9.2  5/17- WBC 12.6- is afebrile- and s/p debridement- will recheck tomorrow  5/21WBC 10.3 14: ABLA Anemia: multifactorial/acute blood loss: follow-up CBC  -5/8 HGB stable at 8.9  -5/16 Hgb stable at 9.2  Recheck Monday unless has continued significant bleeding    5.18- Hb 8.5- will recheck in AM  5/19- Hb 7.0 today- pt refusing IVFs or Blood at this time-doesn't feel bad/dizzy, so doesn't want blood.    5/20- Declined labs in AM but agreed to do this now. He says he would consider transfusion.  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 Lajoyce Corners on 4/07 to have legs wrapped with Dynaflex   16 Right Eye irritation  -continue Lubricating drops ordered Q4h prn  17.  Elevated  LFTs -Hold statin for a few days, decrease Tylenol to every 6 hours as needed,  -AST down to 33, ALT still 60  -5/16 AST and ALT improved today -6/21 AST/ALT WNL yesterday  18: Thrombocytosis  -5/16 PLT down to 493, likely reactive  5/19- Plts down to 376k 19. Dry skin: Eucerin ordered  20. Hypotension-   5/18- BP low 80s-90s systolic- on no BP meds- if dry, will give IVFs tomorrow- said not symptomatic- if stays low, might need midodrine.   5/19- BP doing better- 100s-110s today. Didn't get dizzy/lightheade din therapy.   5/20 vitals stable this AM     04/01/2023    4:31 AM 03/31/2023    8:50 PM 03/31/2023    3:36 PM  Vitals with BMI  Systolic 97 131 112  Diastolic 58 68 66  Pulse 90 84 84    20. Constipation  -sorbitol 30mg  ordered  21. Low mg -Slow mg started   LOS: 14 days A FACE TO FACE EVALUATION WAS PERFORMED  Fanny Dance 04/01/2023, 8:29 AM

## 2023-04-02 DIAGNOSIS — M25561 Pain in right knee: Secondary | ICD-10-CM

## 2023-04-02 LAB — CBC
HCT: 22.2 % — ABNORMAL LOW (ref 39.0–52.0)
Hemoglobin: 7.1 g/dL — ABNORMAL LOW (ref 13.0–17.0)
MCH: 31.6 pg (ref 26.0–34.0)
MCHC: 32 g/dL (ref 30.0–36.0)
MCV: 98.7 fL (ref 80.0–100.0)
Platelets: 340 10*3/uL (ref 150–400)
RBC: 2.25 MIL/uL — ABNORMAL LOW (ref 4.22–5.81)
RDW: 14.6 % (ref 11.5–15.5)
WBC: 10 10*3/uL (ref 4.0–10.5)
nRBC: 0 % (ref 0.0–0.2)

## 2023-04-02 MED ORDER — DOXYCYCLINE HYCLATE 100 MG PO TABS: 100.0000 mg | ORAL_TABLET | Freq: Two times a day (BID) | ORAL | 0 refills | Status: AC

## 2023-04-02 MED ORDER — INSULIN GLARGINE-YFGN 100 UNIT/ML ~~LOC~~ SOLN: 5.0000 [IU] | Freq: Every day | SUBCUTANEOUS | 11 refills | Status: AC

## 2023-04-02 MED ORDER — LEVOFLOXACIN 750 MG PO TABS: 750.0000 mg | ORAL_TABLET | Freq: Every day | ORAL | 0 refills | Status: AC

## 2023-04-02 MED ORDER — INSULIN ASPART 100 UNIT/ML IJ SOLN: 0.0000 [IU] | Freq: Three times a day (TID) | INTRAMUSCULAR | 11 refills | Status: AC

## 2023-04-02 MED ORDER — ARTIFICIAL TEARS OPHTHALMIC OINT: TOPICAL_OINTMENT | OPHTHALMIC | Status: AC | PRN

## 2023-04-02 MED ORDER — MAGNESIUM CHLORIDE 64 MG PO TBEC: 1.0000 | DELAYED_RELEASE_TABLET | Freq: Two times a day (BID) | ORAL | Status: AC

## 2023-04-02 MED ORDER — OXYCODONE HCL 5 MG PO TABS: 5.0000 mg | ORAL_TABLET | Freq: Four times a day (QID) | ORAL | 0 refills | Status: AC | PRN

## 2023-04-02 MED ORDER — ACETAMINOPHEN 325 MG PO TABS: 325.0000 mg | ORAL_TABLET | Freq: Four times a day (QID) | ORAL | Status: AC | PRN

## 2023-04-02 NOTE — Progress Notes (Addendum)
PROGRESS NOTE   Subjective/Complaints:   Pt to discharge to SNF today.  No new concerns. Denies pain.   No complaints today.    Review of Systems  Constitutional:  Negative for fever.  HENT:  Negative for congestion.   Eyes:  Negative for double vision.  Respiratory:  Negative for shortness of breath.   Cardiovascular:  Negative for chest pain.  Gastrointestinal:  Negative for abdominal pain, nausea and vomiting.  Genitourinary: Negative.   Musculoskeletal:  Positive for joint pain.  Skin:  Negative for rash.  Neurological:  Positive for sensory change and weakness. Negative for dizziness and headaches.     Objective:   No results found. Recent Labs    03/31/23 1116 04/02/23 0554  WBC 10.3 10.0  HGB 8.1* 7.1*  HCT 26.3* 22.2*  PLT 400 340    Recent Labs    03/31/23 1116  NA 137  K 4.3  CL 106  CO2 22  GLUCOSE 193*  BUN 33*  CREATININE 0.94  CALCIUM 9.3     Intake/Output Summary (Last 24 hours) at 04/02/2023 0854 Last data filed at 04/02/2023 2956 Gross per 24 hour  Intake 1080 ml  Output 1050 ml  Net 30 ml         Physical Exam: Vital Signs Blood pressure 110/60, pulse 91, temperature 98.4 F (36.9 C), temperature source Oral, resp. rate 16, height 6' (1.829 m), weight 89 kg, SpO2 100 %.  Physical Exam: Blood pressure 117/66, pulse 75, temperature 98.2 F (36.8 C), temperature source Oral, resp. rate 18, height 6' (1.829 m), weight 96.3 kg, SpO2 99 %.   General: awake, alert, appropriate, working with therapy in the gym HENT: conjugate gaze; oropharynx moist CV: regular rate and rhythm; no JVD Pulmonary: CTA B/L; no W/R/R- no increased WOBC GI: soft, NT, ND, (+)BS- normoactive Psychiatric: appropriate, pleasant Neurological: Ox3  Extremities: Right heel with dry dressing, odor appears improved today L BKA-incision appears to be healing well, no signs of infection Psych: Pt's  affect is appropriate. Pt is cooperative  Musculoskeletal:     Right lower leg: Edema present.    Neurological:     General: No focal deficit present.     Mental Status: He is alert and oriented to person, place, and time.     Comments: Alert and oriented x 3. Normal insight and awareness. Intact Memory. Normal language and speech. Cranial nerve exam unremarkable. MMT: 5/5 BLE, RLE grossly 4/5 prox to distal with some limitations distally. LLE limited by vac but grossly 3-4/5. Decreased LT RLE below mid calf.  Psychiatric:        Mood and Affect: Appropriate, normal affect        Assessment/Plan: 1. Functional deficits which require 3+ hours per day of interdisciplinary therapy in a comprehensive inpatient rehab setting. Physiatrist is providing close team supervision and 24 hour management of active medical problems listed below. Physiatrist and rehab team continue to assess barriers to discharge/monitor patient progress toward functional and medical goals  Care Tool:  Bathing    Body parts bathed by patient: Right arm, Left arm, Chest, Abdomen, Front perineal area, Right upper leg, Left upper leg, Face, Buttocks  Body parts bathed by helper: Buttocks Body parts n/a: Left lower leg, Right lower leg   Bathing assist Assist Level: Independent with assistive device     Upper Body Dressing/Undressing Upper body dressing   What is the patient wearing?: Pull over shirt    Upper body assist Assist Level: Independent with assistive device    Lower Body Dressing/Undressing Lower body dressing      What is the patient wearing?: Pants     Lower body assist Assist for lower body dressing: Independent with assitive device     Toileting Toileting    Toileting assist Assist for toileting: Independent with assistive device     Transfers Chair/bed transfer  Transfers assist  Chair/bed transfer activity did not occur: N/A  Chair/bed transfer assist level: Independent  with assistive device Chair/bed transfer assistive device: Sliding board   Locomotion Ambulation   Ambulation assist   Ambulation activity did not occur: Safety/medical concerns          Walk 10 feet activity   Assist  Walk 10 feet activity did not occur: Safety/medical concerns        Walk 50 feet activity   Assist Walk 50 feet with 2 turns activity did not occur: Safety/medical concerns         Walk 150 feet activity   Assist Walk 150 feet activity did not occur: Safety/medical concerns         Walk 10 feet on uneven surface  activity   Assist Walk 10 feet on uneven surfaces activity did not occur: Safety/medical concerns         Wheelchair     Assist Is the patient using a wheelchair?: Yes Type of Wheelchair: Power    Wheelchair assist level: Independent Max wheelchair distance: 150+    Wheelchair 50 feet with 2 turns activity    Assist        Assist Level: Independent   Wheelchair 150 feet activity     Assist      Assist Level: Independent   Blood pressure 110/60, pulse 91, temperature 98.4 F (36.9 C), temperature source Oral, resp. rate 16, height 6' (1.829 m), weight 89 kg, SpO2 100 %.  Medical Problem List and Plan: 1. 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  Impaired mobility: continue Lovenox             -antiplatelet therapy: Aspirin and Plavix, held for surgery, to be restarted   3. Pain:  Tylenol, oxycodone as needed             -continue gabapentin 100 mg TID             -pt reports reasonable pain control at present  5/18- pt reports pain is really bad and only taking meds ~ 1x/day- asked him to ask for it more  frequently.   5/22- continue current medications PRN for pain 4. Mood/Behavior/Sleep: LCSW to evaluate and provide emotional support             -antipsychotic agents: n/a   5. Neuropsych/cognition: This patient is capable of making decisions on his own behalf.   6. Wound vac:              -Prevena vac comes off on 5/11---placed order for removal, residual limb examined and healing well             -right heel ulcer; off-load and continue daily dressing changes  -Dicussed with Dr. Lajoyce Corners 5/9, ordered ARAFO to wear at all times ok to WB in [facility], otherwise NWB  -He continues to have drainage from his right foot wound, drainage with strong odor, last Dr. Lajoyce Corners for recommendations regarding wound management  -Debridement completed 5/17 by Dr. Lajoyce Corners, nursing to call WOC to eval wound vac  5/18- odor much better- VAC on R foot- s/p debridement- labs Monday to make sure Hb is stable   5/19- Hb down to 7.0- will transfuse 1 unit pRBCs since appears to be actively bleeding- however, went to try and encourage pt to do- he says he doesn't feel dizzy so doesn't want  blood at this time- went over the full risks/benefits and he said would re-eval tomorrow AM if it's lower. Has received Plavix/Lovenox and ASA today- will hold /stop for tomorrow AM for now- will all need to be restarted when safe. Pt DID sign consent, but doesn't want blood right now.   5/21 VAC removed by Dr. Lajoyce Corners today, large hematoma was behind dressing, continue NWB to right LE  5/22 continue daily dressing changes to R heel wound 7. Fluids/Electrolytes/Nutrition: Routine Is and Os and follow-up chemistries             -continue MVI and Vit C, zinc supplementation             -continue Juven   9:  Hyperlipidemia: -Hold statin for a few days, recheck Monday -5/14 statin has been restarted   10: DM-2: CBGs QID; carb modified diet             -continue Semglee 5 units at bedtime  D/c bedtime ISS given excellent control and since patient will not have this at home  -5/17 controlled overall, CBGs lower than usual today due to recent NPO, diet to be resumed   5/18- BG's 120s-150s- con't regimen  5/22 well controlled, continue current  CBG (last 3)  Recent Labs    04/01/23 1151 04/01/23 1635 04/01/23 2237  GLUCAP 145* 159* 187*       11: Hyponatremia: trend/follow-up BMP -5/8 Na stable 133 -5/21 Na stable 137     Latest Ref Rng & Units 03/31/2023   11:16 AM 03/30/2023    5:32 AM 03/27/2023    6:58 AM  BMP  Glucose 70 - 99 mg/dL 161  096  045   BUN 6 - 20 mg/dL 33  40  35   Creatinine 0.61 - 1.24 mg/dL 4.09  8.11  9.14   Sodium 135 - 145 mmol/L 137  135  136   Potassium 3.5 - 5.1 mmol/L 4.3  4.2  4.4   Chloride 98 - 111 mmol/L 106  105  105   CO2 22 - 32 mmol/L 22  22  23    Calcium 8.9 - 10.3 mg/dL 9.3  8.7  9.2       12: Elevated  BUN: follow-up BMP             -encourage fluids  -5/8 BUN stable 25, continue to encourage fluid intake  -5/13 Cr down to 1.07  5/19- BUN 40- pt doesn't want IVFs- will drink-   5/21 Bun down to 33, cr down to 0.94, continue to encourage oral fluids  13: Leukocytosis: downward trend; follow-up CBC  -5/16 WBC stable at 9.2  5/17- WBC 12.6- is afebrile- and s/p debridement- will recheck tomorrow  5/21WBC 10.3 14: ABLA Anemia: multifactorial/acute blood loss: follow-up CBC  -5/8 HGB stable at 8.9  -5/16 Hgb stable at 9.2  Recheck Monday unless has continued significant bleeding    5.18- Hb 8.5- will recheck in AM  5/19- Hb 7.0 today- pt refusing IVFs or Blood at this time-doesn't feel bad/dizzy, so doesn't want blood.    5/20- Declined labs in AM but agreed to do this now. He says he would consider transfusion.  5/21 HGB stable 8.1 on  labs yesterday, recheck later this week  5/22 HGB 7.1 today, recheck in 3-5 days at SNF  15: 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 Lajoyce Corners on 4/07 to have legs wrapped with Dynaflex   16 Right Eye irritation  -continue Lubricating drops ordered Q4h prn  17.  Elevated LFTs -Hold statin for a few days, decrease Tylenol to every 6 hours as needed,  -AST down to 33, ALT still 60  -5/16 AST and ALT improved today -6/21 AST/ALT WNL yesterday  18: Thrombocytosis  -5/16 PLT down to 493, likely reactive  5/22 plt down to 340 19. Dry skin: Eucerin ordered  20. Hypotension-   5/18- BP low 80s-90s systolic- on no BP meds- if dry, will give IVFs tomorrow- said not symptomatic- if stays low, might need midodrine.   5/19- BP doing better- 100s-110s today. Didn't get dizzy/lightheade din therapy.   5/22 BP stable, continue to monitor at [facility]     04/01/2023    8:44 PM 04/01/2023    3:02 PM 04/01/2023    4:31 AM  Vitals with BMI  Systolic 110 109 97  Diastolic 60 82 58  Pulse 91 84 90    20. Constipation  -sorbitol 30mg  ordered  21. Low mg -Slow mg started   LOS: 15 days A FACE TO FACE EVALUATION WAS PERFORMED  Fanny Dance 04/02/2023, 8:54 AM

## 2023-04-02 NOTE — Plan of Care (Signed)
  Problem: RH Bed Mobility Goal: LTG Patient will perform bed mobility with assist (PT) Description: LTG: Patient will perform bed mobility with assistance, with/without cues (PT). Outcome: Completed/Met   Problem: RH Bed to Chair Transfers Goal: LTG Patient will perform bed/chair transfers w/assist (PT) Description: LTG: Patient will perform bed to chair transfers with assistance (PT). Outcome: Completed/Met Flowsheets (Taken 04/02/2023 1200) LTG: Pt will perform Bed to Chair Transfers with assistance level: (slideboard) --   Problem: RH Car Transfers Goal: LTG Patient will perform car transfers with assist (PT) Description: LTG: Patient will perform car transfers with assistance (PT). Outcome: Completed/Met Flowsheets (Taken 04/02/2023 1200) LTG: Pt will perform car transfers with assist:: (slide board) --   Problem: RH Wheelchair Mobility Goal: LTG Patient will propel w/c in controlled environment (PT) Description: LTG: Patient will propel wheelchair in controlled environment, # of feet with assist (PT) Outcome: Completed/Met Flowsheets (Taken 04/02/2023 1200) LTG: Pt will propel w/c in controlled environ  assist needed:: (power WC) -- Goal: LTG Patient will propel w/c in home environment (PT) Description: LTG: Patient will propel wheelchair in home environment, # of feet with assistance (PT). Outcome: Completed/Met Flowsheets (Taken 04/02/2023 1200) LTG: Pt will propel w/c in home environ  assist needed:: (power WC) -- Goal: LTG Patient will propel w/c in community environment (PT) Description: LTG: Patient will propel wheelchair in community environment, # of feet with assist (PT) Outcome: Completed/Met

## 2023-04-02 NOTE — Progress Notes (Signed)
Inpatient Rehabilitation Discharge Medication Review by a Pharmacist  A complete drug regimen review was completed for this patient to identify any potential clinically significant medication issues.  High Risk Drug Classes Is patient taking? Indication by Medication  Antipsychotic No   Anticoagulant No   Antibiotic Yes Has been on Doxycycline while inpatient > to continue at discharge and Levofloxacin added  Opioid Yes PRN oxycodone - severe pain  Antiplatelet Yes Aspirin 81 mg, clopidogrel - PVD  Hypoglycemics/insulin Yes SSI, Insulin glargine - glucose control  Vasoactive Medication No   Chemotherapy No   Other Yes Atorvastatin - hyperlipidemia Gabapentin - pain Pantoprazole - GERD Dorzolamide/timolol - glaucoma Vitamin C, Zinc, magnesium chloride - supplements PRNs: Artificial tears - dry eyes Acetaminophen - mild pain Miralax - constipation     Type of Medication Issue Identified Description of Issue Recommendation(s)  Drug Interaction(s) (clinically significant)     Duplicate Therapy     Allergy     No Medication Administration End Date     Incorrect Dose     Additional Drug Therapy Needed     Significant med changes from prior encounter (inform family/care partners about these prior to discharge).    Other       Clinically significant medication issues were identified that warrant physician communication and completion of prescribed/recommended actions by midnight of the next day:  Yes  Name of provider notified for urgent issues identified:  Wendi Maya, PA-C  Provider Method of Notification:  Secure chat  Pharmacist comments:  Doxycycline not ordered for discharge > checking with Dr. Lajoyce Corners about antibiotic plan. - addendum: per Dr. Lajoyce Corners, to continue Doxycyline and add Levofloxacin for 2 weeks. He will follow up as outpatient.  Time spent performing this drug regimen review (minutes):  20   Dennie Fetters, [place] 04/02/2023 8:59 AM

## 2023-04-02 NOTE — Progress Notes (Signed)
Patient discharged off of unit with all belongings via [facility]. Attempted to call Cheyenne Adas for report x2 and no answer. Patient has no questions or concerns at time of discharge. No complications noted at this time.  Meredeth Ide Victoria Henshaw

## 2023-04-03 ENCOUNTER — Telehealth (INDEPENDENT_AMBULATORY_CARE_PROVIDER_SITE_OTHER): Payer: Self-pay

## 2023-04-03 ENCOUNTER — Telehealth: Payer: Self-pay | Admitting: Vascular Surgery

## 2023-04-03 ENCOUNTER — Encounter: Payer: Medicaid Other | Admitting: Orthopedic Surgery

## 2023-04-03 NOTE — Telephone Encounter (Signed)
-----   Message from Cephus Shelling, MD sent at 03/10/2023  8:35 AM EDT ----- Can you make sure he has follow-up in 4 to 6 weeks with right leg arterial duplex and ABIs?  Right AT angioplasty this admission.  Thanks,  Thayer Ohm

## 2023-04-08 ENCOUNTER — Ambulatory Visit (INDEPENDENT_AMBULATORY_CARE_PROVIDER_SITE_OTHER): Payer: Medicaid Other | Admitting: Orthopedic Surgery

## 2023-04-08 ENCOUNTER — Encounter: Payer: Self-pay | Admitting: Orthopedic Surgery

## 2023-04-08 DIAGNOSIS — Z89512 Acquired absence of left leg below knee: Secondary | ICD-10-CM

## 2023-04-08 DIAGNOSIS — S88112A Complete traumatic amputation at level between knee and ankle, left lower leg, initial encounter: Secondary | ICD-10-CM

## 2023-04-08 DIAGNOSIS — L97511 Non-pressure chronic ulcer of other part of right foot limited to breakdown of skin: Secondary | ICD-10-CM

## 2023-04-08 NOTE — Progress Notes (Signed)
Office Visit Note   Patient: Jesse [name]           Date of Birth: 1960-12-13           MRN: [id number] Visit Date: 04/08/2023              Requested by: Grayce Sessions, NP [address] [place],  [place] [address] PCP: Grayce Sessions, NP  Chief Complaint  Patient presents with   Right Foot - Routine Post Op    03/28/2023 right heel debridement    Left Leg - Routine Post Op    03/15/2023 left BKA       HPI: Patient is a 62 year old gentleman currently at skilled nursing who is status post left below-knee amputation and conservative wound care for the right heel ulcer.  Assessment & Plan: Visit Diagnoses:  1. Non-pressure chronic ulcer of other part of right foot limited to breakdown of skin (HCC)   2. Below-knee amputation of left lower extremity, initial encounter (HCC)     Plan: Staples are harvested patient will continue the stump shrinker on the left recommended elevation of the left leg.  Patient is placed in a PRAFO boot on the right and start Dial soap cleansing daily with dry dressing change daily.  Also elevate the right leg.  No gait training.  Patient will need a power wheelchair.  Follow-Up Instructions: Return in about 3 weeks (around 04/29/2023).   Ortho Exam  Patient is alert, oriented, no adenopathy, well-dressed, normal affect, normal respiratory effort. Examination the left below-knee amputation is well-healed staples are harvested.  Examination of the right foot he has a large plantar ulcer on the right foot has a palpable dorsalis pedis pulse ulcer measures 6 x 8 cm.  Imaging: No results found. No images are attached to the encounter.  Labs: Lab Results  Component Value Date   HGBA1C 6.9 (H) 11/21/2022   HGBA1C 6.3 10/10/2022   HGBA1C 8.3 (H) 06/20/2022   ESRSEDRATE 132 (H) 03/03/2023   ESRSEDRATE 137 (H) 07/08/2022   ESRSEDRATE 107 (H) 06/27/2022   CRP 14.1 (H) 07/08/2022   CRP 16.9 (H) 06/27/2022   CRP 27.2 (H) 04/08/2021    LABURIC 5.3 07/04/2022   LABURIC 7.0 01/14/2016   REPTSTATUS 03/31/2023 FINAL 03/28/2023   GRAMSTAIN  03/28/2023    FEW WBC PRESENT, PREDOMINANTLY PMN ABUNDANT GRAM POSITIVE COCCI IN PAIRS IN CHAINS IN CLUSTERS ABUNDANT GRAM NEGATIVE RODS FEW GRAM NEGATIVE COCCOBACILLI Performed at [facility], [address], [place], [place] [address]    CULT (A) 03/28/2023    MULTIPLE ORGANISMS PRESENT, NONE PREDOMINANT NO STAPHYLOCOCCUS AUREUS ISOLATED NO GROUP A STREP (S.PYOGENES) ISOLATED MIXED ANAEROBIC FLORA PRESENT.  CALL LAB IF FURTHER IID REQUIRED.    LABORGA PROTEUS MIRABILIS 04/18/2021     Lab Results  Component Value Date   ALBUMIN 2.5 (L) 03/31/2023   ALBUMIN 2.3 (L) 03/27/2023   ALBUMIN 1.9 (L) 03/24/2023    Lab Results  Component Value Date   MG 1.6 (L) 03/31/2023   MG 1.9 03/18/2023   MG 1.6 (L) 03/17/2023   No results found for: "VD25OH"  No results found for: "PREALBUMIN"    Latest Ref Rng & Units 04/02/2023    5:54 AM 03/31/2023   11:16 AM 03/30/2023    5:32 AM  CBC EXTENDED  WBC 4.0 - 10.5 K/uL 10.0  10.3  9.0   RBC 4.22 - 5.81 MIL/uL 2.25  2.57  2.20   Hemoglobin 13.0 -  17.0 g/dL 7.1  8.1  7.0   HCT 40.9 - 52.0 % 22.2  26.3  21.9   Platelets 150 - 400 K/uL 340  400  376   NEUT# 1.7 - 7.7 K/uL   5.5   Lymph# 0.7 - 4.0 K/uL   2.1      There is no height or weight on file to calculate BMI.  Orders:  No orders of the defined types were placed in this encounter.  No orders of the defined types were placed in this encounter.    Procedures: No procedures performed  Clinical Data: No additional findings.  ROS:  All other systems negative, except as noted in the HPI. Review of Systems  Objective: Vital Signs: There were no vitals taken for this visit.  Specialty Comments:  No specialty comments available.  PMFS History: Patient Active Problem List   Diagnosis Date Noted   Constipation 03/31/2023   Cutaneous abscess of right foot  03/28/2023   Coping style affecting medical condition 03/21/2023   Osteomyelitis of left lower extremity (HCC) 03/18/2023   Gangrene of left foot (HCC) 03/12/2023   Renal insufficiency 03/12/2023   PVD (peripheral vascular disease) (HCC) 03/12/2023   Hyponatremia 03/12/2023   Normocytic anemia 03/12/2023   Lymphedema 03/04/2023   Eye injury 11/21/2022   Hyperkalemia 07/10/2022   Chronic painful diabetic neuropathy (HCC) 07/10/2022   Metabolic acidosis, normal anion gap (NAG) 07/10/2022   Hypomagnesemia 07/10/2022   Charcot's arthropathy 06/28/2022   Leukocytosis    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)    Severe protein-calorie malnutrition (HCC)    Diabetic foot infection (HCC) 06/22/2022   Sepsis (HCC) 06/22/2022   Cellulitis in diabetic foot (HCC) 04/17/2021   Osteomyelitis of left foot (HCC) 04/14/2021   HTN (hypertension) 04/08/2021   HLD (hyperlipidemia) 04/08/2021   Alcohol abuse    Diabetic ulcer of right foot (HCC)    Diabetes mellitus type II, controlled (HCC) 01/27/2014   AKI (acute kidney injury) (HCC) 01/27/2014   Past Medical History:  Diagnosis Date   AKI (acute kidney injury) (HCC) 01/27/2014   Alcohol abuse    Anasarca    Bilateral leg edema 02/07/2021   Cellulitis    Cellulitis and abscess 01/26/2014   Cellulitis of right foot 04/17/2021   Dental abscess 01/26/2014   Diabetes mellitus type 2 in obese    Diabetic foot infection (HCC) 04/08/2021   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 2 diabetes mellitus, limited to breakdown of skin (HCC)    Facial cellulitis 01/26/2014   HTN (hypertension)    Hypokalemia 01/28/2014   Infected dental carries 01/26/2014   Leukocytosis 01/27/2014   Osteomyelitis (HCC) 04/14/2021   Right foot infection     History reviewed. No pertinent family history.  Past Surgical History:  Procedure Laterality Date   ABDOMINAL AORTOGRAM W/LOWER EXTREMITY N/A  03/06/2023   Procedure: ABDOMINAL AORTOGRAM W/LOWER EXTREMITY;  Surgeon: Cephus Shelling, MD;  Location: [facility];  Service: Cardiovascular;  Laterality: N/A;   AMPUTATION Left 03/15/2023   Procedure: AMPUTATION BELOW KNEE;  Surgeon: Nadara Mustard, MD;  Location: [facility] OR;  Service: Orthopedics;  Laterality: Left;   I & D EXTREMITY Right 04/18/2021   Procedure: IRRIGATION AND DEBRIDEMENT OF FOOT;  Surgeon: Nadara Mustard, MD;  Location: [facility] OR;  Service: Orthopedics;  Laterality: Right;   I & D EXTREMITY Right 04/20/2021   Procedure: EXCISIONAL DEBRIDEMENT RIGHT FOOT,  APPLICATION OF SKIN GRAFT;  Surgeon: Nadara Mustard, MD;  Location: [facility] OR;  Service: Orthopedics;  Laterality: Right;   I & D EXTREMITY Right 03/28/2023   Procedure: RIGHT HEEL DEBRIDEMENT;  Surgeon: Nadara Mustard, MD;  Location: [facility] OR;  Service: Orthopedics;  Laterality: Right;   PERIPHERAL VASCULAR INTERVENTION  03/06/2023   Procedure: PERIPHERAL VASCULAR INTERVENTION;  Surgeon: Cephus Shelling, MD;  Location: [facility];  Service: Cardiovascular;;   Social History   Occupational History   Occupation: Curator  Tobacco Use   Smoking status: Never   Smokeless tobacco: Never  Vaping Use   Vaping Use: Never used  Substance and Sexual Activity   Alcohol use: Not Currently    Alcohol/week: 2.0 standard drinks of alcohol    Types: 2 Cans of beer per week    Comment: Occasional   Drug use: No   Sexual activity: Not Currently    Partners: Female

## 2023-04-09 ENCOUNTER — Inpatient Hospital Stay (INDEPENDENT_AMBULATORY_CARE_PROVIDER_SITE_OTHER): Payer: Medicaid Other | Admitting: Primary Care

## 2023-04-09 ENCOUNTER — Telehealth: Payer: Self-pay | Admitting: Orthopedic Surgery

## 2023-04-09 NOTE — Telephone Encounter (Signed)
Samul Dada at [facility] nursing home P/T concerned about his stump showing a open area. And they also need to know a weight baring status--[phone]

## 2023-04-10 NOTE — Telephone Encounter (Signed)
I SW Jesse [name], she says there is no concern with his stump. She had questions about his wtb status. I let her know that he is in a PRAFO now on right and to not have any gait training. They will do pivot training with slide board for him but he will not put weight on right side.

## 2023-04-12 ENCOUNTER — Other Ambulatory Visit: Payer: Self-pay

## 2023-04-12 ENCOUNTER — Emergency Department (HOSPITAL_COMMUNITY)
Admission: EM | Admit: 2023-04-12 | Discharge: 2023-04-13 | Disposition: A | Discharge status: Home / Self Care | Payer: Medicaid Other | Attending: Emergency Medicine | Admitting: Emergency Medicine

## 2023-04-12 DIAGNOSIS — E119 Type 2 diabetes mellitus without complications: Secondary | ICD-10-CM | POA: Diagnosis not present

## 2023-04-12 DIAGNOSIS — Z794 Long term (current) use of insulin: Secondary | ICD-10-CM | POA: Diagnosis not present

## 2023-04-12 DIAGNOSIS — R067 Sneezing: Secondary | ICD-10-CM | POA: Insufficient documentation

## 2023-04-12 DIAGNOSIS — Z7902 Long term (current) use of antithrombotics/antiplatelets: Secondary | ICD-10-CM | POA: Diagnosis not present

## 2023-04-12 DIAGNOSIS — I10 Essential (primary) hypertension: Secondary | ICD-10-CM | POA: Insufficient documentation

## 2023-04-12 DIAGNOSIS — Z79899 Other long term (current) drug therapy: Secondary | ICD-10-CM | POA: Diagnosis not present

## 2023-04-12 DIAGNOSIS — Z7982 Long term (current) use of aspirin: Secondary | ICD-10-CM | POA: Diagnosis not present

## 2023-04-12 DIAGNOSIS — R040 Epistaxis: Secondary | ICD-10-CM

## 2023-04-12 MED ORDER — TRANEXAMIC ACID FOR EPISTAXIS
500.0000 mg | Freq: Once | TOPICAL | Status: AC
  Administered 2023-04-12: 500 mg via TOPICAL
  Filled 2023-04-12: qty 10

## 2023-04-12 NOTE — ED Triage Notes (Addendum)
Patient arrived with EMS from a nursing home [name])  staff reported right epistaxis this evening , he takes Plavix , received multiple Afrin nasal s[ray at nursing home with no relief . Respirations unlabored.

## 2023-04-12 NOTE — ED Provider Notes (Signed)
[facility] AT [facility] Provider Note  CSN: [id number] Arrival date & time: 04/12/23 2318  Chief Complaint(s) Epistaxis (Takes Plavix)  HPI Jesse [name] is a 62 y.o. 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 Lajoyce Corners who presents to the emergency department from skilled nursing facility for epistaxis that began earlier this evening.  Attempts to stop the bleeding with Afrin were unsuccessful.  Patient is not anticoagulated but does take Plavix 3 times daily.  He reports prior episodes in the past but they typically resolve on their own.  States that he has been sneezing and blowing his nose which prompted the bleeding to start.  He denies any other physical complaints at this time.   Epistaxis   Past Medical History Past Medical History:  Diagnosis Date   AKI (acute kidney injury) (HCC) 01/27/2014   Alcohol abuse    Anasarca    Bilateral leg edema 02/07/2021   Cellulitis    Cellulitis and abscess 01/26/2014   Cellulitis of right foot 04/17/2021   Dental abscess 01/26/2014   Diabetes mellitus type 2 in obese    Diabetic foot infection (HCC) 04/08/2021   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 2 diabetes mellitus, limited to breakdown of skin (HCC)    Facial cellulitis 01/26/2014   HTN (hypertension)    Hypokalemia 01/28/2014   Infected dental carries 01/26/2014   Leukocytosis 01/27/2014   Osteomyelitis (HCC) 04/14/2021   Right foot infection    Patient Active Problem List   Diagnosis Date Noted   Constipation 03/31/2023   Cutaneous abscess of right foot 03/28/2023   Coping style affecting medical condition 03/21/2023   Osteomyelitis of left lower extremity (HCC) 03/18/2023   Gangrene of left foot (HCC) 03/12/2023   Renal insufficiency 03/12/2023   PVD (peripheral vascular disease) (HCC)  03/12/2023   Hyponatremia 03/12/2023   Normocytic anemia 03/12/2023   Lymphedema 03/04/2023   Eye injury 11/21/2022   Hyperkalemia 07/10/2022   Chronic painful diabetic neuropathy (HCC) 07/10/2022   Metabolic acidosis, normal anion gap (NAG) 07/10/2022   Hypomagnesemia 07/10/2022   Charcot's arthropathy 06/28/2022   Leukocytosis    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 Authorizing Provider  acetaminophen (TYLENOL) 325 MG tablet Take 1-2 tablets (325-650 mg total) by mouth every 6 (six) hours as needed for mild pain. 04/02/23   Setzer, Lynnell Jude, PA-C  artificial tears (LACRILUBE) OINT ophthalmic ointment Place into the right eye every 4 (four) hours as needed for dry eyes. 04/02/23   Setzer, Lynnell Jude, PA-C  ascorbic acid (VITAMIN C) 1000 MG tablet Take 1 tablet (1,000 mg total) by mouth daily. 03/19/23   Rolly Salter, MD  aspirin EC 81 MG tablet Take 1 tablet (81 mg total) by mouth  daily. Swallow whole. Patient not taking: Reported on 03/12/2023 03/10/23   Rhetta Mura, MD  atorvastatin (LIPITOR) 40 MG tablet Take 1 tablet (40 mg total) by mouth at bedtime. Patient not taking: Reported on 03/12/2023 03/10/23   Rhetta Mura, MD  clopidogrel (PLAVIX) 75 MG tablet Take 1 tablet (75 mg total) by mouth daily. Patient not taking: Reported on 03/12/2023 03/10/23 03/09/24  Rhetta Mura, MD   dorzolamide-timolol (COSOPT) 2-0.5 % ophthalmic solution Place 1 drop into the right eye 2 (two) times daily. Patient not taking: Reported on 03/12/2023 11/23/22   Adam Phenix, PA-C  doxycycline (VIBRA-TABS) 100 MG tablet Take 1 tablet (100 mg total) by mouth every 12 (twelve) hours. 04/02/23   Setzer, Lynnell Jude, PA-C  feeding supplement, GLUCERNA SHAKE, (GLUCERNA SHAKE) LIQD Take 237 mLs by mouth 3 (three) times daily between meals. 03/18/23   Rolly Salter, MD  gabapentin (NEURONTIN) 100 MG capsule Take 1 capsule (100 mg total) by mouth 3 (three) times daily. 03/18/23 04/17/23  Rolly Salter, MD  insulin aspart (NOVOLOG) 100 UNIT/ML injection Inject 0-9 Units into the skin 3 (three) times daily with meals. 04/02/23   Setzer, Lynnell Jude, PA-C  insulin glargine-yfgn (SEMGLEE) 100 UNIT/ML injection Inject 0.05 mLs (5 Units total) into the skin at bedtime. 04/02/23   Setzer, Lynnell Jude, PA-C  Insulin Pen Needle 32G X 4 MM MISC Use as directed with insulin pen 07/03/22   Rai, Ripudeep K, MD  levofloxacin (LEVAQUIN) 750 MG tablet Take 1 tablet (750 mg total) by mouth daily for 14 days. 04/02/23 04/16/23  Nadara Mustard, MD  magnesium chloride (SLOW-MAG) 64 MG TBEC SR tablet Take 1 tablet (64 mg total) by mouth 2 (two) times daily. 04/02/23   Setzer, Lynnell Jude, PA-C  Multiple Vitamin (MULTIVITAMIN WITH MINERALS) TABS tablet Take 1 tablet by mouth daily. 03/19/23   Rolly Salter, MD  nutrition supplement, Heinz Knuckles, Heinz Knuckles) PACK Take 1 packet by mouth 2 (two) times daily between meals. 03/18/23   Rolly Salter, MD  oxyCODONE (OXY IR/ROXICODONE) 5 MG immediate release tablet Take 1 tablet (5 mg total) by mouth every 6 (six) hours as needed for severe pain (pain score 7-10). 04/02/23   Setzer, Lynnell Jude, PA-C  pantoprazole (PROTONIX) 40 MG tablet Take 1 tablet (40 mg total) by mouth daily. 03/19/23   Rolly Salter, MD  polyethylene glycol (MIRALAX / GLYCOLAX) 17 g packet Take 17 g by mouth daily as needed for mild  constipation. 03/18/23   Rolly Salter, MD  zinc sulfate 220 (50 Zn) MG capsule Take 1 capsule (220 mg total) by mouth daily. 03/19/23   Rolly Salter, MD  glipiZIDE (GLUCOTROL) 5 MG tablet Take 1 tablet (5 mg total) by mouth daily before breakfast. Patient not taking: Reported on 01/14/2016 01/31/14 01/14/16  Christiane Ha, MD  Allergies Patient has no known allergies.  Review of Systems Review of Systems  HENT:  Positive for nosebleeds.    As noted in HPI  Physical Exam Vital Signs  I have reviewed the triage vital signs BP 103/64   Pulse 83   Temp 98.5 F (36.9 C)   Resp 18   SpO2 99%   Physical Exam Vitals reviewed.  Constitutional:      General: He is not in acute distress.    Appearance: He is well-developed. He is not diaphoretic.  HENT:     Head: Normocephalic and atraumatic.     Right Ear: External ear normal.     Left Ear: External ear normal.     Nose:     Right Nostril: Epistaxis (anterior) present.     Mouth/Throat:     Mouth: Mucous membranes are moist.  Eyes:     General: No scleral icterus.    Conjunctiva/sclera: Conjunctivae normal.  Neck:     Trachea: Phonation normal.  Cardiovascular:     Rate and Rhythm: Normal rate and regular rhythm.  Pulmonary:     Effort: Pulmonary effort is normal. No respiratory distress.     Breath sounds: No stridor.  Abdominal:     General: There is no distension.  Musculoskeletal:        General: Normal range of motion.     Cervical back: Normal range of motion.       Legs:  Feet:     Right foot:     Skin integrity: Ulcer present.  Neurological:     Mental Status: He is alert and oriented to person, place, and time.  Psychiatric:        Behavior: Behavior normal.     ED Results and Treatments Labs (all labs ordered are listed, but only abnormal results are displayed) Labs  Reviewed  CBC WITH DIFFERENTIAL/PLATELET - Abnormal; Notable for the following components:      Result Value   RBC 2.85 (*)    Hemoglobin 9.1 (*)    HCT 28.8 (*)    MCV 101.1 (*)    All other components within normal limits  BASIC METABOLIC PANEL - Abnormal; Notable for the following components:   Glucose, Bld 173 (*)    All other components within normal limits                                                                                                                         EKG  EKG Interpretation  Date/Time:    Ventricular Rate:    PR Interval:    QRS Duration:   QT Interval:    QTC Calculation:   R Axis:     Text Interpretation:         Radiology No results found.  Medications Ordered in ED Medications  tranexamic acid (CYKLOKAPRON) 1000 MG/10ML topical solution 500 mg (500 mg Topical Given 04/12/23 2357)   Procedures Procedures  (including critical care time) Medical Decision  Making / ED Course   Medical Decision Making Amount and/or Complexity of Data Reviewed Labs: ordered. Decision-making details documented in ED Course.    Right nare epistaxis.  Appears to be anterior.  Nasal packing placed with TXA resulting in hemostasis. CBC without leukocytosis.  Hemoglobin improved from priors.  No electrolyte or metabolic derangements.  No renal sufficiency. Patient monitored for 3 hours without recurrence of his bleed.     Final Clinical Impression(s) / ED Diagnoses Final diagnoses:  Anterior epistaxis   The patient appears reasonably screened and/or stabilized for discharge and I doubt any other medical condition or other [facility] requiring further screening, evaluation, or treatment in the ED at this time. I have discussed the findings, Dx and Tx plan with the patient/family who expressed understanding and agree(s) with the plan. Discharge instructions discussed at length. The patient/family was given strict return precautions who verbalized understanding of the  instructions. No further questions at time of discharge.  Disposition: Discharge  Condition: Good  ED Discharge Orders     None        Follow Up: Grayce Sessions, NP [address] [place] [address] [phone]  Call  as needed    This chart was dictated using voice recognition software.  Despite best efforts to proofread,  errors can occur which can change the documentation meaning.    Nira Conn, MD 04/13/23 0230

## 2023-04-13 LAB — CBC WITH DIFFERENTIAL/PLATELET
Abs Immature Granulocytes: 0.01 10*3/uL (ref 0.00–0.07)
Basophils Absolute: 0.1 10*3/uL (ref 0.0–0.1)
Basophils Relative: 1 %
Eosinophils Absolute: 0.3 10*3/uL (ref 0.0–0.5)
Eosinophils Relative: 5 %
HCT: 28.8 % — ABNORMAL LOW (ref 39.0–52.0)
Hemoglobin: 9.1 g/dL — ABNORMAL LOW (ref 13.0–17.0)
Immature Granulocytes: 0 %
Lymphocytes Relative: 36 %
Lymphs Abs: 2.3 10*3/uL (ref 0.7–4.0)
MCH: 31.9 pg (ref 26.0–34.0)
MCHC: 31.6 g/dL (ref 30.0–36.0)
MCV: 101.1 fL — ABNORMAL HIGH (ref 80.0–100.0)
Monocytes Absolute: 0.8 10*3/uL (ref 0.1–1.0)
Monocytes Relative: 11 %
Neutro Abs: 3.1 10*3/uL (ref 1.7–7.7)
Neutrophils Relative %: 47 %
Platelets: 263 10*3/uL (ref 150–400)
RBC: 2.85 MIL/uL — ABNORMAL LOW (ref 4.22–5.81)
RDW: 14.9 % (ref 11.5–15.5)
WBC: 6.6 10*3/uL (ref 4.0–10.5)
nRBC: 0 % (ref 0.0–0.2)

## 2023-04-13 LAB — BASIC METABOLIC PANEL
Anion gap: 7 (ref 5–15)
BUN: 15 mg/dL (ref 6–20)
CO2: 27 mmol/L (ref 22–32)
Calcium: 9.2 mg/dL (ref 8.9–10.3)
Chloride: 105 mmol/L (ref 98–111)
Creatinine, Ser: 0.93 mg/dL (ref 0.61–1.24)
GFR, Estimated: >60 mL/min (ref >60)
Glucose, Bld: 173 mg/dL — ABNORMAL HIGH (ref 70–99)
Potassium: 4.4 mmol/L (ref 3.5–5.1)
Sodium: 139 mmol/L (ref 135–145)

## 2023-04-13 NOTE — ED Notes (Signed)
[facility] called 

## 2023-04-13 NOTE — ED Notes (Signed)
Report given to [facility] , [facility] notified by secretary for transport .

## 2023-04-17 NOTE — Telephone Encounter (Signed)
Scheduled appointments with T[place] at [facility]

## 2023-04-22 ENCOUNTER — Other Ambulatory Visit: Payer: Self-pay | Admitting: *Deleted

## 2023-04-22 DIAGNOSIS — E11621 Type 2 diabetes mellitus with foot ulcer: Secondary | ICD-10-CM

## 2023-04-22 DIAGNOSIS — I739 Peripheral vascular disease, unspecified: Secondary | ICD-10-CM

## 2023-04-25 ENCOUNTER — Other Ambulatory Visit: Payer: Self-pay | Admitting: *Deleted

## 2023-04-28 NOTE — Telephone Encounter (Signed)
error 

## 2023-04-29 ENCOUNTER — Ambulatory Visit (INDEPENDENT_AMBULATORY_CARE_PROVIDER_SITE_OTHER): Payer: Medicaid Other | Admitting: Orthopedic Surgery

## 2023-04-29 DIAGNOSIS — L97511 Non-pressure chronic ulcer of other part of right foot limited to breakdown of skin: Secondary | ICD-10-CM

## 2023-04-29 DIAGNOSIS — S88112A Complete traumatic amputation at level between knee and ankle, left lower leg, initial encounter: Secondary | ICD-10-CM

## 2023-04-29 DIAGNOSIS — Z89512 Acquired absence of left leg below knee: Secondary | ICD-10-CM

## 2023-04-30 ENCOUNTER — Ambulatory Visit (INDEPENDENT_AMBULATORY_CARE_PROVIDER_SITE_OTHER)
Admission: RE | Admit: 2023-04-30 | Discharge: 2023-04-30 | Disposition: A | Discharge status: Home / Self Care | Payer: Medicaid Other | Source: Ambulatory Visit | Attending: Vascular Surgery | Admitting: Vascular Surgery

## 2023-04-30 ENCOUNTER — Ambulatory Visit (HOSPITAL_COMMUNITY)
Admission: RE | Admit: 2023-04-30 | Discharge: 2023-04-30 | Disposition: A | Discharge status: Home / Self Care | Payer: Medicaid Other | Source: Ambulatory Visit | Attending: Vascular Surgery | Admitting: Vascular Surgery

## 2023-04-30 DIAGNOSIS — E11621 Type 2 diabetes mellitus with foot ulcer: Secondary | ICD-10-CM | POA: Insufficient documentation

## 2023-04-30 DIAGNOSIS — L97511 Non-pressure chronic ulcer of other part of right foot limited to breakdown of skin: Secondary | ICD-10-CM

## 2023-04-30 DIAGNOSIS — I70201 Unspecified atherosclerosis of native arteries of extremities, right leg: Secondary | ICD-10-CM | POA: Diagnosis not present

## 2023-04-30 DIAGNOSIS — I739 Peripheral vascular disease, unspecified: Secondary | ICD-10-CM | POA: Diagnosis present

## 2023-05-02 ENCOUNTER — Telehealth: Payer: Self-pay

## 2023-05-02 NOTE — Telephone Encounter (Signed)
[facility] called asking for WTB status on the right. Advised there is still an open wound and that he can be transfer only until next office visit.

## 2023-05-06 ENCOUNTER — Ambulatory Visit (INDEPENDENT_AMBULATORY_CARE_PROVIDER_SITE_OTHER): Payer: Medicaid Other | Admitting: Physician Assistant

## 2023-05-06 VITALS — BP 148/83 | HR 88 | Temp 98.2°F | Resp 18 | Ht 72.0 in | Wt 196.2 lb

## 2023-05-06 DIAGNOSIS — Z89512 Acquired absence of left leg below knee: Secondary | ICD-10-CM

## 2023-05-06 DIAGNOSIS — I70235 Atherosclerosis of native arteries of right leg with ulceration of other part of foot: Secondary | ICD-10-CM

## 2023-05-06 DIAGNOSIS — I739 Peripheral vascular disease, unspecified: Secondary | ICD-10-CM

## 2023-05-06 NOTE — Progress Notes (Signed)
Office Note   History of Present Illness   Jesse [name] is a 62 y.o. ([birth year]) male who presents for follow-up.  He has a history of bilateral lower extremity ulcerations in the setting of PAD.  He underwent right lower extremity angiogram with right anterior tibial artery angioplasty by Dr. Chestine Spore on 03/06/2023.  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 Lajoyce Corners on 03/15/2023 and 03/28/2023 respectively.  He returns today for follow-up.  He has been doing well since his surgeries.  He is currently nonweightbearing on the right foot and mobilizes via wheelchair.  His left BKA has been healing well.  He saw Dr. Lajoyce Corners 2 weeks ago and was told that his right foot wounds were also healing.  He denies any pain in his right foot.  He denies any fevers, puslike drainage, or erythema of the foot.  He gets daily dressing changes to his right foot wounds at his SNF.  Current Outpatient Medications  Medication Sig Dispense Refill   acetaminophen (TYLENOL) 325 MG tablet Take 1-2 tablets (325-650 mg total) by mouth every 6 (six) hours as needed for mild pain.     artificial tears (LACRILUBE) OINT ophthalmic ointment Place into the right eye every 4 (four) hours as needed for dry eyes.     ascorbic acid (VITAMIN C) 1000 MG tablet Take 1 tablet (1,000 mg total) by mouth daily. 30 tablet 0   aspirin EC 81 MG tablet Take 1 tablet (81 mg total) by mouth daily. Swallow whole. (Patient not taking: Reported on 03/12/2023) 30 tablet 12   atorvastatin (LIPITOR) 40 MG tablet Take 1 tablet (40 mg total) by mouth at bedtime. (Patient not taking: Reported on 03/12/2023) 30 tablet 0   clopidogrel (PLAVIX) 75 MG tablet Take 1 tablet (75 mg total) by mouth daily. (Patient not taking: Reported on 03/12/2023) 30 tablet 11   dorzolamide-timolol (COSOPT) 2-0.5 % ophthalmic solution Place 1 drop into the right eye 2 (two) times daily. (Patient not taking: Reported on 03/12/2023) 10 mL 0   doxycycline (VIBRA-TABS)  100 MG tablet Take 1 tablet (100 mg total) by mouth every 12 (twelve) hours. 28 tablet 0   feeding supplement, GLUCERNA SHAKE, (GLUCERNA SHAKE) LIQD Take 237 mLs by mouth 3 (three) times daily between meals.  0   gabapentin (NEURONTIN) 100 MG capsule Take 1 capsule (100 mg total) by mouth 3 (three) times daily. 90 capsule 0   insulin aspart (NOVOLOG) 100 UNIT/ML injection Inject 0-9 Units into the skin 3 (three) times daily with meals. 10 mL 11   insulin glargine-yfgn (SEMGLEE) 100 UNIT/ML injection Inject 0.05 mLs (5 Units total) into the skin at bedtime. 10 mL 11   Insulin Pen Needle 32G X 4 MM MISC Use as directed with insulin pen 100 each 1   magnesium chloride (SLOW-MAG) 64 MG TBEC SR tablet Take 1 tablet (64 mg total) by mouth 2 (two) times daily. 60 tablet    Multiple Vitamin (MULTIVITAMIN WITH MINERALS) TABS tablet Take 1 tablet by mouth daily.     nutrition supplement, JUVEN, (JUVEN) PACK Take 1 packet by mouth 2 (two) times daily between meals.  0   oxyCODONE (OXY IR/ROXICODONE) 5 MG immediate release tablet Take 1 tablet (5 mg total) by mouth every 6 (six) hours as needed for severe pain (pain score 7-10). 20 tablet 0   pantoprazole (PROTONIX) 40 MG tablet Take 1 tablet (40 mg total) by mouth daily. 30 tablet 0  polyethylene glycol (MIRALAX / GLYCOLAX) 17 g packet Take 17 g by mouth daily as needed for mild constipation. 14 each 0   zinc sulfate 220 (50 Zn) MG capsule Take 1 capsule (220 mg total) by mouth daily. 30 capsule 0   No current facility-administered medications for this visit.    REVIEW OF SYSTEMS (negative unless checked):   Cardiac:  []  Chest pain or chest pressure? []  Shortness of breath upon activity? []  Shortness of breath when lying flat? []  Irregular heart rhythm?  Vascular:  []  Pain in calf, thigh, or hip brought on by walking? []  Pain in feet at night that wakes you up from your sleep? []  Blood clot in your veins? []  Leg swelling?  Pulmonary:  []  Oxygen  at home? []  Productive cough? []  Wheezing?  Neurologic:  []  Sudden weakness in arms or legs? []  Sudden numbness in arms or legs? []  Sudden onset of difficult speaking or slurred speech? []  Temporary loss of vision in one eye? []  Problems with dizziness?  Gastrointestinal:  []  Blood in stool? []  Vomited blood?  Genitourinary:  []  Burning when urinating? []  Blood in urine?  Psychiatric:  []  Major depression  Hematologic:  []  Bleeding problems? []  Problems with blood clotting?  Dermatologic:  []  Rashes or ulcers?  Constitutional:  []  Fever or chills?  Ear/Nose/Throat:  []  Change in hearing? []  Nose bleeds? []  Sore throat?  Musculoskeletal:  []  Back pain? []  Joint pain? []  Muscle pain?   Physical Examination   Vitals:   05/06/23 1018  BP: (!) 148/83  Pulse: 88  Resp: 18  Temp: 98.2 F (36.8 C)  TempSrc: Temporal  SpO2: 98%  Weight: 196 lb 3.4 oz (89 kg)  Height: 6' (1.829 m)   Body mass index is 26.61 kg/m.  General:  WDWN in NAD; vital signs documented above Gait: Not observed HENT: WNL, normocephalic Pulmonary: normal non-labored breathing , without rales, rhonchi,  wheezing Cardiac: regular Abdomen: soft, NT, no masses Skin: without rashes Vascular Exam/Pulses: Brisk right DP/PT Doppler signals Extremities: Right plantar foot wounds healing appropriately with no signs of infection or erythema Musculoskeletal: no muscle wasting or atrophy  Neurologic: A&O X 3;  No focal weakness or paresthesias are detected Psychiatric:  The pt has Normal affect.    Non-Invasive Vascular imaging   ABI (04/30/2023) R:  ABI: 1.16  PT: tri DP: tri TBI:  0.77  Left BKA  RLE Arterial Duplex (04/30/2023) Patent arterial flow in right lower extremity without hemodynamically significant stenosis.  Slightly elevated velocities of mid-AT at 300 PSV  Medical Decision Making   Jesse [name] is a 62 y.o. male who presents for follow-up s/p right lower  extremity angiogram with AT angioplasty on 03/06/2023  Based on the patient's vascular studies, his ABI on the right is 1.16.  He has triphasic tibial flow and a great toe pressure of 108 mmHg.  This should be sufficient for wound healing Duplex of his right lower extremity demonstrates patent arterial flow without hemodynamically significant stenosis.  There is a small area in the midportion of the AT with elevated velocities of 300.  Given that he has multiphasic flow distal to this, we will continue to monitor this area. After his angiogram he underwent left BKA and right foot debridement by Dr. Lajoyce Corners.  His left BKA and right foot is healing well.  There is healthy appearing tissue of the right foot. On exam he has brisk DP/PT Doppler signals on the right foot He should  continue daily dressing changes to the right foot and any other wound care recommended by Dr. Lajoyce Corners.  He can follow-up with our office in 6 months with repeat right lower extremity arterial duplex and ABIs.  I have instructed the patient he should return sooner if his right foot wound worsens   Loel Dubonnet PA-C [facility] Office: [phone]  Clinic MD: Steve Rattler

## 2023-05-08 ENCOUNTER — Encounter: Payer: Self-pay | Admitting: Orthopedic Surgery

## 2023-05-08 NOTE — Progress Notes (Signed)
Office Visit Note   Patient: Jesse [name]           Date of Birth: 11/11/1961           MRN: [id number] Visit Date: 04/29/2023              Requested by: Grayce Sessions, NP [address] [place],  [place] [address] PCP: Grayce Sessions, NP  Chief Complaint  Patient presents with   Left Leg - Routine Post Op    03/15/2023 left BKA    Right Foot - Routine Post Op      03/28/2023 right heel debridement         HPI: Patient is a 62 year old gentleman who is 4 weeks status post right heel debridement.  He is status post left transtibial amputation he is using a stump shrinker.  Assessment & Plan: Visit Diagnoses:  1. Non-pressure chronic ulcer of other part of right foot limited to breakdown of skin (HCC)   2. Below-knee amputation of left lower extremity, initial encounter [facility])     Plan: Patient is currently at skilled nursing continue dressing changes to the right heel.  Continue compression for the left transtibial amputation.  Follow-Up Instructions: Return in about 4 weeks (around 05/27/2023).   Ortho Exam  Patient is alert, oriented, no adenopathy, well-dressed, normal affect, normal respiratory effort. Examination of the left below-knee amputation the wound is healing there is swelling.  Venous and lymphatic swelling of both lower extremities.  The right heel ulcer is 4 x 8 cm with a flat granulation tissue.  Imaging: No results found.    Labs: Lab Results  Component Value Date   HGBA1C 6.9 (H) 11/21/2022   HGBA1C 6.3 10/10/2022   HGBA1C 8.3 (H) 06/20/2022   ESRSEDRATE 132 (H) 03/03/2023   ESRSEDRATE 137 (H) 07/08/2022   ESRSEDRATE 107 (H) 06/27/2022   CRP 14.1 (H) 07/08/2022   CRP 16.9 (H) 06/27/2022   CRP 27.2 (H) 04/08/2021   LABURIC 5.3 07/04/2022   LABURIC 7.0 01/14/2016   REPTSTATUS 03/31/2023 FINAL 03/28/2023   GRAMSTAIN  03/28/2023    FEW WBC PRESENT, PREDOMINANTLY PMN ABUNDANT GRAM POSITIVE COCCI IN PAIRS IN CHAINS IN  CLUSTERS ABUNDANT GRAM NEGATIVE RODS FEW GRAM NEGATIVE COCCOBACILLI Performed at [facility] Lab, [address], [place], [place] [address]    CULT (A) 03/28/2023    MULTIPLE ORGANISMS PRESENT, NONE PREDOMINANT NO STAPHYLOCOCCUS AUREUS ISOLATED NO GROUP A STREP (S.PYOGENES) ISOLATED MIXED ANAEROBIC FLORA PRESENT.  CALL LAB IF FURTHER IID REQUIRED.    LABORGA PROTEUS MIRABILIS 04/18/2021     Lab Results  Component Value Date   ALBUMIN 2.5 (L) 03/31/2023   ALBUMIN 2.3 (L) 03/27/2023   ALBUMIN 1.9 (L) 03/24/2023    Lab Results  Component Value Date   MG 1.6 (L) 03/31/2023   MG 1.9 03/18/2023   MG 1.6 (L) 03/17/2023   No results found for: "VD25OH"  No results found for: "PREALBUMIN"    Latest Ref Rng & Units 04/12/2023   11:45 PM 04/02/2023    5:54 AM 03/31/2023   11:16 AM  CBC EXTENDED  WBC 4.0 - 10.5 K/uL 6.6  10.0  10.3   RBC 4.22 - 5.81 MIL/uL 2.85  2.25  2.57   Hemoglobin 13.0 - 17.0 g/dL 9.1  7.1  8.1   HCT 86.5 - 52.0 % 28.8  22.2  26.3   Platelets 150 - 400 K/uL 263  340  400   NEUT# 1.7 -  7.7 K/uL 3.1     Lymph# 0.7 - 4.0 K/uL 2.3        There is no height or weight on file to calculate BMI.  Orders:  No orders of the defined types were placed in this encounter.  No orders of the defined types were placed in this encounter.    Procedures: No procedures performed  Clinical Data: No additional findings.  ROS:  All other systems negative, except as noted in the HPI. Review of Systems  Objective: Vital Signs: There were no vitals taken for this visit.  Specialty Comments:  No specialty comments available.  PMFS History: Patient Active Problem List   Diagnosis Date Noted   Constipation 03/31/2023   Cutaneous abscess of right foot 03/28/2023   Coping style affecting medical condition 03/21/2023   Osteomyelitis of left lower extremity (HCC) 03/18/2023   Gangrene of left foot (HCC) 03/12/2023   Renal insufficiency 03/12/2023   PVD  (peripheral vascular disease) (HCC) 03/12/2023   Hyponatremia 03/12/2023   Normocytic anemia 03/12/2023   Lymphedema 03/04/2023   Eye injury 11/21/2022   Hyperkalemia 07/10/2022   Chronic painful diabetic neuropathy (HCC) 07/10/2022   Metabolic acidosis, normal anion gap (NAG) 07/10/2022   Hypomagnesemia 07/10/2022   Charcot's arthropathy 06/28/2022   Leukocytosis    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)    Severe protein-calorie malnutrition (HCC)    Diabetic foot infection (HCC) 06/22/2022   Sepsis (HCC) 06/22/2022   Cellulitis in diabetic foot (HCC) 04/17/2021   Osteomyelitis of left foot (HCC) 04/14/2021   HTN (hypertension) 04/08/2021   HLD (hyperlipidemia) 04/08/2021   Alcohol abuse    Diabetic ulcer of right foot (HCC)    Diabetes mellitus type II, controlled (HCC) 01/27/2014   AKI (acute kidney injury) (HCC) 01/27/2014   Past Medical History:  Diagnosis Date   AKI (acute kidney injury) (HCC) 01/27/2014   Alcohol abuse    Anasarca    Bilateral leg edema 02/07/2021   Cellulitis    Cellulitis and abscess 01/26/2014   Cellulitis of right foot 04/17/2021   Dental abscess 01/26/2014   Diabetes mellitus type 2 in obese    Diabetic foot infection (HCC) 04/08/2021   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 2 diabetes mellitus, limited to breakdown of skin (HCC)    Facial cellulitis 01/26/2014   HTN (hypertension)    Hypokalemia 01/28/2014   Infected dental carries 01/26/2014   Leukocytosis 01/27/2014   Osteomyelitis (HCC) 04/14/2021   Right foot infection     History reviewed. No pertinent family history.  Past Surgical History:  Procedure Laterality Date   ABDOMINAL AORTOGRAM W/LOWER EXTREMITY N/A 03/06/2023   Procedure: ABDOMINAL AORTOGRAM W/LOWER EXTREMITY;  Surgeon: Cephus Shelling, MD;  [facility];  Service: Cardiovascular;  Laterality: N/A;   AMPUTATION Left 03/15/2023    Procedure: AMPUTATION BELOW KNEE;  Surgeon: Nadara Mustard, MD;  Location: [facility] OR;  Service: Orthopedics;  Laterality: Left;   I & D EXTREMITY Right 04/18/2021   Procedure: IRRIGATION AND DEBRIDEMENT OF FOOT;  Surgeon: Nadara Mustard, MD;  Location: [facility] OR;  Service: Orthopedics;  Laterality: Right;   I & D EXTREMITY Right 04/20/2021   Procedure: EXCISIONAL DEBRIDEMENT RIGHT FOOT, APPLICATION OF SKIN GRAFT;  Surgeon: Nadara Mustard, MD;  Location: [facility] OR;  Service: Orthopedics;  Laterality: Right;   I & D EXTREMITY Right 03/28/2023   Procedure: RIGHT HEEL DEBRIDEMENT;  Surgeon:  Nadara Mustard, MD;  Location: [facility] OR;  Service: Orthopedics;  Laterality: Right;   PERIPHERAL VASCULAR INTERVENTION  03/06/2023   Procedure: PERIPHERAL VASCULAR INTERVENTION;  Surgeon: Cephus Shelling, MD;  [facility];  Service: Cardiovascular;;   Social History   Occupational History   Occupation: Curator  Tobacco Use   Smoking status: Never   Smokeless tobacco: Never  Vaping Use   Vaping Use: Never used  Substance and Sexual Activity   Alcohol use: Not Currently    Alcohol/week: 2.0 standard drinks of alcohol    Types: 2 Cans of beer per week    Comment: Occasional   Drug use: No   Sexual activity: Not Currently    Partners: Female

## 2023-05-14 ENCOUNTER — Other Ambulatory Visit: Payer: Self-pay

## 2023-05-14 DIAGNOSIS — I739 Peripheral vascular disease, unspecified: Secondary | ICD-10-CM

## 2023-05-29 ENCOUNTER — Ambulatory Visit (INDEPENDENT_AMBULATORY_CARE_PROVIDER_SITE_OTHER): Payer: Medicaid Other | Admitting: Orthopedic Surgery

## 2023-05-29 DIAGNOSIS — L97511 Non-pressure chronic ulcer of other part of right foot limited to breakdown of skin: Secondary | ICD-10-CM

## 2023-05-29 DIAGNOSIS — S88112A Complete traumatic amputation at level between knee and ankle, left lower leg, initial encounter: Secondary | ICD-10-CM

## 2023-05-29 DIAGNOSIS — Z89512 Acquired absence of left leg below knee: Secondary | ICD-10-CM

## 2023-05-29 DIAGNOSIS — G5622 Lesion of ulnar nerve, left upper limb: Secondary | ICD-10-CM

## 2023-06-02 ENCOUNTER — Telehealth: Payer: Self-pay | Admitting: Orthopedic Surgery

## 2023-06-02 NOTE — Telephone Encounter (Signed)
Patient called. Says the place he is at, needs wound care orders. He is at [facility]. Fax is [phone]

## 2023-06-04 ENCOUNTER — Other Ambulatory Visit: Payer: Self-pay

## 2023-06-04 NOTE — Telephone Encounter (Signed)
Fax sent to number below to advise sock to maintain contact with the wound on the right foot with 4x4 and ace to absorb drainage on the outside of the sock  wash with dial soap and water daily and continue with the shrinker to the BKA.

## 2023-06-12 ENCOUNTER — Ambulatory Visit (INDEPENDENT_AMBULATORY_CARE_PROVIDER_SITE_OTHER): Payer: Medicaid Other | Admitting: Orthopedic Surgery

## 2023-06-12 DIAGNOSIS — S88112A Complete traumatic amputation at level between knee and ankle, left lower leg, initial encounter: Secondary | ICD-10-CM

## 2023-06-12 DIAGNOSIS — Z89512 Acquired absence of left leg below knee: Secondary | ICD-10-CM

## 2023-06-12 DIAGNOSIS — L97511 Non-pressure chronic ulcer of other part of right foot limited to breakdown of skin: Secondary | ICD-10-CM

## 2023-06-15 ENCOUNTER — Encounter: Payer: Self-pay | Admitting: Orthopedic Surgery

## 2023-06-15 NOTE — Progress Notes (Signed)
Office Visit Note   Patient: Jesse [name]           Date of Birth: 21-Feb-1961           MRN: [id number] Visit Date: 05/29/2023              Requested by: Grayce Sessions, NP [address] [place],  [place] [address] PCP: Grayce Sessions, NP  Chief Complaint  Patient presents with   Left Leg - Routine Post Op    03/15/2023 left BKA    Right Foot - Routine Post Op    03/28/2023 right heel debridement       HPI: Patient is a 62 year old gentleman who is status post left below-knee amputation May 4 and right heel debridement May 17.  Patient reports progressive clawing of the fingers of the left hand.  Assessment & Plan: Visit Diagnoses:  1. Below-knee amputation of left lower extremity, initial encounter (HCC)   2. Non-pressure chronic ulcer of other part of right foot limited to breakdown of skin (HCC)   3. Lesion of left ulnar nerve     Plan: Patient was provided a compression sock for the right lower extremity swelling and ulcer.  No intervention for the ulnar nerve neuropathy left hand.  Follow-Up Instructions: Return in about 2 weeks (around 06/12/2023).   Ortho Exam  Patient is alert, oriented, no adenopathy, well-dressed, normal affect, normal respiratory effort. Examination patient has an ulnar nerve neuropathy to the left hand.  He has weakness with grip strength the intrinsics are atrophied the thenar eminence is atrophied.  Patient has inability to abduct or adduct his fingers and cannot cross his fingers.  Examination the ulceration to the right heel has healed.  The venous ulcers have healed.  The wound on the right heel is 2 x 4 cm flat with healthy granulation tissue.  Patient has some indentations on the left transtibial amputation he states he has been keeping his wallet underneath the shrinker.  Imaging: No results found.   Labs: Lab Results  Component Value Date   HGBA1C 6.9 (H) 11/21/2022   HGBA1C 6.3 10/10/2022   HGBA1C 8.3 (H)  06/20/2022   ESRSEDRATE 132 (H) 03/03/2023   ESRSEDRATE 137 (H) 07/08/2022   ESRSEDRATE 107 (H) 06/27/2022   CRP 14.1 (H) 07/08/2022   CRP 16.9 (H) 06/27/2022   CRP 27.2 (H) 04/08/2021   LABURIC 5.3 07/04/2022   LABURIC 7.0 01/14/2016   REPTSTATUS 03/31/2023 FINAL 03/28/2023   GRAMSTAIN  03/28/2023    FEW WBC PRESENT, PREDOMINANTLY PMN ABUNDANT GRAM POSITIVE COCCI IN PAIRS IN CHAINS IN CLUSTERS ABUNDANT GRAM NEGATIVE RODS FEW GRAM NEGATIVE COCCOBACILLI Performed at [facility], [address], [place], [place] [address]    CULT (A) 03/28/2023    MULTIPLE ORGANISMS PRESENT, NONE PREDOMINANT NO STAPHYLOCOCCUS AUREUS ISOLATED NO GROUP A STREP (S.PYOGENES) ISOLATED MIXED ANAEROBIC FLORA PRESENT.  CALL LAB IF FURTHER IID REQUIRED.    LABORGA PROTEUS MIRABILIS 04/18/2021     Lab Results  Component Value Date   ALBUMIN 2.5 (L) 03/31/2023   ALBUMIN 2.3 (L) 03/27/2023   ALBUMIN 1.9 (L) 03/24/2023    Lab Results  Component Value Date   MG 1.6 (L) 03/31/2023   MG 1.9 03/18/2023   MG 1.6 (L) 03/17/2023   No results found for: "VD25OH"  No results found for: "PREALBUMIN"    Latest Ref Rng & Units 04/12/2023   11:45 PM 04/02/2023    5:54 AM  03/31/2023   11:16 AM  CBC EXTENDED  WBC 4.0 - 10.5 K/uL 6.6  10.0  10.3   RBC 4.22 - 5.81 MIL/uL 2.85  2.25  2.57   Hemoglobin 13.0 - 17.0 g/dL 9.1  7.1  8.1   HCT 78.2 - 52.0 % 28.8  22.2  26.3   Platelets 150 - 400 K/uL 263  340  400   NEUT# 1.7 - 7.7 K/uL 3.1     Lymph# 0.7 - 4.0 K/uL 2.3        There is no height or weight on file to calculate BMI.  Orders:  No orders of the defined types were placed in this encounter.  No orders of the defined types were placed in this encounter.    Procedures: No procedures performed  Clinical Data: No additional findings.  ROS:  All other systems negative, except as noted in the HPI. Review of Systems  Objective: Vital Signs: There were no vitals taken for this  visit.  Specialty Comments:  No specialty comments available.  PMFS History: Patient Active Problem List   Diagnosis Date Noted   Constipation 03/31/2023   Cutaneous abscess of right foot 03/28/2023   Coping style affecting medical condition 03/21/2023   Osteomyelitis of left lower extremity (HCC) 03/18/2023   Gangrene of left foot (HCC) 03/12/2023   Renal insufficiency 03/12/2023   PVD (peripheral vascular disease) (HCC) 03/12/2023   Hyponatremia 03/12/2023   Normocytic anemia 03/12/2023   Lymphedema 03/04/2023   Eye injury 11/21/2022   Hyperkalemia 07/10/2022   Chronic painful diabetic neuropathy (HCC) 07/10/2022   Metabolic acidosis, normal anion gap (NAG) 07/10/2022   Hypomagnesemia 07/10/2022   Charcot's arthropathy 06/28/2022   Leukocytosis    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)    Severe protein-calorie malnutrition (HCC)    Diabetic foot infection (HCC) 06/22/2022   Sepsis (HCC) 06/22/2022   Cellulitis in diabetic foot (HCC) 04/17/2021   Osteomyelitis of left foot (HCC) 04/14/2021   HTN (hypertension) 04/08/2021   HLD (hyperlipidemia) 04/08/2021   Alcohol abuse    Diabetic ulcer of right foot (HCC)    Diabetes mellitus type II, controlled (HCC) 01/27/2014   AKI (acute kidney injury) (HCC) 01/27/2014   Past Medical History:  Diagnosis Date   AKI (acute kidney injury) (HCC) 01/27/2014   Alcohol abuse    Anasarca    Bilateral leg edema 02/07/2021   Cellulitis    Cellulitis and abscess 01/26/2014   Cellulitis of right foot 04/17/2021   Dental abscess 01/26/2014   Diabetes mellitus type 2 in obese    Diabetic foot infection (HCC) 04/08/2021   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 2 diabetes mellitus, limited to breakdown of skin (HCC)    Facial cellulitis 01/26/2014   HTN (hypertension)    Hypokalemia 01/28/2014   Infected dental carries 01/26/2014   Leukocytosis 01/27/2014    Osteomyelitis (HCC) 04/14/2021   Right foot infection     History reviewed. No pertinent family history.  Past Surgical History:  Procedure Laterality Date   ABDOMINAL AORTOGRAM W/LOWER EXTREMITY N/A 03/06/2023   Procedure: ABDOMINAL AORTOGRAM W/LOWER EXTREMITY;  Surgeon: Cephus Shelling, MD;  [facility];  Service: Cardiovascular;  Laterality: N/A;   AMPUTATION Left 03/15/2023   Procedure: AMPUTATION BELOW KNEE;  Surgeon: Nadara Mustard, MD;  Location: [facility] OR;  Service: Orthopedics;  Laterality: Left;   I & D EXTREMITY Right 04/18/2021   Procedure:  IRRIGATION AND DEBRIDEMENT OF FOOT;  Surgeon: Nadara Mustard, MD;  Location: [facility] OR;  Service: Orthopedics;  Laterality: Right;   I & D EXTREMITY Right 04/20/2021   Procedure: EXCISIONAL DEBRIDEMENT RIGHT FOOT, APPLICATION OF SKIN GRAFT;  Surgeon: Nadara Mustard, MD;  Location: [facility] OR;  Service: Orthopedics;  Laterality: Right;   I & D EXTREMITY Right 03/28/2023   Procedure: RIGHT HEEL DEBRIDEMENT;  Surgeon: Nadara Mustard, MD;  Location: [facility] OR;  Service: Orthopedics;  Laterality: Right;   PERIPHERAL VASCULAR INTERVENTION  03/06/2023   Procedure: PERIPHERAL VASCULAR INTERVENTION;  Surgeon: Cephus Shelling, MD;  Location: [facility];  Service: Cardiovascular;;   Social History   Occupational History   Occupation: Curator  Tobacco Use   Smoking status: Never   Smokeless tobacco: Never  Vaping Use   Vaping status: Never Used  Substance and Sexual Activity   Alcohol use: Not Currently    Alcohol/week: 2.0 standard drinks of alcohol    Types: 2 Cans of beer per week    Comment: Occasional   Drug use: No   Sexual activity: Not Currently    Partners: Female

## 2023-06-26 ENCOUNTER — Encounter: Payer: Self-pay | Admitting: Orthopedic Surgery

## 2023-06-26 NOTE — Progress Notes (Signed)
Office Visit Note   Patient: Jesse [name]           Date of Birth: September 05, 1961           MRN: [id number] Visit Date: 06/12/2023              Requested by: Grayce Sessions, NP [address] [place],  [place] [address] PCP: Grayce Sessions, NP  Chief Complaint  Patient presents with   Left Leg - Routine Post Op    03/15/23 left BKA   Right Foot - Routine Post Op    03/28/23 right heel debridement      HPI: Patient is a 62 year old gentleman who is 3 months status post left below-knee amputation with a right heel ulcer.  Assessment & Plan: Visit Diagnoses:  1. Below-knee amputation of left lower extremity, initial encounter (HCC)   2. Non-pressure chronic ulcer of other part of right foot limited to breakdown of skin (HCC)     Plan: Patient will continue with dry dressings changes and the stump shrinker for the left transtibial amputation.  Follow-Up Instructions: Return in about 4 weeks (around 07/10/2023).   Ortho Exam  Patient is alert, oriented, no adenopathy, well-dressed, normal affect, normal respiratory effort. Examination there is superficial ulceration from swelling on the residual limb on the left.  On the right foot there is a 1 x 5 cm ulcer with healthy granulation tissue.  There is decreased swelling in the right leg.  Currently wearing compression sock.  Imaging: No results found.    Labs: Lab Results  Component Value Date   HGBA1C 6.9 (H) 11/21/2022   HGBA1C 6.3 10/10/2022   HGBA1C 8.3 (H) 06/20/2022   ESRSEDRATE 132 (H) 03/03/2023   ESRSEDRATE 137 (H) 07/08/2022   ESRSEDRATE 107 (H) 06/27/2022   CRP 14.1 (H) 07/08/2022   CRP 16.9 (H) 06/27/2022   CRP 27.2 (H) 04/08/2021   LABURIC 5.3 07/04/2022   LABURIC 7.0 01/14/2016   REPTSTATUS 03/31/2023 FINAL 03/28/2023   GRAMSTAIN  03/28/2023    FEW WBC PRESENT, PREDOMINANTLY PMN ABUNDANT GRAM POSITIVE COCCI IN PAIRS IN CHAINS IN CLUSTERS ABUNDANT GRAM NEGATIVE RODS FEW GRAM NEGATIVE  COCCOBACILLI Performed at [facility], [address], [place], [place] [address]    CULT (A) 03/28/2023    MULTIPLE ORGANISMS PRESENT, NONE PREDOMINANT NO STAPHYLOCOCCUS AUREUS ISOLATED NO GROUP A STREP (S.PYOGENES) ISOLATED MIXED ANAEROBIC FLORA PRESENT.  CALL LAB IF FURTHER IID REQUIRED.    LABORGA PROTEUS MIRABILIS 04/18/2021     Lab Results  Component Value Date   ALBUMIN 2.5 (L) 03/31/2023   ALBUMIN 2.3 (L) 03/27/2023   ALBUMIN 1.9 (L) 03/24/2023    Lab Results  Component Value Date   MG 1.6 (L) 03/31/2023   MG 1.9 03/18/2023   MG 1.6 (L) 03/17/2023   No results found for: "VD25OH"  No results found for: "PREALBUMIN"    Latest Ref Rng & Units 04/12/2023   11:45 PM 04/02/2023    5:54 AM 03/31/2023   11:16 AM  CBC EXTENDED  WBC 4.0 - 10.5 K/uL 6.6  10.0  10.3   RBC 4.22 - 5.81 MIL/uL 2.85  2.25  2.57   Hemoglobin 13.0 - 17.0 g/dL 9.1  7.1  8.1   HCT 46.9 - 52.0 % 28.8  22.2  26.3   Platelets 150 - 400 K/uL 263  340  400   NEUT# 1.7 - 7.7 K/uL 3.1     Lymph# 0.7 - 4.0 K/uL  2.3        There is no height or weight on file to calculate BMI.  Orders:  No orders of the defined types were placed in this encounter.  No orders of the defined types were placed in this encounter.    Procedures: No procedures performed  Clinical Data: No additional findings.  ROS:  All other systems negative, except as noted in the HPI. Review of Systems  Objective: Vital Signs: There were no vitals taken for this visit.  Specialty Comments:  No specialty comments available.  PMFS History: Patient Active Problem List   Diagnosis Date Noted   Constipation 03/31/2023   Cutaneous abscess of right foot 03/28/2023   Coping style affecting medical condition 03/21/2023   Osteomyelitis of left lower extremity (HCC) 03/18/2023   Gangrene of left foot (HCC) 03/12/2023   Renal insufficiency 03/12/2023   PVD (peripheral vascular disease) (HCC) 03/12/2023   Hyponatremia  03/12/2023   Normocytic anemia 03/12/2023   Lymphedema 03/04/2023   Eye injury 11/21/2022   Hyperkalemia 07/10/2022   Chronic painful diabetic neuropathy (HCC) 07/10/2022   Metabolic acidosis, normal anion gap (NAG) 07/10/2022   Hypomagnesemia 07/10/2022   Charcot's arthropathy 06/28/2022   Leukocytosis    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)    Severe protein-calorie malnutrition (HCC)    Diabetic foot infection (HCC) 06/22/2022   Sepsis (HCC) 06/22/2022   Cellulitis in diabetic foot (HCC) 04/17/2021   Osteomyelitis of left foot (HCC) 04/14/2021   HTN (hypertension) 04/08/2021   HLD (hyperlipidemia) 04/08/2021   Alcohol abuse    Diabetic ulcer of right foot (HCC)    Diabetes mellitus type II, controlled (HCC) 01/27/2014   AKI (acute kidney injury) (HCC) 01/27/2014   Past Medical History:  Diagnosis Date   AKI (acute kidney injury) (HCC) 01/27/2014   Alcohol abuse    Anasarca    Bilateral leg edema 02/07/2021   Cellulitis    Cellulitis and abscess 01/26/2014   Cellulitis of right foot 04/17/2021   Dental abscess 01/26/2014   Diabetes mellitus type 2 in obese    Diabetic foot infection (HCC) 04/08/2021   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 2 diabetes mellitus, limited to breakdown of skin (HCC)    Facial cellulitis 01/26/2014   HTN (hypertension)    Hypokalemia 01/28/2014   Infected dental carries 01/26/2014   Leukocytosis 01/27/2014   Osteomyelitis (HCC) 04/14/2021   Right foot infection     History reviewed. No pertinent family history.  Past Surgical History:  Procedure Laterality Date   ABDOMINAL AORTOGRAM W/LOWER EXTREMITY N/A 03/06/2023   Procedure: ABDOMINAL AORTOGRAM W/LOWER EXTREMITY;  Surgeon: Cephus Shelling, MD;  [facility];  Service: Cardiovascular;  Laterality: N/A;   AMPUTATION Left 03/15/2023   Procedure: AMPUTATION BELOW KNEE;  Surgeon: Nadara Mustard, MD;   Location: [facility] OR;  Service: Orthopedics;  Laterality: Left;   I & D EXTREMITY Right 04/18/2021   Procedure: IRRIGATION AND DEBRIDEMENT OF FOOT;  Surgeon: Nadara Mustard, MD;  Location: [facility] OR;  Service: Orthopedics;  Laterality: Right;   I & D EXTREMITY Right 04/20/2021   Procedure: EXCISIONAL DEBRIDEMENT RIGHT FOOT, APPLICATION OF SKIN GRAFT;  Surgeon: Nadara Mustard, MD;  Location: [facility] OR;  Service: Orthopedics;  Laterality: Right;   I & D EXTREMITY Right 03/28/2023   Procedure: RIGHT HEEL DEBRIDEMENT;  Surgeon: Nadara Mustard, MD;  Location: [facility] OR;  Service: Orthopedics;  Laterality: Right;   PERIPHERAL VASCULAR INTERVENTION  03/06/2023   Procedure: PERIPHERAL VASCULAR INTERVENTION;  Surgeon: Cephus Shelling, MD;  Location: [facility];  Service: Cardiovascular;;   Social History   Occupational History   Occupation: Curator  Tobacco Use   Smoking status: Never   Smokeless tobacco: Never  Vaping Use   Vaping status: Never Used  Substance and Sexual Activity   Alcohol use: Not Currently    Alcohol/week: 2.0 standard drinks of alcohol    Types: 2 Cans of beer per week    Comment: Occasional   Drug use: No   Sexual activity: Not Currently    Partners: Female

## 2023-07-09 ENCOUNTER — Encounter: Payer: Self-pay | Admitting: Family

## 2023-07-09 ENCOUNTER — Ambulatory Visit (INDEPENDENT_AMBULATORY_CARE_PROVIDER_SITE_OTHER): Payer: Medicaid Other | Admitting: Family

## 2023-07-09 DIAGNOSIS — S88112A Complete traumatic amputation at level between knee and ankle, left lower leg, initial encounter: Secondary | ICD-10-CM

## 2023-07-09 DIAGNOSIS — L97511 Non-pressure chronic ulcer of other part of right foot limited to breakdown of skin: Secondary | ICD-10-CM

## 2023-07-09 DIAGNOSIS — Z89512 Acquired absence of left leg below knee: Secondary | ICD-10-CM

## 2023-07-09 DIAGNOSIS — I890 Lymphedema, not elsewhere classified: Secondary | ICD-10-CM

## 2023-07-09 NOTE — Progress Notes (Signed)
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  He also has a plantar foot ulcer on the right this was debrided last on May 17 he has been having daily dose of cleansing and dry dressings on the right foot residing at [facility]  Patient is a new left transtibial  amputee.  Patient's current comorbidities are not expected to impact the ability to function with the prescribed prosthesis. Patient verbally communicates a strong desire to use a prosthesis. Patient currently requires mobility aids to ambulate without a prosthesis.  Expects not to use mobility aids with a new prosthesis.  Patient is a K2 level ambulator that will use a prosthesis to walk around their home and the community over low level environmental barriers.      Ortho Exam On examination left residual limb this is well-healed.  There is no gaping drainage or sign of infection.  The right foot has a plantar wound which is 4 cm x 8 mm filled in with flat granulation tissue this is debrided of surrounding nonviable hyperkeratotic tissue.  No erythema warmth drainage  Visit Diagnoses: No diagnosis found.  Plan: Continue shrinker on the left.  Continue compression stocking with direct skin contact on the right.  Plan to follow-up in the office in 3 weeks.  Given an order for prosthesis set up  Follow-Up Instructions: Return in about 22 days (around 07/31/2023).   Imaging: No results found.  Orders:  No orders of the defined types were placed in this encounter.  No orders of the defined types were placed in  this encounter.    PMFS History: Patient Active Problem List   Diagnosis Date Noted   Constipation 03/31/2023   Cutaneous abscess of right foot 03/28/2023   Coping style affecting medical condition 03/21/2023   Osteomyelitis of left lower extremity (HCC) 03/18/2023   Gangrene of left foot (HCC) 03/12/2023   Renal insufficiency 03/12/2023   PVD (peripheral vascular disease) (HCC) 03/12/2023   Hyponatremia 03/12/2023   Normocytic anemia 03/12/2023   Lymphedema 03/04/2023   Eye injury 11/21/2022   Hyperkalemia 07/10/2022   Chronic painful diabetic neuropathy (HCC) 07/10/2022   Metabolic acidosis, normal anion gap (NAG) 07/10/2022   Hypomagnesemia 07/10/2022   Charcot's arthropathy 06/28/2022   Leukocytosis    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)    Severe protein-calorie malnutrition (HCC)    Diabetic foot infection (HCC) 06/22/2022   Sepsis (HCC) 06/22/2022   Cellulitis in diabetic foot (HCC) 04/17/2021   Osteomyelitis of left foot (HCC) 04/14/2021   HTN (hypertension) 04/08/2021   HLD (hyperlipidemia) 04/08/2021   Alcohol abuse    Diabetic ulcer of right foot (HCC)    Diabetes mellitus type II, controlled (HCC) 01/27/2014   AKI (acute kidney injury) (HCC) 01/27/2014   Past Medical History:  Diagnosis Date   AKI (acute kidney injury) (HCC) 01/27/2014   Alcohol abuse  Anasarca    Bilateral leg edema 02/07/2021   Cellulitis    Cellulitis and abscess 01/26/2014   Cellulitis of right foot 04/17/2021   Dental abscess 01/26/2014   Diabetes mellitus type 2 in obese    Diabetic foot infection (HCC) 04/08/2021   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 2 diabetes mellitus, limited to breakdown of skin (HCC)    Facial cellulitis 01/26/2014   HTN (hypertension)    Hypokalemia 01/28/2014   Infected dental carries 01/26/2014   Leukocytosis 01/27/2014   Osteomyelitis (HCC) 04/14/2021   Right foot infection      History reviewed. No pertinent family history.  Past Surgical History:  Procedure Laterality Date   ABDOMINAL AORTOGRAM W/LOWER EXTREMITY N/A 03/06/2023   Procedure: ABDOMINAL AORTOGRAM W/LOWER EXTREMITY;  Surgeon: Cephus Shelling, MD;  [facility];  Service: Cardiovascular;  Laterality: N/A;   AMPUTATION Left 03/15/2023   Procedure: AMPUTATION BELOW KNEE;  Surgeon: Nadara Mustard, MD;  Location: [facility] OR;  Service: Orthopedics;  Laterality: Left;   I & D EXTREMITY Right 04/18/2021   Procedure: IRRIGATION AND DEBRIDEMENT OF FOOT;  Surgeon: Nadara Mustard, MD;  Location: [facility] OR;  Service: Orthopedics;  Laterality: Right;   I & D EXTREMITY Right 04/20/2021   Procedure: EXCISIONAL DEBRIDEMENT RIGHT FOOT, APPLICATION OF SKIN GRAFT;  Surgeon: Nadara Mustard, MD;  Location: [facility] OR;  Service: Orthopedics;  Laterality: Right;   I & D EXTREMITY Right 03/28/2023   Procedure: RIGHT HEEL DEBRIDEMENT;  Surgeon: Nadara Mustard, MD;  Location: [facility] OR;  Service: Orthopedics;  Laterality: Right;   PERIPHERAL VASCULAR INTERVENTION  03/06/2023   Procedure: PERIPHERAL VASCULAR INTERVENTION;  Surgeon: Cephus Shelling, MD;  [facility];  Service: Cardiovascular;;   Social History   Occupational History   Occupation: Curator  Tobacco Use   Smoking status: Never   Smokeless tobacco: Never  Vaping Use   Vaping status: Never Used  Substance and Sexual Activity   Alcohol use: Not Currently    Alcohol/week: 2.0 standard drinks of alcohol    Types: 2 Cans of beer per week    Comment: Occasional   Drug use: No   Sexual activity: Not Currently    Partners: Female

## 2023-07-28 ENCOUNTER — Telehealth: Payer: Self-pay | Admitting: Orthopedic Surgery

## 2023-07-28 NOTE — Telephone Encounter (Signed)
Patient called would like to know if someone will be calling him about his leg? His cb# [phone]

## 2023-07-29 NOTE — Telephone Encounter (Signed)
Emailed IT sales professional at [employer] about that status of this order. Awaiting reply back.

## 2023-07-30 NOTE — Telephone Encounter (Signed)
Pt has an apt with [facility] on 08/04/23.

## 2023-08-06 ENCOUNTER — Ambulatory Visit (INDEPENDENT_AMBULATORY_CARE_PROVIDER_SITE_OTHER): Payer: Medicaid Other | Admitting: Primary Care

## 2023-08-08 ENCOUNTER — Encounter: Payer: Self-pay | Admitting: Family

## 2023-08-08 ENCOUNTER — Ambulatory Visit (INDEPENDENT_AMBULATORY_CARE_PROVIDER_SITE_OTHER): Payer: Medicaid Other | Admitting: Family

## 2023-08-08 DIAGNOSIS — S88112A Complete traumatic amputation at level between knee and ankle, left lower leg, initial encounter: Secondary | ICD-10-CM

## 2023-08-08 DIAGNOSIS — R600 Localized edema: Secondary | ICD-10-CM | POA: Diagnosis not present

## 2023-08-08 DIAGNOSIS — L97511 Non-pressure chronic ulcer of other part of right foot limited to breakdown of skin: Secondary | ICD-10-CM | POA: Diagnosis not present

## 2023-08-08 DIAGNOSIS — Z89512 Acquired absence of left leg below knee: Secondary | ICD-10-CM | POA: Diagnosis not present

## 2023-08-08 NOTE — Progress Notes (Signed)
Post-Op Visit Note   Patient: Jesse [name]           Date of Birth: [birth year]           MRN: [id number] Visit Date: 08/08/2023 PCP: Grayce Sessions, NP  Chief Complaint:  Chief Complaint  Patient presents with   Left Leg - Follow-up    Hx BKA   Right Foot - Follow-up    HPI:  HPI The patient is a 62 year old gentleman who is currently residing at skilled nursing who comes in today status post right heel debridement May 17 as well as below-knee amputation on the left May 4th  He is working with [facility] for his prosthesis set up he currently has a liner that he is wearing on his left residual limb around-the-clock he is using foam dressings to the right heel and right leg Ortho Exam On examination left residual limb this is well consolidated well-healed on examination of the right heel the area of ulceration to the plantar aspect of his foot is now 2 and half centimeters by 5 mm filled in with granulation this is debrided of some surrounding hyperkeratotic tissue there is no bleeding or sign of infection the ulcers to the lateral right calf appear to be healed  Visit Diagnoses: No diagnosis found.  Plan: He will continue with current wound care continue with prosthesis set up follow-up in the office in 3 months  Follow-Up Instructions: No follow-ups on file.   Imaging: No results found.  Orders:  No orders of the defined types were placed in this encounter.  No orders of the defined types were placed in this encounter.    PMFS History: Patient Active Problem List   Diagnosis Date Noted   Constipation 03/31/2023   Cutaneous abscess of right foot 03/28/2023   Coping style affecting medical condition 03/21/2023   Osteomyelitis of left lower extremity (HCC) 03/18/2023   Gangrene of left foot (HCC) 03/12/2023   Renal insufficiency 03/12/2023   PVD (peripheral vascular disease) (HCC) 03/12/2023   Hyponatremia 03/12/2023   Normocytic anemia 03/12/2023    Lymphedema 03/04/2023   Eye injury 11/21/2022   Hyperkalemia 07/10/2022   Chronic painful diabetic neuropathy (HCC) 07/10/2022   Metabolic acidosis, normal anion gap (NAG) 07/10/2022   Hypomagnesemia 07/10/2022   Charcot's arthropathy 06/28/2022   Leukocytosis    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)    Severe protein-calorie malnutrition (HCC)    Diabetic foot infection (HCC) 06/22/2022   Sepsis (HCC) 06/22/2022   Cellulitis in diabetic foot (HCC) 04/17/2021   Osteomyelitis of left foot (HCC) 04/14/2021   HTN (hypertension) 04/08/2021   HLD (hyperlipidemia) 04/08/2021   Alcohol abuse    Diabetic ulcer of right foot (HCC)    Diabetes mellitus type II, controlled (HCC) 01/27/2014   AKI (acute kidney injury) (HCC) 01/27/2014   Past Medical History:  Diagnosis Date   AKI (acute kidney injury) (HCC) 01/27/2014   Alcohol abuse    Anasarca    Bilateral leg edema 02/07/2021   Cellulitis    Cellulitis and abscess 01/26/2014   Cellulitis of right foot 04/17/2021   Dental abscess 01/26/2014   Diabetes mellitus type 2 in obese    Diabetic foot infection (HCC) 04/08/2021   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 2 diabetes mellitus, limited to breakdown of skin (HCC)    Facial cellulitis 01/26/2014   HTN (hypertension)    Hypokalemia 01/28/2014  Infected dental carries 01/26/2014   Leukocytosis 01/27/2014   Osteomyelitis (HCC) 04/14/2021   Right foot infection     No family history on file.  Past Surgical History:  Procedure Laterality Date   ABDOMINAL AORTOGRAM W/LOWER EXTREMITY N/A 03/06/2023   Procedure: ABDOMINAL AORTOGRAM W/LOWER EXTREMITY;  Surgeon: Cephus Shelling, MD;  [facility];  Service: Cardiovascular;  Laterality: N/A;   AMPUTATION Left 03/15/2023   Procedure: AMPUTATION BELOW KNEE;  Surgeon: Nadara Mustard, MD;  Location: [facility] OR;  Service: Orthopedics;  Laterality: Left;   I & D  EXTREMITY Right 04/18/2021   Procedure: IRRIGATION AND DEBRIDEMENT OF FOOT;  Surgeon: Nadara Mustard, MD;  Location: [facility] OR;  Service: Orthopedics;  Laterality: Right;   I & D EXTREMITY Right 04/20/2021   Procedure: EXCISIONAL DEBRIDEMENT RIGHT FOOT, APPLICATION OF SKIN GRAFT;  Surgeon: Nadara Mustard, MD;  Location: [facility] OR;  Service: Orthopedics;  Laterality: Right;   I & D EXTREMITY Right 03/28/2023   Procedure: RIGHT HEEL DEBRIDEMENT;  Surgeon: Nadara Mustard, MD;  Location: [facility] OR;  Service: Orthopedics;  Laterality: Right;   PERIPHERAL VASCULAR INTERVENTION  03/06/2023   Procedure: PERIPHERAL VASCULAR INTERVENTION;  Surgeon: Cephus Shelling, MD;  [facility];  Service: Cardiovascular;;   Social History   Occupational History   Occupation: Curator  Tobacco Use   Smoking status: Never   Smokeless tobacco: Never  Vaping Use   Vaping status: Never Used  Substance and Sexual Activity   Alcohol use: Not Currently    Alcohol/week: 2.0 standard drinks of alcohol    Types: 2 Cans of beer per week    Comment: Occasional   Drug use: No   Sexual activity: Not Currently    Partners: Female

## 2023-09-05 ENCOUNTER — Ambulatory Visit (INDEPENDENT_AMBULATORY_CARE_PROVIDER_SITE_OTHER): Payer: Medicaid Other | Admitting: Family

## 2023-09-05 ENCOUNTER — Encounter: Payer: Self-pay | Admitting: Family

## 2023-09-05 DIAGNOSIS — Z89512 Acquired absence of left leg below knee: Secondary | ICD-10-CM

## 2023-09-05 DIAGNOSIS — I890 Lymphedema, not elsewhere classified: Secondary | ICD-10-CM

## 2023-09-05 DIAGNOSIS — S88112A Complete traumatic amputation at level between knee and ankle, left lower leg, initial encounter: Secondary | ICD-10-CM

## 2023-09-05 DIAGNOSIS — L97511 Non-pressure chronic ulcer of other part of right foot limited to breakdown of skin: Secondary | ICD-10-CM

## 2023-09-05 NOTE — Progress Notes (Signed)
Post-Op Visit Note   Patient: Jesse [name]           Date of Birth: October 22, 1961           MRN: [id number] Visit Date: 09/05/2023 PCP: Grayce Sessions, NP  Chief Complaint:  Chief Complaint  Patient presents with   Left Leg - Follow-up    Hx BKA    Right Foot - Follow-up    HPI:  HPI The patient is a 62 year old gentleman who is currently residing at [facility] comes in today in follow-up.  The left below-knee amputation is well-healed he plans to pick up his prosthesis in 2 weeks  He has been wearing his medical compression stocking on the right and is pleased with improvement in wounds on the right Ortho Exam On examination left residual limb this is well consolidated well-healed there is no open area no sign of infection on examination right lower extremity there are 2 small ulcers to the proximal shin from scratching.  Traumatic ulcer is filled in with bloody granulation.  There is no open area to the plantar aspect of the right foot this is healed no erythema  Visit Diagnoses: No diagnosis found.  Plan: Pleased with healing of all wounds he will proceed with prosthesis set up.  Follow-up in 3 months.  Follow-Up Instructions: No follow-ups on file.   Imaging: No results found.  Orders:  No orders of the defined types were placed in this encounter.  No orders of the defined types were placed in this encounter.    PMFS History: Patient Active Problem List   Diagnosis Date Noted   Constipation 03/31/2023   Cutaneous abscess of right foot 03/28/2023   Coping style affecting medical condition 03/21/2023   Osteomyelitis of left lower extremity (HCC) 03/18/2023   Gangrene of left foot (HCC) 03/12/2023   Renal insufficiency 03/12/2023   PVD (peripheral vascular disease) (HCC) 03/12/2023   Hyponatremia 03/12/2023   Normocytic anemia 03/12/2023   Lymphedema 03/04/2023   Eye injury 11/21/2022   Hyperkalemia 07/10/2022   Chronic painful diabetic neuropathy  (HCC) 07/10/2022   Metabolic acidosis, normal anion gap (NAG) 07/10/2022   Hypomagnesemia 07/10/2022   Charcot's arthropathy 06/28/2022   Leukocytosis    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)    Severe protein-calorie malnutrition (HCC)    Diabetic foot infection (HCC) 06/22/2022   Sepsis (HCC) 06/22/2022   Cellulitis in diabetic foot (HCC) 04/17/2021   Osteomyelitis of left foot (HCC) 04/14/2021   HTN (hypertension) 04/08/2021   HLD (hyperlipidemia) 04/08/2021   Alcohol abuse    Diabetic ulcer of right foot (HCC)    Diabetes mellitus type II, controlled (HCC) 01/27/2014   AKI (acute kidney injury) (HCC) 01/27/2014   Past Medical History:  Diagnosis Date   AKI (acute kidney injury) (HCC) 01/27/2014   Alcohol abuse    Anasarca    Bilateral leg edema 02/07/2021   Cellulitis    Cellulitis and abscess 01/26/2014   Cellulitis of right foot 04/17/2021   Dental abscess 01/26/2014   Diabetes mellitus type 2 in obese    Diabetic foot infection (HCC) 04/08/2021   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 2 diabetes mellitus, limited to breakdown of skin (HCC)    Facial cellulitis 01/26/2014   HTN (hypertension)    Hypokalemia 01/28/2014   Infected dental carries 01/26/2014   Leukocytosis 01/27/2014   Osteomyelitis (HCC) 04/14/2021   Right foot infection  History reviewed. No pertinent family history.  Past Surgical History:  Procedure Laterality Date   ABDOMINAL AORTOGRAM W/LOWER EXTREMITY N/A 03/06/2023   Procedure: ABDOMINAL AORTOGRAM W/LOWER EXTREMITY;  Surgeon: Cephus Shelling, MD;  [facility];  Service: Cardiovascular;  Laterality: N/A;   AMPUTATION Left 03/15/2023   Procedure: AMPUTATION BELOW KNEE;  Surgeon: Nadara Mustard, MD;  Location: [facility] OR;  Service: Orthopedics;  Laterality: Left;   I & D EXTREMITY Right 04/18/2021   Procedure: IRRIGATION AND DEBRIDEMENT OF FOOT;  Surgeon: Nadara Mustard, MD;  Location: [facility] OR;  Service: Orthopedics;  Laterality: Right;   I & D EXTREMITY Right 04/20/2021   Procedure: EXCISIONAL DEBRIDEMENT RIGHT FOOT, APPLICATION OF SKIN GRAFT;  Surgeon: Nadara Mustard, MD;  Location: [facility] OR;  Service: Orthopedics;  Laterality: Right;   I & D EXTREMITY Right 03/28/2023   Procedure: RIGHT HEEL DEBRIDEMENT;  Surgeon: Nadara Mustard, MD;  Location: [facility] OR;  Service: Orthopedics;  Laterality: Right;   PERIPHERAL VASCULAR INTERVENTION  03/06/2023   Procedure: PERIPHERAL VASCULAR INTERVENTION;  Surgeon: Cephus Shelling, MD;  [facility];  Service: Cardiovascular;;   Social History   Occupational History   Occupation: Curator  Tobacco Use   Smoking status: Never   Smokeless tobacco: Never  Vaping Use   Vaping status: Never Used  Substance and Sexual Activity   Alcohol use: Not Currently    Alcohol/week: 2.0 standard drinks of alcohol    Types: 2 Cans of beer per week    Comment: Occasional   Drug use: No   Sexual activity: Not Currently    Partners: Female

## 2023-09-08 ENCOUNTER — Ambulatory Visit (INDEPENDENT_AMBULATORY_CARE_PROVIDER_SITE_OTHER): Payer: Medicaid Other | Admitting: Orthopedic Surgery

## 2023-09-08 DIAGNOSIS — Z89512 Acquired absence of left leg below knee: Secondary | ICD-10-CM

## 2023-09-08 DIAGNOSIS — S88112A Complete traumatic amputation at level between knee and ankle, left lower leg, initial encounter: Secondary | ICD-10-CM

## 2023-09-09 ENCOUNTER — Encounter: Payer: Self-pay | Admitting: Orthopedic Surgery

## 2023-09-09 NOTE — Progress Notes (Signed)
Office Visit Note   Patient: Jesse [name]           Date of Birth: 1961/06/17           MRN: [id number] Visit Date: 09/08/2023              Requested by: Grayce Sessions, NP [address] [place],  [place] [address] PCP: Grayce Sessions, NP  Chief Complaint  Patient presents with   Left Leg - Follow-up      HPI: Patient is a 62 year old gentleman who is seen for a new ulcer over the end of the residual limb left below-knee amputation.  Patient states that he was instructed to toughen the skin by vigorously rubbing the residual limb on a hard surface.  Patient developed a new ulcer from this.  Assessment & Plan: Visit Diagnoses:  1. Below-knee amputation of left lower extremity, initial encounter [facility])     Plan: Recommended using the Vive wear compression sleeve with the tube compression sock on top.  Recommended avoiding mechanical friction.  Patient will follow-up with [facility] for a new prosthesis.  Follow-Up Instructions: No follow-ups on file.   Ortho Exam  Patient is alert, oriented, no adenopathy, well-dressed, normal affect, normal respiratory effort. Examination patient has a new blister ulcer over the residual limb left below-knee amputation from mechanical fraction.  There is no cellulitis no signs of infection.  The wound surface area is 2 cm in diameter.  Imaging: No results found. No images are attached to the encounter.  Labs: Lab Results  Component Value Date   HGBA1C 6.9 (H) 11/21/2022   HGBA1C 6.3 10/10/2022   HGBA1C 8.3 (H) 06/20/2022   ESRSEDRATE 132 (H) 03/03/2023   ESRSEDRATE 137 (H) 07/08/2022   ESRSEDRATE 107 (H) 06/27/2022   CRP 14.1 (H) 07/08/2022   CRP 16.9 (H) 06/27/2022   CRP 27.2 (H) 04/08/2021   LABURIC 5.3 07/04/2022   LABURIC 7.0 01/14/2016   REPTSTATUS 03/31/2023 FINAL 03/28/2023   GRAMSTAIN  03/28/2023    FEW WBC PRESENT, PREDOMINANTLY PMN ABUNDANT GRAM POSITIVE COCCI IN PAIRS IN CHAINS IN CLUSTERS ABUNDANT GRAM  NEGATIVE RODS FEW GRAM NEGATIVE COCCOBACILLI Performed at [facility], [address] [place], [place] [address]    CULT (A) 03/28/2023    MULTIPLE ORGANISMS PRESENT, NONE PREDOMINANT NO STAPHYLOCOCCUS AUREUS ISOLATED NO GROUP A STREP (S.PYOGENES) ISOLATED MIXED ANAEROBIC FLORA PRESENT.  CALL LAB IF FURTHER IID REQUIRED.    LABORGA PROTEUS MIRABILIS 04/18/2021     Lab Results  Component Value Date   ALBUMIN 2.5 (L) 03/31/2023   ALBUMIN 2.3 (L) 03/27/2023   ALBUMIN 1.9 (L) 03/24/2023    Lab Results  Component Value Date   MG 1.6 (L) 03/31/2023   MG 1.9 03/18/2023   MG 1.6 (L) 03/17/2023   No results found for: "VD25OH"  No results found for: "PREALBUMIN"    Latest Ref Rng & Units 04/12/2023   11:45 PM 04/02/2023    5:54 AM 03/31/2023   11:16 AM  CBC EXTENDED  WBC 4.0 - 10.5 K/uL 6.6  10.0  10.3   RBC 4.22 - 5.81 MIL/uL 2.85  2.25  2.57   Hemoglobin 13.0 - 17.0 g/dL 9.1  7.1  8.1   HCT 29.5 - 52.0 % 28.8  22.2  26.3   Platelets 150 - 400 K/uL 263  340  400   NEUT# 1.7 - 7.7 K/uL 3.1     Lymph# 0.7 - 4.0 K/uL 2.3  There is no height or weight on file to calculate BMI.  Orders:  No orders of the defined types were placed in this encounter.  No orders of the defined types were placed in this encounter.    Procedures: No procedures performed  Clinical Data: No additional findings.  ROS:  All other systems negative, except as noted in the HPI. Review of Systems  Objective: Vital Signs: There were no vitals taken for this visit.  Specialty Comments:  No specialty comments available.  PMFS History: Patient Active Problem List   Diagnosis Date Noted   Constipation 03/31/2023   Cutaneous abscess of right foot 03/28/2023   Coping style affecting medical condition 03/21/2023   Osteomyelitis of left lower extremity (HCC) 03/18/2023   Gangrene of left foot (HCC) 03/12/2023   Renal insufficiency 03/12/2023   PVD (peripheral vascular disease)  (HCC) 03/12/2023   Hyponatremia 03/12/2023   Normocytic anemia 03/12/2023   Lymphedema 03/04/2023   Eye injury 11/21/2022   Hyperkalemia 07/10/2022   Chronic painful diabetic neuropathy (HCC) 07/10/2022   Metabolic acidosis, normal anion gap (NAG) 07/10/2022   Hypomagnesemia 07/10/2022   Charcot's arthropathy 06/28/2022   Leukocytosis    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)    Severe protein-calorie malnutrition (HCC)    Diabetic foot infection (HCC) 06/22/2022   Sepsis (HCC) 06/22/2022   Cellulitis in diabetic foot (HCC) 04/17/2021   Osteomyelitis of left foot (HCC) 04/14/2021   HTN (hypertension) 04/08/2021   HLD (hyperlipidemia) 04/08/2021   Alcohol abuse    Diabetic ulcer of right foot (HCC)    Diabetes mellitus type II, controlled (HCC) 01/27/2014   AKI (acute kidney injury) (HCC) 01/27/2014   Past Medical History:  Diagnosis Date   AKI (acute kidney injury) (HCC) 01/27/2014   Alcohol abuse    Anasarca    Bilateral leg edema 02/07/2021   Cellulitis    Cellulitis and abscess 01/26/2014   Cellulitis of right foot 04/17/2021   Dental abscess 01/26/2014   Diabetes mellitus type 2 in obese    Diabetic foot infection (HCC) 04/08/2021   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 2 diabetes mellitus, limited to breakdown of skin (HCC)    Facial cellulitis 01/26/2014   HTN (hypertension)    Hypokalemia 01/28/2014   Infected dental carries 01/26/2014   Leukocytosis 01/27/2014   Osteomyelitis (HCC) 04/14/2021   Right foot infection     History reviewed. No pertinent family history.  Past Surgical History:  Procedure Laterality Date   ABDOMINAL AORTOGRAM W/LOWER EXTREMITY N/A 03/06/2023   Procedure: ABDOMINAL AORTOGRAM W/LOWER EXTREMITY;  Surgeon: Cephus Shelling, MD;  [facility];  Service: Cardiovascular;  Laterality: N/A;   AMPUTATION Left 03/15/2023   Procedure: AMPUTATION BELOW KNEE;   Surgeon: Nadara Mustard, MD;  Location: [facility] OR;  Service: Orthopedics;  Laterality: Left;   I & D EXTREMITY Right 04/18/2021   Procedure: IRRIGATION AND DEBRIDEMENT OF FOOT;  Surgeon: Nadara Mustard, MD;  Location: [facility] OR;  Service: Orthopedics;  Laterality: Right;   I & D EXTREMITY Right 04/20/2021   Procedure: EXCISIONAL DEBRIDEMENT RIGHT FOOT, APPLICATION OF SKIN GRAFT;  Surgeon: Nadara Mustard, MD;  Location: [facility] OR;  Service: Orthopedics;  Laterality: Right;   I & D EXTREMITY Right 03/28/2023   Procedure: RIGHT HEEL DEBRIDEMENT;  Surgeon: Nadara Mustard, MD;  Location: [facility] OR;  Service: Orthopedics;  Laterality: Right;   PERIPHERAL VASCULAR INTERVENTION  03/06/2023   Procedure: PERIPHERAL VASCULAR INTERVENTION;  Surgeon: Cephus Shelling, MD;  Location: [facility] INVASIVE CV LAB;  Service: Cardiovascular;;   Social History   Occupational History   Occupation: Curator  Tobacco Use   Smoking status: Never   Smokeless tobacco: Never  Vaping Use   Vaping status: Never Used  Substance and Sexual Activity   Alcohol use: Not Currently    Alcohol/week: 2.0 standard drinks of alcohol    Types: 2 Cans of beer per week    Comment: Occasional   Drug use: No   Sexual activity: Not Currently    Partners: Female

## 2023-09-15 ENCOUNTER — Emergency Department (HOSPITAL_COMMUNITY)
Admission: EM | Admit: 2023-09-15 | Discharge: 2023-09-16 | Disposition: A | Discharge status: Home / Self Care | Payer: Medicaid Other | Attending: Emergency Medicine | Admitting: Emergency Medicine

## 2023-09-15 DIAGNOSIS — Z7982 Long term (current) use of aspirin: Secondary | ICD-10-CM | POA: Diagnosis not present

## 2023-09-15 DIAGNOSIS — Z7902 Long term (current) use of antithrombotics/antiplatelets: Secondary | ICD-10-CM | POA: Diagnosis not present

## 2023-09-15 DIAGNOSIS — R040 Epistaxis: Secondary | ICD-10-CM | POA: Diagnosis present

## 2023-09-15 LAB — CBC
HCT: 37.5 % — ABNORMAL LOW (ref 39.0–52.0)
Hemoglobin: 12.3 g/dL — ABNORMAL LOW (ref 13.0–17.0)
MCH: 30.9 pg (ref 26.0–34.0)
MCHC: 32.8 g/dL (ref 30.0–36.0)
MCV: 94.2 fL (ref 80.0–100.0)
Platelets: 216 10*3/uL (ref 150–400)
RBC: 3.98 MIL/uL — ABNORMAL LOW (ref 4.22–5.81)
RDW: 14.9 % (ref 11.5–15.5)
WBC: 7.1 10*3/uL (ref 4.0–10.5)
nRBC: 0 % (ref 0.0–0.2)

## 2023-09-15 LAB — COMPREHENSIVE METABOLIC PANEL
ALT: 23 U/L (ref 0–44)
AST: 25 U/L (ref 15–41)
Albumin: 3.4 g/dL — ABNORMAL LOW (ref 3.5–5.0)
Alkaline Phosphatase: 98 U/L (ref 38–126)
Anion gap: 8 (ref 5–15)
BUN: 39 mg/dL — ABNORMAL HIGH (ref 6–20)
CO2: 20 mmol/L — ABNORMAL LOW (ref 22–32)
Calcium: 9.5 mg/dL (ref 8.9–10.3)
Chloride: 110 mmol/L (ref 98–111)
Creatinine, Ser: 1.34 mg/dL — ABNORMAL HIGH (ref 0.61–1.24)
GFR, Estimated: >60 mL/min (ref >60)
Glucose, Bld: 142 mg/dL — ABNORMAL HIGH (ref 70–99)
Potassium: 4.3 mmol/L (ref 3.5–5.1)
Sodium: 138 mmol/L (ref 135–145)
Total Bilirubin: 0.5 mg/dL (ref <1.2)
Total Protein: 7.8 g/dL (ref 6.5–8.1)

## 2023-09-15 MED ORDER — LIDOCAINE HCL 4 % EX SOLN
Freq: Once | CUTANEOUS | Status: AC
  Filled 2023-09-15: qty 50

## 2023-09-15 MED ORDER — SILVER NITRATE-POT NITRATE 75-25 % EX MISC
1.0000 | Freq: Once | CUTANEOUS | Status: AC
  Administered 2023-09-15: 1 via TOPICAL
  Filled 2023-09-15: qty 1

## 2023-09-15 MED ORDER — OXYMETAZOLINE HCL 0.05 % NA SOLN
1.0000 | Freq: Once | NASAL | Status: AC
  Administered 2023-09-15: 1 via NASAL
  Filled 2023-09-15: qty 30

## 2023-09-15 NOTE — ED Triage Notes (Addendum)
Pt to ED via [facility] from [facility] rehab. Pt states he was picking his nose last night and nose started bleeding. Staff at facility gave afrin last night and bleeding stopped for night. Bleeding started again this morning, staff gave afrin again, and bleeding was controlled briefly this morning, then began again and has not stopped bleeding again since. Pt has stuffed nose with tissue to help control bleeding. Pt spitting blood out of mouth upon arrival to ED, airway is intact. Pt in no acute distress. Pt is on plavix. Pt has hx of HTN.    EMS: 186/108 94 HR 16 RR 97% RA 176 CBG

## 2023-09-15 NOTE — ED Provider Triage Note (Signed)
Emergency Medicine Provider Triage Evaluation Note  Jesse [name] , a 62 y.o. male  was evaluated in triage.  Pt complains of nosebleed.  Review of Systems  Positive: Nosebleed Negative: Lightheadedness  Physical Exam  BP (!) 171/95 (BP Location: Right Arm)   Pulse 98   Temp (!) 97.5 F (36.4 C) (Oral)   Resp 16   SpO2 100%  Patient with nosebleed since last night.  Reportedly on Plavix.  Has makeshift packing in both nostrils.  Does have some blood in the posterior pharynx.  Will need room.  Medical Decision Making  Medically screening exam initiated at 5:46 PM.  Appropriate orders placed.  SAKETH DAUBERT was informed that the remainder of the evaluation will be completed by another provider, this initial triage assessment does not replace that evaluation, and the importance of remaining in the ED until their evaluation is complete.  Epistaxis and will require room.  Will check basic blood work.   Benjiman Core, MD 09/15/23 1747

## 2023-09-15 NOTE — ED Notes (Signed)
[facility] called for transport back to [facility] .  

## 2023-09-15 NOTE — ED Provider Notes (Signed)
[place] EMERGENCY DEPARTMENT AT [facility] Provider Note   CSN: [id number] Arrival date & time: 09/15/23  1736     History  Chief Complaint  Patient presents with   Epistaxis    Jesse [name] is a 62 y.o. male.   Epistaxis Patient presents with nosebleed.  Started last night.  Started on the right side but now having both sides somewhat down the back of his throat.  Is on Plavix but does not know why.  Previous lower extremity amputations potentially the cause.  No other bleeding.  Has had previous epistaxis.      Home Medications Prior to Admission medications   Medication Sig Start Date End Date Taking? Authorizing Provider  acetaminophen (TYLENOL) 325 MG tablet Take 1-2 tablets (325-650 mg total) by mouth every 6 (six) hours as needed for mild pain. 04/02/23   Setzer, Lynnell Jude, PA-C  artificial tears (LACRILUBE) OINT ophthalmic ointment Place into the right eye every 4 (four) hours as needed for dry eyes. 04/02/23   Setzer, Lynnell Jude, PA-C  ascorbic acid (VITAMIN C) 1000 MG tablet Take 1 tablet (1,000 mg total) by mouth daily. 03/19/23   Rolly Salter, MD  aspirin EC 81 MG tablet Take 1 tablet (81 mg total) by mouth daily. Swallow whole. Patient not taking: Reported on 03/12/2023 03/10/23   Rhetta Mura, MD  atorvastatin (LIPITOR) 40 MG tablet Take 1 tablet (40 mg total) by mouth at bedtime. Patient not taking: Reported on 03/12/2023 03/10/23   Rhetta Mura, MD  clopidogrel (PLAVIX) 75 MG tablet Take 1 tablet (75 mg total) by mouth daily. Patient not taking: Reported on 03/12/2023 03/10/23 03/09/24  Rhetta Mura, MD  dorzolamide-timolol (COSOPT) 2-0.5 % ophthalmic solution Place 1 drop into the right eye 2 (two) times daily. Patient not taking: Reported on 03/12/2023 11/23/22   Adam Phenix, PA-C  doxycycline (VIBRA-TABS) 100 MG tablet Take 1 tablet (100 mg total) by mouth every 12 (twelve) hours. 04/02/23   Setzer, Lynnell Jude, PA-C  feeding  supplement, GLUCERNA SHAKE, (GLUCERNA SHAKE) LIQD Take 237 mLs by mouth 3 (three) times daily between meals. 03/18/23   Rolly Salter, MD  gabapentin (NEURONTIN) 100 MG capsule Take 1 capsule (100 mg total) by mouth 3 (three) times daily. 03/18/23 04/17/23  Rolly Salter, MD  insulin aspart (NOVOLOG) 100 UNIT/ML injection Inject 0-9 Units into the skin 3 (three) times daily with meals. 04/02/23   Setzer, Lynnell Jude, PA-C  insulin glargine-yfgn (SEMGLEE) 100 UNIT/ML injection Inject 0.05 mLs (5 Units total) into the skin at bedtime. 04/02/23   Setzer, Lynnell Jude, PA-C  Insulin Pen Needle 32G X 4 MM MISC Use as directed with insulin pen 07/03/22   Rai, Ripudeep K, MD  magnesium chloride (SLOW-MAG) 64 MG TBEC SR tablet Take 1 tablet (64 mg total) by mouth 2 (two) times daily. 04/02/23   Setzer, Lynnell Jude, PA-C  Multiple Vitamin (MULTIVITAMIN WITH MINERALS) TABS tablet Take 1 tablet by mouth daily. 03/19/23   Rolly Salter, MD  nutrition supplement, Heinz Knuckles, Heinz Knuckles) PACK Take 1 packet by mouth 2 (two) times daily between meals. 03/18/23   Rolly Salter, MD  oxyCODONE (OXY IR/ROXICODONE) 5 MG immediate release tablet Take 1 tablet (5 mg total) by mouth every 6 (six) hours as needed for severe pain (pain score 7-10). 04/02/23   Setzer, Lynnell Jude, PA-C  pantoprazole (PROTONIX) 40 MG tablet Take 1 tablet (40 mg total) by mouth daily. 03/19/23   Lynden Oxford  M, MD  polyethylene glycol (MIRALAX / GLYCOLAX) 17 g packet Take 17 g by mouth daily as needed for mild constipation. 03/18/23   Rolly Salter, MD  zinc sulfate 220 (50 Zn) MG capsule Take 1 capsule (220 mg total) by mouth daily. 03/19/23   Rolly Salter, MD  glipiZIDE (GLUCOTROL) 5 MG tablet Take 1 tablet (5 mg total) by mouth daily before breakfast. Patient not taking: Reported on 01/14/2016 01/31/14 01/14/16  Christiane Ha, MD      Allergies    Patient has no known allergies.    Review of Systems   Review of Systems  HENT:  Positive for nosebleeds.      Physical Exam Updated Vital Signs BP (!) 151/82 (BP Location: Right Arm)   Pulse 91   Temp 97.8 F (36.6 C) (Oral)   Resp 16   SpO2 97%  Physical Exam Vitals and nursing note reviewed.  HENT:     Nose:     Comments: Bleeding has decreased spontaneously.  Does have blood posterior pharynx and in bilateral nostrils. Musculoskeletal:     Cervical back: Neck supple.  Neurological:     Mental Status: He is alert.     ED Results / Procedures / Treatments   Labs (all labs ordered are listed, but only abnormal results are displayed) Labs Reviewed  COMPREHENSIVE METABOLIC PANEL - Abnormal; Notable for the following components:      Result Value   CO2 20 (*)    Glucose, Bld 142 (*)    BUN 39 (*)    Creatinine, Ser 1.34 (*)    Albumin 3.4 (*)    All other components within normal limits  CBC - Abnormal; Notable for the following components:   RBC 3.98 (*)    Hemoglobin 12.3 (*)    HCT 37.5 (*)    All other components within normal limits    EKG None  Radiology No results found.  Procedures .Epistaxis Management  Date/Time: 09/15/2023 7:36 PM  Performed by: Benjiman Core, MD Authorized by: Benjiman Core, MD   Consent:    Consent obtained:  Verbal   Consent given by:  Patient Universal protocol:    Patient identity confirmed:  Verbally with patient Anesthesia:    Anesthesia method:  Topical application   Topical anesthetic:  Lidocaine gel Procedure details:    Treatment site:  R posterior   Treatment method:  Silver nitrate   Treatment episode: recurring   Post-procedure details:    Assessment:  Bleeding decreased Comments:     Required second use of silver nitrate which appears show stop the bleeding for now.       Medications Ordered in ED Medications  oxymetazoline (AFRIN) 0.05 % nasal spray 1 spray (1 spray Each Nare Given 09/15/23 1949)  lidocaine (XYLOCAINE) 4 % external solution ( Topical Given 09/15/23 1949)  silver nitrate applicators  applicator 1 Application (1 Application Topical Given 09/15/23 1949)    ED Course/ Medical Decision Making/ A&P                                 Medical Decision Making Amount and/or Complexity of Data Reviewed Labs: ordered.  Risk OTC drugs. Prescription drug management.   Patient with epistaxis.  Differential diagnose includes posterior and anterior bleeding.  I think most likely right side anterior.  Blood work reassuring.  Creatinine mildly increased.  Hemoglobin reassuring.  Cauterized with silver nitrate  on the right side.        Final Clinical Impression(s) / ED Diagnoses Final diagnoses:  Anterior epistaxis    Rx / DC Orders ED Discharge Orders     None         Benjiman Core, MD 09/15/23 2102

## 2023-09-15 NOTE — Discharge Instructions (Addendum)
Hold pressure if bleeding returns.  Return to the ER if cannot be controlled.  Since you have had recurrent bleeds follow-up with ENT.

## 2023-09-16 ENCOUNTER — Telehealth: Payer: Self-pay

## 2023-09-16 NOTE — ED Notes (Signed)
Attempted to call report to [facility], no answer.  

## 2023-09-16 NOTE — Telephone Encounter (Signed)
Called but unable to leave vm due to it not being setup. Calling to schedule ED Consult follow up with Dr. Allena Katz.

## 2023-10-06 ENCOUNTER — Telehealth: Payer: Self-pay | Admitting: Orthopedic Surgery

## 2023-10-06 NOTE — Telephone Encounter (Signed)
Pt stopped in office stating he went to [facility] to check on status of prostatic left leg and they explained to him he need referral from Dr Lajoyce Corners. Please fax referral for left prosthetic leg to [facility] on [facility] [address] [place]. Pt phone number is [phone]. Please call pt about this matter

## 2023-10-06 NOTE — Telephone Encounter (Signed)
Can you write rx for this please?

## 2023-10-07 NOTE — Telephone Encounter (Signed)
done

## 2023-10-13 ENCOUNTER — Telehealth: Payer: Self-pay | Admitting: Orthopedic Surgery

## 2023-10-13 NOTE — Telephone Encounter (Signed)
Jesse [name] had an open spot at 2pm on Wednesday and I scheduled pt at that time. Pt informed.

## 2023-10-13 NOTE — Telephone Encounter (Signed)
Patient called triage phone and left message requesting an appointment this week for his leg wound.  Call back is [phone]

## 2023-10-15 ENCOUNTER — Ambulatory Visit: Payer: Medicaid Other | Admitting: Family

## 2023-10-16 ENCOUNTER — Ambulatory Visit (INDEPENDENT_AMBULATORY_CARE_PROVIDER_SITE_OTHER): Payer: Medicaid Other | Admitting: Orthopedic Surgery

## 2023-10-16 DIAGNOSIS — S88112A Complete traumatic amputation at level between knee and ankle, left lower leg, initial encounter: Secondary | ICD-10-CM

## 2023-10-16 DIAGNOSIS — Z89512 Acquired absence of left leg below knee: Secondary | ICD-10-CM | POA: Diagnosis not present

## 2023-10-20 ENCOUNTER — Ambulatory Visit (INDEPENDENT_AMBULATORY_CARE_PROVIDER_SITE_OTHER): Payer: Medicaid Other | Admitting: Orthopedic Surgery

## 2023-10-20 ENCOUNTER — Encounter: Payer: Self-pay | Admitting: Orthopedic Surgery

## 2023-10-20 DIAGNOSIS — Z89512 Acquired absence of left leg below knee: Secondary | ICD-10-CM | POA: Diagnosis not present

## 2023-10-20 DIAGNOSIS — S88112A Complete traumatic amputation at level between knee and ankle, left lower leg, initial encounter: Secondary | ICD-10-CM

## 2023-10-20 NOTE — Progress Notes (Signed)
Office Visit Note   Patient: Jesse [name]           Date of Birth: 09-14-1961           MRN: [id number] Visit Date: 10/16/2023              Requested by: No referring provider defined for this encounter. PCP: System, Provider Not In  Chief Complaint  Patient presents with   Left Leg - Follow-up    03/15/2023 left BKA    Right Leg - Wound Check      HPI: Patient is a 62 year old gentleman who presents follow-up status post left below-knee amputation patient is currently in a shrinker has no concerns.  Patient states he struck the back of his right calf on the wheelchair causing a small wound.  Assessment & Plan: Visit Diagnoses:  1. Below-knee amputation of left lower extremity, initial encounter [facility])     Plan: Patient will follow-up with [facility] he has a well-healed left below-knee amputation.  There is a small abrasion on the right calf.  Recommended dry dressing change with an Ace wrap.  Follow-Up Instructions: Return in about 4 weeks (around 11/13/2023).   Ortho Exam  Patient is alert, oriented, no adenopathy, well-dressed, normal affect, normal respiratory effort. Examination patient has a well-healed left below-knee amputation he has a follow-up with [facility].  On the right calf he has a flap wound that is 3 cm in diameter 1 mm deep.  4 x 4 and Ace are applied.  Imaging: No results found. No images are attached to the encounter.  Labs: Lab Results  Component Value Date   HGBA1C 6.9 (H) 11/21/2022   HGBA1C 6.3 10/10/2022   HGBA1C 8.3 (H) 06/20/2022   ESRSEDRATE 132 (H) 03/03/2023   ESRSEDRATE 137 (H) 07/08/2022   ESRSEDRATE 107 (H) 06/27/2022   CRP 14.1 (H) 07/08/2022   CRP 16.9 (H) 06/27/2022   CRP 27.2 (H) 04/08/2021   LABURIC 5.3 07/04/2022   LABURIC 7.0 01/14/2016   REPTSTATUS 03/31/2023 FINAL 03/28/2023   GRAMSTAIN  03/28/2023    FEW WBC PRESENT, PREDOMINANTLY PMN ABUNDANT GRAM POSITIVE COCCI IN PAIRS IN CHAINS IN CLUSTERS ABUNDANT GRAM NEGATIVE  RODS FEW GRAM NEGATIVE COCCOBACILLI Performed at [facility] Lab, [address], [place], [place] [address]    CULT (A) 03/28/2023    MULTIPLE ORGANISMS PRESENT, NONE PREDOMINANT NO STAPHYLOCOCCUS AUREUS ISOLATED NO GROUP A STREP (S.PYOGENES) ISOLATED MIXED ANAEROBIC FLORA PRESENT.  CALL LAB IF FURTHER IID REQUIRED.    LABORGA PROTEUS MIRABILIS 04/18/2021     Lab Results  Component Value Date   ALBUMIN 3.4 (L) 09/15/2023   ALBUMIN 2.5 (L) 03/31/2023   ALBUMIN 2.3 (L) 03/27/2023    Lab Results  Component Value Date   MG 1.6 (L) 03/31/2023   MG 1.9 03/18/2023   MG 1.6 (L) 03/17/2023   No results found for: "VD25OH"  No results found for: "PREALBUMIN"    Latest Ref Rng & Units 09/15/2023    6:05 PM 04/12/2023   11:45 PM 04/02/2023    5:54 AM  CBC EXTENDED  WBC 4.0 - 10.5 K/uL 7.1  6.6  10.0   RBC 4.22 - 5.81 MIL/uL 3.98  2.85  2.25   Hemoglobin 13.0 - 17.0 g/dL 84.6  9.1  7.1   HCT 96.2 - 52.0 % 37.5  28.8  22.2   Platelets 150 - 400 K/uL 216  263  340   NEUT# 1.7 - 7.7 K/uL  3.1  Lymph# 0.7 - 4.0 K/uL  2.3       There is no height or weight on file to calculate BMI.  Orders:  No orders of the defined types were placed in this encounter.  No orders of the defined types were placed in this encounter.    Procedures: No procedures performed  Clinical Data: No additional findings.  ROS:  All other systems negative, except as noted in the HPI. Review of Systems  Objective: Vital Signs: There were no vitals taken for this visit.  Specialty Comments:  No specialty comments available.  PMFS History: Patient Active Problem List   Diagnosis Date Noted   Constipation 03/31/2023   Cutaneous abscess of right foot 03/28/2023   Coping style affecting medical condition 03/21/2023   Osteomyelitis of left lower extremity (HCC) 03/18/2023   Gangrene of left foot (HCC) 03/12/2023   Renal insufficiency 03/12/2023   PVD (peripheral vascular disease) (HCC)  03/12/2023   Hyponatremia 03/12/2023   Normocytic anemia 03/12/2023   Lymphedema 03/04/2023   Eye injury 11/21/2022   Hyperkalemia 07/10/2022   Chronic painful diabetic neuropathy (HCC) 07/10/2022   Metabolic acidosis, normal anion gap (NAG) 07/10/2022   Hypomagnesemia 07/10/2022   Charcot's arthropathy 06/28/2022   Leukocytosis    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)    Severe protein-calorie malnutrition (HCC)    Diabetic foot infection (HCC) 06/22/2022   Sepsis (HCC) 06/22/2022   Cellulitis in diabetic foot (HCC) 04/17/2021   Osteomyelitis of left foot (HCC) 04/14/2021   HTN (hypertension) 04/08/2021   HLD (hyperlipidemia) 04/08/2021   Alcohol abuse    Diabetic ulcer of right foot (HCC)    Diabetes mellitus type II, controlled (HCC) 01/27/2014   AKI (acute kidney injury) (HCC) 01/27/2014   Past Medical History:  Diagnosis Date   AKI (acute kidney injury) (HCC) 01/27/2014   Alcohol abuse    Anasarca    Bilateral leg edema 02/07/2021   Cellulitis    Cellulitis and abscess 01/26/2014   Cellulitis of right foot 04/17/2021   Dental abscess 01/26/2014   Diabetes mellitus type 2 in obese    Diabetic foot infection (HCC) 04/08/2021   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 2 diabetes mellitus, limited to breakdown of skin (HCC)    Facial cellulitis 01/26/2014   HTN (hypertension)    Hypokalemia 01/28/2014   Infected dental carries 01/26/2014   Leukocytosis 01/27/2014   Osteomyelitis (HCC) 04/14/2021   Right foot infection     History reviewed. No pertinent family history.  Past Surgical History:  Procedure Laterality Date   ABDOMINAL AORTOGRAM W/LOWER EXTREMITY N/A 03/06/2023   Procedure: ABDOMINAL AORTOGRAM W/LOWER EXTREMITY;  Surgeon: Cephus Shelling, MD;  Location: [facility];  Service: Cardiovascular;  Laterality: N/A;   AMPUTATION Left 03/15/2023   Procedure: AMPUTATION BELOW KNEE;   Surgeon: Nadara Mustard, MD;  Location: [facility] OR;  Service: Orthopedics;  Laterality: Left;   I & D EXTREMITY Right 04/18/2021   Procedure: IRRIGATION AND DEBRIDEMENT OF FOOT;  Surgeon: Nadara Mustard, MD;  Location: [facility] OR;  Service: Orthopedics;  Laterality: Right;   I & D EXTREMITY Right 04/20/2021   Procedure: EXCISIONAL DEBRIDEMENT RIGHT FOOT, APPLICATION OF SKIN GRAFT;  Surgeon: Nadara Mustard, MD;  Location: [facility] OR;  Service: Orthopedics;  Laterality: Right;   I & D EXTREMITY Right 03/28/2023   Procedure: RIGHT HEEL DEBRIDEMENT;  Surgeon: Nadara Mustard, MD;  Location: [facility]  OR;  Service: Orthopedics;  Laterality: Right;   PERIPHERAL VASCULAR INTERVENTION  03/06/2023   Procedure: PERIPHERAL VASCULAR INTERVENTION;  Surgeon: Cephus Shelling, MD;  Location: [facility] LAB;  Service: Cardiovascular;;   Social History   Occupational History   Occupation: Curator  Tobacco Use   Smoking status: Never   Smokeless tobacco: Never  Vaping Use   Vaping status: Never Used  Substance and Sexual Activity   Alcohol use: Not Currently    Alcohol/week: 2.0 standard drinks of alcohol    Types: 2 Cans of beer per week    Comment: Occasional   Drug use: No   Sexual activity: Not Currently    Partners: Female

## 2023-10-22 ENCOUNTER — Encounter: Payer: Self-pay | Admitting: Orthopedic Surgery

## 2023-10-22 NOTE — Progress Notes (Signed)
Office Visit Note   Patient: Jesse [name]           Date of Birth: April 16, 1961           MRN: [id number] Visit Date: 10/20/2023              Requested by: No referring provider defined for this encounter. PCP: System, Provider Not In  Chief Complaint  Patient presents with   Left Leg - Follow-up    Pt picked up prosthetic today.       HPI: Patient is a 62 year old gentleman status post left below-knee amputation he has just picked up his prosthesis.  Patient is currently at [facility] skilled nursing facility.  Assessment & Plan: Visit Diagnoses:  1. Below-knee amputation of left lower extremity, initial encounter [facility])     Plan: Patient will proceed with gait training at [facility].  Follow-Up Instructions: Return in about 2 months (around 12/21/2023).   Ortho Exam  Patient is alert, oriented, no adenopathy, well-dressed, normal affect, normal respiratory effort. Examination the residual limb is well-healed there is no cellulitis wounds or drainage.  Imaging: No results found. No images are attached to the encounter.  Labs: Lab Results  Component Value Date   HGBA1C 6.9 (H) 11/21/2022   HGBA1C 6.3 10/10/2022   HGBA1C 8.3 (H) 06/20/2022   ESRSEDRATE 132 (H) 03/03/2023   ESRSEDRATE 137 (H) 07/08/2022   ESRSEDRATE 107 (H) 06/27/2022   CRP 14.1 (H) 07/08/2022   CRP 16.9 (H) 06/27/2022   CRP 27.2 (H) 04/08/2021   LABURIC 5.3 07/04/2022   LABURIC 7.0 01/14/2016   REPTSTATUS 03/31/2023 FINAL 03/28/2023   GRAMSTAIN  03/28/2023    FEW WBC PRESENT, PREDOMINANTLY PMN ABUNDANT GRAM POSITIVE COCCI IN PAIRS IN CHAINS IN CLUSTERS ABUNDANT GRAM NEGATIVE RODS FEW GRAM NEGATIVE COCCOBACILLI Performed at [facility], [address], [place], [place] [address]    CULT (A) 03/28/2023    MULTIPLE ORGANISMS PRESENT, NONE PREDOMINANT NO STAPHYLOCOCCUS AUREUS ISOLATED NO GROUP A STREP (S.PYOGENES) ISOLATED MIXED ANAEROBIC FLORA PRESENT.  CALL LAB IF FURTHER IID  REQUIRED.    LABORGA PROTEUS MIRABILIS 04/18/2021     Lab Results  Component Value Date   ALBUMIN 3.4 (L) 09/15/2023   ALBUMIN 2.5 (L) 03/31/2023   ALBUMIN 2.3 (L) 03/27/2023    Lab Results  Component Value Date   MG 1.6 (L) 03/31/2023   MG 1.9 03/18/2023   MG 1.6 (L) 03/17/2023   No results found for: "VD25OH"  No results found for: "PREALBUMIN"    Latest Ref Rng & Units 09/15/2023    6:05 PM 04/12/2023   11:45 PM 04/02/2023    5:54 AM  CBC EXTENDED  WBC 4.0 - 10.5 K/uL 7.1  6.6  10.0   RBC 4.22 - 5.81 MIL/uL 3.98  2.85  2.25   Hemoglobin 13.0 - 17.0 g/dL 19.1  9.1  7.1   HCT 47.8 - 52.0 % 37.5  28.8  22.2   Platelets 150 - 400 K/uL 216  263  340   NEUT# 1.7 - 7.7 K/uL  3.1    Lymph# 0.7 - 4.0 K/uL  2.3       There is no height or weight on file to calculate BMI.  Orders:  No orders of the defined types were placed in this encounter.  No orders of the defined types were placed in this encounter.    Procedures: No procedures performed  Clinical Data: No additional findings.  ROS:  All other systems negative, except as noted in the HPI. Review of Systems  Objective: Vital Signs: There were no vitals taken for this visit.  Specialty Comments:  No specialty comments available.  PMFS History: Patient Active Problem List   Diagnosis Date Noted   Constipation 03/31/2023   Cutaneous abscess of right foot 03/28/2023   Coping style affecting medical condition 03/21/2023   Osteomyelitis of left lower extremity (HCC) 03/18/2023   Gangrene of left foot (HCC) 03/12/2023   Renal insufficiency 03/12/2023   PVD (peripheral vascular disease) (HCC) 03/12/2023   Hyponatremia 03/12/2023   Normocytic anemia 03/12/2023   Lymphedema 03/04/2023   Eye injury 11/21/2022   Hyperkalemia 07/10/2022   Chronic painful diabetic neuropathy (HCC) 07/10/2022   Metabolic acidosis, normal anion gap (NAG) 07/10/2022   Hypomagnesemia 07/10/2022   Charcot's arthropathy 06/28/2022    Leukocytosis    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)    Severe protein-calorie malnutrition (HCC)    Diabetic foot infection (HCC) 06/22/2022   Sepsis (HCC) 06/22/2022   Cellulitis in diabetic foot (HCC) 04/17/2021   Osteomyelitis of left foot (HCC) 04/14/2021   HTN (hypertension) 04/08/2021   HLD (hyperlipidemia) 04/08/2021   Alcohol abuse    Diabetic ulcer of right foot (HCC)    Diabetes mellitus type II, controlled (HCC) 01/27/2014   AKI (acute kidney injury) (HCC) 01/27/2014   Past Medical History:  Diagnosis Date   AKI (acute kidney injury) (HCC) 01/27/2014   Alcohol abuse    Anasarca    Bilateral leg edema 02/07/2021   Cellulitis    Cellulitis and abscess 01/26/2014   Cellulitis of right foot 04/17/2021   Dental abscess 01/26/2014   Diabetes mellitus type 2 in obese    Diabetic foot infection (HCC) 04/08/2021   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 2 diabetes mellitus, limited to breakdown of skin (HCC)    Facial cellulitis 01/26/2014   HTN (hypertension)    Hypokalemia 01/28/2014   Infected dental carries 01/26/2014   Leukocytosis 01/27/2014   Osteomyelitis (HCC) 04/14/2021   Right foot infection     History reviewed. No pertinent family history.  Past Surgical History:  Procedure Laterality Date   ABDOMINAL AORTOGRAM W/LOWER EXTREMITY N/A 03/06/2023   Procedure: ABDOMINAL AORTOGRAM W/LOWER EXTREMITY;  Surgeon: Cephus Shelling, MD;  Location: [facility];  Service: Cardiovascular;  Laterality: N/A;   AMPUTATION Left 03/15/2023   Procedure: AMPUTATION BELOW KNEE;  Surgeon: Nadara Mustard, MD;  Location: [facility] OR;  Service: Orthopedics;  Laterality: Left;   I & D EXTREMITY Right 04/18/2021   Procedure: IRRIGATION AND DEBRIDEMENT OF FOOT;  Surgeon: Nadara Mustard, MD;  Location: [facility] OR;  Service: Orthopedics;  Laterality: Right;   I & D EXTREMITY Right 04/20/2021   Procedure: EXCISIONAL  DEBRIDEMENT RIGHT FOOT, APPLICATION OF SKIN GRAFT;  Surgeon: Nadara Mustard, MD;  Location: [facility] OR;  Service: Orthopedics;  Laterality: Right;   I & D EXTREMITY Right 03/28/2023   Procedure: RIGHT HEEL DEBRIDEMENT;  Surgeon: Nadara Mustard, MD;  Location[facility] OR;  Service: Orthopedics;  Laterality: Right;   PERIPHERAL VASCULAR INTERVENTION  03/06/2023   Procedure: PERIPHERAL VASCULAR INTERVENTION;  Surgeon: Cephus Shelling, MD;  Location: [facility];  Service: Cardiovascular;;   Social History   Occupational History   Occupation: Curator  Tobacco Use   Smoking status: Never   Smokeless tobacco: Never  Vaping Use   Vaping  status: Never Used  Substance and Sexual Activity   Alcohol use: Not Currently    Alcohol/week: 2.0 standard drinks of alcohol    Types: 2 Cans of beer per week    Comment: Occasional   Drug use: No   Sexual activity: Not Currently    Partners: Female

## 2023-10-24 ENCOUNTER — Other Ambulatory Visit: Payer: Self-pay

## 2023-10-24 ENCOUNTER — Encounter (HOSPITAL_COMMUNITY): Payer: Self-pay

## 2023-10-24 ENCOUNTER — Emergency Department (HOSPITAL_COMMUNITY)
Admission: EM | Admit: 2023-10-24 | Discharge: 2023-10-24 | Disposition: A | Discharge status: Home / Self Care | Payer: Medicaid Other | Attending: Emergency Medicine | Admitting: Emergency Medicine

## 2023-10-24 DIAGNOSIS — Z7982 Long term (current) use of aspirin: Secondary | ICD-10-CM | POA: Diagnosis not present

## 2023-10-24 DIAGNOSIS — Z79899 Other long term (current) drug therapy: Secondary | ICD-10-CM | POA: Diagnosis not present

## 2023-10-24 DIAGNOSIS — Z7984 Long term (current) use of oral hypoglycemic drugs: Secondary | ICD-10-CM | POA: Diagnosis not present

## 2023-10-24 DIAGNOSIS — R040 Epistaxis: Secondary | ICD-10-CM | POA: Insufficient documentation

## 2023-10-24 DIAGNOSIS — Z7902 Long term (current) use of antithrombotics/antiplatelets: Secondary | ICD-10-CM | POA: Diagnosis not present

## 2023-10-24 DIAGNOSIS — I10 Essential (primary) hypertension: Secondary | ICD-10-CM | POA: Diagnosis not present

## 2023-10-24 DIAGNOSIS — Z794 Long term (current) use of insulin: Secondary | ICD-10-CM | POA: Diagnosis not present

## 2023-10-24 DIAGNOSIS — E119 Type 2 diabetes mellitus without complications: Secondary | ICD-10-CM | POA: Insufficient documentation

## 2023-10-24 MED ORDER — OXYMETAZOLINE HCL 0.05 % NA SOLN
1.0000 | Freq: Once | NASAL | Status: AC
  Administered 2023-10-24: 1 via NASAL
  Filled 2023-10-24: qty 30

## 2023-10-24 NOTE — ED Provider Notes (Signed)
[place] EMERGENCY DEPARTMENT AT [facility] Provider Note   CSN: [id number] Arrival date & time: 10/24/23  1418     History  Chief Complaint  Patient presents with   Epistaxis    JAVIS JAGGARD is a 62 y.o. male.  With a history of epistaxis, hypertension, hyperlipidemia and type 2 diabetes who presents to the ED for epistaxis.  Patient has been recovering in a rehab facility status post left lower extremity amputation.  Nosebleed started today directly prior to arrival and did not resolve with direct pressure.  No injuries falls or other complaints at this time.  Based on outpatient pharmacy records patient takes 75 mg Plavix daily no other anticoagulation   Epistaxis      Home Medications Prior to Admission medications   Medication Sig Start Date End Date Taking? Authorizing Provider  acetaminophen (TYLENOL) 325 MG tablet Take 1-2 tablets (325-650 mg total) by mouth every 6 (six) hours as needed for mild pain. 04/02/23   Setzer, Lynnell Jude, PA-C  artificial tears (LACRILUBE) OINT ophthalmic ointment Place into the right eye every 4 (four) hours as needed for dry eyes. 04/02/23   Setzer, Lynnell Jude, PA-C  ascorbic acid (VITAMIN C) 1000 MG tablet Take 1 tablet (1,000 mg total) by mouth daily. 03/19/23   Rolly Salter, MD  aspirin EC 81 MG tablet Take 1 tablet (81 mg total) by mouth daily. Swallow whole. Patient not taking: Reported on 03/12/2023 03/10/23   Rhetta Mura, MD  atorvastatin (LIPITOR) 40 MG tablet Take 1 tablet (40 mg total) by mouth at bedtime. Patient not taking: Reported on 03/12/2023 03/10/23   Rhetta Mura, MD  clopidogrel (PLAVIX) 75 MG tablet Take 1 tablet (75 mg total) by mouth daily. Patient not taking: Reported on 03/12/2023 03/10/23 03/09/24  Rhetta Mura, MD  dorzolamide-timolol (COSOPT) 2-0.5 % ophthalmic solution Place 1 drop into the right eye 2 (two) times daily. Patient not taking: Reported on 03/12/2023 11/23/22   Adam Phenix, PA-C  doxycycline (VIBRA-TABS) 100 MG tablet Take 1 tablet (100 mg total) by mouth every 12 (twelve) hours. 04/02/23   Setzer, Lynnell Jude, PA-C  feeding supplement, GLUCERNA SHAKE, (GLUCERNA SHAKE) LIQD Take 237 mLs by mouth 3 (three) times daily between meals. 03/18/23   Rolly Salter, MD  gabapentin (NEURONTIN) 100 MG capsule Take 1 capsule (100 mg total) by mouth 3 (three) times daily. 03/18/23 04/17/23  Rolly Salter, MD  insulin aspart (NOVOLOG) 100 UNIT/ML injection Inject 0-9 Units into the skin 3 (three) times daily with meals. 04/02/23   Setzer, Lynnell Jude, PA-C  insulin glargine-yfgn (SEMGLEE) 100 UNIT/ML injection Inject 0.05 mLs (5 Units total) into the skin at bedtime. 04/02/23   Setzer, Lynnell Jude, PA-C  Insulin Pen Needle 32G X 4 MM MISC Use as directed with insulin pen 07/03/22   Rai, Ripudeep K, MD  magnesium chloride (SLOW-MAG) 64 MG TBEC SR tablet Take 1 tablet (64 mg total) by mouth 2 (two) times daily. 04/02/23   Setzer, Lynnell Jude, PA-C  Multiple Vitamin (MULTIVITAMIN WITH MINERALS) TABS tablet Take 1 tablet by mouth daily. 03/19/23   Rolly Salter, MD  nutrition supplement, Heinz Knuckles, Heinz Knuckles) PACK Take 1 packet by mouth 2 (two) times daily between meals. 03/18/23   Rolly Salter, MD  oxyCODONE (OXY IR/ROXICODONE) 5 MG immediate release tablet Take 1 tablet (5 mg total) by mouth every 6 (six) hours as needed for severe pain (pain score 7-10). 04/02/23   Wendi Maya  J, PA-C  pantoprazole (PROTONIX) 40 MG tablet Take 1 tablet (40 mg total) by mouth daily. 03/19/23   Rolly Salter, MD  polyethylene glycol (MIRALAX / GLYCOLAX) 17 g packet Take 17 g by mouth daily as needed for mild constipation. 03/18/23   Rolly Salter, MD  zinc sulfate 220 (50 Zn) MG capsule Take 1 capsule (220 mg total) by mouth daily. 03/19/23   Rolly Salter, MD  glipiZIDE (GLUCOTROL) 5 MG tablet Take 1 tablet (5 mg total) by mouth daily before breakfast. Patient not taking: Reported on 01/14/2016 01/31/14 01/14/16   Christiane Ha, MD      Allergies    Patient has no known allergies.    Review of Systems   Review of Systems  HENT:  Positive for nosebleeds.     Physical Exam Updated Vital Signs BP (!) 161/89 (BP Location: Left Arm)   Pulse 100   Temp 98.6 F (37 C) (Oral)   Resp 18   SpO2 100%  Physical Exam Vitals and nursing note reviewed.  HENT:     Head: Normocephalic and atraumatic.     Nose:     Comments: Large clot removed from right naris with continued active bleeding from right naris No active bleeding from left naris Eyes:     Pupils: Pupils are equal, round, and reactive to light.  Cardiovascular:     Rate and Rhythm: Normal rate and regular rhythm.  Pulmonary:     Effort: Pulmonary effort is normal.     Breath sounds: Normal breath sounds.  Abdominal:     Palpations: Abdomen is soft.     Tenderness: There is no abdominal tenderness.  Skin:    General: Skin is warm and dry.  Neurological:     Mental Status: He is alert.  Psychiatric:        Mood and Affect: Mood normal.     ED Results / Procedures / Treatments   Labs (all labs ordered are listed, but only abnormal results are displayed) Labs Reviewed  BASIC METABOLIC PANEL  CBC WITH DIFFERENTIAL/PLATELET  PROTIME-INR  APTT    EKG None  Radiology No results found.  Procedures Procedures    Medications Ordered in ED Medications  oxymetazoline (AFRIN) 0.05 % nasal spray 1 spray (1 spray Each Nare Given 10/24/23 1523)    ED Course/ Medical Decision Making/ A&P Clinical Course as of 10/24/23 1621  Fri Oct 24, 2023  1604 Assumed care from Dr Elayne Snare. 62 yo M who presented from rehab with a nosebleed. Resolved with afrin and nose clamp 10 mins ago. Getting labs and coags. Unsure if he's still on plavix.  [RP]    Clinical Course User Index [RP] Rondel Baton, MD                                 Medical Decision Making 62 year old male with history of epistaxis on Plavix presenting for  epistaxis.  Unresolved bleeding from right naris.  Large clot removed from right naris with blowing.  Applied Afrin to both nares and clamp for 10 minutes which seemed to resolve the bleeding.  Will obtain basic laboratory workup including coags and continue to monitor in the ED for recurrent bleeding.  Suspect he will be suitable for discharge back to skilled nursing facility  Amount and/or Complexity of Data Reviewed Labs: ordered.  Risk OTC drugs.  Final Clinical Impression(s) / ED Diagnoses Final diagnoses:  Right-sided epistaxis    Rx / DC Orders ED Discharge Orders     None         Royanne Foots, DO 10/24/23 1621

## 2023-10-24 NOTE — Discharge Instructions (Addendum)
Jesse [name] was seen in the emergency department for nosebleed The bleeding stopped after 1 application of Afrin and a nose clamp for 10 minutes His blood work looked okay He should follow-up with his primary care doctor within 1 week for reevaluation.  If he has recurrent bleeding have him follow-up with the ear nose and throat doctors. Continue taking all previously prescribed medications Return to the Emergency Department for recurrent bleeding that does not stop after direct pressure or any other concerns

## 2023-10-24 NOTE — ED Triage Notes (Signed)
Pt bib ems from [facility]; epistaxis x 2 hours; given 2 doses of afrin PTA; bp 180/94, not on thinners; started when attempting to put on new prosthetic leg; hx of same, has had to been treated in ED for Epistaxis; mostly R nostril; ems reports clots, spitting up blood; , 96% RA, P 101, RR 18, cbg 175; pt a and o x 4

## 2023-10-24 NOTE — ED Notes (Signed)
Trinna Post, RN attempted to call [facility] to give report on patient. Was transferred and no one ever answered. Will try back later.

## 2023-10-24 NOTE — ED Provider Notes (Signed)
  Physical Exam  BP (!) 161/89 (BP Location: Left Arm)   Pulse 100   Temp 98.6 F (37 C) (Oral)   Resp 18   SpO2 100%   Physical Exam  Procedures  Procedures  ED Course / MDM   Clinical Course as of 10/24/23 1717  Fri Oct 24, 2023  1604 Assumed care from Dr Elayne Snare. 62 yo M who presented from rehab with a nosebleed. Resolved with afrin and nose clamp 10 mins ago. Getting labs and coags. Unsure if he's still on plavix.  [RP]  1657 Nursing unable to obtain lab work.  I did review his blood work from last month.  Does not have significant anemia.  No thrombocytopenia.  No other reason for him to be Coagulopathic.  Denies gingival bleeding or bleeding elsewhere.  No bruising.  Patient counseled on how to manage nosebleeds with Afrin and pressure and instructed to follow-up with his primary doctor in several days.  Will also have him follow-up with ENT since he appears to be having recurrent nosebleeds. [RP]    Clinical Course User Index [RP] Rondel Baton, MD   Medical Decision Making Amount and/or Complexity of Data Reviewed Labs: ordered.  Risk OTC drugs.      Rondel Baton, MD 10/24/23 [phone]

## 2023-11-13 ENCOUNTER — Ambulatory Visit: Payer: Medicaid Other | Admitting: Orthopedic Surgery

## 2023-11-20 ENCOUNTER — Ambulatory Visit (INDEPENDENT_AMBULATORY_CARE_PROVIDER_SITE_OTHER): Payer: Medicaid Other | Admitting: Orthopedic Surgery

## 2023-11-20 DIAGNOSIS — S88112A Complete traumatic amputation at level between knee and ankle, left lower leg, initial encounter: Secondary | ICD-10-CM

## 2023-11-20 DIAGNOSIS — Z89512 Acquired absence of left leg below knee: Secondary | ICD-10-CM

## 2023-11-21 ENCOUNTER — Encounter: Payer: Self-pay | Admitting: Orthopedic Surgery

## 2023-11-21 NOTE — Progress Notes (Signed)
 Office Visit Note   Patient: Jesse [name]           Date of Birth: Nov 28, 1960           MRN: [id number] Visit Date: 11/20/2023              Requested by: No referring provider defined for this encounter. PCP: System, Provider Not In  Chief Complaint  Patient presents with   Left Leg - Follow-up    03/15/2023 left BKA       HPI: Patient is a 63 year old gentleman who is status post left transtibial amputation.  Patient has appointment tomorrow for prosthetic fitting.  Patient is currently residing in skilled nursing.  Assessment & Plan: Visit Diagnoses:  1. Below-knee amputation of left lower extremity, initial encounter [facility])     Plan: Continue with compression recommended elevation and compression socks for the venous insufficiency right lower extremity.  Patient to proceed with gait training at skilled nursing.  Follow-Up Instructions: Return in about 3 months (around 02/18/2024).   Ortho Exam  Patient is alert, oriented, no adenopathy, well-dressed, normal affect, normal respiratory effort. Examination the residual limb is well consolidated there is no open wounds no cellulitis no drainage.  Imaging: No results found. No images are attached to the encounter.  Labs: Lab Results  Component Value Date   HGBA1C 6.9 (H) 11/21/2022   HGBA1C 6.3 10/10/2022   HGBA1C 8.3 (H) 06/20/2022   ESRSEDRATE 132 (H) 03/03/2023   ESRSEDRATE 137 (H) 07/08/2022   ESRSEDRATE 107 (H) 06/27/2022   CRP 14.1 (H) 07/08/2022   CRP 16.9 (H) 06/27/2022   CRP 27.2 (H) 04/08/2021   LABURIC 5.3 07/04/2022   LABURIC 7.0 01/14/2016   REPTSTATUS 03/31/2023 FINAL 03/28/2023   GRAMSTAIN  03/28/2023    FEW WBC PRESENT, PREDOMINANTLY PMN ABUNDANT GRAM POSITIVE COCCI IN PAIRS IN CHAINS IN CLUSTERS ABUNDANT GRAM NEGATIVE RODS FEW GRAM NEGATIVE COCCOBACILLI Performed at [facility], [address], [place], [place] [address]    CULT (A) 03/28/2023    MULTIPLE ORGANISMS PRESENT, NONE  PREDOMINANT NO STAPHYLOCOCCUS AUREUS ISOLATED NO GROUP A STREP (S.PYOGENES) ISOLATED MIXED ANAEROBIC FLORA PRESENT.  CALL LAB IF FURTHER IID REQUIRED.    LABORGA PROTEUS MIRABILIS 04/18/2021     Lab Results  Component Value Date   ALBUMIN 3.4 (L) 09/15/2023   ALBUMIN 2.5 (L) 03/31/2023   ALBUMIN 2.3 (L) 03/27/2023    Lab Results  Component Value Date   MG 1.6 (L) 03/31/2023   MG 1.9 03/18/2023   MG 1.6 (L) 03/17/2023   No results found for: VD25OH  No results found for: PREALBUMIN    Latest Ref Rng & Units 09/15/2023    6:05 PM 04/12/2023   11:45 PM 04/02/2023    5:54 AM  CBC EXTENDED  WBC 4.0 - 10.5 K/uL 7.1  6.6  10.0   RBC 4.22 - 5.81 MIL/uL 3.98  2.85  2.25   Hemoglobin 13.0 - 17.0 g/dL 87.6  9.1  7.1   HCT 60.9 - 52.0 % 37.5  28.8  22.2   Platelets 150 - 400 K/uL 216  263  340   NEUT# 1.7 - 7.7 K/uL  3.1    Lymph# 0.7 - 4.0 K/uL  2.3       There is no height or weight on file to calculate BMI.  Orders:  No orders of the defined types were placed in this encounter.  No orders of the defined types were placed in  this encounter.    Procedures: No procedures performed  Clinical Data: No additional findings.  ROS:  All other systems negative, except as noted in the HPI. Review of Systems  Objective: Vital Signs: There were no vitals taken for this visit.  Specialty Comments:  No specialty comments available.  PMFS History: Patient Active Problem List   Diagnosis Date Noted   Constipation 03/31/2023   Cutaneous abscess of right foot 03/28/2023   Coping style affecting medical condition 03/21/2023   Osteomyelitis of left lower extremity (HCC) 03/18/2023   Gangrene of left foot (HCC) 03/12/2023   Renal insufficiency 03/12/2023   PVD (peripheral vascular disease) (HCC) 03/12/2023   Hyponatremia 03/12/2023   Normocytic anemia 03/12/2023   Lymphedema 03/04/2023   Eye injury 11/21/2022   Hyperkalemia 07/10/2022   Chronic painful diabetic  neuropathy (HCC) 07/10/2022   Metabolic acidosis, normal anion gap (NAG) 07/10/2022   Hypomagnesemia 07/10/2022   Charcot's arthropathy 06/28/2022   Leukocytosis    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)    Severe protein-calorie malnutrition (HCC)    Diabetic foot infection (HCC) 06/22/2022   Sepsis (HCC) 06/22/2022   Cellulitis in diabetic foot (HCC) 04/17/2021   Osteomyelitis of left foot (HCC) 04/14/2021   HTN (hypertension) 04/08/2021   HLD (hyperlipidemia) 04/08/2021   Alcohol abuse    Diabetic ulcer of right foot (HCC)    Diabetes mellitus type II, controlled (HCC) 01/27/2014   AKI (acute kidney injury) (HCC) 01/27/2014   Past Medical History:  Diagnosis Date   AKI (acute kidney injury) (HCC) 01/27/2014   Alcohol abuse    Anasarca    Bilateral leg edema 02/07/2021   Cellulitis    Cellulitis and abscess 01/26/2014   Cellulitis of right foot 04/17/2021   Dental abscess 01/26/2014   Diabetes mellitus type 2 in obese    Diabetic foot infection (HCC) 04/08/2021   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 2 diabetes mellitus, limited to breakdown of skin (HCC)    Facial cellulitis 01/26/2014   HTN (hypertension)    Hypokalemia 01/28/2014   Infected dental carries 01/26/2014   Leukocytosis 01/27/2014   Osteomyelitis (HCC) 04/14/2021   Right foot infection     History reviewed. No pertinent family history.  Past Surgical History:  Procedure Laterality Date   ABDOMINAL AORTOGRAM W/LOWER EXTREMITY N/A 03/06/2023   Procedure: ABDOMINAL AORTOGRAM W/LOWER EXTREMITY;  Surgeon: Gretta Lonni PARAS, MD;  Location: [facility];  Service: Cardiovascular;  Laterality: N/A;   AMPUTATION Left 03/15/2023   Procedure: AMPUTATION BELOW KNEE;  Surgeon: Harden Jerona GAILS, MD;  Location: [facility] OR;  Service: Orthopedics;  Laterality: Left;   I & D EXTREMITY Right 04/18/2021   Procedure: IRRIGATION AND DEBRIDEMENT OF FOOT;  Surgeon:  Harden Jerona GAILS, MD;  Location: [facility] OR;  Service: Orthopedics;  Laterality: Right;   I & D EXTREMITY Right 04/20/2021   Procedure: EXCISIONAL DEBRIDEMENT RIGHT FOOT, APPLICATION OF SKIN GRAFT;  Surgeon: Harden Jerona GAILS, MD;  Location: [facility] OR;  Service: Orthopedics;  Laterality: Right;   I & D EXTREMITY Right 03/28/2023   Procedure: RIGHT HEEL DEBRIDEMENT;  Surgeon: Harden Jerona GAILS, MD;  Location: [facility] OR;  Service: Orthopedics;  Laterality: Right;   PERIPHERAL VASCULAR INTERVENTION  03/06/2023   Procedure: PERIPHERAL VASCULAR INTERVENTION;  Surgeon: Gretta Lonni PARAS, MD;  Location: [facility];  Service: Cardiovascular;;   Social History   Occupational History   Occupation: curator  Tobacco Use   Smoking status: Never   Smokeless tobacco: Never  Vaping Use   Vaping status: Never Used  Substance and Sexual Activity   Alcohol use: Not Currently    Alcohol/week: 2.0 standard drinks of alcohol    Types: 2 Cans of beer per week    Comment: Occasional   Drug use: No   Sexual activity: Not Currently    Partners: Female

## 2023-11-25 ENCOUNTER — Encounter: Payer: Self-pay | Admitting: Physician Assistant

## 2023-11-25 ENCOUNTER — Ambulatory Visit (HOSPITAL_COMMUNITY)
Admission: RE | Admit: 2023-11-25 | Discharge: 2023-11-25 | Disposition: A | Discharge status: Home / Self Care | Payer: Medicaid Other | Source: Ambulatory Visit | Attending: Vascular Surgery | Admitting: Vascular Surgery

## 2023-11-25 ENCOUNTER — Ambulatory Visit (INDEPENDENT_AMBULATORY_CARE_PROVIDER_SITE_OTHER): Payer: Medicaid Other | Admitting: Physician Assistant

## 2023-11-25 ENCOUNTER — Ambulatory Visit (INDEPENDENT_AMBULATORY_CARE_PROVIDER_SITE_OTHER)
Admission: RE | Admit: 2023-11-25 | Discharge: 2023-11-25 | Disposition: A | Discharge status: Home / Self Care | Payer: Medicaid Other | Source: Ambulatory Visit | Attending: Vascular Surgery | Admitting: Vascular Surgery

## 2023-11-25 VITALS — BP 158/92 | HR 85 | Temp 98.2°F

## 2023-11-25 DIAGNOSIS — I739 Peripheral vascular disease, unspecified: Secondary | ICD-10-CM | POA: Insufficient documentation

## 2023-11-25 LAB — VAS US ABI WITH/WO TBI

## 2023-11-25 NOTE — Progress Notes (Signed)
 [facility] [place] HISTORY AND PHYSICAL   History of Present Illness:  Patient is a 63 y.o. year old male who presents for evaluation of PAD.  He has a history of bilateral lower extremity ulcerations in the setting of PAD.  He underwent right lower extremity angiogram with right anterior tibial artery angioplasty by Dr. Gretta on 03/06/2023.  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 Harden on 03/15/2023 and 03/28/2023 respectively.   He resides at a SNF and is in a dependent position most of the day which cause LE edema on the right LE.      Past Medical History:  Diagnosis Date   AKI (acute kidney injury) (HCC) 01/27/2014   Alcohol abuse    Anasarca    Bilateral leg edema 02/07/2021   Cellulitis    Cellulitis and abscess 01/26/2014   Cellulitis of right foot 04/17/2021   Dental abscess 01/26/2014   Diabetes mellitus type 2 in obese    Diabetic foot infection (HCC) 04/08/2021   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 2 diabetes mellitus, limited to breakdown of skin (HCC)    Facial cellulitis 01/26/2014   HTN (hypertension)    Hypokalemia 01/28/2014   Infected dental carries 01/26/2014   Leukocytosis 01/27/2014   Osteomyelitis (HCC) 04/14/2021   Right foot infection     Past Surgical History:  Procedure Laterality Date   ABDOMINAL AORTOGRAM W/LOWER EXTREMITY N/A 03/06/2023   Procedure: ABDOMINAL AORTOGRAM W/LOWER EXTREMITY;  Surgeon: Gretta Lonni PARAS, MD;  [facility]: [facility];  Service: Cardiovascular;  Laterality: N/A;   AMPUTATION Left 03/15/2023   Procedure: AMPUTATION BELOW KNEE;  Surgeon: Harden Jerona GAILS, MD;  Location: [facility] OR;  Service: Orthopedics;  Laterality: Left;   I & D EXTREMITY Right 04/18/2021   Procedure: IRRIGATION AND DEBRIDEMENT OF FOOT;  Surgeon: Harden Jerona GAILS, MD;  Location: [facility] OR;  Service: Orthopedics;  Laterality: Right;   I & D EXTREMITY Right 04/20/2021   Procedure: EXCISIONAL DEBRIDEMENT RIGHT FOOT,  APPLICATION OF SKIN GRAFT;  Surgeon: Harden Jerona GAILS, MD;  Location: [facility] OR;  Service: Orthopedics;  Laterality: Right;   I & D EXTREMITY Right 03/28/2023   Procedure: RIGHT HEEL DEBRIDEMENT;  Surgeon: Harden Jerona GAILS, MD;  Location: [facility] OR;  Service: Orthopedics;  Laterality: Right;   PERIPHERAL VASCULAR INTERVENTION  03/06/2023   Procedure: PERIPHERAL VASCULAR INTERVENTION;  Surgeon: Gretta Lonni PARAS, MD;  Location: [facility];  Service: Cardiovascular;;    ROS:   General:  No weight loss, Fever, chills  HEENT: No recent headaches, no nasal bleeding, no visual changes, no sore throat  Neurologic: No dizziness, blackouts, seizures. No recent symptoms of stroke or mini- stroke. No recent episodes of slurred speech, or temporary blindness.  Cardiac: No recent episodes of chest pain/pressure, no shortness of breath at rest.  No shortness of breath with exertion.  Denies history of atrial fibrillation or irregular heartbeat  Vascular: No history of rest pain in feet.  No history of claudication.  No history of non-healing ulcer, No history of DVT   Pulmonary: No home oxygen, no productive cough, no hemoptysis,  No asthma or wheezing  Musculoskeletal:  [ ]  Arthritis, [ ]  Low back pain,  [ ]  Joint pain  Hematologic:No history of hypercoagulable state.  No history of easy bleeding.  No history of anemia  Gastrointestinal: No hematochezia or melena,  No gastroesophageal reflux, no trouble swallowing  Urinary: [ ]   chronic Kidney disease, [ ]  on HD - [ ]  MWF or [ ]  TTHS, [ ]  Burning with urination, [ ]  Frequent urination, [ ]  Difficulty urinating;   Skin: No rashes  Psychological: No history of anxiety,  No history of depression  Social History Social History   Tobacco Use   Smoking status: Never   Smokeless tobacco: Never  Vaping Use   Vaping status: Never Used  Substance Use Topics   Alcohol use: Not Currently    Alcohol/week: 2.0 standard drinks of alcohol    Types: 2 Cans  of beer per week    Comment: Occasional   Drug use: No    Family History No family history on file.  Allergies  No Known Allergies   Current Outpatient Medications  Medication Sig Dispense Refill   acetaminophen  (TYLENOL ) 325 MG tablet Take 1-2 tablets (325-650 mg total) by mouth every 6 (six) hours as needed for mild pain.     artificial tears (LACRILUBE) OINT ophthalmic ointment Place into the right eye every 4 (four) hours as needed for dry eyes.     ascorbic acid  (VITAMIN C ) 1000 MG tablet Take 1 tablet (1,000 mg total) by mouth daily. 30 tablet 0   aspirin  EC 81 MG tablet Take 1 tablet (81 mg total) by mouth daily. Swallow whole. 30 tablet 12   atorvastatin  (LIPITOR) 40 MG tablet Take 1 tablet (40 mg total) by mouth at bedtime. 30 tablet 0   clopidogrel  (PLAVIX ) 75 MG tablet Take 1 tablet (75 mg total) by mouth daily. 30 tablet 11   dorzolamide -timolol  (COSOPT ) 2-0.5 % ophthalmic solution Place 1 drop into the right eye 2 (two) times daily. 10 mL 0   doxycycline  (VIBRA -TABS) 100 MG tablet Take 1 tablet (100 mg total) by mouth every 12 (twelve) hours. 28 tablet 0   feeding supplement, GLUCERNA SHAKE, (GLUCERNA SHAKE) LIQD Take 237 mLs by mouth 3 (three) times daily between meals.  0   insulin  aspart (NOVOLOG ) 100 UNIT/ML injection Inject 0-9 Units into the skin 3 (three) times daily with meals. 10 mL 11   insulin  glargine-yfgn (SEMGLEE ) 100 UNIT/ML injection Inject 0.05 mLs (5 Units total) into the skin at bedtime. 10 mL 11   Insulin  Pen Needle 32G X 4 MM MISC Use as directed with insulin  pen 100 each 1   magnesium  chloride (SLOW-MAG) 64 MG TBEC SR tablet Take 1 tablet (64 mg total) by mouth 2 (two) times daily. 60 tablet    Multiple Vitamin (MULTIVITAMIN WITH MINERALS) TABS tablet Take 1 tablet by mouth daily.     nutrition supplement, JUVEN, (JUVEN) PACK Take 1 packet by mouth 2 (two) times daily between meals.  0   oxyCODONE  (OXY IR/ROXICODONE ) 5 MG immediate release tablet Take  1 tablet (5 mg total) by mouth every 6 (six) hours as needed for severe pain (pain score 7-10). 20 tablet 0   pantoprazole  (PROTONIX ) 40 MG tablet Take 1 tablet (40 mg total) by mouth daily. 30 tablet 0   polyethylene glycol (MIRALAX  / GLYCOLAX ) 17 g packet Take 17 g by mouth daily as needed for mild constipation. 14 each 0   zinc  sulfate 220 (50 Zn) MG capsule Take 1 capsule (220 mg total) by mouth daily. 30 capsule 0   gabapentin  (NEURONTIN ) 100 MG capsule Take 1 capsule (100 mg total) by mouth 3 (three) times daily. 90 capsule 0   No current facility-administered medications for this visit.    Physical Examination  Vitals:   11/25/23  1000  BP: (!) 158/92  Pulse: 85  Temp: 98.2 F (36.8 C)  TempSrc: Temporal  SpO2: 95%    There is no height or weight on file to calculate BMI.  General:  Alert and oriented, no acute distress HEENT: Normal Neck: No bruit or JVD Pulmonary: Clear to auscultation bilaterally Cardiac: Regular Rate and Rhythm without murmur Abdomen: Soft, non-tender, non-distended, no mass, no scars Skin: No rash, right plantar foot incision well healed Extremity Pulses:  no ischemic changes Musculoskeletal: right LE edema, left BKA    DATA:   +----------+--------+-----+---------------+--------+-------------------+  RIGHT    PSV cm/sRatioStenosis       WaveformComments             +----------+--------+-----+---------------+--------+-------------------+  DFA      132                         biphasic                     +----------+--------+-----+---------------+--------+-------------------+  SFA Prox  136                         biphasic                     +----------+--------+-----+---------------+--------+-------------------+  SFA Mid   145                         biphasic                     +----------+--------+-----+---------------+--------+-------------------+  SFA Distal202          50-74% stenosisbiphasiclowest end of  range  +----------+--------+-----+---------------+--------+-------------------+  POP Prox  135                         biphasic                     +----------+--------+-----+---------------+--------+-------------------+  POP Distal230          50-74% stenosisbiphasic                     +----------+--------+-----+---------------+--------+-------------------+  ATA Distal94                          biphasic                     +----------+--------+-----+---------------+--------+-------------------+  PTA Distal78                          biphasic                     +----------+--------+-----+---------------+--------+-------------------+  DP       79                          biphasic                     +----------+--------+-----+---------------+--------+-------------------+     Summary:  Right: Patent distal SFA with low end of range 50-74% stenosis.  50-74% popliteal stenosis, low end of range.  Patent anterior tibial artery with limited visualization due to calcific  shadowing, edema and bandaging.    ABI Findings:  +---------+------------------+-----+--------+----------------+  Right   Rt Pressure (mmHg)IndexWaveformComment           +---------+------------------+-----+--------+----------------+  PTA                            biphasicnon-compressible  +---------+------------------+-----+--------+----------------+  DP                             biphasicnon-compressible  +---------+------------------+-----+--------+----------------+  [place] Toe112               0.71                           +---------+------------------+-----+--------+----------------+   +--------+------------------+-----+--------+-------+  Left   Lt Pressure (mmHg)IndexWaveformComment  +--------+------------------+-----+--------+-------+  [id number]                                    +--------+------------------+-----+--------+-------+    +-------+-----------+-----------+------------+------------+  ABI/TBIToday's ABIToday's TBIPrevious ABIPrevious TBI  +-------+-----------+-----------+------------+------------+  Right [place]         0.71       [place]          0.77          +-------+-----------+-----------+------------+------------+     Summary:  Right: Resting right ankle-brachial index indicates noncompressible right  lower extremity arteries. The right toe-brachial index is normal.   ASSESSMENT/PLAN:    He underwent right lower extremity angiogram with right anterior tibial artery angioplasty by Dr. Gretta on 03/06/2023.  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 Harden on 03/15/2023 and 03/28/2023 respectively.   The ABI's demonstrate [place] calcified vessels and the TBI on the right LE is normal   The plantar foot wound has fully healed at this time.  He is waiting for his left BKA prothesis.  I will schedule a 9 months f/u for repeat studies.  He did not want to wait a full 12 months.        Maurilio Deland Collet PA-C [facility] [place] Office: [phone]  MD in clinic [place]

## 2023-12-02 ENCOUNTER — Other Ambulatory Visit: Payer: Self-pay

## 2023-12-02 DIAGNOSIS — I739 Peripheral vascular disease, unspecified: Secondary | ICD-10-CM

## 2023-12-03 ENCOUNTER — Encounter: Payer: Self-pay | Admitting: Family

## 2023-12-03 ENCOUNTER — Ambulatory Visit (INDEPENDENT_AMBULATORY_CARE_PROVIDER_SITE_OTHER): Payer: Medicaid Other | Admitting: Family

## 2023-12-03 DIAGNOSIS — Z89512 Acquired absence of left leg below knee: Secondary | ICD-10-CM

## 2023-12-03 NOTE — Progress Notes (Signed)
Office Visit Note   Patient: Jesse [name]           Date of Birth: [birth year]           MRN: [id number] Visit Date: 12/03/2023              Requested by: No referring provider defined for this encounter. PCP: System, Provider Not In  No chief complaint on file.     HPI: The patient is a 63 year old gentleman who presents today in follow-up he is status post below-knee amputation on the left.  Reports his shin is having increased pain with weightbearing as he has been subsiding into his socket he is currently wearing a layers of ply and has an ill fit feels that he rocks in the socket the socket is too loose due to volume loss  Also reports he has been unable to continue with physical therapy at his skilled nursing facility they report he no longer has any remaining visits but he has not yet gained confidence in his prosthesis would like to continue physical therapy  Assessment & Plan: Visit Diagnoses: No diagnosis found.  Plan: Given an order for outpatient PT gait training for left below-knee amputation also given an order for socket replacement.  Fax this to [facility] today.  He will follow-up in the office in 4 weeks. Follow-Up Instructions: No follow-ups on file.   Ortho Exam  Patient is alert, oriented, no adenopathy, well-dressed, normal affect, normal respiratory effort. On examination left residual limb there are no open areas.  The below-knee amputation is well consolidated and well-healed  Imaging: No results found. No images are attached to the encounter.  Labs: Lab Results  Component Value Date   HGBA1C 6.9 (H) 11/21/2022   HGBA1C 6.3 10/10/2022   HGBA1C 8.3 (H) 06/20/2022   ESRSEDRATE 132 (H) 03/03/2023   ESRSEDRATE 137 (H) 07/08/2022   ESRSEDRATE 107 (H) 06/27/2022   CRP 14.1 (H) 07/08/2022   CRP 16.9 (H) 06/27/2022   CRP 27.2 (H) 04/08/2021   LABURIC 5.3 07/04/2022   LABURIC 7.0 01/14/2016   REPTSTATUS 03/31/2023 FINAL 03/28/2023   GRAMSTAIN   03/28/2023    FEW WBC PRESENT, PREDOMINANTLY PMN ABUNDANT GRAM POSITIVE COCCI IN PAIRS IN CHAINS IN CLUSTERS ABUNDANT GRAM NEGATIVE RODS FEW GRAM NEGATIVE COCCOBACILLI Performed at [facility] Lab, [address], [place], [place] [address]    CULT (A) 03/28/2023    MULTIPLE ORGANISMS PRESENT, NONE PREDOMINANT NO STAPHYLOCOCCUS AUREUS ISOLATED NO GROUP A STREP (S.PYOGENES) ISOLATED MIXED ANAEROBIC FLORA PRESENT.  CALL LAB IF FURTHER IID REQUIRED.    LABORGA PROTEUS MIRABILIS 04/18/2021     Lab Results  Component Value Date   ALBUMIN 3.4 (L) 09/15/2023   ALBUMIN 2.5 (L) 03/31/2023   ALBUMIN 2.3 (L) 03/27/2023    Lab Results  Component Value Date   MG 1.6 (L) 03/31/2023   MG 1.9 03/18/2023   MG 1.6 (L) 03/17/2023   No results found for: "VD25OH"  No results found for: "PREALBUMIN"    Latest Ref Rng & Units 09/15/2023    6:05 PM 04/12/2023   11:45 PM 04/02/2023    5:54 AM  CBC EXTENDED  WBC 4.0 - 10.5 K/uL 7.1  6.6  10.0   RBC 4.22 - 5.81 MIL/uL 3.98  2.85  2.25   Hemoglobin 13.0 - 17.0 g/dL 19.1  9.1  7.1   HCT 47.8 - 52.0 % 37.5  28.8  22.2   Platelets 150 - 400 K/uL  216  263  340   NEUT# 1.7 - 7.7 K/uL  3.1    Lymph# 0.7 - 4.0 K/uL  2.3       There is no height or weight on file to calculate BMI.  Orders:  No orders of the defined types were placed in this encounter.  No orders of the defined types were placed in this encounter.    Procedures: No procedures performed  Clinical Data: No additional findings.  ROS:  All other systems negative, except as noted in the HPI. Review of Systems  Objective: Vital Signs: There were no vitals taken for this visit.  Specialty Comments:  No specialty comments available.  PMFS History: Patient Active Problem List   Diagnosis Date Noted   Constipation 03/31/2023   Cutaneous abscess of right foot 03/28/2023   Coping style affecting medical condition 03/21/2023   Osteomyelitis of left lower extremity (HCC)  03/18/2023   Gangrene of left foot (HCC) 03/12/2023   Renal insufficiency 03/12/2023   PVD (peripheral vascular disease) (HCC) 03/12/2023   Hyponatremia 03/12/2023   Normocytic anemia 03/12/2023   Lymphedema 03/04/2023   Eye injury 11/21/2022   Hyperkalemia 07/10/2022   Chronic painful diabetic neuropathy (HCC) 07/10/2022   Metabolic acidosis, normal anion gap (NAG) 07/10/2022   Hypomagnesemia 07/10/2022   Charcot's arthropathy 06/28/2022   Leukocytosis    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)    Severe protein-calorie malnutrition (HCC)    Diabetic foot infection (HCC) 06/22/2022   Sepsis (HCC) 06/22/2022   Cellulitis in diabetic foot (HCC) 04/17/2021   Osteomyelitis of left foot (HCC) 04/14/2021   HTN (hypertension) 04/08/2021   HLD (hyperlipidemia) 04/08/2021   Alcohol abuse    Diabetic ulcer of right foot (HCC)    Diabetes mellitus type II, controlled (HCC) 01/27/2014   AKI (acute kidney injury) (HCC) 01/27/2014   Past Medical History:  Diagnosis Date   AKI (acute kidney injury) (HCC) 01/27/2014   Alcohol abuse    Anasarca    Bilateral leg edema 02/07/2021   Cellulitis    Cellulitis and abscess 01/26/2014   Cellulitis of right foot 04/17/2021   Dental abscess 01/26/2014   Diabetes mellitus type 2 in obese    Diabetic foot infection (HCC) 04/08/2021   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 2 diabetes mellitus, limited to breakdown of skin (HCC)    Facial cellulitis 01/26/2014   HTN (hypertension)    Hypokalemia 01/28/2014   Infected dental carries 01/26/2014   Leukocytosis 01/27/2014   Osteomyelitis (HCC) 04/14/2021   Right foot infection     No family history on file.  Past Surgical History:  Procedure Laterality Date   ABDOMINAL AORTOGRAM W/LOWER EXTREMITY N/A 03/06/2023   Procedure: ABDOMINAL AORTOGRAM W/LOWER EXTREMITY;  Surgeon: Cephus Shelling, MD;  [facility]: [facility];   Service: Cardiovascular;  Laterality: N/A;   AMPUTATION Left 03/15/2023   Procedure: AMPUTATION BELOW KNEE;  Surgeon: Nadara Mustard, MD;  Location[facility] OR;  Service: Orthopedics;  Laterality: Left;   I & D EXTREMITY Right 04/18/2021   Procedure: IRRIGATION AND DEBRIDEMENT OF FOOT;  Surgeon: Nadara Mustard, MD;  Location: [facility] OR;  Service: Orthopedics;  Laterality: Right;   I & D EXTREMITY Right 04/20/2021   Procedure: EXCISIONAL DEBRIDEMENT RIGHT FOOT, APPLICATION OF SKIN GRAFT;  Surgeon: Nadara Mustard, MD;  Location: [facility] OR;  Service: Orthopedics;  Laterality: Right;   I & D EXTREMITY Right  03/28/2023   Procedure: RIGHT HEEL DEBRIDEMENT;  Surgeon: Nadara Mustard, MD;  Location: [facility] OR;  Service: Orthopedics;  Laterality: Right;   PERIPHERAL VASCULAR INTERVENTION  03/06/2023   Procedure: PERIPHERAL VASCULAR INTERVENTION;  Surgeon: Cephus Shelling, MD;  Location: [facility];  Service: Cardiovascular;;   Social History   Occupational History   Occupation: Curator  Tobacco Use   Smoking status: Never   Smokeless tobacco: Never  Vaping Use   Vaping status: Never Used  Substance and Sexual Activity   Alcohol use: Not Currently    Alcohol/week: 2.0 standard drinks of alcohol    Types: 2 Cans of beer per week    Comment: Occasional   Drug use: No   Sexual activity: Not Currently    Partners: Female

## 2023-12-05 ENCOUNTER — Ambulatory Visit: Payer: Medicaid Other | Admitting: Family

## 2023-12-08 ENCOUNTER — Ambulatory Visit: Payer: Medicaid Other | Attending: Family

## 2023-12-08 ENCOUNTER — Telehealth: Payer: Self-pay | Admitting: Orthopedic Surgery

## 2023-12-08 VITALS — BP 169/94 | HR 90

## 2023-12-08 DIAGNOSIS — M6281 Muscle weakness (generalized): Secondary | ICD-10-CM | POA: Diagnosis present

## 2023-12-08 DIAGNOSIS — Z89512 Acquired absence of left leg below knee: Secondary | ICD-10-CM | POA: Insufficient documentation

## 2023-12-08 DIAGNOSIS — R262 Difficulty in walking, not elsewhere classified: Secondary | ICD-10-CM | POA: Diagnosis present

## 2023-12-08 DIAGNOSIS — R293 Abnormal posture: Secondary | ICD-10-CM | POA: Insufficient documentation

## 2023-12-08 DIAGNOSIS — R2681 Unsteadiness on feet: Secondary | ICD-10-CM | POA: Insufficient documentation

## 2023-12-08 NOTE — Therapy (Unsigned)
OUTPATIENT PHYSICAL THERAPY NEURO EVALUATION   Patient Name: Jesse [name] MRN: [id number] DOB:Apr 22, 1961, 63 y.o., male Today's Date: 12/09/2023   PCP: none reported REFERRING PROVIDER: Barnie Del, NP  END OF SESSION:  PT End of Session - 12/08/23 1444     Visit Number 1    Number of Visits 9    Date for PT Re-Evaluation 02/06/24    Authorization Type medicaid    Authorization - Number of Visits 27    Progress Note Due on Visit 10    PT Start Time 1446    PT Stop Time 1527    PT Time Calculation (min) 41 min    Equipment Utilized During Treatment Gait belt    Activity Tolerance Patient tolerated treatment well;Treatment limited secondary to medical complications (Comment)   HTN   Behavior During Therapy [facility] for tasks assessed/performed             Past Medical History:  Diagnosis Date   AKI (acute kidney injury) (HCC) 01/27/2014   Alcohol abuse    Anasarca    Bilateral leg edema 02/07/2021   Cellulitis    Cellulitis and abscess 01/26/2014   Cellulitis of right foot 04/17/2021   Dental abscess 01/26/2014   Diabetes mellitus type 2 in obese    Diabetic foot infection (HCC) 04/08/2021   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 2 diabetes mellitus, limited to breakdown of skin (HCC)    Facial cellulitis 01/26/2014   HTN (hypertension)    Hypokalemia 01/28/2014   Infected dental carries 01/26/2014   Leukocytosis 01/27/2014   Osteomyelitis (HCC) 04/14/2021   Right foot infection    Past Surgical History:  Procedure Laterality Date   ABDOMINAL AORTOGRAM W/LOWER EXTREMITY N/A 03/06/2023   Procedure: ABDOMINAL AORTOGRAM W/LOWER EXTREMITY;  Surgeon: Cephus Shelling, MD;  Location: [facility];  Service: Cardiovascular;  Laterality: N/A;   AMPUTATION Left 03/15/2023   Procedure: AMPUTATION BELOW KNEE;  Surgeon: Nadara Mustard, MD;  Location: [facility] OR;  Service: Orthopedics;  Laterality: Left;   I & D EXTREMITY Right 04/18/2021   Procedure: IRRIGATION AND  DEBRIDEMENT OF FOOT;  Surgeon: Nadara Mustard, MD;  Location: [facility] OR;  Service: Orthopedics;  Laterality: Right;   I & D EXTREMITY Right 04/20/2021   Procedure: EXCISIONAL DEBRIDEMENT RIGHT FOOT, APPLICATION OF SKIN GRAFT;  Surgeon: Nadara Mustard, MD;  Location: [facility] OR;  Service: Orthopedics;  Laterality: Right;   I & D EXTREMITY Right 03/28/2023   Procedure: RIGHT HEEL DEBRIDEMENT;  Surgeon: Nadara Mustard, MD;  Location: [facility] OR;  Service: Orthopedics;  Laterality: Right;   PERIPHERAL VASCULAR INTERVENTION  03/06/2023   Procedure: PERIPHERAL VASCULAR INTERVENTION;  Surgeon: Cephus Shelling, MD;  Location: [facility];  Service: Cardiovascular;;   Patient Active Problem List   Diagnosis Date Noted   Constipation 03/31/2023   Cutaneous abscess of right foot 03/28/2023   Coping style affecting medical condition 03/21/2023   Osteomyelitis of left lower extremity (HCC) 03/18/2023   Gangrene of left foot (HCC) 03/12/2023   Renal insufficiency 03/12/2023   PVD (peripheral vascular disease) (HCC) 03/12/2023   Hyponatremia 03/12/2023   Normocytic anemia 03/12/2023   Lymphedema 03/04/2023   Eye injury 11/21/2022   Hyperkalemia 07/10/2022   Chronic painful diabetic neuropathy (HCC) 07/10/2022   Metabolic acidosis, normal anion gap (NAG) 07/10/2022   Hypomagnesemia 07/10/2022   Charcot's arthropathy 06/28/2022   Leukocytosis    Chronic venous hypertension (idiopathic) with ulcer  and inflammation of right lower extremity (HCC)    Severe protein-calorie malnutrition (HCC)    Diabetic foot infection (HCC) 06/22/2022   Sepsis (HCC) 06/22/2022   Cellulitis in diabetic foot (HCC) 04/17/2021   Osteomyelitis of left foot (HCC) 04/14/2021   HTN (hypertension) 04/08/2021   HLD (hyperlipidemia) 04/08/2021   Alcohol abuse    Diabetic ulcer of right foot (HCC)    Diabetes mellitus type II, controlled (HCC) 01/27/2014   AKI (acute kidney injury) (HCC) 01/27/2014    ONSET DATE: 12/03/23  referral  REFERRING DIAG: Z61.096 (ICD-10-CM) - Acquired absence of left leg below knee (HCC)   THERAPY DIAG:  Abnormal posture - Plan: PT plan of care cert/re-cert  Difficulty in walking, not elsewhere classified - Plan: PT plan of care cert/re-cert  Muscle weakness (generalized) - Plan: PT plan of care cert/re-cert  Unsteadiness on feet - Plan: PT plan of care cert/re-cert  Rationale for Evaluation and Treatment: Rehabilitation  SUBJECTIVE:                                                                                                                                                                                             SUBJECTIVE STATEMENT: [place], ACE wrap on R LE with DARCO shoe and shrinker x2 on L stump, carrying prosthetic leg bag. He is currently residing in a SNF and "insurance ran out." He has an ABD bandage + shrinker + ACE wrap + additional shrinker on L stump.  Pt accompanied by: self  PERTINENT HISTORY: AKI, alcohol abuse, DMII, HTN, hypokalemia, osteomyelitis  PAIN:  Are you having pain? No  PRECAUTIONS: Fall and Other: L BKA, R DARCO shoe    WEIGHT BEARING RESTRICTIONS: No  FALLS: Has patient fallen in last 6 months? No  LIVING ENVIRONMENT: Lives with: lives with their family and lives in a skilled nursing facility Has following equipment at home: Wheelchair (manual)  PLOF: Requires assistive device for independence  PATIENT GOALS: "to walk better"  OBJECTIVE:  Note: Objective measures were completed at Evaluation unless otherwise noted.  DIAGNOSTIC FINDINGS: R heel MRI  IMPRESSION: Plantar soft tissue ulcers, largest along the plantar heel, extending to within 6 mm of the underlying proximal plantar fascia. Minimal subcortical marrow edema in the posterior plantar calcaneus, similar to prior exam, favored to reflect reactive marrow change. No definitive osteomyelitis. No evidence of an organized soft  tissue abscess.  COGNITION: Overall cognitive status: Within functional limits for tasks assessed   SENSATION: WFL   POSTURE: No Significant postural limitations    TRANSFERS: Assistive device utilized: Walker - 2 wheeled  Sit to  stand: CGA and Min A Stand to sit: CGA and Min A   GAIT: Deferred due to BP  FUNCTIONAL TESTS:                                                                                                                                 TREATMENT N/a eval   PATIENT EDUCATION: Education details: PT POC, exam findings, clearance from provider to weight bear given new wounds, safe BP parameters Person educated: Patient Education method: Explanation Education comprehension: verbalized understanding and needs further education  HOME EXERCISE PROGRAM: To be provided  GOALS: Goals reviewed with patient? Yes  SHORT TERM GOALS: Target date: 01/09/24  Pt will be independent with initial HEP for improved functional strength and mobility  Baseline: to be provided Goal status: INITIAL  2.  Pt will improve 5x STS to </= 40 sec to demo improved functional LE strength and balance   Baseline: 52.75s  Goal status: INITIAL  3.  Gait goal Baseline: to be assessed Goal status: INITIAL  4.  Transfer goal Baseline: to be assessed Goal status: INITIAL    LONG TERM GOALS: Target date: 02/06/24  Pt will be independent with initial HEP for improved functional strength and mobility  Baseline: to be provided Goal status: INITIAL  Pt will improve 5x STS to </= 30 sec to demo improved functional LE strength and balance   Baseline: 52.75s  Goal status: INITIAL  Gait goal Baseline: to be assessed Goal status: INITIAL  Transfer goal Baseline: to be assessed Goal status: INITIAL  ASSESSMENT:  CLINICAL IMPRESSION: Patient is a 63 y.o. male who was seen today for physical therapy evaluation and treatment for mobility impairments related to L BKA and R foot  wound. Eval limited by patients elevated BP. Five times Sit to Stand Test (FTSS) Method: Use a straight back chair with a solid seat that is 17-18" high. Ask participant to sit on the chair with arms folded across their chest.   Instructions: "Stand up and sit down as quickly as possible 5 times, keeping your arms folded across your chest."   Measurement: Stop timing when the participant touches the chair in sitting the 5th time.  TIME: 52.75 sec  Cut off scores indicative of increased fall risk: >12 sec CVA, >16 sec PD, >13 sec vestibular (ANPTA Core Set of Outcome Measures for Adults with Neurologic Conditions, 2018). His balance with gait will be further impacted by his need to wear a DARCO shoe on his R due to a wound. He also presents with a draining wound on the distal posterior aspect of his stump. He reports pain and possibly has mild redness to the distal anterior tibia. Discussed with patient that he needs to follow up with MD about wounds and [facility] regarding fit of socket. He verbalized understanding. He would benefit from skilled PT services to address the above mentioned deficits.     OBJECTIVE IMPAIRMENTS: Abnormal gait, cardiopulmonary  status limiting activity, decreased activity tolerance, decreased balance, decreased endurance, decreased knowledge of condition, decreased knowledge of use of DME, decreased mobility, difficulty walking, decreased strength, and prosthetic dependency .   ACTIVITY LIMITATIONS: carrying, lifting, squatting, stairs, transfers, locomotion level, and caring for others  PARTICIPATION LIMITATIONS: meal prep, cleaning, interpersonal relationship, driving, shopping, and community activity  PERSONAL FACTORS: Past/current experiences, Social background, Time since onset of injury/illness/exacerbation, Transportation, and 3+ comorbidities: see above  are also affecting patient's functional outcome.   REHAB POTENTIAL: Fair wounds, BP  CLINICAL DECISION  MAKING: Stable/uncomplicated  EVALUATION COMPLEXITY: Low  PLAN:  PT FREQUENCY: 1x/week  PT DURATION: 8 weeks  PLANNED INTERVENTIONS: 97110-Therapeutic exercises, 97530- Therapeutic activity, [id number]- Neuromuscular re-education, 97535- Self Care, and [id number]- Manual therapy  PLAN FOR NEXT SESSION: CHECK BP, assess gait, assess transfers, HEP, did he contact [facility] re: socket fit? Did Lajoyce Corners reach out re: wounds?   Westley Foots, PT Westley Foots, PT, DPT, CBIS  12/09/2023, 8:04 AM

## 2023-12-08 NOTE — Telephone Encounter (Signed)
Pt had initial eval with PT today. Was there an area of concern for concern?

## 2023-12-08 NOTE — Telephone Encounter (Signed)
Pt stated he has a cut on his stomp that drains liquid from time to time. He has questions as to if he should put a band aid on it and continue PT or if he shouldn't.

## 2023-12-09 NOTE — Telephone Encounter (Signed)
I called and lm on vm for pt to advise that I had messaged PT and they advised no s/s of infection and had place a dressing to the area. Advised to change dressing everyday and wear his shrinker, to elevate leg while at rest and to keep appt sch 12/23/2023 to call if we need to move the apt up or if he has an y questions.

## 2023-12-11 ENCOUNTER — Telehealth: Payer: Self-pay | Admitting: Orthopedic Surgery

## 2023-12-11 NOTE — Telephone Encounter (Signed)
Pt is requesting a call in regards to his prosthetic and issues with his stomp.

## 2023-12-11 NOTE — Telephone Encounter (Signed)
Patient came into office today to discuss. I do not have a provider here to eval him. He is scheduled tomorrow morning with [name] at 9 am. He was also left a message on his VM per autumn of instructions he needed to do.

## 2023-12-12 ENCOUNTER — Encounter: Payer: Self-pay | Admitting: Family

## 2023-12-12 ENCOUNTER — Ambulatory Visit (INDEPENDENT_AMBULATORY_CARE_PROVIDER_SITE_OTHER): Payer: Medicaid Other | Admitting: Family

## 2023-12-12 DIAGNOSIS — Z89512 Acquired absence of left leg below knee: Secondary | ICD-10-CM

## 2023-12-12 NOTE — Progress Notes (Signed)
Office Visit Note   Patient: Jesse [name]           Date of Birth: [birth year]           MRN: [id number] Visit Date: 12/12/2023              Requested by: No referring provider defined for this encounter. PCP: System, Provider Not In  Chief Complaint  Patient presents with   Right Leg - Wound Check    Right BKA      HPI: The patient is a 63 year old gentleman who is status post left below-knee amputation.  He reports he has had 1 visit with physical therapy at outpatient neurorehab.  He reports the physical therapist was concerned that his prosthetic did not fit properly  He himself is concerned about some wet ulceration to the distal tip of his residual limb  No fever no chills  Assessment & Plan: Visit Diagnoses: No diagnosis found.  Plan: Reassurance provided discussed the tissue in form of his residual limb.  He is unfortunately getting some maceration and ulceration to the tip from subsiding into his socket.  He has had volume loss in the socket is not fitting perfectly  Given a liner liner to wear to protect the residual limb.  Also encouraged him to begin wearing the ply if his socket is getting loose as the day goes on  Ultimately needs to be be seen by his prosthetists for reevaluation.  Encouraged him to call [facility] clinic for appointment.  Follow-Up Instructions: No follow-ups on file.   Ortho Exam  Patient is alert, oriented, no adenopathy, well-dressed, normal affect, normal respiratory effort. On examination left residual limb this is well consolidated well-healed he does have an area of maceration and superficial ulceration to the distal tip this is 6 cm in diameter there is no erythema warmth or sign of infection  Imaging: No results found. No images are attached to the encounter.  Labs: Lab Results  Component Value Date   HGBA1C 6.9 (H) 11/21/2022   HGBA1C 6.3 10/10/2022   HGBA1C 8.3 (H) 06/20/2022   ESRSEDRATE 132 (H) 03/03/2023   ESRSEDRATE  137 (H) 07/08/2022   ESRSEDRATE 107 (H) 06/27/2022   CRP 14.1 (H) 07/08/2022   CRP 16.9 (H) 06/27/2022   CRP 27.2 (H) 04/08/2021   LABURIC 5.3 07/04/2022   LABURIC 7.0 01/14/2016   REPTSTATUS 03/31/2023 FINAL 03/28/2023   GRAMSTAIN  03/28/2023    FEW WBC PRESENT, PREDOMINANTLY PMN ABUNDANT GRAM POSITIVE COCCI IN PAIRS IN CHAINS IN CLUSTERS ABUNDANT GRAM NEGATIVE RODS FEW GRAM NEGATIVE COCCOBACILLI Performed at [facility], [address], [place], [place] [address]    CULT (A) 03/28/2023    MULTIPLE ORGANISMS PRESENT, NONE PREDOMINANT NO STAPHYLOCOCCUS AUREUS ISOLATED NO GROUP A STREP (S.PYOGENES) ISOLATED MIXED ANAEROBIC FLORA PRESENT.  CALL LAB IF FURTHER IID REQUIRED.    LABORGA PROTEUS MIRABILIS 04/18/2021     Lab Results  Component Value Date   ALBUMIN 3.4 (L) 09/15/2023   ALBUMIN 2.5 (L) 03/31/2023   ALBUMIN 2.3 (L) 03/27/2023    Lab Results  Component Value Date   MG 1.6 (L) 03/31/2023   MG 1.9 03/18/2023   MG 1.6 (L) 03/17/2023   No results found for: "VD25OH"  No results found for: "PREALBUMIN"    Latest Ref Rng & Units 09/15/2023    6:05 PM 04/12/2023   11:45 PM 04/02/2023    5:54 AM  CBC EXTENDED  WBC 4.0 - 10.5 K/uL  7.1  6.6  10.0   RBC 4.22 - 5.81 MIL/uL 3.98  2.85  2.25   Hemoglobin 13.0 - 17.0 g/dL 81.1  9.1  7.1   HCT 91.4 - 52.0 % 37.5  28.8  22.2   Platelets 150 - 400 K/uL 216  263  340   NEUT# 1.7 - 7.7 K/uL  3.1    Lymph# 0.7 - 4.0 K/uL  2.3       There is no height or weight on file to calculate BMI.  Orders:  No orders of the defined types were placed in this encounter.  No orders of the defined types were placed in this encounter.    Procedures: No procedures performed  Clinical Data: No additional findings.  ROS:  All other systems negative, except as noted in the HPI. Review of Systems  Objective: Vital Signs: There were no vitals taken for this visit.  Specialty Comments:  No specialty comments  available.  PMFS History: Patient Active Problem List   Diagnosis Date Noted   Constipation 03/31/2023   Cutaneous abscess of right foot 03/28/2023   Coping style affecting medical condition 03/21/2023   Osteomyelitis of left lower extremity (HCC) 03/18/2023   Gangrene of left foot (HCC) 03/12/2023   Renal insufficiency 03/12/2023   PVD (peripheral vascular disease) (HCC) 03/12/2023   Hyponatremia 03/12/2023   Normocytic anemia 03/12/2023   Lymphedema 03/04/2023   Eye injury 11/21/2022   Hyperkalemia 07/10/2022   Chronic painful diabetic neuropathy (HCC) 07/10/2022   Metabolic acidosis, normal anion gap (NAG) 07/10/2022   Hypomagnesemia 07/10/2022   Charcot's arthropathy 06/28/2022   Leukocytosis    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)    Severe protein-calorie malnutrition (HCC)    Diabetic foot infection (HCC) 06/22/2022   Sepsis (HCC) 06/22/2022   Cellulitis in diabetic foot (HCC) 04/17/2021   Osteomyelitis of left foot (HCC) 04/14/2021   HTN (hypertension) 04/08/2021   HLD (hyperlipidemia) 04/08/2021   Alcohol abuse    Diabetic ulcer of right foot (HCC)    Diabetes mellitus type II, controlled (HCC) 01/27/2014   AKI (acute kidney injury) (HCC) 01/27/2014   Past Medical History:  Diagnosis Date   AKI (acute kidney injury) (HCC) 01/27/2014   Alcohol abuse    Anasarca    Bilateral leg edema 02/07/2021   Cellulitis    Cellulitis and abscess 01/26/2014   Cellulitis of right foot 04/17/2021   Dental abscess 01/26/2014   Diabetes mellitus type 2 in obese    Diabetic foot infection (HCC) 04/08/2021   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 2 diabetes mellitus, limited to breakdown of skin (HCC)    Facial cellulitis 01/26/2014   HTN (hypertension)    Hypokalemia 01/28/2014   Infected dental carries 01/26/2014   Leukocytosis 01/27/2014   Osteomyelitis (HCC) 04/14/2021   Right foot infection     No  family history on file.  Past Surgical History:  Procedure Laterality Date   ABDOMINAL AORTOGRAM W/LOWER EXTREMITY N/A 03/06/2023   Procedure: ABDOMINAL AORTOGRAM W/LOWER EXTREMITY;  Surgeon: Cephus Shelling, MD;  Location: [facility];  Service: Cardiovascular;  Laterality: N/A;   AMPUTATION Left 03/15/2023   Procedure: AMPUTATION BELOW KNEE;  Surgeon: Nadara Mustard, MD;  Location: [facility] OR;  Service: Orthopedics;  Laterality: Left;   I & D EXTREMITY Right 04/18/2021   Procedure: IRRIGATION AND DEBRIDEMENT OF FOOT;  Surgeon: Nadara Mustard, MD;  Location: [facility] OR;  Service: Orthopedics;  Laterality: Right;   I & D EXTREMITY Right 04/20/2021   Procedure: EXCISIONAL DEBRIDEMENT RIGHT FOOT, APPLICATION OF SKIN GRAFT;  Surgeon: Nadara Mustard, MD;  Location: [facility] OR;  Service: Orthopedics;  Laterality: Right;   I & D EXTREMITY Right 03/28/2023   Procedure: RIGHT HEEL DEBRIDEMENT;  Surgeon: Nadara Mustard, MD;  Location: [facility] OR;  Service: Orthopedics;  Laterality: Right;   PERIPHERAL VASCULAR INTERVENTION  03/06/2023   Procedure: PERIPHERAL VASCULAR INTERVENTION;  Surgeon: Cephus Shelling, MD;  Location: [facility];  Service: Cardiovascular;;   Social History   Occupational History   Occupation: Curator  Tobacco Use   Smoking status: Never   Smokeless tobacco: Never  Vaping Use   Vaping status: Never Used  Substance and Sexual Activity   Alcohol use: Not Currently    Alcohol/week: 2.0 standard drinks of alcohol    Types: 2 Cans of beer per week    Comment: Occasional   Drug use: No   Sexual activity: Not Currently    Partners: Female

## 2023-12-15 ENCOUNTER — Ambulatory Visit: Payer: Medicaid Other | Attending: Family

## 2023-12-15 VITALS — BP 174/87 | HR 88

## 2023-12-15 DIAGNOSIS — R262 Difficulty in walking, not elsewhere classified: Secondary | ICD-10-CM | POA: Insufficient documentation

## 2023-12-15 DIAGNOSIS — R2681 Unsteadiness on feet: Secondary | ICD-10-CM | POA: Diagnosis present

## 2023-12-15 DIAGNOSIS — M6281 Muscle weakness (generalized): Secondary | ICD-10-CM | POA: Insufficient documentation

## 2023-12-15 DIAGNOSIS — R293 Abnormal posture: Secondary | ICD-10-CM | POA: Insufficient documentation

## 2023-12-15 NOTE — Therapy (Signed)
OUTPATIENT PHYSICAL THERAPY NEURO TREATMENT   Patient Name: Jesse [name] MRN: [id number] DOB:[birth year], 63 y.o., male Today's Date: 12/15/2023   PCP: none reported REFERRING PROVIDER: Barnie Del, NP  END OF SESSION:  PT End of Session - 12/15/23 1024     Visit Number 2    Number of Visits 9    Date for PT Re-Evaluation 02/06/24    Authorization Type medicaid    Authorization - Number of Visits 27    Progress Note Due on Visit 10    PT Start Time 1023   patient late   PT Stop Time 1101    PT Time Calculation (min) 38 min    Equipment Utilized During Treatment Gait belt    Activity Tolerance Patient tolerated treatment well;Treatment limited secondary to medical complications (Comment)   presence of wounds   Behavior During Therapy [facility] for tasks assessed/performed             Past Medical History:  Diagnosis Date   AKI (acute kidney injury) (HCC) 01/27/2014   Alcohol abuse    Anasarca    Bilateral leg edema 02/07/2021   Cellulitis    Cellulitis and abscess 01/26/2014   Cellulitis of right foot 04/17/2021   Dental abscess 01/26/2014   Diabetes mellitus type 2 in obese    Diabetic foot infection (HCC) 04/08/2021   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 2 diabetes mellitus, limited to breakdown of skin (HCC)    Facial cellulitis 01/26/2014   HTN (hypertension)    Hypokalemia 01/28/2014   Infected dental carries 01/26/2014   Leukocytosis 01/27/2014   Osteomyelitis (HCC) 04/14/2021   Right foot infection    Past Surgical History:  Procedure Laterality Date   ABDOMINAL AORTOGRAM W/LOWER EXTREMITY N/A 03/06/2023   Procedure: ABDOMINAL AORTOGRAM W/LOWER EXTREMITY;  Surgeon: Cephus Shelling, MD;  [facility]: [facility];  Service: Cardiovascular;  Laterality: N/A;   AMPUTATION Left 03/15/2023   Procedure: AMPUTATION BELOW KNEE;  Surgeon: Nadara Mustard, MD;  Location: [facility] OR;  Service: Orthopedics;  Laterality: Left;   I & D EXTREMITY Right 04/18/2021    Procedure: IRRIGATION AND DEBRIDEMENT OF FOOT;  Surgeon: Nadara Mustard, MD;  Location: [facility] OR;  Service: Orthopedics;  Laterality: Right;   I & D EXTREMITY Right 04/20/2021   Procedure: EXCISIONAL DEBRIDEMENT RIGHT FOOT, APPLICATION OF SKIN GRAFT;  Surgeon: Nadara Mustard, MD;  Location: [facility] OR;  Service: Orthopedics;  Laterality: Right;   I & D EXTREMITY Right 03/28/2023   Procedure: RIGHT HEEL DEBRIDEMENT;  Surgeon: Nadara Mustard, MD;  Location: [facility] OR;  Service: Orthopedics;  Laterality: Right;   PERIPHERAL VASCULAR INTERVENTION  03/06/2023   Procedure: PERIPHERAL VASCULAR INTERVENTION;  Surgeon: Cephus Shelling, MD;  Location: [facility];  Service: Cardiovascular;;   Patient Active Problem List   Diagnosis Date Noted   Constipation 03/31/2023   Cutaneous abscess of right foot 03/28/2023   Coping style affecting medical condition 03/21/2023   Osteomyelitis of left lower extremity (HCC) 03/18/2023   Gangrene of left foot (HCC) 03/12/2023   Renal insufficiency 03/12/2023   PVD (peripheral vascular disease) (HCC) 03/12/2023   Hyponatremia 03/12/2023   Normocytic anemia 03/12/2023   Lymphedema 03/04/2023   Eye injury 11/21/2022   Hyperkalemia 07/10/2022   Chronic painful diabetic neuropathy (HCC) 07/10/2022   Metabolic acidosis, normal anion gap (NAG) 07/10/2022   Hypomagnesemia 07/10/2022   Charcot's arthropathy 06/28/2022   Leukocytosis    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)    Severe protein-calorie malnutrition (HCC)    Diabetic foot infection (HCC) 06/22/2022   Sepsis (HCC) 06/22/2022   Cellulitis in diabetic foot (HCC) 04/17/2021   Osteomyelitis of left foot (HCC) 04/14/2021   HTN (hypertension) 04/08/2021   HLD (hyperlipidemia) 04/08/2021   Alcohol abuse    Diabetic ulcer of right foot (HCC)    Diabetes mellitus type II, controlled (HCC) 01/27/2014   AKI (acute kidney injury) (HCC) 01/27/2014     ONSET DATE: 12/03/23 referral  REFERRING DIAG: E45.409 (ICD-10-CM) - Acquired absence of left leg below knee (HCC)   THERAPY DIAG:  Abnormal posture  Muscle weakness (generalized)  Difficulty in walking, not elsewhere classified  Unsteadiness on feet  Rationale for Evaluation and Treatment: Rehabilitation  SUBJECTIVE:                                                                                                                                                                                             SUBJECTIVE STATEMENT: [place] clinic [place]. States he met with his Production assistant, radio: and they said his "leg was all fine." Denies falls. Comes to clinic carrying prosthetic- not donned.  Pt accompanied by: self  PERTINENT HISTORY: AKI, alcohol abuse, DMII, HTN, hypokalemia, osteomyelitis  PAIN:  Are you having pain? No  PRECAUTIONS: Fall and Other: L BKA, R DARCO shoe   PATIENT GOALS: "to walk better"  OBJECTIVE:  Note: Objective measures were completed at Evaluation unless otherwise noted.  DIAGNOSTIC FINDINGS: R heel MRI  IMPRESSION: Plantar soft tissue ulcers, largest along the plantar heel, extending to within 6 mm of the underlying proximal plantar fascia. Minimal subcortical marrow edema in the posterior plantar calcaneus, similar to prior exam, favored to reflect reactive marrow change. No definitive osteomyelitis. No evidence of an organized soft tissue abscess.                                                                                                                             TREATMENT Prosthetic management -Patient  donning prosthetic with supervision and intermittent verbal cues for sequencing and sock management  -added 5ply + 1 ply   -patella far elevated, proximal edges of socket digging in at distal thigh   -unable to march in place due to pressure at distal anterior tibia   -reports feeling as though knee is being forced into hyperextension   -PT performed skin check at end of session and observed 2 small (1/2 dime sized) areas of open skin , lateral one with evidence of green slough    PATIENT EDUCATION: Education details: Actor, PT POC, s/s of infection, skin checks/wound management Person educated: Patient Education method: Explanation Education comprehension: verbalized understanding and needs further education  HOME EXERCISE PROGRAM: To be provided  GOALS: Goals reviewed with patient? Yes  SHORT TERM GOALS: Target date: 01/09/24  Pt will be independent with initial HEP for improved functional strength and mobility  Baseline: to be provided Goal status: INITIAL  2.  Pt will improve 5x STS to </= 40 sec to demo improved functional LE strength and balance   Baseline: 52.75s  Goal status: INITIAL  3.  Gait goal Baseline: to be assessed Goal status: INITIAL  4.  Transfer goal Baseline: to be assessed Goal status: INITIAL    LONG TERM GOALS: Target date: 02/06/24  Pt will be independent with initial HEP for improved functional strength and mobility  Baseline: to be provided Goal status: INITIAL  Pt will improve 5x STS to </= 30 sec to demo improved functional LE strength and balance   Baseline: 52.75s  Goal status: INITIAL  Gait goal Baseline: to be assessed Goal status: INITIAL  Transfer goal Baseline: to be assessed Goal status: INITIAL  ASSESSMENT:  CLINICAL IMPRESSION: Patient seen for skilled PT session with emphasis on prosthetic management education. BP was in a safer parameter today, however, he continues to report pain/pressure at distal anterior tibia. Upon skin check, patient with small wounds at distal residual limb with presence of slough. PT emphasizing importance of contacting [facility] re: fit of prosthetic socket and medical provider re: presence of wounds. Patient verbalized understanding. Continue POC as able.     OBJECTIVE IMPAIRMENTS: Abnormal gait, cardiopulmonary  status limiting activity, decreased activity tolerance, decreased balance, decreased endurance, decreased knowledge of condition, decreased knowledge of use of DME, decreased mobility, difficulty walking, decreased strength, and prosthetic dependency .   ACTIVITY LIMITATIONS: carrying, lifting, squatting, stairs, transfers, locomotion level, and caring for others  PARTICIPATION LIMITATIONS: meal prep, cleaning, interpersonal relationship, driving, shopping, and community activity  PERSONAL FACTORS: Past/current experiences, Social background, Time since onset of injury/illness/exacerbation, Transportation, and 3+ comorbidities: see above  are also affecting patient's functional outcome.   REHAB POTENTIAL: Fair wounds, BP  CLINICAL DECISION MAKING: Stable/uncomplicated  EVALUATION COMPLEXITY: Low  PLAN:  PT FREQUENCY: 1x/week  PT DURATION: 8 weeks  PLANNED INTERVENTIONS: 97164- PT Re-evaluation, 97110-Therapeutic exercises, 97530- Therapeutic activity, 97112- Neuromuscular re-education, 97535- Self Care, 16109- Manual therapy, [phone]- Gait training, [phone]- Orthotic Fit/training, [phone]- Prosthetic training, Patient/Family education, Balance training, Stair training, DME instructions, and Wheelchair mobility training  PLAN FOR NEXT SESSION: CHECK BP, assess gait, assess transfers, HEP, did he contact [facility] re: socket fit? Lajoyce Corners re: wounds?   Westley Foots, PT Westley Foots, PT, DPT, CBIS  12/15/2023, 11:50 AM   For all possible CPT codes, reference the Planned Interventions line above.     Check all conditions that are expected to impact treatment: {Conditions expected to impact treatment:Diabetes mellitus, Musculoskeletal disorders, Social determinants  of health, and Presence of Medical Equipment   If treatment provided at initial evaluation, no treatment charged due to lack of authorization.

## 2023-12-22 ENCOUNTER — Ambulatory Visit: Payer: Medicaid Other | Admitting: Physical Therapy

## 2023-12-22 ENCOUNTER — Encounter: Payer: Self-pay | Admitting: Physical Therapy

## 2023-12-22 VITALS — BP 154/91 | HR 91

## 2023-12-22 DIAGNOSIS — M6281 Muscle weakness (generalized): Secondary | ICD-10-CM

## 2023-12-22 DIAGNOSIS — R262 Difficulty in walking, not elsewhere classified: Secondary | ICD-10-CM

## 2023-12-22 DIAGNOSIS — R293 Abnormal posture: Secondary | ICD-10-CM

## 2023-12-22 DIAGNOSIS — R2681 Unsteadiness on feet: Secondary | ICD-10-CM

## 2023-12-22 NOTE — Patient Instructions (Addendum)
 Jesse [name], [name] Assefa is being seen by PT and upon assessment of mechanics of his prosthesis it was noticed that he has difficulty using push release function to doff the prosthesis.  He might benefit from trimming the bottom of the white inner socket as it pushes down behind the button mechanism when walking or maybe a push button extension to make it easier to reach and eliminate the blocking issue of the inner socket?    Thanks for your assessment, Marilou Showman, PT, DTaP  Hand wrote concerns for proximal socket tightness - to be assessed by prosthetist as well as varus force at knee during walking - flare or trim?

## 2023-12-22 NOTE — Therapy (Signed)
 OUTPATIENT PHYSICAL THERAPY NEURO TREATMENT   Patient Name: Jesse [name] MRN: [id number] DOB:01-May-1961, 63 y.o., male Today's Date: 12/22/2023   PCP: none reported REFERRING PROVIDER: Felicity Horseman, NP  END OF SESSION:  PT End of Session - 12/22/23 1034     Visit Number 3    Number of Visits 9    Date for PT Re-Evaluation 02/06/24    Authorization Type medicaid    Authorization - Number of Visits 27    Progress Note Due on Visit 10    PT Start Time 1015    PT Stop Time 1114    PT Time Calculation (min) 59 min    Equipment Utilized During Treatment Gait belt    Activity Tolerance Patient tolerated treatment well    Behavior During Therapy WFL for tasks assessed/performed             Past Medical History:  Diagnosis Date   AKI (acute kidney injury) (HCC) 01/27/2014   Alcohol abuse    Anasarca    Bilateral leg edema 02/07/2021   Cellulitis    Cellulitis and abscess 01/26/2014   Cellulitis of right foot 04/17/2021   Dental abscess 01/26/2014   Diabetes mellitus type 2 in obese    Diabetic foot infection (HCC) 04/08/2021   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 2 diabetes mellitus, limited to breakdown of skin (HCC)    Facial cellulitis 01/26/2014   HTN (hypertension)    Hypokalemia 01/28/2014   Infected dental carries 01/26/2014   Leukocytosis 01/27/2014   Osteomyelitis (HCC) 04/14/2021   Right foot infection    Past Surgical History:  Procedure Laterality Date   ABDOMINAL AORTOGRAM W/LOWER EXTREMITY N/A 03/06/2023   Procedure: ABDOMINAL AORTOGRAM W/LOWER EXTREMITY;  Surgeon: Young Hensen, MD;  Location: [facility];  Service: Cardiovascular;  Laterality: N/A;   AMPUTATION Left 03/15/2023   Procedure: AMPUTATION BELOW KNEE;  Surgeon: Timothy Ford, MD;  Location: [facility] OR;  Service: Orthopedics;  Laterality: Left;   I & D EXTREMITY Right 04/18/2021   Procedure: IRRIGATION AND DEBRIDEMENT OF FOOT;  Surgeon: Timothy Ford, MD;  Location: [facility] OR;   Service: Orthopedics;  Laterality: Right;   I & D EXTREMITY Right 04/20/2021   Procedure: EXCISIONAL DEBRIDEMENT RIGHT FOOT, APPLICATION OF SKIN GRAFT;  Surgeon: Timothy Ford, MD;  Location: [facility] OR;  Service: Orthopedics;  Laterality: Right;   I & D EXTREMITY Right 03/28/2023   Procedure: RIGHT HEEL DEBRIDEMENT;  Surgeon: Timothy Ford, MD;  Location: [facility] OR;  Service: Orthopedics;  Laterality: Right;   PERIPHERAL VASCULAR INTERVENTION  03/06/2023   Procedure: PERIPHERAL VASCULAR INTERVENTION;  Surgeon: Young Hensen, MD;  Location: [facility];  Service: Cardiovascular;;   Patient Active Problem List   Diagnosis Date Noted   Constipation 03/31/2023   Cutaneous abscess of right foot 03/28/2023   Coping style affecting medical condition 03/21/2023   Osteomyelitis of left lower extremity (HCC) 03/18/2023   Gangrene of left foot (HCC) 03/12/2023   Renal insufficiency 03/12/2023   PVD (peripheral vascular disease) (HCC) 03/12/2023   Hyponatremia 03/12/2023   Normocytic anemia 03/12/2023   Lymphedema 03/04/2023   Eye injury 11/21/2022   Hyperkalemia 07/10/2022   Chronic painful diabetic neuropathy (HCC) 07/10/2022   Metabolic acidosis, normal anion gap (NAG) 07/10/2022   Hypomagnesemia 07/10/2022   Charcot's arthropathy 06/28/2022   Leukocytosis    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)  Severe protein-calorie malnutrition (HCC)    Diabetic foot infection (HCC) 06/22/2022   Sepsis (HCC) 06/22/2022   Cellulitis in diabetic foot (HCC) 04/17/2021   Osteomyelitis of left foot (HCC) 04/14/2021   HTN (hypertension) 04/08/2021   HLD (hyperlipidemia) 04/08/2021   Alcohol abuse    Diabetic ulcer of right foot (HCC)    Diabetes mellitus type II, controlled (HCC) 01/27/2014   AKI (acute kidney injury) (HCC) 01/27/2014    ONSET DATE: 12/03/23 referral  REFERRING DIAG: W29.562 (ICD-10-CM) - Acquired absence of left leg  below knee (HCC)   THERAPY DIAG:  Abnormal posture  Muscle weakness (generalized)  Difficulty in walking, not elsewhere classified  Unsteadiness on feet  Rationale for Evaluation and Treatment: Rehabilitation  SUBJECTIVE:                                                                                                                                                                                             SUBJECTIVE STATEMENT: Patient arrives to clinic in manual wheelchair w/ arm rests back - prosthetic sitting off to side not donned.  He does bring bag of socks.  Denies falls. He states he sees his ortho doctor tomorrow, but cannot recall when he last saw his prosthetist.  Pt accompanied by: self  PERTINENT HISTORY: AKI, alcohol abuse, DMII, HTN, hypokalemia, osteomyelitis  PAIN:  Are you having pain? No  PRECAUTIONS: Fall and Other: L BKA, R DARCO shoe   PATIENT GOALS: "to walk better"  OBJECTIVE:  Note: Objective measures were completed at Evaluation unless otherwise noted.  DIAGNOSTIC FINDINGS: R heel MRI  IMPRESSION: Plantar soft tissue ulcers, largest along the plantar heel, extending to within 6 mm of the underlying proximal plantar fascia. Minimal subcortical marrow edema in the posterior plantar calcaneus, similar to prior exam, favored to reflect reactive marrow change. No definitive osteomyelitis. No evidence of an organized soft tissue abscess.                                                                                                                             TREATMENT Vitals  on LUE:   Vitals:   12/22/23 1032  BP: (!) 154/91  Pulse: 91   Skin check:  PT had pt doff layers on residual limb - black vivewear sock (pt carrying wallet in lateral portion of sock) over ACE wrap tightly placed around calf area (not including most distal portion of limb) over doubled liner.  Skin is excessively dry and has a deep lateral crack in thick callus.  He insists  the MD is aware and "okay" with this.  PT explains concerns for wound development in areas of cracks and under thick callus due to other patient factors.  He reports understanding.  He is okay with PT communicating with orthopedic team regarding this.  His prior sloughed wounds have since dried leaving only singular medial patch appearing mildly brighter in color compared to surrounding tissue.  He has a lateral area of incision that appears irritated/slightly red with small partially detached skin tag appearing piece of skin.  He has a flaky callus over distal tibial flare - instructed to monitor for s/s of wound development.  He was discouraged from using ACE bandage around residual limb due to repeated tightness noted by differing therapists and concerns for vascular integrity.  Pt reports understanding. Pt edu on purpose of vivewear and wearing it under liner for wound management. Pt dons components of prosthesis with increased time.  Initially no socks used due to assessment of fit as pt concerned for "wiggling" distally in socket and tightness at top of socket.  When he shows PT this "wiggling" he is gesturing to his muscular flap on distal portion of residual limb.  PT educates that he may have more tissue here that some other amputees as well as thickened callus and edema all contributing to this movement during standing and ambulation.  Goal of sock adjustment and limb maturation over 6 months to get idea contact with socket.  Had pt don 3-ply sock (11 clicks into socket) initially then walk 2x8 ft in // bars.  Repeated w/ 5-ply as pt felt more stable with this ply (12 clicks into socket) then walk 2x8 ft, noting mild varus in standing and ambulation but no severe shift or gait instability.  He has more hesitancy walking with 3-ply and reports feeling the "wiggling" though PT cannot see prosthetic moving inappropriately unless pt exaggerates this feeling using his hip in standing.   Discussed prosthetic  adjustments and needing appt with [facility], PT calls [facility] and pt has appt scheduled for 2/14 at 11am.  Adjustments suggested were typed and written for patient and prosthetist (see pt instructions).  Reminded pt to use planner provided by [facility] clinic to track appts.  Wrote orthopedic appt down for pt for tomorrow 2/11 at 1045am.  Reminded of 2/17 rehab appt.    PATIENT EDUCATION: Education details: see above Person educated: Patient Education method: Explanation Education comprehension: verbalized understanding and needs further education  HOME EXERCISE PROGRAM: To be provided  GOALS: Goals reviewed with patient? Yes  SHORT TERM GOALS: Target date: 01/09/24  Pt will be independent with initial HEP for improved functional strength and mobility  Baseline: to be provided Goal status: INITIAL  2.  Pt will improve 5x STS to </= 40 sec to demo improved functional LE strength and balance   Baseline: 52.75s  Goal status: INITIAL  3.  Gait goal Baseline: to be assessed Goal status: INITIAL  4.  Transfer goal Baseline: to be assessed Goal status: INITIAL    LONG TERM GOALS: Target  date: 02/06/24  Pt will be independent with initial HEP for improved functional strength and mobility  Baseline: to be provided Goal status: INITIAL  Pt will improve 5x STS to </= 30 sec to demo improved functional LE strength and balance   Baseline: 52.75s  Goal status: INITIAL  Gait goal Baseline: to be assessed Goal status: INITIAL  Transfer goal Baseline: to be assessed Goal status: INITIAL  ASSESSMENT:  CLINICAL IMPRESSION: Focus of skilled session today on addressing pt understanding and independence with donning correct layers when wearing shrinker vs prosthesis.  He continues to need extensive education on prosthetic management with PT confirming upcoming [facility] appt and writing detailed prosthetic adjustment suggestions.  He has appt with orthopedics to follow-up on his progress 2/11  and PT to contact MD with concerns regarding excessive callus on residual limb.  Please continue to educate pt on continued layer management and begin gait training.  Will continue per POC.    OBJECTIVE IMPAIRMENTS: Abnormal gait, cardiopulmonary status limiting activity, decreased activity tolerance, decreased balance, decreased endurance, decreased knowledge of condition, decreased knowledge of use of DME, decreased mobility, difficulty walking, decreased strength, and prosthetic dependency .   ACTIVITY LIMITATIONS: carrying, lifting, squatting, stairs, transfers, locomotion level, and caring for others  PARTICIPATION LIMITATIONS: meal prep, cleaning, interpersonal relationship, driving, shopping, and community activity  PERSONAL FACTORS: Past/current experiences, Social background, Time since onset of injury/illness/exacerbation, Transportation, and 3+ comorbidities: see above  are also affecting patient's functional outcome.   REHAB POTENTIAL: Fair wounds, BP  CLINICAL DECISION MAKING: Stable/uncomplicated  EVALUATION COMPLEXITY: Low  PLAN:  PT FREQUENCY: 1x/week  PT DURATION: 8 weeks  PLANNED INTERVENTIONS: 97164- PT Re-evaluation, 97110-Therapeutic exercises, 97530- Therapeutic activity, 97112- Neuromuscular re-education, 97535- Self Care, 78295- Manual therapy, [phone]- Gait training, [phone]- Orthotic Fit/training, [phone]- Prosthetic training, Patient/Family education, Balance training, Stair training, DME instructions, and Wheelchair mobility training  PLAN FOR NEXT SESSION: CHECK BP, assess gait, assess transfers, HEP, did he contact [facility] re: socket fit? Julio Ohm re: wounds?  Did he see ortho and [facility] clinic - any adjustments made to prosthetic?  Any concerns from Dr. Aniceto Kern office regarding callus of limb?   Earlean Glaze, PT, DPT  12/22/2023, 11:18 AM   For all possible CPT codes, reference the Planned Interventions line above.     Check all conditions that are expected  to impact treatment: {Conditions expected to impact treatment:Diabetes mellitus, Musculoskeletal disorders, Social determinants of health, and Presence of Medical Equipment   If treatment provided at initial evaluation, no treatment charged due to lack of authorization.

## 2023-12-23 ENCOUNTER — Encounter: Payer: Self-pay | Admitting: Family

## 2023-12-23 ENCOUNTER — Ambulatory Visit (INDEPENDENT_AMBULATORY_CARE_PROVIDER_SITE_OTHER): Payer: Medicaid Other | Admitting: Family

## 2023-12-23 DIAGNOSIS — Z89512 Acquired absence of left leg below knee: Secondary | ICD-10-CM | POA: Diagnosis not present

## 2023-12-23 DIAGNOSIS — S88112D Complete traumatic amputation at level between knee and ankle, left lower leg, subsequent encounter: Secondary | ICD-10-CM

## 2023-12-23 NOTE — Progress Notes (Signed)
Office Visit Note   Patient: Jesse [name]           Date of Birth: 12-03-1960           MRN: [id number] Visit Date: 12/23/2023              Requested by: Grayce Sessions, NP [address] [place],  [place] [address] PCP: System, Provider Not In  Chief Complaint  Patient presents with   Right Leg - Follow-up      HPI: The patient is a 64 year old gentleman who is well-known to our clinic presents today with concern for ongoing issues with his left residual limb.  He is currently attending outpatient physical therapy for gait training  Has been having some maceration and edema to the distal tip of his left residual limb there is concerned that he is using his Ace wrap around the residual limb not including the distal tip and turn getting off the distal tip.    He reports an ill fit.  He feels that his leg moves too much in his socket he feels unsafe feels that he is having quite a bit of pain from subsiding into his socket over the anterior tibia.  He reports that he has significant volume loss over the course of the day and must occasionally put on up to 7 layers of ply  Assessment & Plan: Visit Diagnoses: No diagnosis found.  Plan: Reinforced proper wear of an Ace wrap should he need this for compression he is to enclose the entire residual limb today encouraged him to just wear both the block of the great shrinkers without an Ace wrap.  Discussed adding layers of ply over the course of the day.  He reports he has an appointment with his prosthetists at the end of this week encouraged him to discuss the concerns he has related to fit and pain with his prosthetist  Follow-Up Instructions: No follow-ups on file.   Ortho Exam  Patient is alert, oriented, no adenopathy, well-dressed, normal affect, normal respiratory effort. On examination left residual limb this is well consolidated well-healed distally he does have an area of firm macerated tissue likely from suction of the  socket moving in and out of his socket which is ill fitting. There is no erythema no purulence no sign of infection Imaging: No results found. No images are attached to the encounter.  Labs: Lab Results  Component Value Date   HGBA1C 6.9 (H) 11/21/2022   HGBA1C 6.3 10/10/2022   HGBA1C 8.3 (H) 06/20/2022   ESRSEDRATE 132 (H) 03/03/2023   ESRSEDRATE 137 (H) 07/08/2022   ESRSEDRATE 107 (H) 06/27/2022   CRP 14.1 (H) 07/08/2022   CRP 16.9 (H) 06/27/2022   CRP 27.2 (H) 04/08/2021   LABURIC 5.3 07/04/2022   LABURIC 7.0 01/14/2016   REPTSTATUS 03/31/2023 FINAL 03/28/2023   GRAMSTAIN  03/28/2023    FEW WBC PRESENT, PREDOMINANTLY PMN ABUNDANT GRAM POSITIVE COCCI IN PAIRS IN CHAINS IN CLUSTERS ABUNDANT GRAM NEGATIVE RODS FEW GRAM NEGATIVE COCCOBACILLI Performed at [facility], [address], [place], [place] [address]    CULT (A) 03/28/2023    MULTIPLE ORGANISMS PRESENT, NONE PREDOMINANT NO STAPHYLOCOCCUS AUREUS ISOLATED NO GROUP A STREP (S.PYOGENES) ISOLATED MIXED ANAEROBIC FLORA PRESENT.  CALL LAB IF FURTHER IID REQUIRED.    LABORGA PROTEUS MIRABILIS 04/18/2021     Lab Results  Component Value Date   ALBUMIN 3.4 (L) 09/15/2023   ALBUMIN 2.5 (L) 03/31/2023   ALBUMIN 2.3 (  L) 03/27/2023    Lab Results  Component Value Date   MG 1.6 (L) 03/31/2023   MG 1.9 03/18/2023   MG 1.6 (L) 03/17/2023   No results found for: "VD25OH"  No results found for: "PREALBUMIN"    Latest Ref Rng & Units 09/15/2023    6:05 PM 04/12/2023   11:45 PM 04/02/2023    5:54 AM  CBC EXTENDED  WBC 4.0 - 10.5 K/uL 7.1  6.6  10.0   RBC 4.22 - 5.81 MIL/uL 3.98  2.85  2.25   Hemoglobin 13.0 - 17.0 g/dL 16.1  9.1  7.1   HCT 09.6 - 52.0 % 37.5  28.8  22.2   Platelets 150 - 400 K/uL 216  263  340   NEUT# 1.7 - 7.7 K/uL  3.1    Lymph# 0.7 - 4.0 K/uL  2.3       There is no height or weight on file to calculate BMI.  Orders:  No orders of the defined types were placed in this encounter.  No  orders of the defined types were placed in this encounter.    Procedures: No procedures performed  Clinical Data: No additional findings.  ROS:  All other systems negative, except as noted in the HPI. Review of Systems  Objective: Vital Signs: There were no vitals taken for this visit.  Specialty Comments:  No specialty comments available.  PMFS History: Patient Active Problem List   Diagnosis Date Noted   Constipation 03/31/2023   Cutaneous abscess of right foot 03/28/2023   Coping style affecting medical condition 03/21/2023   Osteomyelitis of left lower extremity (HCC) 03/18/2023   Gangrene of left foot (HCC) 03/12/2023   Renal insufficiency 03/12/2023   PVD (peripheral vascular disease) (HCC) 03/12/2023   Hyponatremia 03/12/2023   Normocytic anemia 03/12/2023   Lymphedema 03/04/2023   Eye injury 11/21/2022   Hyperkalemia 07/10/2022   Chronic painful diabetic neuropathy (HCC) 07/10/2022   Metabolic acidosis, normal anion gap (NAG) 07/10/2022   Hypomagnesemia 07/10/2022   Charcot's arthropathy 06/28/2022   Leukocytosis    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)    Severe protein-calorie malnutrition (HCC)    Diabetic foot infection (HCC) 06/22/2022   Sepsis (HCC) 06/22/2022   Cellulitis in diabetic foot (HCC) 04/17/2021   Osteomyelitis of left foot (HCC) 04/14/2021   HTN (hypertension) 04/08/2021   HLD (hyperlipidemia) 04/08/2021   Alcohol abuse    Diabetic ulcer of right foot (HCC)    Diabetes mellitus type II, controlled (HCC) 01/27/2014   AKI (acute kidney injury) (HCC) 01/27/2014   Past Medical History:  Diagnosis Date   AKI (acute kidney injury) (HCC) 01/27/2014   Alcohol abuse    Anasarca    Bilateral leg edema 02/07/2021   Cellulitis    Cellulitis and abscess 01/26/2014   Cellulitis of right foot 04/17/2021   Dental abscess 01/26/2014   Diabetes mellitus type 2 in obese    Diabetic foot  infection (HCC) 04/08/2021   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 2 diabetes mellitus, limited to breakdown of skin (HCC)    Facial cellulitis 01/26/2014   HTN (hypertension)    Hypokalemia 01/28/2014   Infected dental carries 01/26/2014   Leukocytosis 01/27/2014   Osteomyelitis (HCC) 04/14/2021   Right foot infection     No family history on file.  Past Surgical History:  Procedure Laterality Date   ABDOMINAL AORTOGRAM W/LOWER EXTREMITY N/A 03/06/2023   Procedure:  ABDOMINAL AORTOGRAM W/LOWER EXTREMITY;  Surgeon: Cephus Shelling, MD;  Location: [facility] INVASIVE CV LAB;  Service: Cardiovascular;  Laterality: N/A;   AMPUTATION Left 03/15/2023   Procedure: AMPUTATION BELOW KNEE;  Surgeon: Nadara Mustard, MD;  Location: [facility] OR;  Service: Orthopedics;  Laterality: Left;   I & D EXTREMITY Right 04/18/2021   Procedure: IRRIGATION AND DEBRIDEMENT OF FOOT;  Surgeon: Nadara Mustard, MD;  Location: [facility] OR;  Service: Orthopedics;  Laterality: Right;   I & D EXTREMITY Right 04/20/2021   Procedure: EXCISIONAL DEBRIDEMENT RIGHT FOOT, APPLICATION OF SKIN GRAFT;  Surgeon: Nadara Mustard, MD;  Location: [facility] OR;  Service: Orthopedics;  Laterality: Right;   I & D EXTREMITY Right 03/28/2023   Procedure: RIGHT HEEL DEBRIDEMENT;  Surgeon: Nadara Mustard, MD;  Location: [facility] OR;  Service: Orthopedics;  Laterality: Right;   PERIPHERAL VASCULAR INTERVENTION  03/06/2023   Procedure: PERIPHERAL VASCULAR INTERVENTION;  Surgeon: Cephus Shelling, MD;  Location: [facility] INVASIVE CV LAB;  Service: Cardiovascular;;   Social History   Occupational History   Occupation: Curator  Tobacco Use   Smoking status: Never   Smokeless tobacco: Never  Vaping Use   Vaping status: Never Used  Substance and Sexual Activity   Alcohol use: Not Currently    Alcohol/week: 2.0 standard drinks of alcohol    Types: 2 Cans of beer per week    Comment: Occasional   Drug use: No   Sexual activity: Not Currently    Partners: Female

## 2023-12-29 ENCOUNTER — Encounter: Payer: Self-pay | Admitting: Physical Therapy

## 2023-12-29 ENCOUNTER — Ambulatory Visit: Payer: Medicaid Other | Admitting: Physical Therapy

## 2023-12-29 VITALS — BP 149/76 | HR 93

## 2023-12-29 DIAGNOSIS — R262 Difficulty in walking, not elsewhere classified: Secondary | ICD-10-CM

## 2023-12-29 DIAGNOSIS — R2681 Unsteadiness on feet: Secondary | ICD-10-CM

## 2023-12-29 DIAGNOSIS — M6281 Muscle weakness (generalized): Secondary | ICD-10-CM

## 2023-12-29 DIAGNOSIS — R293 Abnormal posture: Secondary | ICD-10-CM | POA: Diagnosis not present

## 2023-12-29 NOTE — Therapy (Signed)
OUTPATIENT PHYSICAL THERAPY NEURO TREATMENT   Patient Name: Jesse [name] MRN: [id number] DOB:1960/12/21, 63 y.o., male Today's Date: 12/29/2023   PCP: none reported REFERRING PROVIDER: Barnie Del, NP  END OF SESSION:  PT End of Session - 12/29/23 1017     Visit Number 4    Number of Visits 9    Date for PT Re-Evaluation 02/06/24    Authorization Type medicaid    Authorization - Number of Visits 27    Progress Note Due on Visit 10    PT Start Time 1015    PT Stop Time 1106    PT Time Calculation (min) 51 min    Equipment Utilized During Treatment Gait belt    Activity Tolerance Patient tolerated treatment well    Behavior During Therapy WFL for tasks assessed/performed             Past Medical History:  Diagnosis Date   AKI (acute kidney injury) (HCC) 01/27/2014   Alcohol abuse    Anasarca    Bilateral leg edema 02/07/2021   Cellulitis    Cellulitis and abscess 01/26/2014   Cellulitis of right foot 04/17/2021   Dental abscess 01/26/2014   Diabetes mellitus type 2 in obese    Diabetic foot infection (HCC) 04/08/2021   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 2 diabetes mellitus, limited to breakdown of skin (HCC)    Facial cellulitis 01/26/2014   HTN (hypertension)    Hypokalemia 01/28/2014   Infected dental carries 01/26/2014   Leukocytosis 01/27/2014   Osteomyelitis (HCC) 04/14/2021   Right foot infection    Past Surgical History:  Procedure Laterality Date   ABDOMINAL AORTOGRAM W/LOWER EXTREMITY N/A 03/06/2023   Procedure: ABDOMINAL AORTOGRAM W/LOWER EXTREMITY;  Surgeon: Cephus Shelling, MD;  Location: [facility] INVASIVE CV LAB;  Service: Cardiovascular;  Laterality: N/A;   AMPUTATION Left 03/15/2023   Procedure: AMPUTATION BELOW KNEE;  Surgeon: Nadara Mustard, MD;  Location: [facility] OR;  Service: Orthopedics;  Laterality: Left;   I & D EXTREMITY Right 04/18/2021   Procedure: IRRIGATION AND DEBRIDEMENT OF FOOT;  Surgeon: Nadara Mustard, MD;  Location: [facility] OR;   Service: Orthopedics;  Laterality: Right;   I & D EXTREMITY Right 04/20/2021   Procedure: EXCISIONAL DEBRIDEMENT RIGHT FOOT, APPLICATION OF SKIN GRAFT;  Surgeon: Nadara Mustard, MD;  Location: [facility] OR;  Service: Orthopedics;  Laterality: Right;   I & D EXTREMITY Right 03/28/2023   Procedure: RIGHT HEEL DEBRIDEMENT;  Surgeon: Nadara Mustard, MD;  Location: [facility] OR;  Service: Orthopedics;  Laterality: Right;   PERIPHERAL VASCULAR INTERVENTION  03/06/2023   Procedure: PERIPHERAL VASCULAR INTERVENTION;  Surgeon: Cephus Shelling, MD;  Location: [facility] INVASIVE CV LAB;  Service: Cardiovascular;;   Patient Active Problem List   Diagnosis Date Noted   Constipation 03/31/2023   Cutaneous abscess of right foot 03/28/2023   Coping style affecting medical condition 03/21/2023   Osteomyelitis of left lower extremity (HCC) 03/18/2023   Gangrene of left foot (HCC) 03/12/2023   Renal insufficiency 03/12/2023   PVD (peripheral vascular disease) (HCC) 03/12/2023   Hyponatremia 03/12/2023   Normocytic anemia 03/12/2023   Lymphedema 03/04/2023   Eye injury 11/21/2022   Hyperkalemia 07/10/2022   Chronic painful diabetic neuropathy (HCC) 07/10/2022   Metabolic acidosis, normal anion gap (NAG) 07/10/2022   Hypomagnesemia 07/10/2022   Charcot's arthropathy 06/28/2022   Leukocytosis    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)  Severe protein-calorie malnutrition (HCC)    Diabetic foot infection (HCC) 06/22/2022   Sepsis (HCC) 06/22/2022   Cellulitis in diabetic foot (HCC) 04/17/2021   Osteomyelitis of left foot (HCC) 04/14/2021   HTN (hypertension) 04/08/2021   HLD (hyperlipidemia) 04/08/2021   Alcohol abuse    Diabetic ulcer of right foot (HCC)    Diabetes mellitus type II, controlled (HCC) 01/27/2014   AKI (acute kidney injury) (HCC) 01/27/2014    ONSET DATE: 12/03/23 referral  REFERRING DIAG: Z61.096 (ICD-10-CM) - Acquired absence of left leg  below knee (HCC)   THERAPY DIAG:  Abnormal posture  Muscle weakness (generalized)  Difficulty in walking, not elsewhere classified  Unsteadiness on feet  Rationale for Evaluation and Treatment: Rehabilitation  SUBJECTIVE:                                                                                                                                                                                             SUBJECTIVE STATEMENT: Patient arrives to clinic in manual wheelchair w/ arm rests back - prosthetic donned.  He brought bag of socks.  Denies falls. He reports orthopedic doctor felt residual limb was fine and prosthetist made no changes.  He did not recall discussion of ACE bandage use.  PT re-iterates safety w/ ACE wrap. Pt accompanied by: self  PERTINENT HISTORY: AKI, alcohol abuse, DMII, HTN, hypokalemia, osteomyelitis  PAIN:  Are you having pain? No  PRECAUTIONS: Fall and Other: L BKA, R DARCO shoe   PATIENT GOALS: "to walk better"  OBJECTIVE:  Note: Objective measures were completed at Evaluation unless otherwise noted.  DIAGNOSTIC FINDINGS: R heel MRI  IMPRESSION: Plantar soft tissue ulcers, largest along the plantar heel, extending to within 6 mm of the underlying proximal plantar fascia. Minimal subcortical marrow edema in the posterior plantar calcaneus, similar to prior exam, favored to reflect reactive marrow change. No definitive osteomyelitis. No evidence of an organized soft tissue abscess.  TREATMENT Vitals on LUE:   Vitals:   12/29/23 1021  BP: (!) 149/76  Pulse: 93   Discussed socks:  pt sometimes wearing up to 7-ply and he thought this was the highest he could wear.  PT educated on wearing ply for desired fit, limb maturation, and volume fluctuation - some have more than others.  Discussed vivewear (pt needs new one as his is  torn up) and benefits to maceration and recurrent slough on residual limb.  He states he will contact Dr. Audrie Lia office about getting another. Skin check:  pt has medial flaking of skin, better appearing hydration of residual limb than last session w/o severe callus, some distal maceration and appearance of possible slough without visible opening of limb/wound.  Edu on cleansing and dry residual limb and performing daily skin checks using a mirror or second person to visualize most distal end as he may cycle through dry and macerated skin creating potential for wound development.  He verbalizes understanding.  Pt re-dons prosthetic w/ 1-ply as he felt socket was loose prior. GAIT: Gait pattern:  ER of LLE during increased distance, step to pattern, decreased step length- Right, decreased stance time- Left, decreased stride length, trunk flexed, and wide BOS Distance walked: 230 ft + 345 ft Assistive device utilized: Walker - 2 wheeled Level of assistance: CGA Comments: Pt has ER of socket on second lap.  He inquires why that is.  PT discusses signs of volume loss and need to adjust socks.  Had pt don 3-ply in sitting following initial bout of gait for improved fit.  He is dyspneic following initial bout of gait requiring prolonged seated rest.  Pt has lateral thrust at the knee w/o reported pain in medial knee area.  He states he spoke to prosthetist about this who also is w/o concern.  He was instructed to monitor this area for signs of skin breakdown.  Cued for upright posture, decreased reliance on UE, and R step through vs scuffing ground w/ fair improvement and maintenance for prolonged distance.  He is fatigued following farther distance.  PATIENT EDUCATION: Education details: Use 2WW to ambulate vs crutches - PT w/ concerns for endurance.  Initial HEP.  Sock management and skin checks.  Contact Dr. Lajoyce Corners or [facility] for vivewear.  Continue 6 hour wear time (do not wear prosthetic if skin opens up),  break it up into 2 sections to prevent skin breakdown and maceration as needed.  Discussed muscle flap and "looseness" of socket and fear of breaking tibia in weight bearing due to "wiggling" of calf in socket. Person educated: Patient Education method: Explanation Education comprehension: verbalized understanding and needs further education  HOME EXERCISE PROGRAM: *Pt declined printout 2/17 -Standing forearm pushups on walker (from forward bent trunk into tall standing)  -STS 4x5 (push-to-stand) - reviewed proper stand technique to 2WW  GOALS: Goals reviewed with patient? Yes  SHORT TERM GOALS: Target date: 01/09/24  Pt will be independent with initial HEP for improved functional strength and mobility  Baseline: to be provided Goal status: INITIAL  2.  Pt will improve 5x STS to </= 40 sec to demo improved functional LE strength and balance   Baseline: 52.75s  Goal status: INITIAL  3.  Gait goal Baseline: to be assessed Goal status: INITIAL  4.  Transfer goal Baseline: to be assessed Goal status: INITIAL    LONG TERM GOALS: Target date: 02/06/24  Pt will be independent with initial HEP for improved functional strength and mobility  Baseline: to be provided Goal status: INITIAL  Pt will improve 5x STS to </= 30 sec to demo improved functional LE strength and balance   Baseline: 52.75s  Goal status: INITIAL  Gait goal Baseline: to be assessed Goal status: INITIAL  Transfer goal Baseline: to be assessed Goal status: INITIAL  ASSESSMENT:  CLINICAL IMPRESSION: Significant time spent re-iterating education from prior session regarding residual limb management and sock adjustment.  He is doing better this visit with initiating needed sock adjustment in sitting, but demonstrates poor understanding of why the limb volume fluctuates and how to note this with standing and ambulatory activity.  PT still with concerns for residual limb wound development due to slough  appearing material on most distal portion as well as obvious maceration.  Time spent discussing ways to manage this at home and encouraging every day skin checks.  MD and prosthetist aware of ongoing skin issues.  Will continue per POC as able.  OBJECTIVE IMPAIRMENTS: Abnormal gait, cardiopulmonary status limiting activity, decreased activity tolerance, decreased balance, decreased endurance, decreased knowledge of condition, decreased knowledge of use of DME, decreased mobility, difficulty walking, decreased strength, and prosthetic dependency .   ACTIVITY LIMITATIONS: carrying, lifting, squatting, stairs, transfers, locomotion level, and caring for others  PARTICIPATION LIMITATIONS: meal prep, cleaning, interpersonal relationship, driving, shopping, and community activity  PERSONAL FACTORS: Past/current experiences, Social background, Time since onset of injury/illness/exacerbation, Transportation, and 3+ comorbidities: see above  are also affecting patient's functional outcome.   REHAB POTENTIAL: Fair wounds, BP  CLINICAL DECISION MAKING: Stable/uncomplicated  EVALUATION COMPLEXITY: Low  PLAN:  PT FREQUENCY: 1x/week  PT DURATION: 8 weeks  PLANNED INTERVENTIONS: 97164- PT Re-evaluation, 97110-Therapeutic exercises, 97530- Therapeutic activity, 97112- Neuromuscular re-education, 97535- Self Care, 09811- Manual therapy, [phone]- Gait training, [phone]- Orthotic Fit/training, [phone]- Prosthetic training, Patient/Family education, Balance training, Stair training, DME instructions, and Wheelchair mobility training  PLAN FOR NEXT SESSION: CHECK BP, Gait training w/ 2WW, static balance, assess transfers, HEP, wear schedule?  Skin integrity?  Sink HEP   Sadie Haber, PT, DPT  12/29/2023, 1:19 PM   For all possible CPT codes, reference the Planned Interventions line above.     Check all conditions that are expected to impact treatment: {Conditions expected to impact treatment:Diabetes  mellitus, Musculoskeletal disorders, Social determinants of health, and Presence of Medical Equipment   If treatment provided at initial evaluation, no treatment charged due to lack of authorization.

## 2024-01-05 ENCOUNTER — Encounter: Payer: Self-pay | Admitting: Physical Therapy

## 2024-01-05 ENCOUNTER — Ambulatory Visit: Payer: Medicaid Other | Admitting: Physical Therapy

## 2024-01-05 ENCOUNTER — Ambulatory Visit (INDEPENDENT_AMBULATORY_CARE_PROVIDER_SITE_OTHER): Payer: Medicaid Other | Admitting: Orthopedic Surgery

## 2024-01-05 ENCOUNTER — Telehealth: Payer: Self-pay | Admitting: Orthopedic Surgery

## 2024-01-05 VITALS — BP 128/75 | HR 86

## 2024-01-05 DIAGNOSIS — M6281 Muscle weakness (generalized): Secondary | ICD-10-CM

## 2024-01-05 DIAGNOSIS — Z89512 Acquired absence of left leg below knee: Secondary | ICD-10-CM

## 2024-01-05 DIAGNOSIS — R2681 Unsteadiness on feet: Secondary | ICD-10-CM

## 2024-01-05 DIAGNOSIS — R262 Difficulty in walking, not elsewhere classified: Secondary | ICD-10-CM

## 2024-01-05 DIAGNOSIS — S88112A Complete traumatic amputation at level between knee and ankle, left lower leg, initial encounter: Secondary | ICD-10-CM

## 2024-01-05 DIAGNOSIS — R293 Abnormal posture: Secondary | ICD-10-CM | POA: Diagnosis not present

## 2024-01-05 NOTE — Telephone Encounter (Signed)
 Pt is coming in with concerns on his prosthetic. He says he can't walk with it on but it hits his bone and reopen a sore, he waits a while to let it heal but once it heals and he tries it again the same thing happens. Pt also stated its putting too much pressure on his bone. Pt stated he is going to wait around until he speaks with someone. I did communicate with the pt the office schedule time. Pt stated he will wait here to 1:00pm or if he gets a returned call.

## 2024-01-05 NOTE — Telephone Encounter (Signed)
 Pt was put on the schedule this afternoon for follow up. He came back into the office this afternoon.

## 2024-01-05 NOTE — Therapy (Signed)
 OUTPATIENT PHYSICAL THERAPY NEURO TREATMENT   Patient Name: Jesse [name] MRN: [id number] DOB:1961-04-03, 63 y.o., male Today's Date: 01/05/2024   PCP: none reported REFERRING PROVIDER: Barnie Del, NP  END OF SESSION:  PT End of Session - 01/05/24 1026     Visit Number 5    Number of Visits 9    Date for PT Re-Evaluation 02/06/24    Authorization Type medicaid    Authorization - Number of Visits 27    Progress Note Due on Visit 10    PT Start Time 1015    PT Stop Time 1100    PT Time Calculation (min) 45 min    Equipment Utilized During Treatment Gait belt    Activity Tolerance Patient tolerated treatment well    Behavior During Therapy WFL for tasks assessed/performed             Past Medical History:  Diagnosis Date   AKI (acute kidney injury) (HCC) 01/27/2014   Alcohol abuse    Anasarca    Bilateral leg edema 02/07/2021   Cellulitis    Cellulitis and abscess 01/26/2014   Cellulitis of right foot 04/17/2021   Dental abscess 01/26/2014   Diabetes mellitus type 2 in obese    Diabetic foot infection (HCC) 04/08/2021   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 2 diabetes mellitus, limited to breakdown of skin (HCC)    Facial cellulitis 01/26/2014   HTN (hypertension)    Hypokalemia 01/28/2014   Infected dental carries 01/26/2014   Leukocytosis 01/27/2014   Osteomyelitis (HCC) 04/14/2021   Right foot infection    Past Surgical History:  Procedure Laterality Date   ABDOMINAL AORTOGRAM W/LOWER EXTREMITY N/A 03/06/2023   Procedure: ABDOMINAL AORTOGRAM W/LOWER EXTREMITY;  Surgeon: Cephus Shelling, MD;  [facility];  Service: Cardiovascular;  Laterality: N/A;   AMPUTATION Left 03/15/2023   Procedure: AMPUTATION BELOW KNEE;  Surgeon: Nadara Mustard, MD;  [facility] OR;  Service: Orthopedics;  Laterality: Left;   I & D EXTREMITY Right 04/18/2021   Procedure: IRRIGATION AND DEBRIDEMENT OF FOOT;  Surgeon: Nadara Mustard, MD;  Location: [facility] OR;   Service: Orthopedics;  Laterality: Right;   I & D EXTREMITY Right 04/20/2021   Procedure: EXCISIONAL DEBRIDEMENT RIGHT FOOT, APPLICATION OF SKIN GRAFT;  Surgeon: Nadara Mustard, MD;  Location: [facility] OR;  Service: Orthopedics;  Laterality: Right;   I & D EXTREMITY Right 03/28/2023   Procedure: RIGHT HEEL DEBRIDEMENT;  Surgeon: Nadara Mustard, MD;  Location: [facility] OR;  Service: Orthopedics;  Laterality: Right;   PERIPHERAL VASCULAR INTERVENTION  03/06/2023   Procedure: PERIPHERAL VASCULAR INTERVENTION;  Surgeon: Cephus Shelling, MD;  [facility];  Service: Cardiovascular;;   Patient Active Problem List   Diagnosis Date Noted   Constipation 03/31/2023   Cutaneous abscess of right foot 03/28/2023   Coping style affecting medical condition 03/21/2023   Osteomyelitis of left lower extremity (HCC) 03/18/2023   Gangrene of left foot (HCC) 03/12/2023   Renal insufficiency 03/12/2023   PVD (peripheral vascular disease) (HCC) 03/12/2023   Hyponatremia 03/12/2023   Normocytic anemia 03/12/2023   Lymphedema 03/04/2023   Eye injury 11/21/2022   Hyperkalemia 07/10/2022   Chronic painful diabetic neuropathy (HCC) 07/10/2022   Metabolic acidosis, normal anion gap (NAG) 07/10/2022   Hypomagnesemia 07/10/2022   Charcot's arthropathy 06/28/2022   Leukocytosis    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)  Severe protein-calorie malnutrition (HCC)    Diabetic foot infection (HCC) 06/22/2022   Sepsis (HCC) 06/22/2022   Cellulitis in diabetic foot (HCC) 04/17/2021   Osteomyelitis of left foot (HCC) 04/14/2021   HTN (hypertension) 04/08/2021   HLD (hyperlipidemia) 04/08/2021   Alcohol abuse    Diabetic ulcer of right foot (HCC)    Diabetes mellitus type II, controlled (HCC) 01/27/2014   AKI (acute kidney injury) (HCC) 01/27/2014    ONSET DATE: 12/03/23 referral  REFERRING DIAG: Z61.096 (ICD-10-CM) - Acquired absence of left leg  below knee (HCC)   THERAPY DIAG:  Abnormal posture  Muscle weakness (generalized)  Difficulty in walking, not elsewhere classified  Unsteadiness on feet  Rationale for Evaluation and Treatment: Rehabilitation  SUBJECTIVE:                                                                                                                                                                                             SUBJECTIVE STATEMENT: Patient arrives to clinic in manual wheelchair w/ arm rests back - prosthetic not donned.  He brought bag of socks.  Denies falls.  PT re-iterates safety w/ ACE wrap as pt has this donned over liner and under black vivewear sock as a tourniquet again.  He states this is how the orthopedic doctor told him to do it. Pt accompanied by: self  PERTINENT HISTORY: AKI, alcohol abuse, DMII, HTN, hypokalemia, osteomyelitis  PAIN:  Are you having pain? No  PRECAUTIONS: Fall and Other: L BKA, R DARCO shoe   PATIENT GOALS: "to walk better"  OBJECTIVE:  Note: Objective measures were completed at Evaluation unless otherwise noted.  DIAGNOSTIC FINDINGS: R heel MRI  IMPRESSION: Plantar soft tissue ulcers, largest along the plantar heel, extending to within 6 mm of the underlying proximal plantar fascia. Minimal subcortical marrow edema in the posterior plantar calcaneus, similar to prior exam, favored to reflect reactive marrow change. No definitive osteomyelitis. No evidence of an organized soft tissue abscess.  TREATMENT Vitals on LUE:   Vitals:   01/05/24 1020  BP: 128/75  Pulse: 86   Discussed Ace Wrap:  pt not incorporating most distal portion of residual limb when tightly wrapping Ace bandage was encouraged to either not use the Ace wrap or to wrap the most distal portion as well to maintain limb shape. Discussed pressure in socket  and difficulty fitting into socket.  Pt states he is having a hard time getting limb into socket, but is regularly wearing 5-ply socks so PT is unsure of accuracy.  He states he feels a lot of pressure in his distal limb so he is always adding socks but that the prosthetist told him to put as much pressure on his distal limb as possible.  He also reports he was wearing the Ace bandage the wrong way hoping it would stretch his limb out to better fit in the socket.  PT is uncertain what pt is doing at home based on report. Skin check:  pt continues to have cracking of callus on residual limb but flaking appears better this visit.  No appearance of slough. Pt re-donned liner (he does not have pin in liner he was previously wearing for pin-lock socket, states prosthetist told him to take pin out if wearing liner as a shrinker, he has the liner with the pin and asks the PT why he needs to wear this one with his socket); had to remove inner socket in order to don prosthetic due to tight fit SciFit hill mode up to level 6.0 x 8 minutes using BUE/BLE for edema management and improved LLE strength and mobility.  RPE 5/10 following task. Pt is wearing prosthetic from about 9am to 6pm without taking it off.  He states he puts variable pressure through the limb for about 30 minutes at a time in sitting.  He is not standing or walking frequently and is using the wheelchair primarily.  Pt re-dons prosthetic w/ inner socket - unable to get prosthetic on (tried putting inner socket on first as well).  Resorted to no inner socket, but 5-ply sock - socket still loose, added 3-ply w/ improved fit. GAIT: Gait pattern:  ER of LLE during increased distance, step to pattern, decreased step length- Right, decreased stance time- Left, decreased stride length, trunk flexed, and wide BOS Distance walked: 115 ft Assistive device utilized: Walker - 2 wheeled Level of assistance: CGA Comments: Cued for upright posture and decreased  reliance on UE.  PATIENT EDUCATION: Education details: Use 2WW to ambulate vs crutches - PT w/ concerns for endurance.  Continue HEP.  Sock management and skin checks.  Contact Dr. Lajoyce Corners or [facility] for vivewear - pt does not recall discussing this with therapist prior.  Continue 9 hour wear time (do not wear prosthetic if skin opens up), break it up into 2 sections to prevent skin breakdown and maceration as needed.  Re-iterated safety with Ace bandage or not using at all.  Edu on using wheelchair brakes as pt frequently does not lock without cuing before doing other forward bent tasks.  Discouraged pt from trying on and walking in someone else's prosthetic as he states he has done this a couple times in his facility. Person educated: Patient Education method: Explanation Education comprehension: verbalized understanding and needs further education  HOME EXERCISE PROGRAM: *Pt declined printout 2/17 -Standing forearm pushups on walker (from forward bent trunk into tall standing)  -STS 4x5 (push-to-stand) - reviewed proper stand technique to 2WW  GOALS:  Goals reviewed with patient? Yes  SHORT TERM GOALS: Target date: 01/09/24  Pt will be independent with initial HEP for improved functional strength and mobility  Baseline: to be provided Goal status: INITIAL  2.  Pt will improve 5x STS to </= 40 sec to demo improved functional LE strength and balance   Baseline: 52.75s  Goal status: INITIAL  3.  Gait goal Baseline: to be assessed Goal status: INITIAL  4.  Transfer goal Baseline: to be assessed Goal status: INITIAL    LONG TERM GOALS: Target date: 02/06/24  Pt will be independent with initial HEP for improved functional strength and mobility  Baseline: to be provided Goal status: INITIAL  Pt will improve 5x STS to </= 30 sec to demo improved functional LE strength and balance   Baseline: 52.75s  Goal status: INITIAL  Gait goal Baseline: to be assessed Goal status:  INITIAL  Transfer goal Baseline: to be assessed Goal status: INITIAL  ASSESSMENT:  CLINICAL IMPRESSION: PT continues to have significant concerns about pt cognition limiting progress with prosthetic.  He demonstrates safety concerns with wheelchair brake management, proper prosthetic management, and edema management strategies.  Carryover has been limited requiring several sessions of review on prosthetic components, amputee factors, and proper adjustments of the two.  He continues to benefit from skilled PT in this session to improve overall mobility, but this PT feels he may be close to functional maximum due to previously mentioned concerns.  OBJECTIVE IMPAIRMENTS: Abnormal gait, cardiopulmonary status limiting activity, decreased activity tolerance, decreased balance, decreased endurance, decreased knowledge of condition, decreased knowledge of use of DME, decreased mobility, difficulty walking, decreased strength, and prosthetic dependency .   ACTIVITY LIMITATIONS: carrying, lifting, squatting, stairs, transfers, locomotion level, and caring for others  PARTICIPATION LIMITATIONS: meal prep, cleaning, interpersonal relationship, driving, shopping, and community activity  PERSONAL FACTORS: Past/current experiences, Social background, Time since onset of injury/illness/exacerbation, Transportation, and 3+ comorbidities: see above  are also affecting patient's functional outcome.   REHAB POTENTIAL: Fair wounds, BP  CLINICAL DECISION MAKING: Stable/uncomplicated  EVALUATION COMPLEXITY: Low  PLAN:  PT FREQUENCY: 1x/week  PT DURATION: 8 weeks  PLANNED INTERVENTIONS: 97164- PT Re-evaluation, 97110-Therapeutic exercises, 97530- Therapeutic activity, 97112- Neuromuscular re-education, 97535- Self Care, 16109- Manual therapy, [phone]- Gait training, [phone]- Orthotic Fit/training, [phone]- Prosthetic training, Patient/Family education, Balance training, Stair training, DME instructions, and  Wheelchair mobility training  PLAN FOR NEXT SESSION: CHECK BP, Gait training w/ 2WW, static balance, assess transfers, HEP, wear schedule?  Skin integrity?  Sink HEP   Sadie Haber, PT, DPT  01/05/2024, 11:02 AM   For all possible CPT codes, reference the Planned Interventions line above.     Check all conditions that are expected to impact treatment: {Conditions expected to impact treatment:Diabetes mellitus, Musculoskeletal disorders, Social determinants of health, and Presence of Medical Equipment   If treatment provided at initial evaluation, no treatment charged due to lack of authorization.

## 2024-01-06 ENCOUNTER — Encounter: Payer: Self-pay | Admitting: Orthopedic Surgery

## 2024-01-06 NOTE — Progress Notes (Signed)
 Office Visit Note   Patient: Jesse [name]           Date of Birth: May 31, 1961           MRN: [id number] Visit Date: 01/05/2024              Requested by: No referring provider defined for this encounter. PCP: System, Provider Not In  Chief Complaint  Patient presents with   Left Leg - Pain    Hx BKA      HPI: Patient is a 63 year old gentleman who is status post left below-knee amputation.  Patient states she has been having increasing pain with weightbearing through the socket.  Patient feels like he is subsiding into the socket.  Patient has had the socket modified.  Assessment & Plan: Visit Diagnoses:  1. Below-knee amputation of left lower extremity, initial encounter [facility])     Plan: Patient was provided prescription for [facility] for adjustments to his socket.  Anticipate patient would need a new socket in 3 to 4 months.  Follow-Up Instructions: Return in about 2 months (around 03/04/2024).   Ortho Exam  Patient is alert, oriented, no adenopathy, well-dressed, normal affect, normal respiratory effort. Examination of the residual limb there is no cellulitis no ulcers no drainage no tenderness to palpation no calluses.  There are medial and lateral modifications within the socket.  Imaging: No results found. No images are attached to the encounter.  Labs: Lab Results  Component Value Date   HGBA1C 6.9 (H) 11/21/2022   HGBA1C 6.3 10/10/2022   HGBA1C 8.3 (H) 06/20/2022   ESRSEDRATE 132 (H) 03/03/2023   ESRSEDRATE 137 (H) 07/08/2022   ESRSEDRATE 107 (H) 06/27/2022   CRP 14.1 (H) 07/08/2022   CRP 16.9 (H) 06/27/2022   CRP 27.2 (H) 04/08/2021   LABURIC 5.3 07/04/2022   LABURIC 7.0 01/14/2016   REPTSTATUS 03/31/2023 FINAL 03/28/2023   GRAMSTAIN  03/28/2023    FEW WBC PRESENT, PREDOMINANTLY PMN ABUNDANT GRAM POSITIVE COCCI IN PAIRS IN CHAINS IN CLUSTERS ABUNDANT GRAM NEGATIVE RODS FEW GRAM NEGATIVE COCCOBACILLI Performed at [facility], [address] [place], [place] [address]    CULT (A) 03/28/2023    MULTIPLE ORGANISMS PRESENT, NONE PREDOMINANT NO STAPHYLOCOCCUS AUREUS ISOLATED NO GROUP A STREP (S.PYOGENES) ISOLATED MIXED ANAEROBIC FLORA PRESENT.  CALL LAB IF FURTHER IID REQUIRED.    LABORGA PROTEUS MIRABILIS 04/18/2021     Lab Results  Component Value Date   ALBUMIN 3.4 (L) 09/15/2023   ALBUMIN 2.5 (L) 03/31/2023   ALBUMIN 2.3 (L) 03/27/2023    Lab Results  Component Value Date   MG 1.6 (L) 03/31/2023   MG 1.9 03/18/2023   MG 1.6 (L) 03/17/2023   No results found for: "VD25OH"  No results found for: "PREALBUMIN"    Latest Ref Rng & Units 09/15/2023    6:05 PM 04/12/2023   11:45 PM 04/02/2023    5:54 AM  CBC EXTENDED  WBC 4.0 - 10.5 K/uL 7.1  6.6  10.0   RBC 4.22 - 5.81 MIL/uL 3.98  2.85  2.25   Hemoglobin 13.0 - 17.0 g/dL 19.1  9.1  7.1   HCT 47.8 - 52.0 % 37.5  28.8  22.2   Platelets 150 - 400 K/uL 216  263  340   NEUT# 1.7 - 7.7 K/uL  3.1    Lymph# 0.7 - 4.0 K/uL  2.3       There is no height or weight on file to calculate  BMI.  Orders:  No orders of the defined types were placed in this encounter.  No orders of the defined types were placed in this encounter.    Procedures: No procedures performed  Clinical Data: No additional findings.  ROS:  All other systems negative, except as noted in the HPI. Review of Systems  Objective: Vital Signs: There were no vitals taken for this visit.  Specialty Comments:  No specialty comments available.  PMFS History: Patient Active Problem List   Diagnosis Date Noted   Constipation 03/31/2023   Cutaneous abscess of right foot 03/28/2023   Coping style affecting medical condition 03/21/2023   Osteomyelitis of left lower extremity (HCC) 03/18/2023   Gangrene of left foot (HCC) 03/12/2023   Renal insufficiency 03/12/2023   PVD (peripheral vascular disease) (HCC) 03/12/2023   Hyponatremia 03/12/2023   Normocytic anemia 03/12/2023   Lymphedema  03/04/2023   Eye injury 11/21/2022   Hyperkalemia 07/10/2022   Chronic painful diabetic neuropathy (HCC) 07/10/2022   Metabolic acidosis, normal anion gap (NAG) 07/10/2022   Hypomagnesemia 07/10/2022   Charcot's arthropathy 06/28/2022   Leukocytosis    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)    Severe protein-calorie malnutrition (HCC)    Diabetic foot infection (HCC) 06/22/2022   Sepsis (HCC) 06/22/2022   Cellulitis in diabetic foot (HCC) 04/17/2021   Osteomyelitis of left foot (HCC) 04/14/2021   HTN (hypertension) 04/08/2021   HLD (hyperlipidemia) 04/08/2021   Alcohol abuse    Diabetic ulcer of right foot (HCC)    Diabetes mellitus type II, controlled (HCC) 01/27/2014   AKI (acute kidney injury) (HCC) 01/27/2014   Past Medical History:  Diagnosis Date   AKI (acute kidney injury) (HCC) 01/27/2014   Alcohol abuse    Anasarca    Bilateral leg edema 02/07/2021   Cellulitis    Cellulitis and abscess 01/26/2014   Cellulitis of right foot 04/17/2021   Dental abscess 01/26/2014   Diabetes mellitus type 2 in obese    Diabetic foot infection (HCC) 04/08/2021   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 2 diabetes mellitus, limited to breakdown of skin (HCC)    Facial cellulitis 01/26/2014   HTN (hypertension)    Hypokalemia 01/28/2014   Infected dental carries 01/26/2014   Leukocytosis 01/27/2014   Osteomyelitis (HCC) 04/14/2021   Right foot infection     History reviewed. No pertinent family history.  Past Surgical History:  Procedure Laterality Date   ABDOMINAL AORTOGRAM W/LOWER EXTREMITY N/A 03/06/2023   Procedure: ABDOMINAL AORTOGRAM W/LOWER EXTREMITY;  Surgeon: Cephus Shelling, MD;  [facility]: [facility];  Service: Cardiovascular;  Laterality: N/A;   AMPUTATION Left 03/15/2023   Procedure: AMPUTATION BELOW KNEE;  Surgeon: Nadara Mustard, MD;  Location: [facility] OR;  Service: Orthopedics;  Laterality: Left;    I & D EXTREMITY Right 04/18/2021   Procedure: IRRIGATION AND DEBRIDEMENT OF FOOT;  Surgeon: Nadara Mustard, MD;  Location: [facility] OR;  Service: Orthopedics;  Laterality: Right;   I & D EXTREMITY Right 04/20/2021   Procedure: EXCISIONAL DEBRIDEMENT RIGHT FOOT, APPLICATION OF SKIN GRAFT;  Surgeon: Nadara Mustard, MD;  Location: [facility] OR;  Service: Orthopedics;  Laterality: Right;   I & D EXTREMITY Right 03/28/2023   Procedure: RIGHT HEEL DEBRIDEMENT;  Surgeon: Nadara Mustard, MD;  Location: [facility] OR;  Service: Orthopedics;  Laterality: Right;   PERIPHERAL VASCULAR INTERVENTION  03/06/2023   Procedure: PERIPHERAL VASCULAR INTERVENTION;  Surgeon: Chestine Spore,  Canary Brim, MD;  Location: [facility];  Service: Cardiovascular;;   Social History   Occupational History   Occupation: Curator  Tobacco Use   Smoking status: Never   Smokeless tobacco: Never  Vaping Use   Vaping status: Never Used  Substance and Sexual Activity   Alcohol use: Not Currently    Alcohol/week: 2.0 standard drinks of alcohol    Types: 2 Cans of beer per week    Comment: Occasional   Drug use: No   Sexual activity: Not Currently    Partners: Female

## 2024-01-12 ENCOUNTER — Encounter: Payer: Self-pay | Admitting: Physical Therapy

## 2024-01-12 ENCOUNTER — Ambulatory Visit: Payer: Medicaid Other | Admitting: Physical Therapy

## 2024-01-12 DIAGNOSIS — R293 Abnormal posture: Secondary | ICD-10-CM

## 2024-01-12 DIAGNOSIS — R2681 Unsteadiness on feet: Secondary | ICD-10-CM

## 2024-01-12 DIAGNOSIS — R262 Difficulty in walking, not elsewhere classified: Secondary | ICD-10-CM

## 2024-01-12 DIAGNOSIS — M6281 Muscle weakness (generalized): Secondary | ICD-10-CM

## 2024-01-12 NOTE — Therapy (Signed)
 [facility] [address] [place], [place], [address] Phone: [phone]   Fax:  [phone]  Patient Details - ARRIVED NO CHARGE Name: Jesse [name] MRN: [id number] Date of Birth: April 16, 1961 Referring Provider:  No ref. provider found  Encounter Date: 01/12/2024  Pt arrives to clinic with prosthetic doffed wanting to reschedule visit as he has been unable to get the leg on for 2-3 days.  He denies excessive salt intake and reports he has only been wearing his shrinker 1/2 the night because the swelling causes a lot of discomfort and pressure.  PT attempts to help pt don prosthetic with inner and then without inner socket without success using various positions.  Palpable excess posterior residual limb swelling.  Discussed trying the SciFit to decrease limb volume, but pt reports he is having discomfort and would rather reschedule appt.  PT assessed skin integrity with limb looking similarly intact to last visit.  Discussed wearing shrinker when prosthetic not on, discouraged pt from sleeping with prosthetic donned as he inquires about, explained difference between KAFO and prosthetic as pt feels he would benefit more from a KAFO on the LLE, educated on K levels and progression of prosthetic components as tolerance and performance improves.  PT planning to contact [facility] if unable to rectify situation at rescheduled 3/5 appt as socket fit has become increasingly tight and unable to accommodate pt gaining limb volume.  Pt has appts for [facility] and Dr. Lajoyce Corners on 3/7.  Sadie Haber, PT, DPT 01/12/2024, 10:40 AM  [facility] [address] [place], [place], [address] Phone: [phone]   Fax:  [phone]

## 2024-01-13 ENCOUNTER — Ambulatory Visit: Payer: Medicaid Other | Admitting: Orthopedic Surgery

## 2024-01-14 ENCOUNTER — Telehealth: Payer: Self-pay | Admitting: Physical Therapy

## 2024-01-14 ENCOUNTER — Ambulatory Visit: Attending: Family | Admitting: Physical Therapy

## 2024-01-14 DIAGNOSIS — R262 Difficulty in walking, not elsewhere classified: Secondary | ICD-10-CM | POA: Insufficient documentation

## 2024-01-14 DIAGNOSIS — M6281 Muscle weakness (generalized): Secondary | ICD-10-CM | POA: Insufficient documentation

## 2024-01-14 DIAGNOSIS — R293 Abnormal posture: Secondary | ICD-10-CM | POA: Insufficient documentation

## 2024-01-14 DIAGNOSIS — R2681 Unsteadiness on feet: Secondary | ICD-10-CM | POA: Insufficient documentation

## 2024-01-14 NOTE — Telephone Encounter (Signed)
 PT spoke to patient regarding missed visit today, he states he did not come because his leg is still sore and he did not want to put his prosthetic on.  He was reminded of upcoming appt at [facility] and need to communicate ongoing issues with care provider there.  He was also reminded of upcoming PT appt.  He verbalizes understanding and plans to attend both.  Camille Bal, PT, DPT

## 2024-01-16 ENCOUNTER — Ambulatory Visit: Payer: Medicaid Other | Admitting: Family

## 2024-01-16 ENCOUNTER — Encounter: Payer: Self-pay | Admitting: Family

## 2024-01-16 DIAGNOSIS — S88112D Complete traumatic amputation at level between knee and ankle, left lower leg, subsequent encounter: Secondary | ICD-10-CM

## 2024-01-16 DIAGNOSIS — Z89512 Acquired absence of left leg below knee: Secondary | ICD-10-CM

## 2024-01-16 DIAGNOSIS — I890 Lymphedema, not elsewhere classified: Secondary | ICD-10-CM | POA: Diagnosis not present

## 2024-01-16 NOTE — Progress Notes (Signed)
 Office Visit Note   Patient: Jesse [name]           Date of Birth: 1961/07/21           MRN: [id number] Visit Date: 01/16/2024              Requested by: No referring provider defined for this encounter. PCP: System, Provider Not In  Chief Complaint  Patient presents with   Left Leg - Follow-up      HPI: The patient is a 63 year old gentleman who presents today in follow-up  He had previously been having difficulty with his prosthesis for his left residual limb last week did present to physical therapy and was unable to don his prosthesis due to increased edema.  At that time it appears that he received reinforcement and education that he is to be wearing his shrinker around-the-clock he is not to be sleeping in the prosthesis   he today presents with decrease of his edema he is able to don his prosthesis.  He reports if he walks a half a mile he has volume loss and must apply layers of ply at the end of the day requires about 5 layers of ply  Does have some pain over the anterior tibia  Has an appointment with [facility] later today  Assessment & Plan: Visit Diagnoses: No diagnosis found.  Plan: Follow-up with prosthetists as well as physical therapy as scheduled.  Follow-Up Instructions: Return in about 4 weeks (around 02/13/2024), or if symptoms worsen or fail to improve.   Ortho Exam  Patient is alert, oriented, no adenopathy, well-dressed, normal affect, normal respiratory effort. On examination left residual limb this is well consolidated well-healed there is no area of callus no skin breakdown no maceration no weeping no pitting edema  Imaging: No results found. No images are attached to the encounter.  Labs: Lab Results  Component Value Date   HGBA1C 6.9 (H) 11/21/2022   HGBA1C 6.3 10/10/2022   HGBA1C 8.3 (H) 06/20/2022   ESRSEDRATE 132 (H) 03/03/2023   ESRSEDRATE 137 (H) 07/08/2022   ESRSEDRATE 107 (H) 06/27/2022   CRP 14.1 (H) 07/08/2022   CRP  16.9 (H) 06/27/2022   CRP 27.2 (H) 04/08/2021   LABURIC 5.3 07/04/2022   LABURIC 7.0 01/14/2016   REPTSTATUS 03/31/2023 FINAL 03/28/2023   GRAMSTAIN  03/28/2023    FEW WBC PRESENT, PREDOMINANTLY PMN ABUNDANT GRAM POSITIVE COCCI IN PAIRS IN CHAINS IN CLUSTERS ABUNDANT GRAM NEGATIVE RODS FEW GRAM NEGATIVE COCCOBACILLI Performed at [facility], [address], [place], [place] [address]    CULT (A) 03/28/2023    MULTIPLE ORGANISMS PRESENT, NONE PREDOMINANT NO STAPHYLOCOCCUS AUREUS ISOLATED NO GROUP A STREP (S.PYOGENES) ISOLATED MIXED ANAEROBIC FLORA PRESENT.  CALL LAB IF FURTHER IID REQUIRED.    LABORGA PROTEUS MIRABILIS 04/18/2021     Lab Results  Component Value Date   ALBUMIN 3.4 (L) 09/15/2023   ALBUMIN 2.5 (L) 03/31/2023   ALBUMIN 2.3 (L) 03/27/2023    Lab Results  Component Value Date   MG 1.6 (L) 03/31/2023   MG 1.9 03/18/2023   MG 1.6 (L) 03/17/2023   No results found for: "VD25OH"  No results found for: "PREALBUMIN"    Latest Ref Rng & Units 09/15/2023    6:05 PM 04/12/2023   11:45 PM 04/02/2023    5:54 AM  CBC EXTENDED  WBC 4.0 - 10.5 K/uL 7.1  6.6  10.0   RBC 4.22 - 5.81 MIL/uL 3.98  2.85  2.25   Hemoglobin 13.0 - 17.0 g/dL 16.1  9.1  7.1   HCT 09.6 - 52.0 % 37.5  28.8  22.2   Platelets 150 - 400 K/uL 216  263  340   NEUT# 1.7 - 7.7 K/uL  3.1    Lymph# 0.7 - 4.0 K/uL  2.3       There is no height or weight on file to calculate BMI.  Orders:  No orders of the defined types were placed in this encounter.  No orders of the defined types were placed in this encounter.    Procedures: No procedures performed  Clinical Data: No additional findings.  ROS:  All other systems negative, except as noted in the HPI. Review of Systems  Objective: Vital Signs: There were no vitals taken for this visit.  Specialty Comments:  No specialty comments available.  PMFS History: Patient Active Problem List   Diagnosis Date Noted   Constipation  03/31/2023   Cutaneous abscess of right foot 03/28/2023   Coping style affecting medical condition 03/21/2023   Osteomyelitis of left lower extremity (HCC) 03/18/2023   Gangrene of left foot (HCC) 03/12/2023   Renal insufficiency 03/12/2023   PVD (peripheral vascular disease) (HCC) 03/12/2023   Hyponatremia 03/12/2023   Normocytic anemia 03/12/2023   Lymphedema 03/04/2023   Eye injury 11/21/2022   Hyperkalemia 07/10/2022   Chronic painful diabetic neuropathy (HCC) 07/10/2022   Metabolic acidosis, normal anion gap (NAG) 07/10/2022   Hypomagnesemia 07/10/2022   Charcot's arthropathy 06/28/2022   Leukocytosis    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)    Severe protein-calorie malnutrition (HCC)    Diabetic foot infection (HCC) 06/22/2022   Sepsis (HCC) 06/22/2022   Cellulitis in diabetic foot (HCC) 04/17/2021   Osteomyelitis of left foot (HCC) 04/14/2021   HTN (hypertension) 04/08/2021   HLD (hyperlipidemia) 04/08/2021   Alcohol abuse    Diabetic ulcer of right foot (HCC)    Diabetes mellitus type II, controlled (HCC) 01/27/2014   AKI (acute kidney injury) (HCC) 01/27/2014   Past Medical History:  Diagnosis Date   AKI (acute kidney injury) (HCC) 01/27/2014   Alcohol abuse    Anasarca    Bilateral leg edema 02/07/2021   Cellulitis    Cellulitis and abscess 01/26/2014   Cellulitis of right foot 04/17/2021   Dental abscess 01/26/2014   Diabetes mellitus type 2 in obese    Diabetic foot infection (HCC) 04/08/2021   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 2 diabetes mellitus, limited to breakdown of skin (HCC)    Facial cellulitis 01/26/2014   HTN (hypertension)    Hypokalemia 01/28/2014   Infected dental carries 01/26/2014   Leukocytosis 01/27/2014   Osteomyelitis (HCC) 04/14/2021   Right foot infection     History reviewed. No pertinent family history.  Past Surgical History:  Procedure Laterality Date    ABDOMINAL AORTOGRAM W/LOWER EXTREMITY N/A 03/06/2023   Procedure: ABDOMINAL AORTOGRAM W/LOWER EXTREMITY;  Surgeon: Cephus Shelling, MD;  Location: [facility];  Service: Cardiovascular;  Laterality: N/A;   AMPUTATION Left 03/15/2023   Procedure: AMPUTATION BELOW KNEE;  Surgeon: Nadara Mustard, MD;  Location: [facility] OR;  Service: Orthopedics;  Laterality: Left;   I & D EXTREMITY Right 04/18/2021   Procedure: IRRIGATION AND DEBRIDEMENT OF FOOT;  Surgeon: Nadara Mustard, MD;  Location: [facility] OR;  Service: Orthopedics;  Laterality: Right;   I & D EXTREMITY Right 04/20/2021  Procedure: EXCISIONAL DEBRIDEMENT RIGHT FOOT, APPLICATION OF SKIN GRAFT;  Surgeon: Nadara Mustard, MD;  Location: [facility] OR;  Service: Orthopedics;  Laterality: Right;   I & D EXTREMITY Right 03/28/2023   Procedure: RIGHT HEEL DEBRIDEMENT;  Surgeon: Nadara Mustard, MD;  Location: [facility] OR;  Service: Orthopedics;  Laterality: Right;   PERIPHERAL VASCULAR INTERVENTION  03/06/2023   Procedure: PERIPHERAL VASCULAR INTERVENTION;  Surgeon: Cephus Shelling, MD;  Location: [facility];  Service: Cardiovascular;;   Social History   Occupational History   Occupation: Curator  Tobacco Use   Smoking status: Never   Smokeless tobacco: Never  Vaping Use   Vaping status: Never Used  Substance and Sexual Activity   Alcohol use: Not Currently    Alcohol/week: 2.0 standard drinks of alcohol    Types: 2 Cans of beer per week    Comment: Occasional   Drug use: No   Sexual activity: Not Currently    Partners: Female

## 2024-01-19 ENCOUNTER — Encounter: Payer: Self-pay | Admitting: Physical Therapy

## 2024-01-19 ENCOUNTER — Ambulatory Visit: Payer: Medicaid Other | Admitting: Physical Therapy

## 2024-01-19 VITALS — BP 157/78 | HR 86

## 2024-01-19 DIAGNOSIS — R293 Abnormal posture: Secondary | ICD-10-CM

## 2024-01-19 DIAGNOSIS — M6281 Muscle weakness (generalized): Secondary | ICD-10-CM

## 2024-01-19 DIAGNOSIS — R2681 Unsteadiness on feet: Secondary | ICD-10-CM | POA: Diagnosis present

## 2024-01-19 DIAGNOSIS — R262 Difficulty in walking, not elsewhere classified: Secondary | ICD-10-CM

## 2024-01-19 NOTE — Patient Instructions (Signed)
Do each exercise 1-2  times per day Do each exercise 10 repetitions Hold each exercise for 2 seconds to feel your location  AT SINK FIND YOUR MIDLINE POSITION AND PLACE FEET EQUAL DISTANCE FROM THE MIDLINE.  Try to find this position when standing still for activities.   USE TAPE ON FLOOR TO MARK THE MIDLINE POSITION. You also should try to feel with your limb pressure in socket.  You are trying to feel with limb what you used to feel with the bottom of your foot.  Side to Side Shift: Moving your hips only (not shoulders): move weight onto your left leg, HOLD/FEEL.  Move back to equal weight on each leg, HOLD/FEEL. Move weight onto your right leg, HOLD/FEEL. Move back to equal weight on each leg, HOLD/FEEL. Repeat.  Start with both hands on sink, progress to right hand only, then no hands.  Front to Back Shift: Moving your hips only (not shoulders): move your weight forward onto your toes, HOLD/FEEL. Move your weight back to equal Flat Foot on both legs, HOLD/FEEL. Move your weight back onto your heels, HOLD/FEEL. Move your weight back to equal on both legs, HOLD/FEEL. Repeat.   tart with both hands on sink, progress to right hand only, then no hands. Moving Cones / Cups: With equal weight on each leg: Hold on with one hand the first time, then progress to no hand supports. Move cups from one side of sink to the other. Place cups ~2" out of your reach, progress to 10" beyond reach.  Place one hand in middle of sink and reach with other hand. Do both arms.  Then hover one hand and move cups with other hand.  Overhead/Upward Reaching: alternated reaching up to top cabinets or ceiling if no cabinets present. Keep equal weight on each leg. Start with one hand support on counter while other hand reaches and progress to no hand support with reaching.  ace one hand in middle of sink and reach with other hand. Do both arms.  Then hover one hand and move cups with other hand.  5.   Looking Over Shoulders: With  equal weight on each leg: alternate turning to look over your shoulders with one hand support on counter as needed.  Start with head motions only to look in front of shoulder, then even with shoulder and progress to looking behind you. To look to side, move head /eyes, then shoulder on side looking pulls back, shift more weight to side looking and pull hip back. ace one hand in middle of sink and let go with other hand so your shoulder can pull back. Switch hands to look other way.   Then hover one hand and move cups with other hand.  6.  Stepping with leg that is not amputated:  Move items under cabinet out of your way. Shift your hips/pelvis so weight on prosthesis. SLOWLY step other leg so front of foot is in cabinet. Then step back to floor.   

## 2024-01-19 NOTE — Therapy (Signed)
 OUTPATIENT PHYSICAL THERAPY NEURO TREATMENT   Patient Name: Jesse [name] MRN: [id number] DOB:November 26, 1960, 63 y.o., male Today's Date: 01/19/2024   PCP: none reported REFERRING PROVIDER: Barnie Del, NP  END OF SESSION:  PT End of Session - 01/19/24 1026     Visit Number 6    Number of Visits 9    Date for PT Re-Evaluation 02/06/24    Authorization Type medicaid    Authorization - Number of Visits 27    Progress Note Due on Visit 10    PT Start Time 1017    PT Stop Time 1055    PT Time Calculation (min) 38 min    Equipment Utilized During Treatment Gait belt    Activity Tolerance Patient tolerated treatment well    Behavior During Therapy WFL for tasks assessed/performed             Past Medical History:  Diagnosis Date   AKI (acute kidney injury) (HCC) 01/27/2014   Alcohol abuse    Anasarca    Bilateral leg edema 02/07/2021   Cellulitis    Cellulitis and abscess 01/26/2014   Cellulitis of right foot 04/17/2021   Dental abscess 01/26/2014   Diabetes mellitus type 2 in obese    Diabetic foot infection (HCC) 04/08/2021   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 2 diabetes mellitus, limited to breakdown of skin (HCC)    Facial cellulitis 01/26/2014   HTN (hypertension)    Hypokalemia 01/28/2014   Infected dental carries 01/26/2014   Leukocytosis 01/27/2014   Osteomyelitis (HCC) 04/14/2021   Right foot infection    Past Surgical History:  Procedure Laterality Date   ABDOMINAL AORTOGRAM W/LOWER EXTREMITY N/A 03/06/2023   Procedure: ABDOMINAL AORTOGRAM W/LOWER EXTREMITY;  Surgeon: Cephus Shelling, MD;  Location: [facility];  Service: Cardiovascular;  Laterality: N/A;   AMPUTATION Left 03/15/2023   Procedure: AMPUTATION BELOW KNEE;  Surgeon: Nadara Mustard, MD;  Location: [facility] OR;  Service: Orthopedics;  Laterality: Left;   I & D EXTREMITY Right 04/18/2021   Procedure: IRRIGATION AND DEBRIDEMENT OF FOOT;  Surgeon: Nadara Mustard, MD;  Location: [facility] OR;   Service: Orthopedics;  Laterality: Right;   I & D EXTREMITY Right 04/20/2021   Procedure: EXCISIONAL DEBRIDEMENT RIGHT FOOT, APPLICATION OF SKIN GRAFT;  Surgeon: Nadara Mustard, MD;  Location: [facility] OR;  Service: Orthopedics;  Laterality: Right;   I & D EXTREMITY Right 03/28/2023   Procedure: RIGHT HEEL DEBRIDEMENT;  Surgeon: Nadara Mustard, MD;  Location: [facility] OR;  Service: Orthopedics;  Laterality: Right;   PERIPHERAL VASCULAR INTERVENTION  03/06/2023   Procedure: PERIPHERAL VASCULAR INTERVENTION;  Surgeon: Cephus Shelling, MD;  Location: [facility];  Service: Cardiovascular;;   Patient Active Problem List   Diagnosis Date Noted   Constipation 03/31/2023   Cutaneous abscess of right foot 03/28/2023   Coping style affecting medical condition 03/21/2023   Osteomyelitis of left lower extremity (HCC) 03/18/2023   Gangrene of left foot (HCC) 03/12/2023   Renal insufficiency 03/12/2023   PVD (peripheral vascular disease) (HCC) 03/12/2023   Hyponatremia 03/12/2023   Normocytic anemia 03/12/2023   Lymphedema 03/04/2023   Eye injury 11/21/2022   Hyperkalemia 07/10/2022   Chronic painful diabetic neuropathy (HCC) 07/10/2022   Metabolic acidosis, normal anion gap (NAG) 07/10/2022   Hypomagnesemia 07/10/2022   Charcot's arthropathy 06/28/2022   Leukocytosis    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)  Severe protein-calorie malnutrition (HCC)    Diabetic foot infection (HCC) 06/22/2022   Sepsis (HCC) 06/22/2022   Cellulitis in diabetic foot (HCC) 04/17/2021   Osteomyelitis of left foot (HCC) 04/14/2021   HTN (hypertension) 04/08/2021   HLD (hyperlipidemia) 04/08/2021   Alcohol abuse    Diabetic ulcer of right foot (HCC)    Diabetes mellitus type II, controlled (HCC) 01/27/2014   AKI (acute kidney injury) (HCC) 01/27/2014    ONSET DATE: 12/03/23 referral  REFERRING DIAG: G29.528 (ICD-10-CM) - Acquired absence of left leg  below knee (HCC)   THERAPY DIAG:  Abnormal posture  Muscle weakness (generalized)  Difficulty in walking, not elsewhere classified  Unsteadiness on feet  Rationale for Evaluation and Treatment: Rehabilitation  SUBJECTIVE:                                                                                                                                                                                             SUBJECTIVE STATEMENT: Patient arrives to clinic in manual wheelchair w/ arm rests back - prosthetic donned.  He brought bag of socks.  Denies falls.  He had adjustments made to socket and foot at [facility] - he is unsure what was done.  Pt wearing 1-ply today. Pt accompanied by: self  PERTINENT HISTORY: AKI, alcohol abuse, DMII, HTN, hypokalemia, osteomyelitis  PAIN:  Are you having pain? No  PRECAUTIONS: Fall and Other: L BKA, R DARCO shoe   PATIENT GOALS: "to walk better"  OBJECTIVE:  Note: Objective measures were completed at Evaluation unless otherwise noted.  DIAGNOSTIC FINDINGS: R heel MRI  IMPRESSION: Plantar soft tissue ulcers, largest along the plantar heel, extending to within 6 mm of the underlying proximal plantar fascia. Minimal subcortical marrow edema in the posterior plantar calcaneus, similar to prior exam, favored to reflect reactive marrow change. No definitive osteomyelitis. No evidence of an organized soft tissue abscess.                                                                                                                             TREATMENT Vitals on  LUE:   Vitals:   01/19/24 1030  BP: (!) 157/78  Pulse: 86   Upon assessing socket it appears 2 inserts were added b/w hard outer socket and flexible inner socket to offload tibia.  Foot component feels more dorsiflexed than at prior PT visit.  Educated pt on this and how it relates to his normal walking and standing.  Pt reports his foot feels like it makes him limp.  GAIT: Gait pattern:   IR of left foot, step through pattern, decreased stride length, trunk flexed, and wide BOS Distance walked: 230 ft Assistive device utilized: Walker - 2 wheeled Level of assistance: SBA and CGA Comments: Pt moves w/ swift pace and appears to be rolling into push off more efficiently on prosthetic side.  Prosthetic ankle appears more DF assisting with this, no notable limp as reported.  Slight IR noted in static and dynamic alignment (no socket wiggle) suggesting foot component adjusted inward too much - educated pt on this and need for making [facility] appt to have this fixed due to trip hazard - he reports he will do this later today.     -Sink HEP reviewed  PATIENT EDUCATION: Education details: Use 2WW to ambulate vs crutches - PT w/ concerns for endurance.  Continue HEP.  Sock management and skin checks.  Re-iterated shrinker on for all hours prosthetic is not on and not sleeping in prosthetic.  Re-iterated safety with Ace bandage or not using at all - pt verbalizes correct wrapping technique as previously educated.  Encouraged wearing prosthetic for all awake hours so long as no skin breakdown - discussed removing prosthetic if blisters or other skin issues or severe pain.  Offered to call [facility] regarding IR of prosthetic foot - he states he will do that this afternoon and PT does not need to. Person educated: Patient Education method: Explanation Education comprehension: verbalized understanding and needs further education  HOME EXERCISE PROGRAM: *Pt declined printout 2/17 -Standing forearm pushups on walker (from forward bent trunk into tall standing)  -STS 4x5 (push-to-stand) - reviewed proper stand technique to 2WW  -Sink HEP  GOALS: Goals reviewed with patient? Yes  SHORT TERM GOALS: Target date: 01/09/24  Pt will be independent with initial HEP for improved functional strength and mobility  Baseline: to be provided Goal status: INITIAL  2.  Pt will improve 5x STS to </= 40 sec to  demo improved functional LE strength and balance   Baseline: 52.75s  Goal status: INITIAL  3.  Gait goal Baseline: to be assessed Goal status: INITIAL  4.  Transfer goal Baseline: to be assessed Goal status: INITIAL    LONG TERM GOALS: Target date: 02/06/24  Pt will be independent with initial HEP for improved functional strength and mobility  Baseline: to be provided Goal status: INITIAL  Pt will improve 5x STS to </= 30 sec to demo improved functional LE strength and balance   Baseline: 52.75s  Goal status: INITIAL  Gait goal Baseline: to be assessed Goal status: INITIAL  Transfer goal Baseline: to be assessed Goal status: INITIAL  ASSESSMENT:  CLINICAL IMPRESSION: Recent prosthetic adjustments seem to have helped pt's wear tolerance today.  He is able to don and ambulate well in prosthetic with only minor left foot internal rotation noted which PT is concerned about due to pt fall risk.  He is planning to schedule [facility] appt to modify this.  Reviewed sink HEP to allow pt to practice weight bearing at home.  He appeared challenged  by prolonged weight bearing mobility today suggesting ongoing low activity tolerance.  PT to continue addressing this and other deficits as outlined in ongoing skilled PT POC.  OBJECTIVE IMPAIRMENTS: Abnormal gait, cardiopulmonary status limiting activity, decreased activity tolerance, decreased balance, decreased endurance, decreased knowledge of condition, decreased knowledge of use of DME, decreased mobility, difficulty walking, decreased strength, and prosthetic dependency .   ACTIVITY LIMITATIONS: carrying, lifting, squatting, stairs, transfers, locomotion level, and caring for others  PARTICIPATION LIMITATIONS: meal prep, cleaning, interpersonal relationship, driving, shopping, and community activity  PERSONAL FACTORS: Past/current experiences, Social background, Time since onset of injury/illness/exacerbation, Transportation, and 3+  comorbidities: see above  are also affecting patient's functional outcome.   REHAB POTENTIAL: Fair wounds, BP  CLINICAL DECISION MAKING: Stable/uncomplicated  EVALUATION COMPLEXITY: Low  PLAN:  PT FREQUENCY: 1x/week  PT DURATION: 8 weeks  PLANNED INTERVENTIONS: 97164- PT Re-evaluation, 97110-Therapeutic exercises, 97530- Therapeutic activity, 97112- Neuromuscular re-education, 97535- Self Care, 16109- Manual therapy, [phone]- Gait training, [phone]- Orthotic Fit/training, [phone]- Prosthetic training, Patient/Family education, Balance training, Stair training, DME instructions, and Wheelchair mobility training  PLAN FOR NEXT SESSION: CHECK BP, Gait training w/ 2WW, static balance, assess transfers, HEP, wear schedule?  Skin integrity?  Did he call [facility] about prosthetic foot IR?   Sadie Haber, PT, DPT  01/19/2024, 11:04 AM   For all possible CPT codes, reference the Planned Interventions line above.     Check all conditions that are expected to impact treatment: {Conditions expected to impact treatment:Diabetes mellitus, Musculoskeletal disorders, Social determinants of health, and Presence of Medical Equipment   If treatment provided at initial evaluation, no treatment charged due to lack of authorization.

## 2024-01-26 ENCOUNTER — Ambulatory Visit: Payer: Medicaid Other | Admitting: Physical Therapy

## 2024-02-02 ENCOUNTER — Ambulatory Visit: Payer: Medicaid Other | Admitting: Physical Therapy

## 2024-02-11 ENCOUNTER — Ambulatory Visit: Admitting: Family

## 2024-02-11 DIAGNOSIS — Z89512 Acquired absence of left leg below knee: Secondary | ICD-10-CM

## 2024-02-11 DIAGNOSIS — I890 Lymphedema, not elsewhere classified: Secondary | ICD-10-CM

## 2024-02-11 DIAGNOSIS — R600 Localized edema: Secondary | ICD-10-CM | POA: Diagnosis not present

## 2024-02-11 DIAGNOSIS — F54 Psychological and behavioral factors associated with disorders or diseases classified elsewhere: Secondary | ICD-10-CM | POA: Diagnosis not present

## 2024-02-11 DIAGNOSIS — S88112D Complete traumatic amputation at level between knee and ankle, left lower leg, subsequent encounter: Secondary | ICD-10-CM

## 2024-02-16 ENCOUNTER — Encounter: Payer: Self-pay | Admitting: Physical Therapy

## 2024-02-16 ENCOUNTER — Telehealth: Payer: Self-pay | Admitting: Orthopedic Surgery

## 2024-02-16 DIAGNOSIS — R262 Difficulty in walking, not elsewhere classified: Secondary | ICD-10-CM

## 2024-02-16 DIAGNOSIS — R293 Abnormal posture: Secondary | ICD-10-CM

## 2024-02-16 DIAGNOSIS — M6281 Muscle weakness (generalized): Secondary | ICD-10-CM

## 2024-02-16 DIAGNOSIS — R2681 Unsteadiness on feet: Secondary | ICD-10-CM

## 2024-02-16 NOTE — Therapy (Signed)
 [facility] [address] [place], [place], [address] Phone: [phone]   Fax:  [phone]  Patient Details - PT DISCHARGE Name: Jesse [name] MRN: [id number] Date of Birth: 09-05-1961 Referring Provider:  No ref. provider found  Encounter Date: 02/16/2024  Pt no showed remaining scheduled appts without notifying office to reschedule.  Discharging due to no show policy and extended period without being seen in clinic and pt will need new referral to return for evaluation.  Discussed with pt 02/16/2024.  Sadie Haber, PT, DPT 02/16/2024, 10:31 AM  [facility] [address] [place], [place], [address] Phone: [phone]   Fax:  [phone]

## 2024-02-16 NOTE — Telephone Encounter (Signed)
 Pt came in stating he need a new referral for physical therapy for prostatic lg. Pt states locations is [address] in [place] Pt phone number is [phone].

## 2024-02-17 ENCOUNTER — Encounter: Payer: Self-pay | Admitting: Family

## 2024-02-17 NOTE — Progress Notes (Signed)
 Office Visit Note   Patient: Jesse [name]           Date of Birth: 1961/07/20           MRN: [id number] Visit Date: 02/11/2024              Requested by: No referring provider defined for this encounter. PCP: System, Provider Not In  Chief Complaint  Patient presents with   Left Leg - Follow-up      HPI: The patient is a 63 year old gentleman who is well-known to our clinic he is status post left below-knee amputation.  He has been having difficulty with the edema to bilateral lower extremities unfortunately he does reside at skilled nursing and spends the majority of his time in a chair with his legs dependent.  He has difficulty initially donning his prosthesis due to edema and has discomfort associated with this.  He also reports that he rapidly loses volume and then has subsequent pain due to ill fit due to volume loss and from weightbearing and wearing his prosthesis.  Assessment & Plan: Visit Diagnoses: No diagnosis found.  Plan: Encouraged him to elevate his lower extremities to wear compression around-the-clock to bilateral lower extremities to minimize time with legs dependent.  He does have a new bed with elevation option for both lower extremities.  Encouraged him to wear his prosthesis during waking hours  Has planned follow-up with [facility] today.  Follow-Up Instructions: No follow-ups on file.   Ortho Exam  Patient is alert, oriented, no adenopathy, well-dressed, normal affect, normal respiratory effort. On examination left residual limb this is well-healed he does have significant edema there is no weeping no open area  Imaging: No results found. No images are attached to the encounter.  Labs: Lab Results  Component Value Date   HGBA1C 6.9 (H) 11/21/2022   HGBA1C 6.3 10/10/2022   HGBA1C 8.3 (H) 06/20/2022   ESRSEDRATE 132 (H) 03/03/2023   ESRSEDRATE 137 (H) 07/08/2022   ESRSEDRATE 107 (H) 06/27/2022   CRP 14.1 (H) 07/08/2022   CRP 16.9 (H)  06/27/2022   CRP 27.2 (H) 04/08/2021   LABURIC 5.3 07/04/2022   LABURIC 7.0 01/14/2016   REPTSTATUS 03/31/2023 FINAL 03/28/2023   GRAMSTAIN  03/28/2023    FEW WBC PRESENT, PREDOMINANTLY PMN ABUNDANT GRAM POSITIVE COCCI IN PAIRS IN CHAINS IN CLUSTERS ABUNDANT GRAM NEGATIVE RODS FEW GRAM NEGATIVE COCCOBACILLI Performed at [facility], [address], [place], [place] [address]    CULT (A) 03/28/2023    MULTIPLE ORGANISMS PRESENT, NONE PREDOMINANT NO STAPHYLOCOCCUS AUREUS ISOLATED NO GROUP A STREP (S.PYOGENES) ISOLATED MIXED ANAEROBIC FLORA PRESENT.  CALL LAB IF FURTHER IID REQUIRED.    LABORGA PROTEUS MIRABILIS 04/18/2021     Lab Results  Component Value Date   ALBUMIN 3.4 (L) 09/15/2023   ALBUMIN 2.5 (L) 03/31/2023   ALBUMIN 2.3 (L) 03/27/2023    Lab Results  Component Value Date   MG 1.6 (L) 03/31/2023   MG 1.9 03/18/2023   MG 1.6 (L) 03/17/2023   No results found for: "VD25OH"  No results found for: "PREALBUMIN"    Latest Ref Rng & Units 09/15/2023    6:05 PM 04/12/2023   11:45 PM 04/02/2023    5:54 AM  CBC EXTENDED  WBC 4.0 - 10.5 K/uL 7.1  6.6  10.0   RBC 4.22 - 5.81 MIL/uL 3.98  2.85  2.25   Hemoglobin 13.0 - 17.0 g/dL 29.5  9.1  7.1  HCT 39.0 - 52.0 % 37.5  28.8  22.2   Platelets 150 - 400 K/uL 216  263  340   NEUT# 1.7 - 7.7 K/uL  3.1    Lymph# 0.7 - 4.0 K/uL  2.3       There is no height or weight on file to calculate BMI.  Orders:  No orders of the defined types were placed in this encounter.  No orders of the defined types were placed in this encounter.    Procedures: No procedures performed  Clinical Data: No additional findings.  ROS:  All other systems negative, except as noted in the HPI. Review of Systems  Objective: Vital Signs: There were no vitals taken for this visit.  Specialty Comments:  No specialty comments available.  PMFS History: Patient Active Problem List   Diagnosis Date Noted   Constipation 03/31/2023    Cutaneous abscess of right foot 03/28/2023   Coping style affecting medical condition 03/21/2023   Osteomyelitis of left lower extremity (HCC) 03/18/2023   Gangrene of left foot (HCC) 03/12/2023   Renal insufficiency 03/12/2023   PVD (peripheral vascular disease) (HCC) 03/12/2023   Hyponatremia 03/12/2023   Normocytic anemia 03/12/2023   Lymphedema 03/04/2023   Eye injury 11/21/2022   Hyperkalemia 07/10/2022   Chronic painful diabetic neuropathy (HCC) 07/10/2022   Metabolic acidosis, normal anion gap (NAG) 07/10/2022   Hypomagnesemia 07/10/2022   Charcot's arthropathy 06/28/2022   Leukocytosis    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)    Severe protein-calorie malnutrition (HCC)    Diabetic foot infection (HCC) 06/22/2022   Sepsis (HCC) 06/22/2022   Cellulitis in diabetic foot (HCC) 04/17/2021   Osteomyelitis of left foot (HCC) 04/14/2021   HTN (hypertension) 04/08/2021   HLD (hyperlipidemia) 04/08/2021   Alcohol abuse    Diabetic ulcer of right foot (HCC)    Diabetes mellitus type II, controlled (HCC) 01/27/2014   AKI (acute kidney injury) (HCC) 01/27/2014   Past Medical History:  Diagnosis Date   AKI (acute kidney injury) (HCC) 01/27/2014   Alcohol abuse    Anasarca    Bilateral leg edema 02/07/2021   Cellulitis    Cellulitis and abscess 01/26/2014   Cellulitis of right foot 04/17/2021   Dental abscess 01/26/2014   Diabetes mellitus type 2 in obese    Diabetic foot infection (HCC) 04/08/2021   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 2 diabetes mellitus, limited to breakdown of skin (HCC)    Facial cellulitis 01/26/2014   HTN (hypertension)    Hypokalemia 01/28/2014   Infected dental carries 01/26/2014   Leukocytosis 01/27/2014   Osteomyelitis (HCC) 04/14/2021   Right foot infection     No family history on file.  Past Surgical History:  Procedure Laterality Date   ABDOMINAL AORTOGRAM W/LOWER  EXTREMITY N/A 03/06/2023   Procedure: ABDOMINAL AORTOGRAM W/LOWER EXTREMITY;  Surgeon: Cephus Shelling, MD;  Location: [facility];  Service: Cardiovascular;  Laterality: N/A;   AMPUTATION Left 03/15/2023   Procedure: AMPUTATION BELOW KNEE;  Surgeon: Nadara Mustard, MD;  Location: [facility] OR;  Service: Orthopedics;  Laterality: Left;   I & D EXTREMITY Right 04/18/2021   Procedure: IRRIGATION AND DEBRIDEMENT OF FOOT;  Surgeon: Nadara Mustard, MD;  Location: [facility] OR;  Service: Orthopedics;  Laterality: Right;   I & D EXTREMITY Right 04/20/2021   Procedure: EXCISIONAL DEBRIDEMENT RIGHT FOOT, APPLICATION OF SKIN GRAFT;  Surgeon: Nadara Mustard, MD;  Location: [facility] OR;  Service: Orthopedics;  Laterality: Right;   I & D EXTREMITY Right 03/28/2023   Procedure: RIGHT HEEL DEBRIDEMENT;  Surgeon: Nadara Mustard, MD;  Location: [facility] OR;  Service: Orthopedics;  Laterality: Right;   PERIPHERAL VASCULAR INTERVENTION  03/06/2023   Procedure: PERIPHERAL VASCULAR INTERVENTION;  Surgeon: Cephus Shelling, MD;  Location: [facility];  Service: Cardiovascular;;   Social History   Occupational History   Occupation: Curator  Tobacco Use   Smoking status: Never   Smokeless tobacco: Never  Vaping Use   Vaping status: Never Used  Substance and Sexual Activity   Alcohol use: Not Currently    Alcohol/week: 2.0 standard drinks of alcohol    Types: 2 Cans of beer per week    Comment: Occasional   Drug use: No   Sexual activity: Not Currently    Partners: Female

## 2024-02-18 ENCOUNTER — Other Ambulatory Visit: Payer: Self-pay | Admitting: Orthopedic Surgery

## 2024-02-18 DIAGNOSIS — S88112D Complete traumatic amputation at level between knee and ankle, left lower leg, subsequent encounter: Secondary | ICD-10-CM

## 2024-02-18 NOTE — Telephone Encounter (Signed)
 Referral placed.

## 2024-02-18 NOTE — Telephone Encounter (Signed)
 Pt has an apt here in office tomorrow, will notify him.

## 2024-02-19 ENCOUNTER — Ambulatory Visit: Payer: Medicaid Other | Admitting: Orthopedic Surgery

## 2024-03-04 ENCOUNTER — Encounter: Payer: Self-pay | Admitting: Orthopedic Surgery

## 2024-03-04 ENCOUNTER — Ambulatory Visit: Payer: Medicaid Other | Admitting: Orthopedic Surgery

## 2024-03-04 DIAGNOSIS — Z89512 Acquired absence of left leg below knee: Secondary | ICD-10-CM

## 2024-03-04 DIAGNOSIS — R600 Localized edema: Secondary | ICD-10-CM | POA: Diagnosis not present

## 2024-03-04 DIAGNOSIS — S88112D Complete traumatic amputation at level between knee and ankle, left lower leg, subsequent encounter: Secondary | ICD-10-CM

## 2024-03-04 NOTE — Progress Notes (Signed)
 Office Visit Note   Patient: Jesse [name]           Date of Birth: 1961-07-28           MRN: [id number] Visit Date: 03/04/2024              Requested by: No referring provider defined for this encounter. PCP: System, Provider Not In  Chief Complaint  Patient presents with   Left Leg - Follow-up    Hx left BKA      HPI: Patient is a 63 year old gentleman with a left transtibial amputation.  Patient has had modifications with [facility] with socket fit.  He was referred to physical therapy 2 weeks ago.  Patient states his leg is not painful but is having difficulty with swelling and prosthetic fitting.  Assessment & Plan: Visit Diagnoses:  1. Below-knee amputation of left lower extremity, subsequent encounter (HCC)   2. Peripheral edema     Plan: Patient was provided a prescription for supplies for the left below-knee amputation and a prescription for extra-depth shoes for the right foot.  Discussed the importance of elevation to help with the swelling.  Follow-Up Instructions: No follow-ups on file.   Ortho Exam  Patient is alert, oriented, no adenopathy, well-dressed, normal affect, normal respiratory effort. Examination patient has a stable left transtibial amputation.  Currently the prosthesis is fitting well.  There is no pain no ulcers.  Patient states that he sits all day long with his legs dependent.  His right calf is 51 cm in circumference.  There are no open ulcers.  With the swelling in the patient's right foot he is unable to fit into a regular shoe.  Patient is at risk of skin breakdown and potential amputation of the right leg if we cannot get him into a good fitting pair of shoes on the right.  Patient is an existing left transtibial  amputee.  Patient's current comorbidities are not expected to impact the ability to function with the prescribed prosthesis. Patient verbally communicates a strong desire to use a prosthesis. Patient currently requires mobility  aids to ambulate without a prosthesis.  Expects not to use mobility aids with a new prosthesis. Patient is expected to resume or reach their K Level within 6 months. Patient was active before the amputation and independent with stairs, uneven terrain, varying cadence, and a community ambulator.  Patient is a K2 level ambulator that will use a prosthesis to walk around their home and the community over low level environmental barriers.      Imaging: No results found. No images are attached to the encounter.  Labs: Lab Results  Component Value Date   HGBA1C 6.9 (H) 11/21/2022   HGBA1C 6.3 10/10/2022   HGBA1C 8.3 (H) 06/20/2022   ESRSEDRATE 132 (H) 03/03/2023   ESRSEDRATE 137 (H) 07/08/2022   ESRSEDRATE 107 (H) 06/27/2022   CRP 14.1 (H) 07/08/2022   CRP 16.9 (H) 06/27/2022   CRP 27.2 (H) 04/08/2021   LABURIC 5.3 07/04/2022   LABURIC 7.0 01/14/2016   REPTSTATUS 03/31/2023 FINAL 03/28/2023   GRAMSTAIN  03/28/2023    FEW WBC PRESENT, PREDOMINANTLY PMN ABUNDANT GRAM POSITIVE COCCI IN PAIRS IN CHAINS IN CLUSTERS ABUNDANT GRAM NEGATIVE RODS FEW GRAM NEGATIVE COCCOBACILLI Performed at [facility], [address] [place], [place] [address]    CULT (A) 03/28/2023    MULTIPLE ORGANISMS PRESENT, NONE PREDOMINANT NO STAPHYLOCOCCUS AUREUS ISOLATED NO GROUP A STREP (S.PYOGENES) ISOLATED MIXED ANAEROBIC FLORA PRESENT.  CALL LAB IF FURTHER IID REQUIRED.    LABORGA PROTEUS MIRABILIS 04/18/2021     Lab Results  Component Value Date   ALBUMIN 3.4 (L) 09/15/2023   ALBUMIN 2.5 (L) 03/31/2023   ALBUMIN 2.3 (L) 03/27/2023    Lab Results  Component Value Date   MG 1.6 (L) 03/31/2023   MG 1.9 03/18/2023   MG 1.6 (L) 03/17/2023   No results found for: "VD25OH"  No results found for: "PREALBUMIN"    Latest Ref Rng & Units 09/15/2023    6:05 PM 04/12/2023   11:45 PM 04/02/2023    5:54 AM  CBC EXTENDED  WBC 4.0 - 10.5 K/uL 7.1  6.6  10.0   RBC 4.22 - 5.81 MIL/uL 3.98  2.85   2.25   Hemoglobin 13.0 - 17.0 g/dL 16.1  9.1  7.1   HCT 09.6 - 52.0 % 37.5  28.8  22.2   Platelets 150 - 400 K/uL 216  263  340   NEUT# 1.7 - 7.7 K/uL  3.1    Lymph# 0.7 - 4.0 K/uL  2.3       There is no height or weight on file to calculate BMI.  Orders:  No orders of the defined types were placed in this encounter.  No orders of the defined types were placed in this encounter.    Procedures: No procedures performed  Clinical Data: No additional findings.  ROS:  All other systems negative, except as noted in the HPI. Review of Systems  Objective: Vital Signs: There were no vitals taken for this visit.  Specialty Comments:  No specialty comments available.  PMFS History: Patient Active Problem List   Diagnosis Date Noted   Constipation 03/31/2023   Cutaneous abscess of right foot 03/28/2023   Coping style affecting medical condition 03/21/2023   Osteomyelitis of left lower extremity (HCC) 03/18/2023   Gangrene of left foot (HCC) 03/12/2023   Renal insufficiency 03/12/2023   PVD (peripheral vascular disease) (HCC) 03/12/2023   Hyponatremia 03/12/2023   Normocytic anemia 03/12/2023   Lymphedema 03/04/2023   Eye injury 11/21/2022   Hyperkalemia 07/10/2022   Chronic painful diabetic neuropathy (HCC) 07/10/2022   Metabolic acidosis, normal anion gap (NAG) 07/10/2022   Hypomagnesemia 07/10/2022   Charcot's arthropathy 06/28/2022   Leukocytosis    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)    Severe protein-calorie malnutrition (HCC)    Diabetic foot infection (HCC) 06/22/2022   Sepsis (HCC) 06/22/2022   Cellulitis in diabetic foot (HCC) 04/17/2021   Osteomyelitis of left foot (HCC) 04/14/2021   HTN (hypertension) 04/08/2021   HLD (hyperlipidemia) 04/08/2021   Alcohol abuse    Diabetic ulcer of right foot (HCC)    Diabetes mellitus type II, controlled (HCC) 01/27/2014   AKI (acute kidney injury) (HCC)  01/27/2014   Past Medical History:  Diagnosis Date   AKI (acute kidney injury) (HCC) 01/27/2014   Alcohol abuse    Anasarca    Bilateral leg edema 02/07/2021   Cellulitis    Cellulitis and abscess 01/26/2014   Cellulitis of right foot 04/17/2021   Dental abscess 01/26/2014   Diabetes mellitus type 2 in obese    Diabetic foot infection (HCC) 04/08/2021   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 2 diabetes mellitus, limited to breakdown of skin (HCC)    Facial cellulitis 01/26/2014   HTN (hypertension)    Hypokalemia 01/28/2014   Infected dental carries 01/26/2014   Leukocytosis  01/27/2014   Osteomyelitis (HCC) 04/14/2021   Right foot infection     History reviewed. No pertinent family history.  Past Surgical History:  Procedure Laterality Date   ABDOMINAL AORTOGRAM W/LOWER EXTREMITY N/A 03/06/2023   Procedure: ABDOMINAL AORTOGRAM W/LOWER EXTREMITY;  Surgeon: Young Hensen, MD;  Location: [facility];  Service: Cardiovascular;  Laterality: N/A;   AMPUTATION Left 03/15/2023   Procedure: AMPUTATION BELOW KNEE;  Surgeon: Timothy Ford, MD;  Location: [facility] OR;  Service: Orthopedics;  Laterality: Left;   I & D EXTREMITY Right 04/18/2021   Procedure: IRRIGATION AND DEBRIDEMENT OF FOOT;  Surgeon: Timothy Ford, MD;  Location: [facility] OR;  Service: Orthopedics;  Laterality: Right;   I & D EXTREMITY Right 04/20/2021   Procedure: EXCISIONAL DEBRIDEMENT RIGHT FOOT, APPLICATION OF SKIN GRAFT;  Surgeon: Timothy Ford, MD;  Location: [facility] OR;  Service: Orthopedics;  Laterality: Right;   I & D EXTREMITY Right 03/28/2023   Procedure: RIGHT HEEL DEBRIDEMENT;  Surgeon: Timothy Ford, MD;  Location: [facility] OR;  Service: Orthopedics;  Laterality: Right;   PERIPHERAL VASCULAR INTERVENTION  03/06/2023   Procedure: PERIPHERAL VASCULAR INTERVENTION;  Surgeon: Young Hensen, MD;  Location: [facility];  Service: Cardiovascular;;   Social History   Occupational History   Occupation:  Curator  Tobacco Use   Smoking status: Never   Smokeless tobacco: Never  Vaping Use   Vaping status: Never Used  Substance and Sexual Activity   Alcohol use: Not Currently    Alcohol/week: 2.0 standard drinks of alcohol    Types: 2 Cans of beer per week    Comment: Occasional   Drug use: No   Sexual activity: Not Currently    Partners: Female

## 2024-04-02 ENCOUNTER — Emergency Department (HOSPITAL_COMMUNITY)
Admission: EM | Admit: 2024-04-02 | Discharge: 2024-04-02 | Disposition: A | Discharge status: Nursing Home (Includes ICF) | Attending: Emergency Medicine | Admitting: Emergency Medicine

## 2024-04-02 ENCOUNTER — Other Ambulatory Visit: Payer: Self-pay

## 2024-04-02 ENCOUNTER — Encounter (HOSPITAL_COMMUNITY): Payer: Self-pay | Admitting: Emergency Medicine

## 2024-04-02 DIAGNOSIS — R040 Epistaxis: Secondary | ICD-10-CM | POA: Insufficient documentation

## 2024-04-02 DIAGNOSIS — Z794 Long term (current) use of insulin: Secondary | ICD-10-CM | POA: Diagnosis not present

## 2024-04-02 DIAGNOSIS — E119 Type 2 diabetes mellitus without complications: Secondary | ICD-10-CM | POA: Diagnosis not present

## 2024-04-02 DIAGNOSIS — Z7982 Long term (current) use of aspirin: Secondary | ICD-10-CM | POA: Insufficient documentation

## 2024-04-02 DIAGNOSIS — I10 Essential (primary) hypertension: Secondary | ICD-10-CM | POA: Insufficient documentation

## 2024-04-02 LAB — CBC
HCT: 37.2 % — ABNORMAL LOW (ref 39.0–52.0)
Hemoglobin: 12.4 g/dL — ABNORMAL LOW (ref 13.0–17.0)
MCH: 31.4 pg (ref 26.0–34.0)
MCHC: 33.3 g/dL (ref 30.0–36.0)
MCV: 94.2 fL (ref 80.0–100.0)
Platelets: 229 10*3/uL (ref 150–400)
RBC: 3.95 MIL/uL — ABNORMAL LOW (ref 4.22–5.81)
RDW: 13.5 % (ref 11.5–15.5)
WBC: 8 10*3/uL (ref 4.0–10.5)
nRBC: 0 % (ref 0.0–0.2)

## 2024-04-02 MED ORDER — OXYMETAZOLINE HCL 0.05 % NA SOLN
1.0000 | Freq: Once | NASAL | Status: AC
  Administered 2024-04-02: 1 via NASAL
  Filled 2024-04-02: qty 30

## 2024-04-02 MED ORDER — SILVER NITRATE-POT NITRATE 75-25 % EX MISC
1.0000 | Freq: Once | CUTANEOUS | Status: AC
  Administered 2024-04-02: 1 via TOPICAL
  Filled 2024-04-02: qty 1

## 2024-04-02 NOTE — Discharge Instructions (Addendum)
 It was a pleasure taking part in your care.  As we discussed, your workup is reassuring.  Please apply Afrin for 2 days to your right nare and then switch to saline spray.  Please follow-up with the ENT doctor that I referred you to.  Please call and make an appointment to be seen.  Return to the ED with any new or worsening symptoms.  Please read attached concerning nosebleeds.

## 2024-04-02 NOTE — ED Notes (Signed)
 Report called to [facility]

## 2024-04-02 NOTE — ED Triage Notes (Addendum)
 Pt to ED via [facility] from [facility] c/o epistaxis approx 1hr PTA.  Pt was picking nose when it started. Denies injury, dizziness, or pain.  Denies blood thinners.  Given afrin en route by EMS, bleeding slowed down to small trickle.  Gauze packing in place.  EMS vitals: 130 palpated pressure, 96% RA, 104 HR, 98 CBG, 20 RR.

## 2024-04-02 NOTE — ED Provider Notes (Signed)
 [place] EMERGENCY DEPARTMENT AT [facility] Provider Note   CSN: [id number] Arrival date & time: 04/02/24  0016     History  Chief Complaint  Patient presents with   Epistaxis    Jesse [name] is a 63 y.o. male with medical history of hypertension, hyperlipidemia, diabetes, alcohol abuse, osteomyelitis of left foot.  Patient presents to ED for evaluation of epistaxis.  Reports that Jesse [name] was at his nursing facility when Jesse [name] was picking his nose.  Jesse [name] reports that after picking his nose, Jesse [name] began having significant bleeding from his left nare.  Jesse [name] reports history of recurrent nosebleeds.  Last seen for nosebleed in 10/24/2023 and patient was advised to follow-up with ENT.  Patient has not followed up with ENT.  Patient denies blood thinners.  Denies trauma to his face.  Denies lightheadedness, dizziness, weakness, shortness of breath.   Epistaxis      Home Medications Prior to Admission medications   Medication Sig Start Date End Date Taking? Authorizing Provider  acetaminophen  (TYLENOL ) 325 MG tablet Take 1-2 tablets (325-650 mg total) by mouth every 6 (six) hours as needed for mild pain. 04/02/23   Setzer, Hamp Levine, PA-C  artificial tears (LACRILUBE) OINT ophthalmic ointment Place into the right eye every 4 (four) hours as needed for dry eyes. 04/02/23   Setzer, Hamp Levine, PA-C  ascorbic acid  (VITAMIN C ) 1000 MG tablet Take 1 tablet (1,000 mg total) by mouth daily. 03/19/23   Kraig Peru, MD  aspirin  EC 81 MG tablet Take 1 tablet (81 mg total) by mouth daily. Swallow whole. 03/10/23   Samtani, Jai-Gurmukh, MD  atorvastatin  (LIPITOR) 40 MG tablet Take 1 tablet (40 mg total) by mouth at bedtime. 03/10/23   Samtani, Jai-Gurmukh, MD  dorzolamide -timolol  (COSOPT ) 2-0.5 % ophthalmic solution Place 1 drop into the right eye 2 (two) times daily. 11/23/22   Charlott Converse, PA-C  doxycycline  (VIBRA -TABS) 100 MG tablet Take 1 tablet (100 mg total) by mouth every 12 (twelve) hours.  04/02/23   Setzer, Sandra J, PA-C  feeding supplement, GLUCERNA SHAKE, (GLUCERNA SHAKE) LIQD Take 237 mLs by mouth 3 (three) times daily between meals. 03/18/23   Kraig Peru, MD  gabapentin  (NEURONTIN ) 100 MG capsule Take 1 capsule (100 mg total) by mouth 3 (three) times daily. 03/18/23 04/17/23  Kraig Peru, MD  insulin  aspart (NOVOLOG ) 100 UNIT/ML injection Inject 0-9 Units into the skin 3 (three) times daily with meals. 04/02/23   Setzer, Sandra J, PA-C  insulin  glargine-yfgn (SEMGLEE ) 100 UNIT/ML injection Inject 0.05 mLs (5 Units total) into the skin at bedtime. 04/02/23   Setzer, Sandra J, PA-C  Insulin  Pen Needle 32G X 4 MM MISC Use as directed with insulin  pen 07/03/22   Rai, Ripudeep K, MD  magnesium  chloride (SLOW-MAG) 64 MG TBEC SR tablet Take 1 tablet (64 mg total) by mouth 2 (two) times daily. 04/02/23   Setzer, Sandra J, PA-C  Multiple Vitamin (MULTIVITAMIN WITH MINERALS) TABS tablet Take 1 tablet by mouth daily. 03/19/23   Kraig Peru, MD  nutrition supplement, JUVEN, Donell Fuller) PACK Take 1 packet by mouth 2 (two) times daily between meals. 03/18/23   Kraig Peru, MD  oxyCODONE  (OXY IR/ROXICODONE ) 5 MG immediate release tablet Take 1 tablet (5 mg total) by mouth every 6 (six) hours as needed for severe pain (pain score 7-10). 04/02/23   Setzer, Hamp Levine, PA-C  pantoprazole  (PROTONIX ) 40 MG tablet Take 1 tablet (40 mg total) by mouth  daily. 03/19/23   Patel, Pranav M, MD  polyethylene glycol (MIRALAX  / GLYCOLAX ) 17 g packet Take 17 g by mouth daily as needed for mild constipation. 03/18/23   Kraig Peru, MD  zinc  sulfate 220 (50 Zn) MG capsule Take 1 capsule (220 mg total) by mouth daily. 03/19/23   Kraig Peru, MD  glipiZIDE  (GLUCOTROL ) 5 MG tablet Take 1 tablet (5 mg total) by mouth daily before breakfast. Patient not taking: Reported on 01/14/2016 01/31/14 01/14/16  Marilee Showers, MD      Allergies    Patient has no known allergies.    Review of Systems   Review of Systems   HENT:  Positive for nosebleeds.   All other systems reviewed and are negative.   Physical Exam Updated Vital Signs BP (!) 136/106 (BP Location: Right Arm)   Pulse 94   Temp 97.8 F (36.6 C) (Oral)   Resp 18   Ht 6' (1.829 m)   Wt 108.9 kg   SpO2 100%   BMI 32.55 kg/m  Physical Exam Vitals and nursing note reviewed.  Constitutional:      General: Jesse [name] is not in acute distress.    Appearance: Jesse [name] is well-developed.  HENT:     Head: Normocephalic and atraumatic.     Nose:     Comments: Active hemorrhaging from right nare. Eyes:     Conjunctiva/sclera: Conjunctivae normal.  Cardiovascular:     Rate and Rhythm: Normal rate and regular rhythm.     Heart sounds: No murmur heard. Pulmonary:     Effort: Pulmonary effort is normal. No respiratory distress.     Breath sounds: Normal breath sounds.  Abdominal:     Palpations: Abdomen is soft.     Tenderness: There is no abdominal tenderness.  Musculoskeletal:        General: No swelling.     Cervical back: Neck supple.  Skin:    General: Skin is warm and dry.     Capillary Refill: Capillary refill takes less than 2 seconds.  Neurological:     Mental Status: Jesse [name] is alert and oriented to person, place, and time.  Psychiatric:        Mood and Affect: Mood normal.     ED Results / Procedures / Treatments   Labs (all labs ordered are listed, but only abnormal results are displayed) Labs Reviewed  CBC - Abnormal; Notable for the following components:      Result Value   RBC 3.95 (*)    Hemoglobin 12.4 (*)    HCT 37.2 (*)    All other components within normal limits    EKG None  Radiology No results found.  Procedures Procedures   Medications Ordered in ED Medications  oxymetazoline  (AFRIN) 0.05 % nasal spray 1 spray (has no administration in time range)  silver  nitrate applicators applicator 1 Application (has no administration in time range)  oxymetazoline  (AFRIN) 0.05 % nasal spray 1 spray (1 spray Each Nare  Given 04/02/24 0214)    ED Course/ Medical Decision Making/ A&P Clinical Course as of 04/02/24 0425  Fri Apr 02, 2024  0344 After nasal packing with afrin soaked gauze, bleeding has stopped. Large clot removed from nose. Will observe and then dc. [CG]    Clinical Course User Index [CG] Adel Aden, PA-C   Medical Decision Making Amount and/or Complexity of Data Reviewed Labs: ordered.  Risk OTC drugs. Prescription drug management.   63 year old male presents for evaluation of epistaxis.  Please see HPI for further details.  On examination the patient is afebrile and nontachycardic.  His lung sounds are clear bilaterally, Jesse [name] is not hypoxic Ried abdomen soft and compressible.  Neurological examination of baseline.  Active hemorrhaging from right nare.  No evidence of trauma to patient's face.  Denies trauma.  Reports history of recurrent nosebleeds.  Will check basic labs.  Patient had called ball soaked in Afrin and applied to right nare.  Was allowed to sit for 20 minutes with nose clamp.  Bleeding did resolve at this time.  Patient was then observed for 1 hour with no repeat hemorrhaging.  Lab work shows stable hemoglobin of 12.4.  Patient will be referred to ENT this time.  Advised to not pick his nose in the future.  Discharged in stable condition.   Final Clinical Impression(s) / ED Diagnoses Final diagnoses:  Epistaxis    Rx / DC Orders ED Discharge Orders     None         Kristin Peyer 04/02/24 Alethia Andrea, April, MD 04/02/24 [phone]

## 2024-04-02 NOTE — ED Notes (Signed)
 Pt called out stating that his nose was bleeding again. Pt given rag and Veryl Gottron PA made aware. ENT cart brought to bedside per PA order to this RN.

## 2024-04-02 NOTE — ED Notes (Signed)
 Called [facility] to notify that pt was returning back to facility

## 2024-04-27 ENCOUNTER — Ambulatory Visit: Admitting: Orthopedic Surgery

## 2024-04-28 ENCOUNTER — Telehealth: Payer: Self-pay | Admitting: Orthopedic Surgery

## 2024-04-28 NOTE — Telephone Encounter (Signed)
 Pt came into office to state that [facility] is waiting on the insurance papers to be sent over to [facility] so they can get started on pt prosthetic.

## 2024-04-29 ENCOUNTER — Ambulatory Visit: Admitting: Orthopedic Surgery

## 2024-04-29 NOTE — Telephone Encounter (Signed)
 Pt advised that we send rx to [facility].

## 2024-04-30 ENCOUNTER — Telehealth: Payer: Self-pay | Admitting: Orthopedic Surgery

## 2024-04-30 NOTE — Telephone Encounter (Signed)
 Patient called and said that [facility] sent over some  papers for Jesse [name] to sign and was wondering did he receive them for his prosthetic. [id number]#[phone]

## 2024-05-03 NOTE — Telephone Encounter (Signed)
 Form from [facility] in Dr. Harden folder to sign.

## 2024-05-06 ENCOUNTER — Ambulatory Visit: Admitting: Orthopedic Surgery

## 2024-05-17 ENCOUNTER — Ambulatory Visit (INDEPENDENT_AMBULATORY_CARE_PROVIDER_SITE_OTHER): Admitting: Orthopedic Surgery

## 2024-05-17 ENCOUNTER — Encounter: Payer: Self-pay | Admitting: Orthopedic Surgery

## 2024-05-17 DIAGNOSIS — Z89512 Acquired absence of left leg below knee: Secondary | ICD-10-CM | POA: Diagnosis not present

## 2024-05-17 DIAGNOSIS — R600 Localized edema: Secondary | ICD-10-CM

## 2024-05-17 DIAGNOSIS — S88112D Complete traumatic amputation at level between knee and ankle, left lower leg, subsequent encounter: Secondary | ICD-10-CM

## 2024-05-17 NOTE — Progress Notes (Signed)
 Office Visit Note   Patient: Jesse [name]           Date of Birth: [birth year]           MRN: [id number] Visit Date: 05/17/2024              Requested by: No referring provider defined for this encounter. PCP: System, Provider Not In  Chief Complaint  Patient presents with   Left Leg - Follow-up    Hx left BKA       HPI: Patient is a 63 year old gentleman who is seen in follow-up for left below-knee amputation.  Patient has been subsiding into his socket.  Assessment & Plan: Visit Diagnoses:  1. Below-knee amputation of left lower extremity, subsequent encounter [facility])     Plan: Patient is provided a prescription for a new socket and suction liner.  Recommended patient use a under liner for the rash.  Follow-Up Instructions: Return if symptoms worsen or fail to improve.   Ortho Exam  Patient is alert, oriented, no adenopathy, well-dressed, normal affect, normal respiratory effort. Examination patient is subsiding into his socket.  He has a rash over the residual limb from sweating.  Patient also has venous stasis swelling in the right lower extremity and he was given a extra extra-large compression sock.  Patient is an existing left transtibial  amputee.  Patient's current comorbidities are not expected to impact the ability to function with the prescribed prosthesis. Patient verbally communicates a strong desire to use a prosthesis. Patient currently requires mobility aids to ambulate without a prosthesis.  Expects not to use mobility aids with a new prosthesis. Patient is expected to resume or reach their K Level within 6 months. Patient was active before the amputation and independent with stairs, uneven terrain, varying cadence, and a community ambulator.  Patient is a K2 level ambulator that will use a prosthesis to walk around their home and the community over low level environmental barriers.        Imaging: No results found. No images are attached to the  encounter.  Labs: Lab Results  Component Value Date   HGBA1C 6.9 (H) 11/21/2022   HGBA1C 6.3 10/10/2022   HGBA1C 8.3 (H) 06/20/2022   ESRSEDRATE 132 (H) 03/03/2023   ESRSEDRATE 137 (H) 07/08/2022   ESRSEDRATE 107 (H) 06/27/2022   CRP 14.1 (H) 07/08/2022   CRP 16.9 (H) 06/27/2022   CRP 27.2 (H) 04/08/2021   LABURIC 5.3 07/04/2022   LABURIC 7.0 01/14/2016   REPTSTATUS 03/31/2023 FINAL 03/28/2023   GRAMSTAIN  03/28/2023    FEW WBC PRESENT, PREDOMINANTLY PMN ABUNDANT GRAM POSITIVE COCCI IN PAIRS IN CHAINS IN CLUSTERS ABUNDANT GRAM NEGATIVE RODS FEW GRAM NEGATIVE COCCOBACILLI Performed at [facility], [address], [place], [place] [address]    CULT (A) 03/28/2023    MULTIPLE ORGANISMS PRESENT, NONE PREDOMINANT NO STAPHYLOCOCCUS AUREUS ISOLATED NO GROUP A STREP (S.PYOGENES) ISOLATED MIXED ANAEROBIC FLORA PRESENT.  CALL LAB IF FURTHER IID REQUIRED.    LABORGA PROTEUS MIRABILIS 04/18/2021     Lab Results  Component Value Date   ALBUMIN 3.4 (L) 09/15/2023   ALBUMIN 2.5 (L) 03/31/2023   ALBUMIN 2.3 (L) 03/27/2023    Lab Results  Component Value Date   MG 1.6 (L) 03/31/2023   MG 1.9 03/18/2023   MG 1.6 (L) 03/17/2023   No results found for: VD25OH  No results found for: PREALBUMIN    Latest Ref Rng & Units 04/02/2024   12:41 AM  09/15/2023    6:05 PM 04/12/2023   11:45 PM  CBC EXTENDED  WBC 4.0 - 10.5 K/uL 8.0  7.1  6.6   RBC 4.22 - 5.81 MIL/uL 3.95  3.98  2.85   Hemoglobin 13.0 - 17.0 g/dL 87.5  87.6  9.1   HCT 60.9 - 52.0 % 37.2  37.5  28.8   Platelets 150 - 400 K/uL 229  216  263   NEUT# 1.7 - 7.7 K/uL   3.1   Lymph# 0.7 - 4.0 K/uL   2.3      There is no height or weight on file to calculate BMI.  Orders:  No orders of the defined types were placed in this encounter.  No orders of the defined types were placed in this encounter.    Procedures: No procedures performed  Clinical Data: No additional findings.  ROS:  All other systems  negative, except as noted in the HPI. Review of Systems  Objective: Vital Signs: There were no vitals taken for this visit.  Specialty Comments:  No specialty comments available.  PMFS History: Patient Active Problem List   Diagnosis Date Noted   Constipation 03/31/2023   Cutaneous abscess of right foot 03/28/2023   Coping style affecting medical condition 03/21/2023   Osteomyelitis of left lower extremity (HCC) 03/18/2023   Gangrene of left foot (HCC) 03/12/2023   Renal insufficiency 03/12/2023   PVD (peripheral vascular disease) (HCC) 03/12/2023   Hyponatremia 03/12/2023   Normocytic anemia 03/12/2023   Lymphedema 03/04/2023   Eye injury 11/21/2022   Hyperkalemia 07/10/2022   Chronic painful diabetic neuropathy (HCC) 07/10/2022   Metabolic acidosis, normal anion gap (NAG) 07/10/2022   Hypomagnesemia 07/10/2022   Charcot's arthropathy 06/28/2022   Leukocytosis    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)    Severe protein-calorie malnutrition (HCC)    Diabetic foot infection (HCC) 06/22/2022   Sepsis (HCC) 06/22/2022   Cellulitis in diabetic foot (HCC) 04/17/2021   Osteomyelitis of left foot (HCC) 04/14/2021   HTN (hypertension) 04/08/2021   HLD (hyperlipidemia) 04/08/2021   Alcohol abuse    Diabetic ulcer of right foot (HCC)    Diabetes mellitus type II, controlled (HCC) 01/27/2014   AKI (acute kidney injury) (HCC) 01/27/2014   Past Medical History:  Diagnosis Date   AKI (acute kidney injury) (HCC) 01/27/2014   Alcohol abuse    Anasarca    Bilateral leg edema 02/07/2021   Cellulitis    Cellulitis and abscess 01/26/2014   Cellulitis of right foot 04/17/2021   Dental abscess 01/26/2014   Diabetes mellitus type 2 in obese    Diabetic foot infection (HCC) 04/08/2021   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 2 diabetes mellitus, limited to breakdown of skin (HCC)    Facial cellulitis 01/26/2014    HTN (hypertension)    Hypokalemia 01/28/2014   Infected dental carries 01/26/2014   Leukocytosis 01/27/2014   Osteomyelitis (HCC) 04/14/2021   Right foot infection     History reviewed. No pertinent family history.  Past Surgical History:  Procedure Laterality Date   ABDOMINAL AORTOGRAM W/LOWER EXTREMITY N/A 03/06/2023   Procedure: ABDOMINAL AORTOGRAM W/LOWER EXTREMITY;  Surgeon: Gretta Lonni PARAS, MD;  Location: [facility];  Service: Cardiovascular;  Laterality: N/A;   AMPUTATION Left 03/15/2023   Procedure: AMPUTATION BELOW KNEE;  Surgeon: Harden Jerona GAILS, MD;  Location: [facility] OR;  Service: Orthopedics;  Laterality: Left;   I & D  EXTREMITY Right 04/18/2021   Procedure: IRRIGATION AND DEBRIDEMENT OF FOOT;  Surgeon: Harden Jerona GAILS, MD;  Location: [facility] OR;  Service: Orthopedics;  Laterality: Right;   I & D EXTREMITY Right 04/20/2021   Procedure: EXCISIONAL DEBRIDEMENT RIGHT FOOT, APPLICATION OF SKIN GRAFT;  Surgeon: Harden Jerona GAILS, MD;  Location: [facility] OR;  Service: Orthopedics;  Laterality: Right;   I & D EXTREMITY Right 03/28/2023   Procedure: RIGHT HEEL DEBRIDEMENT;  Surgeon: Harden Jerona GAILS, MD;  Location: [facility] OR;  Service: Orthopedics;  Laterality: Right;   PERIPHERAL VASCULAR INTERVENTION  03/06/2023   Procedure: PERIPHERAL VASCULAR INTERVENTION;  Surgeon: Gretta Lonni PARAS, MD;  Location: [facility];  Service: Cardiovascular;;   Social History   Occupational History   Occupation: Curator  Tobacco Use   Smoking status: Never   Smokeless tobacco: Never  Vaping Use   Vaping status: Never Used  Substance and Sexual Activity   Alcohol use: Not Currently    Alcohol/week: 2.0 standard drinks of alcohol    Types: 2 Cans of beer per week    Comment: Occasional   Drug use: No   Sexual activity: Not Currently    Partners: Female

## 2024-06-01 ENCOUNTER — Encounter (HOSPITAL_COMMUNITY): Payer: Self-pay

## 2024-06-01 ENCOUNTER — Other Ambulatory Visit: Payer: Self-pay

## 2024-06-01 ENCOUNTER — Emergency Department (HOSPITAL_COMMUNITY): Admission: EM | Admit: 2024-06-01 | Discharge: 2024-06-01 | Disposition: A | Discharge status: Home / Self Care

## 2024-06-01 ENCOUNTER — Other Ambulatory Visit (HOSPITAL_COMMUNITY): Payer: Self-pay

## 2024-06-01 DIAGNOSIS — Z7982 Long term (current) use of aspirin: Secondary | ICD-10-CM | POA: Insufficient documentation

## 2024-06-01 DIAGNOSIS — K047 Periapical abscess without sinus: Secondary | ICD-10-CM | POA: Insufficient documentation

## 2024-06-01 DIAGNOSIS — K0889 Other specified disorders of teeth and supporting structures: Secondary | ICD-10-CM | POA: Diagnosis present

## 2024-06-01 DIAGNOSIS — Z794 Long term (current) use of insulin: Secondary | ICD-10-CM | POA: Diagnosis not present

## 2024-06-01 MED ORDER — AMOXICILLIN-POT CLAVULANATE 875-125 MG PO TABS
1.0000 | ORAL_TABLET | Freq: Once | ORAL | Status: AC
  Administered 2024-06-01: 1 via ORAL
  Filled 2024-06-01: qty 1

## 2024-06-01 MED ORDER — AMOXICILLIN-POT CLAVULANATE 875-125 MG PO TABS
1.0000 | ORAL_TABLET | Freq: Two times a day (BID) | ORAL | 0 refills | Status: AC
  Filled 2024-06-01: qty 14, 7d supply, fill #0

## 2024-06-01 MED ORDER — KETOROLAC TROMETHAMINE 15 MG/ML IJ SOLN
15.0000 mg | Freq: Once | INTRAMUSCULAR | Status: AC
  Administered 2024-06-01: 15 mg via INTRAMUSCULAR
  Filled 2024-06-01: qty 1

## 2024-06-01 NOTE — ED Triage Notes (Signed)
 Pt c/o right sided facial swelling and mouth pain x 2 days.

## 2024-06-01 NOTE — ED Provider Notes (Signed)
 [place] EMERGENCY DEPARTMENT AT [facility] Provider Note   CSN: [id number] Arrival date & time: 06/01/24  1113     Patient presents with: Dental Pain   DEMPSY [name] is a 63 y.o. male.   63 year old male presents today for facial swelling.  Noticed dental pain 3 days ago.  Swelling started 2 days ago.  No fever, no drainage.  Does not have a dentist.  No other complaints.  No difficulty swallowing or with voice.  The history is provided by the patient. No language interpreter was used.       Prior to Admission medications   Medication Sig Start Date End Date Taking? Authorizing Provider  amoxicillin -clavulanate (AUGMENTIN ) 875-125 MG tablet Take 1 tablet by mouth every 12 (twelve) hours. 06/01/24  Yes Hildegard, Onix Jumper, PA-C  acetaminophen  (TYLENOL ) 325 MG tablet Take 1-2 tablets (325-650 mg total) by mouth every 6 (six) hours as needed for mild pain. 04/02/23   Setzer, Nena PARAS, PA-C  artificial tears (LACRILUBE) OINT ophthalmic ointment Place into the right eye every 4 (four) hours as needed for dry eyes. 04/02/23   Setzer, Sandra J, PA-C  ascorbic acid  (VITAMIN C ) 1000 MG tablet Take 1 tablet (1,000 mg total) by mouth daily. 03/19/23   Tobie Yetta HERO, MD  aspirin  EC 81 MG tablet Take 1 tablet (81 mg total) by mouth daily. Swallow whole. 03/10/23   Samtani, Jai-Gurmukh, MD  atorvastatin  (LIPITOR) 40 MG tablet Take 1 tablet (40 mg total) by mouth at bedtime. 03/10/23   Samtani, Jai-Gurmukh, MD  dorzolamide -timolol  (COSOPT ) 2-0.5 % ophthalmic solution Place 1 drop into the right eye 2 (two) times daily. 11/23/22   Simaan, Elizabeth S, PA-C  doxycycline  (VIBRA -TABS) 100 MG tablet Take 1 tablet (100 mg total) by mouth every 12 (twelve) hours. 04/02/23   Setzer, Sandra J, PA-C  feeding supplement, GLUCERNA SHAKE, (GLUCERNA SHAKE) LIQD Take 237 mLs by mouth 3 (three) times daily between meals. 03/18/23   Tobie Yetta HERO, MD  gabapentin  (NEURONTIN ) 100 MG capsule Take 1 capsule (100 mg  total) by mouth 3 (three) times daily. 03/18/23 04/17/23  Tobie Yetta HERO, MD  insulin  aspart (NOVOLOG ) 100 UNIT/ML injection Inject 0-9 Units into the skin 3 (three) times daily with meals. 04/02/23   Setzer, Sandra J, PA-C  insulin  glargine-yfgn (SEMGLEE ) 100 UNIT/ML injection Inject 0.05 mLs (5 Units total) into the skin at bedtime. 04/02/23   Setzer, Sandra J, PA-C  Insulin  Pen Needle 32G X 4 MM MISC Use as directed with insulin  pen 07/03/22   Rai, Ripudeep K, MD  magnesium  chloride (SLOW-MAG) 64 MG TBEC SR tablet Take 1 tablet (64 mg total) by mouth 2 (two) times daily. 04/02/23   Setzer, Sandra J, PA-C  Multiple Vitamin (MULTIVITAMIN WITH MINERALS) TABS tablet Take 1 tablet by mouth daily. 03/19/23   Tobie Yetta HERO, MD  nutrition supplement, JUVEN, RAWLEIGH) PACK Take 1 packet by mouth 2 (two) times daily between meals. 03/18/23   Tobie Yetta HERO, MD  oxyCODONE  (OXY IR/ROXICODONE ) 5 MG immediate release tablet Take 1 tablet (5 mg total) by mouth every 6 (six) hours as needed for severe pain (pain score 7-10). 04/02/23   Setzer, Nena PARAS, PA-C  pantoprazole  (PROTONIX ) 40 MG tablet Take 1 tablet (40 mg total) by mouth daily. 03/19/23   Patel, Pranav M, MD  polyethylene glycol (MIRALAX  / GLYCOLAX ) 17 g packet Take 17 g by mouth daily as needed for mild constipation. 03/18/23   Tobie Yetta HERO, MD  zinc  sulfate 220 (50 Zn) MG capsule Take 1 capsule (220 mg total) by mouth daily. 03/19/23   Tobie Yetta HERO, MD  glipiZIDE  (GLUCOTROL ) 5 MG tablet Take 1 tablet (5 mg total) by mouth daily before breakfast. Patient not taking: Reported on 01/14/2016 01/31/14 01/14/16  Floretta Bethanne CROME, MD    Allergies: Patient has no known allergies.    Review of Systems  Constitutional:  Negative for chills and fever.  HENT:  Positive for dental problem and facial swelling. Negative for drooling, trouble swallowing and voice change.   Respiratory:  Negative for cough and shortness of breath.   All other systems reviewed and are  negative.   Updated Vital Signs BP 134/77 (BP Location: Right Arm)   Pulse 89   Temp 98.4 F (36.9 C)   Resp 16   Ht 5' 11 (1.803 m)   Wt 112 kg   SpO2 94%   BMI 34.45 kg/m   Physical Exam Vitals and nursing note reviewed.  Constitutional:      General: He is not in acute distress.    Appearance: Normal appearance. He is not ill-appearing.  HENT:     Head: Normocephalic and atraumatic.     Nose: Nose normal.     Mouth/Throat:     Mouth: Mucous membranes are moist.     Pharynx: No oropharyngeal exudate or posterior oropharyngeal erythema.     Comments: External facial swelling noted on the right side.  No obvious dental abscess that requires drainage.  No peritonsillar abscess, retropharyngeal abscess, or Ludwig's angina. Eyes:     Conjunctiva/sclera: Conjunctivae normal.  Cardiovascular:     Rate and Rhythm: Normal rate and regular rhythm.  Pulmonary:     Effort: Pulmonary effort is normal. No respiratory distress.  Musculoskeletal:        General: No deformity. Normal range of motion.     Cervical back: Normal range of motion.  Skin:    Findings: No rash.  Neurological:     Mental Status: He is alert.     (all labs ordered are listed, but only abnormal results are displayed) Labs Reviewed - No data to display  EKG: None  Radiology: No results found.   Procedures   Medications Ordered in the ED  ketorolac  (TORADOL ) 15 MG/ML injection 15 mg (has no administration in time range)  amoxicillin -clavulanate (AUGMENTIN ) 875-125 MG per tablet 1 tablet (has no administration in time range)                                    Medical Decision Making Risk Prescription drug management.   63 year old male presents today for concern of dental pain and facial swelling.  Does not have a dentist.  External facial swelling noted.  No obvious dental abscess noted within.  Normal speech.  No trismus.  No retropharyngeal abscess,, Ludwig's angina on exam. Dental  referral given.  Discharged in stable condition.  Return precaution discussed.  Patient voices understanding and is in agreement with plan.  PCP referral given as well.    Final diagnoses:  Dental abscess    ED Discharge Orders          Ordered    amoxicillin -clavulanate (AUGMENTIN ) 875-125 MG tablet  Every 12 hours        06/01/24 1332               Hildegard Loge, PA-C 06/01/24 1340  Neysa Caron PARAS, DO 06/01/24 1453

## 2024-06-01 NOTE — Discharge Instructions (Addendum)
 Antibiotic prescribed for you.  No abscess visible on exam that needs to be drained right now.  Follow-up with a dentist.  Information listed above.  Also establish a primary care doctor.  Return for emergent symptoms.

## 2024-06-03 ENCOUNTER — Ambulatory Visit: Admitting: Orthopedic Surgery

## 2024-07-30 ENCOUNTER — Ambulatory Visit: Admitting: Family

## 2024-08-03 ENCOUNTER — Other Ambulatory Visit (HOSPITAL_COMMUNITY): Payer: Self-pay
